# Patient Record
Sex: Male | Born: 1970
Health system: Southern US, Community
[De-identification: ages and names within clinical notes are randomized; demographics above are authoritative.]

## PROBLEM LIST (undated history)

## (undated) DIAGNOSIS — Z992 Dependence on renal dialysis: Secondary | ICD-10-CM

## (undated) DIAGNOSIS — N289 Disorder of kidney and ureter, unspecified: Secondary | ICD-10-CM

## (undated) DIAGNOSIS — K7460 Unspecified cirrhosis of liver: Secondary | ICD-10-CM

## (undated) DIAGNOSIS — K08109 Complete loss of teeth, unspecified cause, unspecified class: Secondary | ICD-10-CM

## (undated) DIAGNOSIS — I340 Nonrheumatic mitral (valve) insufficiency: Secondary | ICD-10-CM

## (undated) DIAGNOSIS — I10 Essential (primary) hypertension: Secondary | ICD-10-CM

## (undated) DIAGNOSIS — E8770 Fluid overload, unspecified: Secondary | ICD-10-CM

## (undated) DIAGNOSIS — E875 Hyperkalemia: Secondary | ICD-10-CM

## (undated) DIAGNOSIS — I5042 Chronic combined systolic (congestive) and diastolic (congestive) heart failure: Secondary | ICD-10-CM

## (undated) DIAGNOSIS — J811 Chronic pulmonary edema: Secondary | ICD-10-CM

## (undated) DIAGNOSIS — N492 Inflammatory disorders of scrotum: Secondary | ICD-10-CM

## (undated) DIAGNOSIS — R109 Unspecified abdominal pain: Secondary | ICD-10-CM

## (undated) DIAGNOSIS — N186 End stage renal disease: Secondary | ICD-10-CM

## (undated) DIAGNOSIS — R079 Chest pain, unspecified: Secondary | ICD-10-CM

## (undated) HISTORY — PX: AV FISTULA PLACEMENT: SHX1204

## (undated) HISTORY — DX: Essential (primary) hypertension: I10

## (undated) HISTORY — DX: Inflammatory disorders of scrotum: N49.2

## (undated) HISTORY — DX: Fluid overload, unspecified: E87.70

## (undated) HISTORY — DX: Chronic pulmonary edema: J81.1

## (undated) HISTORY — DX: Hyperkalemia: E87.5

## (undated) HISTORY — DX: Unspecified abdominal pain: R10.9

## (undated) HISTORY — DX: End stage renal disease: N18.6

## (undated) HISTORY — DX: Dependence on renal dialysis: Z99.2

## (undated) HISTORY — DX: Chest pain, unspecified: R07.9

---

## 2015-01-25 ENCOUNTER — Inpatient Hospital Stay
Admission: EM | Admit: 2015-01-25 | Discharge: 2015-01-28 | DRG: 727 | Disposition: A | Discharge status: Home / Self Care | Payer: Medicaid Other | Attending: Internal Medicine | Admitting: Internal Medicine

## 2015-01-25 ENCOUNTER — Encounter: Payer: Self-pay | Admitting: Urgent Care

## 2015-01-25 ENCOUNTER — Emergency Department: Payer: Medicaid Other

## 2015-01-25 DIAGNOSIS — Z9889 Other specified postprocedural states: Secondary | ICD-10-CM

## 2015-01-25 DIAGNOSIS — E871 Hypo-osmolality and hyponatremia: Secondary | ICD-10-CM | POA: Diagnosis present

## 2015-01-25 DIAGNOSIS — N508 Other specified disorders of male genital organs: Secondary | ICD-10-CM | POA: Diagnosis present

## 2015-01-25 DIAGNOSIS — I120 Hypertensive chronic kidney disease with stage 5 chronic kidney disease or end stage renal disease: Secondary | ICD-10-CM | POA: Diagnosis present

## 2015-01-25 DIAGNOSIS — F17210 Nicotine dependence, cigarettes, uncomplicated: Secondary | ICD-10-CM | POA: Diagnosis present

## 2015-01-25 DIAGNOSIS — N492 Inflammatory disorders of scrotum: Secondary | ICD-10-CM

## 2015-01-25 DIAGNOSIS — Z79899 Other long term (current) drug therapy: Secondary | ICD-10-CM | POA: Diagnosis not present

## 2015-01-25 DIAGNOSIS — R609 Edema, unspecified: Secondary | ICD-10-CM

## 2015-01-25 DIAGNOSIS — N186 End stage renal disease: Secondary | ICD-10-CM | POA: Diagnosis present

## 2015-01-25 DIAGNOSIS — Z841 Family history of disorders of kidney and ureter: Secondary | ICD-10-CM | POA: Diagnosis not present

## 2015-01-25 DIAGNOSIS — Z992 Dependence on renal dialysis: Secondary | ICD-10-CM

## 2015-01-25 DIAGNOSIS — D631 Anemia in chronic kidney disease: Secondary | ICD-10-CM | POA: Diagnosis present

## 2015-01-25 DIAGNOSIS — N2581 Secondary hyperparathyroidism of renal origin: Secondary | ICD-10-CM | POA: Diagnosis present

## 2015-01-25 HISTORY — DX: Disorder of kidney and ureter, unspecified: N28.9

## 2015-01-25 HISTORY — DX: Inflammatory disorders of scrotum: N49.2

## 2015-01-25 HISTORY — DX: End stage renal disease: N18.6

## 2015-01-25 HISTORY — DX: Dependence on renal dialysis: Z99.2

## 2015-01-25 LAB — CBC WITH DIFFERENTIAL/PLATELET
Basophils Absolute: 0 10*3/uL (ref 0–0.1)
Basophils Relative: 0 %
Eosinophils Absolute: 1.7 10*3/uL — ABNORMAL HIGH (ref 0–0.7)
Eosinophils Relative: 15 %
HCT: 37.3 % — ABNORMAL LOW (ref 40.0–52.0)
HEMOGLOBIN: 12.6 g/dL — AB (ref 13.0–18.0)
LYMPHS ABS: 0.9 10*3/uL — AB (ref 1.0–3.6)
Lymphocytes Relative: 8 %
MCH: 33.7 pg (ref 26.0–34.0)
MCHC: 33.7 g/dL (ref 32.0–36.0)
MCV: 99.9 fL (ref 80.0–100.0)
MONOS PCT: 9 %
Monocytes Absolute: 1 10*3/uL (ref 0.2–1.0)
NEUTROS ABS: 7.9 10*3/uL — AB (ref 1.4–6.5)
NEUTROS PCT: 68 %
Platelets: 107 10*3/uL — ABNORMAL LOW (ref 150–440)
RBC: 3.73 MIL/uL — AB (ref 4.40–5.90)
RDW: 14.8 % — ABNORMAL HIGH (ref 11.5–14.5)
WBC: 11.5 10*3/uL — ABNORMAL HIGH (ref 3.8–10.6)

## 2015-01-25 LAB — COMPREHENSIVE METABOLIC PANEL
ALT: 20 U/L (ref 17–63)
AST: 21 U/L (ref 15–41)
Albumin: 4 g/dL (ref 3.5–5.0)
Alkaline Phosphatase: 174 U/L — ABNORMAL HIGH (ref 38–126)
Anion gap: 17 — ABNORMAL HIGH (ref 5–15)
BUN: 54 mg/dL — AB (ref 6–20)
CALCIUM: 9.3 mg/dL (ref 8.9–10.3)
CHLORIDE: 87 mmol/L — AB (ref 101–111)
CO2: 26 mmol/L (ref 22–32)
CREATININE: 11.72 mg/dL — AB (ref 0.61–1.24)
GFR calc non Af Amer: 5 mL/min — ABNORMAL LOW (ref >60)
GFR, EST AFRICAN AMERICAN: 5 mL/min — AB (ref >60)
Glucose, Bld: 98 mg/dL (ref 65–99)
Potassium: 4.4 mmol/L (ref 3.5–5.1)
Sodium: 130 mmol/L — ABNORMAL LOW (ref 135–145)
Total Bilirubin: 1.8 mg/dL — ABNORMAL HIGH (ref 0.3–1.2)
Total Protein: 8.1 g/dL (ref 6.5–8.1)

## 2015-01-25 MED ORDER — SODIUM CHLORIDE 0.9 % IV SOLN
3.0000 g | Freq: Four times a day (QID) | INTRAVENOUS | Status: DC
  Filled 2015-01-25 (×5): qty 3

## 2015-01-25 MED ORDER — HEPARIN SODIUM (PORCINE) 5000 UNIT/ML IJ SOLN
5000.0000 [IU] | Freq: Three times a day (TID) | INTRAMUSCULAR | Status: DC
  Administered 2015-01-25 – 2015-01-28 (×8): 5000 [IU] via SUBCUTANEOUS
  Filled 2015-01-25 (×8): qty 1

## 2015-01-25 MED ORDER — HYDROMORPHONE HCL 1 MG/ML IJ SOLN
1.0000 mg | Freq: Once | INTRAMUSCULAR | Status: AC
  Administered 2015-01-25: 1 mg via INTRAVENOUS

## 2015-01-25 MED ORDER — ACETAMINOPHEN 650 MG RE SUPP
650.0000 mg | Freq: Four times a day (QID) | RECTAL | Status: DC | PRN
Start: 2015-01-25 — End: 2015-01-28

## 2015-01-25 MED ORDER — HYDROCODONE-ACETAMINOPHEN 5-325 MG PO TABS
1.0000 | ORAL_TABLET | ORAL | Status: DC | PRN
  Administered 2015-01-25 – 2015-01-26 (×5): 2 via ORAL
  Administered 2015-01-27: 1 via ORAL
  Administered 2015-01-28: 2 via ORAL
  Filled 2015-01-25: qty 1
  Filled 2015-01-25: qty 2
  Filled 2015-01-25: qty 1
  Filled 2015-01-25 (×5): qty 2

## 2015-01-25 MED ORDER — HYDROMORPHONE HCL 1 MG/ML IJ SOLN
INTRAMUSCULAR | Status: AC
  Administered 2015-01-25: 1 mg via INTRAVENOUS
  Filled 2015-01-25: qty 1

## 2015-01-25 MED ORDER — MORPHINE SULFATE 2 MG/ML IJ SOLN
1.0000 mg | INTRAMUSCULAR | Status: DC | PRN
  Administered 2015-01-25 – 2015-01-28 (×9): 2 mg via INTRAVENOUS
  Filled 2015-01-25 (×10): qty 1

## 2015-01-25 MED ORDER — VANCOMYCIN HCL IN DEXTROSE 1-5 GM/200ML-% IV SOLN
1000.0000 mg | Freq: Once | INTRAVENOUS | Status: DC
  Filled 2015-01-25: qty 200

## 2015-01-25 MED ORDER — SODIUM CHLORIDE 0.9 % IV SOLN: INTRAVENOUS | Status: AC

## 2015-01-25 MED ORDER — ACETAMINOPHEN 325 MG PO TABS: 650.0000 mg | ORAL_TABLET | Freq: Four times a day (QID) | ORAL | Status: DC | PRN

## 2015-01-25 MED ORDER — CLINDAMYCIN PHOSPHATE 600 MG/50ML IV SOLN
600.0000 mg | Freq: Once | INTRAVENOUS | Status: AC
  Administered 2015-01-25: 600 mg via INTRAVENOUS

## 2015-01-25 MED ORDER — AMPICILLIN-SULBACTAM SODIUM 3 (2-1) G IJ SOLR
3.0000 g | INTRAMUSCULAR | Status: DC
  Administered 2015-01-25 – 2015-01-26 (×2): 3 g via INTRAVENOUS
  Filled 2015-01-25 (×3): qty 3

## 2015-01-25 MED ORDER — VANCOMYCIN HCL IN DEXTROSE 750-5 MG/150ML-% IV SOLN
750.0000 mg | INTRAVENOUS | Status: DC
  Administered 2015-01-27: 750 mg via INTRAVENOUS
  Filled 2015-01-25: qty 150

## 2015-01-25 MED ORDER — VANCOMYCIN HCL 10 G IV SOLR
1500.0000 mg | Freq: Once | INTRAVENOUS | Status: AC
  Administered 2015-01-25: 1500 mg via INTRAVENOUS
  Filled 2015-01-25: qty 1500

## 2015-01-25 MED ORDER — ONDANSETRON HCL 4 MG/2ML IJ SOLN
4.0000 mg | Freq: Four times a day (QID) | INTRAMUSCULAR | Status: DC | PRN
  Administered 2015-01-28: 4 mg via INTRAVENOUS
  Filled 2015-01-25: qty 2

## 2015-01-25 MED ORDER — CLINDAMYCIN PHOSPHATE 600 MG/50ML IV SOLN
INTRAVENOUS | Status: AC
  Administered 2015-01-25: 600 mg via INTRAVENOUS
  Filled 2015-01-25: qty 50

## 2015-01-25 MED ORDER — ONDANSETRON HCL 4 MG PO TABS: 4.0000 mg | ORAL_TABLET | Freq: Four times a day (QID) | ORAL | Status: DC | PRN

## 2015-01-25 NOTE — H&P (Signed)
[facility] [place] NAME: Mario [name]    MR#:  [id number]  DATE OF BIRTH:  1971-01-30  DATE OF ADMISSION:  01/25/2015  PRIMARY CARE PHYSICIAN:[facility]  REQUESTING/REFERRING PHYSICIAN: Lenise [name]  CHIEF COMPLAINT:   Chief Complaint  Patient presents with  . Abscess    HISTORY OF PRESENT ILLNESS: Mario [name]  is a 44 y.o. male with a known history of  end-stage renal disease on dialysis on Tuesday Thursday and Saturday. Patient reports that he developed a scrotal swelling approximately 3 weeks ago but still really started hurting 3 days ago. Came to the ED with the symptoms. Patient had a ultrasound of the scrotal area which showed a 2.1 cm heterogeneous hypoechoic collection compatible with resolving abscesses. Patient does have an area of a boil on his scrotum that is draining some pus. He also complains of warmth and pain in that area. Has had intermittent fevers and chills denies any chest pain or palpitations PAST MEDICAL HISTORY:   Past Medical History  Diagnosis Date  . Renal disorder   . Dialysis patient     PAST SURGICAL HISTORY:  Past Surgical History  Procedure Laterality Date  . Av fistula placement      SOCIAL HISTORY:  History  Substance Use Topics  . Smoking status: Current Every Day Smoker -- 0.25 packs/day    Types: Cigarettes  . Smokeless tobacco: Not on file  . Alcohol Use: No    FAMILY HISTORY:  Family History  Problem Relation Age of Onset  . Kidney failure Father     DRUG ALLERGIES: No Known Allergies  REVIEW OF SYSTEMS:   CONSTITUTIONAL: Positive fever, fatigue or weakness.  EYES: No blurred or double vision.  EARS, NOSE, AND THROAT: No tinnitus or ear pain.  RESPIRATORY: No cough, shortness of breath, wheezing or hemoptysis.  CARDIOVASCULAR: No chest pain, orthopnea, edema.  GASTROINTESTINAL: No nausea, vomiting, diarrhea or abdominal pain.   GENITOURINARY: No dysuria, hematuria.  ENDOCRINE: No polyuria, nocturia,  HEMATOLOGY: No anemia, easy bruising or bleeding SKIN: No rash or lesion. Scrotal swelling MUSCULOSKELETAL: No joint pain or arthritis.   NEUROLOGIC: No tingling, numbness, weakness.  PSYCHIATRY: No anxiety or depression.   MEDICATIONS AT HOME:  Prior to Admission medications   Medication Sig Start Date End Date Taking? Authorizing Provider  calcium acetate (PHOSLO) 667 MG tablet Take 667 mg by mouth 3 (three) times daily with meals.   Yes Historical Provider, MD  lanthanum (FOSRENOL) 1000 MG chewable tablet Chew 1,000 mg by mouth daily.   Yes Historical Provider, MD  Multiple Vitamin (RENAL MULTIVITAMIN/ZINC PO) Take 1 tablet by mouth daily.   Yes Historical Provider, MD      PHYSICAL EXAMINATION:   VITAL SIGNS: Blood pressure 131/73, pulse 81, temperature 98.3 F (36.8 C), temperature source Oral, resp. rate 18, weight 72.576 kg (160 lb), SpO2 100 %.  GENERAL:  44 y.o.-year-old patient lying in the bed with no acute distress.  EYES: Pupils equal, round, reactive to light and accommodation. No scleral icterus. Extraocular muscles intact.  HEENT: Head atraumatic, normocephalic. Oropharynx and nasopharynx clear.  NECK:  Supple, no jugular venous distention. No thyroid enlargement, no tenderness.  LUNGS: Normal breath sounds bilaterally, no wheezing, rales,rhonchi or crepitation. No use of accessory muscles of respiration.  CARDIOVASCULAR: S1, S2 normal. No murmurs, rubs, or gallops.  ABDOMEN: Soft, nontender, nondistended. Bowel sounds present. No organomegaly or mass.  EXTREMITIES: No pedal edema,  cyanosis, or clubbing.  NEUROLOGIC: Cranial nerves II through XII are intact. Muscle strength 5/5 in all extremities. Sensation intact. Gait not checked.  PSYCHIATRIC: The patient is alert and oriented x 3.  SKIN: No obvious rash, lesion, or ulcer. GU: Has diffuse swelling of his scrotum, with area of boil that is  open and draining some pus there is warmth   LABORATORY PANEL:   CBC  Recent Labs Lab 01/25/15 0846  WBC 11.5*  HGB 12.6*  HCT 37.3*  PLT 107*  MCV 99.9  MCH 33.7  MCHC 33.7  RDW 14.8*  LYMPHSABS 0.9*  MONOABS 1.0  EOSABS 1.7*  BASOSABS 0.0   ------------------------------------------------------------------------------------------------------------------  Chemistries   Recent Labs Lab 01/25/15 0846  NA 130*  K 4.4  CL 87*  CO2 26  GLUCOSE 98  BUN 54*  CREATININE 11.72*  CALCIUM 9.3  AST 21  ALT 20  ALKPHOS 174*  BILITOT 1.8*   ------------------------------------------------------------------------------------------------------------------ CrCl cannot be calculated (Unknown ideal weight.). ------------------------------------------------------------------------------------------------------------------ No results for input(s): TSH, T4TOTAL, T3FREE, THYROIDAB in the last 72 hours.  Invalid input(s): FREET3   Coagulation profile No results for input(s): INR, PROTIME in the last 168 hours. ------------------------------------------------------------------------------------------------------------------- No results for input(s): DDIMER in the last 72 hours. -------------------------------------------------------------------------------------------------------------------  Cardiac Enzymes No results for input(s): CKMB, TROPONINI, MYOGLOBIN in the last 168 hours.  Invalid input(s): CK ------------------------------------------------------------------------------------------------------------------ Invalid input(s): POCBNP  ---------------------------------------------------------------------------------------------------------------  Urinalysis No results found for: COLORURINE, APPEARANCEUR, LABSPEC, PHURINE, GLUCOSEU, HGBUR, BILIRUBINUR, KETONESUR, PROTEINUR, UROBILINOGEN, NITRITE, LEUKOCYTESUR   RADIOLOGY: US Scrotum  01/25/2015   CLINICAL DATA:   Palpable nodule. Scrotal abscess. The patient states he has been treated with antibiotics.  EXAM: SCROTAL ULTRASOUND  DOPPLER ULTRASOUND OF THE TESTICLES  TECHNIQUE: Complete ultrasound examination of the testicles, epididymis, and other scrotal structures was performed. Color and spectral Doppler ultrasound were also utilized to evaluate blood flow to the testicles.  COMPARISON:  None.  FINDINGS: Right testicle  Measurements: 3.7 x 1.8 x 2.8 cm, within normal limits. No mass or microlithiasis visualized.  Left testicle  Measurements: 4.0 x 2.1 x 2.9 cm, within normal limits. No mass or microlithiasis visualized.  Right epididymis:  Normal in size and appearance.  Left epididymis:  Normal in size and appearance.  Hydrocele:  A small right hydrocele is noted.  Varicocele:  None visualized.  Pulsed Doppler interrogation of both testes demonstrates normal low resistance arterial and venous waveforms bilaterally.  Lateral to the right test skull is a heterogeneous to hypoechoic collection measuring at 2.1 x 1.1 x 1.4 cm. There is marked thickening of the scrotal wall. There is no significant increased flow about the collection.  IMPRESSION: 1. Normal appearance of the testicles bilaterally. Color Doppler evaluation and waveforms are normal. 2. Small right hydrocele.  This may be reactive. 3. 2.1 cm heterogeneous hypoechoic collection compatible with a resolving abscess. There is no significant hyperemia surrounding the area. 4. Scrotal wall thickening bilaterally is likely reactive.   Electronically Signed   By: San Morelle M.D.   On: 01/25/2015 10:19   [place] Art/ven Flow Abd Pelv Doppler  01/25/2015   CLINICAL DATA:  Palpable nodule. Scrotal abscess. The patient states he has been treated with antibiotics.  EXAM: SCROTAL ULTRASOUND  DOPPLER ULTRASOUND OF THE TESTICLES  TECHNIQUE: Complete ultrasound examination of the testicles, epididymis, and other scrotal structures was performed. Color and spectral Doppler  ultrasound were also utilized to evaluate blood flow to the testicles.  COMPARISON:  None.  FINDINGS: Right testicle  Measurements:  3.7 x 1.8 x 2.8 cm, within normal limits. No mass or microlithiasis visualized.  Left testicle  Measurements: 4.0 x 2.1 x 2.9 cm, within normal limits. No mass or microlithiasis visualized.  Right epididymis:  Normal in size and appearance.  Left epididymis:  Normal in size and appearance.  Hydrocele:  A small right hydrocele is noted.  Varicocele:  None visualized.  Pulsed Doppler interrogation of both testes demonstrates normal low resistance arterial and venous waveforms bilaterally.  Lateral to the right test skull is a heterogeneous to hypoechoic collection measuring at 2.1 x 1.1 x 1.4 cm. There is marked thickening of the scrotal wall. There is no significant increased flow about the collection.  IMPRESSION: 1. Normal appearance of the testicles bilaterally. Color Doppler evaluation and waveforms are normal. 2. Small right hydrocele.  This may be reactive. 3. 2.1 cm heterogeneous hypoechoic collection compatible with a resolving abscess. There is no significant hyperemia surrounding the area. 4. Scrotal wall thickening bilaterally is likely reactive.   Electronically Signed   By: San Morelle M.D.   On: 01/25/2015 10:19    EKG: No orders found for this or any previous visit.  IMPRESSION AND PLAN: Patient is a 44 year old Spanish-speaking male presents with scrotal swelling  1. Scrotal swelling due to scrotal cellulitis associated with abscess. At this time will treat him with aggressive anabiotic's with IV vancomycin and Unasyn. It is already draining on his own if he does not improve then we can ask urology to see the patient.  2. End-stage renal disease: Nephrology consult for hemodialysis  3. Miscellaneous heparin for DVT prophylaxis     All the records are reviewed and case discussed with ED provider. Management plans discussed with the patient,  family and they are in agreement.  CODE STATUS: Full    TOTAL TIME TAKING CARE OF THIS PATIENT: 55 minutes.  Spanish interpreter use to communicate with the patient   Dustin Flock M.D on 01/25/2015 at 11:34 AM  Between 7am to 6pm - Pager - [phone]  After 6pm go to [email or web address] - password [email or web address] [facility]  [facility]  Office  [phone]  CC: Primary care physician; No PCP Per Patient

## 2015-01-25 NOTE — Progress Notes (Addendum)
ANTIBIOTIC CONSULT NOTE - INITIAL  Pharmacy Consult for Vancomycin/Unasyn Indication: scrotal cellulitis/abscess  No Known Allergies  Patient Measurements: Height: 5' (152.4 cm) Weight: 172 lb 9.6 oz (78.291 kg) IBW/kg (Calculated) : 50 Adjusted Body Weight: 61kg  Labs:  Recent Labs  01/25/15 0846  WBC 11.5*  HGB 12.6*  PLT 107*  CREATININE 11.72*   Estimated Creatinine Clearance: 7 mL/min (by C-G formula based on Cr of 11.72).   Microbiology: No results found for this or any previous visit (from the past 720 hour(s)).  Medical History: Past Medical History  Diagnosis Date  . Renal disorder   . Dialysis patient     Medications:  Anti-infectives    Start     Dose/Rate Route Frequency Ordered Stop   01/27/15 1200  vancomycin (VANCOCIN) IVPB 750 mg/150 ml premix     750 mg 150 mL/hr over 60 Minutes Intravenous Every T-Th-Sa (Hemodialysis) 01/25/15 1523     01/25/15 1800  Ampicillin-Sulbactam (UNASYN) 3 g in sodium chloride 0.9 % 100 mL IVPB     3 g 100 mL/hr over 60 Minutes Intravenous Every 24 hours 01/25/15 1526     01/25/15 1600  vancomycin (VANCOCIN) 1,500 mg in sodium chloride 0.9 % 500 mL IVPB     1,500 mg 250 mL/hr over 120 Minutes Intravenous  Once 01/25/15 1523     01/25/15 1300  vancomycin (VANCOCIN) IVPB 1000 mg/200 mL premix  Status:  Discontinued     1,000 mg 200 mL/hr over 60 Minutes Intravenous  Once 01/25/15 1228 01/25/15 1515   01/25/15 1130  Ampicillin-Sulbactam (UNASYN) 3 g in sodium chloride 0.9 % 100 mL IVPB  Status:  Discontinued     3 g 100 mL/hr over 60 Minutes Intravenous Every 6 hours 01/25/15 1129 01/25/15 1526   01/25/15 0900  clindamycin (CLEOCIN) IVPB 600 mg     600 mg 100 mL/hr over 30 Minutes Intravenous  Once 01/25/15 0859 01/25/15 0941     Assessment: Pharmacy consulted to dose vancomycin for scrotal abscess/cellulitis in this 44 year old male with ESRD on HD.   Patient on T,Th,Sat HD schedule as an outpatient. Unclear when  next HD session is scheduled.  Plan:  Vancomycin 1500mg  IV x 1 ordered followed by 750mg  IV QHD. Current orders for TThSat schedule, will need to follow and adjust schedule as needed. Will check trough prior to third planned HD session. Target trough 15-83mcg/ml  Current orders for unasyn 3gm IV Q6H. Will adjust per policy for renal function to unasyn 3gm IV Q24H with dose to be given after HD on dialysis days.  Pharmacy to follow per consult  Rexene Edison, PharmD Clinical Pharmacist  01/25/2015,3:29 PM

## 2015-01-25 NOTE — ED Notes (Signed)
Patient presents with reports of an abscess to his RIGHT groin x 3 weeks; worse over the last 3 days. Denies drainage. (+) fever at home.

## 2015-01-25 NOTE — ED Notes (Signed)
Pt arrives with complaints of testicle pain for 3 weeks, pt in intense pain, upon assessment testicle large and inflammed, Ronnald Collum spanish interpreter at bedside, pt on dialysis, fistula Left upper arm- tue, thur, sat, pt states he does not make urine, pt states he was treated for abscess in testicle in [place] and given abx

## 2015-01-25 NOTE — ED Provider Notes (Signed)
[facility] Emergency Department Provider Note  ____________________________________________  Time seen: 0830  I have reviewed the triage vital signs and the nursing notes.   HISTORY  Chief Complaint Abscess  Patient is primarily a Spanish speaker information was given through an interpreter  HPI Mario [name] is a 44 y.o. male that arrives here today with swelling to his scrotum states that he believes he has an abscessed gotten worse over the last 3 weeks growing of the last 2 days states the pain has been unbearable used prescribing fevers sweats chills at home is also dialysis patient didn't make it to dialysis rates his pain as a 10 out of 10 nothing making it better or worse denies any other complaints at this time    Past Medical History  Diagnosis Date  . Renal disorder   . Dialysis patient     Patient Active Problem List   Diagnosis Date Noted  . Scrotal infection 01/25/2015  . ESRD (end stage renal disease) 01/25/2015    Past Surgical History  Procedure Laterality Date  . Av fistula placement      Current Outpatient Rx  Name  Route  Sig  Dispense  Refill  . calcium acetate (PHOSLO) 667 MG tablet   Oral   Take 667 mg by mouth 3 (three) times daily with meals.         Marland Kitchen lanthanum (FOSRENOL) 1000 MG chewable tablet   Oral   Chew 1,000 mg by mouth daily.         . Multiple Vitamin (RENAL MULTIVITAMIN/ZINC PO)   Oral   Take 1 tablet by mouth daily.           Allergies Review of patient's allergies indicates no known allergies.  Family History  Problem Relation Age of Onset  . Kidney failure Father     Social History History  Substance Use Topics  . Smoking status: Current Every Day Smoker -- 0.25 packs/day    Types: Cigarettes  . Smokeless tobacco: Not on file  . Alcohol Use: No    Review of Systems Constitutional:  fever/chills Eyes: No visual changes. ENT: No sore throat. Cardiovascular: Denies chest  pain. Respiratory: Denies shortness of breath. Gastrointestinal: No abdominal pain.  No nausea, no vomiting.  No diarrhea.  No constipation. Genitourinary: Negative for dysuria. Musculoskeletal: Negative for back pain. Skin: Negative for rash. Neurological: Negative for headaches, focal weakness or numbness.  10-point ROS otherwise negative.  ____________________________________________   PHYSICAL EXAM:  VITAL SIGNS: ED Triage Vitals  Enc Vitals Group     BP 01/25/15 0653 147/78 mmHg     Pulse Rate 01/25/15 0653 78     Resp 01/25/15 0653 18     Temp 01/25/15 0653 98.3 F (36.8 C)     Temp Source 01/25/15 0653 Oral     SpO2 01/25/15 0653 100 %     Weight 01/25/15 0653 160 lb (72.576 kg)     Height --      Head Cir --      Peak Flow --      Pain Score 01/25/15 0654 10     Pain Loc --      Pain Edu? --      Excl. in [place]? --     Constitutional: Alert and oriented. Well appearing and in no acute distress. Eyes: Conjunctivae are normal. PERRL. EOMI. Head: Atraumatic. Nose: No congestion/rhinnorhea. Mouth/Throat: Mucous membranes are moist.  Oropharynx non-erythematous. Neck: No stridor.   Cardiovascular:  Normal rate, regular rhythm. Grossly normal heart sounds.  Good peripheral circulation. Respiratory: Normal respiratory effort.  No retractions. Lungs CTAB. Gastrointestinal: Soft and nontender. No distention. No abdominal bruits. No CVA tenderness. Genitourinary: Large swelling of the scrotum warm red tenderness palpable fluid collection Musculoskeletal: No lower extremity tenderness nor edema.  No joint effusions. Patient has fistula in his upper left arm Neurologic:  Normal speech and language. No gross focal neurologic deficits are appreciated. Speech is normal. No gait instability. Skin:  Skin is warm, dry and intact. No rash noted. Psychiatric: Mood and affect are normal. Speech and behavior are normal.  ____________________________________________   LABS (all labs  ordered are listed, but only abnormal results are displayed)  Labs Reviewed  CBC WITH DIFFERENTIAL/PLATELET - Abnormal; Notable for the following:    WBC 11.5 (*)    RBC 3.73 (*)    Hemoglobin 12.6 (*)    HCT 37.3 (*)    RDW 14.8 (*)    Platelets 107 (*)    Neutro Abs 7.9 (*)    Lymphs Abs 0.9 (*)    Eosinophils Absolute 1.7 (*)    All other components within normal limits  COMPREHENSIVE METABOLIC PANEL - Abnormal; Notable for the following:    Sodium 130 (*)    Chloride 87 (*)    BUN 54 (*)    Creatinine, Ser 11.72 (*)    Alkaline Phosphatase 174 (*)    Total Bilirubin 1.8 (*)    GFR calc non Af Amer 5 (*)    GFR calc Af Amer 5 (*)    Anion gap 17 (*)    All other components within normal limits  CULTURE, BLOOD (ROUTINE X 2)  CULTURE, BLOOD (ROUTINE X 2)    RADIOLOGY  IMPRESSION: 1. Normal appearance of the testicles bilaterally. Color Doppler evaluation and waveforms are normal. 2. Small right hydrocele. This may be reactive. 3. 2.1 cm heterogeneous hypoechoic collection compatible with a resolving abscess. There is no significant hyperemia surrounding the area. 4. Scrotal wall thickening bilaterally is likely reactive.   Electronically Signed By: San Morelle M.D. On: 01/25/2015 10:19 ____________________________________________   PROCEDURES  Procedure(s) performed: None  Critical Care performed: No  ____________________________________________   INITIAL IMPRESSION / ASSESSMENT AND PLAN / ED COURSE  Pertinent labs & imaging results that were available during my care of the patient were reviewed by me and considered in my medical decision making (see chart for details).  Initial impression on this patient scrotal abscess and cellulitis patient has had this problem before states he required IV antibiotics and admission is also a dialysis patient who was unable to go to dialysis due to this condition given all these factors we have consulted  the hospitalist service for evaluation for admission on the department he received 2 mg of Dilaudid 600 mg of clindamycin plan for this patient is evaluation for admission ____________________________________________   FINAL CLINICAL IMPRESSION(S) / ED DIAGNOSES  Final diagnoses:  Swelling  Cellulitis of scrotum  Abscess of scrotum     Crystalmarie Yasin Verdene Rio, PA-C 01/25/15 1210  Earleen Newport, MD 01/25/15 1425

## 2015-01-25 NOTE — ED Provider Notes (Signed)
Patient was seen by me, has obvious scrotal infection, possible abscess. On exam there is a tender area on the right hemiscrotum concerning for abscess, but ultrasound does not show a drainable abscess at this time. He was given IV antibiotics and due to his significant medical problems would benefit from IV antibiotics and admission. He may need urology consultation if not improving.  Medical screening examination/treatment/procedure(s) were performed by non-physician practitioner and as supervising physician I was immediately available for consultation/collaboration.    Earleen Newport, MD 01/25/15 [phone]

## 2015-01-26 LAB — BASIC METABOLIC PANEL
Anion gap: 15 (ref 5–15)
BUN: 69 mg/dL — AB (ref 6–20)
CO2: 25 mmol/L (ref 22–32)
CREATININE: 13.71 mg/dL — AB (ref 0.61–1.24)
Calcium: 8.8 mg/dL — ABNORMAL LOW (ref 8.9–10.3)
Chloride: 89 mmol/L — ABNORMAL LOW (ref 101–111)
GFR calc Af Amer: 4 mL/min — ABNORMAL LOW (ref >60)
GFR calc non Af Amer: 4 mL/min — ABNORMAL LOW (ref >60)
Glucose, Bld: 90 mg/dL (ref 65–99)
Potassium: 5.2 mmol/L — ABNORMAL HIGH (ref 3.5–5.1)
Sodium: 129 mmol/L — ABNORMAL LOW (ref 135–145)

## 2015-01-26 LAB — CBC
HEMATOCRIT: 34.9 % — AB (ref 40.0–52.0)
HEMOGLOBIN: 11.7 g/dL — AB (ref 13.0–18.0)
MCH: 33.6 pg (ref 26.0–34.0)
MCHC: 33.6 g/dL (ref 32.0–36.0)
MCV: 100.1 fL — ABNORMAL HIGH (ref 80.0–100.0)
Platelets: 103 10*3/uL — ABNORMAL LOW (ref 150–440)
RBC: 3.49 MIL/uL — ABNORMAL LOW (ref 4.40–5.90)
RDW: 14.7 % — ABNORMAL HIGH (ref 11.5–14.5)
WBC: 10.5 10*3/uL (ref 3.8–10.6)

## 2015-01-26 LAB — HEPATITIS B SURFACE ANTIGEN: Hepatitis B Surface Ag: NEGATIVE

## 2015-01-26 MED ORDER — VANCOMYCIN HCL IN DEXTROSE 750-5 MG/150ML-% IV SOLN
750.0000 mg | Freq: Once | INTRAVENOUS | Status: AC
  Administered 2015-01-26: 750 mg via INTRAVENOUS
  Filled 2015-01-26: qty 150

## 2015-01-26 NOTE — Progress Notes (Signed)
ANTIBIOTIC CONSULT NOTE - INITIAL  Pharmacy Consult for Vancomycin/Unasyn Indication: scrotal cellulitis/abscess  No Known Allergies  Patient Measurements: Height: 5' (152.4 cm) Weight: 172 lb 9.9 oz (78.3 kg) IBW/kg (Calculated) : 50 Adjusted Body Weight: 61kg  Labs:  Recent Labs  01/25/15 0846 01/26/15 0533  WBC 11.5* 10.5  HGB 12.6* 11.7*  PLT 107* 103*  CREATININE 11.72* 13.71*   Estimated Creatinine Clearance: 6 mL/min (by C-G formula based on Cr of 13.71).   Microbiology: Recent Results (from the past 720 hour(s))  Culture, blood (routine x 2)     Status: None (Preliminary result)   Collection Time: 01/25/15  8:47 AM  Result Value Ref Range Status   Specimen Description BLOOD  Final   Special Requests NONE  Final   Culture NO GROWTH 1 DAY  Final   Report Status PENDING  Incomplete  Culture, blood (routine x 2)     Status: None (Preliminary result)   Collection Time: 01/25/15  8:47 AM  Result Value Ref Range Status   Specimen Description BLOOD  Final   Special Requests NONE  Final   Culture NO GROWTH 1 DAY  Final   Report Status PENDING  Incomplete    Medical History: Past Medical History  Diagnosis Date  . Renal disorder   . Dialysis patient     Medications:  Anti-infectives    Start     Dose/Rate Route Frequency Ordered Stop   01/27/15 1200  vancomycin (VANCOCIN) IVPB 750 mg/150 ml premix     750 mg 150 mL/hr over 60 Minutes Intravenous Every T-Th-Sa (Hemodialysis) 01/25/15 1523     01/25/15 1800  Ampicillin-Sulbactam (UNASYN) 3 g in sodium chloride 0.9 % 100 mL IVPB     3 g 100 mL/hr over 60 Minutes Intravenous Every 24 hours 01/25/15 1526     01/25/15 1600  vancomycin (VANCOCIN) 1,500 mg in sodium chloride 0.9 % 500 mL IVPB     1,500 mg 250 mL/hr over 120 Minutes Intravenous  Once 01/25/15 1523 01/25/15 1842   01/25/15 1300  vancomycin (VANCOCIN) IVPB 1000 mg/200 mL premix  Status:  Discontinued     1,000 mg 200 mL/hr over 60 Minutes  Intravenous  Once 01/25/15 1228 01/25/15 1515   01/25/15 1130  Ampicillin-Sulbactam (UNASYN) 3 g in sodium chloride 0.9 % 100 mL IVPB  Status:  Discontinued     3 g 100 mL/hr over 60 Minutes Intravenous Every 6 hours 01/25/15 1129 01/25/15 1526   01/25/15 0900  clindamycin (CLEOCIN) IVPB 600 mg     600 mg 100 mL/hr over 30 Minutes Intravenous  Once 01/25/15 0859 01/25/15 0941     Assessment: Pharmacy consulted to dose vancomycin for scrotal abscess/cellulitis in this 44 year old male with ESRD on HD.   Patient on T,Th,Sat HD schedule as an outpatient. Unclear when next HD session is scheduled.  Plan:  Vancomycin 1500mg  IV x 1 (given 6/23)followed by 750mg  IV QHD. Current orders for TThSat schedule, patient missed dialysis yesterday (6/23) and is to get dialysis on 6/24 and 6/25. Will order vancomycin 1g iv to be given after dialysis.  Unless otherwise clinically indicated,will obtain trough prior to dialysis on 6/28. Target trough 15-45mcg/ml  Will continue Unasyn 3gm IV Q24H with dose to be given after HD on dialysis days.  Pharmacy to monitor and adjust per consult.    Currie Paris, PharmD 01/26/2015,2:36 PM

## 2015-01-26 NOTE — Progress Notes (Signed)
[facility] [place] NAME: Mario [name]    MR#:  [id number]  DATE OF BIRTH:  [birth year]  SUBJECTIVE:  CHIEF COMPLAINT:   Chief Complaint  Patient presents with  . Abscess  seen at HD, no complaints other than pain around scrotal area. REVIEW OF SYSTEMS:  Review of Systems  Constitutional: Negative for fever, weight loss, malaise/fatigue and diaphoresis.  HENT: Negative for ear discharge, ear pain, hearing loss, nosebleeds, sore throat and tinnitus.   Eyes: Negative for blurred vision and pain.  Respiratory: Negative for cough, hemoptysis, shortness of breath and wheezing.   Cardiovascular: Negative for chest pain, palpitations, orthopnea and leg swelling.  Gastrointestinal: Negative for heartburn, nausea, vomiting, abdominal pain, diarrhea, constipation and blood in stool.  Genitourinary: Negative for dysuria, urgency and frequency.       Pain around scrotum and swelling  Musculoskeletal: Negative for myalgias and back pain.  Skin: Negative for itching and rash.  Neurological: Negative for dizziness, tingling, tremors, focal weakness, seizures, weakness and headaches.  Psychiatric/Behavioral: Negative for depression. The patient is not nervous/anxious.    DRUG ALLERGIES:  No Known Allergies VITALS:  Blood pressure 157/71, pulse 73, temperature 97.9 F (36.6 C), temperature source Oral, resp. rate 20, height 5' (1.524 m), weight 78.3 kg (172 lb 9.9 oz), SpO2 100 %. PHYSICAL EXAMINATION:  Physical Exam  Constitutional: He is oriented to person, place, and time and well-developed, well-nourished, and in no distress.  HENT:  Head: Normocephalic and atraumatic.  Eyes: Conjunctivae and EOM are normal. Pupils are equal, round, and reactive to light.  Neck: Normal range of motion. Neck supple. No tracheal deviation present. No thyromegaly present.  Cardiovascular: Normal rate, regular rhythm and normal heart sounds.    Pulmonary/Chest: Effort normal and breath sounds normal. No respiratory distress. He has no wheezes. He exhibits no tenderness.  Abdominal: Soft. Bowel sounds are normal. He exhibits no distension. There is no tenderness.  Genitourinary: He exhibits scrotal tenderness.  Has diffuse swelling of his scrotum, with area of boil that is open and draining minimal/no pus, there is warmth  Musculoskeletal: Normal range of motion.  Neurological: He is alert and oriented to person, place, and time. No cranial nerve deficit.  Skin: Skin is warm and dry. No rash noted.  Psychiatric: Mood and affect normal.   LABORATORY PANEL:   CBC  Recent Labs Lab 01/26/15 0533  WBC 10.5  HGB 11.7*  HCT 34.9*  PLT 103*   ------------------------------------------------------------------------------------------------------------------ Chemistries   Recent Labs Lab 01/25/15 0846 01/26/15 0533  NA 130* 129*  K 4.4 5.2*  CL 87* 89*  CO2 26 25  GLUCOSE 98 90  BUN 54* 69*  CREATININE 11.72* 13.71*  CALCIUM 9.3 8.8*  AST 21  --   ALT 20  --   ALKPHOS 174*  --   BILITOT 1.8*  --    ASSESSMENT AND PLAN:  Patient is a 44 year old Spanish-speaking male presents with scrotal swelling  1. Scrotal swelling due to scrotal cellulitis: doubt abscess. At this time continue aggressive anabiotic's with IV vancomycin and Unasyn. It is already draining on his own if he does not improve then we can ask urology to see the patient.  2. End-stage renal disease: Nephrology consult for hemodialysis  3. Anemia of Chronic Disease: stable  4. Hyponatremia: chronic, should get corrected with dialysis.  Miscellaneous heparin for DVT prophylaxis  All the records are reviewed and case discussed with Care Management/Social Workerr. Management  plans discussed with the patient, family and they are in agreement.  CODE STATUS: Full Code  TOTAL TIME TAKING CARE OF THIS PATIENT: 35 minutes.   More than 50% of the time was  spent in counseling/coordination of care: YES  POSSIBLE D/C IN 1-2 DAYS, DEPENDING ON CLINICAL CONDITION.   [facility], Leib Elahi M.D on 01/26/2015 at 2:45 PM  Between 7am to 6pm - Pager - [phone]  After 6pm go to [email or web address] - password [email or web address] [facility]  [place] [facility]  Office  [phone]  CC:  Primary care physician; No PCP Per Patient

## 2015-01-26 NOTE — Consult Note (Addendum)
[facility] CONSULT NOTE    Date: 01/26/2015                  Patient Name:  Mario [name]  MRN: [id number]  DOB: [birth year]  Age / Sex: 44 y.o., male         PCP: No PCP Per Patient                 Service Requesting Consult: Dustin Flock, MD                 Reason for Consult: ESRD and its management            History of Present Illness: Patient is a 44 y.o. male with a PMHx of end-stage renal disease on hemodialysis Tuesday, Thursday, Saturday, hypertension, anemia chronic kidney disease, secondary hyperparathyroidism, who was admitted to [facility] on 01/25/2015 for evaluation of scrotal swelling and abcess.  He reports that he has been having scrotal swelling over the past 3 weeks.  It recently began to drain.  He also states that there has been some associated redness of the scrotum.  We have been consulted for the evaluation management of end-stage renal disease.  The patient reports that he's been on dialysis for the past 7 years.  He was previously dialyzing in [place] but recently moved to [place] as his family is here.  He goes to [facility].  He has a left upper extremity AV fistula in place.  It is unclear who his outpatient nephrologist is.  He takes Fosrenol as well as PhosLo for management of secondary hyperparathyroidism.  It also appears that he has mild anemia chronic kidney disease with most recent hemoglobin of 11.7.  The patient has been started onintravenous antibiotics. Urology has not yet seen him.   Medications: Outpatient medications: Prescriptions prior to admission  Medication Sig Dispense Refill Last Dose  . calcium acetate (PHOSLO) 667 MG tablet Take 667 mg by mouth 3 (three) times daily with meals.   unknown  . lanthanum (FOSRENOL) 1000 MG chewable tablet Chew 1,000 mg by mouth daily.   unknown  . Multiple Vitamin (RENAL MULTIVITAMIN/ZINC PO) Take 1 tablet by mouth daily.   unknown    Current  medications: Current Facility-Administered Medications  Medication Dose Route Frequency Provider Last Rate Last Dose  . acetaminophen (TYLENOL) tablet 650 mg  650 mg Oral Q6H PRN Dustin Flock, MD       Or  . acetaminophen (TYLENOL) suppository 650 mg  650 mg Rectal Q6H PRN Dustin Flock, MD      . Ampicillin-Sulbactam (UNASYN) 3 g in sodium chloride 0.9 % 100 mL IVPB  3 g Intravenous Q24H Jody C Barefoot, RPH   3 g at 01/25/15 2014  . heparin injection 5,000 Units  5,000 Units Subcutaneous 3 times per day Dustin Flock, MD   5,000 Units at 01/26/15 0603  . HYDROcodone-acetaminophen (NORCO/VICODIN) 5-325 MG per tablet 1-2 tablet  1-2 tablet Oral Q4H PRN Dustin Flock, MD   2 tablet at 01/26/15 [phone]  . morphine 2 MG/ML injection 1-2 mg  1-2 mg Intravenous Q4H PRN Dustin Flock, MD   2 mg at 01/26/15 1346  . ondansetron (ZOFRAN) tablet 4 mg  4 mg Oral Q6H PRN Dustin Flock, MD       Or  . ondansetron (ZOFRAN) injection 4 mg  4 mg Intravenous Q6H PRN Dustin Flock, MD      . Derrill Memo ON 01/27/2015] vancomycin (VANCOCIN) IVPB  750 mg/150 ml premix  750 mg Intravenous Q T,Th,Sa-HD Jody C Barefoot, RPH          Allergies: No Known Allergies    Past Medical History: Past Medical History  Diagnosis Date  . Renal disorder   . Dialysis patient         SHPTH       Anemia of CKD       Hypertension   Past Surgical History: Past Surgical History  Procedure Laterality Date  . Av fistula placement       Family History: Family History  Problem Relation Age of Onset  . Kidney failure Father      Social History: History   Social History  . Marital Status: Single    Spouse Name: N/A  . Number of Children: N/A  . Years of Education: N/A   Occupational History  . Not on file.   Social History Main Topics  . Smoking status: Current Every Day Smoker -- 0.25 packs/day    Types: Cigarettes  . Smokeless tobacco: Not on file  . Alcohol Use: No  . Drug Use: No  . Sexual Activity:  Not on file   Other Topics Concern  . Not on file   Social History Narrative  . No narrative on file     Review of Systems: Constitutional: No fevers, chills, weight loss. Eyes: No blurry vision, or diplopia. HENT: No headaches, hearing loss, tinnitus, epistaxis, sore throat, or dysphasia. Pulmonary: No cough shortness of breath, or hemoptysis. Cardiovascular: No chest pains, palpitations, PND, orthopnea, or edema. Gastrointestinal: No nausea, vomiting, melena, constipation, or bloody stools. Genitourinary: scrotal swelling and drainage endorse, has history of ESRD. Neurologic: No focal extremity numbness, weakness, or reported seizures. Integumentary: No skin rashes, itching, or lesions otherwise. Musculoskeletal: No joint redness, swelling, or tenderness. Endocrine: No polyuria, polydipsia, or history of thyroid disorders. Hematologic: No easy bruisability, bleeding. Allergy/immunology: No history seasonal allergies were history of immunodeficiency. Psychiatric: No history of depression, or bipolar disorder  Vital Signs: Blood pressure 172/62, pulse 72, temperature 97.9 F (36.6 C), temperature source Oral, resp. rate 20, height 5' (1.524 m), weight 79.9 kg (176 lb 2.4 oz), SpO2 100 %.  Weight trends: Filed Weights   01/25/15 0653 01/25/15 1210 01/26/15 1015  Weight: 72.576 kg (160 lb) 78.291 kg (172 lb 9.6 oz) 79.9 kg (176 lb 2.4 oz)    Physical Exam: General: NAD  Head: Normocephalic, atraumatic.  Eyes: Anicteric, EOMI  Nose: Mucous membranes moist, not inflammed, nonerythematous.  Throat: Oropharynx nonerythematous, no exudate appreciated.   Neck: No deformities, masses, or tenderness noted.Supple, No carotid Bruits, no JVD.  Lungs:  Normal respiratory effort. Clear to auscultation BL without crackles or wheezes.  Heart: RRR. S1 and S2 normal without gallop, murmur, or rubs.  Abdomen:  BS normoactive. Soft, Nondistended, non-tender.  No masses or organomegaly.   Extremities: Trace LE edema.  Neurologic: A&O X3, Motor strength is 5/5 in all 4 extremities  Skin: No visible rashes, scars.  Access:           Left UE AVF, good bruit and thrill GU:   Right sided scrotal swelling noted, drainage on lateral aspect of right hemiscrotum. Lab results: Basic Metabolic Panel:  Recent Labs Lab 01/25/15 0846 01/26/15 0533  NA 130* 129*  K 4.4 5.2*  CL 87* 89*  CO2 26 25  GLUCOSE 98 90  BUN 54* 69*  CREATININE 11.72* 13.71*  CALCIUM 9.3 8.8*    Liver Function Tests:  Recent Labs Lab 01/25/15 0846  AST 21  ALT 20  ALKPHOS 174*  BILITOT 1.8*  PROT 8.1  ALBUMIN 4.0   No results for input(s): LIPASE, AMYLASE in the last 168 hours. No results for input(s): AMMONIA in the last 168 hours.  CBC:  Recent Labs Lab 01/25/15 0846 01/26/15 0533  WBC 11.5* 10.5  NEUTROABS 7.9*  --   HGB 12.6* 11.7*  HCT 37.3* 34.9*  MCV 99.9 100.1*  PLT 107* 103*    Cardiac Enzymes: No results for input(s): CKTOTAL, CKMB, CKMBINDEX, TROPONINI in the last 168 hours.  BNP: Invalid input(s): POCBNP  CBG: No results for input(s): GLUCAP in the last 168 hours.  Microbiology: No results found for this or any previous visit.  Coagulation Studies: No results for input(s): LABPROT, INR in the last 72 hours.  Urinalysis: No results for input(s): COLORURINE, LABSPEC, PHURINE, GLUCOSEU, HGBUR, BILIRUBINUR, KETONESUR, PROTEINUR, UROBILINOGEN, NITRITE, LEUKOCYTESUR in the last 72 hours.  Invalid input(s): APPERANCEUR    Imaging: US Scrotum  01/25/2015   CLINICAL DATA:  Palpable nodule. Scrotal abscess. The patient states he has been treated with antibiotics.  EXAM: SCROTAL ULTRASOUND  DOPPLER ULTRASOUND OF THE TESTICLES  TECHNIQUE: Complete ultrasound examination of the testicles, epididymis, and other scrotal structures was performed. Color and spectral Doppler ultrasound were also utilized to evaluate blood flow to the testicles.  COMPARISON:  None.   FINDINGS: Right testicle  Measurements: 3.7 x 1.8 x 2.8 cm, within normal limits. No mass or microlithiasis visualized.  Left testicle  Measurements: 4.0 x 2.1 x 2.9 cm, within normal limits. No mass or microlithiasis visualized.  Right epididymis:  Normal in size and appearance.  Left epididymis:  Normal in size and appearance.  Hydrocele:  A small right hydrocele is noted.  Varicocele:  None visualized.  Pulsed Doppler interrogation of both testes demonstrates normal low resistance arterial and venous waveforms bilaterally.  Lateral to the right test skull is a heterogeneous to hypoechoic collection measuring at 2.1 x 1.1 x 1.4 cm. There is marked thickening of the scrotal wall. There is no significant increased flow about the collection.  IMPRESSION: 1. Normal appearance of the testicles bilaterally. Color Doppler evaluation and waveforms are normal. 2. Small right hydrocele.  This may be reactive. 3. 2.1 cm heterogeneous hypoechoic collection compatible with a resolving abscess. There is no significant hyperemia surrounding the area. 4. Scrotal wall thickening bilaterally is likely reactive.   Electronically Signed   By: San Morelle M.D.   On: 01/25/2015 10:19   [place] Art/ven Flow Abd Pelv Doppler  01/25/2015   CLINICAL DATA:  Palpable nodule. Scrotal abscess. The patient states he has been treated with antibiotics.  EXAM: SCROTAL ULTRASOUND  DOPPLER ULTRASOUND OF THE TESTICLES  TECHNIQUE: Complete ultrasound examination of the testicles, epididymis, and other scrotal structures was performed. Color and spectral Doppler ultrasound were also utilized to evaluate blood flow to the testicles.  COMPARISON:  None.  FINDINGS: Right testicle  Measurements: 3.7 x 1.8 x 2.8 cm, within normal limits. No mass or microlithiasis visualized.  Left testicle  Measurements: 4.0 x 2.1 x 2.9 cm, within normal limits. No mass or microlithiasis visualized.  Right epididymis:  Normal in size and appearance.  Left epididymis:   Normal in size and appearance.  Hydrocele:  A small right hydrocele is noted.  Varicocele:  None visualized.  Pulsed Doppler interrogation of both testes demonstrates normal low resistance arterial and venous waveforms bilaterally.  Lateral to the right test skull is  a heterogeneous to hypoechoic collection measuring at 2.1 x 1.1 x 1.4 cm. There is marked thickening of the scrotal wall. There is no significant increased flow about the collection.  IMPRESSION: 1. Normal appearance of the testicles bilaterally. Color Doppler evaluation and waveforms are normal. 2. Small right hydrocele.  This may be reactive. 3. 2.1 cm heterogeneous hypoechoic collection compatible with a resolving abscess. There is no significant hyperemia surrounding the area. 4. Scrotal wall thickening bilaterally is likely reactive.   Electronically Signed   By: San Morelle M.D.   On: 01/25/2015 10:19      Assessment & Plan: Pt is a 43 y.o. yo male with a PMHX of end-stage renal disease on hemodialysis Tuesday, Thursday, Saturday, hypertension, anemia chronic kidney disease, secondary hyperparathyroidism, left upper extremity AV fistula, was admitted to [facility] on 01/25/2015 with scrotal abscess.  1.  End-stage renal disease on renal doses Tuesday, Thursday, Saturday.  The patient missed his dialysis treatment yesterday as he came to the emergency department for treatment of his underlying scrotal abscess.  We will proceed with dialysis treatment today as well as tomorrow.  Orders have been prepared.  We will use his left upper extremity AV fistula.  2.  Scrotal abscess/cellulitis.  The patient has significant scrotal cellulitis as well as underlying possible abscess.  He is on anabolic therapy per the hospitalist.  We also recommend urology consultation and we'll obtain this.  3.  Anemia chronic kidney disease.  Hemoglobin currently 11.7.  Hold off on Epogen for now.  4.  Secondary hyperparathyroidism.  We will start the  patient back on PhosLo 3 tablets by mouth 3 times a day with meals.  He was also on Fosrenol however we've we'll evaluate phosphorus before adding this back.  5.  Thanks for consult.

## 2015-01-26 NOTE — Progress Notes (Signed)
Nutrition Follow-up    INTERVENTION:   (Meals and snacks: Cater to pt prefences)  NUTRITION DIAGNOSIS:   (none at this time) related to   as evidenced by  .    GOAL:  Patient will meet greater than or equal to 90% of their needs    MONITOR:   (Energy intake, Electrolyte and renal profile, Anthropometrics)  REASON FOR ASSESSMENT:  Other (Comment) (renal diet order)    ASSESSMENT:  Pt admitted with scrotal cellulitis, abscess.  PMHx:  Past Medical History  Diagnosis Date  . Renal disorder   . Dialysis patient     Current Nutrition: unable to speak with pt this am in dialysis and out of room. Spoke with RN, Pryor Montes and reports ate pancake and fruit cup this am for breakfast and tolerated well.  RN to complete nutrition screening once back from dialysis  Food/Nutrition-Related History: unable to determine at this time   Medications: reviewed, no pertinent medications  Electrolyte/Renal Profile and Glucose Profile:   Recent Labs Lab 01/25/15 0846 01/26/15 0533  NA 130* 129*  K 4.4 5.2*  CL 87* 89*  CO2 26 25  BUN 54* 69*  CREATININE 11.72* 13.71*  CALCIUM 9.3 8.8*  GLUCOSE 98 90   Protein Profile:  Recent Labs Lab 01/25/15 0846  ALBUMIN 4.0    Last BM: none since admission   Nutrition-Focused Physical Exam Findings:  Unable to complete Nutrition-Focused physical exam at this time.    Weight Change: unable to determine at this time   Height:  Ht Readings from Last 1 Encounters:  01/25/15 5' (1.524 m)    Weight:  Wt Readings from Last 1 Encounters:  01/25/15 172 lb 9.6 oz (78.291 kg)        Wt Readings from Last 10 Encounters:  01/25/15 172 lb 9.6 oz (78.291 kg)    BMI:  Body mass index is 33.71 kg/(m^2).      Diet Order:  Diet renal with fluid restriction Fluid restriction:: 1200 mL Fluid; Room service appropriate?: Yes; Fluid consistency:: Thin  EDUCATION NEEDS:  No education needs identified at this  time   Intake/Output Summary (Last 24 hours) at 01/26/15 1038 Last data filed at 01/26/15 0855  Gross per 24 hour  Intake 1230.99 ml  Output      0 ml  Net 1230.99 ml      LOW Care Level Shewanda Sharpe B. Zenia Resides, [place], [place] (pager)

## 2015-01-27 MED ORDER — LIDOCAINE HCL 1 % IJ SOLN
10.0000 mL | Freq: Once | INTRAMUSCULAR | Status: AC
Start: 2015-01-27 — End: 2015-01-27
  Administered 2015-01-27: 10 mL
  Filled 2015-01-27: qty 10

## 2015-01-27 MED ORDER — CALCIUM ACETATE (PHOS BINDER) 667 MG PO CAPS
2001.0000 mg | ORAL_CAPSULE | Freq: Three times a day (TID) | ORAL | Status: DC
  Administered 2015-01-27 – 2015-01-28 (×2): 2001 mg via ORAL
  Filled 2015-01-27 (×5): qty 3

## 2015-01-27 MED ORDER — CLINDAMYCIN HCL 150 MG PO CAPS
600.0000 mg | ORAL_CAPSULE | Freq: Three times a day (TID) | ORAL | Status: DC
  Administered 2015-01-27 – 2015-01-28 (×3): 600 mg via ORAL
  Filled 2015-01-27 (×3): qty 4

## 2015-01-27 NOTE — Progress Notes (Signed)
[place] [facility] [place] NAME: Mario [name]    MR#:  [id number]  DATE OF BIRTH:  November 20, 1970  SUBJECTIVE:  Via interpreter. Scrotal pain. No fever. Tolerating diet  REVIEW OF SYSTEMS:   Review of Systems  Constitutional: Negative for fever, chills and weight loss.  HENT: Negative for ear discharge, ear pain and nosebleeds.   Eyes: Negative for blurred vision, pain and discharge.  Respiratory: Negative for sputum production, shortness of breath, wheezing and stridor.   Cardiovascular: Negative for chest pain, palpitations, orthopnea and PND.  Gastrointestinal: Negative for nausea, vomiting, abdominal pain and diarrhea.  Genitourinary: Negative for urgency and frequency.  Musculoskeletal: Negative for back pain and joint pain.  Neurological: Negative for sensory change, speech change, focal weakness and weakness.  Psychiatric/Behavioral: Negative for depression. The patient is not nervous/anxious.   All other systems reviewed and are negative.  Tolerating Diet:yes   DRUG ALLERGIES:  No Known Allergies  VITALS:  Blood pressure 136/70, pulse 72, temperature 97.7 F (36.5 C), temperature source Oral, resp. rate 20, height 5' (1.524 m), weight 78.4 kg (172 lb 13.5 oz), SpO2 100 %.  PHYSICAL EXAMINATION:   Physical Exam  GENERAL:  44 y.o.-year-old patient lying in the bed with no acute distress.  EYES: Pupils equal, round, reactive to light and accommodation. No scleral icterus. Extraocular muscles intact.  HEENT: Head atraumatic, normocephalic. Oropharynx and nasopharynx clear.  NECK:  Supple, no jugular venous distention. No thyroid enlargement, no tenderness.  LUNGS: Normal breath sounds bilaterally, no wheezing, rales, rhonchi. No use of accessory muscles of respiration.  CARDIOVASCULAR: S1, S2 normal. No murmurs, rubs, or gallops.  ABDOMEN: Soft, nontender, nondistended. Bowel sounds present. No organomegaly or mass.   EXTREMITIES: No cyanosis, clubbing or edema b/l.    NEUROLOGIC: Cranial nerves II through XII are intact. No focal Motor or sensory deficits b/l.   PSYCHIATRIC: The patient is alert and oriented x 3.  SKIN: No obvious rash, lesion, or ulcer. scrtotal edema. Ulceer/swelling right side of scrotum   LABORATORY PANEL:   CBC  Recent Labs Lab 01/26/15 0533  WBC 10.5  HGB 11.7*  HCT 34.9*  PLT 103*    Chemistries   Recent Labs Lab 01/25/15 0846 01/26/15 0533  NA 130* 129*  K 4.4 5.2*  CL 87* 89*  CO2 26 25  GLUCOSE 98 90  BUN 54* 69*  CREATININE 11.72* 13.71*  CALCIUM 9.3 8.8*  AST 21  --   ALT 20  --   ALKPHOS 174*  --   BILITOT 1.8*  --     ASSESSMENT AND PLAN:   44 year old Spanish-speaking male presents with scrotal swelling  1. Scrotal swelling due to scrotal cellulitiswith abscess -IV vanc and unasyn changed to po clinda -seen by Urology with s/p I and D and packing -ok to go home from urology standpoint -scrtoal elevation -prn pain meds -BC neg -WC pending  2. End-stage renal disease: appreciate Nephrology consult for hemodialysis  3. Anemia of Chronic Disease: stable  4. Hyponatremia: chronic, should get corrected with dialysis.  Management plans discussed with the patient, family and they are in agreement.  CODE STATUS: full  DVT Prophylaxis: heparin  TOTAL TIME TAKING CARE OF THIS PATIENT: 25 minutes.  >50% time spent on counselling and coordination of care  POSSIBLE D/C IN 1 DAYS, DEPENDING ON CLINICAL CONDITION.   Matricia Begnaud M.D on 01/27/2015 at 5:04 PM  Between 7am to 6pm - Pager - [phone]  After 6pm go to [email or web address] - password [email or web address] [facility]  [place] [facility]  Office  [phone]  CC: Primary care physician; No PCP Per Patient

## 2015-01-27 NOTE — Consult Note (Signed)
Urology Consult  Referring physician: Max Sane Reason for referral: scrotal abscess  Chief Complaint: right scrotal pain  History of Present Illness: [name] is a 44yo with a hx of ESRD who was admitted 2 days ago with increased scrotal swelling and drainage. [place] was obtained which showed a 2cm abscess. He is currently on broad spectrum antibiotics. He pain is sharp, constant, severe, nonradiating and located in the right hemiscrotum. He noted drainage from his right hemiscrotum starting 3 days ago. No fevers/chill/sweats. No nausea/vomiting. He is not diabetic. He had a scrotal abscess drainage over 1 year ago.   Past Medical History  Diagnosis Date  . Renal disorder   . Dialysis patient    Past Surgical History  Procedure Laterality Date  . Av fistula placement      Medications: I have reviewed the patient's current medications. Allergies: No Known Allergies  Family History  Problem Relation Age of Onset  . Kidney failure Father    Social History:  reports that he has been smoking Cigarettes.  He has been smoking about 0.25 packs per day. He does not have any smokeless tobacco history on file. He reports that he does not drink alcohol or use illicit drugs.  Review of Systems  Genitourinary: Negative for dysuria, urgency, frequency, hematuria and flank pain.       +scrotal pain and drainage  All other systems reviewed and are negative.   Physical Exam:  Vital signs in last 24 hours: Temp:  [97.7 F (36.5 C)-99 F (37.2 C)] 97.8 F (36.6 C) (06/25 0818) Pulse Rate:  [69-86] 71 (06/25 0818) Resp:  [16-20] 20 (06/25 0818) BP: (151-190)/(57-81) 166/81 mmHg (06/25 0818) SpO2:  [95 %-100 %] 100 % (06/25 0818) Weight:  [78.3 kg (172 lb 9.9 oz)-79.9 kg (176 lb 2.4 oz)] 78.3 kg (172 lb 9.9 oz) (06/24 1411) Physical Exam  Constitutional: He appears well-developed and well-nourished.  HENT:  Head: Normocephalic and atraumatic.  Eyes: EOM are normal. Pupils are equal, round,  and reactive to light.  Neck: Normal range of motion. Neck supple. No thyromegaly present.  Cardiovascular: Normal rate and regular rhythm.   Respiratory: Effort normal and breath sounds normal. No respiratory distress.  GI: Soft. He exhibits no distension and no mass. There is no tenderness. There is no rebound and no guarding.  Genitourinary: Testes normal and penis normal. Right testis shows no mass, no swelling and no tenderness. Left testis shows no mass, no swelling and no tenderness. No phimosis, hypospadias or penile tenderness.  Right 3cm lateral scrotal wall abscess. Moderate erythema and severe scrotal edema    Laboratory Data:  Results for orders placed or performed during the hospital encounter of 01/25/15 (from the past 72 hour(s))  CBC with Differential     Status: Abnormal   Collection Time: 01/25/15  8:46 AM  Result Value Ref Range   WBC 11.5 (H) 3.8 - 10.6 K/uL   RBC 3.73 (L) 4.40 - 5.90 MIL/uL   Hemoglobin 12.6 (L) 13.0 - 18.0 g/dL   HCT 37.3 (L) 40.0 - 52.0 %   MCV 99.9 80.0 - 100.0 fL   MCH 33.7 26.0 - 34.0 pg   MCHC 33.7 32.0 - 36.0 g/dL   RDW 14.8 (H) 11.5 - 14.5 %   Platelets 107 (L) 150 - 440 K/uL   Neutrophils Relative % 68 %   Neutro Abs 7.9 (H) 1.4 - 6.5 K/uL   Lymphocytes Relative 8 %   Lymphs Abs 0.9 (L) 1.0 -  3.6 K/uL   Monocytes Relative 9 %   Monocytes Absolute 1.0 0.2 - 1.0 K/uL   Eosinophils Relative 15 %   Eosinophils Absolute 1.7 (H) 0 - 0.7 K/uL   Basophils Relative 0 %   Basophils Absolute 0.0 0 - 0.1 K/uL  Comprehensive metabolic panel     Status: Abnormal   Collection Time: 01/25/15  8:46 AM  Result Value Ref Range   Sodium 130 (L) 135 - 145 mmol/L   Potassium 4.4 3.5 - 5.1 mmol/L   Chloride 87 (L) 101 - 111 mmol/L   CO2 26 22 - 32 mmol/L   Glucose, Bld 98 65 - 99 mg/dL   BUN 54 (H) 6 - 20 mg/dL   Creatinine, Ser 11.72 (H) 0.61 - 1.24 mg/dL   Calcium 9.3 8.9 - 10.3 mg/dL   Total Protein 8.1 6.5 - 8.1 g/dL   Albumin 4.0 3.5 - 5.0  g/dL   AST 21 15 - 41 U/L   ALT 20 17 - 63 U/L   Alkaline Phosphatase 174 (H) 38 - 126 U/L   Total Bilirubin 1.8 (H) 0.3 - 1.2 mg/dL   GFR calc non Af Amer 5 (L) >60 mL/min   GFR calc Af Amer 5 (L) >60 mL/min    Comment: (NOTE) The eGFR has been calculated using the CKD EPI equation. This calculation has not been validated in all clinical situations. eGFR's persistently <60 mL/min signify possible Chronic Kidney Disease.    Anion gap 17 (H) 5 - 15  Hepatitis B surface antigen     Status: None   Collection Time: 01/25/15  8:46 AM  Result Value Ref Range   Hepatitis B Surface Ag Negative Negative    Comment: (NOTE) Performed At: [facility] [place], [place] [id number] Lindon Romp MD [id number]   Culture, blood (routine x 2)     Status: None (Preliminary result)   Collection Time: 01/25/15  8:47 AM  Result Value Ref Range   Specimen Description BLOOD    Special Requests NONE    Culture NO GROWTH 2 DAYS    Report Status PENDING   Culture, blood (routine x 2)     Status: None (Preliminary result)   Collection Time: 01/25/15  8:47 AM  Result Value Ref Range   Specimen Description BLOOD    Special Requests NONE    Culture NO GROWTH 2 DAYS    Report Status PENDING   CBC     Status: Abnormal   Collection Time: 01/26/15  5:33 AM  Result Value Ref Range   WBC 10.5 3.8 - 10.6 K/uL   RBC 3.49 (L) 4.40 - 5.90 MIL/uL   Hemoglobin 11.7 (L) 13.0 - 18.0 g/dL   HCT 34.9 (L) 40.0 - 52.0 %   MCV 100.1 (H) 80.0 - 100.0 fL   MCH 33.6 26.0 - 34.0 pg   MCHC 33.6 32.0 - 36.0 g/dL   RDW 14.7 (H) 11.5 - 14.5 %   Platelets 103 (L) 150 - 440 K/uL  Basic metabolic panel     Status: Abnormal   Collection Time: 01/26/15  5:33 AM  Result Value Ref Range   Sodium 129 (L) 135 - 145 mmol/L   Potassium 5.2 (H) 3.5 - 5.1 mmol/L   Chloride 89 (L) 101 - 111 mmol/L   CO2 25 22 - 32 mmol/L   Glucose, Bld 90 65 - 99 mg/dL   BUN 69 (H) 6 - 20 mg/dL   Creatinine, Ser 13.71  (  H) 0.61 - 1.24 mg/dL   Calcium 8.8 (L) 8.9 - 10.3 mg/dL   GFR calc non Af Amer 4 (L) >60 mL/min   GFR calc Af Amer 4 (L) >60 mL/min    Comment: (NOTE) The eGFR has been calculated using the CKD EPI equation. This calculation has not been validated in all clinical situations. eGFR's persistently <60 mL/min signify possible Chronic Kidney Disease.    Anion gap 15 5 - 15   Recent Results (from the past 240 hour(s))  Culture, blood (routine x 2)     Status: None (Preliminary result)   Collection Time: 01/25/15  8:47 AM  Result Value Ref Range Status   Specimen Description BLOOD  Final   Special Requests NONE  Final   Culture NO GROWTH 2 DAYS  Final   Report Status PENDING  Incomplete  Culture, blood (routine x 2)     Status: None (Preliminary result)   Collection Time: 01/25/15  8:47 AM  Result Value Ref Range Status   Specimen Description BLOOD  Final   Special Requests NONE  Final   Culture NO GROWTH 2 DAYS  Final   Report Status PENDING  Incomplete   Creatinine:  Recent Labs  01/25/15 0846 01/26/15 0533  CREATININE 11.72* 13.71*    Impression/Assessment:  Right scrotal abscess  Plan:  1. Risks/benefits/alternatives to incision and drainage of right scrotal abscess was explained to the patient and he understands and wishes to proceed with the procedure 2. Have patient change scrotal packing daily until incision closes 3. Clindamycin versus bactrim for 1 week 4. Scrotal elevation and support to decrease edema 5. Followup 2 weeks with Urology  [facility], Alejandro Gamel L 01/27/2015, 9:29 AM

## 2015-01-27 NOTE — Progress Notes (Signed)
ANTIBIOTIC CONSULT NOTE - FOLLOW UP Pharmacy Consult for Vancomycin/Unasyn Indication: scrotal cellulitis/abscess  No Known Allergies  Patient Measurements: Height: 5' (152.4 cm) Weight: 175 lb 14.8 oz (79.8 kg) IBW/kg (Calculated) : 50 Adjusted Body Weight: 61kg  Labs:  Recent Labs  01/25/15 0846 01/26/15 0533  WBC 11.5* 10.5  HGB 12.6* 11.7*  PLT 107* 103*  CREATININE 11.72* 13.71*   Estimated Creatinine Clearance: 6 mL/min (by C-G formula based on Cr of 13.71).   Microbiology: Recent Results (from the past 720 hour(s))  Culture, blood (routine x 2)     Status: None (Preliminary result)   Collection Time: 01/25/15  8:47 AM  Result Value Ref Range Status   Specimen Description BLOOD  Final   Special Requests NONE  Final   Culture NO GROWTH 2 DAYS  Final   Report Status PENDING  Incomplete  Culture, blood (routine x 2)     Status: None (Preliminary result)   Collection Time: 01/25/15  8:47 AM  Result Value Ref Range Status   Specimen Description BLOOD  Final   Special Requests NONE  Final   Culture NO GROWTH 2 DAYS  Final   Report Status PENDING  Incomplete    Medical History: Past Medical History  Diagnosis Date  . Renal disorder   . Dialysis patient     Medications:  Anti-infectives    Start     Dose/Rate Route Frequency Ordered Stop   01/27/15 1200  vancomycin (VANCOCIN) IVPB 750 mg/150 ml premix     750 mg 150 mL/hr over 60 Minutes Intravenous Every T-Th-Sa (Hemodialysis) 01/25/15 1523     01/26/15 1800  vancomycin (VANCOCIN) IVPB 750 mg/150 ml premix     750 mg 150 mL/hr over 60 Minutes Intravenous  Once 01/26/15 1438 01/26/15 1912   01/25/15 1800  Ampicillin-Sulbactam (UNASYN) 3 g in sodium chloride 0.9 % 100 mL IVPB     3 g 100 mL/hr over 60 Minutes Intravenous Every 24 hours 01/25/15 1526     01/25/15 1600  vancomycin (VANCOCIN) 1,500 mg in sodium chloride 0.9 % 500 mL IVPB     1,500 mg 250 mL/hr over 120 Minutes Intravenous  Once 01/25/15 1523  01/25/15 1842   01/25/15 1300  vancomycin (VANCOCIN) IVPB 1000 mg/200 mL premix  Status:  Discontinued     1,000 mg 200 mL/hr over 60 Minutes Intravenous  Once 01/25/15 1228 01/25/15 1515   01/25/15 1130  Ampicillin-Sulbactam (UNASYN) 3 g in sodium chloride 0.9 % 100 mL IVPB  Status:  Discontinued     3 g 100 mL/hr over 60 Minutes Intravenous Every 6 hours 01/25/15 1129 01/25/15 1526   01/25/15 0900  clindamycin (CLEOCIN) IVPB 600 mg     600 mg 100 mL/hr over 30 Minutes Intravenous  Once 01/25/15 0859 01/25/15 0941     Assessment: Pharmacy consulted to dose vancomycin for scrotal abscess/cellulitis in this 44 year old male with ESRD on HD.  Patient on T,Th,Sat HD schedule as an outpatient.   Plan: Vancomycin 1500mg  IV x 1 (given 6/23)followed by 750mg  IV QHD. Current orders for TThSat schedule, patient missed dialysis yesterday (6/23) and is to get dialysis on 6/24 and 6/25.  Will order vancomycin 1g iv to be given after dialysis.  Unless otherwise clinically indicated,will obtain trough prior to dialysis on 6/28. Target trough 15-29mcg/ml  Will continue Unasyn 3gm IV Q24H with dose to be given after HD on dialysis days.  Pharmacy to monitor and adjust per consult.  Chinita Greenland PharmD Clinical Pharmacist 01/27/2015

## 2015-01-27 NOTE — Progress Notes (Signed)
HD END

## 2015-01-27 NOTE — Progress Notes (Signed)
HD START 

## 2015-01-27 NOTE — Progress Notes (Signed)
PRE HD   

## 2015-01-27 NOTE — Progress Notes (Signed)
POST HD 

## 2015-01-27 NOTE — Progress Notes (Signed)
[facility]  ROUNDING NOTE   Subjective:  Pt seen during HD.  Tolerating well.  Overall feeling better s/p drainage of abscess.   Objective:  Vital signs in last 24 hours:  Temp:  [97.7 F (36.5 C)-99 F (37.2 C)] 97.7 F (36.5 C) (06/25 1030) Pulse Rate:  [71-86] 81 (06/25 1230) Resp:  [15-23] 20 (06/25 1230) BP: (142-191)/(62-81) 188/64 mmHg (06/25 1230) SpO2:  [95 %-100 %] 100 % (06/25 1030) Weight:  [78.3 kg (172 lb 9.9 oz)-79.8 kg (175 lb 14.8 oz)] 79.8 kg (175 lb 14.8 oz) (06/25 1030)  Weight change: 1.609 kg (3 lb 8.8 oz) Filed Weights   01/26/15 1015 01/26/15 1411 01/27/15 1030  Weight: 79.9 kg (176 lb 2.4 oz) 78.3 kg (172 lb 9.9 oz) 79.8 kg (175 lb 14.8 oz)    Intake/Output: I/O last 3 completed shifts: In: 1116 [P.O.:118; IV Piggyback:998] Out: 1000 [Other:1000]   Intake/Output this shift:     Physical Exam: General: NAD  Head: Normocephalic, atraumatic. Moist oral mucosal membranes  Eyes: Anicteric  Neck: Supple, trachea midline  Lungs:  Clear to auscultation normal effort  Heart: Regular rate and rhythm  Abdomen:  Soft, nontender, BS present  Extremities:  1+ peripheral edema.  Neurologic: Nonfocal, moving all four extremities  Skin: No lesions  Access: LUE AVF    Basic Metabolic Panel:  Recent Labs Lab 01/25/15 0846 01/26/15 0533  NA 130* 129*  K 4.4 5.2*  CL 87* 89*  CO2 26 25  GLUCOSE 98 90  BUN 54* 69*  CREATININE 11.72* 13.71*  CALCIUM 9.3 8.8*    Liver Function Tests:  Recent Labs Lab 01/25/15 0846  AST 21  ALT 20  ALKPHOS 174*  BILITOT 1.8*  PROT 8.1  ALBUMIN 4.0   No results for input(s): LIPASE, AMYLASE in the last 168 hours. No results for input(s): AMMONIA in the last 168 hours.  CBC:  Recent Labs Lab 01/25/15 0846 01/26/15 0533  WBC 11.5* 10.5  NEUTROABS 7.9*  --   HGB 12.6* 11.7*  HCT 37.3* 34.9*  MCV 99.9 100.1*  PLT 107* 103*    Cardiac Enzymes: No results for input(s): CKTOTAL,  CKMB, CKMBINDEX, TROPONINI in the last 168 hours.  BNP: Invalid input(s): POCBNP  CBG: No results for input(s): GLUCAP in the last 168 hours.  Microbiology: Results for orders placed or performed during the hospital encounter of 01/25/15  Culture, blood (routine x 2)     Status: None (Preliminary result)   Collection Time: 01/25/15  8:47 AM  Result Value Ref Range Status   Specimen Description BLOOD  Final   Special Requests NONE  Final   Culture NO GROWTH 2 DAYS  Final   Report Status PENDING  Incomplete  Culture, blood (routine x 2)     Status: None (Preliminary result)   Collection Time: 01/25/15  8:47 AM  Result Value Ref Range Status   Specimen Description BLOOD  Final   Special Requests NONE  Final   Culture NO GROWTH 2 DAYS  Final   Report Status PENDING  Incomplete    Coagulation Studies: No results for input(s): LABPROT, INR in the last 72 hours.  Urinalysis: No results for input(s): COLORURINE, LABSPEC, PHURINE, GLUCOSEU, HGBUR, BILIRUBINUR, KETONESUR, PROTEINUR, UROBILINOGEN, NITRITE, LEUKOCYTESUR in the last 72 hours.  Invalid input(s): APPERANCEUR    Imaging: No results found.   Medications:     . ampicillin-sulbactam (UNASYN) IV  3 g Intravenous Q24H  . heparin  5,000 Units Subcutaneous  3 times per day  . vancomycin  750 mg Intravenous Q T,Th,Sa-HD   acetaminophen **OR** acetaminophen, HYDROcodone-acetaminophen, morphine injection, ondansetron **OR** ondansetron (ZOFRAN) IV  Assessment/ Plan:  44 y.o. male with a PMHX of end-stage renal disease on hemodialysis Tuesday, Thursday, Saturday, hypertension, anemia chronic kidney disease, secondary hyperparathyroidism, left upper extremity AV fistula, was admitted to [facility] on 01/25/2015 with scrotal abscess.  1. End-stage renal disease on renal doses Tuesday, Thursday, Saturday.  Pt seen during HD, tolerating well, complete HD today, will plan for HD again on Tuesday.  2. Scrotal abscess/cellulitis. s/p  I&D 01/26/15. Contineu unaysn and vancomycin for now, would narrow spectrum in the next several days.  3. Anemia chronic kidney disease. hold epogen for now.  4. Secondary hyperparathyroidism.continue phoslo 3 tabs po tid/wm which is his home dose, order placed.   LOS: 2 Mario [name] 6/25/20161:08 PM

## 2015-01-28 ENCOUNTER — Other Ambulatory Visit: Payer: Self-pay

## 2015-01-28 ENCOUNTER — Encounter: Payer: Self-pay | Admitting: Emergency Medicine

## 2015-01-28 ENCOUNTER — Emergency Department
Admission: EM | Admit: 2015-01-28 | Discharge: 2015-01-28 | Disposition: A | Discharge status: Home / Self Care | Payer: Self-pay | Attending: Student | Admitting: Student

## 2015-01-28 DIAGNOSIS — L7622 Postprocedural hemorrhage and hematoma of skin and subcutaneous tissue following other procedure: Secondary | ICD-10-CM | POA: Insufficient documentation

## 2015-01-28 DIAGNOSIS — Z992 Dependence on renal dialysis: Secondary | ICD-10-CM | POA: Insufficient documentation

## 2015-01-28 DIAGNOSIS — N186 End stage renal disease: Secondary | ICD-10-CM | POA: Insufficient documentation

## 2015-01-28 DIAGNOSIS — T814XXA Infection following a procedure, initial encounter: Secondary | ICD-10-CM

## 2015-01-28 DIAGNOSIS — Z79899 Other long term (current) drug therapy: Secondary | ICD-10-CM | POA: Insufficient documentation

## 2015-01-28 DIAGNOSIS — N492 Inflammatory disorders of scrotum: Secondary | ICD-10-CM | POA: Insufficient documentation

## 2015-01-28 DIAGNOSIS — Y838 Other surgical procedures as the cause of abnormal reaction of the patient, or of later complication, without mention of misadventure at the time of the procedure: Secondary | ICD-10-CM | POA: Insufficient documentation

## 2015-01-28 DIAGNOSIS — IMO0001 Reserved for inherently not codable concepts without codable children: Secondary | ICD-10-CM

## 2015-01-28 DIAGNOSIS — Z720 Tobacco use: Secondary | ICD-10-CM | POA: Insufficient documentation

## 2015-01-28 DIAGNOSIS — Z792 Long term (current) use of antibiotics: Secondary | ICD-10-CM | POA: Insufficient documentation

## 2015-01-28 LAB — COMPREHENSIVE METABOLIC PANEL
ALT: 21 U/L (ref 17–63)
AST: 35 U/L (ref 15–41)
Albumin: 3.4 g/dL — ABNORMAL LOW (ref 3.5–5.0)
Alkaline Phosphatase: 159 U/L — ABNORMAL HIGH (ref 38–126)
Anion gap: 13 (ref 5–15)
BILIRUBIN TOTAL: 1.3 mg/dL — AB (ref 0.3–1.2)
BUN: 31 mg/dL — ABNORMAL HIGH (ref 6–20)
CO2: 35 mmol/L — ABNORMAL HIGH (ref 22–32)
CREATININE: 7.08 mg/dL — AB (ref 0.61–1.24)
Calcium: 9.7 mg/dL (ref 8.9–10.3)
Chloride: 90 mmol/L — ABNORMAL LOW (ref 101–111)
GFR calc Af Amer: 10 mL/min — ABNORMAL LOW (ref >60)
GFR, EST NON AFRICAN AMERICAN: 8 mL/min — AB (ref >60)
Glucose, Bld: 148 mg/dL — ABNORMAL HIGH (ref 65–99)
POTASSIUM: 4.7 mmol/L (ref 3.5–5.1)
Sodium: 138 mmol/L (ref 135–145)
Total Protein: 7.2 g/dL (ref 6.5–8.1)

## 2015-01-28 LAB — CBC
HCT: 31.4 % — ABNORMAL LOW (ref 40.0–52.0)
Hemoglobin: 10.7 g/dL — ABNORMAL LOW (ref 13.0–18.0)
MCH: 33.7 pg (ref 26.0–34.0)
MCHC: 33.9 g/dL (ref 32.0–36.0)
MCV: 99.4 fL (ref 80.0–100.0)
Platelets: 143 10*3/uL — ABNORMAL LOW (ref 150–440)
RBC: 3.16 MIL/uL — ABNORMAL LOW (ref 4.40–5.90)
RDW: 14.4 % (ref 11.5–14.5)
WBC: 5.5 10*3/uL (ref 3.8–10.6)

## 2015-01-28 LAB — PROTIME-INR
INR: 1.13
Prothrombin Time: 14.7 seconds (ref 11.4–15.0)

## 2015-01-28 LAB — TYPE AND SCREEN
ABO/RH(D): A POS
Antibody Screen: NEGATIVE

## 2015-01-28 LAB — ABO/RH: ABO/RH(D): A POS

## 2015-01-28 LAB — APTT: aPTT: 40 seconds — ABNORMAL HIGH (ref 24–36)

## 2015-01-28 MED ORDER — CLINDAMYCIN HCL 300 MG PO CAPS: 600.0000 mg | ORAL_CAPSULE | Freq: Three times a day (TID) | ORAL | Status: DC

## 2015-01-28 MED ORDER — MORPHINE SULFATE 4 MG/ML IJ SOLN
INTRAMUSCULAR | Status: AC
  Administered 2015-01-28: 4 mg
  Filled 2015-01-28: qty 1

## 2015-01-28 MED ORDER — METOPROLOL TARTRATE 25 MG PO TABS
25.0000 mg | ORAL_TABLET | Freq: Two times a day (BID) | ORAL | Status: DC
  Administered 2015-01-28: 25 mg via ORAL
  Filled 2015-01-28: qty 1

## 2015-01-28 MED ORDER — HYDROCODONE-ACETAMINOPHEN 5-325 MG PO TABS: 1.0000 | ORAL_TABLET | ORAL | Status: DC | PRN

## 2015-01-28 MED ORDER — ONDANSETRON HCL 4 MG/2ML IJ SOLN
INTRAMUSCULAR | Status: AC
  Filled 2015-01-28: qty 2

## 2015-01-28 MED ORDER — METOPROLOL TARTRATE 25 MG PO TABS: 25.0000 mg | ORAL_TABLET | Freq: Two times a day (BID) | ORAL | Status: DC

## 2015-01-28 MED ORDER — ONDANSETRON HCL 4 MG/2ML IJ SOLN
4.0000 mg | Freq: Once | INTRAMUSCULAR | Status: AC
  Administered 2015-01-28: 4 mg via INTRAVENOUS

## 2015-01-28 MED ORDER — MORPHINE SULFATE 4 MG/ML IJ SOLN
4.0000 mg | Freq: Once | INTRAMUSCULAR | Status: AC
  Administered 2015-01-28: 4 mg via INTRAVENOUS

## 2015-01-28 NOTE — Care Management Note (Signed)
Case Management Note  Patient Details  Name: Mario [name] MRN: [id number] Date of Birth: 05/15/1971  Subjective/Objective:         Discussed discharge planning with Denny Peon, RN. No home health services ordered and home prescriptions were electronically sent to a pharmacy by the discharging physician.    Action/Plan:   Expected Discharge Date:                  Expected Discharge Plan:     In-House Referral:     Discharge planning Services     Post Acute Care Choice:    Choice offered to:     DME Arranged:    DME Agency:     HH Arranged:    [place] Agency:     Status of Service:     Medicare Important Message Given:    Date Medicare IM Given:    Medicare IM give by:    Date Additional Medicare IM Given:    Additional Medicare Important Message give by:     If discussed at [place] of Stay Meetings, dates discussed:    Additional Comments:  Lindi Abram A, RN 01/28/2015, 10:53 AM

## 2015-01-28 NOTE — Discharge Summary (Signed)
[place] [facility] [place] NAME: Mario [name]    MR#:  [id number]  DATE OF BIRTH:  January 17, 1971  DATE OF ADMISSION:  01/25/2015 ADMITTING PHYSICIAN: Dustin Flock, MD  DATE OF DISCHARGE: 01/28/15  PRIMARY CARE PHYSICIAN: No PCP Per Patient    ADMISSION DIAGNOSIS:  Abscess of scrotum [N49.2] Cellulitis of scrotum [N49.2] Swelling [R60.9]  DISCHARGE DIAGNOSIS:  Scrotal abscess s/p I and D HTN ESRD on HD  SECONDARY DIAGNOSIS:   Past Medical History  Diagnosis Date  . Renal disorder   . Dialysis patient     HOSPITAL COURSE:    44 year old Spanish-speaking male presents with scrotal swelling  1. Scrotal swelling due to scrotal cellulitis with abscess -IV vanc and unasyn changed to po clinda -seen by Urology with s/p I and D and packing -ok to go home from urology standpoint -scrtoal elevation -prn pain meds -BC neg -WC rare staph aureus. Sensitivity pending  2. End-stage renal disease: on hemodialysis  3. Anemia of Chronic Disease: stable  4. HTN  -bp on higher side. Could be due to pain also. Will start po metoprolol Pt was on antiHTNsives in the past  -overall stable -d/c home Dressing changes and f/u appt discussed via interpreter  DISCHARGE CONDITIONS:   fair  CONSULTS OBTAINED:  Treatment Team:  Dustin Flock, MD Murlean Iba, MD Cleon Gustin, MD  DRUG ALLERGIES:  No Known Allergies  DISCHARGE MEDICATIONS:   Current Discharge Medication List    START taking these medications   Details  clindamycin (CLEOCIN) 300 MG capsule Take 2 capsules (600 mg total) by mouth 3 (three) times daily. Qty: 21 capsule, Refills: 0    HYDROcodone-acetaminophen (NORCO/VICODIN) 5-325 MG per tablet Take 1-2 tablets by mouth every 4 (four) hours as needed for moderate pain. Qty: 30 tablet, Refills: 0    metoprolol tartrate (LOPRESSOR) 25 MG tablet Take 1 tablet (25 mg total) by mouth 2 (two) times  daily. Qty: 60 tablet, Refills: 0      CONTINUE these medications which have NOT CHANGED   Details  calcium acetate (PHOSLO) 667 MG tablet Take 667 mg by mouth 3 (three) times daily with meals.    lanthanum (FOSRENOL) 1000 MG chewable tablet Chew 1,000 mg by mouth daily.    Multiple Vitamin (RENAL MULTIVITAMIN/ZINC PO) Take 1 tablet by mouth daily.       If you experience worsening of your admission symptoms, develop shortness of breath, life threatening emergency, suicidal or homicidal thoughts you must seek medical attention immediately by calling 911 or calling your MD immediately  if symptoms less severe.  You Must read complete instructions/literature along with all the possible adverse reactions/side effects for all the Medicines you take and that have been prescribed to you. Take any new Medicines after you have completely understood and accept all the possible adverse reactions/side effects.   Please note  You were cared for by a hospitalist during your hospital stay. If you have any questions about your discharge medications or the care you received while you were in the hospital after you are discharged, you can call the unit and asked to speak with the hospitalist on call if the hospitalist that took care of you is not available. Once you are discharged, your primary care physician will handle any further medical issues. Please note that NO REFILLS for any discharge medications will be authorized once you are discharged, as it is imperative that you return to your primary  care physician (or establish a relationship with a primary care physician if you do not have one) for your aftercare needs so that they can reassess your need for medications and monitor your lab values. Today   SUBJECTIVE   Feels ok. vomitted small amt last nite  VITAL SIGNS:  Blood pressure 161/84, pulse 84, temperature 98 F (36.7 C), temperature source Oral, resp. rate 16, height 5' (1.524 m), weight 78.4  kg (172 lb 13.5 oz), SpO2 99 %.  I/O:   Intake/Output Summary (Last 24 hours) at 01/28/15 0908 Last data filed at 01/28/15 0100  Gross per 24 hour  Intake      0 ml  Output   1000 ml  Net  -1000 ml    PHYSICAL EXAMINATION:  GENERAL:  44 y.o.-year-old patient lying in the bed with no acute distress.  EYES: Pupils equal, round, reactive to light and accommodation. No scleral icterus. Extraocular muscles intact.  HEENT: Head atraumatic, normocephalic. Oropharynx and nasopharynx clear.  NECK:  Supple, no jugular venous distention. No thyroid enlargement, no tenderness.  LUNGS: Normal breath sounds bilaterally, no wheezing, rales,rhonchi or crepitation. No use of accessory muscles of respiration.  CARDIOVASCULAR: S1, S2 normal. No murmurs, rubs, or gallops.  ABDOMEN: Soft, non-tender, non-distended. Bowel sounds present. No organomegaly or mass.  EXTREMITIES: No pedal edema, cyanosis, or clubbing.  NEUROLOGIC: Cranial nerves II through XII are intact. Muscle strength 5/5 in all extremities. Sensation intact. Gait not checked.  PSYCHIATRIC: The patient is alert and oriented x 3.  SKIN: scrotal swelling +  DATA REVIEW:   CBC   Recent Labs Lab 01/26/15 0533  WBC 10.5  HGB 11.7*  HCT 34.9*  PLT 103*    Chemistries   Recent Labs Lab 01/25/15 0846 01/26/15 0533  NA 130* 129*  K 4.4 5.2*  CL 87* 89*  CO2 26 25  GLUCOSE 98 90  BUN 54* 69*  CREATININE 11.72* 13.71*  CALCIUM 9.3 8.8*  AST 21  --   ALT 20  --   ALKPHOS 174*  --   BILITOT 1.8*  --     Microbiology Results   Recent Results (from the past 240 hour(s))  Culture, blood (routine x 2)     Status: None (Preliminary result)   Collection Time: 01/25/15  8:47 AM  Result Value Ref Range Status   Specimen Description BLOOD  Final   Special Requests NONE  Final   Culture NO GROWTH 3 DAYS  Final   Report Status PENDING  Incomplete  Culture, blood (routine x 2)     Status: None (Preliminary result)   Collection  Time: 01/25/15  8:47 AM  Result Value Ref Range Status   Specimen Description BLOOD  Final   Special Requests NONE  Final   Culture NO GROWTH 3 DAYS  Final   Report Status PENDING  Incomplete  Wound culture     Status: None (Preliminary result)   Collection Time: 01/27/15  9:54 AM  Result Value Ref Range Status   Specimen Description SCROTUM  Final   Special Requests NONE  Final   Gram Stain PENDING  Incomplete   Culture   Final    RARE GROWTH UNIDENTIFIED ORGANISM IDENTIFICATION TO FOLLOW    Report Status PENDING  Incomplete    RADIOLOGY:  No results found.   Management plans discussed with the patient via interprete and  in agreement.  CODE STATUS:     Code Status Orders  Start     Ordered   01/25/15 1230  Full code   Continuous     01/25/15 1229      TOTAL TIME TAKING CARE OF THIS PATIENT: 40 minutes.    Taziyah Iannuzzi M.D on 01/28/2015 at 9:08 AM  Between 7am to 6pm - Pager - [phone] After 6pm go to [email or web address] - password [email or web address]  [facility]  Office  [phone]  CC: Primary care physician; No PCP Per Patient

## 2015-01-28 NOTE — Consult Note (Signed)
Urology Consult  Referring physician: Dr Edd Fabian Reason for referral: bleeding from scrotal incision  Chief Complaint: Scrotal bleeding  History of Present Illness: [name] is a pt with ESRD and right scrotal abscess s/p I&D who presented to the ER 24 hours after discharge with increased scrotal bleeding from I&D site. His I&D was 2 days ago. He denies any fevers/chills/sweats.  Past Medical History  Diagnosis Date  . Renal disorder   . Dialysis patient    Past Surgical History  Procedure Laterality Date  . Av fistula placement      Medications: I have reviewed the patient's current medications. Allergies: No Known Allergies  Family History  Problem Relation Age of Onset  . Kidney failure Father    Social History:  reports that he has been smoking Cigarettes.  He has been smoking about 0.25 packs per day. He does not have any smokeless tobacco history on file. He reports that he does not drink alcohol or use illicit drugs.  Review of Systems  All other systems reviewed and are negative.   Physical Exam:  Vital signs in last 24 hours: Temp:  [97.9 F (36.6 C)-98.5 F (36.9 C)] 97.9 F (36.6 C) (06/26 1516) Pulse Rate:  [63-84] 63 (06/26 1530) Resp:  [16-27] 19 (06/26 1530) BP: (137-172)/(62-84) 172/65 mmHg (06/26 1530) SpO2:  [99 %-100 %] 99 % (06/26 1530) Weight:  [76.289 kg (168 lb 3 oz)] 76.289 kg (168 lb 3 oz) (06/26 1516) Physical Exam  Constitutional: He is oriented to person, place, and time. He appears well-developed and well-nourished.  HENT:  Head: Normocephalic and atraumatic.  Eyes: EOM are normal. Pupils are equal, round, and reactive to light.  Neck: Normal range of motion. Neck supple. No thyromegaly present.  Cardiovascular: Normal rate and regular rhythm.   Respiratory: Effort normal and breath sounds normal.  GI: Soft. He exhibits no distension.  Genitourinary: Penis normal. No penile tenderness.  Right hemiscrotal incision draining blood. Packing  was not in place. Decreased edema compared to previous exam.  Musculoskeletal: Normal range of motion. He exhibits no edema.  Neurological: He is alert and oriented to person, place, and time.  Skin: Skin is warm and dry.  Psychiatric: He has a normal mood and affect. His behavior is normal. Judgment and thought content normal.    Laboratory Data:  Results for orders placed or performed during the hospital encounter of 01/28/15 (from the past 72 hour(s))  Type and screen     Status: None (Preliminary result)   Collection Time: 01/28/15  3:11 PM  Result Value Ref Range   ABO/RH(D) PENDING    Antibody Screen PENDING    Sample Expiration 01/31/2015   CBC     Status: Abnormal   Collection Time: 01/28/15  3:11 PM  Result Value Ref Range   WBC 5.5 3.8 - 10.6 K/uL   RBC 3.16 (L) 4.40 - 5.90 MIL/uL   Hemoglobin 10.7 (L) 13.0 - 18.0 g/dL   HCT 31.4 (L) 40.0 - 52.0 %   MCV 99.4 80.0 - 100.0 fL   MCH 33.7 26.0 - 34.0 pg   MCHC 33.9 32.0 - 36.0 g/dL   RDW 14.4 11.5 - 14.5 %   Platelets 143 (L) 150 - 440 K/uL  APTT     Status: Abnormal   Collection Time: 01/28/15  3:11 PM  Result Value Ref Range   aPTT 40 (H) 24 - 36 seconds    Comment:        IF BASELINE  aPTT IS ELEVATED, SUGGEST PATIENT RISK ASSESSMENT BE USED TO DETERMINE APPROPRIATE ANTICOAGULANT THERAPY.   Protime-INR     Status: None   Collection Time: 01/28/15  3:11 PM  Result Value Ref Range   Prothrombin Time 14.7 11.4 - 15.0 seconds   INR 1.13   Comprehensive metabolic panel     Status: Abnormal   Collection Time: 01/28/15  3:11 PM  Result Value Ref Range   Sodium 138 135 - 145 mmol/L   Potassium 4.7 3.5 - 5.1 mmol/L   Chloride 90 (L) 101 - 111 mmol/L   CO2 35 (H) 22 - 32 mmol/L   Glucose, Bld 148 (H) 65 - 99 mg/dL   BUN 31 (H) 6 - 20 mg/dL   Creatinine, Ser 7.08 (H) 0.61 - 1.24 mg/dL   Calcium 9.7 8.9 - 10.3 mg/dL   Total Protein 7.2 6.5 - 8.1 g/dL   Albumin 3.4 (L) 3.5 - 5.0 g/dL   AST 35 15 - 41 U/L   ALT 21 17  - 63 U/L   Alkaline Phosphatase 159 (H) 38 - 126 U/L   Total Bilirubin 1.3 (H) 0.3 - 1.2 mg/dL   GFR calc non Af Amer 8 (L) >60 mL/min   GFR calc Af Amer 10 (L) >60 mL/min    Comment: (NOTE) The eGFR has been calculated using the CKD EPI equation. This calculation has not been validated in all clinical situations. eGFR's persistently <60 mL/min signify possible Chronic Kidney Disease.    Anion gap 13 5 - 15   Recent Results (from the past 240 hour(s))  Culture, blood (routine x 2)     Status: None (Preliminary result)   Collection Time: 01/25/15  8:47 AM  Result Value Ref Range Status   Specimen Description BLOOD  Final   Special Requests NONE  Final   Culture NO GROWTH 3 DAYS  Final   Report Status PENDING  Incomplete  Culture, blood (routine x 2)     Status: None (Preliminary result)   Collection Time: 01/25/15  8:47 AM  Result Value Ref Range Status   Specimen Description BLOOD  Final   Special Requests NONE  Final   Culture NO GROWTH 3 DAYS  Final   Report Status PENDING  Incomplete  Wound culture     Status: None (Preliminary result)   Collection Time: 01/27/15  9:54 AM  Result Value Ref Range Status   Specimen Description SCROTUM  Final   Special Requests NONE  Final   Gram Stain FEW WBC SEEN FEW GRAM NEGATIVE RODS   Final   Culture   Final    RARE GROWTH STAPHYLOCOCCUS AUREUS IDENTIFICATION TO FOLLOW    Report Status PENDING  Incomplete   Creatinine:  Recent Labs  01/25/15 0846 01/26/15 0533 01/28/15 1511  CREATININE 11.72* 13.71* 7.08*    Impression/Assessment:  Right scrotal abscess  Plan:  1. Scrotal incision was packed with iodiform gauze to obtain hemostasis 2. followup 3 days for wound check 3. Pt instructed not to remove packing until followup  [name] L 01/28/2015, 4:31 PM

## 2015-01-28 NOTE — Discharge Instructions (Signed)
Follow-up with urology in 3 days as previously scheduled. Return to the emergency department if you have recurrent bleeding from your groin, severe or worsening swelling of your scrotum, abdominal pain, chest pain, difficulty breathing, lightheadedness, fainting, fevers, or for any other concern.

## 2015-01-28 NOTE — ED Provider Notes (Signed)
[facility] Emergency Department Provider Note  ____________________________________________  Time seen: Approximately 3:18 PM  I have reviewed the triage vital signs and the nursing notes.   HISTORY  Chief Complaint Post-op Problem  Caveat - history of present illness and review of systems limited by Spanish language barrier which is overcome with the assistance of Spanish interpreter.  HPI Mario [name] is a 44 y.o. male with history of end-stage renal disease on dialysis status post incision and drainage of right scrotal abscess on 01/27/2015 presents for evaluation of uncontrolled bleeding from incision in the right hemiscrotum, gradual onset, constant today. The patient was discharged from the medicine floor earlier today. He reports that he was having some bleeding at the time of discharge but on the way home he bled through 5 large towels. He has started to feel lightheaded. He is also complaining of right scrotal pain. No fever. No modifying factors. Current severity 10 and 10.   Past Medical History  Diagnosis Date  . Renal disorder   . Dialysis patient     Patient Active Problem List   Diagnosis Date Noted  . Scrotal infection 01/25/2015  . ESRD (end stage renal disease) 01/25/2015    Past Surgical History  Procedure Laterality Date  . Av fistula placement      Current Outpatient Rx  Name  Route  Sig  Dispense  Refill  . calcium acetate (PHOSLO) 667 MG tablet   Oral   Take 667 mg by mouth 3 (three) times daily with meals.         . clindamycin (CLEOCIN) 300 MG capsule   Oral   Take 2 capsules (600 mg total) by mouth 3 (three) times daily.   21 capsule   0   . HYDROcodone-acetaminophen (NORCO/VICODIN) 5-325 MG per tablet   Oral   Take 1-2 tablets by mouth every 4 (four) hours as needed for moderate pain.   30 tablet   0   . lanthanum (FOSRENOL) 1000 MG chewable tablet   Oral   Chew 1,000 mg by mouth daily.         .  metoprolol tartrate (LOPRESSOR) 25 MG tablet   Oral   Take 1 tablet (25 mg total) by mouth 2 (two) times daily.   60 tablet   0   . Multiple Vitamin (RENAL MULTIVITAMIN/ZINC PO)   Oral   Take 1 tablet by mouth daily.           Allergies Review of patient's allergies indicates no known allergies.  Family History  Problem Relation Age of Onset  . Kidney failure Father     Social History History  Substance Use Topics  . Smoking status: Current Every Day Smoker -- 0.25 packs/day    Types: Cigarettes  . Smokeless tobacco: Not on file  . Alcohol Use: No    Review of Systems Constitutional: No fever/chills Eyes: No visual changes. ENT: No sore throat. Cardiovascular: Denies chest pain. Respiratory: Denies shortness of breath. Gastrointestinal: No abdominal pain.  No nausea, no vomiting.  No diarrhea.   Genitourinary: Negative for dysuria. Musculoskeletal: Negative for back pain. Skin: Negative for rash. Neurological: Negative for headaches, focal weakness or numbness. 10-point ROS otherwise negative.  Caveat - history of present illness and review of systems limited by Spanish language barrier which is overcome with the assistance of Spanish interpreter.  ____________________________________________   PHYSICAL EXAM:  VITAL SIGNS: ED Triage Vitals  Enc Vitals Group     BP 01/28/15  1516 163/68 mmHg     Pulse Rate 01/28/15 1516 69     Resp 01/28/15 1516 20     Temp 01/28/15 1516 97.9 F (36.6 C)     Temp src --      SpO2 01/28/15 1516 100 %     Weight 01/28/15 1516 168 lb 3 oz (76.289 kg)     Height 01/28/15 1516 5\' 6"  (1.676 m)     Head Cir --      Peak Flow --      Pain Score --      Pain Loc --      Pain Edu? --      Excl. in [place]? --     Constitutional: Alert and oriented. Appears to be in distress, diaphoretic, crying. Eyes: Conjunctivae are normal. EOMI. Head: Atraumatic. Nose: No congestion/rhinnorhea. Mouth/Throat: Mucous membranes are moist.   Oropharynx non-erythematous. Neck: No stridor.  Cardiovascular: Normal rate, regular rhythm. Grossly normal heart sounds.  Good peripheral circulation. Respiratory: Normal respiratory effort.  No retractions. Lungs CTAB. Gastrointestinal: Soft and nontender. No distention. No abdominal bruits. No CVA tenderness. Genitourinary: moderate active bleeding from 2 cm incision in the right hemiscrotum, severe edema and enlargement of the right hemiscrotum Musculoskeletal: No lower extremity tenderness nor edema.  No joint effusions. Neurologic:  Normal speech and language. No gross focal neurologic deficits are appreciated. Speech is normal. No gait instability. Skin:  Skin is warm, dry and intact. No rash noted. Psychiatric: Mood and affect are normal. Speech and behavior are normal.  ____________________________________________   LABS (all labs ordered are listed, but only abnormal results are displayed)  Labs Reviewed  CBC - Abnormal; Notable for the following:    RBC 3.16 (*)    Hemoglobin 10.7 (*)    HCT 31.4 (*)    Platelets 143 (*)    All other components within normal limits  APTT - Abnormal; Notable for the following:    aPTT 40 (*)    All other components within normal limits  COMPREHENSIVE METABOLIC PANEL - Abnormal; Notable for the following:    Chloride 90 (*)    CO2 35 (*)    Glucose, Bld 148 (*)    BUN 31 (*)    Creatinine, Ser 7.08 (*)    Albumin 3.4 (*)    Alkaline Phosphatase 159 (*)    Total Bilirubin 1.3 (*)    GFR calc non Af Amer 8 (*)    GFR calc Af Amer 10 (*)    All other components within normal limits  PROTIME-INR  TYPE AND SCREEN  ABO/RH   ____________________________________________  EKG  ED ECG REPORT I, Joanne Gavel, the attending physician, personally viewed and interpreted this ECG.   Date: 01/28/2015  EKG Time: 15:33  Rate: 61  Rhythm: normal EKG, normal sinus rhythm  Axis: Normal  Intervals:none  ST&T Change: no acute ST segment  elevation  ____________________________________________  RADIOLOGY  none ____________________________________________   PROCEDURES  Procedure(s) performed: None  Critical Care performed: No  ____________________________________________   INITIAL IMPRESSION / ASSESSMENT AND PLAN / ED COURSE  Pertinent labs & imaging results that were available during my care of the patient were reviewed by me and considered in my medical decision making (see chart for details).  Mario [name] is a 44 y.o. male with history of end-stage renal disease on dialysis status post incision and drainage of right scrotal abscess on 01/27/2015 presents for evaluation of uncontrolled bleeding from incision in the right  hemiscrotum, gradual onset, constant today. On exam, he is tearful, diaphoretic, is safe beer bleeding from the right hemiscrotum and he has soaked through his pants. At this time bleeding is well-controlled with pressure however he is complaining of severe right scrotal pain and has significant swelling. Vital signs stable. Hgb decreased by 1 point on chart review. Discussed with Dr. Alyson Ingles of urology who will evaluate the patient in the ER.  ----------------------------------------- 5:48 PM on 01/28/2015 -----------------------------------------  Dr. Alyson Ingles of urology has evaluated the patient. He reports that the patient's packing was removed in error prior to discharge. He has replaced the packing, bleeding resolved, he reports the patient will follow-up with him in clinic in 3 days. Recommended no additional imaging at this time.Vital signs stable. DC with return precautions. ____________________________________________   FINAL CLINICAL IMPRESSION(S) / ED DIAGNOSES  Final diagnoses:  Post-operative wound abscess, initial encounter  Postoperative hemorrhage of subcutaneous tissue following non-dermatologic procedure      Joanne Gavel, MD 01/28/15 1749

## 2015-01-28 NOTE — Care Management Note (Signed)
Case Management Note  Patient Details  Name: Mario [name] MRN: [id number] Date of Birth: Sep 29, 1970  Subjective/Objective:        Completed a Financial Eligibility Application for uninsured [name] assisted by a Spanish language interpreter. Faxed and called a referral for home health RN for wound care and teaching to [place].            Action/Plan:   Expected Discharge Date:                  Expected Discharge Plan:     In-House Referral:     Discharge planning Services     Post Acute Care Choice:    Choice offered to:     DME Arranged:    DME Agency:     HH Arranged:    [place] Agency:     Status of Service:     Medicare Important Message Given:    Date Medicare IM Given:    Medicare IM give by:    Date Additional Medicare IM Given:    Additional Medicare Important Message give by:     If discussed at [place] of Stay Meetings, dates discussed:    Additional Comments:  [name], RN 01/28/2015, 12:54 PM

## 2015-01-28 NOTE — Discharge Instructions (Signed)
Hand hygiene. Do dressing changes per instruction. Take all medications as prescribed. Complete all of your antibiotic even if you are feeling better.  Drink plenty of fluids.  Notify MD if your symptoms appear to be getting worse or with any questions or concerns.

## 2015-01-28 NOTE — Progress Notes (Addendum)
Discharge instructions, prescriptions and instructions for follow up appointments was reviewed with patient.  Instructions for dressing change were provided while this RN changed the dressing.  A Spanish speaking interpreter was used. Patient voiced concern about being able to change dressing himself.  According to a male friend present, the patient lives with him, they are unrelated and he is out of the home during the day for work. Dr. Posey Pronto was contacted and notified.  She was also notified of the bloody drainage.  She will contact social work/care management about arranging home health.  Urologist on call, Dr. Alyson Ingles was also notified of bloody drainage.  The male friend that patient lives with also spoke to Dr. Posey Pronto on the phone to have additional questions answered.  Will continue with discharge.

## 2015-01-28 NOTE — Progress Notes (Signed)
[facility]  ROUNDING NOTE   Subjective:  Pt doing better s/p I and D of scrotal abscess. Had HD yesterday. In good spirits today.   Objective:  Vital signs in last 24 hours:  Temp:  [97.7 F (36.5 C)-98.5 F (36.9 C)] 98 F (36.7 C) (06/26 0812) Pulse Rate:  [72-85] 84 (06/26 0812) Resp:  [15-23] 16 (06/26 0812) BP: (136-204)/(43-86) 161/84 mmHg (06/26 0812) SpO2:  [99 %-100 %] 99 % (06/26 0812) Weight:  [78.4 kg (172 lb 13.5 oz)] 78.4 kg (172 lb 13.5 oz) (06/25 1415)  Weight change: -0.1 kg (-3.5 oz) Filed Weights   01/26/15 1411 01/27/15 1030 01/27/15 1415  Weight: 78.3 kg (172 lb 9.9 oz) 79.8 kg (175 lb 14.8 oz) 78.4 kg (172 lb 13.5 oz)    Intake/Output: I/O last 3 completed shifts: In: 245 [IV Piggyback:245] Out: 1000 [Other:1000]   Intake/Output this shift:  Total I/O In: 240 [P.O.:240] Out: -   Physical Exam: General: NAD  Head: Normocephalic, atraumatic. Moist oral mucosal membranes  Eyes: Anicteric  Neck: Supple, trachea midline  Lungs:  Clear to auscultation normal effort  Heart: Regular rate and rhythm  Abdomen:  Soft, nontender, BS present  Extremities:  1+ peripheral edema.  Neurologic: Nonfocal, moving all four extremities  Skin: No lesions  Access: LUE AVF    Basic Metabolic Panel:  Recent Labs Lab 01/25/15 0846 01/26/15 0533  NA 130* 129*  K 4.4 5.2*  CL 87* 89*  CO2 26 25  GLUCOSE 98 90  BUN 54* 69*  CREATININE 11.72* 13.71*  CALCIUM 9.3 8.8*    Liver Function Tests:  Recent Labs Lab 01/25/15 0846  AST 21  ALT 20  ALKPHOS 174*  BILITOT 1.8*  PROT 8.1  ALBUMIN 4.0   No results for input(s): LIPASE, AMYLASE in the last 168 hours. No results for input(s): AMMONIA in the last 168 hours.  CBC:  Recent Labs Lab 01/25/15 0846 01/26/15 0533  WBC 11.5* 10.5  NEUTROABS 7.9*  --   HGB 12.6* 11.7*  HCT 37.3* 34.9*  MCV 99.9 100.1*  PLT 107* 103*    Cardiac Enzymes: No results for input(s): CKTOTAL,  CKMB, CKMBINDEX, TROPONINI in the last 168 hours.  BNP: Invalid input(s): POCBNP  CBG: No results for input(s): GLUCAP in the last 168 hours.  Microbiology: Results for orders placed or performed during the hospital encounter of 01/25/15  Culture, blood (routine x 2)     Status: None (Preliminary result)   Collection Time: 01/25/15  8:47 AM  Result Value Ref Range Status   Specimen Description BLOOD  Final   Special Requests NONE  Final   Culture NO GROWTH 3 DAYS  Final   Report Status PENDING  Incomplete  Culture, blood (routine x 2)     Status: None (Preliminary result)   Collection Time: 01/25/15  8:47 AM  Result Value Ref Range Status   Specimen Description BLOOD  Final   Special Requests NONE  Final   Culture NO GROWTH 3 DAYS  Final   Report Status PENDING  Incomplete  Wound culture     Status: None (Preliminary result)   Collection Time: 01/27/15  9:54 AM  Result Value Ref Range Status   Specimen Description SCROTUM  Final   Special Requests NONE  Final   Gram Stain PENDING  Incomplete   Culture   Final    RARE GROWTH UNIDENTIFIED ORGANISM IDENTIFICATION TO FOLLOW    Report Status PENDING  Incomplete  Coagulation Studies: No results for input(s): LABPROT, INR in the last 72 hours.  Urinalysis: No results for input(s): COLORURINE, LABSPEC, PHURINE, GLUCOSEU, HGBUR, BILIRUBINUR, KETONESUR, PROTEINUR, UROBILINOGEN, NITRITE, LEUKOCYTESUR in the last 72 hours.  Invalid input(s): APPERANCEUR    Imaging: No results found.   Medications:     . calcium acetate  2,001 mg Oral TID WC  . clindamycin  600 mg Oral 3 times per day  . heparin  5,000 Units Subcutaneous 3 times per day  . metoprolol tartrate  25 mg Oral BID   acetaminophen **OR** acetaminophen, HYDROcodone-acetaminophen, morphine injection, ondansetron **OR** ondansetron (ZOFRAN) IV  Assessment/ Plan:  44 y.o. male with a PMHX of end-stage renal disease on hemodialysis Tuesday, Thursday, Saturday,  hypertension, anemia chronic kidney disease, secondary hyperparathyroidism, left upper extremity AV fistula, was admitted to [facility] on 01/25/2015 with scrotal abscess.  1. End-stage renal disease on renal doses Tuesday, Thursday, Saturday.  Pt completed HD yesterday, no acute indication for HD today, will have HD again on Tuesday as outpt.  2. Scrotal abscess/cellulitis. s/p I&D 01/26/15. Pt started on clindamycin for outpt therapy.  3. Anemia chronic kidney disease. hgb to be followed as outpt, epogen was held during this admission.  4. Secondary hyperparathyroidism.continue phoslo 3 tabs po tid/wm.    LOS: 3 Bernabe Dorce 6/26/201611:13 AM

## 2015-01-30 LAB — WOUND CULTURE

## 2015-01-30 LAB — CULTURE, BLOOD (ROUTINE X 2)
CULTURE: NO GROWTH
Culture: NO GROWTH

## 2015-02-06 ENCOUNTER — Ambulatory Visit: Payer: Self-pay | Admitting: Urology

## 2015-02-15 NOTE — Op Note (Signed)
Preoperative diagnosis: scrotal abscess  Postoperative diagnosis: Same  Procedure: I&D of scrotal abscess  Attending: Nicolette Bang, MD  Anesthesia: local  History of blood loss: Minimal  Antibiotics: non  Drains: 1/4 iodiform gauze  Specimens: wound culture  Findings: 1 cm scrotal abscess  Indications: Patient is a 44 year old male with a history of scrotal swelling and abscess.  We discussed the treatment options including antibiotic therapy versus incision and drainage of scrotal abscess and the patient wishes to proceed with I&D of scrotal abscess Procedure in detail: Prior to procedure consent was obtained. a brief timeout was done to ensure correct patient, correct procedure, correct site.  local anesthesia was administered and patient was placed in supine position.  His genitalia was then prepped and draped in usual sterile fashion.  A 2 cm incision was made over the area of induration. Purulent material drained and it was sent for culture. Multiple areas of loculation were opened bluntly. We then packed the incision with iodiform gauze.  A dressing was then applied to the incision and this then concluded the procedure which was well tolerated by the patient.  Complications: None  Condition: Stable.  Plan: Patient is to be discharged home.  He is to follow up in 1 week  for wound check.

## 2016-01-04 ENCOUNTER — Emergency Department
Admission: EM | Admit: 2016-01-04 | Discharge: 2016-01-04 | Disposition: A | Discharge status: Home / Self Care | Payer: Self-pay | Attending: Emergency Medicine | Admitting: Emergency Medicine

## 2016-01-04 ENCOUNTER — Emergency Department: Payer: Self-pay

## 2016-01-04 ENCOUNTER — Encounter: Payer: Self-pay | Admitting: Radiology

## 2016-01-04 DIAGNOSIS — R197 Diarrhea, unspecified: Secondary | ICD-10-CM | POA: Insufficient documentation

## 2016-01-04 DIAGNOSIS — Z79899 Other long term (current) drug therapy: Secondary | ICD-10-CM | POA: Insufficient documentation

## 2016-01-04 DIAGNOSIS — R112 Nausea with vomiting, unspecified: Secondary | ICD-10-CM | POA: Insufficient documentation

## 2016-01-04 DIAGNOSIS — Z992 Dependence on renal dialysis: Secondary | ICD-10-CM | POA: Insufficient documentation

## 2016-01-04 DIAGNOSIS — N186 End stage renal disease: Secondary | ICD-10-CM | POA: Insufficient documentation

## 2016-01-04 DIAGNOSIS — R0602 Shortness of breath: Secondary | ICD-10-CM | POA: Insufficient documentation

## 2016-01-04 DIAGNOSIS — R911 Solitary pulmonary nodule: Secondary | ICD-10-CM | POA: Insufficient documentation

## 2016-01-04 DIAGNOSIS — F17210 Nicotine dependence, cigarettes, uncomplicated: Secondary | ICD-10-CM | POA: Insufficient documentation

## 2016-01-04 HISTORY — DX: Disorder of kidney and ureter, unspecified: N28.9

## 2016-01-04 LAB — COMPREHENSIVE METABOLIC PANEL
ALK PHOS: 176 U/L — AB (ref 38–126)
ALT: 60 U/L (ref 17–63)
AST: 102 U/L — ABNORMAL HIGH (ref 15–41)
Albumin: 4.1 g/dL (ref 3.5–5.0)
Anion gap: 13 (ref 5–15)
BUN: 21 mg/dL — ABNORMAL HIGH (ref 6–20)
CALCIUM: 10 mg/dL (ref 8.9–10.3)
CO2: 28 mmol/L (ref 22–32)
Chloride: 93 mmol/L — ABNORMAL LOW (ref 101–111)
Creatinine, Ser: 8.45 mg/dL — ABNORMAL HIGH (ref 0.61–1.24)
GFR calc non Af Amer: 7 mL/min — ABNORMAL LOW (ref >60)
GFR, EST AFRICAN AMERICAN: 8 mL/min — AB (ref >60)
Glucose, Bld: 91 mg/dL (ref 65–99)
Potassium: 4.1 mmol/L (ref 3.5–5.1)
SODIUM: 134 mmol/L — AB (ref 135–145)
Total Bilirubin: 1.7 mg/dL — ABNORMAL HIGH (ref 0.3–1.2)
Total Protein: 7.9 g/dL (ref 6.5–8.1)

## 2016-01-04 LAB — CBC
HCT: 37.5 % — ABNORMAL LOW (ref 40.0–52.0)
Hemoglobin: 12.7 g/dL — ABNORMAL LOW (ref 13.0–18.0)
MCH: 33.3 pg (ref 26.0–34.0)
MCHC: 33.9 g/dL (ref 32.0–36.0)
MCV: 98.3 fL (ref 80.0–100.0)
Platelets: 104 10*3/uL — ABNORMAL LOW (ref 150–440)
RBC: 3.82 MIL/uL — ABNORMAL LOW (ref 4.40–5.90)
RDW: 15.4 % — ABNORMAL HIGH (ref 11.5–14.5)
WBC: 3.3 10*3/uL — ABNORMAL LOW (ref 3.8–10.6)

## 2016-01-04 LAB — TROPONIN I: Troponin I: 0.03 ng/mL (ref <0.031)

## 2016-01-04 MED ORDER — IOPAMIDOL (ISOVUE-300) INJECTION 61%
75.0000 mL | Freq: Once | INTRAVENOUS | Status: AC | PRN
  Administered 2016-01-04: 75 mL via INTRAVENOUS

## 2016-01-04 NOTE — ED Notes (Signed)
Pt states that he hasn't been feeling well for the past 15 days, states fever, vomiting and diarrhea, states is a dialysis patient and last treatment was yesterday

## 2016-01-04 NOTE — Discharge Instructions (Signed)
Your CT scan of the chest does not show any blood clots in the lungs. There is a pulmonary nodule which should be followed up by a primary care doctor. Be sure to continue your usual dialysis tomorrow.  Nuseas y Vmitos (Nausea and Vomiting) La nusea es la sensacin de Tree surgeon en el estmago o de la necesidad de vomitar. El vmito es un reflejo por el que los contenidos del estmago salen por la boca. El vmito puede ocasionar prdida de lquidos del organismo (deshidratacin). Los nios y los [employer] pueden deshidratarse rpidamente (en especial si tambin tienen diarrea). Las nuseas y los vmitos son sntoma de un trastorno o enfermedad. Es importante Energy manager causa de los sntomas. CAUSAS  Irritacin directa de la membrana que cubre el [place]. Esta irritacin puede ser resultado del aumento de la produccin de cido, (reflujo gastroesofgico), infecciones, intoxicacin alimentaria, ciertos medicamentos (como antinflamatorios no esteroideos), consumo de alcohol o de tabaco.  Seales del cerebro.Estas seales pueden ser un dolor de cabeza, exposicin al calor, trastornos del odo interno, aumento de la presin en el cerebro por lesiones, infeccin, un tumor o conmocin cerebral, estmulos emocionales o problemas metablicos.  Una obstruccin en el tracto gastrointestinal (obstruccin intestinal).  Ciertas enfermedades como la diabetes, problemas en la vescula biliar, apendicitis, problemas renales, cncer, sepsis, sntomas atpicos de infarto o trastornos alimentarios.  Tratamientos mdicos como la quimioterapia y la radiacin.  Medicamentos que inducen al sueo (anestesia general) durante Clementeen Hoof. DIAGNSTICO  El mdico podr solicitarle algunos anlisis si los problemas no mejoran luego de algunos das. Tambin podrn pedirle anlisis si los sntomas son graves o si el motivo de los vmitos o las nuseas no est claro. Los [employer] ser:   Anlisis de  [place].  Anlisis de [place].  Pruebas de materia fecal.  Cultivos (para buscar evidencias de infeccin).  Radiografas u otros estudios por imgenes. Los [employer] de las pruebas lo ayudarn al mdico a tomar decisiones acerca del mejor curso de tratamiento o la necesidad de [employer].  TRATAMIENTO  Debe estar bien hidratado. Beba con frecuencia pequeas cantidades de lquido.Puede beber agua, bebidas deportivas, caldos claros o comer pequeos trocitos de hielo o gelatina para mantenerse hidratado.Cuando coma, hgalo lentamente para evitar las nuseas.Hay medicamentos para evitar las nuseas que pueden aliviarlo.  INSTRUCCIONES PARA EL CUIDADO DOMICILIARIO  Si su mdico le prescribe medicamentos tmelos como se le haya indicado.  Si no tiene hambre, no se fuerce a comer. Sin embargo, es necesario que tome lquidos.  Si tiene hambre alimntese con una dieta normal, a menos que el mdico le indique otra cosa.  Los mejores alimentos son Ardelia Mems combinacin de carbohidratos complejos (arroz, trigo, papas, pan), carnes magras, yogur, frutas y Photographer.  Evite los alimentos ricos en grasas porque dificultan la digestin.  Beba gran cantidad de lquido para mantener la orina de tono claro o color amarillo plido.  Si est deshidratado, consulte a su mdico para que le d instrucciones especficas para volver a hidratarlo. Los signos de deshidratacin son:  Doristine Section sed.  Labios y boca secos.  Mareos.  Elmon Else.  Disminucin de la frecuencia y cantidad de la [place].  Confusin.  Tiene el pulso o la respiracin acelerados. SOLICITE ATENCIN MDICA DE INMEDIATO SI:  Vomita sangre o algo similar a la borra del caf.  La materia fecal (heces) es negra o tiene [place].  Sufre una cefalea grave o rigidez en el cuello.  Se siente confundido.  Siente dolor abdominal intenso.  Tiene  dolor en el pecho o dificultad para respirar.  No orina por 8 horas.  Tiene la piel  fra y pegajosa.  Sigue vomitando durante ms de 24 a 48 horas.  Tiene fiebre. ASEGRESE QUE:   Comprende estas instrucciones.  Controlar su enfermedad.  Solicitar ayuda inmediatamente si no mejora o si empeora.   Esta informacin no tiene Marine scientist el consejo del mdico. Asegrese de hacerle al mdico cualquier pregunta que tenga.   Document Released: 08/10/2007 Document Revised: 10/13/2011 Elsevier Interactive Patient Education [employer].

## 2016-01-04 NOTE — ED Provider Notes (Signed)
[facility] Emergency Department Provider Note   ____________________________________________  Time seen: Approximately 245 PM  I have reviewed the triage vital signs and the nursing notes.   HISTORY  Chief Complaint Weakness Interpreter utilized for [place] translation.  HPI Mario [name] is a 45 y.o. male with a history of end-stage renal disease on dialysis was presenting to the emergency department today for 15 days of cough, nausea vomiting and diarrhea. He says that he has a "burning" in his abdomen but denies any pain. Denies any chest pain although he does say that he has shortness of breath. Says that he is also having fevers in the evenings. No known sick contacts. Was dialyzed yesterday. Says that the stool is "black."Patient says he does not make urine.   Past Medical History  Diagnosis Date  . Renal disorder   . Dialysis patient ([place])   . Renal insufficiency     Patient Active Problem List   Diagnosis Date Noted  . Scrotal infection 01/25/2015  . ESRD (end stage renal disease) ([place]) 01/25/2015    Past Surgical History  Procedure Laterality Date  . Av fistula placement      Current Outpatient Rx  Name  Route  Sig  Dispense  Refill  . calcium acetate (PHOSLO) 667 MG tablet   Oral   Take 667 mg by mouth 3 (three) times daily with meals.         . clindamycin (CLEOCIN) 300 MG capsule   Oral   Take 2 capsules (600 mg total) by mouth 3 (three) times daily.   21 capsule   0   . HYDROcodone-acetaminophen (NORCO/VICODIN) 5-325 MG per tablet   Oral   Take 1-2 tablets by mouth every 4 (four) hours as needed for moderate pain.   30 tablet   0   . lanthanum (FOSRENOL) 1000 MG chewable tablet   Oral   Chew 1,000 mg by mouth daily.         . metoprolol tartrate (LOPRESSOR) 25 MG tablet   Oral   Take 1 tablet (25 mg total) by mouth 2 (two) times daily.   60 tablet   0   . Multiple Vitamin (RENAL MULTIVITAMIN/ZINC PO)   Oral   Take 1 tablet by mouth daily.           Allergies Review of patient's allergies indicates no known allergies.  Family History  Problem Relation Age of Onset  . Kidney failure Father     Social History Social History  Substance Use Topics  . Smoking status: Current Every Day Smoker -- 0.25 packs/day    Types: Cigarettes  . Smokeless tobacco: None  . Alcohol Use: No    Review of Systems Constitutional: No fever/chills Eyes: No visual changes. ENT: No sore throat. Cardiovascular: Denies chest pain. Respiratory: As above Gastrointestinal: No abdominal pain.   No constipation. Genitourinary: Negative for dysuria. Musculoskeletal: Negative for back pain. Skin: Negative for rash. Neurological: Negative for headaches, focal weakness or numbness.  10-point ROS otherwise negative.  ____________________________________________   PHYSICAL EXAM:  VITAL SIGNS: ED Triage Vitals  Enc Vitals Group     BP 01/04/16 1156 185/80 mmHg     Pulse Rate 01/04/16 1156 71     Resp 01/04/16 1156 18     Temp 01/04/16 1156 98.1 F (36.7 C)     Temp Source 01/04/16 1156 Oral     SpO2 01/04/16 1156 100 %     Weight 01/04/16 1156 180  lb (81.647 kg)     Height 01/04/16 1156 5' 2.99" (1.6 m)     Head Cir --      Peak Flow --      Pain Score 01/04/16 1156 8     Pain Loc --      Pain Edu? --      Excl. in [place]? --     Constitutional: Alert and oriented. Well appearing and in no acute distress. Eyes: Conjunctivae are normal. PERRL. EOMI. Head: Atraumatic. Nose: No congestion/rhinnorhea. Mouth/Throat: Mucous membranes are moist.   Neck: No stridor.   Cardiovascular: Normal rate, regular rhythm. Grossly normal heart sounds.  Good peripheral circulation. Left sided dialysis fistula with palpable thrill.  Respiratory: Normal respiratory effort.  No retractions. Lungs CTAB. Gastrointestinal: Soft and nontender. No distention.No CVA tenderness. Rectal exam with a small amount of  diarrhea on the glove is heme-negative. Greenish in color. Musculoskeletal: No lower extremity tenderness nor edema.  No joint effusions. Neurologic:  Normal speech and language. No gross focal neurologic deficits are appreciated. No gait instability. Skin:  Skin is warm, dry and intact. No rash noted. Psychiatric: Mood and affect are normal. Speech and behavior are normal.  ____________________________________________   LABS (all labs ordered are listed, but only abnormal results are displayed)  Labs Reviewed  CBC - Abnormal; Notable for the following:    WBC 3.3 (*)    RBC 3.82 (*)    Hemoglobin 12.7 (*)    HCT 37.5 (*)    RDW 15.4 (*)    Platelets 104 (*)    All other components within normal limits  COMPREHENSIVE METABOLIC PANEL - Abnormal; Notable for the following:    Sodium 134 (*)    Chloride 93 (*)    BUN 21 (*)    Creatinine, Ser 8.45 (*)    AST 102 (*)    Alkaline Phosphatase 176 (*)    Total Bilirubin 1.7 (*)    GFR calc non Af Amer 7 (*)    GFR calc Af Amer 8 (*)    All other components within normal limits  TROPONIN I   ____________________________________________  EKG  ED ECG REPORT I, Doran Stabler, the attending physician, personally viewed and interpreted this ECG.   Date: 01/04/2016  EKG Time: 1204  Rate: 71  Rhythm: normal sinus rhythm  Axis: Normal axis  Intervals:none  ST&T Change: No ST segment elevation or depression. No abnormal T-wave inversion.  ____________________________________________  [id number]  DG Chest 2 View (Final result) Result time: 01/04/16 13:32:52   Final result by Rad Results In Interface (01/04/16 13:32:52)   Narrative:   CLINICAL DATA: Chest pain and fever for 1 week  EXAM: CHEST 2 VIEW  COMPARISON: None.  FINDINGS: There is opacity either in or overlying the posterior mediastinum on the left medially is not possible to ascertain whether this area represents airspace consolidation versus mass or  even adenopathy overlying the medial left base region. Lungs elsewhere clear. Heart size and pulmonary vascularity are normal. No adenopathy. No bone lesions are evident.  IMPRESSION: Asymmetric opacity either adjacent to or overlying the posterior mediastinum on the left medially. Differential considerations for this opacity include pneumonia, mass, or possibly adenopathy in this area cannot be excluded radiographically. Chest CT with contrast advised to further evaluate in this regard. Lungs elsewhere clear.   Electronically Signed By: Lowella Grip III M.D. On: 01/04/2016 13:32        ____________________________________________   PROCEDURES   ____________________________________________  INITIAL IMPRESSION / ASSESSMENT AND PLAN / ED COURSE  Pertinent labs & imaging results that were available during my care of the patient were reviewed by me and considered in my medical decision making (see chart for details).  ----------------------------------------- 3:22 PM on 01/04/2016 -----------------------------------------  Patient with him to give [place] finding on his chest x-ray. We'll proceed with CAT scan of the chest with contrast. Signed out to Dr. Joni Fears. ____________________________________________   FINAL CLINICAL IMPRESSION(S) / ED DIAGNOSES  Final diagnoses:  Shortness of breath   Nausea vomiting and diarrhea. Cough.   NEW MEDICATIONS STARTED DURING THIS VISIT:  New Prescriptions   No medications on file     Note:  This document was prepared using Dragon voice recognition software and may include unintentional dictation errors.    Orbie Pyo, MD 01/04/16 [phone]

## 2016-01-04 NOTE — ED Provider Notes (Signed)
CT scan negative except for small pulmonary nodule. Patient informed, follow up with primary care. Continue usual dialysis tomorrow. Vital signs stable, workup otherwise unremarkable. Consistent with end-stage renal disease.  Carrie Mew, MD 01/04/16 [phone]

## 2016-01-04 NOTE — ED Notes (Signed)
Pt states via medical interpreter, he has felt ill for the past 15 days.  C/o black stools as well as burning in abdomen.  Pt c/o difficulty breathing but no chest pain.

## 2016-01-04 NOTE — ED Notes (Signed)
Pt transported to CT ?

## 2016-04-11 ENCOUNTER — Emergency Department: Payer: Medicaid Other

## 2016-04-11 ENCOUNTER — Other Ambulatory Visit: Payer: Self-pay

## 2016-04-11 ENCOUNTER — Encounter: Payer: Self-pay | Admitting: Emergency Medicine

## 2016-04-11 ENCOUNTER — Inpatient Hospital Stay
Admission: EM | Admit: 2016-04-11 | Discharge: 2016-04-16 | DRG: 291 | Disposition: A | Discharge status: Home / Self Care | Payer: Medicaid Other | Attending: Specialist | Admitting: Specialist

## 2016-04-11 DIAGNOSIS — K7011 Alcoholic hepatitis with ascites: Secondary | ICD-10-CM | POA: Diagnosis present

## 2016-04-11 DIAGNOSIS — T447X6A Underdosing of beta-adrenoreceptor antagonists, initial encounter: Secondary | ICD-10-CM | POA: Diagnosis present

## 2016-04-11 DIAGNOSIS — Z992 Dependence on renal dialysis: Secondary | ICD-10-CM

## 2016-04-11 DIAGNOSIS — F17210 Nicotine dependence, cigarettes, uncomplicated: Secondary | ICD-10-CM | POA: Diagnosis present

## 2016-04-11 DIAGNOSIS — F10239 Alcohol dependence with withdrawal, unspecified: Secondary | ICD-10-CM | POA: Diagnosis present

## 2016-04-11 DIAGNOSIS — R140 Abdominal distension (gaseous): Secondary | ICD-10-CM

## 2016-04-11 DIAGNOSIS — Z841 Family history of disorders of kidney and ureter: Secondary | ICD-10-CM

## 2016-04-11 DIAGNOSIS — R079 Chest pain, unspecified: Secondary | ICD-10-CM

## 2016-04-11 DIAGNOSIS — Z833 Family history of diabetes mellitus: Secondary | ICD-10-CM

## 2016-04-11 DIAGNOSIS — E8770 Fluid overload, unspecified: Secondary | ICD-10-CM | POA: Diagnosis present

## 2016-04-11 DIAGNOSIS — N186 End stage renal disease: Secondary | ICD-10-CM | POA: Diagnosis present

## 2016-04-11 DIAGNOSIS — Y929 Unspecified place or not applicable: Secondary | ICD-10-CM

## 2016-04-11 DIAGNOSIS — D631 Anemia in chronic kidney disease: Secondary | ICD-10-CM | POA: Diagnosis present

## 2016-04-11 DIAGNOSIS — N2581 Secondary hyperparathyroidism of renal origin: Secondary | ICD-10-CM | POA: Diagnosis present

## 2016-04-11 DIAGNOSIS — K7031 Alcoholic cirrhosis of liver with ascites: Secondary | ICD-10-CM | POA: Diagnosis present

## 2016-04-11 DIAGNOSIS — I132 Hypertensive heart and chronic kidney disease with heart failure and with stage 5 chronic kidney disease, or end stage renal disease: Principal | ICD-10-CM | POA: Diagnosis present

## 2016-04-11 DIAGNOSIS — D6959 Other secondary thrombocytopenia: Secondary | ICD-10-CM | POA: Diagnosis present

## 2016-04-11 DIAGNOSIS — I5023 Acute on chronic systolic (congestive) heart failure: Secondary | ICD-10-CM | POA: Diagnosis present

## 2016-04-11 DIAGNOSIS — Z9115 Patient's noncompliance with renal dialysis: Secondary | ICD-10-CM

## 2016-04-11 DIAGNOSIS — R188 Other ascites: Secondary | ICD-10-CM

## 2016-04-11 HISTORY — DX: Essential (primary) hypertension: I10

## 2016-04-11 HISTORY — DX: Chest pain, unspecified: R07.9

## 2016-04-11 LAB — LIPASE, BLOOD: LIPASE: 50 U/L (ref 11–51)

## 2016-04-11 LAB — CBC
HCT: 33.1 % — ABNORMAL LOW (ref 40.0–52.0)
HEMOGLOBIN: 11.2 g/dL — AB (ref 13.0–18.0)
MCH: 34.9 pg — AB (ref 26.0–34.0)
MCHC: 33.9 g/dL (ref 32.0–36.0)
MCV: 102.7 fL — AB (ref 80.0–100.0)
Platelets: 102 10*3/uL — ABNORMAL LOW (ref 150–440)
RBC: 3.22 MIL/uL — AB (ref 4.40–5.90)
RDW: 15.6 % — ABNORMAL HIGH (ref 11.5–14.5)
WBC: 3.9 10*3/uL (ref 3.8–10.6)

## 2016-04-11 LAB — LIPID PANEL
CHOL/HDL RATIO: 2.1 ratio
Cholesterol: 152 mg/dL (ref 0–200)
HDL: 71 mg/dL (ref >40)
LDL CALC: 63 mg/dL (ref 0–99)
TRIGLYCERIDES: 91 mg/dL (ref <150)
VLDL: 18 mg/dL (ref 0–40)

## 2016-04-11 LAB — HEPATIC FUNCTION PANEL
ALBUMIN: 3.7 g/dL (ref 3.5–5.0)
ALT: 30 U/L (ref 17–63)
AST: 54 U/L — AB (ref 15–41)
Alkaline Phosphatase: 192 U/L — ABNORMAL HIGH (ref 38–126)
BILIRUBIN DIRECT: 0.5 mg/dL (ref 0.1–0.5)
BILIRUBIN TOTAL: 1.6 mg/dL — AB (ref 0.3–1.2)
Indirect Bilirubin: 1.1 mg/dL — ABNORMAL HIGH (ref 0.3–0.9)
Total Protein: 7.7 g/dL (ref 6.5–8.1)

## 2016-04-11 LAB — TROPONIN I
TROPONIN I: 0.05 ng/mL — AB (ref <0.03)
TROPONIN I: 0.06 ng/mL — AB (ref <0.03)
Troponin I: 0.06 ng/mL (ref <0.03)

## 2016-04-11 LAB — BASIC METABOLIC PANEL
ANION GAP: 18 — AB (ref 5–15)
BUN: 39 mg/dL — ABNORMAL HIGH (ref 6–20)
CHLORIDE: 93 mmol/L — AB (ref 101–111)
CO2: 23 mmol/L (ref 22–32)
Calcium: 9.4 mg/dL (ref 8.9–10.3)
Creatinine, Ser: 12.59 mg/dL — ABNORMAL HIGH (ref 0.61–1.24)
GFR calc non Af Amer: 4 mL/min — ABNORMAL LOW (ref >60)
GFR, EST AFRICAN AMERICAN: 5 mL/min — AB (ref >60)
Glucose, Bld: 97 mg/dL (ref 65–99)
Potassium: 4.7 mmol/L (ref 3.5–5.1)
SODIUM: 134 mmol/L — AB (ref 135–145)

## 2016-04-11 LAB — MRSA PCR SCREENING: MRSA by PCR: NEGATIVE

## 2016-04-11 LAB — BRAIN NATRIURETIC PEPTIDE: B Natriuretic Peptide: 1988 pg/mL — ABNORMAL HIGH (ref 0.0–100.0)

## 2016-04-11 MED ORDER — MORPHINE SULFATE (PF) 2 MG/ML IV SOLN
INTRAVENOUS | Status: AC
  Administered 2016-04-11: 2 mg via INTRAVENOUS
  Filled 2016-04-11: qty 1

## 2016-04-11 MED ORDER — FOLIC ACID 1 MG PO TABS
1.0000 mg | ORAL_TABLET | Freq: Every day | ORAL | Status: DC
  Administered 2016-04-11 – 2016-04-16 (×6): 1 mg via ORAL
  Filled 2016-04-11 (×6): qty 1

## 2016-04-11 MED ORDER — METOPROLOL TARTRATE 25 MG PO TABS
25.0000 mg | ORAL_TABLET | Freq: Two times a day (BID) | ORAL | Status: DC
Start: 2016-04-11 — End: 2016-04-16
  Administered 2016-04-11 – 2016-04-16 (×8): 25 mg via ORAL
  Filled 2016-04-11 (×10): qty 1

## 2016-04-11 MED ORDER — HYDROCODONE-ACETAMINOPHEN 5-325 MG PO TABS
1.0000 | ORAL_TABLET | ORAL | Status: DC | PRN
  Administered 2016-04-11: 1 via ORAL
  Administered 2016-04-12: 2 via ORAL
  Filled 2016-04-11: qty 2
  Filled 2016-04-11: qty 1

## 2016-04-11 MED ORDER — NITROGLYCERIN 0.4 MG SL SUBL
0.4000 mg | SUBLINGUAL_TABLET | SUBLINGUAL | Status: DC | PRN
  Administered 2016-04-11 – 2016-04-12 (×6): 0.4 mg via SUBLINGUAL
  Filled 2016-04-11: qty 3
  Filled 2016-04-11 (×2): qty 1

## 2016-04-11 MED ORDER — THIAMINE HCL 100 MG/ML IJ SOLN
100.0000 mg | Freq: Every day | INTRAMUSCULAR | Status: DC
  Administered 2016-04-12: 100 mg via INTRAVENOUS
  Filled 2016-04-11: qty 2

## 2016-04-11 MED ORDER — HEPARIN SODIUM (PORCINE) 5000 UNIT/ML IJ SOLN
5000.0000 [IU] | Freq: Three times a day (TID) | INTRAMUSCULAR | Status: DC
  Administered 2016-04-11 – 2016-04-16 (×11): 5000 [IU] via SUBCUTANEOUS
  Filled 2016-04-11 (×11): qty 1

## 2016-04-11 MED ORDER — MORPHINE SULFATE (PF) 4 MG/ML IV SOLN
4.0000 mg | Freq: Once | INTRAVENOUS | Status: AC
  Administered 2016-04-11: 4 mg via INTRAVENOUS
  Filled 2016-04-11: qty 1

## 2016-04-11 MED ORDER — LANTHANUM CARBONATE 500 MG PO CHEW
1000.0000 mg | CHEWABLE_TABLET | Freq: Every day | ORAL | Status: DC
Start: 2016-04-12 — End: 2016-04-16
  Administered 2016-04-12 – 2016-04-16 (×5): 1000 mg via ORAL
  Filled 2016-04-11 (×6): qty 2

## 2016-04-11 MED ORDER — LORAZEPAM 2 MG/ML IJ SOLN
2.0000 mg | INTRAMUSCULAR | Status: AC | PRN
  Administered 2016-04-13 – 2016-04-14 (×4): 2 mg via INTRAVENOUS
  Filled 2016-04-11 (×5): qty 1

## 2016-04-11 MED ORDER — RENA-VITE PO TABS
1.0000 | ORAL_TABLET | Freq: Every day | ORAL | Status: DC
  Administered 2016-04-11 – 2016-04-16 (×6): 1 via ORAL
  Filled 2016-04-11 (×6): qty 1

## 2016-04-11 MED ORDER — ONDANSETRON HCL 4 MG/2ML IJ SOLN
4.0000 mg | Freq: Four times a day (QID) | INTRAMUSCULAR | Status: DC | PRN
  Administered 2016-04-12 – 2016-04-15 (×2): 4 mg via INTRAVENOUS
  Filled 2016-04-11 (×2): qty 2

## 2016-04-11 MED ORDER — ONDANSETRON HCL 4 MG/2ML IJ SOLN
4.0000 mg | Freq: Once | INTRAMUSCULAR | Status: AC
  Administered 2016-04-11: 4 mg via INTRAVENOUS
  Filled 2016-04-11: qty 2

## 2016-04-11 MED ORDER — VITAMIN B-1 100 MG PO TABS
100.0000 mg | ORAL_TABLET | Freq: Every day | ORAL | Status: DC
  Administered 2016-04-13 – 2016-04-16 (×4): 100 mg via ORAL
  Filled 2016-04-11 (×5): qty 1

## 2016-04-11 MED ORDER — ASPIRIN 81 MG PO CHEW
324.0000 mg | CHEWABLE_TABLET | Freq: Once | ORAL | Status: AC
  Administered 2016-04-11: 324 mg via ORAL
  Filled 2016-04-11: qty 4

## 2016-04-11 MED ORDER — ONDANSETRON HCL 4 MG/2ML IJ SOLN
INTRAMUSCULAR | Status: AC
  Administered 2016-04-11: 4 mg
  Filled 2016-04-11: qty 2

## 2016-04-11 MED ORDER — SODIUM CHLORIDE 0.9% FLUSH
3.0000 mL | Freq: Two times a day (BID) | INTRAVENOUS | Status: DC
  Administered 2016-04-11 – 2016-04-16 (×10): 3 mL via INTRAVENOUS

## 2016-04-11 MED ORDER — CALCIUM ACETATE (PHOS BINDER) 667 MG PO TABS
667.0000 mg | ORAL_TABLET | Freq: Three times a day (TID) | ORAL | Status: DC
  Filled 2016-04-11 (×2): qty 1

## 2016-04-11 MED ORDER — ADULT MULTIVITAMIN W/MINERALS CH
1.0000 | ORAL_TABLET | Freq: Every day | ORAL | Status: DC
  Administered 2016-04-12 – 2016-04-14 (×3): 1 via ORAL
  Filled 2016-04-11 (×3): qty 1

## 2016-04-11 MED ORDER — LORAZEPAM 1 MG PO TABS
1.0000 mg | ORAL_TABLET | Freq: Four times a day (QID) | ORAL | Status: AC | PRN
  Administered 2016-04-12: 1 mg via ORAL
  Filled 2016-04-11 (×2): qty 1

## 2016-04-11 MED ORDER — MORPHINE SULFATE (PF) 2 MG/ML IV SOLN
2.0000 mg | INTRAVENOUS | Status: DC | PRN
  Administered 2016-04-12: 2 mg via INTRAVENOUS
  Filled 2016-04-11 (×2): qty 1

## 2016-04-11 NOTE — ED Triage Notes (Signed)
Developed left sided chest pain yesterday   Pain increases with breathing and moves to back  Also states fever

## 2016-04-11 NOTE — ED Notes (Signed)
Pt in via triage with complaints of chest pain radiating into back since last night, in addition to abdominal swelling and epigastric pain x 1 week.  Pt dialysis pt with last dialysis treatment yesterday.  Pt abdomen firm, BLE +1 edema, appears uncomfortable.  Pt does report hx of anxiety.  Pt A/Ox4, vitals WDL.  MD notified of pt condition and to bedside.

## 2016-04-11 NOTE — H&P (Signed)
[facility] NAME: Mario [name]    MR#:  [id number]  DATE OF BIRTH:  1970-08-21  DATE OF ADMISSION:  04/11/2016  PRIMARY CARE PHYSICIAN: No PCP Per Patient   REQUESTING/REFERRING PHYSICIAN: Lord   CHIEF COMPLAINT:   Chief Complaint  Patient presents with  . Chest Pain    HISTORY OF PRESENT ILLNESS: Mario [name]  is a 45 y.o. male with a known history of End-stage renal disease on hemodialysis, hypertension, renal insufficiency- for last 2-3 weeks started noticing increasing swelling on his legs and his abdomen in spite of going regular for his dialysis. His dialysis on Tuesday Thursday Saturday and the last episode was yesterday for 3-1/2 hours. Last night he started having tightness in his chest and the pain was going to his back which was constant stabbing-like and also going to his abdomen he described it on the level of 10 up to date. Injection morphine helped a little bit with his pain. He says he drinks 3 glasses of wine every day and he started shaking if he does not drink.  PAST MEDICAL HISTORY:   Past Medical History:  Diagnosis Date  . Dialysis patient ([place])   . Hypertension   . Renal disorder   . Renal insufficiency     PAST SURGICAL HISTORY: Past Surgical History:  Procedure Laterality Date  . AV FISTULA PLACEMENT      SOCIAL HISTORY:  Social History  Substance Use Topics  . Smoking status: Current Every Day Smoker    Packs/day: 0.25    Types: Cigarettes  . Smokeless tobacco: Never Used  . Alcohol use No    FAMILY HISTORY:  Family History  Problem Relation Age of Onset  . Kidney failure Father   . Diabetes Mother     DRUG ALLERGIES: No Known Allergies  REVIEW OF SYSTEMS:   CONSTITUTIONAL: No fever, fatigue or weakness.  EYES: No blurred or double vision.  EARS, NOSE, AND THROAT: No tinnitus or ear pain.  RESPIRATORY: No cough,Positive for shortness of breath, wheezing or hemoptysis.   CARDIOVASCULAR: Positive for chest pain, no orthopnea, edema.  GASTROINTESTINAL: No nausea, vomiting, diarrhea , some abdominal pain.  GENITOURINARY: No dysuria, hematuria.  ENDOCRINE: No polyuria, nocturia,  HEMATOLOGY: No anemia, easy bruising or bleeding SKIN: No rash or lesion. MUSCULOSKELETAL: No joint pain or arthritis.  Have swelling on the legs. NEUROLOGIC: No tingling, numbness, weakness.  PSYCHIATRY: No anxiety or depression.   MEDICATIONS AT HOME:  Prior to Admission medications   Medication Sig Start Date End Date Taking? Authorizing Provider  calcium acetate (PHOSLO) 667 MG tablet Take 667 mg by mouth 3 (three) times daily with meals.    Historical Provider, MD  clindamycin (CLEOCIN) 300 MG capsule Take 2 capsules (600 mg total) by mouth 3 (three) times daily. 01/28/15   Fritzi Mandes, MD  HYDROcodone-acetaminophen (NORCO/VICODIN) 5-325 MG per tablet Take 1-2 tablets by mouth every 4 (four) hours as needed for moderate pain. 01/28/15   Fritzi Mandes, MD  lanthanum (FOSRENOL) 1000 MG chewable tablet Chew 1,000 mg by mouth daily.    Historical Provider, MD  metoprolol tartrate (LOPRESSOR) 25 MG tablet Take 1 tablet (25 mg total) by mouth 2 (two) times daily. 01/28/15   Fritzi Mandes, MD  Multiple Vitamin (RENAL MULTIVITAMIN/ZINC PO) Take 1 tablet by mouth daily.    Historical Provider, MD      PHYSICAL EXAMINATION:   VITAL SIGNS: Blood pressure (!) 178/89, pulse  92, temperature 99.1 F (37.3 C), temperature source Oral, resp. rate (!) 26, height 5' 3.78" (1.62 m), weight 81.6 kg (180 lb), SpO2 94 %.  GENERAL:  45 y.o.-year-old patient lying in the bed with no acute distress.  EYES: Pupils equal, round, reactive to light and accommodation. No scleral icterus. Extraocular muscles intact.  HEENT: Head atraumatic, normocephalic. Oropharynx and nasopharynx clear.  NECK:  Supple, no jugular venous distention. No thyroid enlargement, no tenderness.  LUNGS: Normal breath sounds bilaterally,  no wheezing,Some crepitation. No use of accessory muscles of respiration.  CARDIOVASCULAR: S1, S2 normal. No murmurs, rubs, or gallops.  ABDOMEN: Soft, nontender, distended. Bowel sounds present. No organomegaly or mass.  EXTREMITIES: Bilateral pedal edema, no cyanosis, or clubbing.  NEUROLOGIC: Cranial nerves II through XII are intact. Muscle strength 5/5 in all extremities. Sensation intact. Gait not checked.  PSYCHIATRIC: The patient is alert and oriented x 3.  SKIN: No obvious rash, lesion, or ulcer.   LABORATORY PANEL:   CBC  Recent Labs Lab 04/11/16 1459  WBC 3.9  HGB 11.2*  HCT 33.1*  PLT 102*  MCV 102.7*  MCH 34.9*  MCHC 33.9  RDW 15.6*   ------------------------------------------------------------------------------------------------------------------  Chemistries   Recent Labs Lab 04/11/16 1459  NA 134*  K 4.7  CL 93*  CO2 23  GLUCOSE 97  BUN 39*  CREATININE 12.59*  CALCIUM 9.4  AST 54*  ALT 30  ALKPHOS 192*  BILITOT 1.6*   ------------------------------------------------------------------------------------------------------------------ estimated creatinine clearance is 7.1 mL/min (by C-G formula based on SCr of 12.59 mg/dL). ------------------------------------------------------------------------------------------------------------------ No results for input(s): TSH, T4TOTAL, T3FREE, THYROIDAB in the last 72 hours.  Invalid input(s): FREET3   Coagulation profile No results for input(s): INR, PROTIME in the last 168 hours. ------------------------------------------------------------------------------------------------------------------- No results for input(s): DDIMER in the last 72 hours. -------------------------------------------------------------------------------------------------------------------  Cardiac Enzymes  Recent Labs Lab 04/11/16 1459  TROPONINI 0.05*    ------------------------------------------------------------------------------------------------------------------ Invalid input(s): POCBNP  ---------------------------------------------------------------------------------------------------------------  Urinalysis No results found for: COLORURINE, APPEARANCEUR, LABSPEC, PHURINE, GLUCOSEU, HGBUR, BILIRUBINUR, KETONESUR, PROTEINUR, UROBILINOGEN, NITRITE, LEUKOCYTESUR   RADIOLOGY: Dg Chest 2 View  Result Date: 04/11/2016 CLINICAL DATA:  Left-sided chest pain beginning yesterday. EXAM: CHEST  2 VIEW COMPARISON:  01/04/2016 FINDINGS: Mild cardiomegaly. No confluent airspace opacities. Left pleural thickening/scarring as seen on prior CT. No effusions. No acute bony abnormality. Paraspinal soft tissue densities are again noted and stable, demonstrated to be paraesophageal varices on prior CT. IMPRESSION: Mild cardiomegaly.  No active disease. Electronically Signed   By: Rolm Baptise M.D.   On: 04/11/2016 15:58    EKG: Orders placed or performed during the hospital encounter of 04/11/16  . ED EKG within 10 minutes  . ED EKG within 10 minutes    IMPRESSION AND PLAN:  * Chest and abdominal pain with swelling on the legs   Fluid overload and end-stage renal disease and dialysis patient.    I call nephrology consult for further management.   As he of hypertension and he is a smoker, I would also like to rule out coronary artery disease.   Monitor on telemetry and follow serial troponin and get echocardiogram.   The cardiology consultation.  * Uncontrolled hypertension   Metoprolol orally.  * Chronic alcoholism   Watch for signs of alcohol withdrawal.   CIWA protocol.  * Active smoking    Counseled to quit smoking for 4 minutes and offered nicotine patch.   All the records are reviewed and case discussed with ED provider. Management plans discussed with  the patient, family and they are in agreement.  CODE STATUS:Full code  Code  Status History    Date Active Date Inactive Code Status Order ID Comments User Context   01/25/2015 12:29 PM 01/28/2015  4:26 PM Full Code [id number]  Dustin Flock, MD Inpatient      Patient was seen and explained him the plan with help of spanish translator.  TOTAL TIME TAKING CARE OF THIS PATIENT: 50  minutes.    Vaughan Basta M.D on 04/11/2016   Between 7am to 6pm - Pager - [phone]  After 6pm go to [email or web address] - password [email or web address] [facility]  Office  [phone]  CC: Primary care physician; No PCP Per Patient   Note: This dictation was prepared with Dragon dictation along with smaller phrase technology. Any transcriptional errors that result from this process are unintentional.

## 2016-04-11 NOTE — ED Provider Notes (Signed)
[facility] Emergency Department Provider Note ____________________________________________   I have reviewed the triage vital signs and the triage nursing note.  HISTORY  Chief Complaint Chest Pain   Historian Patient, through [place] interpreter remotely  HPI Hillman Attig is a 45 y.o. male with history of esrd/dialysis - last dialysis yesterday - here for abdominal swelling, epigastric into the central chest pain and radiation into the left back. Mild nausea without vomiting. Pain is moderate. It started this morning. Essentially constant. Nothing seems to make it worse or better. No fever or coughing. He has had some shortness of breath.    Past Medical History:  Diagnosis Date  . Dialysis patient ([place])   . Renal disorder   . Renal insufficiency     Patient Active Problem List   Diagnosis Date Noted  . Scrotal infection 01/25/2015  . ESRD (end stage renal disease) ([place]) 01/25/2015    Past Surgical History:  Procedure Laterality Date  . AV FISTULA PLACEMENT      Prior to Admission medications   Medication Sig Start Date End Date Taking? Authorizing Provider  calcium acetate (PHOSLO) 667 MG tablet Take 667 mg by mouth 3 (three) times daily with meals.    Historical Provider, MD  clindamycin (CLEOCIN) 300 MG capsule Take 2 capsules (600 mg total) by mouth 3 (three) times daily. 01/28/15   Fritzi Mandes, MD  HYDROcodone-acetaminophen (NORCO/VICODIN) 5-325 MG per tablet Take 1-2 tablets by mouth every 4 (four) hours as needed for moderate pain. 01/28/15   Fritzi Mandes, MD  lanthanum (FOSRENOL) 1000 MG chewable tablet Chew 1,000 mg by mouth daily.    Historical Provider, MD  metoprolol tartrate (LOPRESSOR) 25 MG tablet Take 1 tablet (25 mg total) by mouth 2 (two) times daily. 01/28/15   Fritzi Mandes, MD  Multiple Vitamin (RENAL MULTIVITAMIN/ZINC PO) Take 1 tablet by mouth daily.    Historical Provider, MD    No Known Allergies  Family History  Problem  Relation Age of Onset  . Kidney failure Father     Social History Social History  Substance Use Topics  . Smoking status: Current Every Day Smoker    Packs/day: 0.25    Types: Cigarettes  . Smokeless tobacco: Never Used  . Alcohol use No    Review of Systems  Constitutional: Negative for fever. Eyes: Negative for visual changes. ENT: Negative for sore throat. Cardiovascular: Positive for chest pain. Respiratory: Positive for shortness of breath. Gastrointestinal: Negative for vomiting and diarrhea. Genitourinary: Negative for dysuria. Musculoskeletal: Negative for back pain. Skin: Negative for rash. Neurological: Negative for headache. 10 point Review of Systems otherwise negative ____________________________________________   PHYSICAL EXAM:  VITAL SIGNS: ED Triage Vitals  Enc Vitals Group     BP 04/11/16 1451 (!) 172/87     Pulse Rate 04/11/16 1451 88     Resp 04/11/16 1451 (!) 24     Temp 04/11/16 1451 99.1 F (37.3 C)     Temp Source 04/11/16 1451 Oral     SpO2 04/11/16 1451 100 %     Weight 04/11/16 1452 180 lb (81.6 kg)     Height 04/11/16 1452 5' 3.78" (1.62 m)     Head Circumference --      Peak Flow --      Pain Score 04/11/16 1452 10     Pain Loc --      Pain Edu? --      Excl. in [place]? --      Constitutional:  Alert and oriented. Well appearing and in no distress. HEENT   Head: Normocephalic and atraumatic.      Eyes: Conjunctivae are normal. PERRL. Normal extraocular movements.      Ears:         Nose: No congestion/rhinnorhea.   Mouth/Throat: Mucous membranes are moist.   Neck: No stridor. Cardiovascular/Chest: Normal rate, regular rhythm.  No murmurs, rubs, or gallops. Respiratory: Normal respiratory effort without tachypnea nor retractions. Breath sounds are clear and equal bilaterally. No wheezes/rales/rhonchi. Gastrointestinal: Soft. No distention, no guarding, no rebound. Nontender.     Genitourinary/rectal:Deferred Musculoskeletal: Nontender with normal range of motion in all extremities. No joint effusions.  Moderate 2+ bilateral LE pitting edema Neurologic:  Normal speech and language. No gross or focal neurologic deficits are appreciated. Skin:  Skin is warm, dry and intact. No rash noted. Psychiatric: Mood and affect are normal. Speech and behavior are normal. Patient exhibits appropriate insight and judgment.   ____________________________________________  LABS (pertinent positives/negatives)  Labs Reviewed  CBC - Abnormal; Notable for the following:       Result Value   RBC 3.22 (*)    Hemoglobin 11.2 (*)    HCT 33.1 (*)    MCV 102.7 (*)    MCH 34.9 (*)    RDW 15.6 (*)    Platelets 102 (*)    All other components within normal limits  BASIC METABOLIC PANEL  TROPONIN I    ____________________________________________    EKG I, Lisa Roca, MD, the attending physician have personally viewed and interpreted all ECGs.  89 bpm. Narrow QRS. Normal axis. Normal sinus rhythm. Slightly peaked T waves. T-wave inverted in 3 and aVF ____________________________________________  RADIOLOGY All Xrays were viewed by me. Imaging interpreted by Radiologist.  Chest x-ray:2 views: Mild cardiomegaly. No active disease. __________________________________________  PROCEDURES  Procedure(s) performed: None  Critical Care performed: None  ____________________________________________   ED COURSE / ASSESSMENT AND PLAN  Pertinent labs & imaging results that were available during my care of the patient were reviewed by me and considered in my medical decision making (see chart for details).   O2 sat 100% on room air. Mr. Marlowe Sax is still having discomfort and points to his epigastrium and mid chest. Symptoms were ongoing all morning.  EKG looks reassuring other than possible hyperkalemia. Patient did state that he had dialysis yesterday. Symptoms somewhat  atypical for acute coronary syndrome without associated symptoms, however symptoms are also atypical for intra-abdominal cause as he points more to his mid chest and through to his back. Possible GERD or gastritis?  This patient looks volume overloaded clinically, but he is not hypoxic in fact he thought 100% on room air. He has edema to his legs up to his abdomen, and is questioning CHF which could possibly cause demand mismatch. His troponin came back minimally elevated 0.05. He is a dialysis patient, and so may be expected minimal elevation, it does look like previously his troponin had been 0.03.  He is feeling somewhat better after morphine.  Given symptoms, I discussed with him hospital observation and evaluation, and he is agreeable to this plan.    CONSULTATIONS:   Hospitalist for admission   Patient / Family / Caregiver informed of clinical course, medical decision-making process, and agree with plan.   ___________________________________________   FINAL CLINICAL IMPRESSION(S) / ED DIAGNOSES   Final diagnoses:  Nonspecific chest pain              Note: This dictation was prepared with  Sales executive. Any transcriptional errors that result from this process are unintentional    Lisa Roca, MD 04/11/16 [phone]

## 2016-04-11 NOTE — ED Notes (Signed)
Patient transported to X-ray 

## 2016-04-11 NOTE — Progress Notes (Signed)
Pt. admitted to unit, rm250 from ED, report from [facility], RN. Bed in low position. Fall safety plan reviewed, non-skid socks in place, bed alarm on. Skin assessed with Gildardo Pounds, RN. Telemetry box verified with CCMD and Alinda Deem, NT: [phone]. Will continue to monitor.   Interpreter at bedside, admission profile started. Patient reporting 8/10 chest pressure/discomfort and nausea. 2L O2 applied per protocol, 1 SL nitro given, orders from Dr. Anselm Jungling for zofran and morphine IV which were also both given. Patient reports some relief. Report to oncoming nurse at bedside with interpreter present.

## 2016-04-12 ENCOUNTER — Observation Stay
Admit: 2016-04-12 | Discharge: 2016-04-12 | Disposition: A | Discharge status: Home / Self Care | Payer: Medicaid Other | Attending: Internal Medicine | Admitting: Internal Medicine

## 2016-04-12 ENCOUNTER — Inpatient Hospital Stay: Payer: Medicaid Other

## 2016-04-12 DIAGNOSIS — I132 Hypertensive heart and chronic kidney disease with heart failure and with stage 5 chronic kidney disease, or end stage renal disease: Secondary | ICD-10-CM | POA: Diagnosis present

## 2016-04-12 DIAGNOSIS — E8770 Fluid overload, unspecified: Secondary | ICD-10-CM | POA: Diagnosis present

## 2016-04-12 DIAGNOSIS — D6959 Other secondary thrombocytopenia: Secondary | ICD-10-CM | POA: Diagnosis present

## 2016-04-12 DIAGNOSIS — T447X6A Underdosing of beta-adrenoreceptor antagonists, initial encounter: Secondary | ICD-10-CM | POA: Diagnosis present

## 2016-04-12 DIAGNOSIS — N186 End stage renal disease: Secondary | ICD-10-CM | POA: Diagnosis present

## 2016-04-12 DIAGNOSIS — K7011 Alcoholic hepatitis with ascites: Secondary | ICD-10-CM | POA: Diagnosis present

## 2016-04-12 DIAGNOSIS — Z833 Family history of diabetes mellitus: Secondary | ICD-10-CM | POA: Diagnosis not present

## 2016-04-12 DIAGNOSIS — F17210 Nicotine dependence, cigarettes, uncomplicated: Secondary | ICD-10-CM | POA: Diagnosis present

## 2016-04-12 DIAGNOSIS — F10239 Alcohol dependence with withdrawal, unspecified: Secondary | ICD-10-CM | POA: Diagnosis present

## 2016-04-12 DIAGNOSIS — I5023 Acute on chronic systolic (congestive) heart failure: Secondary | ICD-10-CM | POA: Diagnosis present

## 2016-04-12 DIAGNOSIS — Y929 Unspecified place or not applicable: Secondary | ICD-10-CM | POA: Diagnosis not present

## 2016-04-12 DIAGNOSIS — Z841 Family history of disorders of kidney and ureter: Secondary | ICD-10-CM | POA: Diagnosis not present

## 2016-04-12 DIAGNOSIS — D631 Anemia in chronic kidney disease: Secondary | ICD-10-CM | POA: Diagnosis present

## 2016-04-12 DIAGNOSIS — Z992 Dependence on renal dialysis: Secondary | ICD-10-CM | POA: Diagnosis not present

## 2016-04-12 DIAGNOSIS — K7031 Alcoholic cirrhosis of liver with ascites: Secondary | ICD-10-CM | POA: Diagnosis present

## 2016-04-12 DIAGNOSIS — R079 Chest pain, unspecified: Secondary | ICD-10-CM | POA: Diagnosis present

## 2016-04-12 DIAGNOSIS — Z9115 Patient's noncompliance with renal dialysis: Secondary | ICD-10-CM | POA: Diagnosis not present

## 2016-04-12 DIAGNOSIS — N2581 Secondary hyperparathyroidism of renal origin: Secondary | ICD-10-CM | POA: Diagnosis present

## 2016-04-12 HISTORY — DX: Fluid overload, unspecified: E87.70

## 2016-04-12 LAB — BASIC METABOLIC PANEL
ANION GAP: 17 — AB (ref 5–15)
BUN: 41 mg/dL — ABNORMAL HIGH (ref 6–20)
CALCIUM: 9.5 mg/dL (ref 8.9–10.3)
CHLORIDE: 93 mmol/L — AB (ref 101–111)
CO2: 24 mmol/L (ref 22–32)
Creatinine, Ser: 12.94 mg/dL — ABNORMAL HIGH (ref 0.61–1.24)
GFR calc non Af Amer: 4 mL/min — ABNORMAL LOW (ref >60)
GFR, EST AFRICAN AMERICAN: 5 mL/min — AB (ref >60)
Glucose, Bld: 100 mg/dL — ABNORMAL HIGH (ref 65–99)
Potassium: 4.7 mmol/L (ref 3.5–5.1)
SODIUM: 134 mmol/L — AB (ref 135–145)

## 2016-04-12 LAB — CBC
HCT: 30.4 % — ABNORMAL LOW (ref 40.0–52.0)
HEMOGLOBIN: 10.5 g/dL — AB (ref 13.0–18.0)
MCH: 35.3 pg — AB (ref 26.0–34.0)
MCHC: 34.5 g/dL (ref 32.0–36.0)
MCV: 102.5 fL — ABNORMAL HIGH (ref 80.0–100.0)
PLATELETS: 82 10*3/uL — AB (ref 150–440)
RBC: 2.97 MIL/uL — AB (ref 4.40–5.90)
RDW: 15.5 % — ABNORMAL HIGH (ref 11.5–14.5)
WBC: 4 10*3/uL (ref 3.8–10.6)

## 2016-04-12 LAB — HEMOGLOBIN A1C: Hgb A1c MFr Bld: 4.7 % (ref 4.0–6.0)

## 2016-04-12 LAB — PHOSPHORUS: PHOSPHORUS: 8 mg/dL — AB (ref 2.5–4.6)

## 2016-04-12 LAB — ECHOCARDIOGRAM COMPLETE
Height: 63.78 in
WEIGHTICAEL: 2880 [oz_av]

## 2016-04-12 LAB — TROPONIN I: Troponin I: 0.05 ng/mL (ref <0.03)

## 2016-04-12 MED ORDER — SODIUM CHLORIDE 0.9 % IV SOLN: 100.0000 mL | INTRAVENOUS | Status: DC | PRN

## 2016-04-12 MED ORDER — ALTEPLASE 2 MG IJ SOLR
2.0000 mg | Freq: Once | INTRAMUSCULAR | Status: DC | PRN
Start: 2016-04-12 — End: 2016-04-12

## 2016-04-12 MED ORDER — LIDOCAINE-PRILOCAINE 2.5-2.5 % EX CREA
1.0000 "application " | TOPICAL_CREAM | CUTANEOUS | Status: DC | PRN
  Filled 2016-04-12: qty 5

## 2016-04-12 MED ORDER — METOPROLOL TARTRATE 25 MG PO TABS
25.0000 mg | ORAL_TABLET | Freq: Two times a day (BID) | ORAL | Status: DC
Start: 2016-04-12 — End: 2016-04-12

## 2016-04-12 MED ORDER — HYDROCODONE-ACETAMINOPHEN 5-325 MG PO TABS
1.0000 | ORAL_TABLET | Freq: Four times a day (QID) | ORAL | Status: DC | PRN
  Administered 2016-04-12: 2 via ORAL
  Administered 2016-04-14 – 2016-04-16 (×3): 1 via ORAL
  Filled 2016-04-12 (×3): qty 1
  Filled 2016-04-12: qty 2

## 2016-04-12 MED ORDER — MORPHINE SULFATE (PF) 2 MG/ML IV SOLN
1.0000 mg | Freq: Four times a day (QID) | INTRAVENOUS | Status: DC | PRN
  Administered 2016-04-12 – 2016-04-15 (×4): 1 mg via INTRAVENOUS
  Filled 2016-04-12 (×3): qty 1

## 2016-04-12 MED ORDER — PENTAFLUOROPROP-TETRAFLUOROETH EX AERO
1.0000 "application " | INHALATION_SPRAY | CUTANEOUS | Status: DC | PRN
  Filled 2016-04-12: qty 30

## 2016-04-12 MED ORDER — CALCIUM ACETATE (PHOS BINDER) 667 MG PO CAPS
667.0000 mg | ORAL_CAPSULE | Freq: Three times a day (TID) | ORAL | Status: DC
  Administered 2016-04-12 – 2016-04-16 (×10): 667 mg via ORAL
  Filled 2016-04-12 (×9): qty 1

## 2016-04-12 MED ORDER — HEPARIN SODIUM (PORCINE) 1000 UNIT/ML DIALYSIS
1000.0000 [IU] | INTRAMUSCULAR | Status: DC | PRN
  Filled 2016-04-12: qty 1

## 2016-04-12 MED ORDER — LIDOCAINE HCL (PF) 1 % IJ SOLN
5.0000 mL | INTRAMUSCULAR | Status: DC | PRN
Start: 2016-04-12 — End: 2016-04-12
  Filled 2016-04-12: qty 5

## 2016-04-12 NOTE — Progress Notes (Signed)
Patient refused to give a password. Admission assessment completed with the help of an interpreter. Medicated with Vicodin and Morphine  As indicated in the [facility] for c/o chest pain. Patient is resting at this moment with his eyes closed, respirations even and unlabored. Will continue to monitor.

## 2016-04-12 NOTE — H&P (Signed)
Mario [name] is a 45 y.o. male  [id number]  Primary Cardiologist: Neoma Laming Reason for Consultation: Chest pain  HPI: 45 year old Hispanic male with a past medical history of hypertension renal failure on hemodialysis, 3 times a week presented to the hospital with chest pain. Patient states he has chest pain which is severe 10 out of 10 radiating to his back.   Review of Systems: Patient is also short of breath and has swelling of the legs and abdomen   Past Medical History:  Diagnosis Date  . Dialysis patient ([name])   . Hypertension   . Renal disorder   . Renal insufficiency     Medications Prior to Admission  Medication Sig Dispense Refill  . HYDROcodone-acetaminophen (NORCO/VICODIN) 5-325 MG per tablet Take 1-2 tablets by mouth every 4 (four) hours as needed for moderate pain. 30 tablet 0  . lanthanum (FOSRENOL) 1000 MG chewable tablet Chew 1,500 mg by mouth daily.     . sevelamer carbonate (RENVELA) 800 MG tablet Take 2,400 mg by mouth 3 (three) times daily with meals.     . metoprolol tartrate (LOPRESSOR) 25 MG tablet Take 1 tablet (25 mg total) by mouth 2 (two) times daily. (Patient not taking: Reported on 04/11/2016) 60 tablet 0     . calcium acetate  667 mg Oral TID WC  . folic acid  1 mg Oral Daily  . heparin  5,000 Units Subcutaneous Q8H  . lanthanum  1,000 mg Oral Q breakfast  . metoprolol tartrate  25 mg Oral BID  . multivitamin  1 tablet Oral Daily  . multivitamin with minerals  1 tablet Oral Daily  . sodium chloride flush  3 mL Intravenous Q12H  . thiamine  100 mg Oral Daily   Or  . thiamine  100 mg Intravenous Daily    Infusions:    No Known Allergies  Social History   Social History  . Marital status: Single    Spouse name: N/A  . Number of children: N/A  . Years of education: N/A   Occupational History  . Not on file.   Social History Main Topics  . Smoking status: Current Every Day Smoker    Packs/day: 0.25    Types: Cigarettes   . Smokeless tobacco: Never Used  . Alcohol use No  . Drug use: No  . Sexual activity: Not on file   Other Topics Concern  . Not on file   Social History Narrative  . No narrative on file    Family History  Problem Relation Age of Onset  . Kidney failure Father   . Diabetes Mother     PHYSICAL EXAM: Vitals:   04/12/16 0527 04/12/16 0935  BP: (!) 149/88   Pulse:  71  Resp:    Temp:       Intake/Output Summary (Last 24 hours) at 04/12/16 1104 Last data filed at 04/12/16 1044  Gross per 24 hour  Intake                0 ml  Output                0 ml  Net                0 ml    General:  Well appearing. No respiratory difficulty HEENT: normal Neck: supple. no JVD. Carotids 2+ bilat; no bruits. No lymphadenopathy or thryomegaly appreciated. Cor: PMI nondisplaced. Regular rate & rhythm. No rubs, gallops or murmurs.  Lungs: clear Abdomen: soft, nontender, nondistended. No hepatosplenomegaly. No bruits or masses. Good bowel sounds. Extremities: no cyanosis, clubbing, rash, edema Neuro: alert & oriented x 3, cranial nerves grossly intact. moves all 4 extremities w/o difficulty. Affect pleasant.  ECG: Normal sinus rhythm 89 bpm otherwise within normal limits  Results for orders placed or performed during the hospital encounter of 04/11/16 (from the past 24 hour(s))  Basic metabolic panel     Status: Abnormal   Collection Time: 04/11/16  2:59 PM  Result Value Ref Range   Sodium 134 (L) 135 - 145 mmol/L   Potassium 4.7 3.5 - 5.1 mmol/L   Chloride 93 (L) 101 - 111 mmol/L   CO2 23 22 - 32 mmol/L   Glucose, Bld 97 65 - 99 mg/dL   BUN 39 (H) 6 - 20 mg/dL   Creatinine, Ser 12.59 (H) 0.61 - 1.24 mg/dL   Calcium 9.4 8.9 - 10.3 mg/dL   GFR calc non Af Amer 4 (L) >60 mL/min   GFR calc Af Amer 5 (L) >60 mL/min   Anion gap 18 (H) 5 - 15  CBC     Status: Abnormal   Collection Time: 04/11/16  2:59 PM  Result Value Ref Range   WBC 3.9 3.8 - 10.6 K/uL   RBC 3.22 (L) 4.40 - 5.90  MIL/uL   Hemoglobin 11.2 (L) 13.0 - 18.0 g/dL   HCT 33.1 (L) 40.0 - 52.0 %   MCV 102.7 (H) 80.0 - 100.0 fL   MCH 34.9 (H) 26.0 - 34.0 pg   MCHC 33.9 32.0 - 36.0 g/dL   RDW 15.6 (H) 11.5 - 14.5 %   Platelets 102 (L) 150 - 440 K/uL  Troponin I     Status: Abnormal   Collection Time: 04/11/16  2:59 PM  Result Value Ref Range   Troponin I 0.05 (HH) <0.03 ng/mL  Hepatic function panel     Status: Abnormal   Collection Time: 04/11/16  2:59 PM  Result Value Ref Range   Total Protein 7.7 6.5 - 8.1 g/dL   Albumin 3.7 3.5 - 5.0 g/dL   AST 54 (H) 15 - 41 U/L   ALT 30 17 - 63 U/L   Alkaline Phosphatase 192 (H) 38 - 126 U/L   Total Bilirubin 1.6 (H) 0.3 - 1.2 mg/dL   Bilirubin, Direct 0.5 0.1 - 0.5 mg/dL   Indirect Bilirubin 1.1 (H) 0.3 - 0.9 mg/dL  Brain natriuretic peptide     Status: Abnormal   Collection Time: 04/11/16  2:59 PM  Result Value Ref Range   B Natriuretic Peptide 1,988.0 (H) 0.0 - 100.0 pg/mL  Lipase, blood     Status: None   Collection Time: 04/11/16  2:59 PM  Result Value Ref Range   Lipase 50 11 - 51 U/L  Hemoglobin A1c     Status: None   Collection Time: 04/11/16  5:30 PM  Result Value Ref Range   Hgb A1c MFr Bld 4.7 4.0 - 6.0 %  Lipid panel     Status: None   Collection Time: 04/11/16  5:30 PM  Result Value Ref Range   Cholesterol 152 0 - 200 mg/dL   Triglycerides 91 <150 mg/dL   HDL 71 >40 mg/dL   Total CHOL/HDL Ratio 2.1 RATIO   VLDL 18 0 - 40 mg/dL   LDL Cholesterol 63 0 - 99 mg/dL  Troponin I     Status: Abnormal   Collection Time: 04/11/16  7:15 PM  Result  Value Ref Range   Troponin I 0.06 (HH) <0.03 ng/mL  MRSA PCR Screening     Status: None   Collection Time: 04/11/16  9:00 PM  Result Value Ref Range   MRSA by PCR NEGATIVE NEGATIVE  Troponin I     Status: Abnormal   Collection Time: 04/11/16 10:42 PM  Result Value Ref Range   Troponin I 0.06 (HH) <0.03 ng/mL  Troponin I     Status: Abnormal   Collection Time: 04/12/16  3:34 AM  Result Value Ref  Range   Troponin I 0.05 (HH) <0.03 ng/mL  Basic metabolic panel     Status: Abnormal   Collection Time: 04/12/16  3:34 AM  Result Value Ref Range   Sodium 134 (L) 135 - 145 mmol/L   Potassium 4.7 3.5 - 5.1 mmol/L   Chloride 93 (L) 101 - 111 mmol/L   CO2 24 22 - 32 mmol/L   Glucose, Bld 100 (H) 65 - 99 mg/dL   BUN 41 (H) 6 - 20 mg/dL   Creatinine, Ser 12.94 (H) 0.61 - 1.24 mg/dL   Calcium 9.5 8.9 - 10.3 mg/dL   GFR calc non Af Amer 4 (L) >60 mL/min   GFR calc Af Amer 5 (L) >60 mL/min   Anion gap 17 (H) 5 - 15  CBC     Status: Abnormal   Collection Time: 04/12/16  3:34 AM  Result Value Ref Range   WBC 4.0 3.8 - 10.6 K/uL   RBC 2.97 (L) 4.40 - 5.90 MIL/uL   Hemoglobin 10.5 (L) 13.0 - 18.0 g/dL   HCT 30.4 (L) 40.0 - 52.0 %   MCV 102.5 (H) 80.0 - 100.0 fL   MCH 35.3 (H) 26.0 - 34.0 pg   MCHC 34.5 32.0 - 36.0 g/dL   RDW 15.5 (H) 11.5 - 14.5 %   Platelets 82 (L) 150 - 440 K/uL  Phosphorus     Status: Abnormal   Collection Time: 04/12/16  3:34 AM  Result Value Ref Range   Phosphorus 8.0 (H) 2.5 - 4.6 mg/dL   Dg Chest 2 View  Result Date: 04/11/2016 CLINICAL DATA:  Left-sided chest pain beginning yesterday. EXAM: CHEST  2 VIEW COMPARISON:  01/04/2016 FINDINGS: Mild cardiomegaly. No confluent airspace opacities. Left pleural thickening/scarring as seen on prior CT. No effusions. No acute bony abnormality. Paraspinal soft tissue densities are again noted and stable, demonstrated to be paraesophageal varices on prior CT. IMPRESSION: Mild cardiomegaly.  No active disease. Electronically Signed   By: Rolm Baptise M.D.   On: 04/11/2016 15:58     ASSESSMENT AND PLAN: Chest pain with multiple risk factors for coronary artery disease and mildly elevated troponin and normal EKG. Patient can be discharged with follow-up in the office on Monday at 1 PM. Will do outpatient Lexiscan Myoview.  KHAN,SHAUKAT A

## 2016-04-12 NOTE — Progress Notes (Signed)
Will perform CIWA assessment upon patient return to unit. Patient currently receiving dialysis. Mario [name]

## 2016-04-12 NOTE — Progress Notes (Signed)
Interpreter present to assist with consent form signing. Both dialysis consent forms signed and placed in patient's paper chart. Will call dialysis and notify patient ready for transport to department. Dialysis nurse already spoken to, brief report given. Wenda Low [facility]

## 2016-04-12 NOTE — Progress Notes (Signed)
[facility] [place] NAME: Mario [name]    MR#:  [id number]  DATE OF BIRTH:  07/05/1971  SUBJECTIVE:  Came in with increasing leg edema nd sob with chest tightness. Seen in HD Drinks ETOH several glasses of wine almost daily  REVIEW OF SYSTEMS:   Review of Systems  Constitutional: Negative for chills, fever and weight loss.  HENT: Negative for ear discharge, ear pain and nosebleeds.   Eyes: Negative for blurred vision, pain and discharge.  Respiratory: Positive for shortness of breath. Negative for sputum production, wheezing and stridor.   Cardiovascular: Positive for chest pain and leg swelling. Negative for palpitations, orthopnea and PND.  Gastrointestinal: Negative for abdominal pain, diarrhea, nausea and vomiting.  Genitourinary: Negative for frequency and urgency.  Musculoskeletal: Negative for back pain and joint pain.  Neurological: Positive for weakness. Negative for sensory change, speech change and focal weakness.  Psychiatric/Behavioral: Negative for depression and hallucinations. The patient is not nervous/anxious.    Tolerating Diet: Tolerating PT:   DRUG ALLERGIES:  No Known Allergies  VITALS:  Blood pressure (!) 160/83, pulse 79, temperature 97.6 F (36.4 C), temperature source Oral, resp. rate 16, height 5' 3.78" (1.62 m), weight 81 kg (178 lb 9.2 oz), SpO2 99 %.  PHYSICAL EXAMINATION:   Physical Exam  GENERAL:  45 y.o.-year-old patient lying in the bed with no acute distress. disheveled EYES: Pupils equal, round, reactive to light and accommodation. No scleral icterus. Extraocular muscles intact.  HEENT: Head atraumatic, normocephalic. Oropharynx and nasopharynx clear.  NECK:  Supple, no jugular venous distention. No thyroid enlargement, no tenderness.  LUNGS: decrease breath sounds bilaterally, no wheezing, rales, rhonchi. No use of accessory muscles of respiration.  CARDIOVASCULAR: S1, S2 normal.  No murmurs, rubs, or gallops.  ABDOMEN: Soft, nontender, ++distended. Bowel sounds present. No organomegaly or mass. ASCITES+ fluid thrill++ EXTREMITIES: No cyanosis, clubbing  -++ edema b/l.    NEUROLOGIC: Cranial nerves II through XII are intact. No focal Motor or sensory deficits b/l.   PSYCHIATRIC:  patient is alert and oriented x 3.  SKIN: No obvious rash, lesion, or ulcer.   LABORATORY PANEL:  CBC  Recent Labs Lab 04/12/16 0334  WBC 4.0  HGB 10.5*  HCT 30.4*  PLT 82*    Chemistries   Recent Labs Lab 04/11/16 1459 04/12/16 0334  NA 134* 134*  K 4.7 4.7  CL 93* 93*  CO2 23 24  GLUCOSE 97 100*  BUN 39* 41*  CREATININE 12.59* 12.94*  CALCIUM 9.4 9.5  AST 54*  --   ALT 30  --   ALKPHOS 192*  --   BILITOT 1.6*  --    Cardiac Enzymes  Recent Labs Lab 04/12/16 0334  TROPONINI 0.05*   RADIOLOGY:  Dg Chest 2 View  Result Date: 04/11/2016 CLINICAL DATA:  Left-sided chest pain beginning yesterday. EXAM: CHEST  2 VIEW COMPARISON:  01/04/2016 FINDINGS: Mild cardiomegaly. No confluent airspace opacities. Left pleural thickening/scarring as seen on prior CT. No effusions. No acute bony abnormality. Paraspinal soft tissue densities are again noted and stable, demonstrated to be paraesophageal varices on prior CT. IMPRESSION: Mild cardiomegaly.  No active disease. Electronically Signed   By: Rolm Baptise M.D.   On: 04/11/2016 15:58   ASSESSMENT AND PLAN:   Mario [name]  is a 45 y.o. male with a known history of End-stage renal disease on hemodialysis, hypertension, renal insufficiency- for last 2-3 weeks started noticing increasing swelling  on his legs and his abdomen in spite of going regular for his dialysis. His dialysis on Tuesday Thursday Saturday and the last episode was yesterday for 3-1/2 hours  *Acute  On Chronic systolic CHF, EF 60% -came in with  Chest and abdominal pain with swelling on the legs with weight gain -Fluid overload and end-stage renal disease  and dialysis patient. -Getting HD for UF-goal today 2 liters -cardiology input noted -EKG NSR  * Uncontrolled hypertension   Metoprolol orally. -seems pt is noncompliant with meds  * Chronic alcoholism with Ascites and elevated LFT's due to alcholic hepatis and possible cirrhosis -CT chest done in June 2017 reveals paraesophageal varices -[place] abd today -?paracentesis -Watch for signs of alcohol withdrawal.  -CIWA protocol. Pt advised to quit ETOH  * Thrombocytopenia chronic appears ETOH related  * Active smoking    Counseled to quit smoking for 4 minutes and offered nicotine patch  Case discussed with Care Management/Social Worker. Management plans discussed with the patient, family and they are in agreement.  CODE STATUS: FULL  DVT Prophylaxis: heparin TOTAL TIME TAKING CARE OF THIS PATIENT: 30 minutes.  >50% time spent on counselling and coordination of care  POSSIBLE D/C IN 2-3 DAYS, DEPENDING ON CLINICAL CONDITION.  Note: This dictation was prepared with Dragon dictation along with smaller phrase technology. Any transcriptional errors that result from this process are unintentional.  Mario [name] M.D on 04/12/2016 at 2:55 PM  Between 7am to 6pm - Pager - [phone]  After 6pm go to [email or web address] - password [email or web address] [place] [facility]  Office  [phone]  CC: Primary care physician; [name], MD

## 2016-04-12 NOTE — Progress Notes (Signed)
This note also relates to the following rows which could not be included: BP - Cannot attach notes to unvalidated device data  HD DISCONTINUED

## 2016-04-12 NOTE — Clinical Social Work Note (Signed)
Clinical Social Work Assessment  Patient Details  Name: Mario [name] MRN: [id number] Date of Birth: [birth year]  Date of referral:  04/12/16               Reason for consult:  Substance Use/ETOH Abuse                Permission sought to share information with:    Permission granted to share information::     Name::        Agency::     Relationship::     Contact Information:     Housing/Transportation Living arrangements for the past 2 months:  [place] of Information:  Patient Oncologist, Scientific laboratory technician) Patient Interpreter Needed:  Spanish Criminal Activity/Legal Involvement Pertinent to Current Situation/Hospitalization:  No - Comment as needed Significant Relationships:  Friend Lives with:  Friends Do you feel safe going back to the place where you live?  Yes Need for family participation in patient care:  No (Coment)  Care giving concerns:  Patient self-reports ETOH use   Social Worker assessment / plan:  CSW visited patient at bedside with [place]. Patient presented with a constricted affect, but he was able to answer assessment and SBIRT questions.   Patient has ESRD and is compliant with chronic HD. Patient presented at [facility] ED with chest pain. Patient will be dialyzed prior to dc. DC set for today.  Patient verbally assented to SBIRT. CSW performed screening and presented brief intervention and referral to services. Patient consented to have resources left with him. CSW provided resources in the community [employer] and [facility]) that offer Spanish-speaking treatment options. Patient thanked CSW for resources.  CSW signing off.  Employment status:  Unemployed Forensic scientist:  Self Pay (Medicaid Pending) PT Recommendations:  No Follow Up Information / Referral to community resources:  Outpatient Substance Abuse Treatment Options, Residential Substance Abuse Treatment Options  Patient/Family's Response to care:   Patient agreeable and showed gratitude to CSW.  Patient/Family's Understanding of and Emotional Response to Diagnosis, Current Treatment, and Prognosis:  Patient able to verbalize in his own terms the dangers of ETOH use with chronic kidney disease. Patient is in contemplation stage of change and is able to verbalize in his own terms that continued treatment will have a more positive outcome for his health.  Emotional Assessment Appearance:  Appears stated age Attitude/Demeanor/Rapport:  Other (Patient was cooperative but in obvious distress.) Affect (typically observed):  Appropriate, Restless, Other, Constricted (Patient presented as in pain but compliant.) Orientation:  Oriented to Self, Oriented to Place, Oriented to  Time, Oriented to Situation Alcohol / Substance use:  Alcohol Use Psych involvement (Current and /or in the community):  No (Comment)  Discharge Needs  Concerns to be addressed:  Substance Abuse Concerns Readmission within the last 30 days:  No Current discharge risk:  Substance Abuse, Chronically ill, Inadequate Financial Supports Barriers to Discharge:  No Barriers Identified   Zettie Pho, LCSW 04/12/2016, 10:30 AM

## 2016-04-12 NOTE — Progress Notes (Signed)
HD STARTED  

## 2016-04-12 NOTE — Care Management Note (Signed)
CSW received consult regarding ETOH assessment. CSW entered interpreter request for 2:00PM 04/12/2016 to complete assessment. CSW will con't to follow.  Santiago Bumpers, MSW, LCSW-A [phone]

## 2016-04-12 NOTE — Progress Notes (Signed)
PRE DIALYSIS ASSESSMENT 

## 2016-04-12 NOTE — Care Management Note (Signed)
Case Management Note  Patient Details  Name: Mario [name] MRN: [id number] Date of Birth: 21-Apr-1971  Subjective/Objective:     Interpreter and family at bedside with Mario [name]. Provided a MATCH Letter and instructions how to use via interpretor. Goes to [employer] per family.No home health orders at this time.                Action/Plan:   Expected Discharge Date:                  Expected Discharge Plan:     In-House Referral:     Discharge planning Services     Post Acute Care Choice:    Choice offered to:     DME Arranged:    DME Agency:     HH Arranged:    HH Agency:     Status of Service:     If discussed at [employer] of Stay Meetings, dates discussed:    Additional Comments:  Greogory Cornette A, RN 04/12/2016, 10:42 AM

## 2016-04-12 NOTE — Progress Notes (Signed)
[facility]  ROUNDING NOTE   Subjective:  Patient well known to [place] from a prior admission at which point in time he had a scrotal abscess. He presents now with chest pain. He has also had increasing lower extremity edema. He usually goes to dialysis on Tuesday, Thursday, Saturday.   Objective:  Vital signs in last 24 hours:  Temp:  [97.8 F (36.6 C)-99.1 F (37.3 C)] 97.8 F (36.6 C) (09/09 0436) Pulse Rate:  [71-92] 71 (09/09 0935) Resp:  [16-26] 16 (09/09 0436) BP: (149-190)/(80-98) 149/88 (09/09 0527) SpO2:  [94 %-100 %] 100 % (09/09 0935) Weight:  [81.6 kg (180 lb)] 81.6 kg (180 lb) (09/08 1452)  Weight change:  Filed Weights   04/11/16 1452  Weight: 81.6 kg (180 lb)    Intake/Output: No intake/output data recorded.   Intake/Output this shift:  No intake/output data recorded.  Physical Exam: General: No acute distress  Head: Normocephalic, atraumatic. Moist oral mucosal membranes  Eyes: Anicteric  Neck: Supple, trachea midline  Lungs:  Clear to auscultation, normal effort  Heart: S1S2 no rubs  Abdomen:  Soft, nontender, Bowel sounds present   Extremities: 2+ peripheral edema.  Neurologic: Nonfocal, moving all four extremities  Skin: No lesions  Access: LUE AVF, aneurysmal    Basic Metabolic Panel:  Recent Labs Lab 04/11/16 1459 04/12/16 0334  NA 134* 134*  K 4.7 4.7  CL 93* 93*  CO2 23 24  GLUCOSE 97 100*  BUN 39* 41*  CREATININE 12.59* 12.94*  CALCIUM 9.4 9.5  PHOS  --  8.0*    Liver Function Tests:  Recent Labs Lab 04/11/16 1459  AST 54*  ALT 30  ALKPHOS 192*  BILITOT 1.6*  PROT 7.7  ALBUMIN 3.7    Recent Labs Lab 04/11/16 1459  LIPASE 50   No results for input(s): AMMONIA in the last 168 hours.  CBC:  Recent Labs Lab 04/11/16 1459 04/12/16 0334  WBC 3.9 4.0  HGB 11.2* 10.5*  HCT 33.1* 30.4*  MCV 102.7* 102.5*  PLT 102* 82*    Cardiac Enzymes:  Recent Labs Lab 04/11/16 1459 04/11/16 1915  04/11/16 2242 04/12/16 0334  TROPONINI 0.05* 0.06* 0.06* 0.05*    BNP: Invalid input(s): POCBNP  CBG: No results for input(s): GLUCAP in the last 168 hours.  Microbiology: Results for orders placed or performed during the hospital encounter of 04/11/16  MRSA PCR Screening     Status: None   Collection Time: 04/11/16  9:00 PM  Result Value Ref Range Status   MRSA by PCR NEGATIVE NEGATIVE Final    Comment:        The GeneXpert MRSA Assay (FDA approved for NASAL specimens only), is one component of a comprehensive MRSA colonization surveillance program. It is not intended to diagnose MRSA infection nor to guide or monitor treatment for MRSA infections.     Coagulation Studies: No results for input(s): LABPROT, INR in the last 72 hours.  Urinalysis: No results for input(s): COLORURINE, LABSPEC, PHURINE, GLUCOSEU, HGBUR, BILIRUBINUR, KETONESUR, PROTEINUR, UROBILINOGEN, NITRITE, LEUKOCYTESUR in the last 72 hours.  Invalid input(s): APPERANCEUR    Imaging: Dg Chest 2 View  Result Date: 04/11/2016 CLINICAL DATA:  Left-sided chest pain beginning yesterday. EXAM: CHEST  2 VIEW COMPARISON:  01/04/2016 FINDINGS: Mild cardiomegaly. No confluent airspace opacities. Left pleural thickening/scarring as seen on prior CT. No effusions. No acute bony abnormality. Paraspinal soft tissue densities are again noted and stable, demonstrated to be paraesophageal varices on prior CT. IMPRESSION: Mild  cardiomegaly.  No active disease. Electronically Signed   By: Rolm Baptise M.D.   On: 04/11/2016 15:58     Medications:     . calcium acetate  667 mg Oral TID WC  . folic acid  1 mg Oral Daily  . heparin  5,000 Units Subcutaneous Q8H  . lanthanum  1,000 mg Oral Q breakfast  . metoprolol tartrate  25 mg Oral BID  . multivitamin  1 tablet Oral Daily  . multivitamin with minerals  1 tablet Oral Daily  . sodium chloride flush  3 mL Intravenous Q12H  . thiamine  100 mg Oral Daily   Or  .  thiamine  100 mg Intravenous Daily   sodium chloride, sodium chloride, alteplase, heparin, HYDROcodone-acetaminophen, lidocaine (PF), lidocaine-prilocaine, LORazepam **OR** LORazepam, morphine injection, nitroGLYCERIN, ondansetron (ZOFRAN) IV, pentafluoroprop-tetrafluoroeth  Assessment/ Plan:  45 y.o. male with a PMHX of end-stage renal disease on hemodialysis Tuesday, Thursday, Saturday, hypertension, anemia chronic kidney disease, secondary hyperparathyroidism, left upper extremity AV fistula, hx of scrotal abscess 01/25/15, presents 04/11/16 with chest pain.  1. ESRD on HD TTS. Patient is due for hemodialysis today. He has considerable edema on exam. We will likely consider dialysis tomorrow as well.  2. Anemia of chronic kidney disease. Hemoglobin currently 10.5. Hold off on Epogen for now.  3. Secondary hyperparathyroidism. Continue calcium acetate and Fosrenol. Check intact PTH and phosphorus with dialysis today.  4. Hypertension. Continue metoprolol 25 mg by mouth twice a day. We may need to consider adding additional antihypertensives if his blood pressure remains high.  5. Chest pain. Workup and evaluation per cardiology and hospitalist.   LOS: 0 Guiliana Shor 9/9/201711:35 AM

## 2016-04-13 LAB — PARATHYROID HORMONE, INTACT (NO CA): PTH: 1119 pg/mL — AB (ref 15–65)

## 2016-04-13 LAB — PHOSPHORUS: PHOSPHORUS: 5.7 mg/dL — AB (ref 2.5–4.6)

## 2016-04-13 MED ORDER — HYDRALAZINE HCL 20 MG/ML IJ SOLN: 10.0000 mg | Freq: Four times a day (QID) | INTRAMUSCULAR | Status: DC | PRN

## 2016-04-13 MED ORDER — HALOPERIDOL LACTATE 5 MG/ML IJ SOLN
2.0000 mg | Freq: Four times a day (QID) | INTRAMUSCULAR | Status: DC | PRN
  Administered 2016-04-13: 2 mg via INTRAVENOUS
  Filled 2016-04-13: qty 1

## 2016-04-13 MED ORDER — NEPRO/CARBSTEADY PO LIQD
237.0000 mL | Freq: Two times a day (BID) | ORAL | Status: DC
  Administered 2016-04-14 – 2016-04-16 (×2): 237 mL via ORAL

## 2016-04-13 NOTE — Progress Notes (Signed)
POST DIALYSIS ASSESSMENT 

## 2016-04-13 NOTE — Progress Notes (Signed)
CIWA scale was to be performed at about 15:45, however, patient still in dialysis at this time. Will perform assessment upon patient return to the unit. Wenda Low [facility]

## 2016-04-13 NOTE — Progress Notes (Signed)
HD STARTED  

## 2016-04-13 NOTE — Progress Notes (Signed)
Initial Nutrition Assessment  DOCUMENTATION CODES:   Not applicable  INTERVENTION:  Nepro Shake po BID, each supplement provides 425 kcal and 19 grams protein Monitor for PO intake D/C MVI with Minerals, pt is also receiving renal MVI   NUTRITION DIAGNOSIS:   Increased nutrient needs related to chronic illness (dialysis) as evidenced by estimated needs.  GOAL:   Patient will meet greater than or equal to 90% of their needs  MONITOR:   PO intake, Labs, I & O's, Weight trends, Supplement acceptance  REASON FOR ASSESSMENT:   Malnutrition Screening Tool    ASSESSMENT:   Mario [name]  is a 45 y.o. male with a known history of End-stage renal disease on hemodialysis, hypertension, renal insufficiency- for last 2-3 weeks started noticing increasing swelling on his legs and his abdomen in spite of going regular for his dialysis  Mario [name] is a spanish speaking HD patient who is confused this morning, does not know where he is. Per chart review his weight is down 4# over the past 3 months, insignificant for that time frame. No documented PO intake in the chart. He is receiving HD today. He also complains of chest pain. Per chart pt drinks 3 glasses of wine per night. Nutrition-Focused physical exam completed. Findings are no fat depletion, no muscle depletion, and moderate edema. Labs and medications reviewed. Phoslo, Folic Acid, Renal MVI, Thiamine B1,  Diet Order:  Diet renal with fluid restriction Fluid restriction: 1200 mL Fluid; Room service appropriate? Yes; Fluid consistency: Thin  Skin:  Reviewed, no issues  Last BM:  04/10/2016  Height:   Ht Readings from Last 1 Encounters:  04/11/16 5' 3.78" (1.62 m)    Weight:   Wt Readings from Last 1 Encounters:  04/12/16 176 lb 6.4 oz (80 kg)    Ideal Body Weight:  59.09 kg  BMI:  Body mass index is 30.49 kg/m.  Estimated Nutritional Needs:   Kcal:  1600-1900 calories  Protein:  80-96 gm  Fluid:  >/=  1.6L  EDUCATION NEEDS:   No education needs identified at this time  Mario [name]. Mario Furness, MS, RD LDN Inpatient Clinical Dietitian Pager [phone]

## 2016-04-13 NOTE — Progress Notes (Signed)
SUBJECTIVE: Patient is very confused this morning and was ready to sign out [place] but interpreter was brought in and he is willing to stay now.   Vitals:   04/12/16 1549 04/12/16 1918 04/13/16 0431 04/13/16 0937  BP: (!) 175/90 (!) 158/86 122/61 (!) 167/81  Pulse: 84 76 70 74  Resp: 20 14 (!) 24   Temp:  98.8 F (37.1 C) 98.5 F (36.9 C)   TempSrc:  Oral    SpO2: 100% 99% 90% 100%  Weight: 176 lb 6.4 oz (80 kg)     Height:        Intake/Output Summary (Last 24 hours) at 04/13/16 1137 Last data filed at 04/12/16 1826  Gross per 24 hour  Intake                0 ml  Output             1503 ml  Net            -1503 ml    LABS: Basic Metabolic Panel:  Recent Labs  04/11/16 1459 04/12/16 0334  NA 134* 134*  K 4.7 4.7  CL 93* 93*  CO2 23 24  GLUCOSE 97 100*  BUN 39* 41*  CREATININE 12.59* 12.94*  CALCIUM 9.4 9.5  PHOS  --  8.0*   Liver Function Tests:  Recent Labs  04/11/16 1459  AST 54*  ALT 30  ALKPHOS 192*  BILITOT 1.6*  PROT 7.7  ALBUMIN 3.7    Recent Labs  04/11/16 1459  LIPASE 50   CBC:  Recent Labs  04/11/16 1459 04/12/16 0334  WBC 3.9 4.0  HGB 11.2* 10.5*  HCT 33.1* 30.4*  MCV 102.7* 102.5*  PLT 102* 82*   Cardiac Enzymes:  Recent Labs  04/11/16 1915 04/11/16 2242 04/12/16 0334  TROPONINI 0.06* 0.06* 0.05*   BNP: Invalid input(s): POCBNP D-Dimer: No results for input(s): DDIMER in the last 72 hours. Hemoglobin A1C:  Recent Labs  04/11/16 1730  HGBA1C 4.7   Fasting Lipid Panel:  Recent Labs  04/11/16 1730  CHOL 152  HDL 71  LDLCALC 63  TRIG 91  CHOLHDL 2.1   Thyroid Function Tests: No results for input(s): TSH, T4TOTAL, T3FREE, THYROIDAB in the last 72 hours.  Invalid input(s): FREET3 Anemia Panel: No results for input(s): VITAMINB12, FOLATE, FERRITIN, TIBC, IRON, RETICCTPCT in the last 72 hours.   PHYSICAL EXAM General: Well developed, well nourished, in no acute distress HEENT:   Normocephalic and atramatic Neck:  No JVD.  Lungs: Clear bilaterally to auscultation and percussion. Heart: HRRR . Normal S1 and S2 without gallops or murmurs.  Abdomen: Bowel sounds are positive, abdomen soft and non-tender  Msk:  Back normal, normal gait. Normal strength and tone for age. Extremities: No clubbing, cyanosis or edema.   Neuro: Alert and oriented X 3. Psych:  Good affect, responds appropriately  TELEMETRY:Sinus rhythm  ASSESSMENT AND PLAN: No further chest pain/echocardiogram showed moderate LV dysfunction and patient is having altered mental status. He'll be going for dialysis today. There is question of alcoholic withdrawal versus drug withdrawal versus renal failure causing mental status changes. Renal doctor Dr. Holley Raring and primary doctors are addressing this matter.  Principal Problem:   Chest pain Active Problems:   Fluid overload    Neoma Laming A, MD, [facility] 04/13/2016 11:37 AM

## 2016-04-13 NOTE — Progress Notes (Signed)
[facility]  ROUNDING NOTE   Subjective:  Patient quite confused this a.m. With the aid of an interpreter it was determined that the patient is unaware of where he is at the moment. Yesterday we have plan for an additional dialysis treatment as he had significant edema. He may be experiencing delirium tremens.   Objective:  Vital signs in last 24 hours:  Temp:  [97.6 F (36.4 C)-98.8 F (37.1 C)] 98.5 F (36.9 C) (09/10 0431) Pulse Rate:  [70-84] 74 (09/10 0937) Resp:  [14-24] 24 (09/10 0431) BP: (122-177)/(61-117) 167/81 (09/10 0937) SpO2:  [90 %-100 %] 100 % (09/10 0937) Weight:  [80 kg (176 lb 6.4 oz)] 80 kg (176 lb 6.4 oz) (09/09 1549)  Weight change: -0.648 kg (-1 lb 6.8 oz) Filed Weights   04/11/16 1452 04/12/16 1142 04/12/16 1549  Weight: 81.6 kg (180 lb) 81 kg (178 lb 9.2 oz) 80 kg (176 lb 6.4 oz)    Intake/Output: I/O last 3 completed shifts: In: 0  Out: 1503 [[id number]]   Intake/Output this shift:  No intake/output data recorded.  Physical Exam: General: No acute distress  Head: Normocephalic, atraumatic. Moist oral mucosal membranes  Eyes: Anicteric  Neck: Supple, trachea midline  Lungs:  Clear to auscultation, normal effort  Heart: S1S2 no rubs  Abdomen:  Soft, nontender, Bowel sounds present   Extremities: 1+ peripheral edema.  Neurologic: Confused, will follow simple commands.   Skin: No lesions  Access: LUE AVF, aneurysmal    Basic Metabolic Panel:  Recent Labs Lab 04/11/16 1459 04/12/16 0334  NA 134* 134*  K 4.7 4.7  CL 93* 93*  CO2 23 24  GLUCOSE 97 100*  BUN 39* 41*  CREATININE 12.59* 12.94*  CALCIUM 9.4 9.5  PHOS  --  8.0*    Liver Function Tests:  Recent Labs Lab 04/11/16 1459  AST 54*  ALT 30  ALKPHOS 192*  BILITOT 1.6*  PROT 7.7  ALBUMIN 3.7    Recent Labs Lab 04/11/16 1459  LIPASE 50   No results for input(s): AMMONIA in the last 168 hours.  CBC:  Recent Labs Lab 04/11/16 1459  04/12/16 0334  WBC 3.9 4.0  HGB 11.2* 10.5*  HCT 33.1* 30.4*  MCV 102.7* 102.5*  PLT 102* 82*    Cardiac Enzymes:  Recent Labs Lab 04/11/16 1459 04/11/16 1915 04/11/16 2242 04/12/16 0334  TROPONINI 0.05* 0.06* 0.06* 0.05*    BNP: Invalid input(s): POCBNP  CBG: No results for input(s): GLUCAP in the last 168 hours.  Microbiology: Results for orders placed or performed during the hospital encounter of 04/11/16  MRSA PCR Screening     Status: None   Collection Time: 04/11/16  9:00 PM  Result Value Ref Range Status   MRSA by PCR NEGATIVE NEGATIVE Final    Comment:        The GeneXpert MRSA Assay (FDA approved for NASAL specimens only), is one component of a comprehensive MRSA colonization surveillance program. It is not intended to diagnose MRSA infection nor to guide or monitor treatment for MRSA infections.     Coagulation Studies: No results for input(s): LABPROT, INR in the last 72 hours.  Urinalysis: No results for input(s): COLORURINE, LABSPEC, PHURINE, GLUCOSEU, HGBUR, BILIRUBINUR, KETONESUR, PROTEINUR, UROBILINOGEN, NITRITE, LEUKOCYTESUR in the last 72 hours.  Invalid input(s): APPERANCEUR    Imaging: Dg Chest 2 View  Result Date: 04/11/2016 CLINICAL DATA:  Left-sided chest pain beginning yesterday. EXAM: CHEST  2 VIEW COMPARISON:  01/04/2016 FINDINGS: Mild cardiomegaly.  No confluent airspace opacities. Left pleural thickening/scarring as seen on prior CT. No effusions. No acute bony abnormality. Paraspinal soft tissue densities are again noted and stable, demonstrated to be paraesophageal varices on prior CT. IMPRESSION: Mild cardiomegaly.  No active disease. Electronically Signed   By: Rolm Baptise M.D.   On: 04/11/2016 15:58   US Abdomen Limited  Result Date: 04/12/2016 CLINICAL DATA:  Ascites. EXAM: LIMITED ABDOMEN ULTRASOUND FOR ASCITES TECHNIQUE: Limited ultrasound survey for ascites was performed in all four abdominal quadrants. COMPARISON:  None.  FINDINGS: Moderate amount of ascites is noted in all 4 quadrants of the abdomen. IMPRESSION: Moderate ascites is noted. Electronically Signed   By: Marijo Conception, M.D.   On: 04/12/2016 15:27     Medications:     . calcium acetate  667 mg Oral TID WC  . folic acid  1 mg Oral Daily  . heparin  5,000 Units Subcutaneous Q8H  . lanthanum  1,000 mg Oral Q breakfast  . metoprolol tartrate  25 mg Oral BID  . multivitamin  1 tablet Oral Daily  . multivitamin with minerals  1 tablet Oral Daily  . sodium chloride flush  3 mL Intravenous Q12H  . thiamine  100 mg Oral Daily   Or  . thiamine  100 mg Intravenous Daily   haloperidol lactate, HYDROcodone-acetaminophen, LORazepam **OR** LORazepam, morphine injection, nitroGLYCERIN, ondansetron (ZOFRAN) IV  Assessment/ Plan:  45 y.o. male with a PMHX of end-stage renal disease on hemodialysis Tuesday, Thursday, Saturday, hypertension, anemia chronic kidney disease, secondary hyperparathyroidism, left upper extremity AV fistula, hx of scrotal abscess 01/25/15, presents 04/11/16 with chest pain.  1. ESRD on HD TTS. We have plan for an additional dialysis treatment today. Orders have been prepared.  2. Anemia of chronic kidney disease. Hemoglobin 10.5 yesterday. Hold off on Epogen for now.  3. Secondary hyperparathyroidism. Phosphorus quite high yesterday at 8.0.  Unclear if he has good binder adherence. Continue Fosrenol as well as calcium acetate at the current doses for now. Continue to monitor phosphorus.  4. Hypertension. Blood pressure labile at the moment and may be related to agitation. Blood pressure currently 167/81. Continue metoprolol for now.  5. Chest pain. Workup and evaluation per cardiology and hospitalist.   LOS: 1 Mario [name] 9/10/201711:46 AM

## 2016-04-13 NOTE — Progress Notes (Signed)
Patient resting calmly in room at this time. Will obviously not wake patient up to complete CIWA scale. Will reassess later if necessary. Wenda Low [facility]

## 2016-04-13 NOTE — Progress Notes (Signed)
HD COMPLETED  

## 2016-04-13 NOTE — Progress Notes (Signed)
[facility] [place] NAME: Mario [name]    MR#:  [id number]  DATE OF BIRTH:  08-30-1970  SUBJECTIVE:   Patient here due to shortness of breath and lower extremity edema noted to be volume overloaded. Patient is confused and walking around the unit.  Patient seemed to help of a Spanish interpreter. Seems confused and going through alcohol withdrawal  REVIEW OF SYSTEMS:   Review of Systems  Unable to perform ROS: Mental acuity   Tolerating Diet: yes  Tolerating PT: Ambulatory  DRUG ALLERGIES:  No Known Allergies  VITALS:  Blood pressure (!) 172/91, pulse 66, temperature 97.3 F (36.3 C), temperature source Axillary, resp. rate 18, height 5' 3.78" (1.62 m), weight 80.2 kg (176 lb 12.9 oz), SpO2 96 %.  PHYSICAL EXAMINATION:   Physical Exam  GENERAL:  45 y.o.-year-old patient lying ambulating around the unit in NAD. Confused. EYES: Pupils equal, round, reactive to light and accommodation. No scleral icterus. Extraocular muscles intact.  HEENT: Head atraumatic, normocephalic. Oropharynx and nasopharynx clear.  NECK:  Supple, no jugular venous distention. No thyroid enlargement, no tenderness.  LUNGS: Good a/e b/l, no wheezing, rales, rhonchi. No use of accessory muscles of respiration.  CARDIOVASCULAR: S1, S2 normal. No murmurs, rubs, or gallops.  ABDOMEN: Soft, nontender, ++distended. Bowel sounds present. No organomegaly or mass. Ascites and + fluid wave. EXTREMITIES: No cyanosis, clubbing, + 2 edema b/l.  NEUROLOGIC: Cranial nerves II through XII are intact. No focal Motor or sensory deficits b/l.   PSYCHIATRIC:  patient is alert and oriented x 3.  SKIN: No obvious rash, lesion, or ulcer.   LABORATORY PANEL:  CBC  Recent Labs Lab 04/12/16 0334  WBC 4.0  HGB 10.5*  HCT 30.4*  PLT 82*    Chemistries   Recent Labs Lab 04/11/16 1459 04/12/16 0334  NA 134* 134*  K 4.7 4.7  CL 93* 93*  CO2 23 24  GLUCOSE  97 100*  BUN 39* 41*  CREATININE 12.59* 12.94*  CALCIUM 9.4 9.5  AST 54*  --   ALT 30  --   ALKPHOS 192*  --   BILITOT 1.6*  --    Cardiac Enzymes  Recent Labs Lab 04/12/16 0334  TROPONINI 0.05*   RADIOLOGY:  Dg Chest 2 View  Result Date: 04/11/2016 CLINICAL DATA:  Left-sided chest pain beginning yesterday. EXAM: CHEST  2 VIEW COMPARISON:  01/04/2016 FINDINGS: Mild cardiomegaly. No confluent airspace opacities. Left pleural thickening/scarring as seen on prior CT. No effusions. No acute bony abnormality. Paraspinal soft tissue densities are again noted and stable, demonstrated to be paraesophageal varices on prior CT. IMPRESSION: Mild cardiomegaly.  No active disease. Electronically Signed   By: Rolm Baptise M.D.   On: 04/11/2016 15:58   US Abdomen Limited  Result Date: 04/12/2016 CLINICAL DATA:  Ascites. EXAM: LIMITED ABDOMEN ULTRASOUND FOR ASCITES TECHNIQUE: Limited ultrasound survey for ascites was performed in all four abdominal quadrants. COMPARISON:  None. FINDINGS: Moderate amount of ascites is noted in all 4 quadrants of the abdomen. IMPRESSION: Moderate ascites is noted. Electronically Signed   By: Marijo Conception, M.D.   On: 04/12/2016 15:27   ASSESSMENT AND PLAN:   Mario [name]  is a 45 y.o. male with a known history of End-stage renal disease on hemodialysis, hypertension, renal insufficiency- for last 2-3 weeks started noticing increasing swelling on his legs and his abdomen in spite of going regular for his dialysis. His dialysis on  Tuesday Thursday Saturday and the last episode was yesterday for 3-1/2 hours  *Acute  On Chronic systolic CHF, EF 73% - s/p dialysis yesterday and will get HD today - appreciate Nephro input.  - clinically improved and will monitor.    * Uncontrolled hypertension - cont. Metoprolol - will add some PRN hydralazine. -seems pt is noncompliant with meds  * Chronic alcoholism with Ascites and elevated LFT's due to alcholic hepatis and  possible cirrhosis -CT chest done in June 2017 reveals paraesophageal varices -US showing moderate ascites.    * AMS/confusion - Due to alcohol withdrawal and also possible underlying Wernicke's Korsakoff. -Continue CIWA protocol. Landscape architect.  * Thrombocytopenia chronic - stable and will follow counts.  - due to ETOH liver disease.   * Active smoking  - cont. Nicotine patch.   Case discussed with Care Management/Social Worker. Management plans discussed with the patient, family and they are in agreement.  CODE STATUS: FULL  DVT Prophylaxis: heparin SQ  TOTAL TIME TAKING CARE OF THIS PATIENT: 36 minutes.   >50% time spent on counselling and coordination of care  POSSIBLE D/C IN 2-3 DAYS, DEPENDING ON CLINICAL CONDITION.  Note: This dictation was prepared with Dragon dictation along with smaller phrase technology. Any transcriptional errors that result from this process are unintentional.  Henreitta Leber M.D on 04/13/2016 at 1:44 PM  Between 7am to 6pm - Pager - [phone]  After 6pm go to [email or web address] - password [email or web address] [facility]  Office  [phone]  CC: Primary care physician; [name], MD

## 2016-04-13 NOTE — Progress Notes (Signed)
PRE DIALYSIS ASSESSMENT 

## 2016-04-14 MED ORDER — POLYETHYLENE GLYCOL 3350 17 G PO PACK
17.0000 g | PACK | Freq: Every day | ORAL | Status: DC
  Administered 2016-04-14 – 2016-04-16 (×3): 17 g via ORAL
  Filled 2016-04-14 (×3): qty 1

## 2016-04-14 MED ORDER — SENNA 8.6 MG PO TABS
1.0000 | ORAL_TABLET | Freq: Two times a day (BID) | ORAL | Status: DC
  Administered 2016-04-14 – 2016-04-16 (×4): 8.6 mg via ORAL
  Filled 2016-04-14 (×4): qty 1

## 2016-04-14 NOTE — Progress Notes (Signed)
SUBJECTIVE: Patient still has occasional chest pain   Vitals:   04/13/16 1530 04/13/16 1600 04/13/16 1630 04/13/16 1943  BP: (!) 173/83  (!) 152/102 (!) 153/71  Pulse:    84  Resp: (!) 25 (!) 24 19 18   Temp:   97.9 F (36.6 C) 98.2 F (36.8 C)  TempSrc:   Oral Oral  SpO2:    97%  Weight:   173 lb 4.5 oz (78.6 kg)   Height:        Intake/Output Summary (Last 24 hours) at 04/14/16 0843 Last data filed at 04/13/16 1843  Gross per 24 hour  Intake              120 ml  Output             2000 ml  Net            -1880 ml    LABS: Basic Metabolic Panel:  Recent Labs  04/11/16 1459 04/12/16 0334 04/13/16 1305  NA 134* 134*  --   K 4.7 4.7  --   CL 93* 93*  --   CO2 23 24  --   GLUCOSE 97 100*  --   BUN 39* 41*  --   CREATININE 12.59* 12.94*  --   CALCIUM 9.4 9.5  --   PHOS  --  8.0* 5.7*   Liver Function Tests:  Recent Labs  04/11/16 1459  AST 54*  ALT 30  ALKPHOS 192*  BILITOT 1.6*  PROT 7.7  ALBUMIN 3.7    Recent Labs  04/11/16 1459  LIPASE 50   CBC:  Recent Labs  04/11/16 1459 04/12/16 0334  WBC 3.9 4.0  HGB 11.2* 10.5*  HCT 33.1* 30.4*  MCV 102.7* 102.5*  PLT 102* 82*   Cardiac Enzymes:  Recent Labs  04/11/16 1915 04/11/16 2242 04/12/16 0334  TROPONINI 0.06* 0.06* 0.05*   BNP: Invalid input(s): POCBNP D-Dimer: No results for input(s): DDIMER in the last 72 hours. Hemoglobin A1C:  Recent Labs  04/11/16 1730  HGBA1C 4.7   Fasting Lipid Panel:  Recent Labs  04/11/16 1730  CHOL 152  HDL 71  LDLCALC 63  TRIG 91  CHOLHDL 2.1   Thyroid Function Tests: No results for input(s): TSH, T4TOTAL, T3FREE, THYROIDAB in the last 72 hours.  Invalid input(s): FREET3 Anemia Panel: No results for input(s): VITAMINB12, FOLATE, FERRITIN, TIBC, IRON, RETICCTPCT in the last 72 hours.   PHYSICAL EXAM General: Well developed, well nourished, in no acute distress HEENT:  Normocephalic and atramatic Neck:  No JVD.  Lungs: Clear  bilaterally to auscultation and percussion. Heart: HRRR . Normal S1 and S2 without gallops or murmurs.  Abdomen: Bowel sounds are positive, abdomen soft and non-tender  Msk:  Back normal, normal gait. Normal strength and tone for age. Extremities: No clubbing, cyanosis or edema.   Neuro: Alert and oriented X 3. Psych:  Good affect, responds appropriately  TELEMETRY:Sinus rhythm  ASSESSMENT AND PLAN: Artery mental status yesterday with being more alert today as complaining of chest pain. Chest pain appears to be atypical pleuritic and MI has been ruled out. He just has very mild elevation of troponin which is probably due to renal failure and EKG was normal. Will do outpatient Lexiscan Myoview.  Principal Problem:   Chest pain Active Problems:   Fluid overload    Neoma Laming A, MD, [facility] 04/14/2016 8:43 AM

## 2016-04-14 NOTE — Care Management (Signed)
Patient receives chronic HD at [facility] T Th Sat.  Spoke with Dominica Severin at the clinic and patient is compliant with treatment "50% of the time" and he usually does not stay for his entire treatment.  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  It is reported he is undocumented.  He also has chronic alcoholism with ascites. Dominica Severin will have the clinic's social worker call this CM 9.12.2017.  There is concern that patient is withdrawing for alcohol.   Within the last 24 hours, has scored as high as 10 on the ciwa scale.  CM will speak with patient directly 9.12 after speaking with the dialysis cinical social worker

## 2016-04-14 NOTE — Progress Notes (Signed)
Used language line for morning assessment and med pass (#[id number]). No complaints of pain. Will continue to monitor. Sitter at bedside.

## 2016-04-14 NOTE — Progress Notes (Signed)
Patient resting quietly with sitter and visitors at bedside. No complaints of chest pain. Complaining of back pain - requested PRN norco. Will continue to monitor.

## 2016-04-14 NOTE — Progress Notes (Signed)
Pt observed expelling bloody mucus from nasal passages, kleenex and moisturizer applied to nostrils. Will continue to monitor.

## 2016-04-14 NOTE — Progress Notes (Signed)
Pt requesting medication to help him have a bowel movement. Dr. Verdell Carmine paged - orders for miralax daily and senokot BID.

## 2016-04-14 NOTE — Progress Notes (Signed)
[facility]  ROUNDING NOTE   Subjective:   History taken with interpreter. Sitter at bedside. Continues to be confused. Admitted to drinking tequila daily.   Last dialysis yesterday.  Reports no more chest pain  Objective:  Vital signs in last 24 hours:  Temp:  [97.3 F (36.3 C)-98.2 F (36.8 C)] 98.1 F (36.7 C) (09/11 1108) Pulse Rate:  [66-84] 72 (09/11 1108) Resp:  [17-29] 20 (09/11 1108) BP: (113-173)/(61-102) 141/79 (09/11 1108) SpO2:  [94 %-97 %] 96 % (09/11 1108) Weight:  [78.6 kg (173 lb 4.5 oz)-80.2 kg (176 lb 12.9 oz)] 78.6 kg (173 lb 4.5 oz) (09/10 1630)  Weight change: -0.8 kg (-1 lb 12.2 oz) Filed Weights   04/12/16 1549 04/13/16 1300 04/13/16 1630  Weight: 80 kg (176 lb 6.4 oz) 80.2 kg (176 lb 12.9 oz) 78.6 kg (173 lb 4.5 oz)    Intake/Output: I/O last 3 completed shifts: In: 120 [P.O.:120] Out: 2000 [Other:2000]   Intake/Output this shift:  Total I/O In: 240 [P.O.:240] Out: -   Physical Exam: General: No acute distress  Head: Normocephalic, atraumatic. Moist oral mucosal membranes  Eyes: Anicteric  Neck: Supple, trachea midline  Lungs:  Clear to auscultation, normal effort  Heart: S1S2 no rubs  Abdomen:  Soft, nontender, Bowel sounds present   Extremities: trace peripheral edema.  Neurologic: alert  Skin: No lesions  Access: LUE AVF, aneurysmal    Basic Metabolic Panel:  Recent Labs Lab 04/11/16 1459 04/12/16 0334 04/13/16 1305  NA 134* 134*  --   K 4.7 4.7  --   CL 93* 93*  --   CO2 23 24  --   GLUCOSE 97 100*  --   BUN 39* 41*  --   CREATININE 12.59* 12.94*  --   CALCIUM 9.4 9.5  --   PHOS  --  8.0* 5.7*    Liver Function Tests:  Recent Labs Lab 04/11/16 1459  AST 54*  ALT 30  ALKPHOS 192*  BILITOT 1.6*  PROT 7.7  ALBUMIN 3.7    Recent Labs Lab 04/11/16 1459  LIPASE 50   No results for input(s): AMMONIA in the last 168 hours.  CBC:  Recent Labs Lab 04/11/16 1459 04/12/16 0334  WBC 3.9 4.0   HGB 11.2* 10.5*  HCT 33.1* 30.4*  MCV 102.7* 102.5*  PLT 102* 82*    Cardiac Enzymes:  Recent Labs Lab 04/11/16 1459 04/11/16 1915 04/11/16 2242 04/12/16 0334  TROPONINI 0.05* 0.06* 0.06* 0.05*    BNP: Invalid input(s): POCBNP  CBG: No results for input(s): GLUCAP in the last 168 hours.  Microbiology: Results for orders placed or performed during the hospital encounter of 04/11/16  MRSA PCR Screening     Status: None   Collection Time: 04/11/16  9:00 PM  Result Value Ref Range Status   MRSA by PCR NEGATIVE NEGATIVE Final    Comment:        The GeneXpert MRSA Assay (FDA approved for NASAL specimens only), is one component of a comprehensive MRSA colonization surveillance program. It is not intended to diagnose MRSA infection nor to guide or monitor treatment for MRSA infections.     Coagulation Studies: No results for input(s): LABPROT, INR in the last 72 hours.  Urinalysis: No results for input(s): COLORURINE, LABSPEC, PHURINE, GLUCOSEU, HGBUR, BILIRUBINUR, KETONESUR, PROTEINUR, UROBILINOGEN, NITRITE, LEUKOCYTESUR in the last 72 hours.  Invalid input(s): APPERANCEUR    Imaging: US Abdomen Limited  Result Date: 04/12/2016 CLINICAL DATA:  Ascites. EXAM: LIMITED  ABDOMEN ULTRASOUND FOR ASCITES TECHNIQUE: Limited ultrasound survey for ascites was performed in all four abdominal quadrants. COMPARISON:  None. FINDINGS: Moderate amount of ascites is noted in all 4 quadrants of the abdomen. IMPRESSION: Moderate ascites is noted. Electronically Signed   By: Marijo Conception, M.D.   On: 04/12/2016 15:27     Medications:     . calcium acetate  667 mg Oral TID WC  . feeding supplement (NEPRO CARB STEADY)  237 mL Oral BID BM  . folic acid  1 mg Oral Daily  . heparin  5,000 Units Subcutaneous Q8H  . lanthanum  1,000 mg Oral Q breakfast  . metoprolol tartrate  25 mg Oral BID  . multivitamin  1 tablet Oral Daily  . multivitamin with minerals  1 tablet Oral Daily  .  sodium chloride flush  3 mL Intravenous Q12H  . thiamine  100 mg Oral Daily   Or  . thiamine  100 mg Intravenous Daily   haloperidol lactate, hydrALAZINE, HYDROcodone-acetaminophen, LORazepam **OR** LORazepam, morphine injection, nitroGLYCERIN, ondansetron (ZOFRAN) IV  Assessment/ Plan:  45 y.o. Hispanic male with a PMHX of end-stage renal disease on hemodialysis Tuesday, Thursday, Saturday, hypertension, anemia chronic kidney disease, secondary hyperparathyroidism, left upper extremity AV fistula,   TTS [facility] [place].   1. ESRD on HD TTS. Extra treatment yesterday. Next treatment for Tuesday. No acute indication for dialysis.   2. Anemia of chronic kidney disease. Hemoglobin 10.5. Macrocytic. Hold epo for now.   3. Secondary hyperparathyroidism. With hyperphosphatemia - Lanthanum and calcium acetate  4. Hypertension: elevated due to agitation and alcohol withdrawal.    LOS: [place], [place] 9/11/201711:27 AM

## 2016-04-14 NOTE — Progress Notes (Signed)
[facility] [place] NAME: Mario [name]    MR#:  [id number]  DATE OF BIRTH:  01/06/1971  SUBJECTIVE:   Patient here due to shortness of breath and lower extremity edema noted to be volume overloaded. More alert and oriented today.  Complaining of vague chest pain and also shortness of breath.   REVIEW OF SYSTEMS:   Review of Systems  Constitutional: Negative for chills and fever.  HENT: Negative for congestion and tinnitus.   Eyes: Negative for blurred vision and double vision.  Respiratory: Positive for shortness of breath. Negative for cough and wheezing.   Cardiovascular: Positive for chest pain. Negative for orthopnea and PND.  Gastrointestinal: Negative for abdominal pain, diarrhea, nausea and vomiting.  Genitourinary: Negative for dysuria and hematuria.  Neurological: Negative for dizziness, sensory change and focal weakness.  All other systems reviewed and are negative.  Tolerating Diet: yes  Tolerating PT: Ambulatory  DRUG ALLERGIES:  No Known Allergies  VITALS:  Blood pressure (!) 141/79, pulse 72, temperature 98.1 F (36.7 C), temperature source Oral, resp. rate 20, height 5' 3.78" (1.62 m), weight 78.6 kg (173 lb 4.5 oz), SpO2 96 %.  PHYSICAL EXAMINATION:   Physical Exam  GENERAL:  45 y.o.-year-old patient lying in bed in NAD.   EYES: Pupils equal, round, reactive to light and accommodation. No scleral icterus. Extraocular muscles intact.  HEENT: Head atraumatic, normocephalic. Oropharynx and nasopharynx clear.  NECK:  Supple, no jugular venous distention. No thyroid enlargement, no tenderness.  LUNGS: Good a/e b/l, no wheezing, rales, rhonchi. No use of accessory muscles of respiration.  CARDIOVASCULAR: S1, S2 normal. No murmurs, rubs, or gallops.  ABDOMEN: Soft, nontender, ++distended. Bowel sounds present. No organomegaly or mass. Ascites and + fluid wave. EXTREMITIES: No cyanosis, clubbing, + 2 edema  b/l.  NEUROLOGIC: Cranial nerves II through XII are intact. No focal Motor or sensory deficits b/l.   PSYCHIATRIC:  patient is alert and oriented x 2.  SKIN: No obvious rash, lesion, or ulcer.   LABORATORY PANEL:  CBC  Recent Labs Lab 04/12/16 0334  WBC 4.0  HGB 10.5*  HCT 30.4*  PLT 82*    Chemistries   Recent Labs Lab 04/11/16 1459 04/12/16 0334  NA 134* 134*  K 4.7 4.7  CL 93* 93*  CO2 23 24  GLUCOSE 97 100*  BUN 39* 41*  CREATININE 12.59* 12.94*  CALCIUM 9.4 9.5  AST 54*  --   ALT 30  --   ALKPHOS 192*  --   BILITOT 1.6*  --    Cardiac Enzymes  Recent Labs Lab 04/12/16 0334  TROPONINI 0.05*   RADIOLOGY:  US Abdomen Limited  Result Date: 04/12/2016 CLINICAL DATA:  Ascites. EXAM: LIMITED ABDOMEN ULTRASOUND FOR ASCITES TECHNIQUE: Limited ultrasound survey for ascites was performed in all four abdominal quadrants. COMPARISON:  None. FINDINGS: Moderate amount of ascites is noted in all 4 quadrants of the abdomen. IMPRESSION: Moderate ascites is noted. Electronically Signed   By: Marijo Conception, M.D.   On: 04/12/2016 15:27   ASSESSMENT AND PLAN:   Mario [name]  is a 45 y.o. male with a known history of End-stage renal disease on hemodialysis, hypertension, renal insufficiency- for last 2-3 weeks started noticing increasing swelling on his legs and his abdomen in spite of going regular for his dialysis. His dialysis on Tuesday Thursday Saturday and the last episode was yesterday for 3-1/2 hours  *Acute  On Chronic systolic  CHF, EF 40% - s/p dialysis X 2 days in a row.  Will get HD again tomorrow.  - appreciate Nephro input.  - possible d/c home after HD tomorrow.   * Uncontrolled hypertension - cont. Metoprolol - cont. PRN hydralazine. -seems pt is noncompliant with meds  * Chronic alcoholism with Ascites and elevated LFT's due to alcholic hepatis and possible cirrhosis - CT chest done in June 2017 reveals paraesophageal varices - US showing  moderate ascites.   - no acute issue presently.  No evidence of Hepatic Encephalopathy. Will get Ammonia in a.m.   * AMS/confusion - Due to alcohol withdrawal and also possible underlying Wernicke's Korsakoff. -Continue CIWA protocol. Landscape architect. - improved since yesterday and will cont. To monitor.   * Thrombocytopenia chronic - stable and will follow counts.  - due to ETOH liver disease.   * Active smoking  - cont. Nicotine patch.   Possible d/c home after HD tomorrow.   Case discussed with Care Management/Social Worker. Management plans discussed with the patient, family and they are in agreement.  CODE STATUS: FULL  DVT Prophylaxis: heparin SQ  TOTAL TIME TAKING CARE OF THIS PATIENT: 30 minutes.   >50% time spent on counselling and coordination of care  POSSIBLE D/C IN 1-2 DAYS, DEPENDING ON CLINICAL CONDITION.  Pt. Seen with help of spanish interpreter.   Note: This dictation was prepared with Dragon dictation along with smaller phrase technology. Any transcriptional errors that result from this process are unintentional.  Henreitta Leber M.D on 04/14/2016 at 3:08 PM  Between 7am to 6pm - Pager - [phone]  After 6pm go to [email or web address] - password [email or web address] [place] [facility]  Office  [phone]  CC: Primary care physician; [name], MD

## 2016-04-15 LAB — RENAL FUNCTION PANEL
ALBUMIN: 3.4 g/dL — AB (ref 3.5–5.0)
ANION GAP: 13 (ref 5–15)
BUN: 28 mg/dL — ABNORMAL HIGH (ref 6–20)
CALCIUM: 9.5 mg/dL (ref 8.9–10.3)
CO2: 32 mmol/L (ref 22–32)
CREATININE: 8.18 mg/dL — AB (ref 0.61–1.24)
Chloride: 91 mmol/L — ABNORMAL LOW (ref 101–111)
GFR, EST AFRICAN AMERICAN: 8 mL/min — AB (ref >60)
GFR, EST NON AFRICAN AMERICAN: 7 mL/min — AB (ref >60)
Glucose, Bld: 129 mg/dL — ABNORMAL HIGH (ref 65–99)
PHOSPHORUS: 4.5 mg/dL (ref 2.5–4.6)
Potassium: 4.5 mmol/L (ref 3.5–5.1)
SODIUM: 136 mmol/L (ref 135–145)

## 2016-04-15 LAB — CBC
HCT: 30.5 % — ABNORMAL LOW (ref 40.0–52.0)
HEMOGLOBIN: 10.5 g/dL — AB (ref 13.0–18.0)
MCH: 35.7 pg — ABNORMAL HIGH (ref 26.0–34.0)
MCHC: 34.3 g/dL (ref 32.0–36.0)
MCV: 104 fL — ABNORMAL HIGH (ref 80.0–100.0)
PLATELETS: 86 10*3/uL — AB (ref 150–440)
RBC: 2.93 MIL/uL — AB (ref 4.40–5.90)
RDW: 15.5 % — ABNORMAL HIGH (ref 11.5–14.5)
WBC: 4.2 10*3/uL (ref 3.8–10.6)

## 2016-04-15 MED ORDER — LORAZEPAM 1 MG PO TABS: 1.0000 mg | ORAL_TABLET | Freq: Four times a day (QID) | ORAL | Status: DC | PRN

## 2016-04-15 MED ORDER — LORAZEPAM 1 MG PO TABS: 0.0000 mg | ORAL_TABLET | Freq: Two times a day (BID) | ORAL | Status: DC

## 2016-04-15 MED ORDER — DIPHENHYDRAMINE HCL 25 MG PO CAPS: 25.0000 mg | ORAL_CAPSULE | Freq: Three times a day (TID) | ORAL | Status: DC | PRN

## 2016-04-15 MED ORDER — LORAZEPAM 2 MG/ML IJ SOLN
1.0000 mg | Freq: Four times a day (QID) | INTRAMUSCULAR | Status: DC | PRN
Start: 2016-04-15 — End: 2016-04-16

## 2016-04-15 MED ORDER — LORAZEPAM 1 MG PO TABS
0.0000 mg | ORAL_TABLET | Freq: Four times a day (QID) | ORAL | Status: DC
  Administered 2016-04-15 – 2016-04-16 (×2): 1 mg via ORAL
  Filled 2016-04-15 (×2): qty 1

## 2016-04-15 NOTE — Progress Notes (Signed)
Pre Dialysis 

## 2016-04-15 NOTE — Care Management (Signed)
Spoke with patient through an interpretor.  Patient acknowedges that transportation issues do affect his compliance with dialysis regimen .  He relies on friends to take him and pick him up around their work schedules. They take him to the center at 6AM and patient waits for his appointment around 10:45.  He says that many times even though his appointment is at 10:45- he is taken back late and his treatment starts later. This means he has to stop his treatment before it is finished inorder to have tranposrtaion back home.  Spoke with Marcelene Butte the social worker at [employer].  She is unaware that patient arrives for appointments so early but does say patient's dialysis time is 12:20p - not 10:45.  She says that there is nothing she can offer to assist with transportation. Asked that she discuss the dialysis time with patient.  Patient says his friends help him pay for his medications.He says his ascites is much worse than usual.  Says in [place], he had the fluid removed.   Patient to have US guided paracentesis today and HD.  Was informed that patient could not have paracentesis today due to dialysis treatment.  Patient feels he will benefit from this treatment because his stomach "is very large" and causing him discomfort.  Will be able to obtain some of his discharge medications form [facility].  UPdated attending

## 2016-04-15 NOTE — Progress Notes (Signed)
Dialysis tx terminated

## 2016-04-15 NOTE — Progress Notes (Signed)
Post dialysis 

## 2016-04-15 NOTE — Progress Notes (Signed)
PT Cancellation Note  Patient Details Name: Mario [name] MRN: [id number] DOB: Apr 27, 1971   Cancelled Treatment:    Reason Eval/Treat Not Completed: Patient at procedure or test/unavailable (Consult recieved, chart reviewed; nursing consulted pt. leaving for hemodialysis. will re-attempt tomorrow)   Melanie Crazier 04/15/2016, 1:45 PM

## 2016-04-15 NOTE — Progress Notes (Signed)
[facility]  ROUNDING NOTE   Subjective:   Sitter at bedside.  Feels better  Dialysis for later today  Objective:  Vital signs in last 24 hours:  Temp:  [97.7 F (36.5 C)-98.1 F (36.7 C)] 97.9 F (36.6 C) (09/12 0543) Pulse Rate:  [67-76] 67 (09/12 0543) Resp:  [18-20] 18 (09/12 0543) BP: (141-152)/(72-87) 149/72 (09/12 0543) SpO2:  [96 %-99 %] 97 % (09/12 0543)  Weight change:  Filed Weights   04/12/16 1549 04/13/16 1300 04/13/16 1630  Weight: 80 kg (176 lb 6.4 oz) 80.2 kg (176 lb 12.9 oz) 78.6 kg (173 lb 4.5 oz)    Intake/Output: I/O last 3 completed shifts: In: 480 [P.O.:480] Out: 0    Intake/Output this shift:  No intake/output data recorded.  Physical Exam: General: No acute distress  Head: Normocephalic, atraumatic. Moist oral mucosal membranes  Eyes: Anicteric  Neck: Supple, trachea midline  Lungs:  Clear to auscultation, normal effort  Heart: S1S2 no rubs  Abdomen:  Soft, nontender, Bowel sounds present   Extremities: trace peripheral edema.  Neurologic: alert  Skin: No lesions  Access: LUE AVF, aneurysmal    Basic Metabolic Panel:  Recent Labs Lab 04/11/16 1459 04/12/16 0334 04/13/16 1305  NA 134* 134*  --   K 4.7 4.7  --   CL 93* 93*  --   CO2 23 24  --   GLUCOSE 97 100*  --   BUN 39* 41*  --   CREATININE 12.59* 12.94*  --   CALCIUM 9.4 9.5  --   PHOS  --  8.0* 5.7*    Liver Function Tests:  Recent Labs Lab 04/11/16 1459  AST 54*  ALT 30  ALKPHOS 192*  BILITOT 1.6*  PROT 7.7  ALBUMIN 3.7    Recent Labs Lab 04/11/16 1459  LIPASE 50   No results for input(s): AMMONIA in the last 168 hours.  CBC:  Recent Labs Lab 04/11/16 1459 04/12/16 0334 04/15/16 0445  WBC 3.9 4.0 4.2  HGB 11.2* 10.5* 10.5*  HCT 33.1* 30.4* 30.5*  MCV 102.7* 102.5* 104.0*  PLT 102* 82* 86*    Cardiac Enzymes:  Recent Labs Lab 04/11/16 1459 04/11/16 1915 04/11/16 2242 04/12/16 0334  TROPONINI 0.05* 0.06* 0.06* 0.05*     BNP: Invalid input(s): POCBNP  CBG: No results for input(s): GLUCAP in the last 168 hours.  Microbiology: Results for orders placed or performed during the hospital encounter of 04/11/16  MRSA PCR Screening     Status: None   Collection Time: 04/11/16  9:00 PM  Result Value Ref Range Status   MRSA by PCR NEGATIVE NEGATIVE Final    Comment:        The GeneXpert MRSA Assay (FDA approved for NASAL specimens only), is one component of a comprehensive MRSA colonization surveillance program. It is not intended to diagnose MRSA infection nor to guide or monitor treatment for MRSA infections.     Coagulation Studies: No results for input(s): LABPROT, INR in the last 72 hours.  Urinalysis: No results for input(s): COLORURINE, LABSPEC, PHURINE, GLUCOSEU, HGBUR, BILIRUBINUR, KETONESUR, PROTEINUR, UROBILINOGEN, NITRITE, LEUKOCYTESUR in the last 72 hours.  Invalid input(s): APPERANCEUR    Imaging: No results found.   Medications:     . calcium acetate  667 mg Oral TID WC  . feeding supplement (NEPRO CARB STEADY)  237 mL Oral BID BM  . folic acid  1 mg Oral Daily  . heparin  5,000 Units Subcutaneous Q8H  . lanthanum  1,000 mg Oral Q breakfast  . metoprolol tartrate  25 mg Oral BID  . multivitamin  1 tablet Oral Daily  . polyethylene glycol  17 g Oral Daily  . senna  1 tablet Oral BID  . sodium chloride flush  3 mL Intravenous Q12H  . thiamine  100 mg Oral Daily   Or  . thiamine  100 mg Intravenous Daily   haloperidol lactate, hydrALAZINE, HYDROcodone-acetaminophen, morphine injection, nitroGLYCERIN, ondansetron (ZOFRAN) IV  Assessment/ Plan:  45 y.o. Hispanic male with a PMHX of end-stage renal disease on hemodialysis Tuesday, Thursday, Saturday, hypertension, anemia chronic kidney disease, secondary hyperparathyroidism, left upper extremity AV fistula,   TTS [facility] [place].   1. ESRD on HD TTS. Extra treatment 9/10. Next treatment for later  today - Continue TTS schedule  2. Anemia of chronic kidney disease. Hemoglobin 10.5. Macrocytic. Hold epo for now. Secondary to chronic alcoholism  3. Secondary hyperparathyroidism. With hyperphosphatemia - Lanthanum and calcium acetate  4. Hypertension: elevated due to agitation and alcohol withdrawal.    LOS: 3 Carrieanne Kleen 9/12/201710:32 AM

## 2016-04-15 NOTE — Progress Notes (Signed)
Patient complaining of itching and agitation noted. Doctor notified. Doctor to put in orders.

## 2016-04-15 NOTE — Progress Notes (Signed)
  SUBJECTIVE: The patient is resting in bed and admits to small amount of chest discomfort that seems to be pleuritic in nature and complains of more abdominal discomfort. He is asking to take a shower.   Vitals:   04/14/16 0859 04/14/16 1108 04/14/16 1949 04/15/16 0543  BP: (!) 165/81 (!) 141/79 (!) 152/87 (!) 149/72  Pulse: 78 72 76 67  Resp: 20 20 20 18   Temp: 98.1 F (36.7 C) 98.1 F (36.7 C) 97.7 F (36.5 C) 97.9 F (36.6 C)  TempSrc: Oral Oral Oral Oral  SpO2: 96% 96% 99% 97%  Weight:      Height:        Intake/Output Summary (Last 24 hours) at 04/15/16 0831 Last data filed at 04/15/16 0112  Gross per 24 hour  Intake              480 ml  Output                0 ml  Net              480 ml    LABS: Basic Metabolic Panel:  Recent Labs  04/13/16 1305  PHOS 5.7*   Liver Function Tests: No results for input(s): AST, ALT, ALKPHOS, BILITOT, PROT, ALBUMIN in the last 72 hours. No results for input(s): LIPASE, AMYLASE in the last 72 hours. CBC:  Recent Labs  04/15/16 0445  WBC 4.2  HGB 10.5*  HCT 30.5*  MCV 104.0*  PLT 86*   Cardiac Enzymes: No results for input(s): CKTOTAL, CKMB, CKMBINDEX, TROPONINI in the last 72 hours. BNP: Invalid input(s): POCBNP D-Dimer: No results for input(s): DDIMER in the last 72 hours. Hemoglobin A1C: No results for input(s): HGBA1C in the last 72 hours. Fasting Lipid Panel: No results for input(s): CHOL, HDL, LDLCALC, TRIG, CHOLHDL, LDLDIRECT in the last 72 hours. Thyroid Function Tests: No results for input(s): TSH, T4TOTAL, T3FREE, THYROIDAB in the last 72 hours.  Invalid input(s): FREET3 Anemia Panel: No results for input(s): VITAMINB12, FOLATE, FERRITIN, TIBC, IRON, RETICCTPCT in the last 72 hours.   PHYSICAL EXAM General: Chronically ill-appearing, well nourished, in no acute distress HEENT:  Normocephalic and atramatic Neck:  No JVD.  Lungs: Clear bilaterally to auscultation and percussion. Heart: HRRR . Normal  S1 and S2 without gallops or murmurs.  Abdomen: Bowel sounds are positive, abdomen soft and non-tender  Msk:  Back normal, normal gait. Normal strength and tone for age. Extremities: No clubbing, cyanosis or edema.   Neuro: Alert and oriented to person and place. Psych:  Good affect, responds appropriately  TELEMETRY: Sinus rhythm with rates in the 70s  ASSESSMENT AND PLAN: The patient was admitted with altered mental status, which seems to have improved. He has been having chest pain which appears to be atypical, pleuritic type pain. Myocardial infarction has been ruled out. He has mildly elevated troponins which are probably related to renal failure, fluid overload. His EKG was normal. We'll plan on outpatient stress test and follow-up in our office.  Principal Problem:   Chest pain Active Problems:   Fluid overload    Daune Perch, NP 04/15/2016 8:31 AM

## 2016-04-15 NOTE — Progress Notes (Signed)
[place] [facility] [place] NAME: Mario [name]    MR#:  [id number]  DATE OF BIRTH:  [birth year]  SUBJECTIVE:   Patient here due to shortness of breath and lower extremity edema noted to be volume overloaded.  Patient seen with the help of a Spanish interpreter. He complains of abdominal distention and bloating. Also says that he had a small BM today.  REVIEW OF SYSTEMS:   Review of Systems  Constitutional: Negative for chills and fever.  HENT: Negative for congestion and tinnitus.   Eyes: Negative for blurred vision and double vision.  Respiratory: Positive for shortness of breath. Negative for cough and wheezing.   Cardiovascular: Negative for chest pain, orthopnea and PND.  Gastrointestinal: Negative for abdominal pain, diarrhea, nausea and vomiting.  Genitourinary: Negative for dysuria and hematuria.  Neurological: Negative for dizziness, sensory change and focal weakness.  All other systems reviewed and are negative.  Tolerating Diet: yes  Tolerating PT: Ambulatory  DRUG ALLERGIES:  No Known Allergies  VITALS:  Blood pressure (!) 165/86, pulse 74, temperature 97.8 F (36.6 C), temperature source Oral, resp. rate 16, height 5' 3.78" (1.62 m), weight 80.1 kg (176 lb 9.4 oz), SpO2 100 %.  PHYSICAL EXAMINATION:   Physical Exam  GENERAL:  45 y.o.-year-old patient lying in bed in NAD.   EYES: Pupils equal, round, reactive to light and accommodation. No scleral icterus. Extraocular muscles intact.  HEENT: Head atraumatic, normocephalic. Oropharynx and nasopharynx clear.  NECK:  Supple, no jugular venous distention. No thyroid enlargement, no tenderness.  LUNGS: Good a/e b/l, no wheezing, rales, rhonchi. No use of accessory muscles of respiration.  CARDIOVASCULAR: S1, S2 normal. No murmurs, rubs, or gallops.  ABDOMEN: Soft, nontender, ++ distended. Bowel sounds present. No organomegaly or mass. Ascites and + fluid  wave. EXTREMITIES: No cyanosis, clubbing, + 2 edema b/l.  NEUROLOGIC: Cranial nerves II through XII are intact. No focal Motor or sensory deficits b/l.   PSYCHIATRIC:  patient is alert and oriented x 2.  SKIN: No obvious rash, lesion, or ulcer.   Left upper ext. AV fistula with good bruit, good thrill.    LABORATORY PANEL:  CBC  Recent Labs Lab 04/15/16 0445  WBC 4.2  HGB 10.5*  HCT 30.5*  PLT 86*    Chemistries   Recent Labs Lab 04/11/16 1459  04/15/16 1140  NA 134*  < > 136  K 4.7  < > 4.5  CL 93*  < > 91*  CO2 23  < > 32  GLUCOSE 97  < > 129*  BUN 39*  < > 28*  CREATININE 12.59*  < > 8.18*  CALCIUM 9.4  < > 9.5  AST 54*  --   --   ALT 30  --   --   ALKPHOS 192*  --   --   BILITOT 1.6*  --   --   < > = values in this interval not displayed. Cardiac Enzymes  Recent Labs Lab 04/12/16 0334  TROPONINI 0.05*   RADIOLOGY:  No results found. ASSESSMENT AND PLAN:   Mario [name]  is a 45 y.o. male with a known history of End-stage renal disease on hemodialysis, hypertension, renal insufficiency- for last 2-3 weeks started noticing increasing swelling on his legs and his abdomen in spite of going regular for his dialysis. His dialysis on Tuesday Thursday Saturday and the last episode was yesterday for 3-1/2 hours  *Acute  On Chronic systolic  CHF, EF 40% - improved with HD and will cont. To monitor.  - Ambulate and assess for Home O2 today as he continues to complain of some shortness of breath.   * Uncontrolled hypertension - cont. Metoprolol - cont. PRN hydralazine, HD and will monitor.   * Chronic alcoholism with Ascites and elevated LFT's due to alcholic hepatis and possible cirrhosis - CT chest done in June 2017 reveals paraesophageal varices - US showing moderate ascites.  Pt. Complaining of abdominal distension and shortness of breath. Will attempt to get US guided paracentesis tomorrow.  - no acute issue presently.  No evidence of Hepatic  Encephalopathy.  * AMS/confusion - Due to alcohol withdrawal and also possible underlying Wernicke's Korsakoff. -Continue CIWA protocol. Landscape architect. - much improved since past 2 days.  No evidence of DT's presently.   * Thrombocytopenia chronic - stable and will follow counts.  - due to ETOH liver disease.   * Active smoking  - cont. Nicotine patch.   D/c home after US guided Paracentesis tomorrow.  CM to help with meds.   Case discussed with Care Management/Social Worker. Management plans discussed with the patient, family and they are in agreement.  CODE STATUS: FULL  DVT Prophylaxis: heparin SQ  TOTAL TIME TAKING CARE OF THIS PATIENT: 35 minutes.   >50% time spent on counselling and coordination of care  POSSIBLE D/C IN 1-2 DAYS, DEPENDING ON CLINICAL CONDITION.  Pt. Seen with help of spanish interpreter.   Note: This dictation was prepared with Dragon dictation along with smaller phrase technology. Any transcriptional errors that result from this process are unintentional.  Henreitta Leber M.D on 04/15/2016 at 3:23 PM  Between 7am to 6pm - Pager - [phone]  After 6pm go to [email or web address] - password [email or web address] [facility]  Office  [phone]  CC: Primary care physician; [name], MD

## 2016-04-15 NOTE — Progress Notes (Signed)
Dialysis started 

## 2016-04-16 ENCOUNTER — Inpatient Hospital Stay: Payer: Medicaid Other

## 2016-04-16 LAB — BODY FLUID CELL COUNT WITH DIFFERENTIAL
EOS FL: 0 %
LYMPHS FL: 34 %
MONOCYTE-MACROPHAGE-SEROUS FLUID: 65 %
NEUTROPHIL FLUID: 1 %
Total Nucleated Cell Count, Fluid: 1511 cu mm

## 2016-04-16 LAB — ALBUMIN, FLUID (OTHER): ALBUMIN FL: 1.9 g/dL

## 2016-04-16 LAB — GRAM STAIN

## 2016-04-16 LAB — GLUCOSE, PERITONEAL FLUID: GLUCOSE, PERITONEAL FLUID: 94 mg/dL

## 2016-04-16 LAB — PROTEIN, BODY FLUID: Total protein, fluid: 3.7 g/dL

## 2016-04-16 MED ORDER — LISINOPRIL 5 MG PO TABS: 5.0000 mg | ORAL_TABLET | Freq: Every day | ORAL | 1 refills | Status: DC

## 2016-04-16 MED ORDER — METOPROLOL TARTRATE 25 MG PO TABS: 25.0000 mg | ORAL_TABLET | Freq: Two times a day (BID) | ORAL | 0 refills | Status: DC

## 2016-04-16 NOTE — Progress Notes (Signed)
[facility]  ROUNDING NOTE   Subjective:   History taken with assistant from interpreter.  Hemodialysis treatment yesterday. Tolerated treatment well. UF of 1280  Large volume ultrasound guided paracentesis this morning. Removed 3.6 litres.   Objective:  Vital signs in last 24 hours:  Temp:  [97.5 F (36.4 C)-98.4 F (36.9 C)] 97.7 F (36.5 C) (09/13 0818) Pulse Rate:  [70-83] 71 (09/13 0818) Resp:  [16-27] 18 (09/13 0954) BP: (141-200)/(74-96) 141/74 (09/13 1000) SpO2:  [94 %-100 %] 94 % (09/13 1000) Weight:  [77.6 kg (171 lb 1.2 oz)-80.1 kg (176 lb 9.4 oz)] 77.6 kg (171 lb 1.2 oz) (09/12 1700)  Weight change:  Filed Weights   04/15/16 1350 04/15/16 1645 04/15/16 1700  Weight: 80.1 kg (176 lb 9.4 oz) 78.9 kg (173 lb 15.1 oz) 77.6 kg (171 lb 1.2 oz)    Intake/Output: I/O last 3 completed shifts: In: 240 [P.O.:240] Out: 1280 [Other:1280]   Intake/Output this shift:  No intake/output data recorded.  Physical Exam: General: No acute distress  Head: Normocephalic, atraumatic. Moist oral mucosal membranes  Eyes: Anicteric  Neck: Supple, trachea midline  Lungs:  Clear to auscultation, normal effort  Heart: S1S2 no rubs  Abdomen:  Soft, nontender, Bowel sounds present   Extremities: No peripheral edema.  Neurologic: alert  Skin: No lesions  Access: LUE AVF, aneurysmal    Basic Metabolic Panel:  Recent Labs Lab 04/11/16 1459 04/12/16 0334 04/13/16 1305 04/15/16 1140  NA 134* 134*  --  136  K 4.7 4.7  --  4.5  CL 93* 93*  --  91*  CO2 23 24  --  32  GLUCOSE 97 100*  --  129*  BUN 39* 41*  --  28*  CREATININE 12.59* 12.94*  --  8.18*  CALCIUM 9.4 9.5  --  9.5  PHOS  --  8.0* 5.7* 4.5    Liver Function Tests:  Recent Labs Lab 04/11/16 1459 04/15/16 1140  AST 54*  --   ALT 30  --   ALKPHOS 192*  --   BILITOT 1.6*  --   PROT 7.7  --   ALBUMIN 3.7 3.4*    Recent Labs Lab 04/11/16 1459  LIPASE 50   No results for input(s): AMMONIA  in the last 168 hours.  CBC:  Recent Labs Lab 04/11/16 1459 04/12/16 0334 04/15/16 0445  WBC 3.9 4.0 4.2  HGB 11.2* 10.5* 10.5*  HCT 33.1* 30.4* 30.5*  MCV 102.7* 102.5* 104.0*  PLT 102* 82* 86*    Cardiac Enzymes:  Recent Labs Lab 04/11/16 1459 04/11/16 1915 04/11/16 2242 04/12/16 0334  TROPONINI 0.05* 0.06* 0.06* 0.05*    BNP: Invalid input(s): POCBNP  CBG: No results for input(s): GLUCAP in the last 168 hours.  Microbiology: Results for orders placed or performed during the hospital encounter of 04/11/16  MRSA PCR Screening     Status: None   Collection Time: 04/11/16  9:00 PM  Result Value Ref Range Status   MRSA by PCR NEGATIVE NEGATIVE Final    Comment:        The GeneXpert MRSA Assay (FDA approved for NASAL specimens only), is one component of a comprehensive MRSA colonization surveillance program. It is not intended to diagnose MRSA infection nor to guide or monitor treatment for MRSA infections.     Coagulation Studies: No results for input(s): LABPROT, INR in the last 72 hours.  Urinalysis: No results for input(s): COLORURINE, LABSPEC, [place], [place], [place], [place], [place], [place], [place],  NITRITE, LEUKOCYTESUR in the last 72 hours.  Invalid input(s): APPERANCEUR    Imaging: US Paracentesis  Result Date: 04/16/2016 INDICATION: Symptomatic ascites. Please perform ultrasound-guided paracentesis for diagnostic and therapeutic purposes. EXAM: ULTRASOUND-GUIDED PARACENTESIS COMPARISON:  Ascites search ultrasound - 04/12/2016 MEDICATIONS: None. COMPLICATIONS: None immediate. TECHNIQUE: Informed written consent was obtained from the patient after a discussion of the risks, benefits and alternatives to treatment. A timeout was performed prior to the initiation of the procedure. Initial ultrasound scanning demonstrates a moderate amount of ascites within the right lower abdominal quadrant. The right lower abdomen was prepped and  draped in the usual sterile fashion. 1% lidocaine with epinephrine was used for local anesthesia. An ultrasound image was saved for documentation purposed. An 8 Fr Safe-T-Centesis catheter was introduced. The paracentesis was performed. The catheter was removed and a dressing was applied. The patient tolerated the procedure well without immediate post procedural complication. FINDINGS: A total of approximately 3.6 liters of serous fluid was removed. Samples were sent to the laboratory as requested by the clinical team. IMPRESSION: Successful ultrasound-guided paracentesis yielding 3.6 liters of peritoneal fluid. Electronically Signed   By: Sandi Mariscal M.D.   On: 04/16/2016 10:29     Medications:     . calcium acetate  667 mg Oral TID WC  . feeding supplement (NEPRO CARB STEADY)  237 mL Oral BID BM  . folic acid  1 mg Oral Daily  . heparin  5,000 Units Subcutaneous Q8H  . lanthanum  1,000 mg Oral Q breakfast  . LORazepam  0-4 mg Oral Q6H   Followed by  . [START ON 04/17/2016] LORazepam  0-4 mg Oral Q12H  . metoprolol tartrate  25 mg Oral BID  . multivitamin  1 tablet Oral Daily  . polyethylene glycol  17 g Oral Daily  . senna  1 tablet Oral BID  . sodium chloride flush  3 mL Intravenous Q12H  . thiamine  100 mg Oral Daily   Or  . thiamine  100 mg Intravenous Daily   diphenhydrAMINE, haloperidol lactate, hydrALAZINE, HYDROcodone-acetaminophen, LORazepam **OR** LORazepam, morphine injection, nitroGLYCERIN, ondansetron (ZOFRAN) IV  Assessment/ Plan:  45 y.o. Hispanic male with a PMHX of end-stage renal disease on hemodialysis Tuesday, Thursday, Saturday, hypertension, anemia chronic kidney disease, secondary hyperparathyroidism, left upper extremity AV fistula,   TTS [facility] [place].   1. ESRD on HD TTS. Extra treatment 9/10. Next treatment for later today - Continue TTS schedule  2. Anemia of chronic kidney disease. Hemoglobin 10.5. Macrocytic. Hold epo for now. Secondary  to chronic alcoholism - Mircera as outpatient.   3. Secondary hyperparathyroidism. With hyperphosphatemia - Lanthanum and calcium acetate.   4. Hypertension: elevated due to agitation and alcohol withdrawal.  - metoprolol.    LOS: 4 Bibiana Gillean 9/13/201710:50 AM

## 2016-04-16 NOTE — Care Management (Signed)
Paracentesis  with removal of 3 litersFaxed patient's prescriptions for lisinopril and [facility].  Provided patient with the applications for [facility] and [facility].  Discussed speaking with the social worker at the dialysis clinic about clinic time

## 2016-04-16 NOTE — Progress Notes (Signed)
  SUBJECTIVE: Pt is awake and alert. Complaining of abdominal discomfort.   Vitals:   04/16/16 0619 04/16/16 0818 04/16/16 0954 04/16/16 1000  BP: (!) 153/77 (!) 168/84 (!) 151/74 (!) 141/74  Pulse: 70 71    Resp:  18 18   Temp: 98.4 F (36.9 C) 97.7 F (36.5 C)    TempSrc: Oral Oral    SpO2: 98% 99% 99% 94%  Weight:      Height:        Intake/Output Summary (Last 24 hours) at 04/16/16 1202 Last data filed at 04/15/16 1815  Gross per 24 hour  Intake                0 ml  Output             1280 ml  Net            -1280 ml    LABS: Basic Metabolic Panel:  Recent Labs  04/13/16 1305 04/15/16 1140  NA  --  136  K  --  4.5  CL  --  91*  CO2  --  32  GLUCOSE  --  129*  BUN  --  28*  CREATININE  --  8.18*  CALCIUM  --  9.5  PHOS 5.7* 4.5   Liver Function Tests:  Recent Labs  04/15/16 1140  ALBUMIN 3.4*   No results for input(s): LIPASE, AMYLASE in the last 72 hours. CBC:  Recent Labs  04/15/16 0445  WBC 4.2  HGB 10.5*  HCT 30.5*  MCV 104.0*  PLT 86*   Cardiac Enzymes: No results for input(s): CKTOTAL, CKMB, CKMBINDEX, TROPONINI in the last 72 hours. BNP: Invalid input(s): POCBNP D-Dimer: No results for input(s): DDIMER in the last 72 hours. Hemoglobin A1C: No results for input(s): HGBA1C in the last 72 hours. Fasting Lipid Panel: No results for input(s): CHOL, HDL, LDLCALC, TRIG, CHOLHDL, LDLDIRECT in the last 72 hours. Thyroid Function Tests: No results for input(s): TSH, T4TOTAL, T3FREE, THYROIDAB in the last 72 hours.  Invalid input(s): FREET3 Anemia Panel: No results for input(s): VITAMINB12, FOLATE, FERRITIN, TIBC, IRON, RETICCTPCT in the last 72 hours.   PHYSICAL EXAM General: Well developed, well nourished, in no acute distress HEENT:  Normocephalic and atramatic Neck:  No JVD.  Lungs: Clear bilaterally to auscultation and percussion. Heart: HRRR . Murmur Abdomen: Distended Msk:  Back normal, normal gait. Normal strength and tone  for age. Extremities: No clubbing, cyanosis or edema.   Neuro: Alert and oriented X 3. Psych:  Irritable  TELEMETRY: NSR 75 bpm  ASSESSMENT AND PLAN: The patient was admitted with altered mental status, which seems to have improved. He has been having chest pain which appears to be atypical, pleuritic type pain. Myocardial infarction has been ruled out. He has mildly elevated troponins which are probably related to renal failure, fluid overload. His EKG was normal. We'll plan on outpatient stress test and follow-up in our office.   Principal Problem:   Chest pain Active Problems:   Fluid overload    Daune Perch, NP 04/16/2016 12:02 PM

## 2016-04-16 NOTE — Progress Notes (Signed)
PT Cancellation Note  Patient Details Name: Mario [name] MRN: [id number] DOB: 1970/12/16   Cancelled Treatment:    Reason Eval/Treat Not Completed: Patient at procedure or test/unavailable (Consult recieved, chart reviewed. Nsg consulted and interpretor called, upon arrival to pt. room he was being transported to [place]. Will re-attempt at later time/date)  Melanie Crazier, SPT  04/16/16,9:09 AM

## 2016-04-16 NOTE — Discharge Summary (Signed)
[name] at [name] NAME: Mario [name]    MR#:  [id number]  DATE OF BIRTH:  05/13/1971  DATE OF ADMISSION:  04/11/2016 ADMITTING PHYSICIAN: Mario Basta, MD  DATE OF DISCHARGE: 04/16/2016  PRIMARY CARE PHYSICIAN: Mario Legato, MD    ADMISSION DIAGNOSIS:  Nonspecific chest pain [R07.9]  DISCHARGE DIAGNOSIS:  Principal Problem:   Chest pain Active Problems:   Fluid overload   SECONDARY DIAGNOSIS:   Past Medical History:  Diagnosis Date  . Dialysis patient ([place])   . Hypertension   . Renal disorder   . Renal insufficiency     HOSPITAL COURSE:   Mario [name] Mario [name] a 45 y.o.malewith a known history of End-stage renal disease on hemodialysis, hypertension, renal insufficiency- for last 2-3 weeks started noticing increasing swelling on his legs and his abdomen in spite of going regular for his dialysis.  *Acute  On Chronic systolic CHF, EF 78%-GNFA was secondary to noncompliance as patient has missed his dialysis sessions and also was getting incomplete dialysis when he was actually there. -Patient received scheduled dialysis in the hospital and his volume status has improved and is clinically doing well and therefore being discharged home.  * Uncontrolled hypertension - also secondary to medical noncompliance -Patient was placed on eyes needed IV hydralazine, oral metoprolol and this has improved. He is not being discharged on oral metoprolol and lisinopril.   * Chronic alcoholism with Ascites and elevated LFT's due to alcholic hepatis/cirrhosis -Patient did undergo ultrasound-guided paracentesis on the day of discharge and had 3.6 L of fluid removed. -He has no clinical evidence of hepatic encephalopathy. He was told to abstain from alcohol abuse.  * AMS/confusion - Due to alcohol withdrawal and also possible underlying Wernicke's Korsakoff. -On the hospital patient was maintained on CIWA protocol. He has  clinically improved. He has no evidence of DTs presently and now being discharged home.  * Thrombocytopenia chronic - patient's counts are stable and this can be further followed as an outpatient. This is secondary to his alcohol abuse.  DISCHARGE CONDITIONS:   Stable  CONSULTS OBTAINED:  Treatment Team:  Mario Legato, MD Mario David, MD  DRUG ALLERGIES:  No Known Allergies  DISCHARGE MEDICATIONS:     Medication List    TAKE these medications   HYDROcodone-acetaminophen 5-325 MG tablet Commonly known as:  NORCO/VICODIN Take 1-2 tablets by mouth every 4 (four) hours as needed for moderate pain.   lanthanum 1000 MG chewable tablet Commonly known as:  FOSRENOL Chew 1,500 mg by mouth daily.   lisinopril 5 MG tablet Commonly known as:  PRINIVIL,ZESTRIL Take 1 tablet (5 mg total) by mouth daily.   metoprolol tartrate 25 MG tablet Commonly known as:  LOPRESSOR Take 1 tablet (25 mg total) by mouth 2 (two) times daily.   sevelamer carbonate 800 MG tablet Commonly known as:  RENVELA Take 2,400 mg by mouth 3 (three) times daily with meals.         DISCHARGE INSTRUCTIONS:   DIET:  Cardiac diet and Renal diet  DISCHARGE CONDITION:  Stable  ACTIVITY:  Activity as tolerated  OXYGEN:  Home Oxygen: No.   Oxygen Delivery: room air  DISCHARGE LOCATION:  home   If you experience worsening of your admission symptoms, develop shortness of breath, life threatening emergency, suicidal or homicidal thoughts you must seek medical attention immediately by calling 911 or calling your MD immediately  if symptoms less severe.  You Must read complete instructions/literature along  with all the possible adverse reactions/side effects for all the Medicines you take and that have been prescribed to you. Take any new Medicines after you have completely understood and accpet all the possible adverse reactions/side effects.   Please note  You were cared for by a hospitalist  during your hospital stay. If you have any questions about your discharge medications or the care you received while you were in the hospital after you are discharged, you can call the unit and asked to speak with the hospitalist on call if the hospitalist that took care of you is not available. Once you are discharged, your primary care physician will handle any further medical issues. Please note that NO REFILLS for any discharge medications will be authorized once you are discharged, as it is imperative that you return to your primary care physician (or establish a relationship with a primary care physician if you do not have one) for your aftercare needs so that they can reassess your need for medications and monitor your lab values.     Today   Abdominal distention improved. No shortness of breath. No longer constipated. Wants to go home.  VITAL SIGNS:  Blood pressure (!) 141/74, pulse 71, temperature 97.7 F (36.5 C), temperature source Oral, resp. rate 18, height 5' 3.78" (1.62 m), weight 77.6 kg (171 lb 1.2 oz), SpO2 94 %.  I/O:   Intake/Output Summary (Last 24 hours) at 04/16/16 1358 Last data filed at 04/15/16 1815  Gross per 24 hour  Intake                0 ml  Output             1280 ml  Net            -1280 ml    PHYSICAL EXAMINATION:   GENERAL:  45 y.o.-year-old patient lying in bed in NAD.   EYES: Pupils equal, round, reactive to light and accommodation. No scleral icterus. Extraocular muscles intact.  HEENT: Head atraumatic, normocephalic. Oropharynx and nasopharynx clear.  NECK:  Supple, no jugular venous distention. No thyroid enlargement, no tenderness.  LUNGS: Good a/e b/l, no wheezing, rales, rhonchi. No use of accessory muscles of respiration.  CARDIOVASCULAR: S1, S2 normal. No murmurs, rubs, or gallops.  ABDOMEN: Soft, nontender, ++ distended. Bowel sounds present. No organomegaly or mass. Ascites and + fluid wave. EXTREMITIES: No cyanosis, clubbing, + 2 edema  b/l.  NEUROLOGIC: Cranial nerves II through XII are intact. No focal Motor or sensory deficits b/l.   PSYCHIATRIC:  patient is alert and oriented x 2.  SKIN: No obvious rash, lesion, or ulcer.   Left upper ext. AV fistula with good bruit, good thrill.     DATA REVIEW:   CBC  Recent Labs Lab 04/15/16 0445  WBC 4.2  HGB 10.5*  HCT 30.5*  PLT 86*    Chemistries   Recent Labs Lab 04/11/16 1459  04/15/16 1140  NA 134*  < > 136  K 4.7  < > 4.5  CL 93*  < > 91*  CO2 23  < > 32  GLUCOSE 97  < > 129*  BUN 39*  < > 28*  CREATININE 12.59*  < > 8.18*  CALCIUM 9.4  < > 9.5  AST 54*  --   --   ALT 30  --   --   ALKPHOS 192*  --   --   BILITOT 1.6*  --   --   < > =  values in this interval not displayed.  Cardiac Enzymes  Recent Labs Lab 04/12/16 0334  TROPONINI 0.05*    Microbiology Results  Results for orders placed or performed during the hospital encounter of 04/11/16  MRSA PCR Screening     Status: None   Collection Time: 04/11/16  9:00 PM  Result Value Ref Range Status   MRSA by PCR NEGATIVE NEGATIVE Final    Comment:        The GeneXpert MRSA Assay (FDA approved for NASAL specimens only), is one component of a comprehensive MRSA colonization surveillance program. It is not intended to diagnose MRSA infection nor to guide or monitor treatment for MRSA infections.     RADIOLOGY:  US Paracentesis  Result Date: 04/16/2016 INDICATION: Symptomatic ascites. Please perform ultrasound-guided paracentesis for diagnostic and therapeutic purposes. EXAM: ULTRASOUND-GUIDED PARACENTESIS COMPARISON:  Ascites search ultrasound - 04/12/2016 MEDICATIONS: None. COMPLICATIONS: None immediate. TECHNIQUE: Informed written consent was obtained from the patient after a discussion of the risks, benefits and alternatives to treatment. A timeout was performed prior to the initiation of the procedure. Initial ultrasound scanning demonstrates a moderate amount of ascites within the  right lower abdominal quadrant. The right lower abdomen was prepped and draped in the usual sterile fashion. 1% lidocaine with epinephrine was used for local anesthesia. An ultrasound image was saved for documentation purposed. An 8 Fr Safe-T-Centesis catheter was introduced. The paracentesis was performed. The catheter was removed and a dressing was applied. The patient tolerated the procedure well without immediate post procedural complication. FINDINGS: A total of approximately 3.6 liters of serous fluid was removed. Samples were sent to the laboratory as requested by the clinical team. IMPRESSION: Successful ultrasound-guided paracentesis yielding 3.6 liters of peritoneal fluid. Electronically Signed   By: Sandi Mariscal M.D.   On: 04/16/2016 10:29      Management plans discussed with the patient, family and they are in agreement.  CODE STATUS:     Code Status Orders        Start     Ordered   04/11/16 1858  Full code  Continuous     04/11/16 1857    Code Status History    Date Active Date Inactive Code Status Order ID Comments User Context   04/11/2016  6:57 PM 04/12/2016  7:16 AM Full Code [id number]  Mario Basta, MD Inpatient   01/25/2015 12:29 PM 01/28/2015  4:26 PM Full Code [id number]  Dustin Flock, MD Inpatient      TOTAL TIME TAKING CARE OF THIS PATIENT: 40 minutes.    Henreitta Leber M.D on 04/16/2016 at 1:58 PM  Between 7am to 6pm - Pager - [phone]  After 6pm go to [email or web address] - Proofreader  [facility]  Office  [phone]  CC: Primary care physician; Mario Legato, MD

## 2016-04-16 NOTE — Procedures (Signed)
Successful US guided paracentesis yielding 3.6 L of serous ascitic fluid. Sample sent to laboratory as requested. EBL: None No immediate post procedural complications.   Ronny Bacon, MD Pager #: [phone]

## 2016-04-16 NOTE — Progress Notes (Signed)
Rounded with MD, patient updated about plan of care, interpreter present in the room. Patient agreed with discharge plan, discharge instruction provided using an interpreter, prescription given to patient, iv removed, tele removed. Patient discharge home

## 2016-04-21 LAB — CULTURE, BODY FLUID-BOTTLE: CULTURE: NO GROWTH

## 2016-04-21 LAB — PATHOLOGIST SMEAR REVIEW

## 2016-04-21 LAB — CULTURE, BODY FLUID W GRAM STAIN -BOTTLE

## 2016-07-03 ENCOUNTER — Encounter: Payer: Self-pay | Admitting: Emergency Medicine

## 2016-07-03 ENCOUNTER — Other Ambulatory Visit: Payer: Self-pay

## 2016-07-03 ENCOUNTER — Inpatient Hospital Stay
Admission: EM | Admit: 2016-07-03 | Discharge: 2016-07-06 | DRG: 291 | Disposition: A | Discharge status: Home / Self Care | Payer: Medicaid Other | Attending: Internal Medicine | Admitting: Internal Medicine

## 2016-07-03 ENCOUNTER — Emergency Department: Payer: Medicaid Other

## 2016-07-03 DIAGNOSIS — D631 Anemia in chronic kidney disease: Secondary | ICD-10-CM | POA: Diagnosis present

## 2016-07-03 DIAGNOSIS — I5023 Acute on chronic systolic (congestive) heart failure: Secondary | ICD-10-CM | POA: Diagnosis present

## 2016-07-03 DIAGNOSIS — N186 End stage renal disease: Secondary | ICD-10-CM

## 2016-07-03 DIAGNOSIS — J811 Chronic pulmonary edema: Secondary | ICD-10-CM | POA: Diagnosis present

## 2016-07-03 DIAGNOSIS — I132 Hypertensive heart and chronic kidney disease with heart failure and with stage 5 chronic kidney disease, or end stage renal disease: Secondary | ICD-10-CM | POA: Diagnosis present

## 2016-07-03 DIAGNOSIS — F17210 Nicotine dependence, cigarettes, uncomplicated: Secondary | ICD-10-CM | POA: Diagnosis present

## 2016-07-03 DIAGNOSIS — R188 Other ascites: Secondary | ICD-10-CM

## 2016-07-03 DIAGNOSIS — N2581 Secondary hyperparathyroidism of renal origin: Secondary | ICD-10-CM | POA: Diagnosis present

## 2016-07-03 DIAGNOSIS — Z79899 Other long term (current) drug therapy: Secondary | ICD-10-CM

## 2016-07-03 DIAGNOSIS — F1020 Alcohol dependence, uncomplicated: Secondary | ICD-10-CM | POA: Diagnosis present

## 2016-07-03 DIAGNOSIS — K709 Alcoholic liver disease, unspecified: Secondary | ICD-10-CM | POA: Diagnosis present

## 2016-07-03 DIAGNOSIS — Z841 Family history of disorders of kidney and ureter: Secondary | ICD-10-CM

## 2016-07-03 DIAGNOSIS — D696 Thrombocytopenia, unspecified: Secondary | ICD-10-CM | POA: Diagnosis present

## 2016-07-03 DIAGNOSIS — Z992 Dependence on renal dialysis: Secondary | ICD-10-CM | POA: Diagnosis not present

## 2016-07-03 DIAGNOSIS — Z833 Family history of diabetes mellitus: Secondary | ICD-10-CM

## 2016-07-03 DIAGNOSIS — J9600 Acute respiratory failure, unspecified whether with hypoxia or hypercapnia: Secondary | ICD-10-CM

## 2016-07-03 DIAGNOSIS — R079 Chest pain, unspecified: Secondary | ICD-10-CM

## 2016-07-03 HISTORY — DX: Chronic pulmonary edema: J81.1

## 2016-07-03 LAB — PHOSPHORUS: Phosphorus: 4.3 mg/dL (ref 2.5–4.6)

## 2016-07-03 LAB — BASIC METABOLIC PANEL
ANION GAP: 16 — AB (ref 5–15)
BUN: 46 mg/dL — ABNORMAL HIGH (ref 6–20)
CALCIUM: 9.6 mg/dL (ref 8.9–10.3)
CO2: 26 mmol/L (ref 22–32)
Chloride: 93 mmol/L — ABNORMAL LOW (ref 101–111)
Creatinine, Ser: 11.26 mg/dL — ABNORMAL HIGH (ref 0.61–1.24)
GFR calc Af Amer: 5 mL/min — ABNORMAL LOW (ref >60)
GFR calc non Af Amer: 5 mL/min — ABNORMAL LOW (ref >60)
GLUCOSE: 128 mg/dL — AB (ref 65–99)
Potassium: 4.7 mmol/L (ref 3.5–5.1)
Sodium: 135 mmol/L (ref 135–145)

## 2016-07-03 LAB — TROPONIN I
Troponin I: 0.05 ng/mL (ref <0.03)
Troponin I: 0.04 ng/mL (ref <0.03)

## 2016-07-03 LAB — CBC WITH DIFFERENTIAL/PLATELET
Basophils Absolute: 0 10*3/uL (ref 0–0.1)
Basophils Relative: 1 %
Eosinophils Absolute: 0.2 10*3/uL (ref 0–0.7)
Eosinophils Relative: 4 %
HEMATOCRIT: 28.2 % — AB (ref 40.0–52.0)
Hemoglobin: 9.7 g/dL — ABNORMAL LOW (ref 13.0–18.0)
LYMPHS PCT: 10 %
Lymphs Abs: 0.6 10*3/uL — ABNORMAL LOW (ref 1.0–3.6)
MCH: 33.8 pg (ref 26.0–34.0)
MCHC: 34.2 g/dL (ref 32.0–36.0)
MCV: 98.8 fL (ref 80.0–100.0)
MONO ABS: 0.4 10*3/uL (ref 0.2–1.0)
Monocytes Relative: 8 %
NEUTROS ABS: 4.5 10*3/uL (ref 1.4–6.5)
Neutrophils Relative %: 77 %
Platelets: 134 10*3/uL — ABNORMAL LOW (ref 150–440)
RBC: 2.86 MIL/uL — ABNORMAL LOW (ref 4.40–5.90)
RDW: 15.3 % — AB (ref 11.5–14.5)
WBC: 5.7 10*3/uL (ref 3.8–10.6)

## 2016-07-03 MED ORDER — ACETAMINOPHEN 325 MG PO TABS
650.0000 mg | ORAL_TABLET | Freq: Four times a day (QID) | ORAL | Status: DC | PRN
  Administered 2016-07-03 – 2016-07-05 (×3): 650 mg via ORAL
  Filled 2016-07-03 (×4): qty 2

## 2016-07-03 MED ORDER — FENTANYL CITRATE (PF) 100 MCG/2ML IJ SOLN
50.0000 ug | INTRAMUSCULAR | Status: DC | PRN
  Administered 2016-07-03 – 2016-07-04 (×3): 50 ug via INTRAVENOUS
  Filled 2016-07-03 (×3): qty 2

## 2016-07-03 MED ORDER — ONDANSETRON HCL 4 MG/2ML IJ SOLN
4.0000 mg | Freq: Four times a day (QID) | INTRAMUSCULAR | Status: DC | PRN
  Administered 2016-07-04 – 2016-07-05 (×2): 4 mg via INTRAVENOUS
  Filled 2016-07-03 (×2): qty 2

## 2016-07-03 MED ORDER — LIDOCAINE HCL (PF) 1 % IJ SOLN
5.0000 mL | INTRAMUSCULAR | Status: DC | PRN
  Filled 2016-07-03: qty 5

## 2016-07-03 MED ORDER — HEPARIN SODIUM (PORCINE) 5000 UNIT/ML IJ SOLN
5000.0000 [IU] | Freq: Three times a day (TID) | INTRAMUSCULAR | Status: DC
  Administered 2016-07-03 – 2016-07-05 (×5): 5000 [IU] via SUBCUTANEOUS
  Filled 2016-07-03 (×5): qty 1

## 2016-07-03 MED ORDER — NITROGLYCERIN 2 % TD OINT
1.0000 [in_us] | TOPICAL_OINTMENT | Freq: Once | TRANSDERMAL | Status: AC
  Administered 2016-07-03: 1 [in_us] via TOPICAL
  Filled 2016-07-03: qty 1

## 2016-07-03 MED ORDER — ALTEPLASE 2 MG IJ SOLR: 2.0000 mg | Freq: Once | INTRAMUSCULAR | Status: DC | PRN

## 2016-07-03 MED ORDER — LIDOCAINE-PRILOCAINE 2.5-2.5 % EX CREA: 1.0000 "application " | TOPICAL_CREAM | CUTANEOUS | Status: DC | PRN

## 2016-07-03 MED ORDER — SODIUM CHLORIDE 0.9% FLUSH
3.0000 mL | Freq: Two times a day (BID) | INTRAVENOUS | Status: DC
  Administered 2016-07-03 – 2016-07-05 (×5): 3 mL via INTRAVENOUS

## 2016-07-03 MED ORDER — HEPARIN SODIUM (PORCINE) 1000 UNIT/ML DIALYSIS
1000.0000 [IU] | INTRAMUSCULAR | Status: DC | PRN
  Filled 2016-07-03: qty 1

## 2016-07-03 MED ORDER — ONDANSETRON HCL 4 MG PO TABS: 4.0000 mg | ORAL_TABLET | Freq: Four times a day (QID) | ORAL | Status: DC | PRN

## 2016-07-03 MED ORDER — PENTAFLUOROPROP-TETRAFLUOROETH EX AERO
1.0000 | INHALATION_SPRAY | CUTANEOUS | Status: DC | PRN
Start: 2016-07-03 — End: 2016-07-06

## 2016-07-03 MED ORDER — ACETAMINOPHEN 650 MG RE SUPP: 650.0000 mg | Freq: Four times a day (QID) | RECTAL | Status: DC | PRN

## 2016-07-03 MED ORDER — METOPROLOL TARTRATE 25 MG PO TABS
25.0000 mg | ORAL_TABLET | Freq: Two times a day (BID) | ORAL | Status: DC
  Administered 2016-07-03 – 2016-07-06 (×6): 25 mg via ORAL
  Filled 2016-07-03 (×6): qty 1

## 2016-07-03 MED ORDER — TRAMADOL HCL 50 MG PO TABS
50.0000 mg | ORAL_TABLET | Freq: Four times a day (QID) | ORAL | Status: DC | PRN
  Administered 2016-07-03 – 2016-07-05 (×6): 50 mg via ORAL
  Filled 2016-07-03 (×6): qty 1

## 2016-07-03 MED ORDER — LANTHANUM CARBONATE 500 MG PO CHEW
500.0000 mg | CHEWABLE_TABLET | Freq: Every day | ORAL | Status: DC
  Administered 2016-07-04 – 2016-07-06 (×3): 500 mg via ORAL
  Filled 2016-07-03 (×3): qty 1

## 2016-07-03 MED ORDER — SODIUM CHLORIDE 0.9 % IV SOLN: 100.0000 mL | INTRAVENOUS | Status: DC | PRN

## 2016-07-03 MED ORDER — LISINOPRIL 5 MG PO TABS
5.0000 mg | ORAL_TABLET | Freq: Every day | ORAL | Status: DC
  Administered 2016-07-03 – 2016-07-06 (×4): 5 mg via ORAL
  Filled 2016-07-03 (×4): qty 1

## 2016-07-03 NOTE — H&P (Addendum)
[place] [facility] [place] NAME: Mario [name]    MR#:  [id number]  DATE OF BIRTH:  1970/09/19  DATE OF ADMISSION:  07/03/2016  PRIMARY CARE PHYSICIAN: [name], MD   REQUESTING/REFERRING PHYSICIAN: Dr Quentin Cornwall  CHIEF COMPLAINT:   Increasing shortness of breath HISTORY OF PRESENT ILLNESS:  Mario [name] a 45 y.o. male with a known history of End stage renal disease due to hypertension on hemodialysis, history of chronic alcoholism with ascites, chronic, thrombocytopenia comes to the emergency room with increasing shortness of breath was found to be in pulmonary edema. Patient was placed on BiPAP. During my evaluation he appeared comfortable with sats 100%. Emergent dialysis was ordered by nephrology. He [name] being admitted in stepdown for acute pulmonary edema in the setting off acute on chronic systolic congestive heart failure EF of 40%.  PAST MEDICAL HISTORY:   Past Medical History:  Diagnosis Date  . Dialysis patient ([place])   . Hypertension   . Renal disorder   . Renal insufficiency     PAST SURGICAL HISTOIRY:   Past Surgical History:  Procedure Laterality Date  . AV FISTULA PLACEMENT      SOCIAL HISTORY:   Social History  Substance Use Topics  . Smoking status: Current Every Day Smoker    Packs/day: 0.25    Types: Cigarettes  . Smokeless tobacco: Never Used  . Alcohol use No    FAMILY HISTORY:   Family History  Problem Relation Age of Onset  . Kidney failure Father   . Diabetes Mother     DRUG ALLERGIES:  No Known Allergies  REVIEW OF SYSTEMS:  Review of Systems  Constitutional: Negative for chills, fever and weight loss.  HENT: Negative for ear discharge, ear pain and nosebleeds.   Eyes: Negative for blurred vision, pain and discharge.  Respiratory: Positive for shortness of breath. Negative for sputum production, wheezing and stridor.   Cardiovascular: Positive for chest pain.  Negative for palpitations, orthopnea and PND.  Gastrointestinal: Negative for abdominal pain, diarrhea, nausea and vomiting.  Genitourinary: Negative for frequency and urgency.  Musculoskeletal: Negative for back pain and joint pain.  Neurological: Positive for weakness. Negative for sensory change, speech change and focal weakness.  Psychiatric/Behavioral: Negative for depression and hallucinations. The patient [name] not nervous/anxious.      MEDICATIONS AT HOME:   Prior to Admission medications   Medication Sig Start Date End Date Taking? Authorizing Provider  lanthanum (FOSRENOL) 500 MG chewable tablet Chew 500 mg by mouth daily.   Yes Historical Provider, MD  lisinopril (PRINIVIL,ZESTRIL) 5 MG tablet Take 1 tablet (5 mg total) by mouth daily. 04/16/16  Yes Henreitta Leber, MD  metoprolol tartrate (LOPRESSOR) 25 MG tablet Take 1 tablet (25 mg total) by mouth 2 (two) times daily. 04/16/16  Yes Henreitta Leber, MD      VITAL SIGNS:  Blood pressure (!) 167/90, pulse 84, temperature 97.5 F (36.4 C), temperature source Oral, resp. rate 19, weight 68 kg (150 lb), SpO2 100 %.  PHYSICAL EXAMINATION:  GENERAL:  46 y.o.-year-old patient lying in the bed with Moderate acute distress.  EYES: Pupils equal, round, reactive to light and accommodation. No scleral icterus. Extraocular muscles intact.  HEENT: Head atraumatic, normocephalic. Oropharynx and nasopharynx clear. JVD plus NECK:  Supple, no jugular venous distention. No thyroid enlargement, no tenderness.  LUNGS: Decreased breath sounds bilaterally, no wheezing, rales,rhonchi or crepitation. No use of accessory muscles of respiration.  CARDIOVASCULAR: S1, S2 normal. No murmurs, rubs, or gallops.  ABDOMEN: Soft, nontender, nondistended. Bowel sounds present. No organomegaly or mass.  EXTREMITIES: ++pedal edema, no cyanosis, or clubbing.  NEUROLOGIC: Cranial nerves II through XII are intact. Muscle strength 5/5 in all extremities. Sensation  intact. Gait not checked.  PSYCHIATRIC: The patient [name] alert and oriented x 3.  SKIN: No obvious rash, lesion, or ulcer.   LABORATORY PANEL:   CBC  Recent Labs Lab 07/03/16 1030  WBC 5.7  HGB 9.7*  HCT 28.2*  PLT 134*   ------------------------------------------------------------------------------------------------------------------  Chemistries   Recent Labs Lab 07/03/16 1030  NA 135  K 4.7  CL 93*  CO2 26  GLUCOSE 128*  BUN 46*  CREATININE 11.26*  CALCIUM 9.6   ------------------------------------------------------------------------------------------------------------------  Cardiac Enzymes  Recent Labs Lab 07/03/16 1030  TROPONINI 0.05*   ------------------------------------------------------------------------------------------------------------------  RADIOLOGY:  Dg Chest Portable 1 View  Result Date: 07/03/2016 CLINICAL DATA:  Shortness breath, some chest pain over the last 3 days, for dialysis today EXAM: PORTABLE CHEST 1 VIEW COMPARISON:  Chest x-ray of 04/11/2016, and CT chest of 01/04/2016 FINDINGS: No active infiltrate or effusion [name] seen. Mild cardiomegaly [name] stable. Prominent azygos vein region [name] unchanged compared to the prior CT of the chest. There [name] no present evidence of pulmonary vascular congestion. No bony abnormality [name] seen. IMPRESSION: Stable cardiomegaly and prominent azygos vein. No active infiltrate or effusion. Electronically Signed   By: Ivar Drape M.D.   On: 07/03/2016 11:03    EKG:  Normal sinus rhythm and LVH  IMPRESSION AND PLAN:   Mario [name]  [name] a 45 y.o. male with a known history of End stage renal disease due to hypertension on hemodialysis, history of chronic alcoholism with ascites, chronic, cytopenia comes to the emergency room with increasing shortness of breath was found to be in pulmonary edema.  *Acute  On Chronic systolic CHF, EF 94% with acute flash pulmonary edema. - Patient currently on BiPAP. Emergent  dialysis ordered by nephrology. He'll be admitted to stepdown. Wean BiPAP off once patient gets ultrafiltration and sats remained stable -Patient does not use oxygen at home.  * Uncontrolled hypertension - cont. Metoprolol - cont. PRN hydralazine, HD and will monitor.   * Chronic alcoholism with h/o Ascites  - no acute issue presently.  No evidence of Hepatic Encephalopathy.  * Thrombocytopenia chronic - stable and will follow counts.  - due to ETOH liver disease.   * Active smoking  - cont. Nicotine patch.   * ESRD on hemodialysis -Nephrology consultation placed   no family in the ER.   All the records are reviewed and case discussed with ED provider. Management plans discussed with the patient, family and they are in agreement.  CODE STATUS: Full  TOTAL  critical TIME TAKING CARE OF THIS PATIENT: 50 minutes.    Mario [name] M.D on 07/03/2016 at 1:54 PM  Between 7am to 6pm - Pager - [phone]  After 6pm go to [email or web address] - password [email or web address] [facility]  Office  [phone]  CC: Primary care physician; [name], MD

## 2016-07-03 NOTE — Progress Notes (Signed)
Post HD assessment  

## 2016-07-03 NOTE — ED Notes (Signed)
Pt requesting Bipap to be taken off, Dr Posey Pronto informed and verbal orders for patient to come off bipap and go to dialysis. This RN called dialysis and they are unavailable to take patient right now. Will continue to monitor patient in ED.

## 2016-07-03 NOTE — Progress Notes (Signed)
HD initiated without issue. Pt sob and with some back pain. Unable to stand on scale in unit for weight. 3.5 hour treatment with 2.5L goal per orders. Continue to monitor. Currently resting comfortably on room air. sats 97%.

## 2016-07-03 NOTE — Progress Notes (Signed)
Pre dialysis  

## 2016-07-03 NOTE — Progress Notes (Signed)
Hemodialysis- No orders for HD presently. Will set up machine for patient.

## 2016-07-03 NOTE — ED Provider Notes (Signed)
[facility] Emergency Department Provider Note    First MD Initiated Contact with Patient 07/03/16 1026     (approximate)  I have reviewed the triage vital signs and the nursing notes.   HISTORY  Chief Complaint Shortness of Breath    HPI Mario [name] is a 45 y.o. 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 Patient's last dialysis was on Tuesday. Scheduled to have dialysis today at noon. Patient's shortness of breath, orthopnea and chest pain became too severe so he called the ambulance to bring him to the ER. Patient was unable to breathe.  Denies any fevers. States is also having muscle aches and all the extremities. States last time he presented like this she was in fluid overload and required emergent dialysis.   Past Medical History:  Diagnosis Date  . Dialysis patient ([place])   . Hypertension   . Renal disorder   . Renal insufficiency    Family History  Problem Relation Age of Onset  . Kidney failure Father   . Diabetes Mother    Past Surgical History:  Procedure Laterality Date  . AV FISTULA PLACEMENT     Patient Active Problem List   Diagnosis Date Noted  . Fluid overload 04/12/2016  . Chest pain 04/11/2016  . Scrotal infection 01/25/2015  . ESRD (end stage renal disease) ([place]) 01/25/2015      Prior to Admission medications   Medication Sig Start Date End Date Taking? Authorizing Provider  HYDROcodone-acetaminophen (NORCO/VICODIN) 5-325 MG per tablet Take 1-2 tablets by mouth every 4 (four) hours as needed for moderate pain. 01/28/15   Fritzi Mandes, MD  lanthanum (FOSRENOL) 1000 MG chewable tablet Chew 1,500 mg by mouth daily.     Historical Provider, MD  lisinopril (PRINIVIL,ZESTRIL) 5 MG tablet Take 1 tablet (5 mg total) by mouth daily. 04/16/16   Henreitta Leber, MD  metoprolol tartrate (LOPRESSOR) 25 MG tablet Take 1 tablet (25 mg  total) by mouth 2 (two) times daily. 04/16/16   Henreitta Leber, MD  sevelamer carbonate (RENVELA) 800 MG tablet Take 2,400 mg by mouth 3 (three) times daily with meals.     Historical Provider, MD    Allergies Patient has no known allergies.    Social History Social History  Substance Use Topics  . Smoking status: Current Every Day Smoker    Packs/day: 0.25    Types: Cigarettes  . Smokeless tobacco: Never Used  . Alcohol use No    Review of Systems Patient denies headaches, rhinorrhea, blurry vision, numbness, shortness of breath, chest pain, edema, cough, abdominal pain, nausea, vomiting, diarrhea, dysuria, fevers, rashes or hallucinations unless otherwise stated above in HPI. ____________________________________________   PHYSICAL EXAM:  VITAL SIGNS: Vitals:   07/03/16 1009  BP: (!) 178/81  Pulse: 85  Resp: 18  Temp: 97.5 F (36.4 C)   Constitutional: Alert and oriented. Moderate respiratory distress Eyes: Conjunctivae are normal. PERRL. EOMI. Head: Atraumatic. Nose: No congestion/rhinnorhea. Mouth/Throat: Mucous membranes are moist.  Oropharynx non-erythematous. Neck: No stridor. Painless ROM. No cervical spine tenderness to palpation Hematological/Lymphatic/Immunilogical: No cervical lymphadenopathy. Cardiovascular: Normal rate, regular rhythm. Grossly normal heart sounds.  Good peripheral circulation. Respiratory: Tachypnea, speaking in shortened sentences, use of accessory muscles Gastrointestinal: Soft and nontender. No distention. No abdominal bruits. No CVA tenderness.  Musculoskeletal: No lower extremity tenderness nor edema.  No joint effusions. Neurologic:  Normal speech and language.  No gross focal neurologic deficits are appreciated. No gait instability. Skin:  Skin is warm, dry and intact. No rash noted. Psychiatric: Mood and affect are normal. Speech and behavior are normal.  ____________________________________________   LABS (all labs ordered are  listed, but only abnormal results are displayed)  Results for orders placed or performed during the hospital encounter of 07/03/16 (from the past 24 hour(s))  Basic metabolic panel     Status: Abnormal   Collection Time: 07/03/16 10:30 AM  Result Value Ref Range   Sodium 135 135 - 145 mmol/L   Potassium 4.7 3.5 - 5.1 mmol/L   Chloride 93 (L) 101 - 111 mmol/L   CO2 26 22 - 32 mmol/L   Glucose, Bld 128 (H) 65 - 99 mg/dL   BUN 46 (H) 6 - 20 mg/dL   Creatinine, Ser 11.26 (H) 0.61 - 1.24 mg/dL   Calcium 9.6 8.9 - 10.3 mg/dL   GFR calc non Af Amer 5 (L) >60 mL/min   GFR calc Af Amer 5 (L) >60 mL/min   Anion gap 16 (H) 5 - 15  CBC with Differential/Platelet     Status: Abnormal   Collection Time: 07/03/16 10:30 AM  Result Value Ref Range   WBC 5.7 3.8 - 10.6 K/uL   RBC 2.86 (L) 4.40 - 5.90 MIL/uL   Hemoglobin 9.7 (L) 13.0 - 18.0 g/dL   HCT 28.2 (L) 40.0 - 52.0 %   MCV 98.8 80.0 - 100.0 fL   MCH 33.8 26.0 - 34.0 pg   MCHC 34.2 32.0 - 36.0 g/dL   RDW 15.3 (H) 11.5 - 14.5 %   Platelets 134 (L) 150 - 440 K/uL   Neutrophils Relative % 77 %   Neutro Abs 4.5 1.4 - 6.5 K/uL   Lymphocytes Relative 10 %   Lymphs Abs 0.6 (L) 1.0 - 3.6 K/uL   Monocytes Relative 8 %   Monocytes Absolute 0.4 0.2 - 1.0 K/uL   Eosinophils Relative 4 %   Eosinophils Absolute 0.2 0 - 0.7 K/uL   Basophils Relative 1 %   Basophils Absolute 0.0 0 - 0.1 K/uL  Troponin I     Status: Abnormal   Collection Time: 07/03/16 10:30 AM  Result Value Ref Range   Troponin I 0.05 (HH) <0.03 ng/mL   ____________________________________________  EKG My review and personal interpretation at Time: 10:08   Indication: sob  Rate: 85 Rhythm: sinus  Axis: normal Other: non specific st changes, no acute ischemia, normal intervals ____________________________________________  RADIOLOGY  I personally reviewed all radiographic images ordered to evaluate for the above acute complaints and reviewed radiology reports and findings.   These findings were personally discussed with the patient.  Please see medical record for radiology report.  ____________________________________________   PROCEDURES  Procedure(s) performed: none Procedures    Critical Care performed: yes CRITICAL CARE Performed by: Merlyn Lot   Total critical care time: 45 minutes  Critical care time was exclusive of separately billable procedures and treating other patients.  Critical care was necessary to treat or prevent imminent or life-threatening deterioration.  Critical care was time spent personally by me on the following activities: development of treatment plan with patient and/or surrogate as well as nursing, discussions with consultants, evaluation of patient's response to treatment, examination of patient, obtaining history from patient or surrogate, ordering and performing treatments and interventions, ordering and review of laboratory studies, ordering and review of radiographic studies, pulse oximetry and re-evaluation of patient's condition.  ____________________________________________  INITIAL IMPRESSION / ASSESSMENT AND PLAN / ED COURSE  Pertinent labs & imaging results that were available during my care of the patient were reviewed by me and considered in my medical decision making (see chart for details).  DDX: Asthma, copd, CHF, pna, ptx, malignancy, Pe, anemia   Mario [name] is a 45 y.o. who presents to the ED with complex past medical history including end-stage renal disease on dialysis presenting with acute respiratory distress and chest pain. Presentation concerning for volume overload given diminished breath sounds market tachypnea upon arrival. Patient placed emergently on BiPAP. There is not immediately hypoxic patient is a. Moderate distress. EKG shows no evidence of acute ischemia. We will order chest x-ray to evaluate for any pneumonia, pneumothorax or significant edema. Will check electrolytes.  We'll touch base with nephrology  Clinical Course as of Jul 03 1549  Thu Jul 03, 2016  1138 Chest x-ray does not show any evidence of significant pulmonary edema but his exam and history is more consistent. I spoke with Dr. Holley Raring regarding need for urgent hemodialysis. Patient placed on BiPAP due to moderate respiratory distress. EKG without any evidence of acute ischemia. Potassium is normal. We'll continue to monitor. Spoke with Dr. Posey Pronto who kindly agrees to admit patient for further evaluation and management.  [PR]    Clinical Course User Index [PR] Merlyn Lot, MD     ____________________________________________   FINAL CLINICAL IMPRESSION(S) / ED DIAGNOSES  Final diagnoses:  Acute respiratory failure, unspecified whether with hypoxia or hypercapnia ([place])  ESRD on dialysis ([place])  Chest pain, unspecified type      NEW MEDICATIONS STARTED DURING THIS VISIT:  New Prescriptions   No medications on file     Note:  This document was prepared using Dragon voice recognition software and may include unintentional dictation errors.    Merlyn Lot, MD 07/03/16 [phone]

## 2016-07-03 NOTE — ED Triage Notes (Signed)
Pt with shortness of breath for three days. Pt was supposed to have dialysis today but came here instead.  Interpreter in triage. Also c/o low back pain.

## 2016-07-03 NOTE — Progress Notes (Signed)
Pre-Dialysis assessment. 

## 2016-07-03 NOTE — Progress Notes (Signed)
HD completed without issue. 2.6L UF. Tolerated well. Remains 100% on RA. Denies SOB. Sill with some pain in back.

## 2016-07-03 NOTE — ED Notes (Signed)
Patient went to dialysis. Report given to [facility].

## 2016-07-04 LAB — HEPATITIS B SURFACE ANTIGEN: HEP B S AG: NEGATIVE

## 2016-07-04 LAB — TROPONIN I
TROPONIN I: 0.04 ng/mL — AB (ref <0.03)
TROPONIN I: 0.03 ng/mL — AB (ref <0.03)

## 2016-07-04 LAB — PARATHYROID HORMONE, INTACT (NO CA): PTH: 1080 pg/mL — ABNORMAL HIGH (ref 15–65)

## 2016-07-04 MED ORDER — INFLUENZA VAC SPLIT QUAD 0.5 ML IM SUSY
0.5000 mL | PREFILLED_SYRINGE | INTRAMUSCULAR | Status: DC
  Filled 2016-07-04: qty 0.5

## 2016-07-04 MED ORDER — MORPHINE SULFATE (PF) 4 MG/ML IV SOLN
2.0000 mg | Freq: Once | INTRAVENOUS | Status: AC
  Administered 2016-07-04: 2 mg via INTRAVENOUS
  Filled 2016-07-04: qty 1

## 2016-07-04 NOTE — Care Management (Signed)
Patient known to this CM.  Presents in acute respiratory failure due to fluid overload, initially requiring bipap.  He continues with compliance issues with his dialysis.  His [facility] nephrologist are investigating whether patient's care can be transferred to a dialysis clinic closer to his home so issues with transportation can be eliminated as the barrier to his compliance with his dialysis treatments. He has to rely on friends to take him to dialysis clinic on their way to and from work. Patient is undocumented and has no payor.  Contacted [facility] to assess whether he was compliant with the application process.  This CM has spoken with [facility] on previous admits and informed that since patient is undocumented, he is not able to receive transportation.  Patient also with known etoh abuse.Notified Alda Lea at [facility] of admission.

## 2016-07-04 NOTE — Progress Notes (Signed)
Arrival Method: via stretcher with ED NT Mental Orientation:?A&O Telemetry:?[id number], verified by Corine Shelter, RN Skin:?Intact, Verified by Corine Shelter, RN IV:?2x 20g right forearm Pain: Pain noted in the back and bilateral legs, see MAR Tubes:NO attachments Safety Measures: Safety Fall Prevention Plan has been given, discussed, non skid socks in place, patient has been orientated to the room, unit &staff.  Family: No family present  Orders have been reviewed, call bell within reach. Will continue to monitor.  Carrie Mew, RN

## 2016-07-04 NOTE — Progress Notes (Signed)
[place] [facility] [place] NAME: Mario [name]    MR#:  [id number]  DATE OF BIRTH:  [birth year]  SUBJECTIVE: Seen at the bedside. Patient complains of generalized body pains. No shortness of breath. Spoke with [place] interpreter.looks unkempt.denies any heavy alcohol abuse but not a reliable historian.  CHIEF COMPLAINT:   Chief Complaint  Patient presents with  . Shortness of Breath    REVIEW OF SYSTEMS:   ROS CONSTITUTIONAL: No fever, fatigue or weakness.  EYES: No blurred or double vision.  EARS, NOSE, AND THROAT: No tinnitus or ear pain.  RESPIRATORY: No cough, shortness of breath, wheezing or hemoptysis.  CARDIOVASCULAR: No chest pain, orthopnea, edema.  GASTROINTESTINAL: No nausea, vomiting, diarrhea or abdominal pain.  GENITOURINARY: No dysuria, hematuria.  ENDOCRINE: No polyuria, nocturia,  HEMATOLOGY: No anemia, easy bruising or bleeding SKIN: No rash or lesion. MUSCULOSKELETAL: No joint pain or arthritis.   NEUROLOGIC: No tingling, numbness, weakness.  PSYCHIATRY: No anxiety or depression.   DRUG ALLERGIES:  No Known Allergies  VITALS:  Blood pressure (!) 154/85, pulse 78, temperature 98.3 F (36.8 C), temperature source Oral, resp. rate 20, weight 75.5 kg (166 lb 7.2 oz), SpO2 98 %.  PHYSICAL EXAMINATION:  GENERAL:  45 y.o.-year-old patient lying in the bed with no acute distress.  EYES: Pupils equal, round, reactive to light and accommodation. No scleral icterus. Extraocular muscles intact.  HEENT: Head atraumatic, normocephalic. Oropharynx and nasopharynx clear.  NECK:  Supple, no jugular venous distention. No thyroid enlargement, no tenderness.  LUNGS: Normal breath sounds bilaterally, no wheezing, rales,rhonchi or crepitation. No use of accessory muscles of respiration.  CARDIOVASCULAR: S1, S2 normal. No murmurs, rubs, or gallops.  ABDOMEN: Soft, nontender, nondistended. Bowel sounds present. No organomegaly  or mass.  EXTREMITIES: No pedal edema, cyanosis, or clubbing.  NEUROLOGIC: Cranial nerves II through XII are intact. Muscle strength 5/5 in all extremities. Sensation intact. Gait not checked.  PSYCHIATRIC: The patient is alert and oriented x 3.  SKIN: No obvious rash, lesion, or ulcer.    LABORATORY PANEL:   CBC  Recent Labs Lab 07/03/16 1030  WBC 5.7  HGB 9.7*  HCT 28.2*  PLT 134*   ------------------------------------------------------------------------------------------------------------------  Chemistries   Recent Labs Lab 07/03/16 1030  NA 135  K 4.7  CL 93*  CO2 26  GLUCOSE 128*  BUN 46*  CREATININE 11.26*  CALCIUM 9.6   ------------------------------------------------------------------------------------------------------------------  Cardiac Enzymes  Recent Labs Lab 07/04/16 0241  TROPONINI 0.04*   ------------------------------------------------------------------------------------------------------------------  RADIOLOGY:  Dg Chest Portable 1 View  Result Date: 07/03/2016 CLINICAL DATA:  Shortness breath, some chest pain over the last 3 days, for dialysis today EXAM: PORTABLE CHEST 1 VIEW COMPARISON:  Chest x-ray of 04/11/2016, and CT chest of 01/04/2016 FINDINGS: No active infiltrate or effusion is seen. Mild cardiomegaly is stable. Prominent azygos vein region is unchanged compared to the prior CT of the chest. There is no present evidence of pulmonary vascular congestion. No bony abnormality is seen. IMPRESSION: Stable cardiomegaly and prominent azygos vein. No active infiltrate or effusion. Electronically Signed   By: Ivar Drape M.D.   On: 07/03/2016 11:03    EKG:   Orders placed or performed during the hospital encounter of 07/03/16  . ED EKG  . ED EKG    ASSESSMENT AND PLAN:    #1 acute on chronic systolic heart failure secondary to fluid overload in the contest of ESRD. Patient received urgent hemodialysis yesterday. He feels  better  today. Nephrology is following. Was on BiPAP transiently yesterday. Had the patient is on Levemir normal saturation 100%. #2. Malignant hypertension: BP still high. I just the blood pressure medications  #3.alcohol , possible alcohol withdrawal symptoms now: Continue withdrawal protocol. Generalized body pains use  Morphine,, discontinue fentanyl.  Possible discharge tomorrow. Discussed this with help [place] interpreter.   All the records are reviewed and case discussed with Care Management/Social Workerr. Management plans discussed with the patient, family and they are in agreement.  CODE STATUS: full  TOTAL TIME TAKING CARE OF THIS PATIENT:45 minutes.   POSSIBLE D/C IN 1-2DAYS, DEPENDING ON CLINICAL CONDITION.   Epifanio Lesches M.D on 07/04/2016 at 9:20 AM  Between 7am to 6pm - Pager - [phone]  After 6pm go to [email or web address] - password [email or web address] [facility]  Office  [phone]  CC: Primary care physician; [name], MD   Note: This dictation was prepared with Dragon dictation along with smaller phrase technology. Any transcriptional errors that result from this process are unintentional.

## 2016-07-04 NOTE — Progress Notes (Signed)
[facility]  ROUNDING NOTE   Subjective:  Patient well known to from prior admissions. Recently he's had some transportation issues with going to dialysis. Therefore he has not been fully adherent with his dialysis times. Yesterday he came with significant shortness of breath and respiratory failure. He required BiPAP. Patient underwent urgent dialysis yesterday. His respiratory status has significantly improved this a.m.   Objective:  Vital signs in last 24 hours:  Temp:  [97.5 F (36.4 C)-98.3 F (36.8 C)] 98.3 F (36.8 C) (12/01 0805) Pulse Rate:  [74-98] 78 (12/01 0805) Resp:  [17-30] 20 (12/01 0805) BP: (144-179)/(80-97) 154/85 (12/01 0805) SpO2:  [97 %-100 %] 98 % (12/01 0805) Weight:  [68 kg (150 lb)-75.5 kg (166 lb 7.2 oz)] 75.5 kg (166 lb 7.2 oz) (11/30 1853)  Weight change:  Filed Weights   07/03/16 1010 07/03/16 1853  Weight: 68 kg (150 lb) 75.5 kg (166 lb 7.2 oz)    Intake/Output: I/O last 3 completed shifts: In: 500 [P.O.:500] Out: 2694 [Other:2694]   Intake/Output this shift:  No intake/output data recorded.  Physical Exam: General: No acute distress  Head: Normocephalic, atraumatic. Moist oral mucosal membranes  Eyes: Anicteric  Neck: Supple, trachea midline  Lungs:  Clear to auscultation, normal effort  Heart: S1S2 no rubs  Abdomen:  Soft, nontender, mild distension  Extremities:  peripheral edema.  Neurologic: Nonfocal, moving all four extremities  Skin: No lesions  Access: LUE AVF    Basic Metabolic Panel:  Recent Labs Lab 07/03/16 1030 07/03/16 1642  NA 135  --   K 4.7  --   CL 93*  --   CO2 26  --   GLUCOSE 128*  --   BUN 46*  --   CREATININE 11.26*  --   CALCIUM 9.6  --   PHOS  --  4.3    Liver Function Tests: No results for input(s): AST, ALT, ALKPHOS, BILITOT, PROT, ALBUMIN in the last 168 hours. No results for input(s): LIPASE, AMYLASE in the last 168 hours. No results for input(s): AMMONIA in the last 168  hours.  CBC:  Recent Labs Lab 07/03/16 1030  WBC 5.7  NEUTROABS 4.5  HGB 9.7*  HCT 28.2*  MCV 98.8  PLT 134*    Cardiac Enzymes:  Recent Labs Lab 07/03/16 1030 07/03/16 2055 07/04/16 0241  TROPONINI 0.05* 0.04* 0.04*    BNP: Invalid input(s): POCBNP  CBG: No results for input(s): GLUCAP in the last 168 hours.  Microbiology: Results for orders placed or performed during the hospital encounter of 04/11/16  MRSA PCR Screening     Status: None   Collection Time: 04/11/16  9:00 PM  Result Value Ref Range Status   MRSA by PCR NEGATIVE NEGATIVE Final    Comment:        The GeneXpert MRSA Assay (FDA approved for NASAL specimens only), is one component of a comprehensive MRSA colonization surveillance program. It is not intended to diagnose MRSA infection nor to guide or monitor treatment for MRSA infections.   Culture, body fluid-bottle     Status: None   Collection Time: 04/16/16  9:35 AM  Result Value Ref Range Status   Specimen Description FLUID PERITONEAL  Final   Special Requests NONE  Final   Culture   Final    NO GROWTH 5 DAYS Performed at [facility]    Report Status 04/21/2016 FINAL  Final  Gram stain     Status: None   Collection Time: 04/16/16  9:35 AM  Result Value Ref Range Status   Specimen Description FLUID PERITONEAL  Final   Special Requests NONE  Final   Gram Stain   Final    FEW WBC PRESENT, PREDOMINANTLY PMN NO ORGANISMS SEEN Performed at [facility]    Report Status 04/16/2016 FINAL  Final    Coagulation Studies: No results for input(s): LABPROT, INR in the last 72 hours.  Urinalysis: No results for input(s): COLORURINE, LABSPEC, PHURINE, GLUCOSEU, HGBUR, BILIRUBINUR, KETONESUR, PROTEINUR, UROBILINOGEN, NITRITE, LEUKOCYTESUR in the last 72 hours.  Invalid input(s): APPERANCEUR    Imaging: Dg Chest Portable 1 View  Result Date: 07/03/2016 CLINICAL DATA:  Shortness breath, some chest pain over the last 3  days, for dialysis today EXAM: PORTABLE CHEST 1 VIEW COMPARISON:  Chest x-ray of 04/11/2016, and CT chest of 01/04/2016 FINDINGS: No active infiltrate or effusion is seen. Mild cardiomegaly is stable. Prominent azygos vein region is unchanged compared to the prior CT of the chest. There is no present evidence of pulmonary vascular congestion. No bony abnormality is seen. IMPRESSION: Stable cardiomegaly and prominent azygos vein. No active infiltrate or effusion. Electronically Signed   By: Ivar Drape M.D.   On: 07/03/2016 11:03     Medications:    . heparin  5,000 Units Subcutaneous Q8H  . [START ON 07/05/2016] Influenza vac split quadrivalent PF  0.5 mL Intramuscular Tomorrow-1000  . lanthanum  500 mg Oral Q breakfast  . lisinopril  5 mg Oral Daily  . metoprolol tartrate  25 mg Oral BID  . sodium chloride flush  3 mL Intravenous Q12H   sodium chloride, sodium chloride, acetaminophen **OR** acetaminophen, alteplase, heparin, lidocaine (PF), lidocaine-prilocaine, ondansetron **OR** ondansetron (ZOFRAN) IV, pentafluoroprop-tetrafluoroeth, traMADol  Assessment/ Plan:  45 y.o.  Hispanic male with a PMHX of end-stage renal disease on hemodialysis Tuesday, Thursday, Saturday, hypertension, anemia chronic kidney disease, secondary hyperparathyroidism, left upper extremity AV fistula,   TTS [facility] [place].   1. ESRD on HD TTS. Patient has had transportation issues recently and therefore has not been fully adherent with his dialysis sessions. He presented this time with acute respiratory failure requiring BiPAP. He underwent hemodialysis yesterday which she tolerated well. He is breathing much more comfortably today. No urgent indication for dialysis today. We will plan for hemodialysis again if here tomorrow. We have also requested care management consultation to further explore his transportation issues in regards to hemodialysis.   2. Anemia of chronic kidney disease. Hemoglobin  currently 9.7.  Continue mircera as an outpatient.   3. Secondary hyperparathyroidism. Continue Fosrenol. Phosphorus yesterday was 4.3 and acceptable.  4. Hypertension:  Blood pressure currently 154/85.  Continue lisinopril and metoprolol.  5.  Acute respiratory failure:  Now off bipap, improved with dialysis and UF as well.     LOS: 1 Saphia Vanderford 12/1/20179:05 AM

## 2016-07-05 ENCOUNTER — Inpatient Hospital Stay: Payer: Medicaid Other

## 2016-07-05 LAB — PROTIME-INR
INR: 1.39
Prothrombin Time: 17.2 seconds — ABNORMAL HIGH (ref 11.4–15.2)

## 2016-07-05 LAB — PROTEIN, BODY FLUID: Total protein, fluid: 3.9 g/dL

## 2016-07-05 LAB — ALBUMIN, FLUID (OTHER): Albumin, Fluid: 2 g/dL

## 2016-07-05 LAB — BODY FLUID CELL COUNT WITH DIFFERENTIAL
EOS FL: 0 %
LYMPHS FL: 76 %
MONOCYTE-MACROPHAGE-SEROUS FLUID: 7 %
NEUTROPHIL FLUID: 17 %
Other Cells, Fluid: 0 %
WBC FLUID: 1145 uL

## 2016-07-05 LAB — LACTATE DEHYDROGENASE, PLEURAL OR PERITONEAL FLUID: LD FL: 115 U/L — AB (ref 3–23)

## 2016-07-05 LAB — GLUCOSE, SEROUS FLUID: Glucose, Fluid: 91 mg/dL

## 2016-07-05 LAB — APTT: APTT: 39 s — AB (ref 24–36)

## 2016-07-05 LAB — PHOSPHORUS: PHOSPHORUS: 7.4 mg/dL — AB (ref 2.5–4.6)

## 2016-07-05 NOTE — Progress Notes (Signed)
Dialysis complete

## 2016-07-05 NOTE — Progress Notes (Signed)
Pre hd assessment  

## 2016-07-05 NOTE — Progress Notes (Signed)
DR Tressia Miners was made aware of pt 7 beat of v-tach , pt already started on metoprolol  This am , will continue to monitor

## 2016-07-05 NOTE — Progress Notes (Signed)
[facility]  ROUNDING NOTE   Subjective:  Patient to have abdominal ultrasound today to evaluate ascites. In addition the patient is due for his regularly scheduled hemodialysis today.  Objective:  Vital signs in last 24 hours:  Temp:  [97.9 F (36.6 C)-99.5 F (37.5 C)] 98.7 F (37.1 C) (12/02 1234) Pulse Rate:  [71-78] 71 (12/02 1234) Resp:  [18] 18 (12/02 1234) BP: (134-156)/(75-88) 134/75 (12/02 1234) SpO2:  [97 %-99 %] 99 % (12/02 1234)  Weight change:  Filed Weights   07/03/16 1010 07/03/16 1853  Weight: 68 kg (150 lb) 75.5 kg (166 lb 7.2 oz)    Intake/Output: I/O last 3 completed shifts: In: 160 [P.O.:860; I.V.:3] Out: 0    Intake/Output this shift:  No intake/output data recorded.  Physical Exam: General: No acute distress  Head: Normocephalic, atraumatic. Moist oral mucosal membranes  Eyes: Anicteric  Neck: Supple, trachea midline  Lungs:  Clear to auscultation, normal effort  Heart: S1S2 no rubs  Abdomen:  Soft, nontender, mild distension  Extremities: No peripheral edema.  Neurologic: Nonfocal, moving all four extremities  Skin: No lesions  Access: LUE AVF    Basic Metabolic Panel:  Recent Labs Lab 07/03/16 1030 07/03/16 1642 07/05/16 0918  NA 135  --   --   K 4.7  --   --   CL 93*  --   --   CO2 26  --   --   GLUCOSE 128*  --   --   BUN 46*  --   --   CREATININE 11.26*  --   --   CALCIUM 9.6  --   --   PHOS  --  4.3 7.4*    Liver Function Tests: No results for input(s): AST, ALT, ALKPHOS, BILITOT, PROT, ALBUMIN in the last 168 hours. No results for input(s): LIPASE, AMYLASE in the last 168 hours. No results for input(s): AMMONIA in the last 168 hours.  CBC:  Recent Labs Lab 07/03/16 1030  WBC 5.7  NEUTROABS 4.5  HGB 9.7*  HCT 28.2*  MCV 98.8  PLT 134*    Cardiac Enzymes:  Recent Labs Lab 07/03/16 1030 07/03/16 2055 07/04/16 0241 07/04/16 0840  TROPONINI 0.05* 0.04* 0.04* 0.03*    BNP: Invalid  input(s): POCBNP  CBG: No results for input(s): GLUCAP in the last 168 hours.  Microbiology: Results for orders placed or performed during the hospital encounter of 04/11/16  MRSA PCR Screening     Status: None   Collection Time: 04/11/16  9:00 PM  Result Value Ref Range Status   MRSA by PCR NEGATIVE NEGATIVE Final    Comment:        The GeneXpert MRSA Assay (FDA approved for NASAL specimens only), is one component of a comprehensive MRSA colonization surveillance program. It is not intended to diagnose MRSA infection nor to guide or monitor treatment for MRSA infections.   Culture, body fluid-bottle     Status: None   Collection Time: 04/16/16  9:35 AM  Result Value Ref Range Status   Specimen Description FLUID PERITONEAL  Final   Special Requests NONE  Final   Culture   Final    NO GROWTH 5 DAYS Performed at [facility]    Report Status 04/21/2016 FINAL  Final  Gram stain     Status: None   Collection Time: 04/16/16  9:35 AM  Result Value Ref Range Status   Specimen Description FLUID PERITONEAL  Final   Special Requests NONE  Final   Gram Stain   Final    FEW WBC PRESENT, PREDOMINANTLY PMN NO ORGANISMS SEEN Performed at [facility]    Report Status 04/16/2016 FINAL  Final    Coagulation Studies:  Recent Labs  07/05/16 0918  LABPROT 17.2*  INR 1.39    Urinalysis: No results for input(s): COLORURINE, LABSPEC, PHURINE, GLUCOSEU, HGBUR, BILIRUBINUR, KETONESUR, PROTEINUR, UROBILINOGEN, NITRITE, LEUKOCYTESUR in the last 72 hours.  Invalid input(s): APPERANCEUR    Imaging: No results found.   Medications:    . Influenza vac split quadrivalent PF  0.5 mL Intramuscular Tomorrow-1000  . lanthanum  500 mg Oral Q breakfast  . lisinopril  5 mg Oral Daily  . metoprolol tartrate  25 mg Oral BID  . sodium chloride flush  3 mL Intravenous Q12H   sodium chloride, sodium chloride, acetaminophen **OR** acetaminophen, alteplase, heparin, lidocaine  (PF), lidocaine-prilocaine, ondansetron **OR** ondansetron (ZOFRAN) IV, pentafluoroprop-tetrafluoroeth, traMADol  Assessment/ Plan:  45 y.o.  Hispanic male with a PMHX of end-stage renal disease on hemodialysis Tuesday, Thursday, Saturday, hypertension, anemia chronic kidney disease, secondary hyperparathyroidism, left upper extremity AV fistula,   TTS [facility] [place].   1. ESRD on HD TTS. Patient has had transportation issues recently and therefore has not been fully adherent with his dialysis sessions. He presented this time with acute respiratory failure requiring BiPAP.  - Patient due for hemodialysis today. Orders have been prepared.  2. Anemia of chronic kidney disease. Hemoglobin currently 9.7.  Continue mircera as an outpatient.   3. Secondary hyperparathyroidism. Phosphorus appears to be fluctuating. Today the patient's phosphorus was found to be 7.4. Continue Fosrenol.  4. Hypertension:  Blood pressure 134/75 today..  Continue lisinopril and metoprolol.  5.  Acute respiratory failure:  Resolved with ultrafiltration with dialysis and prior use of BiPAP. Patient doing well at the moment.     LOS: 2 Racquel Arkin 12/2/201712:57 PM

## 2016-07-05 NOTE — Progress Notes (Signed)
Start of hd 

## 2016-07-05 NOTE — Progress Notes (Signed)
Dr Tressia Miners was made aware of pt tachy HR,no new order , will see pt in a few minute , will continue to monitor

## 2016-07-05 NOTE — Progress Notes (Signed)
Post dialysis 

## 2016-07-05 NOTE — Progress Notes (Signed)
Mario [name] at [place] NAME: Mario [name]    MR#:  [id number]  DATE OF BIRTH:  09-27-1970  SUBJECTIVE:  CHIEF COMPLAINT:   Chief Complaint  Patient presents with  . Shortness of Breath   - abdomen is distended, complains of pain All night long -Dry heaving with nausea and vomiting morning. -Due for dialysis today.  REVIEW OF SYSTEMS:  Review of Systems  Constitutional: Negative for chills, fever and malaise/fatigue.  HENT: Negative for congestion, ear discharge, hearing loss and nosebleeds.   Eyes: Negative for blurred vision.  Respiratory: Negative for cough, shortness of breath and wheezing.   Cardiovascular: Negative for chest pain, palpitations and leg swelling.  Gastrointestinal: Positive for abdominal pain, nausea and vomiting. Negative for constipation and diarrhea.  Genitourinary: Negative for dysuria.  Musculoskeletal: Negative for myalgias.  Neurological: Negative for dizziness, sensory change, speech change, focal weakness, seizures and headaches.  Psychiatric/Behavioral: Negative for depression.    DRUG ALLERGIES:  No Known Allergies  VITALS:  Blood pressure (!) 156/82, pulse 76, temperature 97.9 F (36.6 C), temperature source Oral, resp. rate 18, weight 75.5 kg (166 lb 7.2 oz), SpO2 97 %.  PHYSICAL EXAMINATION:  Physical Exam  GENERAL:  45 y.o.-year-old patient lying in the bed with no acute distress.  EYES: Pupils equal, round, reactive to light and accommodation. No scleral icterus. Extraocular muscles intact.  HEENT: Head atraumatic, normocephalic. Oropharynx and nasopharynx clear.  NECK:  Supple, no jugular venous distention. No thyroid enlargement, no tenderness.  LUNGS: Normal breath sounds bilaterally, no wheezing, rales,rhonchi or crepitation. No use of accessory muscles of respiration. Decreased bibasilar breath sounds CARDIOVASCULAR: S1, S2 normal. No rubs, or gallops. 2/6 systolic murmur  present. ABDOMEN: Soft, nontender, distended. Bowel sounds present. No organomegaly or mass.  EXTREMITIES: No pedal edema, cyanosis, or clubbing. Left arm fistula with good thrill present. NEUROLOGIC: Cranial nerves II through XII are intact. Muscle strength 5/5 in all extremities. Sensation intact. Gait not checked.  PSYCHIATRIC: The patient is alert and oriented x 3.  SKIN: No obvious rash, lesion, or ulcer.    LABORATORY PANEL:   CBC  Recent Labs Lab 07/03/16 1030  WBC 5.7  HGB 9.7*  HCT 28.2*  PLT 134*   ------------------------------------------------------------------------------------------------------------------  Chemistries   Recent Labs Lab 07/03/16 1030  NA 135  K 4.7  CL 93*  CO2 26  GLUCOSE 128*  BUN 46*  CREATININE 11.26*  CALCIUM 9.6   ------------------------------------------------------------------------------------------------------------------  Cardiac Enzymes  Recent Labs Lab 07/04/16 0840  TROPONINI 0.03*   ------------------------------------------------------------------------------------------------------------------  RADIOLOGY:  Dg Chest Portable 1 View  Result Date: 07/03/2016 CLINICAL DATA:  Shortness breath, some chest pain over the last 3 days, for dialysis today EXAM: PORTABLE CHEST 1 VIEW COMPARISON:  Chest x-ray of 04/11/2016, and CT chest of 01/04/2016 FINDINGS: No active infiltrate or effusion is seen. Mild cardiomegaly is stable. Prominent azygos vein region is unchanged compared to the prior CT of the chest. There is no present evidence of pulmonary vascular congestion. No bony abnormality is seen. IMPRESSION: Stable cardiomegaly and prominent azygos vein. No active infiltrate or effusion. Electronically Signed   By: Ivar Drape M.D.   On: 07/03/2016 11:03    EKG:   Orders placed or performed during the hospital encounter of 07/03/16  . ED EKG  . ED EKG  . EKG    ASSESSMENT AND PLAN:   Mario [name]  is a 45  y.o. male with a known  history of End stage renal disease due to hypertension on hemodialysis, history of chronic alcoholism with ascites, chronic, cytopenia comes to the emergency room with increasing shortness of breath was found to be in pulmonary edema.  *Acute On Chronic systolic CHF, EF 26% with acute flash pulmonary edema. -Required BiPAP and admission, currently off oxygen and on room air. -Improved breathing with dialysis. Does not make much urine at baseline. -Continue with dialysis. Advised low-sodium diet and fluid restriction.  * Uncontrolled hypertension - improved after dialysis. -Continue metoprolol and lisinopril. Adjust medications as they're on low dose  * Chronic alcoholism with h/o Ascites - worsening abdominal distention this morning associated with pain and nausea and vomiting. -Changed to clear liquid diet. Last thoracentesis in September and almost 3.6 L fluid taken out. -Ultrasound guided paracentesis again ordered. Monitor - No evidence of Hepatic Encephalopathy.  * Thrombocytopenia chronic - stable and will follow counts.  - due to ETOH liver disease.   * ESRD on hemodialysis -Nephrology consultation placed  -On Tuesday Thursday Saturday hemodialysis. Dialysis today per schedule  * DVT prophylaxis-hold subcutaneous heparin for possible procedure today   Encourage ambulation. Anticipate discharge tomorrow      All the records are reviewed and case discussed with Care Management/Social Workerr. Management plans discussed with the patient, family and they are in agreement.  CODE STATUS: Full code  TOTAL TIME TAKING CARE OF THIS PATIENT: 38 minutes.   POSSIBLE D/C TOMORROW, DEPENDING ON CLINICAL CONDITION.   Gladstone Lighter M.D on 07/05/2016 at 8:54 AM  Between 7am to 6pm - Pager - [phone]  After 6pm go to [email or web address] - password [email or web address] [facility]  Office  [phone]  CC: Primary care physician; Mario Legato, MD

## 2016-07-06 LAB — BASIC METABOLIC PANEL
Anion gap: 6 (ref 5–15)
BUN: 20 mg/dL (ref 6–20)
CALCIUM: 9.1 mg/dL (ref 8.9–10.3)
CHLORIDE: 96 mmol/L — AB (ref 101–111)
CO2: 34 mmol/L — AB (ref 22–32)
CREATININE: 5.41 mg/dL — AB (ref 0.61–1.24)
GFR calc non Af Amer: 11 mL/min — ABNORMAL LOW (ref >60)
GFR, EST AFRICAN AMERICAN: 13 mL/min — AB (ref >60)
GLUCOSE: 105 mg/dL — AB (ref 65–99)
Potassium: 4.2 mmol/L (ref 3.5–5.1)
Sodium: 136 mmol/L (ref 135–145)

## 2016-07-06 LAB — GRAM STAIN

## 2016-07-06 MED ORDER — LISINOPRIL 10 MG PO TABS: 10.0000 mg | ORAL_TABLET | Freq: Every day | ORAL | 2 refills | Status: DC

## 2016-07-06 NOTE — Progress Notes (Signed)
[facility]  ROUNDING NOTE   Subjective:  Patient completed hemodialysis yesterday. Currently in good spirits. Denies shortness of breath or chest pain.  Objective:  Vital signs in last 24 hours:  Temp:  [97.4 F (36.3 C)-99.2 F (37.3 C)] 98 F (36.7 C) (12/03 0800) Pulse Rate:  [66-80] 67 (12/03 0800) Resp:  [16-25] 16 (12/03 0800) BP: (127-187)/(49-83) 154/79 (12/03 0800) SpO2:  [96 %-100 %] 96 % (12/03 0800) Weight:  [73.5 kg (162 lb 0.6 oz)-75 kg (165 lb 5.5 oz)] 73.5 kg (162 lb 0.6 oz) (12/02 1813)  Weight change:  Filed Weights   07/03/16 1853 07/05/16 1434 07/05/16 1813  Weight: 75.5 kg (166 lb 7.2 oz) 75 kg (165 lb 5.5 oz) 73.5 kg (162 lb 0.6 oz)    Intake/Output: I/O last 3 completed shifts: In: 3 [I.V.:3] Out: 1500 [Other:1500]   Intake/Output this shift:  No intake/output data recorded.  Physical Exam: General: No acute distress  Head: Normocephalic, atraumatic. Moist oral mucosal membranes  Eyes: Anicteric  Neck: Supple, trachea midline  Lungs:  Clear to auscultation, normal effort  Heart: S1S2 no rubs  Abdomen:  Soft, nontender, mild distension  Extremities: No peripheral edema.  Neurologic: Nonfocal, moving all four extremities  Skin: No lesions  Access: LUE AVF    Basic Metabolic Panel:  Recent Labs Lab 07/03/16 1030 07/03/16 1642 07/05/16 0918 07/06/16 0535  NA 135  --   --  136  K 4.7  --   --  4.2  CL 93*  --   --  96*  CO2 26  --   --  34*  GLUCOSE 128*  --   --  105*  BUN 46*  --   --  20  CREATININE 11.26*  --   --  5.41*  CALCIUM 9.6  --   --  9.1  PHOS  --  4.3 7.4*  --     Liver Function Tests: No results for input(s): AST, ALT, ALKPHOS, BILITOT, PROT, ALBUMIN in the last 168 hours. No results for input(s): LIPASE, AMYLASE in the last 168 hours. No results for input(s): AMMONIA in the last 168 hours.  CBC:  Recent Labs Lab 07/03/16 1030  WBC 5.7  NEUTROABS 4.5  HGB 9.7*  HCT 28.2*  MCV 98.8  PLT  134*    Cardiac Enzymes:  Recent Labs Lab 07/03/16 1030 07/03/16 2055 07/04/16 0241 07/04/16 0840  TROPONINI 0.05* 0.04* 0.04* 0.03*    BNP: Invalid input(s): POCBNP  CBG: No results for input(s): GLUCAP in the last 168 hours.  Microbiology: Results for orders placed or performed during the hospital encounter of 07/03/16  Culture, body fluid-bottle     Status: None (Preliminary result)   Collection Time: 07/05/16  1:30 PM  Result Value Ref Range Status   Specimen Description PERITONEAL  Final   Special Requests   Final    BOTTLES DRAWN AEROBIC AND ANAEROBIC 5CC Performed at [facility]    Culture PENDING  Incomplete   Report Status PENDING  Incomplete  Gram stain     Status: None   Collection Time: 07/05/16  1:30 PM  Result Value Ref Range Status   Specimen Description PERITONEAL  Final   Special Requests NONE  Final   Gram Stain   Final    WBC PRESENT, PREDOMINANTLY MONONUCLEAR NO ORGANISMS SEEN CYTOSPIN SMEAR Performed at [facility]    Report Status 07/06/2016 FINAL  Final    Coagulation Studies:  Recent Labs  07/05/16 0918  LABPROT 17.2*  INR 1.39    Urinalysis: No results for input(s): COLORURINE, LABSPEC, PHURINE, GLUCOSEU, HGBUR, BILIRUBINUR, KETONESUR, PROTEINUR, UROBILINOGEN, NITRITE, LEUKOCYTESUR in the last 72 hours.  Invalid input(s): APPERANCEUR    Imaging: US Abdomen Limited  Result Date: 07/05/2016 CLINICAL DATA:  Ascites. EXAM: LIMITED ABDOMEN ULTRASOUND FOR ASCITES TECHNIQUE: Limited ultrasound survey for ascites was performed in all four abdominal quadrants. COMPARISON:  04/16/2016. FINDINGS: Moderate amount of free peritoneal fluid in all 4 quadrants of the abdomen. IMPRESSION: Moderate ascites. Electronically Signed   By: Claudie Revering M.D.   On: 07/05/2016 13:02   US Paracentesis  Result Date: 07/05/2016 INDICATION: Cirrhosis, ascites, abdominal distension, renal failure EXAM: ULTRASOUND GUIDED PARACENTESIS  MEDICATIONS: 1% lidocaine local COMPLICATIONS: None immediate. PROCEDURE: An ultrasound guided paracentesis was thoroughly discussed with the patient and questions answered. The benefits, risks, alternatives and complications were also discussed. The patient understands and wishes to proceed with the procedure. Written consent was obtained. Ultrasound was performed to localize and mark an adequate pocket of fluid in the right lower quadrant of the abdomen. The area was then prepped and draped in the normal sterile fashion. 1% Lidocaine was used for local anesthesia. Under ultrasound guidance a Safe-T-Centesis needle catheter was introduced. Paracentesis was performed. The catheter was removed and a dressing applied. FINDINGS: A total of approximately 4.3 L of amber colored peritoneal fluid was removed. A fluid sample was sent for laboratory analysis. IMPRESSION: Successful ultrasound guided paracentesis yielding 4.3 L of ascites. Electronically Signed   By: Jerilynn Mages.  Shick M.D.   On: 07/05/2016 14:09     Medications:    . Influenza vac split quadrivalent PF  0.5 mL Intramuscular Tomorrow-1000  . lanthanum  500 mg Oral Q breakfast  . lisinopril  5 mg Oral Daily  . metoprolol tartrate  25 mg Oral BID  . sodium chloride flush  3 mL Intravenous Q12H   sodium chloride, sodium chloride, acetaminophen **OR** acetaminophen, alteplase, heparin, lidocaine (PF), lidocaine-prilocaine, ondansetron **OR** ondansetron (ZOFRAN) IV, pentafluoroprop-tetrafluoroeth, traMADol  Assessment/ Plan:  45 y.o.  Hispanic male with a PMHX of end-stage renal disease on hemodialysis Tuesday, Thursday, Saturday, hypertension, anemia chronic kidney disease, secondary hyperparathyroidism, left upper extremity AV fistula,   TTS [facility] [place].   1. ESRD on HD TTS. Patient has had transportation issues recently and therefore has not been fully adherent with his dialysis sessions. He presented this time with acute  respiratory failure requiring BiPAP.  - Patient completed hemodialysis yesterday. No acute indication for dialysis today. We will plan for dialysis again on Tuesday if still here.  2. Anemia of chronic kidney disease. Hemoglobin currently 9.7.  Continue mircera as an outpatient.   3. Secondary hyperparathyroidism. Last phosphorus was found to be 7.4 but the phosphorus prior to this was 4.3. Continue to monitor bone mineral metabolism parameters. Continue Fosrenol.  4. Hypertension:  Blood pressure at the moment is 154/79.  Continue lisinopril and metoprolol.  5.  Acute respiratory failure:  Patient came in with volume overload. He required BiPAP for a short period of time and then improved with dialysis. Now doing well.     LOS: 3 Beckem Tomberlin 12/3/20171:07 PM

## 2016-07-06 NOTE — Progress Notes (Signed)
Pt to be discharged this afternoon. Iv and tele removed. Pt's own meds retrieved from pharmacy and given to him. Interpreter present during disch instructions.

## 2016-07-06 NOTE — Care Management Note (Signed)
Case Management Note  Patient Details  Name: Mario [name] MRN: [id number] Date of Birth: [birth year]  Subjective/Objective:     Discharge plan was to enroll uninsured [name] in the [facility] but he was discharged prior to the case manager completing the [facility] enrollment on-line.                Action/Plan:   Expected Discharge Date:  07/05/16               Expected Discharge Plan:     In-House Referral:     Discharge planning Services     Post Acute Care Choice:    Choice offered to:     DME Arranged:    DME Agency:     HH Arranged:    HH Agency:     Status of Service:     If discussed at [employer], dates discussed:    Additional Comments:  Taqwa Deem A, RN 07/06/2016, 1:28 PM

## 2016-07-07 NOTE — Discharge Summary (Signed)
[facility] [place] NAME: Mario [name]    MR#:  [id number]  DATE OF BIRTH:  [birth year]  DATE OF ADMISSION:  07/03/2016   ADMITTING PHYSICIAN: Fritzi Mandes, MD  DATE OF DISCHARGE: 07/06/2016  1:17 PM  PRIMARY CARE PHYSICIAN: [name], MD   ADMISSION DIAGNOSIS:   ESRD on dialysis ([place]) [N18.6, Z99.2] Acute respiratory failure, unspecified whether with hypoxia or hypercapnia (HCC) [J96.00] Chest pain, unspecified type [R07.9]  DISCHARGE DIAGNOSIS:   Active Problems:   Pulmonary edema   SECONDARY DIAGNOSIS:   Past Medical History:  Diagnosis Date  . Dialysis patient ([place])   . Hypertension   . Renal disorder   . Renal insufficiency     HOSPITAL COURSE:   Mario [name] a 45 y.o. malewith a known history of End stage renal disease due to hypertension on hemodialysis, history of chronic alcoholism with ascites, chronic, cytopenia comes to the emergency room with increasing shortness of breath was found to be in pulmonary edema.  *Acute On Chronic systolic CHF, EF 82%UMPN acute flash pulmonary edema. -Required BiPAP and admission, currently off oxygen and on room air. -Improved breathing with dialysis. Does not make much urine at baseline. -Continue with dialysis. Advised low-sodium diet and fluid restriction.  * Uncontrolled hypertension - improved after dialysis. -Continue metoprolol and lisinopril. Doses adjusted  * Chronic alcoholism with h/oAscites - worsening abdominal distention this admission again associated with pain and nausea and vomiting. - Last paracentesis in September and almost 3.6 L fluid taken out. Repeat paracentesis this admission and almost 4.3L fluid taken out - No evidence of Hepatic Encephalopathy.  * Thrombocytopenia chronic - stable and will follow counts.  - due to ETOH liver disease.   *ESRD on hemodialysis -Nephrology consultation placed  -On Tuesday Thursday  Saturday hemodialysis.   Patient is stable and so being discharged home today.  DISCHARGE CONDITIONS:   Guarded  CONSULTS OBTAINED:    Nephrology consultation by Dr. Holley Raring  DRUG ALLERGIES:   No Known Allergies DISCHARGE MEDICATIONS:     Medication List    TAKE these medications   lanthanum 500 MG chewable tablet Commonly known as:  FOSRENOL Chew 500 mg by mouth daily.   lisinopril 10 MG tablet Commonly known as:  PRINIVIL,ZESTRIL Take 1 tablet (10 mg total) by mouth daily. What changed:  medication strength  how much to take   metoprolol tartrate 25 MG tablet Commonly known as:  LOPRESSOR Take 1 tablet (25 mg total) by mouth 2 (two) times daily.        DISCHARGE INSTRUCTIONS:   1. Nephrology follow-up for dialysis per schedule 2. PCP follow-up in 1-2 weeks  DIET:   Cardiac diet  ACTIVITY:   Activity as tolerated  OXYGEN:   Home Oxygen: No.  Oxygen Delivery: room air  DISCHARGE LOCATION:   home   If you experience worsening of your admission symptoms, develop shortness of breath, life threatening emergency, suicidal or homicidal thoughts you must seek medical attention immediately by calling 911 or calling your MD immediately  if symptoms less severe.  You Must read complete instructions/literature along with all the possible adverse reactions/side effects for all the Medicines you take and that have been prescribed to you. Take any new Medicines after you have completely understood and accpet all the possible adverse reactions/side effects.   Please note  You were cared for by a hospitalist during your hospital stay. If you have any questions about your  discharge medications or the care you received while you were in the hospital after you are discharged, you can call the unit and asked to speak with the hospitalist on call if the hospitalist that took care of you is not available. Once you are discharged, your primary care physician will handle  any further medical issues. Please note that NO REFILLS for any discharge medications will be authorized once you are discharged, as it is imperative that you return to your primary care physician (or establish a relationship with a primary care physician if you do not have one) for your aftercare needs so that they can reassess your need for medications and monitor your lab values.    On the day of Discharge:  VITAL SIGNS:   Blood pressure (!) 154/79, pulse 67, temperature 98 F (36.7 C), temperature source Oral, resp. rate 16, weight 73.5 kg (162 lb 0.6 oz), SpO2 96 %.  PHYSICAL EXAMINATION:    GENERAL:  45 y.o.-year-old patient lying in the bed with no acute distress.  EYES: Pupils equal, round, reactive to light and accommodation. No scleral icterus. Extraocular muscles intact.  HEENT: Head atraumatic, normocephalic. Oropharynx and nasopharynx clear.  NECK:  Supple, no jugular venous distention. No thyroid enlargement, no tenderness.  LUNGS: Normal breath sounds bilaterally, no wheezing, rales,rhonchi or crepitation. No use of accessory muscles of respiration. Decreased bibasilar breath sounds CARDIOVASCULAR: S1, S2 normal. No rubs, or gallops. 2/6 systolic murmur present. ABDOMEN: Soft, nontender, nondistended. Bowel sounds present. No organomegaly or mass.  EXTREMITIES: No pedal edema, cyanosis, or clubbing. Left arm fistula with good thrill present. NEUROLOGIC: Cranial nerves II through XII are intact. Muscle strength 5/5 in all extremities. Sensation intact. Gait not checked.  PSYCHIATRIC: The patient is alert and oriented x 3.  SKIN: No obvious rash, lesion, or ulcer.   DATA REVIEW:   CBC  Recent Labs Lab 07/03/16 1030  WBC 5.7  HGB 9.7*  HCT 28.2*  PLT 134*    Chemistries   Recent Labs Lab 07/06/16 0535  NA 136  K 4.2  CL 96*  CO2 34*  GLUCOSE 105*  BUN 20  CREATININE 5.41*  CALCIUM 9.1     Microbiology Results  Results for orders placed or performed  during the hospital encounter of 07/03/16  Culture, body fluid-bottle     Status: None (Preliminary result)   Collection Time: 07/05/16  1:30 PM  Result Value Ref Range Status   Specimen Description PERITONEAL  Final   Special Requests BOTTLES DRAWN AEROBIC AND ANAEROBIC 5CC  Final   Culture   Final    NO GROWTH < 24 HOURS Performed at [facility]    Report Status PENDING  Incomplete  Gram stain     Status: None   Collection Time: 07/05/16  1:30 PM  Result Value Ref Range Status   Specimen Description PERITONEAL  Final   Special Requests NONE  Final   Gram Stain   Final    WBC PRESENT, PREDOMINANTLY MONONUCLEAR NO ORGANISMS SEEN CYTOSPIN SMEAR Performed at [facility]    Report Status 07/06/2016 FINAL  Final    RADIOLOGY:  No results found.   Management plans discussed with the patient, family and they are in agreement.  CODE STATUS:  Code Status History    Date Active Date Inactive Code Status Order ID Comments User Context   07/03/2016  8:06 PM 07/06/2016  4:22 PM Full Code [id number]  Fritzi Mandes, MD ED   04/11/2016  6:57  PM 04/12/2016  7:16 AM Full Code [id number]  Vaughan Basta, MD Inpatient   01/25/2015 12:29 PM 01/28/2015  4:26 PM Full Code [id number]  Dustin Flock, MD Inpatient      TOTAL TIME TAKING CARE OF THIS PATIENT: 37 minutes.    Jashanti Clinkscale M.D on 07/07/2016 at 2:00 PM  Between 7am to 6pm - Pager - [phone]  After 6pm go to [email or web address] - Proofreader  [facility]  Office  [phone]  CC: Primary care physician; [name], MD   Note: This dictation was prepared with Dragon dictation along with smaller phrase technology. Any transcriptional errors that result from this process are unintentional.

## 2016-07-07 NOTE — Care Management (Signed)
Notified Jeannine Kitten with [facility] of discharge.  DC summary is not avalialbe

## 2016-07-08 LAB — CYTOLOGY - NON PAP

## 2016-07-10 LAB — CULTURE, BODY FLUID W GRAM STAIN -BOTTLE: Culture: NO GROWTH

## 2016-07-10 LAB — CULTURE, BODY FLUID-BOTTLE

## 2016-08-28 ENCOUNTER — Emergency Department: Payer: Medicaid Other

## 2016-08-28 ENCOUNTER — Encounter: Payer: Self-pay | Admitting: Emergency Medicine

## 2016-08-28 ENCOUNTER — Observation Stay
Admission: EM | Admit: 2016-08-28 | Discharge: 2016-08-30 | Disposition: A | Discharge status: Home / Self Care | Payer: Medicaid Other | Attending: Internal Medicine | Admitting: Internal Medicine

## 2016-08-28 DIAGNOSIS — N281 Cyst of kidney, acquired: Secondary | ICD-10-CM | POA: Diagnosis not present

## 2016-08-28 DIAGNOSIS — N492 Inflammatory disorders of scrotum: Secondary | ICD-10-CM | POA: Insufficient documentation

## 2016-08-28 DIAGNOSIS — F17210 Nicotine dependence, cigarettes, uncomplicated: Secondary | ICD-10-CM | POA: Insufficient documentation

## 2016-08-28 DIAGNOSIS — Z841 Family history of disorders of kidney and ureter: Secondary | ICD-10-CM | POA: Diagnosis not present

## 2016-08-28 DIAGNOSIS — Z79899 Other long term (current) drug therapy: Secondary | ICD-10-CM | POA: Insufficient documentation

## 2016-08-28 DIAGNOSIS — R109 Unspecified abdominal pain: Secondary | ICD-10-CM

## 2016-08-28 DIAGNOSIS — I132 Hypertensive heart and chronic kidney disease with heart failure and with stage 5 chronic kidney disease, or end stage renal disease: Secondary | ICD-10-CM | POA: Diagnosis not present

## 2016-08-28 DIAGNOSIS — D631 Anemia in chronic kidney disease: Secondary | ICD-10-CM | POA: Diagnosis not present

## 2016-08-28 DIAGNOSIS — N186 End stage renal disease: Secondary | ICD-10-CM | POA: Insufficient documentation

## 2016-08-28 DIAGNOSIS — D696 Thrombocytopenia, unspecified: Secondary | ICD-10-CM | POA: Diagnosis not present

## 2016-08-28 DIAGNOSIS — I5023 Acute on chronic systolic (congestive) heart failure: Secondary | ICD-10-CM | POA: Diagnosis not present

## 2016-08-28 DIAGNOSIS — Z992 Dependence on renal dialysis: Secondary | ICD-10-CM | POA: Diagnosis not present

## 2016-08-28 DIAGNOSIS — K2970 Gastritis, unspecified, without bleeding: Principal | ICD-10-CM | POA: Insufficient documentation

## 2016-08-28 DIAGNOSIS — K859 Acute pancreatitis without necrosis or infection, unspecified: Secondary | ICD-10-CM

## 2016-08-28 DIAGNOSIS — Z833 Family history of diabetes mellitus: Secondary | ICD-10-CM | POA: Insufficient documentation

## 2016-08-28 DIAGNOSIS — N2581 Secondary hyperparathyroidism of renal origin: Secondary | ICD-10-CM | POA: Insufficient documentation

## 2016-08-28 DIAGNOSIS — K7031 Alcoholic cirrhosis of liver with ascites: Secondary | ICD-10-CM | POA: Diagnosis not present

## 2016-08-28 DIAGNOSIS — R188 Other ascites: Secondary | ICD-10-CM

## 2016-08-28 HISTORY — DX: Unspecified abdominal pain: R10.9

## 2016-08-28 LAB — INFLUENZA PANEL BY PCR (TYPE A & B)
INFLAPCR: NEGATIVE
Influenza B By PCR: NEGATIVE

## 2016-08-28 LAB — CBC
HCT: 33.3 % — ABNORMAL LOW (ref 40.0–52.0)
Hemoglobin: 10.9 g/dL — ABNORMAL LOW (ref 13.0–18.0)
MCH: 32.1 pg (ref 26.0–34.0)
MCHC: 32.8 g/dL (ref 32.0–36.0)
MCV: 98 fL (ref 80.0–100.0)
PLATELETS: 132 10*3/uL — AB (ref 150–440)
RBC: 3.4 MIL/uL — ABNORMAL LOW (ref 4.40–5.90)
RDW: 15.4 % — AB (ref 11.5–14.5)
WBC: 4.3 10*3/uL (ref 3.8–10.6)

## 2016-08-28 LAB — COMPREHENSIVE METABOLIC PANEL
ALBUMIN: 3.8 g/dL (ref 3.5–5.0)
ALK PHOS: 171 U/L — AB (ref 38–126)
ALT: 20 U/L (ref 17–63)
AST: 29 U/L (ref 15–41)
Anion gap: 15 (ref 5–15)
BUN: 46 mg/dL — ABNORMAL HIGH (ref 6–20)
CALCIUM: 9 mg/dL (ref 8.9–10.3)
CO2: 22 mmol/L (ref 22–32)
CREATININE: 9.48 mg/dL — AB (ref 0.61–1.24)
Chloride: 98 mmol/L — ABNORMAL LOW (ref 101–111)
GFR calc Af Amer: 7 mL/min — ABNORMAL LOW (ref >60)
GFR calc non Af Amer: 6 mL/min — ABNORMAL LOW (ref >60)
GLUCOSE: 76 mg/dL (ref 65–99)
Potassium: 4.4 mmol/L (ref 3.5–5.1)
Sodium: 135 mmol/L (ref 135–145)
Total Bilirubin: 1.3 mg/dL — ABNORMAL HIGH (ref 0.3–1.2)
Total Protein: 7.6 g/dL (ref 6.5–8.1)

## 2016-08-28 LAB — MRSA PCR SCREENING: MRSA by PCR: NEGATIVE

## 2016-08-28 LAB — LIPASE, BLOOD: Lipase: 104 U/L — ABNORMAL HIGH (ref 11–51)

## 2016-08-28 MED ORDER — LANTHANUM CARBONATE 500 MG PO CHEW
500.0000 mg | CHEWABLE_TABLET | Freq: Every day | ORAL | Status: DC
  Administered 2016-08-29: 500 mg via ORAL
  Filled 2016-08-28: qty 1

## 2016-08-28 MED ORDER — ACETAMINOPHEN 650 MG RE SUPP: 650.0000 mg | Freq: Four times a day (QID) | RECTAL | Status: DC | PRN

## 2016-08-28 MED ORDER — LISINOPRIL 10 MG PO TABS
10.0000 mg | ORAL_TABLET | Freq: Every day | ORAL | Status: DC
  Administered 2016-08-28: 10 mg via ORAL
  Filled 2016-08-28: qty 1

## 2016-08-28 MED ORDER — MORPHINE SULFATE (PF) 2 MG/ML IV SOLN
2.0000 mg | Freq: Four times a day (QID) | INTRAVENOUS | Status: DC | PRN
  Administered 2016-08-28 – 2016-08-30 (×5): 2 mg via INTRAVENOUS
  Filled 2016-08-28 (×5): qty 1

## 2016-08-28 MED ORDER — ACETAMINOPHEN 325 MG PO TABS
650.0000 mg | ORAL_TABLET | Freq: Four times a day (QID) | ORAL | Status: DC | PRN
  Administered 2016-08-29: 650 mg via ORAL
  Filled 2016-08-28: qty 2

## 2016-08-28 MED ORDER — ACETAMINOPHEN 500 MG PO TABS
1000.0000 mg | ORAL_TABLET | Freq: Once | ORAL | Status: AC
  Administered 2016-08-28: 1000 mg via ORAL
  Filled 2016-08-28: qty 2

## 2016-08-28 MED ORDER — BISACODYL 5 MG PO TBEC: 5.0000 mg | DELAYED_RELEASE_TABLET | Freq: Every day | ORAL | Status: DC | PRN

## 2016-08-28 MED ORDER — MORPHINE SULFATE (PF) 4 MG/ML IV SOLN
4.0000 mg | Freq: Once | INTRAVENOUS | Status: AC
  Administered 2016-08-28: 4 mg via INTRAVENOUS
  Filled 2016-08-28: qty 1

## 2016-08-28 MED ORDER — HEPARIN SODIUM (PORCINE) 5000 UNIT/ML IJ SOLN
5000.0000 [IU] | Freq: Three times a day (TID) | INTRAMUSCULAR | Status: DC
  Administered 2016-08-28 – 2016-08-30 (×5): 5000 [IU] via SUBCUTANEOUS
  Filled 2016-08-28 (×5): qty 1

## 2016-08-28 MED ORDER — PNEUMOCOCCAL VAC POLYVALENT 25 MCG/0.5ML IJ INJ
0.5000 mL | INJECTION | INTRAMUSCULAR | Status: AC
  Administered 2016-08-30: 0.5 mL via INTRAMUSCULAR
  Filled 2016-08-28: qty 0.5

## 2016-08-28 MED ORDER — ONDANSETRON HCL 4 MG/2ML IJ SOLN: 4.0000 mg | Freq: Four times a day (QID) | INTRAMUSCULAR | Status: DC | PRN

## 2016-08-28 MED ORDER — SENNOSIDES-DOCUSATE SODIUM 8.6-50 MG PO TABS: 1.0000 | ORAL_TABLET | Freq: Every evening | ORAL | Status: DC | PRN

## 2016-08-28 MED ORDER — TRAMADOL HCL 50 MG PO TABS
50.0000 mg | ORAL_TABLET | Freq: Two times a day (BID) | ORAL | Status: DC | PRN
  Administered 2016-08-28 – 2016-08-30 (×3): 50 mg via ORAL
  Filled 2016-08-28 (×4): qty 1

## 2016-08-28 MED ORDER — HYDRALAZINE HCL 20 MG/ML IJ SOLN
10.0000 mg | Freq: Four times a day (QID) | INTRAMUSCULAR | Status: DC | PRN
  Administered 2016-08-29: 10 mg via INTRAVENOUS
  Filled 2016-08-28 (×2): qty 1

## 2016-08-28 MED ORDER — METOPROLOL TARTRATE 25 MG PO TABS
25.0000 mg | ORAL_TABLET | Freq: Two times a day (BID) | ORAL | Status: DC
  Administered 2016-08-28 – 2016-08-30 (×3): 25 mg via ORAL
  Filled 2016-08-28 (×4): qty 1

## 2016-08-28 MED ORDER — ONDANSETRON HCL 4 MG PO TABS: 4.0000 mg | ORAL_TABLET | Freq: Four times a day (QID) | ORAL | Status: DC | PRN

## 2016-08-28 MED ORDER — SODIUM CHLORIDE 0.9 % IV SOLN
Freq: Once | INTRAVENOUS | Status: AC
  Administered 2016-08-28: 18:00:00 via INTRAVENOUS

## 2016-08-28 NOTE — ED Provider Notes (Signed)
[facility] Emergency Department Provider Note  ____________________________________________  Time seen: Approximately 12:59 PM  I have reviewed the triage vital signs and the nursing notes.   HISTORY  Chief Complaint Weakness   HPI Mario [name] is a 46 y.o. male with h/o ESRD on HD (TTS) and hypertension who presents for evaluation of generalized body aches and weakness. Patient reports one week of cough productive of yellow/white sputum. For the last 3 days he has had the generalized weakness, malaise, body aches, worsening cough, sore throat, headache, chills, vomiting, diarrhea, and subjective fever. His last dialysis was on Tuesday. Today he reports that he felt too sick to go to dialysis. He does not think he is up-to-date with his flu and pneumonia shots. He denies chest pain. He reports progressively worsening shortness of breath over the course of the last 3 days. No wheezing.  Past Medical History:  Diagnosis Date  . Dialysis patient ([place])   . Hypertension   . Renal disorder   . Renal insufficiency     Patient Active Problem List   Diagnosis Date Noted  . Abdominal pain 08/28/2016  . Pulmonary edema 07/03/2016  . Fluid overload 04/12/2016  . Chest pain 04/11/2016  . Scrotal infection 01/25/2015  . ESRD (end stage renal disease) ([place]) 01/25/2015    Past Surgical History:  Procedure Laterality Date  . AV FISTULA PLACEMENT      Prior to Admission medications   Medication Sig Start Date End Date Taking? Authorizing Provider  lanthanum (FOSRENOL) 500 MG chewable tablet Chew 500 mg by mouth daily.   Yes Historical Provider, MD  lisinopril (PRINIVIL,ZESTRIL) 10 MG tablet Take 1 tablet (10 mg total) by mouth daily. 07/06/16  Yes Gladstone Lighter, MD  metoprolol tartrate (LOPRESSOR) 25 MG tablet Take 1 tablet (25 mg total) by mouth 2 (two) times daily. 04/16/16  Yes Henreitta Leber, MD    Allergies Patient has no known  allergies.  Family History  Problem Relation Age of Onset  . Kidney failure Father   . Diabetes Mother     Social History Social History  Substance Use Topics  . Smoking status: Current Every Day Smoker    Packs/day: 0.25    Types: Cigarettes  . Smokeless tobacco: Never Used  . Alcohol use No    Review of Systems  Constitutional: + fever, myalgias and generalized weakness Eyes: Negative for visual changes. ENT: + sore throat. Neck: No neck pain  Cardiovascular: Negative for chest pain. Respiratory: + shortness of breath, and cough Gastrointestinal: Negative for abdominal pain. + vomiting and diarrhea. Genitourinary: Negative for dysuria. Musculoskeletal: Negative for back pain. Skin: Negative for rash. Neurological: Negative for headaches, weakness or numbness. Psych: No SI or HI  ____________________________________________   PHYSICAL EXAM:  VITAL SIGNS: ED Triage Vitals [08/28/16 1151]  Enc Vitals Group     BP (!) 161/85     Pulse Rate 74     Resp 20     Temp 98.3 F (36.8 C)     Temp Source Oral     SpO2 100 %     Weight      Height      Head Circumference      Peak Flow      Pain Score 9     Pain Loc      Pain Edu?      Excl. in [place]?     Constitutional: Alert and oriented. Well appearing and in no apparent distress.  HEENT:      Head: Normocephalic and atraumatic.         Eyes: Conjunctivae are normal. Sclera is non-icteric. EOMI. PERRL      Mouth/Throat: Mucous membranes are moist.       Neck: Supple with no signs of meningismus. Cardiovascular: Regular rate and rhythm. No murmurs, gallops, or rubs. 2+ symmetrical distal pulses are present in all extremities. No JVD. Respiratory: Normal respiratory effort. Lungs are clear to auscultation bilaterally. No wheezes, crackles, or rhonchi.  Gastrointestinal: Soft, ttp over the epigastric region, and non distended with positive bowel sounds. No rebound or guarding. Musculoskeletal: Nontender with normal  range of motion in all extremities. No edema, cyanosis, or erythema of extremities. Neurologic: Normal speech and language. Face is symmetric. Moving all extremities. No gross focal neurologic deficits are appreciated. Skin: Skin is warm, dry and intact. No rash noted. Psychiatric: Mood and affect are normal. Speech and behavior are normal.  ____________________________________________   LABS (all labs ordered are listed, but only abnormal results are displayed)  Labs Reviewed  LIPASE, BLOOD - Abnormal; Notable for the following:       Result Value   Lipase 104 (*)    All other components within normal limits  COMPREHENSIVE METABOLIC PANEL - Abnormal; Notable for the following:    Chloride 98 (*)    BUN 46 (*)    Creatinine, Ser 9.48 (*)    Alkaline Phosphatase 171 (*)    Total Bilirubin 1.3 (*)    GFR calc non Af Amer 6 (*)    GFR calc Af Amer 7 (*)    All other components within normal limits  CBC - Abnormal; Notable for the following:    RBC 3.40 (*)    Hemoglobin 10.9 (*)    HCT 33.3 (*)    RDW 15.4 (*)    Platelets 132 (*)    All other components within normal limits  INFLUENZA PANEL BY PCR (TYPE A & B)  URINALYSIS, COMPLETE (UACMP) WITH MICROSCOPIC   ____________________________________________  EKG  ED ECG REPORT I, Rudene Re, the attending physician, personally viewed and interpreted this ECG.  Normal sinus rhythm, rate of 72, normal intervals, normal axis, no ST elevations or depressions, T-wave inversion in lead 3 and aVF which are unchanged from prior. ____________________________________________  RADIOLOGY  CXR: Negative  RUQ [place]: PND ____________________________________________   PROCEDURES  Procedure(s) performed: None Procedures Critical Care performed:  None ____________________________________________   INITIAL IMPRESSION / ASSESSMENT AND PLAN / ED COURSE   46 y.o. male with h/o ESRD on HD (TTS) and hypertension who presents for  evaluation of generalized body aches, weakness, fever, cough, SOB, vomiting and diarrhea. Vital signs are within normal limits, patient has no meningeal signs, no rash, lungs are clear to auscultation with good air movement with no crackles or wheezes, abdomen is soft and nontender throughout, oropharynx is clear. Presentation concerning for flu or flulike illness. We'll check flu swab, chest x-ray, basic lab work. We'll give Tylenol for symptom relief.    _________________________ 2:15 PM on 08/28/2016 ----------------------------------------- Lipase elevated at 104. Will get RUQ [place] to eval for gallstone pancreatitis. Will give morphine for pain. Patient reports that he used to be a heavy drinker however stopped drinking 3 months ago. Flu and chest x-ray with no evidence of infection. No need for emergent dialysis with normal K and no acidosis.  _________________________ 4:03 PM on 08/28/2016 -----------------------------------------  Patient continues to complain of significant epigastric pain. Ultrasound is pending. Patient  was admitted to the hospitalist service.  Pertinent labs & imaging results that were available during my care of the patient were reviewed by me and considered in my medical decision making (see chart for details).    ____________________________________________   FINAL CLINICAL IMPRESSION(S) / ED DIAGNOSES  Final diagnoses:  Pancreatitis  Acute pancreatitis, unspecified complication status, unspecified pancreatitis type      NEW MEDICATIONS STARTED DURING THIS VISIT:  New Prescriptions   No medications on file     Note:  This document was prepared using Dragon voice recognition software and may include unintentional dictation errors.    Rudene Re, MD 08/28/16 [phone]

## 2016-08-28 NOTE — ED Notes (Signed)
PT placed on droplet precautions.

## 2016-08-28 NOTE — ED Notes (Signed)
ED Provider at bedside. 

## 2016-08-28 NOTE — ED Notes (Signed)
Assessment via interpreter.

## 2016-08-28 NOTE — ED Notes (Signed)
Patient transported to X-ray 

## 2016-08-28 NOTE — H&P (Signed)
[place] [facility] [place] NAME: Mario [name]    MR#:  [id number]  DATE OF BIRTH:  May 18, 1971  DATE OF ADMISSION:  08/28/2016  PRIMARY CARE PHYSICIAN: [name], MD   REQUESTING/REFERRING PHYSICIAN: Dr. Alfred Levins  CHIEF COMPLAINT:  Abdominal pain and generalized weakness not feeling well. History obtained via [place] interpreter.  HISTORY OF PRESENT ILLNESS:  Mario [name]  is a 46 y.o. male  with h/o ESRD on HD (TTS) and hypertension who presents for evaluation of generalized body aches and weakness. Patient reports one week of cough productive of yellow/white sputum. For the last 3 days he has had the generalized weakness, malaise, body aches, worsening cough, sore throat, headache, chills, vomiting, diarrhea, and subjective fever. His last dialysis was on Tuesday. Upon asking several times patient then reported he drank hard liquor on Saturday cannot quantitate it. He started having abdominal pain since then and noticed abdominal distention 2. Patient has cirrhosis of liver and has had multiple times paracentesis done most recent one was done on 07/06/2016 when 3.6 L of acetic fluid was removed. Lipase was 104. Ultrasound of the abdomen is pending patient is being admitted for further nausea management.  PAST MEDICAL HISTORY:   Past Medical History:  Diagnosis Date  . Dialysis patient ([place])   . Hypertension   . Renal disorder   . Renal insufficiency     PAST SURGICAL HISTOIRY:   Past Surgical History:  Procedure Laterality Date  . AV FISTULA PLACEMENT      SOCIAL HISTORY:   Social History  Substance Use Topics  . Smoking status: Current Every Day Smoker    Packs/day: 0.25    Types: Cigarettes  . Smokeless tobacco: Never Used  . Alcohol use No    FAMILY HISTORY:   Family History  Problem Relation Age of Onset  . Kidney failure Father   . Diabetes Mother     DRUG ALLERGIES:  No Known  Allergies  REVIEW OF SYSTEMS:  Review of Systems  Constitutional: Positive for malaise/fatigue. Negative for chills, fever and weight loss.  HENT: Negative for ear discharge, ear pain and nosebleeds.   Eyes: Negative for blurred vision, pain and discharge.  Respiratory: Negative for sputum production, shortness of breath, wheezing and stridor.   Cardiovascular: Negative for chest pain, palpitations, orthopnea and PND.  Gastrointestinal: Positive for abdominal pain and nausea. Negative for diarrhea and vomiting.  Genitourinary: Negative for frequency and urgency.  Musculoskeletal: Negative for back pain and joint pain.  Neurological: Positive for weakness. Negative for sensory change, speech change and focal weakness.  Psychiatric/Behavioral: Negative for depression and hallucinations. The patient is not nervous/anxious.      MEDICATIONS AT HOME:   Prior to Admission medications   Medication Sig Start Date End Date Taking? Authorizing Provider  lanthanum (FOSRENOL) 500 MG chewable tablet Chew 500 mg by mouth daily.   Yes Historical Provider, MD  lisinopril (PRINIVIL,ZESTRIL) 10 MG tablet Take 1 tablet (10 mg total) by mouth daily. 07/06/16  Yes Gladstone Lighter, MD  metoprolol tartrate (LOPRESSOR) 25 MG tablet Take 1 tablet (25 mg total) by mouth 2 (two) times daily. 04/16/16  Yes Henreitta Leber, MD      VITAL SIGNS:  Blood pressure (!) 198/77, pulse 79, temperature 98.3 F (36.8 C), temperature source Oral, resp. rate (!) 23, SpO2 100 %.  PHYSICAL EXAMINATION:  GENERAL:  46 y.o.-year-old patient lying in the bed with no acute distress. Appears chronically  ill EYES: Pupils equal, round, reactive to light and accommodation. No scleral icterus. Extraocular muscles intact.  HEENT: Head atraumatic, normocephalic. Oropharynx and nasopharynx clear.  NECK:  Supple, no jugular venous distention. No thyroid enlargement, no tenderness.  LUNGS: Normal breath sounds bilaterally, no wheezing,  rales,rhonchi or crepitation. No use of accessory muscles of respiration.  CARDIOVASCULAR: S1, S2 normal. No murmurs, rubs, or gallops.  ABDOMEN: Soft, nontender,distended. Bowel sounds present. No organomegaly or mass. Ascites present EXTREMITIES: No pedal edema, cyanosis, or clubbing. Left upper extremity dialysis access NEUROLOGIC: Cranial nerves II through XII are intact. Muscle strength 5/5 in all extremities. Sensation intact. Gait not checked.  PSYCHIATRIC: The patient is alert and oriented x 3.  SKIN: No obvious rash, lesion, or ulcer.   LABORATORY PANEL:   CBC  Recent Labs Lab 08/28/16 1158  WBC 4.3  HGB 10.9*  HCT 33.3*  PLT 132*   ------------------------------------------------------------------------------------------------------------------  Chemistries   Recent Labs Lab 08/28/16 1158  NA 135  K 4.4  CL 98*  CO2 22  GLUCOSE 76  BUN 46*  CREATININE 9.48*  CALCIUM 9.0  AST 29  ALT 20  ALKPHOS 171*  BILITOT 1.3*   ------------------------------------------------------------------------------------------------------------------  Cardiac Enzymes No results for input(s): TROPONINI in the last 168 hours. ------------------------------------------------------------------------------------------------------------------  RADIOLOGY:  Dg Chest 2 View  Result Date: 08/28/2016 CLINICAL DATA:  Shortness of breath. EXAM: CHEST  2 VIEW COMPARISON:  Radiograph of July 03, 2016. FINDINGS: Stable cardiomediastinal silhouette. No pneumothorax or pleural effusion is noted. No acute pulmonary disease is noted. Bony thorax is unremarkable. IMPRESSION: No active cardiopulmonary disease. Electronically Signed   By: Marijo Conception, M.D.   On: 08/28/2016 13:01   US Abdomen Limited Ruq  Result Date: 08/28/2016 CLINICAL DATA:  History of pancreatitis, epigastric pain, nausea and vomiting for 3 days EXAM: US ABDOMEN LIMITED - RIGHT UPPER QUADRANT COMPARISON:  Ultrasound  abdomen limited of 07/05/2016 FINDINGS: Gallbladder: The gallbladder is visualized and no gallstones are noted. There is no pain over the gallbladder with compression. Common bile duct: Diameter: The common bile duct is normal measuring 3.7 mm in diameter. Liver: The liver is inhomogeneous and echogenic with nodular contours consistent with changes of cirrhosis. No focal hepatic abnormality is seen. Incidental finding of moderate ascites is present. Also several right renal cysts are noted. What is seen of the right renal parenchyma appears echogenic suggesting chronic renal medical disease. IMPRESSION: 1. No gallstones. 2. Echogenic inhomogeneous liver with nodular contours most consistent with changes of cirrhosis. No focal abnormality. 3. Incidental moderate ascites. 4. Somewhat echogenic right renal parenchyma may indicate chronic renal medical disease. Correlate clinically. Electronically Signed   By: Ivar Drape M.D.   On: 08/28/2016 16:06    EKG:    IMPRESSION AND PLAN:   Mario [name]  is a 46 y.o. male  with h/o ESRD on HD (TTS) and hypertension who presents for evaluation of generalized body aches and weakness. Patient reports one week of cough productive of yellow/white sputum. For the last 3 days he has had the generalized weakness, malaise, body aches, worsening cough, sore throat, headache, chills, vomiting, diarrhea, and subjective fever. His last dialysis was on Tuesday.  1. Abdominal pain epigastric with history of alcoholism most recent intake on Saturday with hard liquor and elevated lipase suggestive of acute pancreatitis of all and is -Admit to medical floor -IV fluids at 75 cc an hour -Ultrasound-guided paracentesis-therapeutic -Fluid will not be sent for any study/analysis -Patient advised to abstain  from drinking  2. End-stage renal disease on hemodialysis nephrology consultation for in-house dialysis  3. Known history of alcohol-induced cirrhosis of liver with  multiple times ultrasound-guided paracentesis in the past most recent done on 07/06/2016 3.6 L was removed  4. Hypertension malignant -Resume home meds which is lisinopril and metoprolol. When necessary hydralazine  5. DVT prophylaxis subcutaneous heparin  No family members in the emergency room.   All the records are reviewed and case discussed with ED provider. Management plans discussed with the patient, family and they are in agreement.  CODE STATUS: full  TOTAL TIME TAKING CARE OF THIS PATIENT: 50 minutes.    Haevyn Ury M.D on 08/28/2016 at 4:15 PM  Between 7am to 6pm - Pager - [phone]  After 6pm go to [email or web address] - password [email or web address] [facility]  [facility]  Office  [phone]  CC: Primary care physician; [name], MD

## 2016-08-28 NOTE — ED Triage Notes (Signed)
Patient presents to ED via POV in wheelchair from home with c/o generalized weakness x 2 weeks. Dialysis patient. Last treatment was on Tuesday. Patient was suppose to go today but did not go. Patient here with multiple complaints, SOB, runny nose, HA. When asked to pick one that is bothering him the most patient states, "being so weak". Patient afebrile in triage. Patient lethargic but responds quickly to verbal stimuli.

## 2016-08-29 ENCOUNTER — Observation Stay: Payer: Medicaid Other

## 2016-08-29 LAB — PHOSPHORUS: PHOSPHORUS: 9.3 mg/dL — AB (ref 2.5–4.6)

## 2016-08-29 MED ORDER — HYDRALAZINE HCL 25 MG PO TABS
25.0000 mg | ORAL_TABLET | Freq: Three times a day (TID) | ORAL | Status: DC
  Administered 2016-08-29 – 2016-08-30 (×2): 25 mg via ORAL
  Filled 2016-08-29 (×2): qty 1

## 2016-08-29 MED ORDER — IRBESARTAN 150 MG PO TABS
150.0000 mg | ORAL_TABLET | Freq: Every day | ORAL | Status: DC
  Administered 2016-08-29 – 2016-08-30 (×2): 150 mg via ORAL
  Filled 2016-08-29 (×4): qty 1

## 2016-08-29 MED ORDER — AMLODIPINE BESYLATE 10 MG PO TABS: 10.0000 mg | ORAL_TABLET | Freq: Every day | ORAL | 0 refills | Status: DC

## 2016-08-29 MED ORDER — AMLODIPINE BESYLATE 10 MG PO TABS
10.0000 mg | ORAL_TABLET | Freq: Every day | ORAL | Status: DC
  Administered 2016-08-29 – 2016-08-30 (×2): 10 mg via ORAL
  Filled 2016-08-29 (×2): qty 1

## 2016-08-29 MED ORDER — NEPRO/CARBSTEADY PO LIQD
237.0000 mL | Freq: Two times a day (BID) | ORAL | Status: DC
  Administered 2016-08-29: 237 mL via ORAL

## 2016-08-29 NOTE — Progress Notes (Signed)
Hemodialysis treatment started. 

## 2016-08-29 NOTE — Progress Notes (Signed)
Post hd tx 

## 2016-08-29 NOTE — Progress Notes (Signed)
[place] [facility] [place] NAME: Mario [name]    MR#:  [id number]  DATE OF BIRTH:  [birth year]  SUBJECTIVE:  CHIEF COMPLAINT:   Chief Complaint  Patient presents with  . Weakness   Still complains of some epigastric pain which is improving. No vomiting. Has nausea. Afebrile.  REVIEW OF SYSTEMS:    Review of Systems  Constitutional: Positive for malaise/fatigue. Negative for chills and fever.  HENT: Negative for sore throat.   Eyes: Negative for blurred vision, double vision and pain.  Respiratory: Negative for cough, hemoptysis, shortness of breath and wheezing.   Cardiovascular: Negative for chest pain, palpitations, orthopnea and leg swelling.  Gastrointestinal: Positive for abdominal pain and nausea. Negative for constipation, diarrhea, heartburn and vomiting.  Genitourinary: Negative for dysuria and hematuria.  Musculoskeletal: Negative for back pain and joint pain.  Skin: Negative for rash.  Neurological: Positive for weakness. Negative for sensory change, speech change, focal weakness and headaches.  Endo/Heme/Allergies: Does not bruise/bleed easily.  Psychiatric/Behavioral: Negative for depression. The patient is not nervous/anxious.     DRUG ALLERGIES:  No Known Allergies  VITALS:  Blood pressure (!) 194/69, pulse 80, temperature 97.9 F (36.6 C), temperature source Oral, resp. rate 18, height 5\' 4"  (1.626 m), weight 72.9 kg (160 lb 11.5 oz), SpO2 100 %.  PHYSICAL EXAMINATION:   Physical Exam  GENERAL:  46 y.o.-year-old patient lying in the bed with no acute distress.  EYES: Pupils equal, round, reactive to light and accommodation. No scleral icterus. Extraocular muscles intact.  HEENT: Head atraumatic, normocephalic. Oropharynx and nasopharynx clear.  NECK:  Supple, no jugular venous distention. No thyroid enlargement, no tenderness.  LUNGS: Normal breath sounds bilaterally, no wheezing, rales, rhonchi. No use of  accessory muscles of respiration.  CARDIOVASCULAR: S1, S2 normal. No murmurs, rubs, or gallops.  ABDOMEN: Soft, nondistended. Bowel sounds present. No organomegaly or mass. Epigastric tenderness EXTREMITIES: No cyanosis, clubbing or edema b/l.   Left upper extremity AV fistula NEUROLOGIC: Cranial nerves II through XII are intact. No focal Motor or sensory deficits b/l.   PSYCHIATRIC: The patient is alert and oriented x 3.  SKIN: No obvious rash, lesion, or ulcer.   LABORATORY PANEL:   CBC  Recent Labs Lab 08/28/16 1158  WBC 4.3  HGB 10.9*  HCT 33.3*  PLT 132*   ------------------------------------------------------------------------------------------------------------------ Chemistries   Recent Labs Lab 08/28/16 1158  NA 135  K 4.4  CL 98*  CO2 22  GLUCOSE 76  BUN 46*  CREATININE 9.48*  CALCIUM 9.0  AST 29  ALT 20  ALKPHOS 171*  BILITOT 1.3*   ------------------------------------------------------------------------------------------------------------------  Cardiac Enzymes No results for input(s): TROPONINI in the last 168 hours. ------------------------------------------------------------------------------------------------------------------  RADIOLOGY:  Dg Chest 2 View  Result Date: 08/28/2016 CLINICAL DATA:  Shortness of breath. EXAM: CHEST  2 VIEW COMPARISON:  Radiograph of July 03, 2016. FINDINGS: Stable cardiomediastinal silhouette. No pneumothorax or pleural effusion is noted. No acute pulmonary disease is noted. Bony thorax is unremarkable. IMPRESSION: No active cardiopulmonary disease. Electronically Signed   By: Marijo Conception, M.D.   On: 08/28/2016 13:01   US Paracentesis  Result Date: 08/29/2016 CLINICAL DATA:  Renal failure.  Recurrent symptomatic ascites. EXAM: ULTRASOUND GUIDED PARACENTESIS TECHNIQUE: The procedure, risks (including but not limited to bleeding, infection, organ damage ), benefits, and alternatives were explained to the patient  with the aid of a translator. Questions regarding the procedure were encouraged and answered. The patient understands and  consents to the procedure. Survey ultrasound of the abdomen was performed and an appropriate skin entry site in the right lateral abdomen was selected. Skin site was marked, prepped with chlorhexidine, and draped in usual sterile fashion, and infiltrated locally with 1% lidocaine. A Safe-T-Centesis sheath needle was advanced into the peritoneal space until fluid could be aspirated. The sheath was advanced and the needle removed. 2.9 L of clear yellowascites were aspirated. COMPLICATIONS: COMPLICATIONS none IMPRESSION: Technically successful ultrasound guided paracentesis, removing 2.9 L of ascites. Electronically Signed   By: Lucrezia Europe M.D.   On: 08/29/2016 11:37   US Abdomen Limited Ruq  Result Date: 08/28/2016 CLINICAL DATA:  History of pancreatitis, epigastric pain, nausea and vomiting for 3 days EXAM: US ABDOMEN LIMITED - RIGHT UPPER QUADRANT COMPARISON:  Ultrasound abdomen limited of 07/05/2016 FINDINGS: Gallbladder: The gallbladder is visualized and no gallstones are noted. There is no pain over the gallbladder with compression. Common bile duct: Diameter: The common bile duct is normal measuring 3.7 mm in diameter. Liver: The liver is inhomogeneous and echogenic with nodular contours consistent with changes of cirrhosis. No focal hepatic abnormality is seen. Incidental finding of moderate ascites is present. Also several right renal cysts are noted. What is seen of the right renal parenchyma appears echogenic suggesting chronic renal medical disease. IMPRESSION: 1. No gallstones. 2. Echogenic inhomogeneous liver with nodular contours most consistent with changes of cirrhosis. No focal abnormality. 3. Incidental moderate ascites. 4. Somewhat echogenic right renal parenchyma may indicate chronic renal medical disease. Correlate clinically. Electronically Signed   By: Ivar Drape M.D.    On: 08/28/2016 16:06     ASSESSMENT AND PLAN:   Mario [name]  is a 46 y.o. male with h/o ESRD on HD (TTS)and hypertension who presents for evaluation of generalized body aches and weakness. Patient reports one week of cough productive of yellow/white sputum. For the last 3 days he has had the generalized weakness, malaise, body aches, worsening cough, sore throat, headache, chills, vomiting, diarrhea,and subjective fever. His last dialysis was on Tuesday.  1. Abdominal pain likely due to mild pancreatitis or gastritis Start after alcohol binge. Improving. Continue PPI and as needed pain medication.  2. End-stage renal disease on hemodialysis nephrology consultation for in-house dialysis Discussed with Dr. Candiss Norse  3. Known history of alcohol-induced cirrhosis of liver with multiple times ultrasound-guided paracentesis in the past most recent done on 07/06/2016 3.6 L was removed. Repeat paracentesis today to 2.9 L removed.  4. Hypertension, accelerated Continue home medications. We will add Norvasc 10 mg. Change his lisinopril to irbesartan. Added hydralazine 25 mg 3 times a day.  5. DVT prophylaxis subcutaneous heparin  All the records are reviewed and case discussed with Care Management/Social Workerr. Management plans discussed with the patient, family and they are in agreement.  DVT Prophylaxis: SCDs  TOTAL TIME TAKING CARE OF THIS PATIENT: 35 minutes.   POSSIBLE D/C IN 1-2 DAYS, DEPENDING ON CLINICAL CONDITION.  Hillary Bow R M.D on 08/29/2016 at 2:27 PM  Between 7am to 6pm - Pager - [phone]  After 6pm go to [email or web address] - password [email or web address] [facility]  Office  [phone]  CC: Primary care physician; [name], MD  Note: This dictation was prepared with Dragon dictation along with smaller phrase technology. Any transcriptional errors that result from this process are unintentional.

## 2016-08-29 NOTE — Progress Notes (Signed)
Hemodialysis ended per patient request

## 2016-08-29 NOTE — Progress Notes (Signed)
Pre HD tx.Mario [name] bedside translating.

## 2016-08-29 NOTE — Discharge Instructions (Signed)
Resume diet and activity as before  No alcohol

## 2016-08-29 NOTE — Care Management (Signed)
Self pay patient.  Chronic HD.  HD info faxed to [facility] liaison.  PCP [facility].  Patient has been provided Applications to [facility] and Medication Management previous admissions.  RNCM follow for Medication needs

## 2016-08-29 NOTE — Progress Notes (Signed)
Post hd tx.Patient only completed 2.5hours of treatment per patient request to end tx early.Dr.Singh made aware.2.2liters net removal.PRN hydralazine given at end of treatment for systolic>180.

## 2016-08-29 NOTE — Progress Notes (Signed)
Pre-hd tx.Interpretor bedside translating.

## 2016-08-29 NOTE — Progress Notes (Signed)
Subjective:   Patient known to our practice from previous admissions Patient seen during dialysis Tolerating well   HEMODIALYSIS FLOWSHEET:  Blood Flow Rate (mL/min): 400 mL/min Arterial Pressure (mmHg): -120 mmHg Venous Pressure (mmHg): 160 mmHg Transmembrane Pressure (mmHg): 70 mmHg Ultrafiltration Rate (mL/min): 1230 mL/min Dialysate Flow Rate (mL/min): 800 ml/min Conductivity: Machine : 14 Conductivity: Machine : 14 Dialysis Fluid Bolus: Normal Saline Bolus Amount (mL): 250 mL Intra-Hemodialysis Comments: 2747ml...tx ende per patient request (Dr.Alfreda Hammad bedside made aware of hd discontinued)  He underwent ultrasound-guided paracentesis earlier today. 2.9 L of fluid was removed Patient states he is hungry. He has not had good food intake in the past 5 days  Objective:  Vital signs in last 24 hours:  Temp:  [97.3 F (36.3 C)-98 F (36.7 C)] 98 F (36.7 C) (01/26 1457) Pulse Rate:  [69-80] 78 (01/26 1457) Resp:  [17-20] 18 (01/26 1417) BP: (168-221)/(52-101) 192/52 (01/26 1457) SpO2:  [97 %-100 %] 100 % (01/26 1457) Weight:  [72.9 kg (160 lb 11.5 oz)-77.5 kg (170 lb 14.4 oz)] 72.9 kg (160 lb 11.5 oz) (01/26 1417)  Weight change:  Filed Weights   08/28/16 1714 08/29/16 1130 08/29/16 1417  Weight: 77.5 kg (170 lb 14.4 oz) 75.5 kg (166 lb 7.2 oz) 72.9 kg (160 lb 11.5 oz)    Intake/Output:    Intake/Output Summary (Last 24 hours) at 08/29/16 1501 Last data filed at 08/29/16 1417  Gross per 24 hour  Intake                0 ml  Output             2291 ml  Net            -2291 ml     Physical Exam: General: No acute distress, laying in the bed   HEENT Moist oral mucous membranes   Neck Supple   Pulm/lungs Normal effort, clear to auscultation   CVS/Heart Regular, no rub   Abdomen:  soft, nontender, distended   Extremities: Trace edema   Neurologic: Alert, able to follow commands   Skin: Dry   Access: Left upper arm AV fistula        Basic Metabolic  Panel:   Recent Labs Lab 08/28/16 1158 08/29/16 1139  NA 135  --   K 4.4  --   CL 98*  --   CO2 22  --   GLUCOSE 76  --   BUN 46*  --   CREATININE 9.48*  --   CALCIUM 9.0  --   PHOS  --  9.3*     CBC:  Recent Labs Lab 08/28/16 1158  WBC 4.3  HGB 10.9*  HCT 33.3*  MCV 98.0  PLT 132*      Microbiology:  Recent Results (from the past 720 hour(s))  MRSA PCR Screening     Status: None   Collection Time: 08/28/16  5:59 PM  Result Value Ref Range Status   MRSA by PCR NEGATIVE NEGATIVE Final    Comment:        The GeneXpert MRSA Assay (FDA approved for NASAL specimens only), is one component of a comprehensive MRSA colonization surveillance program. It is not intended to diagnose MRSA infection nor to guide or monitor treatment for MRSA infections.     Coagulation Studies: No results for input(s): LABPROT, INR in the last 72 hours.  Urinalysis: No results for input(s): COLORURINE, LABSPEC, PHURINE, GLUCOSEU, HGBUR, BILIRUBINUR, KETONESUR, PROTEINUR, UROBILINOGEN, NITRITE, LEUKOCYTESUR in the  last 72 hours.  Invalid input(s): APPERANCEUR    Imaging: Dg Chest 2 View  Result Date: 08/28/2016 CLINICAL DATA:  Shortness of breath. EXAM: CHEST  2 VIEW COMPARISON:  Radiograph of July 03, 2016. FINDINGS: Stable cardiomediastinal silhouette. No pneumothorax or pleural effusion is noted. No acute pulmonary disease is noted. Bony thorax is unremarkable. IMPRESSION: No active cardiopulmonary disease. Electronically Signed   By: Marijo Conception, M.D.   On: 08/28/2016 13:01   US Paracentesis  Result Date: 08/29/2016 CLINICAL DATA:  Renal failure.  Recurrent symptomatic ascites. EXAM: ULTRASOUND GUIDED PARACENTESIS TECHNIQUE: The procedure, risks (including but not limited to bleeding, infection, organ damage ), benefits, and alternatives were explained to the patient with the aid of a translator. Questions regarding the procedure were encouraged and answered. The  patient understands and consents to the procedure. Survey ultrasound of the abdomen was performed and an appropriate skin entry site in the right lateral abdomen was selected. Skin site was marked, prepped with chlorhexidine, and draped in usual sterile fashion, and infiltrated locally with 1% lidocaine. A Safe-T-Centesis sheath needle was advanced into the peritoneal space until fluid could be aspirated. The sheath was advanced and the needle removed. 2.9 L of clear yellowascites were aspirated. COMPLICATIONS: COMPLICATIONS none IMPRESSION: Technically successful ultrasound guided paracentesis, removing 2.9 L of ascites. Electronically Signed   By: Lucrezia Europe M.D.   On: 08/29/2016 11:37   US Abdomen Limited Ruq  Result Date: 08/28/2016 CLINICAL DATA:  History of pancreatitis, epigastric pain, nausea and vomiting for 3 days EXAM: US ABDOMEN LIMITED - RIGHT UPPER QUADRANT COMPARISON:  Ultrasound abdomen limited of 07/05/2016 FINDINGS: Gallbladder: The gallbladder is visualized and no gallstones are noted. There is no pain over the gallbladder with compression. Common bile duct: Diameter: The common bile duct is normal measuring 3.7 mm in diameter. Liver: The liver is inhomogeneous and echogenic with nodular contours consistent with changes of cirrhosis. No focal hepatic abnormality is seen. Incidental finding of moderate ascites is present. Also several right renal cysts are noted. What is seen of the right renal parenchyma appears echogenic suggesting chronic renal medical disease. IMPRESSION: 1. No gallstones. 2. Echogenic inhomogeneous liver with nodular contours most consistent with changes of cirrhosis. No focal abnormality. 3. Incidental moderate ascites. 4. Somewhat echogenic right renal parenchyma may indicate chronic renal medical disease. Correlate clinically. Electronically Signed   By: Ivar Drape M.D.   On: 08/28/2016 16:06     Medications:    . amLODipine  10 mg Oral Daily  . feeding  supplement (NEPRO CARB STEADY)  237 mL Oral BID BM  . heparin  5,000 Units Subcutaneous Q8H  . hydrALAZINE  25 mg Oral Q8H  . irbesartan  150 mg Oral Daily  . lanthanum  500 mg Oral Q supper  . metoprolol tartrate  25 mg Oral BID  . pneumococcal 23 valent vaccine  0.5 mL Intramuscular Tomorrow-1000   acetaminophen **OR** acetaminophen, bisacodyl, hydrALAZINE, morphine injection, ondansetron **OR** ondansetron (ZOFRAN) IV, senna-docusate, traMADol  Assessment/ Plan:  46 y.o. Hispanic male with end-stage renal disease, hypertension, Alcohol induced cirrhosis of the liver, ascites  [facility], [place]., Tuesday, Thursday, Saturday 1. End-stage renal disease- Patient seen during dialysis Ultrafiltration goal of 3000 cc as tolerated Patient ended his treatment prior to the prescribed time  2. Anemia of chronic kidney disease Current hemoglobin 10.9 We'll continue to monitor  3. Secondary hyperparathyroidism We will monitor phosphorus during his hospital stay. Current phosphorus level is 9.3 Patient  should be educated on low phosphorus diet We'll get dietitian evaluation  4. Ascites from cirrhosis of the liver Patient underwent paracentesis today. 2.9 L of fluid was removed   LOS: 0 Morrissa Shein 1/26/20183:01 PM

## 2016-08-30 LAB — HEPATITIS B SURFACE ANTIBODY, QUANTITATIVE: Hep B S AB Quant (Post): >1000 m[IU]/mL (ref >9.9)

## 2016-08-30 LAB — HEPATITIS B SURFACE ANTIGEN: Hepatitis B Surface Ag: NEGATIVE

## 2016-08-30 MED ORDER — IRBESARTAN 150 MG PO TABS: 150.0000 mg | ORAL_TABLET | Freq: Every day | ORAL | 2 refills | Status: DC

## 2016-08-30 MED ORDER — TRAMADOL HCL 50 MG PO TABS: 50.0000 mg | ORAL_TABLET | Freq: Two times a day (BID) | ORAL | 0 refills | Status: DC | PRN

## 2016-08-30 NOTE — Progress Notes (Signed)
Patient seen during dialysis Tolerating well   HEMODIALYSIS FLOWSHEET:  Blood Flow Rate (mL/min): 350 mL/min Arterial Pressure (mmHg): -160 mmHg Venous Pressure (mmHg): 210 mmHg Transmembrane Pressure (mmHg): 70 mmHg Ultrafiltration Rate (mL/min): 1000 mL/min Dialysate Flow Rate (mL/min): 800 ml/min Conductivity: Machine : 14 Conductivity: Machine : 14 Dialysis Fluid Bolus: Normal Saline Bolus Amount (mL): 250 mL Dialysate Change:  (3k 2.5ca) Intra-Hemodialysis Comments: uf off pt cramping. States "i dont want to do anymore, take me off., no mas"

## 2016-08-30 NOTE — Progress Notes (Signed)
Post HD assessment unchanged  

## 2016-08-30 NOTE — Care Management Note (Signed)
Case Management Note  Patient Details  Name: Mario [name] MRN: [id number] Date of Birth: [birth year]  Subjective/Objective:    As with his last [facility] admission, [name] did not wait for a MATCH coupon. He is a patient of [employer].                 Action/Plan:   Expected Discharge Date:  08/30/16               Expected Discharge Plan:     In-House Referral:     Discharge planning Services     Post Acute Care Choice:    Choice offered to:     DME Arranged:    DME Agency:     HH Arranged:    HH Agency:     Status of Service:     If discussed at [employer], dates discussed:    Additional Comments:  [name] A, RN 08/30/2016, 1:43 PM

## 2016-08-30 NOTE — Progress Notes (Signed)
Pre HD  

## 2016-08-30 NOTE — Progress Notes (Signed)
Patient chose to end HD early with 1 hour 29 minutes remaining. MD notified. No current complaints.

## 2016-08-30 NOTE — Progress Notes (Signed)
HD initiated without issue. Pt states he will only run 3 hours today. Discussed the importance of staying full treatment. Will notify MD.

## 2016-08-30 NOTE — Progress Notes (Signed)
IV was removed. Discharge instructions, follow-up appointments, and prescriptions were provided to the pt via interpreter. All questions answered. Pt stated he was able to pay for his medications. The pt was taken downstairs via wheelchair by RN.

## 2016-08-30 NOTE — Progress Notes (Signed)
Pre HD assessment  

## 2016-08-30 NOTE — Clinical Social Work Note (Signed)
CSW attempted to see for SBIRT. Patient had already discharged. CSW signing off.  Santiago Bumpers, MSW [employer] [phone]

## 2016-09-02 NOTE — Discharge Summary (Signed)
[facility] [place] NAME: Mario [name]    MR#:  [id number]  DATE OF BIRTH:  July 12, 1971  DATE OF ADMISSION:  08/28/2016   ADMITTING PHYSICIAN: Fritzi Mandes, MD  DATE OF DISCHARGE: 08/30/2016  2:14 PM  PRIMARY CARE PHYSICIAN: LATEEF, Inocente Salles, MD   ADMISSION DIAGNOSIS:   Pancreatitis [K85.90] Ascites [R18.8] Acute pancreatitis, unspecified complication status, unspecified pancreatitis type [K85.90]  DISCHARGE DIAGNOSIS:   Active Problems:   Abdominal pain   SECONDARY DIAGNOSIS:   Past Medical History:  Diagnosis Date  . Dialysis patient ([place])   . Hypertension   . Renal disorder   . Renal insufficiency     HOSPITAL COURSE:   Mario [name] a 46 y.o. malewith a known history of End stage renal disease due to hypertension on hemodialysis, history of chronic alcoholism with ascites, chronic, cytopenia comes to the emergency room with increasing shortness of breath was found to be in pulmonary edema.  *Acute On Chronic systolic CHF, EF 06%. -currently off oxygen and on room air. -Improved breathing with dialysis. Does not make much urine at baseline. -Continue with dialysis. Advised low-sodium diet and fluid restriction.  * Uncontrolled hypertension - improved after dialysis. -on irbesartan, norvasc, metoprolol - lisinopril discontinued  * Abdominal pain on admission- secondary to gastritis and ascites - tolerated solid diet well - also has Chronic alcoholism with h/oAscites - worsening abdominal distention -paracentesis done again this admission and almost 3L fluid taken out.- No evidence of Hepatic Encephalopathy. - PPI OTC recommended and advised to stop drinking alcohol  * Thrombocytopenia chronic - stable and will follow counts.  - due to ETOH liver disease.   *ESRD on hemodialysis -Nephrology consultation placed  -On Tuesday Thursday Saturday hemodialysis.   Patient is stable and so being  discharged home today.  DISCHARGE CONDITIONS:   Guarded  CONSULTS OBTAINED:   Treatment Team:  Murlean Iba, MD  DRUG ALLERGIES:   No Known Allergies DISCHARGE MEDICATIONS:   Allergies as of 08/30/2016   No Known Allergies     Medication List    STOP taking these medications   lisinopril 10 MG tablet Commonly known as:  PRINIVIL,ZESTRIL     TAKE these medications   amLODipine 10 MG tablet Commonly known as:  NORVASC Take 1 tablet (10 mg total) by mouth daily.   irbesartan 150 MG tablet Commonly known as:  AVAPRO Take 1 tablet (150 mg total) by mouth daily.   lanthanum 500 MG chewable tablet Commonly known as:  FOSRENOL Chew 500 mg by mouth daily.   metoprolol tartrate 25 MG tablet Commonly known as:  LOPRESSOR Take 1 tablet (25 mg total) by mouth 2 (two) times daily.   traMADol 50 MG tablet Commonly known as:  ULTRAM Take 1 tablet (50 mg total) by mouth every 12 (twelve) hours as needed for moderate pain.        DISCHARGE INSTRUCTIONS:   1. PCP f/u in 1 week 2. For dialysis in 2 days  DIET:   Renal diet  ACTIVITY:   Activity as tolerated  OXYGEN:   Home Oxygen: No.  Oxygen Delivery: room air  DISCHARGE LOCATION:   home   If you experience worsening of your admission symptoms, develop shortness of breath, life threatening emergency, suicidal or homicidal thoughts you must seek medical attention immediately by calling 911 or calling your MD immediately  if symptoms less severe.  You Must read complete instructions/literature along with all the possible  adverse reactions/side effects for all the Medicines you take and that have been prescribed to you. Take any new Medicines after you have completely understood and accpet all the possible adverse reactions/side effects.   Please note  You were cared for by a hospitalist during your hospital stay. If you have any questions about your discharge medications or the care you received while you  were in the hospital after you are discharged, you can call the unit and asked to speak with the hospitalist on call if the hospitalist that took care of you is not available. Once you are discharged, your primary care physician will handle any further medical issues. Please note that NO REFILLS for any discharge medications will be authorized once you are discharged, as it is imperative that you return to your primary care physician (or establish a relationship with a primary care physician if you do not have one) for your aftercare needs so that they can reassess your need for medications and monitor your lab values.    On the day of Discharge:  VITAL SIGNS:   Blood pressure (!) 144/70, pulse 71, temperature 97.9 F (36.6 C), temperature source Oral, resp. rate 15, height 5\' 4"  (1.626 m), weight 74.1 kg (163 lb 5.8 oz), SpO2 100 %.  PHYSICAL EXAMINATION:    GENERAL: 46 y.o.-year-old patient lying in the bed with no acute distress.  EYES: Pupils equal, round, reactive to light and accommodation. No scleral icterus. Extraocular muscles intact.  HEENT: Head atraumatic, normocephalic. Oropharynx and nasopharynx clear.  NECK: Supple, no jugular venous distention. No thyroid enlargement, no tenderness.  LUNGS: Normal breath sounds bilaterally, no wheezing, rales,rhonchi or crepitation. No use of accessory muscles of respiration. Decreased bibasilar breath sounds CARDIOVASCULAR: S1, S2 normal. No rubs, or gallops. 2/6 systolic murmur present. ABDOMEN: Soft, nontender, nondistended. Bowel sounds present. No organomegaly or mass.  EXTREMITIES: No pedal edema, cyanosis, or clubbing. Left arm fistula with good thrill present. NEUROLOGIC: Cranial nerves II through XII are intact. Muscle strength 5/5 in all extremities. Sensation intact. Gait not checked.  PSYCHIATRIC: The patient is alert and oriented x 3.  SKIN: No obvious rash, lesion, or ulcer.   DATA REVIEW:   CBC  Recent Labs Lab  08/28/16 1158  WBC 4.3  HGB 10.9*  HCT 33.3*  PLT 132*    Chemistries   Recent Labs Lab 08/28/16 1158  NA 135  K 4.4  CL 98*  CO2 22  GLUCOSE 76  BUN 46*  CREATININE 9.48*  CALCIUM 9.0  AST 29  ALT 20  ALKPHOS 171*  BILITOT 1.3*     Microbiology Results  Results for orders placed or performed during the hospital encounter of 08/28/16  MRSA PCR Screening     Status: None   Collection Time: 08/28/16  5:59 PM  Result Value Ref Range Status   MRSA by PCR NEGATIVE NEGATIVE Final    Comment:        The GeneXpert MRSA Assay (FDA approved for NASAL specimens only), is one component of a comprehensive MRSA colonization surveillance program. It is not intended to diagnose MRSA infection nor to guide or monitor treatment for MRSA infections.     RADIOLOGY:  No results found.   Management plans discussed with the patient, family and they are in agreement.  CODE STATUS:  Code Status History    Date Active Date Inactive Code Status Order ID Comments User Context   08/28/2016  4:58 PM 08/30/2016  5:19 PM Full Code [id number]  Fritzi Mandes, MD ED   07/03/2016  8:06 PM 07/06/2016  4:22 PM Full Code [id number]  Fritzi Mandes, MD ED   04/11/2016  6:57 PM 04/12/2016  7:16 AM Full Code [id number]  Vaughan Basta, MD Inpatient   01/25/2015 12:29 PM 01/28/2015  4:26 PM Full Code [id number]  Dustin Flock, MD Inpatient    Advance Directive Documentation   [place] Most Recent Value  Type of Advance Directive  Living will  Pre-existing out of facility DNR order (yellow form or pink MOST form)  No data  "MOST" Form in Place?  No data      TOTAL TIME TAKING CARE OF THIS PATIENT: 37 minutes.    Jolynne Spurgin M.D on 09/02/2016 at 4:16 PM  Between 7am to 6pm - Pager - [phone]  After 6pm go to [email or web address] - Proofreader  [facility]  Office  [phone]  CC: Primary care physician; [name], MD   Note: This dictation  was prepared with Dragon dictation along with smaller phrase technology. Any transcriptional errors that result from this process are unintentional.

## 2016-10-13 ENCOUNTER — Observation Stay
Admission: EM | Admit: 2016-10-13 | Discharge: 2016-10-14 | Disposition: A | Discharge status: Home / Self Care | Payer: Medicaid Other | Attending: Internal Medicine | Admitting: Internal Medicine

## 2016-10-13 ENCOUNTER — Encounter: Payer: Self-pay | Admitting: Emergency Medicine

## 2016-10-13 ENCOUNTER — Emergency Department: Payer: Medicaid Other

## 2016-10-13 DIAGNOSIS — K2900 Acute gastritis without bleeding: Principal | ICD-10-CM | POA: Insufficient documentation

## 2016-10-13 DIAGNOSIS — E875 Hyperkalemia: Secondary | ICD-10-CM | POA: Insufficient documentation

## 2016-10-13 DIAGNOSIS — F17210 Nicotine dependence, cigarettes, uncomplicated: Secondary | ICD-10-CM | POA: Diagnosis not present

## 2016-10-13 DIAGNOSIS — K766 Portal hypertension: Secondary | ICD-10-CM | POA: Diagnosis not present

## 2016-10-13 DIAGNOSIS — Z609 Problem related to social environment, unspecified: Secondary | ICD-10-CM | POA: Insufficient documentation

## 2016-10-13 DIAGNOSIS — Z833 Family history of diabetes mellitus: Secondary | ICD-10-CM | POA: Insufficient documentation

## 2016-10-13 DIAGNOSIS — R109 Unspecified abdominal pain: Secondary | ICD-10-CM | POA: Diagnosis present

## 2016-10-13 DIAGNOSIS — I8510 Secondary esophageal varices without bleeding: Secondary | ICD-10-CM | POA: Diagnosis not present

## 2016-10-13 DIAGNOSIS — N2581 Secondary hyperparathyroidism of renal origin: Secondary | ICD-10-CM | POA: Insufficient documentation

## 2016-10-13 DIAGNOSIS — I5042 Chronic combined systolic (congestive) and diastolic (congestive) heart failure: Secondary | ICD-10-CM | POA: Diagnosis not present

## 2016-10-13 DIAGNOSIS — G8929 Other chronic pain: Secondary | ICD-10-CM | POA: Diagnosis not present

## 2016-10-13 DIAGNOSIS — N492 Inflammatory disorders of scrotum: Secondary | ICD-10-CM | POA: Insufficient documentation

## 2016-10-13 DIAGNOSIS — I868 Varicose veins of other specified sites: Secondary | ICD-10-CM | POA: Insufficient documentation

## 2016-10-13 DIAGNOSIS — I700 Atherosclerosis of aorta: Secondary | ICD-10-CM | POA: Diagnosis not present

## 2016-10-13 DIAGNOSIS — Z992 Dependence on renal dialysis: Secondary | ICD-10-CM | POA: Diagnosis not present

## 2016-10-13 DIAGNOSIS — D571 Sickle-cell disease without crisis: Secondary | ICD-10-CM | POA: Diagnosis not present

## 2016-10-13 DIAGNOSIS — N186 End stage renal disease: Secondary | ICD-10-CM | POA: Insufficient documentation

## 2016-10-13 DIAGNOSIS — I10 Essential (primary) hypertension: Secondary | ICD-10-CM

## 2016-10-13 DIAGNOSIS — I132 Hypertensive heart and chronic kidney disease with heart failure and with stage 5 chronic kidney disease, or end stage renal disease: Secondary | ICD-10-CM | POA: Insufficient documentation

## 2016-10-13 DIAGNOSIS — D696 Thrombocytopenia, unspecified: Secondary | ICD-10-CM | POA: Insufficient documentation

## 2016-10-13 DIAGNOSIS — R112 Nausea with vomiting, unspecified: Secondary | ICD-10-CM

## 2016-10-13 DIAGNOSIS — I2510 Atherosclerotic heart disease of native coronary artery without angina pectoris: Secondary | ICD-10-CM | POA: Diagnosis not present

## 2016-10-13 DIAGNOSIS — Z841 Family history of disorders of kidney and ureter: Secondary | ICD-10-CM | POA: Insufficient documentation

## 2016-10-13 DIAGNOSIS — Z79899 Other long term (current) drug therapy: Secondary | ICD-10-CM | POA: Insufficient documentation

## 2016-10-13 DIAGNOSIS — Z8719 Personal history of other diseases of the digestive system: Secondary | ICD-10-CM | POA: Diagnosis not present

## 2016-10-13 DIAGNOSIS — Z9119 Patient's noncompliance with other medical treatment and regimen: Secondary | ICD-10-CM | POA: Insufficient documentation

## 2016-10-13 DIAGNOSIS — K2970 Gastritis, unspecified, without bleeding: Secondary | ICD-10-CM | POA: Insufficient documentation

## 2016-10-13 DIAGNOSIS — R188 Other ascites: Secondary | ICD-10-CM

## 2016-10-13 DIAGNOSIS — K7031 Alcoholic cirrhosis of liver with ascites: Secondary | ICD-10-CM | POA: Diagnosis not present

## 2016-10-13 DIAGNOSIS — D631 Anemia in chronic kidney disease: Secondary | ICD-10-CM | POA: Insufficient documentation

## 2016-10-13 DIAGNOSIS — I5032 Chronic diastolic (congestive) heart failure: Secondary | ICD-10-CM | POA: Diagnosis present

## 2016-10-13 DIAGNOSIS — K7460 Unspecified cirrhosis of liver: Secondary | ICD-10-CM | POA: Diagnosis present

## 2016-10-13 HISTORY — DX: Unspecified cirrhosis of liver: K74.60

## 2016-10-13 HISTORY — DX: Essential (primary) hypertension: I10

## 2016-10-13 HISTORY — DX: Chronic combined systolic (congestive) and diastolic (congestive) heart failure: I50.42

## 2016-10-13 LAB — COMPREHENSIVE METABOLIC PANEL
ALBUMIN: 3.8 g/dL (ref 3.5–5.0)
ALT: 14 U/L — AB (ref 17–63)
AST: 19 U/L (ref 15–41)
Alkaline Phosphatase: 155 U/L — ABNORMAL HIGH (ref 38–126)
Anion gap: 15 (ref 5–15)
BILIRUBIN TOTAL: 1 mg/dL (ref 0.3–1.2)
BUN: 54 mg/dL — AB (ref 6–20)
CO2: 19 mmol/L — ABNORMAL LOW (ref 22–32)
CREATININE: 11.61 mg/dL — AB (ref 0.61–1.24)
Calcium: 9.1 mg/dL (ref 8.9–10.3)
Chloride: 102 mmol/L (ref 101–111)
GFR calc Af Amer: 5 mL/min — ABNORMAL LOW (ref >60)
GFR, EST NON AFRICAN AMERICAN: 5 mL/min — AB (ref >60)
GLUCOSE: 99 mg/dL (ref 65–99)
Potassium: 5.3 mmol/L — ABNORMAL HIGH (ref 3.5–5.1)
Sodium: 136 mmol/L (ref 135–145)
TOTAL PROTEIN: 7.7 g/dL (ref 6.5–8.1)

## 2016-10-13 LAB — CBC
HEMATOCRIT: 35.9 % — AB (ref 40.0–52.0)
Hemoglobin: 11.9 g/dL — ABNORMAL LOW (ref 13.0–18.0)
MCH: 32.9 pg (ref 26.0–34.0)
MCHC: 33.2 g/dL (ref 32.0–36.0)
MCV: 99 fL (ref 80.0–100.0)
PLATELETS: 125 10*3/uL — AB (ref 150–440)
RBC: 3.63 MIL/uL — ABNORMAL LOW (ref 4.40–5.90)
RDW: 15.7 % — AB (ref 11.5–14.5)
WBC: 5.2 10*3/uL (ref 3.8–10.6)

## 2016-10-13 LAB — TROPONIN I
Troponin I: 0.05 ng/mL (ref <0.03)
Troponin I: 0.03 ng/mL (ref <0.03)

## 2016-10-13 LAB — BRAIN NATRIURETIC PEPTIDE: B NATRIURETIC PEPTIDE 5: 784 pg/mL — AB (ref 0.0–100.0)

## 2016-10-13 LAB — LIPASE, BLOOD: Lipase: 45 U/L (ref 11–51)

## 2016-10-13 MED ORDER — IRBESARTAN 75 MG PO TABS
150.0000 mg | ORAL_TABLET | Freq: Every day | ORAL | Status: DC
Start: 2016-10-13 — End: 2016-10-14
  Administered 2016-10-13: 150 mg via ORAL
  Filled 2016-10-13: qty 2

## 2016-10-13 MED ORDER — OXYCODONE HCL 5 MG PO TABS
5.0000 mg | ORAL_TABLET | ORAL | Status: DC | PRN
  Administered 2016-10-14 (×3): 5 mg via ORAL
  Filled 2016-10-13 (×3): qty 1

## 2016-10-13 MED ORDER — ACETAMINOPHEN 325 MG PO TABS: 650.0000 mg | ORAL_TABLET | Freq: Four times a day (QID) | ORAL | Status: DC | PRN

## 2016-10-13 MED ORDER — ONDANSETRON HCL 4 MG/2ML IJ SOLN
4.0000 mg | Freq: Once | INTRAMUSCULAR | Status: AC
  Administered 2016-10-13: 4 mg via INTRAVENOUS
  Filled 2016-10-13: qty 2

## 2016-10-13 MED ORDER — SODIUM CHLORIDE 0.9 % IV SOLN
80.0000 mg | Freq: Once | INTRAVENOUS | Status: AC
  Administered 2016-10-13: 19:00:00 80 mg via INTRAVENOUS
  Filled 2016-10-13: qty 80

## 2016-10-13 MED ORDER — HYDROMORPHONE HCL 1 MG/ML IJ SOLN
1.0000 mg | Freq: Once | INTRAMUSCULAR | Status: AC
  Administered 2016-10-13: 1 mg via INTRAVENOUS

## 2016-10-13 MED ORDER — MORPHINE SULFATE (PF) 4 MG/ML IV SOLN
4.0000 mg | Freq: Once | INTRAVENOUS | Status: AC
Start: 2016-10-13 — End: 2016-10-13
  Administered 2016-10-13: 4 mg via INTRAVENOUS

## 2016-10-13 MED ORDER — METOPROLOL TARTRATE 25 MG PO TABS
25.0000 mg | ORAL_TABLET | Freq: Two times a day (BID) | ORAL | Status: DC
  Administered 2016-10-13: 25 mg via ORAL
  Filled 2016-10-13: qty 1

## 2016-10-13 MED ORDER — PANTOPRAZOLE SODIUM 40 MG IV SOLR
40.0000 mg | Freq: Two times a day (BID) | INTRAVENOUS | Status: DC
  Administered 2016-10-13: 40 mg via INTRAVENOUS

## 2016-10-13 MED ORDER — LABETALOL HCL 5 MG/ML IV SOLN: 10.0000 mg | INTRAVENOUS | Status: DC | PRN

## 2016-10-13 MED ORDER — ONDANSETRON HCL 4 MG PO TABS: 4.0000 mg | ORAL_TABLET | Freq: Four times a day (QID) | ORAL | Status: DC | PRN

## 2016-10-13 MED ORDER — GI COCKTAIL ~~LOC~~
30.0000 mL | Freq: Once | ORAL | Status: AC
  Administered 2016-10-13: 30 mL via ORAL
  Filled 2016-10-13: qty 30

## 2016-10-13 MED ORDER — ACETAMINOPHEN 650 MG RE SUPP: 650.0000 mg | Freq: Four times a day (QID) | RECTAL | Status: DC | PRN

## 2016-10-13 MED ORDER — HYDROMORPHONE HCL 1 MG/ML PO LIQD: 1.0000 mg | Freq: Once | ORAL | Status: DC

## 2016-10-13 MED ORDER — IOPAMIDOL (ISOVUE-300) INJECTION 61%
100.0000 mL | Freq: Once | INTRAVENOUS | Status: AC | PRN
  Administered 2016-10-13: 100 mL via INTRAVENOUS

## 2016-10-13 MED ORDER — LANTHANUM CARBONATE 500 MG PO CHEW: 500.0000 mg | CHEWABLE_TABLET | Freq: Every day | ORAL | Status: DC

## 2016-10-13 MED ORDER — MORPHINE SULFATE (PF) 4 MG/ML IV SOLN
4.0000 mg | Freq: Once | INTRAVENOUS | Status: AC
Start: 2016-10-13 — End: 2016-10-13
  Administered 2016-10-13: 4 mg via INTRAVENOUS
  Filled 2016-10-13: qty 1

## 2016-10-13 MED ORDER — ONDANSETRON HCL 4 MG/2ML IJ SOLN: 4.0000 mg | Freq: Four times a day (QID) | INTRAMUSCULAR | Status: DC | PRN

## 2016-10-13 MED ORDER — HEPARIN SODIUM (PORCINE) 5000 UNIT/ML IJ SOLN
5000.0000 [IU] | Freq: Three times a day (TID) | INTRAMUSCULAR | Status: DC
  Administered 2016-10-13 – 2016-10-14 (×2): 5000 [IU] via SUBCUTANEOUS
  Filled 2016-10-13 (×2): qty 1

## 2016-10-13 MED ORDER — AMLODIPINE BESYLATE 10 MG PO TABS
10.0000 mg | ORAL_TABLET | Freq: Every day | ORAL | Status: DC
  Administered 2016-10-13 – 2016-10-14 (×2): 10 mg via ORAL
  Filled 2016-10-13 (×2): qty 1

## 2016-10-13 MED ORDER — SODIUM CHLORIDE 0.9% FLUSH
3.0000 mL | Freq: Two times a day (BID) | INTRAVENOUS | Status: DC
  Administered 2016-10-13: 3 mL via INTRAVENOUS

## 2016-10-13 MED ORDER — MORPHINE SULFATE (PF) 4 MG/ML IV SOLN
4.0000 mg | Freq: Once | INTRAVENOUS | Status: DC
  Filled 2016-10-13: qty 1

## 2016-10-13 MED ORDER — SODIUM CHLORIDE 0.9 % IV SOLN
8.0000 mg/h | INTRAVENOUS | Status: DC
  Administered 2016-10-13 – 2016-10-14 (×2): 8 mg/h via INTRAVENOUS
  Filled 2016-10-13 (×2): qty 80

## 2016-10-13 MED ORDER — HYDROMORPHONE HCL 1 MG/ML IJ SOLN
1.0000 mg | INTRAMUSCULAR | Status: DC | PRN
  Administered 2016-10-13 – 2016-10-14 (×3): 1 mg via INTRAVENOUS
  Filled 2016-10-13 (×3): qty 1

## 2016-10-13 NOTE — ED Notes (Signed)
Contacted pharmacy for Protonix medication.

## 2016-10-13 NOTE — ED Provider Notes (Signed)
[facility] Emergency Department Provider Note  ____________________________________________   First MD Initiated Contact with Patient 10/13/16 1529     (approximate)  I have reviewed the triage vital signs and the nursing notes.   HISTORY  Chief Complaint Abdominal Pain  Patient is Spanish-speaking only. Interpreter, Priscella Mann, present for interaction with patient.  HPI Mario [name] is a 46 y.o. male with a history of end-stage renal disease on dialysis as well as gastritis and pancreatitis who is presented with 3 days of upper abdominal pain. He says that the pain is constant and a 10 out of 10 at this time. He says that his body is aching all over. He says that he does not make urine. Says that he has been vomiting every morning. Says there is been no blood in his vomitus. Denies any diarrhea. Says that the pain does radiate through to his back. Says that he hasn't had anything to drink since January when he was last admitted for pancreatitis to the hospital. At that time he also required a paracentesis. He says that he had had previous paracenteses but months in the past. He denies any fever.   Past Medical History:  Diagnosis Date  . Dialysis patient ([place])   . Hypertension   . Renal disorder   . Renal insufficiency   . Sickle cell anemia [facility])     Patient Active Problem List   Diagnosis Date Noted  . Abdominal pain 08/28/2016  . Pulmonary edema 07/03/2016  . Fluid overload 04/12/2016  . Chest pain 04/11/2016  . Scrotal infection 01/25/2015  . ESRD (end stage renal disease) ([place]) 01/25/2015    Past Surgical History:  Procedure Laterality Date  . AV FISTULA PLACEMENT      Prior to Admission medications   Medication Sig Start Date End Date Taking? Authorizing Provider  amLODipine (NORVASC) 10 MG tablet Take 1 tablet (10 mg total) by mouth daily. Patient not taking: Reported on 10/13/2016 08/29/16   Hillary Bow, MD  irbesartan (AVAPRO)  150 MG tablet Take 1 tablet (150 mg total) by mouth daily. Patient not taking: Reported on 10/13/2016 08/30/16   Gladstone Lighter, MD  lanthanum (FOSRENOL) 500 MG chewable tablet Chew 500 mg by mouth daily.    Historical Provider, MD  metoprolol tartrate (LOPRESSOR) 25 MG tablet Take 1 tablet (25 mg total) by mouth 2 (two) times daily. Patient not taking: Reported on 10/13/2016 04/16/16   Henreitta Leber, MD  traMADol (ULTRAM) 50 MG tablet Take 1 tablet (50 mg total) by mouth every 12 (twelve) hours as needed for moderate pain. Patient not taking: Reported on 10/13/2016 08/30/16   Gladstone Lighter, MD     Allergies Patient has no known allergies.  Family History  Problem Relation Age of Onset  . Kidney failure Father   . Diabetes Mother     Social History Social History  Substance Use Topics  . Smoking status: Current Every Day Smoker    Packs/day: 0.25    Types: Cigarettes  . Smokeless tobacco: Never Used  . Alcohol use Yes     Comment: 1/2 bottle tequilla per day    Review of Systems Constitutional: No fever/chills Eyes: No visual changes. ENT: No sore throat. Cardiovascular: Denies chest pain. Respiratory: Denies shortness of breath. Gastrointestinal:  No diarrhea.  No constipation. Genitourinary: Negative for dysuria. Musculoskeletal: Negative for back pain. Skin: Negative for rash. Neurological: Negative for headaches, focal weakness or numbness.  10-point ROS otherwise negative.  ____________________________________________  PHYSICAL EXAM:  VITAL SIGNS: ED Triage Vitals  Enc Vitals Group     BP 10/13/16 1302 (!) 169/78     Pulse Rate 10/13/16 1302 71     Resp 10/13/16 1302 18     Temp 10/13/16 1311 97.8 F (36.6 C)     Temp Source 10/13/16 1311 Oral     SpO2 10/13/16 1302 100 %     Weight 10/13/16 1302 132 lb 4.4 oz (60 kg)     Height 10/13/16 1302 5\' 2"  (1.575 m)     Head Circumference --      Peak Flow --      Pain Score 10/13/16 1303 10     Pain  Loc --      Pain Edu? --      Excl. in [place]? --     Constitutional: Alert and oriented. Well appearing and in no acute distress. Eyes: Conjunctivae are normal. PERRL. EOMI. Head: Atraumatic. Nose: No congestion/rhinnorhea. Mouth/Throat: Mucous membranes are moist.  Neck: No stridor.   Cardiovascular: Normal rate, regular rhythm. Grossly normal heart sounds.  Good peripheral circulation with palpable thrill to the left upper extremity dialysis fistula. Respiratory: Normal respiratory effort.  No retractions. Lungs CTAB. Gastrointestinal: Mildly distended but not tense. Diffuse tenderness to palpation which is moderate to severe. Abdomen is soft. No CVA tenderness. Musculoskeletal: No lower extremity tenderness nor edema.  No joint effusions. Neurologic:  Normal speech and language. No gross focal neurologic deficits are appreciated.  Skin:  Skin is warm, dry and intact. No rash noted. Psychiatric: Mood and affect are normal. Speech and behavior are normal.  ____________________________________________   LABS (all labs ordered are listed, but only abnormal results are displayed)  Labs Reviewed  COMPREHENSIVE METABOLIC PANEL - Abnormal; Notable for the following:       Result Value   Potassium 5.3 (*)    CO2 19 (*)    BUN 54 (*)    Creatinine, Ser 11.61 (*)    ALT 14 (*)    Alkaline Phosphatase 155 (*)    GFR calc non Af Amer 5 (*)    GFR calc Af Amer 5 (*)    All other components within normal limits  CBC - Abnormal; Notable for the following:    RBC 3.63 (*)    Hemoglobin 11.9 (*)    HCT 35.9 (*)    RDW 15.7 (*)    Platelets 125 (*)    All other components within normal limits  TROPONIN I - Abnormal; Notable for the following:    Troponin I 0.05 (*)    All other components within normal limits  LIPASE, BLOOD   ____________________________________________  EKG  ED ECG REPORT I, Doran Stabler, the attending physician, personally viewed and interpreted this  ECG.   Date: 10/13/2016  EKG Time: 1602  Rate: 86  Rhythm: normal sinus rhythm  Axis: normal  Intervals:none  ST&T Change: No ST segment elevation or depression. No abnormal T-wave inversion. Weight peaking.  ____________________________________________  RADIOLOGY    DG Chest 1 View (Final result)  Result time 10/13/16 15:44:41  Final result by Lovey Newcomer, MD (10/13/16 15:44:41)           Narrative:   CLINICAL DATA: Patient with 3 days of abdominal pain.  EXAM: CHEST 1 VIEW  COMPARISON: Chest radiograph 08/28/2016.  FINDINGS: Stable cardiomegaly. Low lung volumes. No large area of pulmonary consolidation. No pleural effusion or pneumothorax. Surgical clips left axilla.  IMPRESSION: Cardiomegaly. No  acute cardiopulmonary process. Low lung volumes.   Electronically Signed By: Lovey Newcomer M.D. On: 10/13/2016 15:44           CT Abdomen Pelvis W Contrast (Final result)  Result time 10/13/16 18:35:21  Final result by Bella Kennedy, MD (10/13/16 18:35:21)           Narrative:   CLINICAL DATA: Epigastric abdominal pain. History of pancreatitis.  EXAM: CT ABDOMEN AND PELVIS WITH CONTRAST  TECHNIQUE: Multidetector CT imaging of the abdomen and pelvis was performed using the standard protocol following bolus administration of intravenous contrast.  CONTRAST: 160mL ISOVUE-300 IOPAMIDOL (ISOVUE-300) INJECTION 61%  COMPARISON: Abdominal ultrasound 08/28/2016  FINDINGS: Lower chest: Bibasilar atelectasis. Coronary artery atherosclerotic calcification.  Hepatobiliary: The liver is diffusely nodular and shrunken with relative hypertrophy of the caudate. No enhancing liver lesions are identified. Normal gallbladder. There is a moderate amount of perihepatic ascites.  Pancreas: Normal pancreatic contours and enhancement. No peripancreatic fluid collection or pancreatic ductal dilatation.  Spleen: The spleen is enlarged, measuring 16.6 cm in  craniocaudal dimension.  Adrenals/Urinary Tract: Normal adrenal glands. There is bilateral severe renal atrophy.  Stomach/Bowel: No abnormal bowel dilatation. No bowel wall thickening or adjacent fat stranding to indicate acute inflammation. Moderate ascites throughout the abdomen. Normal appendix.  Vascular/Lymphatic: There are large paraesophageal varices, a recannulized umbilical vein and small caliber. Splenic varices. The main portal vein, splenic vein and superior mesenteric vein are patent. The IVC is patent. The azygos vein is dilated and tortuous. There is calcific aortic atherosclerosis. No abdominal or pelvic lymphadenopathy.  Reproductive: Normal prostate and seminal vesicles. The scrotum is edematous.  Musculoskeletal: No lytic or blastic osseous lesion. Normal visualized extrathoracic and extraperitoneal soft tissues.  Other: No contributory non-categorized findings.  IMPRESSION: 1. Findings of hepatic cirrhosis and portal hypertension with splenomegaly, large caliber varices and moderate volume ascites. 2. Bilateral severe renal atrophy. 3. Calcific aortic and coronary artery atherosclerosis.   Electronically Signed By: Ulyses Jarred M.D. On: 10/13/2016 18:35           ____________________________________________   PROCEDURES  Procedure(s) performed:   Procedures  Critical Care performed:   ____________________________________________   INITIAL IMPRESSION / ASSESSMENT AND PLAN / ED COURSE  Pertinent labs & imaging results that were available during my care of the patient were reviewed by me and considered in my medical decision making (see chart for details).  ----------------------------------------- 5:06 PM on 10/13/2016 -----------------------------------------  And is only minimally relieved after morphine and GI cocktail. We will proceed with CAT scan of the abdomen and pelvis. I discussed this with the nephrologist, Dr. Holley Raring,  who gave the patient clearance for CAT scan at this time. Patient also with mildly elevated potassium. No changes evident on the EKG. We'll hold treatment for the time being.    ----------------------------------------- 7:07 PM on 10/13/2016 -----------------------------------------  Patient with CAT scan showing chronic conditions. ascites present. Doubt that the patient has spontaneous bacterial peritonitis. No fever. Pain is focal to the upper abdomen. Patient now still complaining of 8 out of 10 pain. Patient to be admitted to the hospital. Will start on Protonix drip. Suspecting possible gastritis versus ulcerative disease of the stomach or duodenum. He is understanding of this plan and willing to comply. Signed out to Dr. Margaretmary Eddy.    ____________________________________________   FINAL CLINICAL IMPRESSION(S) / ED DIAGNOSES  Intractable abdominal pain. Nausea and vomiting.    NEW MEDICATIONS STARTED DURING THIS VISIT:  New Prescriptions   No medications on file  Note:  This document was prepared using Dragon voice recognition software and may include unintentional dictation errors.    Orbie Pyo, MD 10/13/16 Einar Crow

## 2016-10-13 NOTE — Progress Notes (Signed)
Used interpreter for admission information, education, medications, fall prevention education, and answered all questions.

## 2016-10-13 NOTE — H&P (Signed)
[facility] NAME: Mathius Birkeland    MR#:  [id number]  DATE OF BIRTH:  1970/08/30  DATE OF ADMISSION:  10/13/2016  PRIMARY CARE PHYSICIAN: [name], MD   REQUESTING/REFERRING PHYSICIAN: Clearnce Hasten, MD  CHIEF COMPLAINT:   Chief Complaint  Patient presents with  . Abdominal Pain    HISTORY OF PRESENT ILLNESS:  Yunior Jain  is a 46 y.o. male who presents with Epigastric abdominal pain. Patient states he is had this pain for 3 days. He states this is similar to pain he had during a prior hospitalization here couple months ago. On chart review, it seems that during that hospitalization he required paracentesis due to fair amount of ascites. Laboratory workup in the ED is largely unremarkable. Given his persistent pain, he was started on PPI drip with some concern for possible PUD, and hospitalists were called for admission.  PAST MEDICAL HISTORY:   Past Medical History:  Diagnosis Date  . Chronic combined systolic and diastolic CHF (congestive heart failure) ([place])   . Cirrhosis ([place])   . Dialysis patient ([place])   . Hypertension   . Renal disorder   . Renal insufficiency   . Sickle cell anemia ([facility])     PAST SURGICAL HISTORY:   Past Surgical History:  Procedure Laterality Date  . AV FISTULA PLACEMENT      SOCIAL HISTORY:   Social History  Substance Use Topics  . Smoking status: Current Every Day Smoker    Packs/day: 0.25    Types: Cigarettes  . Smokeless tobacco: Never Used  . Alcohol use Yes     Comment: 1/2 bottle tequilla per day    FAMILY HISTORY:   Family History  Problem Relation Age of Onset  . Kidney failure Father   . Diabetes Mother     DRUG ALLERGIES:  No Known Allergies  MEDICATIONS AT HOME:   Prior to Admission medications   Medication Sig Start Date End Date Taking? Authorizing Provider  amLODipine (NORVASC) 10 MG tablet Take 1 tablet (10 mg total) by mouth  daily. Patient not taking: Reported on 10/13/2016 08/29/16   Hillary Bow, MD  irbesartan (AVAPRO) 150 MG tablet Take 1 tablet (150 mg total) by mouth daily. Patient not taking: Reported on 10/13/2016 08/30/16   Gladstone Lighter, MD  lanthanum (FOSRENOL) 500 MG chewable tablet Chew 500 mg by mouth daily.    Historical Provider, MD  metoprolol tartrate (LOPRESSOR) 25 MG tablet Take 1 tablet (25 mg total) by mouth 2 (two) times daily. Patient not taking: Reported on 10/13/2016 04/16/16   Henreitta Leber, MD  traMADol (ULTRAM) 50 MG tablet Take 1 tablet (50 mg total) by mouth every 12 (twelve) hours as needed for moderate pain. Patient not taking: Reported on 10/13/2016 08/30/16   Gladstone Lighter, MD    REVIEW OF SYSTEMS:  Review of Systems  Constitutional: Negative for chills, fever, malaise/fatigue and weight loss.  HENT: Negative for ear pain, hearing loss and tinnitus.   Eyes: Negative for blurred vision, double vision, pain and redness.  Respiratory: Negative for cough, hemoptysis and shortness of breath.   Cardiovascular: Negative for chest pain, palpitations, orthopnea and leg swelling.  Gastrointestinal: Positive for abdominal pain. Negative for constipation, diarrhea, nausea and vomiting.  Genitourinary: Negative for dysuria, frequency and hematuria.  Musculoskeletal: Negative for back pain, joint pain and neck pain.  Skin:       No acne, rash, or lesions  Neurological: Negative  for dizziness, tremors, focal weakness and weakness.  Endo/Heme/Allergies: Negative for polydipsia. Does not bruise/bleed easily.  Psychiatric/Behavioral: Negative for depression. The patient is not nervous/anxious and does not have insomnia.      VITAL SIGNS:   Vitals:   10/13/16 1302 10/13/16 1311 10/13/16 1746 10/13/16 1911  BP: (!) 169/78  (!) 187/100 (!) 182/91  Pulse: 71  77 88  Resp: 18  (!) 24 20  Temp:  97.8 F (36.6 C)    TempSrc:  Oral    SpO2: 100%   98%  Weight: 60 kg (132 lb 4.4 oz)      Height: 5\' 2"  (1.575 m)      Wt Readings from Last 3 Encounters:  10/13/16 60 kg (132 lb 4.4 oz)  08/30/16 74.1 kg (163 lb 5.8 oz)  07/05/16 73.5 kg (162 lb 0.6 oz)    PHYSICAL EXAMINATION:  Physical Exam  Vitals reviewed. Constitutional: He is oriented to person, place, and time. He appears well-developed and well-nourished. No distress.  HENT:  Head: Normocephalic and atraumatic.  Mouth/Throat: Oropharynx is clear and moist.  Eyes: Conjunctivae and EOM are normal. Pupils are equal, round, and reactive to light. No scleral icterus.  Neck: Normal range of motion. Neck supple. No JVD present. No thyromegaly present.  Cardiovascular: Normal rate, regular rhythm and intact distal pulses.  Exam reveals no gallop and no friction rub.   No murmur heard. Respiratory: Effort normal and breath sounds normal. No respiratory distress. He has no wheezes. He has no rales.  GI: Soft. Bowel sounds are normal. He exhibits distension (Mild to moderate). There is tenderness.  Positive fluid wave  Musculoskeletal: Normal range of motion. He exhibits no edema.  No arthritis, no gout  Lymphadenopathy:    He has no cervical adenopathy.  Neurological: He is alert and oriented to person, place, and time. No cranial nerve deficit.  No dysarthria, no aphasia  Skin: Skin is warm and dry. No rash noted. No erythema.  Psychiatric: He has a normal mood and affect. His behavior is normal. Judgment and thought content normal.    LABORATORY PANEL:   CBC  Recent Labs Lab 10/13/16 1307  WBC 5.2  HGB 11.9*  HCT 35.9*  PLT 125*   ------------------------------------------------------------------------------------------------------------------  Chemistries   Recent Labs Lab 10/13/16 1307  NA 136  K 5.3*  CL 102  CO2 19*  GLUCOSE 99  BUN 54*  CREATININE 11.61*  CALCIUM 9.1  AST 19  ALT 14*  ALKPHOS 155*  BILITOT 1.0    ------------------------------------------------------------------------------------------------------------------  Cardiac Enzymes  Recent Labs Lab 10/13/16 1919  TROPONINI 0.03*   ------------------------------------------------------------------------------------------------------------------  RADIOLOGY:  Dg Chest 1 View  Result Date: 10/13/2016 CLINICAL DATA:  Patient with 3 days of abdominal pain. EXAM: CHEST 1 VIEW COMPARISON:  Chest radiograph 08/28/2016. FINDINGS: Stable cardiomegaly. Low lung volumes. No large area of pulmonary consolidation. No pleural effusion or pneumothorax. Surgical clips left axilla. IMPRESSION: Cardiomegaly.  No acute cardiopulmonary process.  Low lung volumes. Electronically Signed   By: Lovey Newcomer M.D.   On: 10/13/2016 15:44   Ct Abdomen Pelvis W Contrast  Result Date: 10/13/2016 CLINICAL DATA:  Epigastric abdominal pain.  History of pancreatitis. EXAM: CT ABDOMEN AND PELVIS WITH CONTRAST TECHNIQUE: Multidetector CT imaging of the abdomen and pelvis was performed using the standard protocol following bolus administration of intravenous contrast. CONTRAST:  125mL ISOVUE-300 IOPAMIDOL (ISOVUE-300) INJECTION 61% COMPARISON:  Abdominal ultrasound 08/28/2016 FINDINGS: Lower chest: Bibasilar atelectasis. Coronary artery atherosclerotic calcification. Hepatobiliary: The  liver is diffusely nodular and shrunken with relative hypertrophy of the caudate. No enhancing liver lesions are identified. Normal gallbladder. There is a moderate amount of perihepatic ascites. Pancreas: Normal pancreatic contours and enhancement. No peripancreatic fluid collection or pancreatic ductal dilatation. Spleen: The spleen is enlarged, measuring 16.6 cm in craniocaudal dimension. Adrenals/Urinary Tract: Normal adrenal glands. There is bilateral severe renal atrophy. Stomach/Bowel: No abnormal bowel dilatation. No bowel wall thickening or adjacent fat stranding to indicate acute  inflammation. Moderate ascites throughout the abdomen. Normal appendix. Vascular/Lymphatic: There are large paraesophageal varices, a recannulized umbilical vein and small caliber. Splenic varices. The main portal vein, splenic vein and superior mesenteric vein are patent. The IVC is patent. The azygos vein is dilated and tortuous. There is calcific aortic atherosclerosis. No abdominal or pelvic lymphadenopathy. Reproductive: Normal prostate and seminal vesicles. The scrotum is edematous. Musculoskeletal: No lytic or blastic osseous lesion. Normal visualized extrathoracic and extraperitoneal soft tissues. Other: No contributory non-categorized findings. IMPRESSION: 1. Findings of hepatic cirrhosis and portal hypertension with splenomegaly, large caliber varices and moderate volume ascites. 2. Bilateral severe renal atrophy. 3. Calcific aortic and coronary artery atherosclerosis. Electronically Signed   By: Ulyses Jarred M.D.   On: 10/13/2016 18:35    EKG:   Orders placed or performed during the hospital encounter of 10/13/16  . ED EKG  . ED EKG  . EKG 12-Lead  . EKG 12-Lead    IMPRESSION AND PLAN:  Principal Problem:   Abdominal pain - unclear etiology. Potentially due to ascites, however he does state that the pain is very localized to his epigastric region. Cardiac workup is largely unremarkable, with troponin barely elevated which is his baseline, and BNP down from previous values. Lipase was within normal limits. He denies any hematemesis or melena.  PPI drip, when necessary analgesia, GI consult Active Problems:   ESRD (end stage renal disease) on dialysis [facility]) - nephrology consult for dialysis support   Chronic combined systolic and diastolic CHF (congestive heart failure) ([place]) - not an exacerbation, continue home meds   Cirrhosis ([place]) - avoid hepatotoxins   HTN (hypertension) - continue home meds  All the records are reviewed and case discussed with ED provider. Management plans  discussed with the patient and/or family.  DVT PROPHYLAXIS: SubQ heparin  GI PROPHYLAXIS: PPI  ADMISSION STATUS: Observation  CODE STATUS: Full Code Status History    Date Active Date Inactive Code Status Order ID Comments User Context   08/28/2016  4:58 PM 08/30/2016  5:19 PM Full Code [id number]  Fritzi Mandes, MD ED   07/03/2016  8:06 PM 07/06/2016  4:22 PM Full Code [id number]  Fritzi Mandes, MD ED   04/11/2016  6:57 PM 04/12/2016  7:16 AM Full Code [id number]  Vaughan Basta, MD Inpatient   01/25/2015 12:29 PM 01/28/2015  4:26 PM Full Code [id number]  Dustin Flock, MD Inpatient      TOTAL TIME TAKING CARE OF THIS PATIENT: 40 minutes.    Itai Barbian [place] 10/13/2016, 8:40 PM  [facility]  Office  [phone]  CC: Primary care physician; [name], MD

## 2016-10-13 NOTE — ED Triage Notes (Signed)
Abdominal pain x 3 days, history of pancreatitis, points to epigastric region.

## 2016-10-14 ENCOUNTER — Observation Stay: Payer: Medicaid Other

## 2016-10-14 DIAGNOSIS — R1010 Upper abdominal pain, unspecified: Secondary | ICD-10-CM

## 2016-10-14 DIAGNOSIS — K7031 Alcoholic cirrhosis of liver with ascites: Secondary | ICD-10-CM

## 2016-10-14 LAB — PROTEIN, PLEURAL OR PERITONEAL FLUID: TOTAL PROTEIN, FLUID: 3.8 g/dL

## 2016-10-14 LAB — BODY FLUID CELL COUNT WITH DIFFERENTIAL
EOS FL: 0 %
LYMPHS FL: 11 %
Monocyte-Macrophage-Serous Fluid: 81 %
NEUTROPHIL FLUID: 8 %
Total Nucleated Cell Count, Fluid: 697 cu mm

## 2016-10-14 LAB — BILIRUBIN, TOTAL: Total Bilirubin: 1.4 mg/dL — ABNORMAL HIGH (ref 0.3–1.2)

## 2016-10-14 LAB — BASIC METABOLIC PANEL
ANION GAP: 13 (ref 5–15)
BUN: 60 mg/dL — ABNORMAL HIGH (ref 6–20)
CALCIUM: 9 mg/dL (ref 8.9–10.3)
CHLORIDE: 99 mmol/L — AB (ref 101–111)
CO2: 19 mmol/L — AB (ref 22–32)
Creatinine, Ser: 11.78 mg/dL — ABNORMAL HIGH (ref 0.61–1.24)
GFR calc non Af Amer: 4 mL/min — ABNORMAL LOW (ref >60)
GFR, EST AFRICAN AMERICAN: 5 mL/min — AB (ref >60)
Glucose, Bld: 91 mg/dL (ref 65–99)
Potassium: 6 mmol/L — ABNORMAL HIGH (ref 3.5–5.1)
Sodium: 131 mmol/L — ABNORMAL LOW (ref 135–145)

## 2016-10-14 LAB — TROPONIN I
TROPONIN I: 0.03 ng/mL — AB (ref <0.03)
Troponin I: 0.03 ng/mL (ref <0.03)

## 2016-10-14 LAB — CBC
HCT: 34.3 % — ABNORMAL LOW (ref 40.0–52.0)
HEMOGLOBIN: 11.4 g/dL — AB (ref 13.0–18.0)
MCH: 32.2 pg (ref 26.0–34.0)
MCHC: 33.1 g/dL (ref 32.0–36.0)
MCV: 97.3 fL (ref 80.0–100.0)
Platelets: 121 10*3/uL — ABNORMAL LOW (ref 150–440)
RBC: 3.53 MIL/uL — AB (ref 4.40–5.90)
RDW: 16 % — ABNORMAL HIGH (ref 11.5–14.5)
WBC: 4.7 10*3/uL (ref 3.8–10.6)

## 2016-10-14 LAB — POTASSIUM: Potassium: 3.2 mmol/L — ABNORMAL LOW (ref 3.5–5.1)

## 2016-10-14 LAB — MRSA PCR SCREENING: MRSA BY PCR: NEGATIVE

## 2016-10-14 LAB — ALBUMIN, PLEURAL OR PERITONEAL FLUID: Albumin, Fluid: 2.1 g/dL

## 2016-10-14 LAB — PHOSPHORUS: PHOSPHORUS: 10.8 mg/dL — AB (ref 2.5–4.6)

## 2016-10-14 MED ORDER — PANTOPRAZOLE SODIUM 40 MG PO TBEC
40.0000 mg | DELAYED_RELEASE_TABLET | Freq: Every day | ORAL | Status: DC
  Administered 2016-10-14: 40 mg via ORAL
  Filled 2016-10-14 (×3): qty 1

## 2016-10-14 MED ORDER — SODIUM CHLORIDE 0.9 % IV SOLN: 100.0000 mL | INTRAVENOUS | Status: DC | PRN

## 2016-10-14 MED ORDER — LIDOCAINE HCL (PF) 1 % IJ SOLN
5.0000 mL | INTRAMUSCULAR | Status: DC | PRN
  Filled 2016-10-14: qty 5

## 2016-10-14 MED ORDER — HEPARIN SODIUM (PORCINE) 1000 UNIT/ML DIALYSIS
1000.0000 [IU] | INTRAMUSCULAR | Status: DC | PRN
  Filled 2016-10-14: qty 1

## 2016-10-14 MED ORDER — CALCIUM ACETATE (PHOS BINDER) 667 MG PO CAPS: 1334.0000 mg | ORAL_CAPSULE | Freq: Three times a day (TID) | ORAL | Status: DC

## 2016-10-14 MED ORDER — LOSARTAN POTASSIUM 50 MG PO TABS
100.0000 mg | ORAL_TABLET | Freq: Every day | ORAL | Status: DC
  Administered 2016-10-14: 100 mg via ORAL
  Filled 2016-10-14: qty 2

## 2016-10-14 MED ORDER — PANTOPRAZOLE SODIUM 40 MG PO TBEC: 40.0000 mg | DELAYED_RELEASE_TABLET | Freq: Every day | ORAL | 0 refills | Status: DC

## 2016-10-14 MED ORDER — CALCIUM ACETATE (PHOS BINDER) 667 MG PO CAPS: 1334.0000 mg | ORAL_CAPSULE | Freq: Three times a day (TID) | ORAL | 0 refills | Status: DC

## 2016-10-14 MED ORDER — PENTAFLUOROPROP-TETRAFLUOROETH EX AERO
1.0000 "application " | INHALATION_SPRAY | CUTANEOUS | Status: DC | PRN
  Filled 2016-10-14: qty 30

## 2016-10-14 MED ORDER — LOSARTAN POTASSIUM 100 MG PO TABS: 100.0000 mg | ORAL_TABLET | Freq: Every day | ORAL | 1 refills | Status: DC

## 2016-10-14 MED ORDER — ALTEPLASE 2 MG IJ SOLR: 2.0000 mg | Freq: Once | INTRAMUSCULAR | Status: DC | PRN

## 2016-10-14 MED ORDER — LIDOCAINE-PRILOCAINE 2.5-2.5 % EX CREA
1.0000 "application " | TOPICAL_CREAM | CUTANEOUS | Status: DC | PRN
  Filled 2016-10-14: qty 5

## 2016-10-14 NOTE — Progress Notes (Signed)
[facility] [place] NAME: Mario [name]    MR#:  [id number]  DATE OF BIRTH:  1971/03/13  SUBJECTIVE:   Came in with epigastric discomfort and vomiting 1. No hematemesis. Seen in hemodialysis with Spanish and computer. Patient apparently has been noncompliant with all his meds not able to obtain and afford to get it to social situation REVIEW OF SYSTEMS:   Review of Systems  Constitutional: Negative for chills, fever and weight loss.  HENT: Negative for ear discharge, ear pain and nosebleeds.   Eyes: Negative for blurred vision, pain and discharge.  Respiratory: Negative for sputum production, shortness of breath, wheezing and stridor.   Cardiovascular: Negative for chest pain, palpitations, orthopnea and PND.  Gastrointestinal: Positive for abdominal pain. Negative for diarrhea, nausea and vomiting.  Genitourinary: Negative for frequency and urgency.  Musculoskeletal: Negative for back pain and joint pain.  Neurological: Positive for weakness. Negative for sensory change, speech change and focal weakness.  Psychiatric/Behavioral: Negative for depression and hallucinations. The patient is not nervous/anxious.    Tolerating PT: not needed  DRUG ALLERGIES:  No Known Allergies  VITALS:  Blood pressure (!) 148/73, pulse 72, temperature 98 F (36.7 C), temperature source Oral, resp. rate 18, height 5\' 2"  (1.575 m), weight 78.5 kg (173 lb), SpO2 100 %.  PHYSICAL EXAMINATION:   Physical Exam  GENERAL:  46 y.o.-year-old patient lying in the bed with no acute distress. Chronically ill EYES: Pupils equal, round, reactive to light and accommodation. No scleral icterus. Extraocular muscles intact.  HEENT: Head atraumatic, normocephalic. Oropharynx and nasopharynx clear.  NECK:  Supple, no jugular venous distention. No thyroid enlargement, no tenderness.  LUNGS: Normal breath sounds bilaterally, no wheezing, rales, rhonchi. No use of  accessory muscles of respiration.  CARDIOVASCULAR: S1, S2 normal. No murmurs, rubs, or gallops.  ABDOMEN: Soft, nontender, nondistended. Bowel sounds present. No organomegaly or mass.  EXTREMITIES: No cyanosis, clubbing or edema b/l.    NEUROLOGIC: Cranial nerves II through XII are intact. No focal Motor or sensory deficits b/l.   PSYCHIATRIC:  patient is alert and oriented x 3.  SKIN: No obvious rash, lesion, or ulcer.   LABORATORY PANEL:  CBC  Recent Labs Lab 10/14/16 0153  WBC 4.7  HGB 11.4*  HCT 34.3*  PLT 121*    Chemistries   Recent Labs Lab 10/13/16 1307 10/14/16 0153 10/14/16 1342  NA 136 131*  --   K 5.3* 6.0* 3.2*  CL 102 99*  --   CO2 19* 19*  --   GLUCOSE 99 91  --   BUN 54* 60*  --   CREATININE 11.61* 11.78*  --   CALCIUM 9.1 9.0  --   AST 19  --   --   ALT 14*  --   --   ALKPHOS 155*  --   --   BILITOT 1.0  --   --    Cardiac Enzymes  Recent Labs Lab 10/14/16 0748  TROPONINI 0.03*   RADIOLOGY:  Dg Chest 1 View  Result Date: 10/13/2016 CLINICAL DATA:  Patient with 3 days of abdominal pain. EXAM: CHEST 1 VIEW COMPARISON:  Chest radiograph 08/28/2016. FINDINGS: Stable cardiomegaly. Low lung volumes. No large area of pulmonary consolidation. No pleural effusion or pneumothorax. Surgical clips left axilla. IMPRESSION: Cardiomegaly.  No acute cardiopulmonary process.  Low lung volumes. Electronically Signed   By: Lovey Newcomer M.D.   On: 10/13/2016 15:44   Ct Abdomen Pelvis  W Contrast  Result Date: 10/13/2016 CLINICAL DATA:  Epigastric abdominal pain.  History of pancreatitis. EXAM: CT ABDOMEN AND PELVIS WITH CONTRAST TECHNIQUE: Multidetector CT imaging of the abdomen and pelvis was performed using the standard protocol following bolus administration of intravenous contrast. CONTRAST:  176mL ISOVUE-300 IOPAMIDOL (ISOVUE-300) INJECTION 61% COMPARISON:  Abdominal ultrasound 08/28/2016 FINDINGS: Lower chest: Bibasilar atelectasis. Coronary artery  atherosclerotic calcification. Hepatobiliary: The liver is diffusely nodular and shrunken with relative hypertrophy of the caudate. No enhancing liver lesions are identified. Normal gallbladder. There is a moderate amount of perihepatic ascites. Pancreas: Normal pancreatic contours and enhancement. No peripancreatic fluid collection or pancreatic ductal dilatation. Spleen: The spleen is enlarged, measuring 16.6 cm in craniocaudal dimension. Adrenals/Urinary Tract: Normal adrenal glands. There is bilateral severe renal atrophy. Stomach/Bowel: No abnormal bowel dilatation. No bowel wall thickening or adjacent fat stranding to indicate acute inflammation. Moderate ascites throughout the abdomen. Normal appendix. Vascular/Lymphatic: There are large paraesophageal varices, a recannulized umbilical vein and small caliber. Splenic varices. The main portal vein, splenic vein and superior mesenteric vein are patent. The IVC is patent. The azygos vein is dilated and tortuous. There is calcific aortic atherosclerosis. No abdominal or pelvic lymphadenopathy. Reproductive: Normal prostate and seminal vesicles. The scrotum is edematous. Musculoskeletal: No lytic or blastic osseous lesion. Normal visualized extrathoracic and extraperitoneal soft tissues. Other: No contributory non-categorized findings. IMPRESSION: 1. Findings of hepatic cirrhosis and portal hypertension with splenomegaly, large caliber varices and moderate volume ascites. 2. Bilateral severe renal atrophy. 3. Calcific aortic and coronary artery atherosclerosis. Electronically Signed   By: Ulyses Jarred M.D.   On: 10/13/2016 18:35   ASSESSMENT AND PLAN:  Mario [name] a 46 y.o. malewith a known history of End stage renal disease due to hypertension on hemodialysis, history of chronic alcoholism with ascites, chronic, cytopenia comes to the emergency room with Mid epigastric pain and vomiting 1. Patient denies any hematemesis.   * Epigastric  Abdominal pain on admission- secondary to gastritis and ascites -Patient has a very poor social situation. He is unable to access medication. He has not been taking his PPIs. -No hematemesis. His CT of the abdomen is negative. GI consult appreciated. -We'll place him back on PPI daily - also has h/o Chronic alcoholism with h/oAscites - worsening abdominal distention---try get ultrasound-guided therapeutic paracentesis - No evidence of Hepatic Encephalopathy.  * Thrombocytopenia chronic - stable and will follow counts.  - due to chronic ETOH liver disease.   *ESRD on hemodialysis -Nephrology consultation placed  -On Tuesday Thursday Saturday hemodialysis.   *h/o HTN -resumed home meds -Patient has not been a chronic taking his blood pressure meds and affect any of the meds due to his capability of taking medications and his social situation.  Spoke with Care manager to see if there is any way we can get medications prescribed to patient. Patient has been 4 times admitted in the hospital the last 6 months for similar reasons  Case discussed with Care Management/Social Worker. Management plans discussed with the patient  and they are in agreement.  CODE STATUS: full  DVT Prophylaxis: heparin  TOTAL TIME TAKING CARE OF THIS PATIENT: 30 minutes.  >50% time spent on counselling and coordination of care  POSSIBLE D/C IN 1-2 DAYS, DEPENDING ON CLINICAL CONDITION.  Note: This dictation was prepared with Dragon dictation along with smaller phrase technology. Any transcriptional errors that result from this process are unintentional.  Randy Castrejon M.D on 10/14/2016 at 2:42 PM  Between 7am to 6pm -  Pager - [phone]  After 6pm go to [email or web address] - password [email or web address] [facility]  Office  [phone]  CC: Primary care physician; [name], MD

## 2016-10-14 NOTE — Care Management (Signed)
All Medications available for pick up at Medication management at discharge.  Prescriptions faxed. RNCM signing off

## 2016-10-14 NOTE — Progress Notes (Signed)
Hemodialysis: Interpretor bedside,patient stating that he was hungry,he haven't eating for 3 days and wanted some food.Explained to patient that he was nothing by mouth at this time for a procedure scheduled after dialysis.Patient requested pain medication at this time due to hungry pains and abdominal pains per patient.

## 2016-10-14 NOTE — Consult Note (Signed)
Mario Bellows MD  [address] [place], [place] [address] Phone: [phone] Fax : [phone]  Consultation  Referring Provider:    Dr Posey Pronto  Primary Care Physician:  [name], MD Primary Gastroenterologist:  None          Reason for Consultation:     Cirrhosis of the liver   Date of Admission:  10/13/2016 Date of Consultation:  10/14/2016         HPI:   Mario [name] is a 46 y.o. male admitted over night with abdominal pain. He has ESRD and is on dialysis . He has a history of pancreatitis.  RUQ USG 08/2016 -cirrhosis of the liver, no gall stones.  08/2016-paracentesis 2.9 L taken out CT abdomen 10/13/16 - Splenomegaly, large paraesophageal varices  Platelet count 121  Hb 11.4  BNP 784 Lipase normal AST/ALT/T bilirubin -Normal  Alk phos 155  He says he has had abdominal pain in the upper part for the past 10 days, non radiating, no clear aggravating or relieving factors. , no vomiting but some nausea. Denies any NSAID use,says he used to drink a lot of alcohol in the past but none presently.    Past Medical History:  Diagnosis Date  . Chronic combined systolic and diastolic CHF (congestive heart failure) ([place])   . Cirrhosis ([place])   . Dialysis patient ([address] [facility])   . Hypertension   . Renal disorder   . Renal insufficiency     Past Surgical History:  Procedure Laterality Date  . AV FISTULA PLACEMENT      Prior to Admission medications   Medication Sig Start Date End Date Taking? Authorizing Provider  amLODipine (NORVASC) 10 MG tablet Take 1 tablet (10 mg total) by mouth daily. Patient not taking: Reported on 10/13/2016 08/29/16   Hillary Bow, MD  irbesartan (AVAPRO) 150 MG tablet Take 1 tablet (150 mg total) by mouth daily. Patient not taking: Reported on 10/13/2016 08/30/16   Gladstone Lighter, MD  lanthanum (FOSRENOL) 500 MG chewable tablet Chew 500 mg by mouth daily.    Historical Provider, MD  metoprolol tartrate (LOPRESSOR) 25 MG tablet Take 1 tablet (25 mg  total) by mouth 2 (two) times daily. Patient not taking: Reported on 10/13/2016 04/16/16   Henreitta Leber, MD  traMADol (ULTRAM) 50 MG tablet Take 1 tablet (50 mg total) by mouth every 12 (twelve) hours as needed for moderate pain. Patient not taking: Reported on 10/13/2016 08/30/16   Gladstone Lighter, MD    Family History  Problem Relation Age of Onset  . Kidney failure Father   . Diabetes Mother      Social History  Substance Use Topics  . Smoking status: Current Every Day Smoker    Packs/day: 0.25    Types: Cigarettes  . Smokeless tobacco: Never Used  . Alcohol use Yes     Comment: 1/2 bottle tequilla per day    Allergies as of 10/13/2016  . (No Known Allergies)    Review of Systems:    All systems reviewed and negative except where noted in HPI.   Physical Exam:  Vital signs in last 24 hours: Temp:  [97.5 F (36.4 C)-97.8 F (36.6 C)] 97.5 F (36.4 C) (03/13 0532) Pulse Rate:  [58-88] 58 (03/13 0532) Resp:  [18-24] 20 (03/13 0532) BP: (121-187)/(69-100) 121/69 (03/13 0532) SpO2:  [98 %-100 %] 100 % (03/13 0532) Weight:  [132 lb 4.4 oz (60 kg)-173 lb (78.5 kg)] 173 lb (78.5 kg) (03/13 0418) Last BM Date: 10/13/16  General:   Pleasant, cooperative in NAD Head:  Normocephalic and atraumatic. Eyes:   No icterus.   Conjunctiva pink. PERRLA. Ears:  Normal auditory acuity. Neck:  Supple; no masses or thyroidomegaly Lungs: Respirations even and unlabored. Lungs clear to auscultation bilaterally.   No wheezes, crackles, or rhonchi.  Heart:  Regular rate and rhythm;  Without murmur, clicks, rubs or gallops Abdomen:  Soft,mildly , nontender. Normal bowel sounds. No appreciable masses or hepatomegaly.  No rebound or guarding.  Rectal:  Not performed. Extremities:  Without edema, cyanosis or clubbing. Neurologic:  Alert and oriented x3;  grossly normal neurologically. Skin:  Intact without significant lesions or rashes. Cervical Nodes:  No significant cervical  adenopathy. Psych:  Alert and cooperative. Normal affect.  LAB RESULTS:  Recent Labs  10/13/16 1307 10/14/16 0153  WBC 5.2 4.7  HGB 11.9* 11.4*  HCT 35.9* 34.3*  PLT 125* 121*   BMET  Recent Labs  10/13/16 1307 10/14/16 0153  NA 136 131*  K 5.3* 6.0*  CL 102 99*  CO2 19* 19*  GLUCOSE 99 91  BUN 54* 60*  CREATININE 11.61* 11.78*  CALCIUM 9.1 9.0   LFT  Recent Labs  10/13/16 1307  PROT 7.7  ALBUMIN 3.8  AST 19  ALT 14*  ALKPHOS 155*  BILITOT 1.0   PT/INR No results for input(s): LABPROT, INR in the last 72 hours.  STUDIES: Dg Chest 1 View  Result Date: 10/13/2016 CLINICAL DATA:  Patient with 3 days of abdominal pain. EXAM: CHEST 1 VIEW COMPARISON:  Chest radiograph 08/28/2016. FINDINGS: Stable cardiomegaly. Low lung volumes. No large area of pulmonary consolidation. No pleural effusion or pneumothorax. Surgical clips left axilla. IMPRESSION: Cardiomegaly.  No acute cardiopulmonary process.  Low lung volumes. Electronically Signed   By: Lovey Newcomer M.D.   On: 10/13/2016 15:44   Ct Abdomen Pelvis W Contrast  Result Date: 10/13/2016 CLINICAL DATA:  Epigastric abdominal pain.  History of pancreatitis. EXAM: CT ABDOMEN AND PELVIS WITH CONTRAST TECHNIQUE: Multidetector CT imaging of the abdomen and pelvis was performed using the standard protocol following bolus administration of intravenous contrast. CONTRAST:  153m ISOVUE-300 IOPAMIDOL (ISOVUE-300) INJECTION 61% COMPARISON:  Abdominal ultrasound 08/28/2016 FINDINGS: Lower chest: Bibasilar atelectasis. Coronary artery atherosclerotic calcification. Hepatobiliary: The liver is diffusely nodular and shrunken with relative hypertrophy of the caudate. No enhancing liver lesions are identified. Normal gallbladder. There is a moderate amount of perihepatic ascites. Pancreas: Normal pancreatic contours and enhancement. No peripancreatic fluid collection or pancreatic ductal dilatation. Spleen: The spleen is enlarged, measuring  16.6 cm in craniocaudal dimension. Adrenals/Urinary Tract: Normal adrenal glands. There is bilateral severe renal atrophy. Stomach/Bowel: No abnormal bowel dilatation. No bowel wall thickening or adjacent fat stranding to indicate acute inflammation. Moderate ascites throughout the abdomen. Normal appendix. Vascular/Lymphatic: There are large paraesophageal varices, a recannulized umbilical vein and small caliber. Splenic varices. The main portal vein, splenic vein and superior mesenteric vein are patent. The IVC is patent. The azygos vein is dilated and tortuous. There is calcific aortic atherosclerosis. No abdominal or pelvic lymphadenopathy. Reproductive: Normal prostate and seminal vesicles. The scrotum is edematous. Musculoskeletal: No lytic or blastic osseous lesion. Normal visualized extrathoracic and extraperitoneal soft tissues. Other: No contributory non-categorized findings. IMPRESSION: 1. Findings of hepatic cirrhosis and portal hypertension with splenomegaly, large caliber varices and moderate volume ascites. 2. Bilateral severe renal atrophy. 3. Calcific aortic and coronary artery atherosclerosis. Electronically Signed   By: KUlyses JarredM.D.   On: 10/13/2016 18:35  Impression / Plan:   Mario [name] is a 46 y.o. y/o male whom I have been consulted for cirrhosis of the liver. He also has ascites. He is presently admitted with acute onset of abdominal pain. CT scan shows ascites and features of portal hypertension.    Ascites likely combination from cirrhosis of liver and CKD. Abdominal pain may be from ascites vs gastritis     Plan  1. Diagnostic/therapeutic  paracentesis to r/o SBP 2. H pylori Stool antigen  3. PPI 4. If pain no better after paracentesis may need to consider EGD. He will need EGD to screen for varices as an outpatient of not done as an inpatient .  5. Low salt diet with salt <2 grams per day   LOS: 0 days   Mario Bellows, MD  10/14/2016, 8:38 AM

## 2016-10-14 NOTE — Care Management (Signed)
Patient chronic HD patient.  [facility] liaison notified of admission and planned discharge.  Previous admission notates that patient goes to .  Spoke with [facility] and patient is neither and active, or inactive patient with the clinic.  Patient was made a follow up patient at [facility].  Awaiting discharge prescriptions to see what assistance is available at Medication management

## 2016-10-14 NOTE — Progress Notes (Signed)
Hemodialysis started

## 2016-10-14 NOTE — Progress Notes (Signed)
Pre-hd tx 

## 2016-10-14 NOTE — Progress Notes (Signed)
Post hemodialysis: Tolerated 3.5hours of treatment with 2liters removed.Hemastasis achieved,sites dressed with gauze/taped.Patient going to Ultrasound prior to going back to his room

## 2016-10-14 NOTE — Progress Notes (Signed)
Interpreter was at bedside for discharge instructions. Patient discharge teaching given, including activity, diet, follow-up appoints, and medications that was written for the clinic. Patient verbalized understanding of all discharge instructions. IV access was d/c'd. Vitals are stable. Skin is intact except as charted in most recent assessments. Pt to be escorted out by NT, to be driven home by friend.   Nishi Neiswonger [employer]

## 2016-10-14 NOTE — Progress Notes (Signed)
[facility]  ROUNDING NOTE   Subjective:  Patient well-known to [place] from prior  Admissions. He presents now with   Midepigastric abdominal pain. CT scan of the abdomen and pelvis revealed moderate ascites, cirrhosis, and esophageal varices. Patient seen and evaluated during  Hemodialysis.    Objective:  Vital signs in last 24 hours:  Temp:  [97.5 F (36.4 C)-97.8 F (36.6 C)] 97.7 F (36.5 C) (03/13 0930) Pulse Rate:  [58-88] 65 (03/13 1200) Resp:  [13-24] 16 (03/13 1200) BP: (121-187)/(69-100) 139/84 (03/13 1200) SpO2:  [97 %-100 %] 100 % (03/13 1200) Weight:  [60 kg (132 lb 4.4 oz)-78.5 kg (173 lb)] 78.5 kg (173 lb) (03/13 0930)  Weight change:  Filed Weights   10/13/16 1302 10/14/16 0418 10/14/16 0930  Weight: 60 kg (132 lb 4.4 oz) 78.5 kg (173 lb) 78.5 kg (173 lb)    Intake/Output: I/O last 3 completed shifts: In: 255.8 [I.V.:255.8] Out: 0    Intake/Output this shift:  No intake/output data recorded.  Physical Exam: General: No acute distress  Head: Normocephalic, atraumatic. Moist oral mucosal membranes  Eyes: Anicteric  Neck: Supple, trachea midline  Lungs:  Clear to auscultation, normal effort  Heart: S1S2 no rubs  Abdomen:  Soft, midepigastric abdominal tenderness, mild distension.  Extremities: 1+ peripheral edema.  Neurologic: Nonfocal, moving all four extremities  Skin: No lesions  Access: LUE AVF    Basic Metabolic Panel:  Recent Labs Lab 10/13/16 1307 10/14/16 0153 10/14/16 0749  NA 136 131*  --   K 5.3* 6.0*  --   CL 102 99*  --   CO2 19* 19*  --   GLUCOSE 99 91  --   BUN 54* 60*  --   CREATININE 11.61* 11.78*  --   CALCIUM 9.1 9.0  --   PHOS  --   --  10.8*    Liver Function Tests:  Recent Labs Lab 10/13/16 1307  AST 19  ALT 14*  ALKPHOS 155*  BILITOT 1.0  PROT 7.7  ALBUMIN 3.8    Recent Labs Lab 10/13/16 1307  LIPASE 45   No results for input(s): AMMONIA in the last 168 hours.  CBC:  Recent  Labs Lab 10/13/16 1307 10/14/16 0153  WBC 5.2 4.7  HGB 11.9* 11.4*  HCT 35.9* 34.3*  MCV 99.0 97.3  PLT 125* 121*    Cardiac Enzymes:  Recent Labs Lab 10/13/16 1307 10/13/16 1919 10/14/16 0153 10/14/16 0748  TROPONINI 0.05* 0.03* 0.03* 0.03*    BNP: Invalid input(s): POCBNP  CBG: No results for input(s): GLUCAP in the last 168 hours.  Microbiology: Results for orders placed or performed during the hospital encounter of 10/13/16  MRSA PCR Screening     Status: None   Collection Time: 10/14/16  9:01 AM  Result Value Ref Range Status   MRSA by PCR NEGATIVE NEGATIVE Final    Comment:        The GeneXpert MRSA Assay (FDA approved for NASAL specimens only), is one component of a comprehensive MRSA colonization surveillance program. It is not intended to diagnose MRSA infection nor to guide or monitor treatment for MRSA infections.     Coagulation Studies: No results for input(s): LABPROT, INR in the last 72 hours.  Urinalysis: No results for input(s): COLORURINE, LABSPEC, PHURINE, GLUCOSEU, HGBUR, BILIRUBINUR, KETONESUR, PROTEINUR, UROBILINOGEN, NITRITE, LEUKOCYTESUR in the last 72 hours.  Invalid input(s): APPERANCEUR    Imaging: Dg Chest 1 View  Result Date: 10/13/2016 CLINICAL DATA:  Patient with  3 days of abdominal pain. EXAM: CHEST 1 VIEW COMPARISON:  Chest radiograph 08/28/2016. FINDINGS: Stable cardiomegaly. Low lung volumes. No large area of pulmonary consolidation. No pleural effusion or pneumothorax. Surgical clips left axilla. IMPRESSION: Cardiomegaly.  No acute cardiopulmonary process.  Low lung volumes. Electronically Signed   By: Lovey Newcomer M.D.   On: 10/13/2016 15:44   Ct Abdomen Pelvis W Contrast  Result Date: 10/13/2016 CLINICAL DATA:  Epigastric abdominal pain.  History of pancreatitis. EXAM: CT ABDOMEN AND PELVIS WITH CONTRAST TECHNIQUE: Multidetector CT imaging of the abdomen and pelvis was performed using the standard protocol following  bolus administration of intravenous contrast. CONTRAST:  141mL ISOVUE-300 IOPAMIDOL (ISOVUE-300) INJECTION 61% COMPARISON:  Abdominal ultrasound 08/28/2016 FINDINGS: Lower chest: Bibasilar atelectasis. Coronary artery atherosclerotic calcification. Hepatobiliary: The liver is diffusely nodular and shrunken with relative hypertrophy of the caudate. No enhancing liver lesions are identified. Normal gallbladder. There is a moderate amount of perihepatic ascites. Pancreas: Normal pancreatic contours and enhancement. No peripancreatic fluid collection or pancreatic ductal dilatation. Spleen: The spleen is enlarged, measuring 16.6 cm in craniocaudal dimension. Adrenals/Urinary Tract: Normal adrenal glands. There is bilateral severe renal atrophy. Stomach/Bowel: No abnormal bowel dilatation. No bowel wall thickening or adjacent fat stranding to indicate acute inflammation. Moderate ascites throughout the abdomen. Normal appendix. Vascular/Lymphatic: There are large paraesophageal varices, a recannulized umbilical vein and small caliber. Splenic varices. The main portal vein, splenic vein and superior mesenteric vein are patent. The IVC is patent. The azygos vein is dilated and tortuous. There is calcific aortic atherosclerosis. No abdominal or pelvic lymphadenopathy. Reproductive: Normal prostate and seminal vesicles. The scrotum is edematous. Musculoskeletal: No lytic or blastic osseous lesion. Normal visualized extrathoracic and extraperitoneal soft tissues. Other: No contributory non-categorized findings. IMPRESSION: 1. Findings of hepatic cirrhosis and portal hypertension with splenomegaly, large caliber varices and moderate volume ascites. 2. Bilateral severe renal atrophy. 3. Calcific aortic and coronary artery atherosclerosis. Electronically Signed   By: Ulyses Jarred M.D.   On: 10/13/2016 18:35     Medications:    . amLODipine  10 mg Oral Daily  . heparin  5,000 Units Subcutaneous Q8H  . irbesartan  150 mg  Oral Daily  . lanthanum  500 mg Oral Q breakfast  . metoprolol tartrate  25 mg Oral BID  . sodium chloride flush  3 mL Intravenous Q12H   sodium chloride, sodium chloride, acetaminophen **OR** acetaminophen, alteplase, heparin, HYDROmorphone (DILAUDID) injection, labetalol, lidocaine (PF), lidocaine-prilocaine, ondansetron **OR** ondansetron (ZOFRAN) IV, oxyCODONE, pentafluoroprop-tetrafluoroeth  Assessment/ Plan:  46 y.o. male with end-stage renal disease, hypertension, Alcohol induced cirrhosis of the liver, ascites  [facility], [place]., Tuesday, Thursday, Saturday 1. End-stage renal disease- Patient seen and evaluated during dialysis.  Tolerating well, next HD on Thursday if still here.   2. Anemia of chronic kidney disease Hgb 11.4, hold off on epogen.   3. Secondary hyperparathyroidism Phosphorous continues to be very high, suspect dietary indiscretion. Continue fosrenol, pt likely relying on samples as he doesn't have prescription coverage.  4. Ascites from cirrhosis of the liver.  Appreciate input from GI.   5.  Hyperkalemia: K 6.0, dialyze against 2K bath.     LOS: 0 Jatziry Wechter 3/13/201812:31 PM

## 2016-10-14 NOTE — Progress Notes (Signed)
Pre-hemodialysis 

## 2016-10-14 NOTE — Clinical Social Work Note (Signed)
CSW consulted to assist with discharge medications. RN CM is aware and will assess if they can assist with discharge medications or not. Shela Leff MSW,LCSW [phone]

## 2016-10-14 NOTE — Progress Notes (Signed)
Hemodialysis completed. 

## 2016-10-14 NOTE — Progress Notes (Signed)
Post hd tx 

## 2016-10-14 NOTE — Discharge Instructions (Signed)
Resume your HD as before °

## 2016-10-14 NOTE — Discharge Summary (Signed)
[facility] NAME: Mario [name]    MR#:  [id number]  DATE OF BIRTH:  1970/09/07  DATE OF ADMISSION:  10/13/2016 ADMITTING PHYSICIAN: Lance Coon, MD  DATE OF DISCHARGE: 10/14/16  PRIMARY CARE PHYSICIAN: [name], MD    ADMISSION DIAGNOSIS:  Intractable abdominal pain [R10.9] Nausea and vomiting, intractability of vomiting not specified, unspecified vomiting type [R11.2]  DISCHARGE DIAGNOSIS:  Acute on Chronic Epigastric pain due to acute gastritis Recurrent Ascites due to cirrhosis of liver s/p Paracentesis with 2.4 liter removed ESRD on HD HTN   SECONDARY DIAGNOSIS:   Past Medical History:  Diagnosis Date  . Chronic combined systolic and diastolic CHF (congestive heart failure) ([place])   . Cirrhosis ([place])   . Dialysis patient ([place])   . Hypertension   . Renal disorder   . Renal insufficiency     HOSPITAL COURSE:  Mario [name] a 46 y.o. malewith a known history of End stage renal disease due to hypertension on hemodialysis, history of chronic alcoholism with ascites, chronic, cytopenia comes to the emergency room with Mid epigastric pain and vomiting 1. Patient denies any hematemesis.  * Epigastric Abdominal pain on admission- secondary to gastritis and ascites -Patient has a very poor social situation. He is unable to access medication. He has not been taking his PPIs. -No hematemesis. His CT of the abdomen is negative. GI consult appreciated. -We'll place him back on PPI daily - also has h/o Chronic alcoholism with h/oAscites - worsening abdominal distention---s/p ultrasound-guided therapeutic paracentesis with 2.4 liters removed - No evidence of Hepatic Encephalopathy.  * Thrombocytopenia chronic - stable and will follow counts.  - due to chronic ETOH liver disease.   *ESRD on hemodialysis -Nephrology consultation placed  -On Tuesday Thursday Saturday hemodialysis.   *h/o  HTN -resumed home meds -Patient has not been a chronic taking his blood pressure meds and affect any of the meds due to his capability of taking medications and his social situation. CM has arranged for meds to be picked up from [facility]  Spoke with Care manager to see if there is any way we can get medications prescribed to patient. Patient has been 4 times admitted in the hospital the last 6 months for similar reasons  toelrated diet  D/c home  CONSULTS OBTAINED:  Treatment Team:  [name], MD Jonathon Bellows, MD  DRUG ALLERGIES:  No Known Allergies  DISCHARGE MEDICATIONS:   Current Discharge Medication List    START taking these medications   Details  calcium acetate (PHOSLO) 667 MG capsule Take 2 capsules (1,334 mg total) by mouth 3 (three) times daily with meals. Qty: 90 capsule, Refills: 0    losartan (COZAAR) 100 MG tablet Take 1 tablet (100 mg total) by mouth daily. Qty: 30 tablet, Refills: 1    pantoprazole (PROTONIX) 40 MG tablet Take 1 tablet (40 mg total) by mouth daily. Qty: 30 tablet, Refills: 0      CONTINUE these medications which have NOT CHANGED   Details  amLODipine (NORVASC) 10 MG tablet Take 1 tablet (10 mg total) by mouth daily. Qty: 30 tablet, Refills: 0    metoprolol tartrate (LOPRESSOR) 25 MG tablet Take 1 tablet (25 mg total) by mouth 2 (two) times daily. Qty: 60 tablet, Refills: 0    traMADol (ULTRAM) 50 MG tablet Take 1 tablet (50 mg total) by mouth every 12 (twelve) hours as needed for moderate pain. Qty: 20 tablet, Refills:  0      STOP taking these medications     irbesartan (AVAPRO) 150 MG tablet      lanthanum (FOSRENOL) 500 MG chewable tablet         If you experience worsening of your admission symptoms, develop shortness of breath, life threatening emergency, suicidal or homicidal thoughts you must seek medical attention immediately by calling 911 or calling your MD immediately  if symptoms less severe.  You  Must read complete instructions/literature along with all the possible adverse reactions/side effects for all the Medicines you take and that have been prescribed to you. Take any new Medicines after you have completely understood and accept all the possible adverse reactions/side effects.   Please note  You were cared for by a hospitalist during your hospital stay. If you have any questions about your discharge medications or the care you received while you were in the hospital after you are discharged, you can call the unit and asked to speak with the hospitalist on call if the hospitalist that took care of you is not available. Once you are discharged, your primary care physician will handle any further medical issues. Please note that NO REFILLS for any discharge medications will be authorized once you are discharged, as it is imperative that you return to your primary care physician (or establish a relationship with a primary care physician if you do not have one) for your aftercare needs so that they can reassess your need for medications and monitor your lab values.    DATA REVIEW:   CBC   Recent Labs Lab 10/14/16 0153  WBC 4.7  HGB 11.4*  HCT 34.3*  PLT 121*    Chemistries   Recent Labs Lab 10/13/16 1307 10/14/16 0153 10/14/16 1342 10/14/16 1501  NA 136 131*  --   --   K 5.3* 6.0* 3.2*  --   CL 102 99*  --   --   CO2 19* 19*  --   --   GLUCOSE 99 91  --   --   BUN 54* 60*  --   --   CREATININE 11.61* 11.78*  --   --   CALCIUM 9.1 9.0  --   --   AST 19  --   --   --   ALT 14*  --   --   --   ALKPHOS 155*  --   --   --   BILITOT 1.0  --   --  1.4*    Microbiology Results   Recent Results (from the past 240 hour(s))  MRSA PCR Screening     Status: None   Collection Time: 10/14/16  9:01 AM  Result Value Ref Range Status   MRSA by PCR NEGATIVE NEGATIVE Final    Comment:        The GeneXpert MRSA Assay (FDA approved for NASAL specimens only), is one component of  a comprehensive MRSA colonization surveillance program. It is not intended to diagnose MRSA infection nor to guide or monitor treatment for MRSA infections.     RADIOLOGY:  Dg Chest 1 View  Result Date: 10/13/2016 CLINICAL DATA:  Patient with 3 days of abdominal pain. EXAM: CHEST 1 VIEW COMPARISON:  Chest radiograph 08/28/2016. FINDINGS: Stable cardiomegaly. Low lung volumes. No large area of pulmonary consolidation. No pleural effusion or pneumothorax. Surgical clips left axilla. IMPRESSION: Cardiomegaly.  No acute cardiopulmonary process.  Low lung volumes. Electronically Signed   By: Polly Cobia.D.  On: 10/13/2016 15:44   Ct Abdomen Pelvis W Contrast  Result Date: 10/13/2016 CLINICAL DATA:  Epigastric abdominal pain.  History of pancreatitis. EXAM: CT ABDOMEN AND PELVIS WITH CONTRAST TECHNIQUE: Multidetector CT imaging of the abdomen and pelvis was performed using the standard protocol following bolus administration of intravenous contrast. CONTRAST:  148mL ISOVUE-300 IOPAMIDOL (ISOVUE-300) INJECTION 61% COMPARISON:  Abdominal ultrasound 08/28/2016 FINDINGS: Lower chest: Bibasilar atelectasis. Coronary artery atherosclerotic calcification. Hepatobiliary: The liver is diffusely nodular and shrunken with relative hypertrophy of the caudate. No enhancing liver lesions are identified. Normal gallbladder. There is a moderate amount of perihepatic ascites. Pancreas: Normal pancreatic contours and enhancement. No peripancreatic fluid collection or pancreatic ductal dilatation. Spleen: The spleen is enlarged, measuring 16.6 cm in craniocaudal dimension. Adrenals/Urinary Tract: Normal adrenal glands. There is bilateral severe renal atrophy. Stomach/Bowel: No abnormal bowel dilatation. No bowel wall thickening or adjacent fat stranding to indicate acute inflammation. Moderate ascites throughout the abdomen. Normal appendix. Vascular/Lymphatic: There are large paraesophageal varices, a recannulized  umbilical vein and small caliber. Splenic varices. The main portal vein, splenic vein and superior mesenteric vein are patent. The IVC is patent. The azygos vein is dilated and tortuous. There is calcific aortic atherosclerosis. No abdominal or pelvic lymphadenopathy. Reproductive: Normal prostate and seminal vesicles. The scrotum is edematous. Musculoskeletal: No lytic or blastic osseous lesion. Normal visualized extrathoracic and extraperitoneal soft tissues. Other: No contributory non-categorized findings. IMPRESSION: 1. Findings of hepatic cirrhosis and portal hypertension with splenomegaly, large caliber varices and moderate volume ascites. 2. Bilateral severe renal atrophy. 3. Calcific aortic and coronary artery atherosclerosis. Electronically Signed   By: Ulyses Jarred M.D.   On: 10/13/2016 18:35   US Paracentesis  Result Date: 10/14/2016 INDICATION: Cirrhosis, abdominal distention, discomfort EXAM: ULTRASOUND GUIDED PARACENTESIS MEDICATIONS: 1% lidocaine locally COMPLICATIONS: None immediate. PROCEDURE: An ultrasound guided paracentesis was thoroughly discussed with the patient and questions answered. The benefits, risks, alternatives and complications were also discussed. The patient understands and wishes to proceed with the procedure. Written consent was obtained. Ultrasound was performed to localize and mark an adequate pocket of fluid in the right lower quadrant of the abdomen. The area was then prepped and draped in the normal sterile fashion. 1% Lidocaine was used for local anesthesia. Under ultrasound guidance a safety centesis needle catheter was introduced. Paracentesis was performed. The catheter was removed and a dressing applied. FINDINGS: A total of approximately 2.45 L of amber colored peritoneal fluid was removed. A fluid sample was sent for laboratory analysis. IMPRESSION: Successful ultrasound guided paracentesis yielding 2.45 L of ascites. Electronically Signed   By: Jerilynn Mages.  Shick M.D.    On: 10/14/2016 15:00     Management plans discussed with the patient, family and they are in agreement.  CODE STATUS:     Code Status Orders        Start     Ordered   10/13/16 2137  Full code  Continuous     10/13/16 2136    Code Status History    Date Active Date Inactive Code Status Order ID Comments User Context   08/28/2016  4:58 PM 08/30/2016  5:19 PM Full Code [id number]  Fritzi Mandes, MD ED   07/03/2016  8:06 PM 07/06/2016  4:22 PM Full Code [id number]  Fritzi Mandes, MD ED   04/11/2016  6:57 PM 04/12/2016  7:16 AM Full Code [id number]  Vaughan Basta, MD Inpatient   01/25/2015 12:29 PM 01/28/2015  4:26 PM Full Code [id number]  Dustin Flock, MD Inpatient  TOTAL TIME TAKING CARE OF THIS PATIENT: 40 minutes.    Amarie Tarte M.D on 10/14/2016 at 4:47 PM  Between 7am to 6pm - Pager - [phone] After 6pm go to [email or web address] - password [email or web address] [facility]  Office  [phone]  CC: Primary care physician; [name], MD

## 2016-10-15 LAB — TRIGLYCERIDES, BODY FLUIDS: Triglycerides, Fluid: 44 mg/dL

## 2016-10-15 LAB — PARATHYROID HORMONE, INTACT (NO CA): PTH: 1004 pg/mL — AB (ref 15–65)

## 2016-10-15 LAB — HEPATITIS B SURFACE ANTIGEN: Hepatitis B Surface Ag: NEGATIVE

## 2016-10-16 LAB — CYTOLOGY - NON PAP

## 2016-10-17 LAB — BODY FLUID CULTURE: Culture: NO GROWTH

## 2016-10-28 ENCOUNTER — Encounter: Payer: Self-pay | Admitting: Emergency Medicine

## 2016-10-28 ENCOUNTER — Inpatient Hospital Stay
Admission: EM | Admit: 2016-10-28 | Discharge: 2016-10-31 | DRG: 640 | Disposition: A | Discharge status: Home / Self Care | Payer: Medicaid Other | Attending: Internal Medicine | Admitting: Internal Medicine

## 2016-10-28 ENCOUNTER — Emergency Department: Payer: Medicaid Other

## 2016-10-28 DIAGNOSIS — Z833 Family history of diabetes mellitus: Secondary | ICD-10-CM | POA: Diagnosis not present

## 2016-10-28 DIAGNOSIS — N2581 Secondary hyperparathyroidism of renal origin: Secondary | ICD-10-CM | POA: Diagnosis present

## 2016-10-28 DIAGNOSIS — E875 Hyperkalemia: Principal | ICD-10-CM

## 2016-10-28 DIAGNOSIS — Z9119 Patient's noncompliance with other medical treatment and regimen: Secondary | ICD-10-CM

## 2016-10-28 DIAGNOSIS — K2970 Gastritis, unspecified, without bleeding: Secondary | ICD-10-CM | POA: Diagnosis present

## 2016-10-28 DIAGNOSIS — F1020 Alcohol dependence, uncomplicated: Secondary | ICD-10-CM | POA: Diagnosis present

## 2016-10-28 DIAGNOSIS — K219 Gastro-esophageal reflux disease without esophagitis: Secondary | ICD-10-CM | POA: Diagnosis present

## 2016-10-28 DIAGNOSIS — R1084 Generalized abdominal pain: Secondary | ICD-10-CM

## 2016-10-28 DIAGNOSIS — F17210 Nicotine dependence, cigarettes, uncomplicated: Secondary | ICD-10-CM | POA: Diagnosis present

## 2016-10-28 DIAGNOSIS — I132 Hypertensive heart and chronic kidney disease with heart failure and with stage 5 chronic kidney disease, or end stage renal disease: Secondary | ICD-10-CM | POA: Diagnosis present

## 2016-10-28 DIAGNOSIS — Z992 Dependence on renal dialysis: Secondary | ICD-10-CM

## 2016-10-28 DIAGNOSIS — N186 End stage renal disease: Secondary | ICD-10-CM | POA: Diagnosis present

## 2016-10-28 DIAGNOSIS — Z841 Family history of disorders of kidney and ureter: Secondary | ICD-10-CM

## 2016-10-28 DIAGNOSIS — D631 Anemia in chronic kidney disease: Secondary | ICD-10-CM | POA: Diagnosis present

## 2016-10-28 DIAGNOSIS — I5042 Chronic combined systolic (congestive) and diastolic (congestive) heart failure: Secondary | ICD-10-CM | POA: Diagnosis present

## 2016-10-28 DIAGNOSIS — K7031 Alcoholic cirrhosis of liver with ascites: Secondary | ICD-10-CM | POA: Diagnosis present

## 2016-10-28 DIAGNOSIS — R1010 Upper abdominal pain, unspecified: Secondary | ICD-10-CM

## 2016-10-28 DIAGNOSIS — R188 Other ascites: Secondary | ICD-10-CM

## 2016-10-28 HISTORY — DX: Hyperkalemia: E87.5

## 2016-10-28 LAB — CBC
HCT: 39.7 % — ABNORMAL LOW (ref 40.0–52.0)
HEMOGLOBIN: 13.1 g/dL (ref 13.0–18.0)
MCH: 32.9 pg (ref 26.0–34.0)
MCHC: 33 g/dL (ref 32.0–36.0)
MCV: 99.8 fL (ref 80.0–100.0)
PLATELETS: 149 10*3/uL — AB (ref 150–440)
RBC: 3.98 MIL/uL — AB (ref 4.40–5.90)
RDW: 16.1 % — ABNORMAL HIGH (ref 11.5–14.5)
WBC: 5.2 10*3/uL (ref 3.8–10.6)

## 2016-10-28 LAB — POTASSIUM
POTASSIUM: 6.2 mmol/L — AB (ref 3.5–5.1)
Potassium: 4.1 mmol/L (ref 3.5–5.1)

## 2016-10-28 LAB — COMPREHENSIVE METABOLIC PANEL
ALK PHOS: 185 U/L — AB (ref 38–126)
ALT: 17 U/L (ref 17–63)
ANION GAP: 16 — AB (ref 5–15)
AST: 26 U/L (ref 15–41)
Albumin: 4.1 g/dL (ref 3.5–5.0)
BUN: 66 mg/dL — ABNORMAL HIGH (ref 6–20)
CALCIUM: 9.3 mg/dL (ref 8.9–10.3)
CO2: 22 mmol/L (ref 22–32)
Chloride: 100 mmol/L — ABNORMAL LOW (ref 101–111)
Creatinine, Ser: 14.26 mg/dL — ABNORMAL HIGH (ref 0.61–1.24)
GFR calc non Af Amer: 4 mL/min — ABNORMAL LOW (ref >60)
GFR, EST AFRICAN AMERICAN: 4 mL/min — AB (ref >60)
Glucose, Bld: 106 mg/dL — ABNORMAL HIGH (ref 65–99)
Potassium: 7.3 mmol/L (ref 3.5–5.1)
SODIUM: 138 mmol/L (ref 135–145)
Total Bilirubin: 1 mg/dL (ref 0.3–1.2)
Total Protein: 8.3 g/dL — ABNORMAL HIGH (ref 6.5–8.1)

## 2016-10-28 LAB — LIPASE, BLOOD: LIPASE: 38 U/L (ref 11–51)

## 2016-10-28 LAB — MRSA PCR SCREENING: MRSA by PCR: NEGATIVE

## 2016-10-28 LAB — GLUCOSE, CAPILLARY: GLUCOSE-CAPILLARY: 83 mg/dL (ref 65–99)

## 2016-10-28 MED ORDER — INSULIN ASPART 100 UNIT/ML ~~LOC~~ SOLN
SUBCUTANEOUS | Status: AC
  Administered 2016-10-28: 10 [IU] via INTRAVENOUS
  Filled 2016-10-28: qty 10

## 2016-10-28 MED ORDER — ACETAMINOPHEN 650 MG RE SUPP: 650.0000 mg | Freq: Four times a day (QID) | RECTAL | Status: DC | PRN

## 2016-10-28 MED ORDER — DEXTROSE 50 % IV SOLN
2.0000 | Freq: Once | INTRAVENOUS | Status: AC
  Administered 2016-10-28: 100 mL via INTRAVENOUS
  Filled 2016-10-28: qty 100

## 2016-10-28 MED ORDER — DEXTROSE 5 % IV SOLN
2.0000 g | Freq: Once | INTRAVENOUS | Status: AC
  Administered 2016-10-28: 2 g via INTRAVENOUS
  Filled 2016-10-28 (×2): qty 2

## 2016-10-28 MED ORDER — ALBUTEROL SULFATE (2.5 MG/3ML) 0.083% IN NEBU
10.0000 mg | INHALATION_SOLUTION | Freq: Once | RESPIRATORY_TRACT | Status: AC
  Administered 2016-10-28: 10 mg via RESPIRATORY_TRACT
  Filled 2016-10-28: qty 12

## 2016-10-28 MED ORDER — AMLODIPINE BESYLATE 10 MG PO TABS
10.0000 mg | ORAL_TABLET | Freq: Every day | ORAL | Status: DC
  Administered 2016-10-28 – 2016-10-31 (×4): 10 mg via ORAL
  Filled 2016-10-28 (×4): qty 1

## 2016-10-28 MED ORDER — HYDROMORPHONE HCL 1 MG/ML IJ SOLN
1.0000 mg | INTRAMUSCULAR | Status: AC
  Administered 2016-10-28: 1 mg via INTRAVENOUS
  Filled 2016-10-28: qty 1

## 2016-10-28 MED ORDER — ACETAMINOPHEN 325 MG PO TABS: 650.0000 mg | ORAL_TABLET | Freq: Four times a day (QID) | ORAL | Status: DC | PRN

## 2016-10-28 MED ORDER — INSULIN ASPART 100 UNIT/ML IV SOLN
10.0000 [IU] | Freq: Once | INTRAVENOUS | Status: AC
  Administered 2016-10-28: 10 [IU] via INTRAVENOUS
  Filled 2016-10-28: qty 0.1

## 2016-10-28 MED ORDER — CALCIUM GLUCONATE 10 % IV SOLN
1.0000 g | Freq: Once | INTRAVENOUS | Status: AC
  Administered 2016-10-28: 1 g via INTRAVENOUS
  Filled 2016-10-28: qty 10

## 2016-10-28 MED ORDER — LOSARTAN POTASSIUM 50 MG PO TABS: 100.0000 mg | ORAL_TABLET | Freq: Every day | ORAL | Status: DC

## 2016-10-28 MED ORDER — METOPROLOL TARTRATE 25 MG PO TABS
25.0000 mg | ORAL_TABLET | Freq: Two times a day (BID) | ORAL | Status: DC
  Administered 2016-10-28 – 2016-10-30 (×4): 25 mg via ORAL
  Filled 2016-10-28 (×4): qty 1

## 2016-10-28 MED ORDER — PANTOPRAZOLE SODIUM 40 MG PO TBEC
40.0000 mg | DELAYED_RELEASE_TABLET | Freq: Every day | ORAL | Status: DC
  Administered 2016-10-28 – 2016-10-31 (×4): 40 mg via ORAL
  Filled 2016-10-28 (×4): qty 1

## 2016-10-28 MED ORDER — ONDANSETRON HCL 4 MG PO TABS: 4.0000 mg | ORAL_TABLET | Freq: Four times a day (QID) | ORAL | Status: DC | PRN

## 2016-10-28 MED ORDER — ONDANSETRON HCL 4 MG/2ML IJ SOLN
4.0000 mg | Freq: Four times a day (QID) | INTRAMUSCULAR | Status: DC | PRN
  Filled 2016-10-28: qty 2

## 2016-10-28 MED ORDER — HYDROMORPHONE HCL 1 MG/ML IJ SOLN
1.0000 mg | Freq: Once | INTRAMUSCULAR | Status: AC
  Administered 2016-10-28: 1 mg via INTRAVENOUS
  Filled 2016-10-28: qty 1

## 2016-10-28 MED ORDER — FENTANYL CITRATE (PF) 100 MCG/2ML IJ SOLN
25.0000 ug | INTRAMUSCULAR | Status: DC | PRN
  Administered 2016-10-28 (×2): 25 ug via INTRAVENOUS
  Administered 2016-10-28 – 2016-10-29 (×2): 50 ug via INTRAVENOUS
  Filled 2016-10-28 (×4): qty 2

## 2016-10-28 MED ORDER — HYDROCODONE-ACETAMINOPHEN 5-325 MG PO TABS
1.0000 | ORAL_TABLET | ORAL | Status: DC | PRN
  Administered 2016-10-28 (×2): 2 via ORAL
  Administered 2016-10-29 (×2): 1 via ORAL
  Administered 2016-10-29 – 2016-10-31 (×7): 2 via ORAL
  Administered 2016-10-31: 1 via ORAL
  Filled 2016-10-28 (×7): qty 2
  Filled 2016-10-28 (×2): qty 1
  Filled 2016-10-28 (×3): qty 2

## 2016-10-28 MED ORDER — SODIUM POLYSTYRENE SULFONATE 15 GM/60ML PO SUSP
30.0000 g | Freq: Once | ORAL | Status: AC
  Administered 2016-10-28: 30 g via ORAL
  Filled 2016-10-28: qty 120

## 2016-10-28 MED ORDER — HEPARIN SODIUM (PORCINE) 5000 UNIT/ML IJ SOLN
5000.0000 [IU] | Freq: Three times a day (TID) | INTRAMUSCULAR | Status: DC
  Administered 2016-10-28 – 2016-10-31 (×7): 5000 [IU] via SUBCUTANEOUS
  Filled 2016-10-28 (×7): qty 1

## 2016-10-28 MED ORDER — DOCUSATE SODIUM 100 MG PO CAPS
100.0000 mg | ORAL_CAPSULE | Freq: Two times a day (BID) | ORAL | Status: DC
  Administered 2016-10-28 – 2016-10-30 (×5): 100 mg via ORAL
  Filled 2016-10-28 (×5): qty 1

## 2016-10-28 MED ORDER — SODIUM BICARBONATE 8.4 % IV SOLN
50.0000 meq | Freq: Once | INTRAVENOUS | Status: AC
  Administered 2016-10-28: 50 meq via INTRAVENOUS
  Filled 2016-10-28: qty 50

## 2016-10-28 MED ORDER — CALCIUM ACETATE (PHOS BINDER) 667 MG PO CAPS
1334.0000 mg | ORAL_CAPSULE | Freq: Three times a day (TID) | ORAL | Status: DC
  Administered 2016-10-29 – 2016-10-31 (×5): 1334 mg via ORAL
  Filled 2016-10-28 (×5): qty 2

## 2016-10-28 NOTE — Progress Notes (Signed)
Start of hd 

## 2016-10-28 NOTE — ED Triage Notes (Signed)
Pt to ED via EMS from dialysis, unable to complete dialysis due to pain.  Per EMS patient c/o abd pain and headache x2-3 weeks.  EMS vitals 170/80 BP, 115 CBG, 97% RA, 70 HR.  Spanish interpreter paged.

## 2016-10-28 NOTE — Progress Notes (Signed)
[name] Progress Note Patient Name: Mario [name] DOB: [birth year] MRN: [id number]   Date of Service  10/28/2016  HPI/Events of Note  46 yo with multiple medical issues K=7.3 Not on vent not on vasorpessors  eICU Interventions  Hospitalist to admit, PCCM to consult Case discussed with dr Nelda Marseille 10/28/2016, 3:28 PM

## 2016-10-28 NOTE — ED Notes (Signed)
Pain 10/10 at this time, see MAR.

## 2016-10-28 NOTE — Consult Note (Signed)
[facility] Consultation    SYNOPSIS   46 yo [place] speaking male with ESRD. Was sent over from HD center due to severe abdominal pain. History of S-CHF, recent admits due to abd pain.   ASSESSMENT/PLAN    A: Acute abdominal pain, s/p paracentesis. Possible pancreatitis/gastritis.  Hyperkalemia, due to missing HD today due to pain.  Cirrhosis of liver with continued alcohol abuse.   P: --IVF.  --Check amylase, lipase, NPO.  --HD for elevated K.  --PPI.  --Empiric abx for possible SBP.  --If not improving in next 12 hours may need CT abd and surg consultion.    Best Practices  DVT Prophylaxis: SQ heparin.  GI Prophylaxis: PPI.   ---------------------------------------  ---------------------------------------   Name: Mario [name] MRN: [id number] DOB: 1971/06/01    ADMISSION DATE:  10/28/2016 CONSULTATION DATE:  10/28/16  REFERRING MD :  Dr. Ether Griffins  CHIEF COMPLAINT:  Abdominal pain   HISTORY OF PRESENT ILLNESS:   The patient is a spanish speaking but speaks a few words of English. Currently he is complaining of severe abdominal pain. He has a history of End stage renal disease  on hemodialysis, chronic alcoholism with ascites, systolic CHF with WU=98%.. He had similar presentations of abd pain in Jan and earlier this month. He underwent paracentesis on earlier this month with removal of 2.4L.   Review of abd xray images show increased ns bowel gas pattern.  Echo results 04/12/16; EF=40%; PASP=42  PAST MEDICAL HISTORY :  Past Medical History:  Diagnosis Date  . Chronic combined systolic and diastolic CHF (congestive heart failure) ([place])   . Cirrhosis ([place])   . Dialysis patient ([place])   . Hypertension   . Renal disorder   . Renal insufficiency    Past Surgical History:  Procedure Laterality Date  . AV FISTULA PLACEMENT     Prior to Admission medications   Medication Sig Start Date End Date Taking? Authorizing Provider    amLODipine (NORVASC) 10 MG tablet Take 1 tablet (10 mg total) by mouth daily. Patient not taking: Reported on 10/13/2016 08/29/16   Hillary Bow, MD  calcium acetate (PHOSLO) 667 MG capsule Take 2 capsules (1,334 mg total) by mouth 3 (three) times daily with meals. Patient not taking: Reported on 10/28/2016 10/14/16   Fritzi Mandes, MD  losartan (COZAAR) 100 MG tablet Take 1 tablet (100 mg total) by mouth daily. Patient not taking: Reported on 10/28/2016 10/14/16   Fritzi Mandes, MD  metoprolol tartrate (LOPRESSOR) 25 MG tablet Take 1 tablet (25 mg total) by mouth 2 (two) times daily. Patient not taking: Reported on 10/13/2016 04/16/16   Henreitta Leber, MD  pantoprazole (PROTONIX) 40 MG tablet Take 1 tablet (40 mg total) by mouth daily. Patient not taking: Reported on 10/28/2016 10/14/16   Fritzi Mandes, MD  traMADol (ULTRAM) 50 MG tablet Take 1 tablet (50 mg total) by mouth every 12 (twelve) hours as needed for moderate pain. Patient not taking: Reported on 10/13/2016 08/30/16   Gladstone Lighter, MD   No Known Allergies  FAMILY HISTORY:  Family History  Problem Relation Age of Onset  . Kidney failure Father   . Diabetes Mother    SOCIAL HISTORY:  reports that he has been smoking Cigarettes.  He has been smoking about 0.25 packs per day. He has never used smokeless tobacco. He reports that he does not drink alcohol or use drugs.  REVIEW OF SYSTEMS:   Could not obtain due to severe abd pain  and distess.    VITAL SIGNS: Temp:  [97.5 F (36.4 C)] 97.5 F (36.4 C) (03/27 1215) Pulse Rate:  [66-90] 78 (03/27 1500) Resp:  [15-20] 20 (03/27 1500) BP: (138-184)/(64-87) 138/64 (03/27 1500) SpO2:  [93 %-100 %] 93 % (03/27 1500) Weight:  [173 lb (78.5 kg)] 173 lb (78.5 kg) (03/27 1216) HEMODYNAMICS:   VENTILATOR SETTINGS:   INTAKE / OUTPUT: No intake or output data in the 24 hours ending 10/28/16 1557  Physical Examination:   VS: BP 138/64   Pulse 78   Temp 97.5 F (36.4 C) (Oral)   Resp 20    Ht 5\' 2"  (1.575 m)   Wt 173 lb (78.5 kg)   SpO2 93%   BMI 31.64 kg/m   General Appearance: No distress  Neuro:without focal findings, mental status reduced.  HEENT: PERRLA, EOM intact, no ptosis, no other lesions noticed;  Pulmonary: normal breath sounds., decreased air entry bilaterally.  CardiovascularNormal S1,S2.  No m/r/g.    Abdomen: mild firmness, diffuse tenderness, increased BS.  Renal:  No costovertebral tenderness  GU:  Not performed at this time. Endoc: No evident thyromegaly, no signs of acromegaly. Skin:   warm, no rashes, no ecchymosis  Extremities: normal, no cyanosis, clubbing, no edema, warm with normal capillary refill.    LABS: Reviewed   LABORATORY PANEL:   CBC  Recent Labs Lab 10/28/16 1211  WBC 5.2  HGB 13.1  HCT 39.7*  PLT 149*    Chemistries   Recent Labs Lab 10/28/16 1211  NA 138  K 7.3*  CL 100*  CO2 22  GLUCOSE 106*  BUN 66*  CREATININE 14.26*  CALCIUM 9.3  AST 26  ALT 17  ALKPHOS 185*  BILITOT 1.0    No results for input(s): GLUCAP in the last 168 hours. No results for input(s): PHART, PCO2ART, PO2ART in the last 168 hours.  Recent Labs Lab 10/28/16 1211  AST 26  ALT 17  ALKPHOS 185*  BILITOT 1.0  ALBUMIN 4.1    Cardiac Enzymes No results for input(s): TROPONINI in the last 168 hours.  RADIOLOGY:  Dg Abdomen Acute W/chest  Result Date: 10/28/2016 CLINICAL DATA:  Unable to fully dialysis due to abdominal pain. EXAM: DG ABDOMEN ACUTE W/ 1V CHEST COMPARISON:  CT scan 10/13/2016 FINDINGS: The upright chest x-ray demonstrates mild cardiac enlargement. Right paratracheal density appears stable when compared to prior chest x-rays and is likely in part due to a large azygos vein as demonstrated on a prior chest CT. Low lung volumes with vascular crowding and areas of atelectasis. No definite pleural effusions. Abdominal films demonstrate an unremarkable bowel gas pattern. No findings for obstruction or perforation.  Increased density and poor definition of the soft tissue shadows suggesting ascites from patient's known cirrhosis. The bony structures are intact. IMPRESSION: No acute cardiopulmonary findings.  Stable cardiac enlargement. No findings for obstruction or perforation. Suspect abdominal ascites. Electronically Signed   By: Marijo Sanes M.D.   On: 10/28/2016 13:27       --Marda Stalker, MD.  Board Certified in Internal Medicine, Pulmonary Medicine, [place], and Sleep Medicine.  ICU Pager [phone] [facility] Office Number: [phone]  Patricia Pesa, M.D.  Merton Border, M.D   10/28/2016, 3:57 PM

## 2016-10-28 NOTE — ED Notes (Signed)
When pt is awake he states pain is currently 8/10, however pt falling asleep in room during assessment and appears very drowsy at this time.

## 2016-10-28 NOTE — ED Provider Notes (Signed)
[facility] Emergency Department Provider Note  ____________________________________________  Time seen: Approximately 1:41 PM  I have reviewed the triage vital signs and the nursing notes.   HISTORY  Chief Complaint Abdominal Pain and Headache    HPI Kelen Laura is a 46 y.o. male who complains of generalized abdominal pain as well as a bilateral frontal headache for the past 2-3 days. No aggravating or alleviating factors. Abdominal pain is sharp and severe. Nonradiating. Reports she's also been having some vomiting. No diarrhea. Was over at dialysis today but because of the abdominal pain he was sent to the ED without treatment.   Patient denies palpitations chest pain shortness of breath dizziness or syncope.     Past Medical History:  Diagnosis Date  . Chronic combined systolic and diastolic CHF (congestive heart failure) ([place])   . Cirrhosis ([place])   . Dialysis patient ([place])   . Hypertension   . Renal disorder   . Renal insufficiency      Patient Active Problem List   Diagnosis Date Noted  . Chronic combined systolic and diastolic CHF (congestive heart failure) ([place]) 10/13/2016  . Cirrhosis ([place]) 10/13/2016  . HTN (hypertension) 10/13/2016  . Abdominal pain 08/28/2016  . Pulmonary edema 07/03/2016  . Fluid overload 04/12/2016  . Chest pain 04/11/2016  . Scrotal infection 01/25/2015  . ESRD (end stage renal disease) on dialysis ([place]) 01/25/2015     Past Surgical History:  Procedure Laterality Date  . AV FISTULA PLACEMENT       Prior to Admission medications   Medication Sig Start Date End Date Taking? Authorizing Provider  amLODipine (NORVASC) 10 MG tablet Take 1 tablet (10 mg total) by mouth daily. Patient not taking: Reported on 10/13/2016 08/29/16   Hillary Bow, MD  calcium acetate (PHOSLO) 667 MG capsule Take 2 capsules (1,334 mg total) by mouth 3 (three) times daily with meals. 10/14/16   Fritzi Mandes, MD  losartan (COZAAR)  100 MG tablet Take 1 tablet (100 mg total) by mouth daily. 10/14/16   Fritzi Mandes, MD  metoprolol tartrate (LOPRESSOR) 25 MG tablet Take 1 tablet (25 mg total) by mouth 2 (two) times daily. Patient not taking: Reported on 10/13/2016 04/16/16   Henreitta Leber, MD  pantoprazole (PROTONIX) 40 MG tablet Take 1 tablet (40 mg total) by mouth daily. 10/14/16   Fritzi Mandes, MD  traMADol (ULTRAM) 50 MG tablet Take 1 tablet (50 mg total) by mouth every 12 (twelve) hours as needed for moderate pain. Patient not taking: Reported on 10/13/2016 08/30/16   Gladstone Lighter, MD     Allergies Patient has no known allergies.   Family History  Problem Relation Age of Onset  . Kidney failure Father   . Diabetes Mother     Social History Social History  Substance Use Topics  . Smoking status: Current Every Day Smoker    Packs/day: 0.25    Types: Cigarettes  . Smokeless tobacco: Never Used  . Alcohol use No     Comment: 1/2 bottle tequilla per day    Review of Systems  Constitutional:   No fever or chills.  ENT:   No sore throat. No rhinorrhea. Cardiovascular:   No chest pain. Respiratory:   No dyspnea or cough. Gastrointestinal:   Positive generalized abdominal pain with occasional vomiting..  Genitourinary:   Negative for dysuria or difficulty urinating. Musculoskeletal:   Negative for focal pain or swelling Neurological:   Positive as above for headaches 10-point ROS otherwise negative.  ____________________________________________   PHYSICAL EXAM:  VITAL SIGNS: ED Triage Vitals  Enc Vitals Group     BP 10/28/16 1215 (!) 167/84     Pulse Rate 10/28/16 1215 68     Resp 10/28/16 1215 18     Temp 10/28/16 1215 97.5 F (36.4 C)     Temp Source 10/28/16 1215 Oral     SpO2 10/28/16 1215 99 %     Weight 10/28/16 1216 173 lb (78.5 kg)     Height 10/28/16 1216 5\' 2"  (1.575 m)     Head Circumference --      Peak Flow --      Pain Score 10/28/16 1215 10     Pain Loc --      Pain Edu? --       Excl. in [place]? --     Vital signs reviewed, nursing assessments reviewed.   Constitutional:   Alert and oriented. Uncomfortable, not in distress Eyes:   No scleral icterus. No conjunctival pallor. PERRL. EOMI.  No nystagmus. ENT   Head:   Normocephalic and atraumatic.   Nose:   No congestion/rhinnorhea. No septal hematoma   Mouth/Throat:   MMM, no pharyngeal erythema. No peritonsillar mass.    Neck:   No stridor. No SubQ emphysema. No meningismus. Hematological/Lymphatic/Immunilogical:   No cervical lymphadenopathy. Cardiovascular:   RRR. Symmetric bilateral radial and DP pulses.  No murmurs. Left upper extremity AV fistula with good thrill Respiratory:   Normal respiratory effort without tachypnea nor retractions. Breath sounds are clear and equal bilaterally. No wheezes/rales/rhonchi. Gastrointestinal:   Soft with generalized tenderness. Moderately distended but not tense. Positive tympany. There is no CVA tenderness.  No rebound, rigidity, or guarding. Genitourinary:   deferred Musculoskeletal:   Normal range of motion in all extremities. No joint effusions.  No lower extremity tenderness.  No edema. Neurologic:   Normal speech and language.  CN 2-10 normal. Motor grossly intact. No gross focal neurologic deficits are appreciated.  Skin:    Skin is warm, dry and intact. No rash noted.  No petechiae, purpura, or bullae.  ____________________________________________    LABS (pertinent positives/negatives) (all labs ordered are listed, but only abnormal results are displayed) Labs Reviewed  COMPREHENSIVE METABOLIC PANEL - Abnormal; Notable for the following:       Result Value   Potassium 7.3 (*)    Chloride 100 (*)    Glucose, Bld 106 (*)    BUN 66 (*)    Creatinine, Ser 14.26 (*)    Total Protein 8.3 (*)    Alkaline Phosphatase 185 (*)    GFR calc non Af Amer 4 (*)    GFR calc Af Amer 4 (*)    Anion gap 16 (*)    All other components within normal limits   CBC - Abnormal; Notable for the following:    RBC 3.98 (*)    HCT 39.7 (*)    RDW 16.1 (*)    Platelets 149 (*)    All other components within normal limits  LIPASE, BLOOD  URINALYSIS, COMPLETE (UACMP) WITH MICROSCOPIC   ____________________________________________   EKG  Interpreted by me Sinus rhythm rate of 68, left axis, normal intervals. Normal QRS and ST segments. Peaked T waves in the anterior leads. No acute ischemic changes.  ____________________________________________    [id number]  Dg Abdomen Acute W/chest  Result Date: 10/28/2016 CLINICAL DATA:  Unable to fully dialysis due to abdominal pain. EXAM: DG ABDOMEN ACUTE W/ 1V CHEST COMPARISON:  CT  scan 10/13/2016 FINDINGS: The upright chest x-ray demonstrates mild cardiac enlargement. Right paratracheal density appears stable when compared to prior chest x-rays and is likely in part due to a large azygos vein as demonstrated on a prior chest CT. Low lung volumes with vascular crowding and areas of atelectasis. No definite pleural effusions. Abdominal films demonstrate an unremarkable bowel gas pattern. No findings for obstruction or perforation. Increased density and poor definition of the soft tissue shadows suggesting ascites from patient's known cirrhosis. The bony structures are intact. IMPRESSION: No acute cardiopulmonary findings.  Stable cardiac enlargement. No findings for obstruction or perforation. Suspect abdominal ascites. Electronically Signed   By: Marijo Sanes M.D.   On: 10/28/2016 13:27    ____________________________________________   PROCEDURES Procedures CRITICAL CARE Performed by: Joni Fears, Norma Montemurro   Total critical care time: 35 minutes  Critical care time was exclusive of separately billable procedures and treating other patients.  Critical care was necessary to treat or prevent imminent or life-threatening deterioration.  Critical care was time spent personally by me on the following  activities: development of treatment plan with patient and/or surrogate as well as nursing, discussions with consultants, evaluation of patient's response to treatment, examination of patient, obtaining history from patient or surrogate, ordering and performing treatments and interventions, ordering and review of laboratory studies, ordering and review of radiographic studies, pulse oximetry and re-evaluation of patient's condition.  ____________________________________________   INITIAL IMPRESSION / ASSESSMENT AND PLAN / ED COURSE  Pertinent labs & imaging results that were available during my care of the patient were reviewed by me and considered in my medical decision making (see chart for details).  Patient presents with abdominal pain and headache, found to have a potassium of 7.3 with some early EKG changes. Patient is at high risk for developing dysrhythmia, so ordered IV calcium bicarbonate insulin and glucose and albuterol neb. Oral Kayexalate. X-ray obtained for the abdominal pain no evidence of obstruction or perforation. Possibly due to ascites. I have ordered IV ceftriaxone for empiric treatment of possible SBP. He has had paracentesis labs consistent with SBP in the past. I feel it is unsafe to do a diagnostic currently due to his acute illness with hyperkalemia.   case discussed with nephrology who will arrange dialysis. Critical care admitting team paged at [phone].      Clinical Course as of Oct 29 1339  Tue Oct 28, 2016  1250 Hyperk with ekg changes. Will temporize with medications and plan to admit.   [PS]  4132 D/w Dr. Candiss Norse - nephro. Will arrange for emergent HD in ICU today.   [PS]    Clinical Course User Index [PS] Carrie Mew, MD     ____________________________________________   FINAL CLINICAL IMPRESSION(S) / ED DIAGNOSES  Final diagnoses:  Pain of upper abdomen  Acute hyperkalemia  End stage renal disease ([place])      New Prescriptions   No  medications on file     Portions of this note were generated with dragon dictation software. Dictation errors may occur despite best attempts at proofreading.    Carrie Mew, MD 10/28/16 [phone]

## 2016-10-28 NOTE — H&P (Signed)
[facility] [place] NAME: Mario [name]    MR#:  [id number]  DATE OF BIRTH:  03/31/1971  DATE OF ADMISSION:  10/28/2016  PRIMARY CARE PHYSICIAN: [name], MD   REQUESTING/REFERRING PHYSICIAN: Dr. Joni Fears  CHIEF COMPLAINT: Abdominal pain    Chief Complaint  Patient presents with  . Abdominal Pain  . Headache    HISTORY OF PRESENT ILLNESS:  Mario [name]  is a 46 y.o. male with a known history of Surgery on hemodialysis was over at dialysis today but because of severe abdominal pain, headache he was sent here. Patient having headache for the past 23 days without nausea or vomiting. No blurred vision.  also has severe abdominal pain mainly in epigastric area, patient has ascites, liver cirrhosis noted he has low-grade temperature at home. Has severe hyperkalemia potassium 7.3 on the labs with EKG changes with the peak T waves in lead V4, V5, V6. She received calcium gluconate 1 amp, D50 1 amp, sodium bicarbonate 50 MG Q, Kayexalate 30 gm in ER.  Has severe abdominal pain and received2 mg of Dilaudid. Severe abdominal pain 10 out of 10. In epigastric area not radiating to the back, no nausea or vomiting. Does have ascites. PAST MEDICAL HISTORY:   Past Medical History:  Diagnosis Date  . Chronic combined systolic and diastolic CHF (congestive heart failure) ([place])   . Cirrhosis ([place])   . Dialysis patient ([place])   . Hypertension   . Renal disorder   . Renal insufficiency     PAST SURGICAL HISTOIRY:   Past Surgical History:  Procedure Laterality Date  . AV FISTULA PLACEMENT      SOCIAL HISTORY:   Social History  Substance Use Topics  . Smoking status: Current Every Day Smoker    Packs/day: 0.25    Types: Cigarettes  . Smokeless tobacco: Never Used  . Alcohol use No     Comment: 1/2 bottle tequilla per day    FAMILY HISTORY:   Family History  Problem Relation Age of Onset  . Kidney failure Father   .  Diabetes Mother     DRUG ALLERGIES:  No Known Allergies  REVIEW OF SYSTEMS:  CONSTITUTIONAL: No fever, fatigue or weakness. He is asking for pain medicine because of severe abdominal pain. EYES: No blurred or double vision.  EARS, NOSE, AND THROAT: No tinnitus or ear pain.  RESPIRATORY: No cough, shortness of breath, wheezing or hemoptysis.  CARDIOVASCULAR: No chest pain, orthopnea, edema.  GASTROINTESTINAL: No nausea or vomiting but severe abdominal pain in epigastric area but not radiating to the back.  GENITOURINARY: No dysuria, hematuria.  ENDOCRINE: No polyuria, nocturia,  HEMATOLOGY: No anemia, easy bruising or bleeding SKIN: No rash or lesion. MUSCULOSKELETAL: No joint pain or arthritis.   NEUROLOGIC: No tingling, numbness, weakness.  PSYCHIATRY: No anxiety or depression.   MEDICATIONS AT HOME:   Prior to Admission medications   Medication Sig Start Date End Date Taking? Authorizing Provider  amLODipine (NORVASC) 10 MG tablet Take 1 tablet (10 mg total) by mouth daily. Patient not taking: Reported on 10/13/2016 08/29/16   Hillary Bow, MD  calcium acetate (PHOSLO) 667 MG capsule Take 2 capsules (1,334 mg total) by mouth 3 (three) times daily with meals. Patient not taking: Reported on 10/28/2016 10/14/16   Fritzi Mandes, MD  losartan (COZAAR) 100 MG tablet Take 1 tablet (100 mg total) by mouth daily. Patient not taking: Reported on 10/28/2016 10/14/16   Sona  Posey Pronto, MD  metoprolol tartrate (LOPRESSOR) 25 MG tablet Take 1 tablet (25 mg total) by mouth 2 (two) times daily. Patient not taking: Reported on 10/13/2016 04/16/16   Henreitta Leber, MD  pantoprazole (PROTONIX) 40 MG tablet Take 1 tablet (40 mg total) by mouth daily. Patient not taking: Reported on 10/28/2016 10/14/16   Fritzi Mandes, MD  traMADol (ULTRAM) 50 MG tablet Take 1 tablet (50 mg total) by mouth every 12 (twelve) hours as needed for moderate pain. Patient not taking: Reported on 10/13/2016 08/30/16   Gladstone Lighter, MD       VITAL SIGNS:  Blood pressure (!) 160/79, pulse 77, temperature 97.5 F (36.4 C), temperature source Oral, resp. rate 18, height 5\' 2"  (1.575 m), weight 78.5 kg (173 lb), SpO2 100 %.  PHYSICAL EXAMINATION:  GENERAL:  46 y.o.-year-old patient lying in the bed with no acute distress.  EYES: Pupils equal, round, reactive to light and accommodation. No scleral icterus. Extraocular muscles intact.  HEENT: Head atraumatic, normocephalic. Oropharynx and nasopharynx clear.  NECK:  Supple, no jugular venous distention. No thyroid enlargement, no tenderness.  LUNGS: Normal breath sounds bilaterally, no wheezing, rales,rhonchi or crepitation. No use of accessory muscles of respiration.  CARDIOVASCULAR: S1, S2 normal. No murmurs, rubs, or gallops.  ABDOMEN: Patient has ascites, fluid thrill, or tenderness in epigastric area. Bowel sounds present  EXTREMITIES: trace pedal edema, cyanosis, or clubbing.  NEUROLOGIC: Cranial nerves II through XII are intact. Muscle strength 5/5 in all extremities. Sensation intact. Gait not checked.  PSYCHIATRIC: The patient is alert and oriented x 3.  SKIN: No obvious rash, lesion, or ulcer.   LABORATORY PANEL:   CBC  Recent Labs Lab 10/28/16 1211  WBC 5.2  HGB 13.1  HCT 39.7*  PLT 149*   ------------------------------------------------------------------------------------------------------------------  Chemistries   Recent Labs Lab 10/28/16 1211  NA 138  K 7.3*  CL 100*  CO2 22  GLUCOSE 106*  BUN 66*  CREATININE 14.26*  CALCIUM 9.3  AST 26  ALT 17  ALKPHOS 185*  BILITOT 1.0   ------------------------------------------------------------------------------------------------------------------  Cardiac Enzymes No results for input(s): TROPONINI in the last 168 hours. ------------------------------------------------------------------------------------------------------------------  RADIOLOGY:  Dg Abdomen Acute W/chest  Result Date:  10/28/2016 CLINICAL DATA:  Unable to fully dialysis due to abdominal pain. EXAM: DG ABDOMEN ACUTE W/ 1V CHEST COMPARISON:  CT scan 10/13/2016 FINDINGS: The upright chest x-ray demonstrates mild cardiac enlargement. Right paratracheal density appears stable when compared to prior chest x-rays and is likely in part due to a large azygos vein as demonstrated on a prior chest CT. Low lung volumes with vascular crowding and areas of atelectasis. No definite pleural effusions. Abdominal films demonstrate an unremarkable bowel gas pattern. No findings for obstruction or perforation. Increased density and poor definition of the soft tissue shadows suggesting ascites from patient's known cirrhosis. The bony structures are intact. IMPRESSION: No acute cardiopulmonary findings.  Stable cardiac enlargement. No findings for obstruction or perforation. Suspect abdominal ascites. Electronically Signed   By: Marijo Sanes M.D.   On: 10/28/2016 13:27    EKG:   Orders placed or performed during the hospital encounter of 10/28/16  . EKG 12-Lead  . EKG 12-Lead  . EKG 12-Lead  . EKG 12-Lead  Normal sinus rhythm 68 beats, peaked T waves in lead V4, V5, V6.  IMPRESSION AND PLAN:  1 abdominal pain in the contest of liver cirrhosis and ascites: Patient was here in Jan ,march 12 th /2018  for the same, underwent paracentesis  that time they removed 2 L of fluid., Ordered US guided paracentesis for tomorrow. Continue IV pain medicine, prophylactic antibiotics because of his low-grade fever abdominal pain for possible SBP. #2 severe hyperkalemia with EKG changes history of ESRD: Patient needs an emergency hemodialysis, discuss withA nephrologist Dr.Harmeet  Candiss Norse, patient is admitted to ICU and having the dialysis  There. Repeat potassium after the dialysis. Patient received potassium shifting measures in the emergency room.  3 /epigastric abdominal pain due to gastritis and ascites: Has poor social situation not able to afford  the medicines: Continue IV PPIs. Patient has no hematemesis. Will check CT abdomen without contrast. An emergency dialysis.Marland Kitchen  4. Essential hypertension: Hold the losartan because of hyperkalemia. Patient is a high risk for recurrent admissions because of his social situation, unable to afford the medication.  Marland Kitchenll the records are reviewed and case discussed with ED provider. Management plans discussed with the patient, family and they are in agreement.  CODE STATUS: full  TOTAL TIME TAKING CARE OF THIS PATIENT: 55 minutes. (Critical care)   Polly Barner M.D on 10/28/2016 at 4:15 PM  Between 7am to 6pm - Pager - [phone]  After 6pm go to [email or web address] - password [email or web address] [facility]  Office  [phone]  CC: Primary care physician; [name], MD  Note: This dictation was prepared with Dragon dictation along with smaller phrase technology. Any transcriptional errors that result from this process are unintentional.

## 2016-10-28 NOTE — Progress Notes (Signed)
Subjective:   Patient known to our practice from recent admissions He presents for severe abdominal pain and distention  No nausea, vomiting or fever Evaluation in the emergency room reveals potassium of 7.3 Emergent dialysis requested  Objective:  Vital signs in last 24 hours:  Temp:  [97.5 F (36.4 C)] 97.5 F (36.4 C) (03/27 1215) Pulse Rate:  [66-90] 77 (03/27 1630) Resp:  [15-20] 15 (03/27 1630) BP: (138-184)/(64-87) 142/81 (03/27 1630) SpO2:  [93 %-100 %] 96 % (03/27 1630) Weight:  [78.5 kg (173 lb)] 78.5 kg (173 lb) (03/27 1216)  Weight change:  Filed Weights   10/28/16 1216  Weight: 78.5 kg (173 lb)    Intake/Output:   No intake or output data in the 24 hours ending 10/28/16 1649   Physical Exam: General: Laying in the bed in the emergency room, appears uncomfortable due to abdominal pain,   HEENT Moist oral mucous membranes  Neck Supple  Pulm/lungs Normal breathing effort, clear to auscultation  CVS/Heart Regular rhythm, no rub or gallop  Abdomen:  Distended, ascites, tender  Extremities: No edema  Neurologic: Alert, able to answer questions  Skin: No acute rashes  Access: AV fistula       Basic Metabolic Panel:   Recent Labs Lab 10/28/16 1211  NA 138  K 7.3*  CL 100*  CO2 22  GLUCOSE 106*  BUN 66*  CREATININE 14.26*  CALCIUM 9.3     CBC:  Recent Labs Lab 10/28/16 1211  WBC 5.2  HGB 13.1  HCT 39.7*  MCV 99.8  PLT 149*      Microbiology:  Recent Results (from the past 720 hour(s))  MRSA PCR Screening     Status: None   Collection Time: 10/14/16  9:01 AM  Result Value Ref Range Status   MRSA by PCR NEGATIVE NEGATIVE Final    Comment:        The GeneXpert MRSA Assay (FDA approved for NASAL specimens only), is one component of a comprehensive MRSA colonization surveillance program. It is not intended to diagnose MRSA infection nor to guide or monitor treatment for MRSA infections.   Body fluid culture     Status: None    Collection Time: 10/14/16  2:30 PM  Result Value Ref Range Status   Specimen Description PERITONEAL  Final   Special Requests NONE  Final   Gram Stain   Final    ABUNDANT WBC PRESENT,BOTH PMN AND MONONUCLEAR NO ORGANISMS SEEN    Culture   Final    NO GROWTH 3 DAYS Performed at [facility], [address] [place], [place] [address]    Report Status 10/17/2016 FINAL  Final    Coagulation Studies: No results for input(s): LABPROT, INR in the last 72 hours.  Urinalysis: No results for input(s): COLORURINE, LABSPEC, PHURINE, GLUCOSEU, HGBUR, BILIRUBINUR, KETONESUR, PROTEINUR, UROBILINOGEN, NITRITE, LEUKOCYTESUR in the last 72 hours.  Invalid input(s): APPERANCEUR    Imaging: Dg Abdomen Acute W/chest  Result Date: 10/28/2016 CLINICAL DATA:  Unable to fully dialysis due to abdominal pain. EXAM: DG ABDOMEN ACUTE W/ 1V CHEST COMPARISON:  CT scan 10/13/2016 FINDINGS: The upright chest x-ray demonstrates mild cardiac enlargement. Right paratracheal density appears stable when compared to prior chest x-rays and is likely in part due to a large azygos vein as demonstrated on a prior chest CT. Low lung volumes with vascular crowding and areas of atelectasis. No definite pleural effusions. Abdominal films demonstrate an unremarkable bowel gas pattern. No findings for obstruction or  perforation. Increased density and poor definition of the soft tissue shadows suggesting ascites from patient's known cirrhosis. The bony structures are intact. IMPRESSION: No acute cardiopulmonary findings.  Stable cardiac enlargement. No findings for obstruction or perforation. Suspect abdominal ascites. Electronically Signed   By: Marijo Sanes M.D.   On: 10/28/2016 13:27     Medications:    . amLODipine  10 mg Oral Daily  . calcium acetate  1,334 mg Oral TID WC  . docusate sodium  100 mg Oral BID  . heparin  5,000 Units Subcutaneous Q8H  . metoprolol tartrate  25 mg Oral BID  . pantoprazole  40 mg  Oral Daily   HYDROcodone-acetaminophen, ondansetron **OR** ondansetron (ZOFRAN) IV  Assessment/ Plan:  46 y.o.Hispanic male  with end-stage renal disease, hypertension, Alcohol inducedcirrhosis of the liver, ascites  [facility] nephrology, [place]., Tuesday, Thursday, Saturday  1. End-stage renal disease 2. Severe Hyperkalemia 3. Ascites from alcoholic liver cirrhosis and abdominal pain 4. SHPTH 5. HTN 6. Anemia of CKD  Plan: Emergent hemodialysis with 1K bath, follow serial potassiums Patient has received SPS in the emergency room Does antihypertensives Continue binders with meals urrent hemoglobin 13.1, hold EPO Consider abdominal ascitic tap       LOS: 0 [place] K[place] 3/27/20184:49 PM

## 2016-10-28 NOTE — Progress Notes (Signed)
This note also relates to the following rows which could not be included: Pulse Rate - Cannot attach notes to unvalidated device data BP - Cannot attach notes to unvalidated device data SpO2 - Cannot attach notes to unvalidated device data  Post hd vitals 

## 2016-10-28 NOTE — Progress Notes (Signed)
  End of hd 

## 2016-10-28 NOTE — ED Notes (Signed)
Pt calling out, reports "mucho pain"  Spoke with dr Joni Fears, orders for dialudid received for pt.

## 2016-10-28 NOTE — Progress Notes (Signed)
Pre hd assessmenht

## 2016-10-28 NOTE — Progress Notes (Addendum)
Potassium result 4.1. Pt now on 2K acid bath remainder of HD

## 2016-10-28 NOTE — Progress Notes (Signed)
Pre hd info 

## 2016-10-28 NOTE — Progress Notes (Signed)
Pt requesting to end HD early. Agreed to dialyze for 30 more minutes. MD notified.

## 2016-10-29 ENCOUNTER — Inpatient Hospital Stay: Payer: Medicaid Other

## 2016-10-29 LAB — BODY FLUID CELL COUNT WITH DIFFERENTIAL
EOS FL: 0 %
LYMPHS FL: 8 %
MONOCYTE-MACROPHAGE-SEROUS FLUID: 83 %
NEUTROPHIL FLUID: 9 %
Total Nucleated Cell Count, Fluid: 453 cu mm

## 2016-10-29 LAB — CBC
HCT: 31.9 % — ABNORMAL LOW (ref 40.0–52.0)
Hemoglobin: 10.5 g/dL — ABNORMAL LOW (ref 13.0–18.0)
MCH: 32.1 pg (ref 26.0–34.0)
MCHC: 32.8 g/dL (ref 32.0–36.0)
MCV: 97.8 fL (ref 80.0–100.0)
PLATELETS: 129 10*3/uL — AB (ref 150–440)
RBC: 3.26 MIL/uL — ABNORMAL LOW (ref 4.40–5.90)
RDW: 15.8 % — AB (ref 11.5–14.5)
WBC: 4.4 10*3/uL (ref 3.8–10.6)

## 2016-10-29 LAB — BASIC METABOLIC PANEL
Anion gap: 11 (ref 5–15)
BUN: 42 mg/dL — AB (ref 6–20)
CHLORIDE: 99 mmol/L — AB (ref 101–111)
CO2: 29 mmol/L (ref 22–32)
CREATININE: 10.54 mg/dL — AB (ref 0.61–1.24)
Calcium: 8.7 mg/dL — ABNORMAL LOW (ref 8.9–10.3)
GFR calc Af Amer: 6 mL/min — ABNORMAL LOW (ref >60)
GFR calc non Af Amer: 5 mL/min — ABNORMAL LOW (ref >60)
GLUCOSE: 86 mg/dL (ref 65–99)
Potassium: 4.4 mmol/L (ref 3.5–5.1)
SODIUM: 139 mmol/L (ref 135–145)

## 2016-10-29 LAB — ALBUMIN, PLEURAL OR PERITONEAL FLUID: ALBUMIN FL: 2.1 g/dL

## 2016-10-29 LAB — PATHOLOGIST SMEAR REVIEW

## 2016-10-29 LAB — HIV ANTIBODY (ROUTINE TESTING W REFLEX): HIV SCREEN 4TH GENERATION: NONREACTIVE

## 2016-10-29 MED ORDER — FENTANYL CITRATE (PF) 100 MCG/2ML IJ SOLN
25.0000 ug | INTRAMUSCULAR | Status: DC | PRN
  Administered 2016-10-29: 50 ug via INTRAVENOUS
  Filled 2016-10-29: qty 2

## 2016-10-29 MED ORDER — MORPHINE SULFATE (PF) 4 MG/ML IV SOLN
2.0000 mg | INTRAVENOUS | Status: DC | PRN
  Administered 2016-10-29 – 2016-10-30 (×4): 2 mg via INTRAVENOUS
  Filled 2016-10-29 (×4): qty 1

## 2016-10-29 NOTE — Progress Notes (Signed)
Patient alert and oriented. Vitals stable- patient complaining of abdominal pain throughout the shift- he stated that it is located all over his abdomen but mainly on the top midline of his abdomen.  Dr. Verdell Carmine aware and I stated that patient is receiving pain medicine almost every 2 hours.  Patient had paracentesis performed and 1 liter was pulled off.

## 2016-10-29 NOTE — Progress Notes (Signed)
Subjective:   Patient known to our practice from recent admissions He presents for severe abdominal pain and distention  Underwent emergent dialysis yesterday for hyperkalemia. Potassium was corrected to 4.1 yesterday evening This morning, potassium is 4.4 Patient continues to report abdominal pain,  paracentesis today  Objective:  Vital signs in last 24 hours:  Temp:  [97.4 F (36.3 C)-98.3 F (36.8 C)] 97.9 F (36.6 C) (03/28 0852) Pulse Rate:  [59-90] 66 (03/28 1111) Resp:  [10-25] 10 (03/28 0400) BP: (111-192)/(62-112) 165/83 (03/28 1100) SpO2:  [93 %-100 %] 100 % (03/28 1111) Weight:  [81 kg (178 lb 9.2 oz)-81.5 kg (179 lb 10.8 oz)] 81 kg (178 lb 9.2 oz) (03/27 1945)  Weight change:  Filed Weights   10/28/16 1216 10/28/16 1730 10/28/16 1945  Weight: 78.5 kg (173 lb) 81.5 kg (179 lb 10.8 oz) 81 kg (178 lb 9.2 oz)    Intake/Output:    Intake/Output Summary (Last 24 hours) at 10/29/16 1244 Last data filed at 10/28/16 1945  Gross per 24 hour  Intake                0 ml  Output              600 ml  Net             -600 ml     Physical Exam: General: Laying in the bed in the emergency room, appears uncomfortable due to abdominal pain,   HEENT Moist oral mucous membranes  Neck Supple  Pulm/lungs Normal breathing effort, clear to auscultation  CVS/Heart Regular rhythm, no rub or gallop  Abdomen:  Distended, ascites, tender  Extremities: No edema  Neurologic: Alert, able to answer questions  Skin: No acute rashes  Access: AV fistula       Basic Metabolic Panel:   Recent Labs Lab 10/28/16 1211 10/28/16 1739 10/28/16 1844 10/29/16 0345  NA 138  --   --  139  K 7.3* 6.2* 4.1 4.4  CL 100*  --   --  99*  CO2 22  --   --  29  GLUCOSE 106*  --   --  86  BUN 66*  --   --  42*  CREATININE 14.26*  --   --  10.54*  CALCIUM 9.3  --   --  8.7*     CBC:  Recent Labs Lab 10/28/16 1211 10/29/16 0345  WBC 5.2 4.4  HGB 13.1 10.5*  HCT 39.7* 31.9*  MCV  99.8 97.8  PLT 149* 129*      Microbiology:  Recent Results (from the past 720 hour(s))  MRSA PCR Screening     Status: None   Collection Time: 10/14/16  9:01 AM  Result Value Ref Range Status   MRSA by PCR NEGATIVE NEGATIVE Final    Comment:        The GeneXpert MRSA Assay (FDA approved for NASAL specimens only), is one component of a comprehensive MRSA colonization surveillance program. It is not intended to diagnose MRSA infection nor to guide or monitor treatment for MRSA infections.   Body fluid culture     Status: None   Collection Time: 10/14/16  2:30 PM  Result Value Ref Range Status   Specimen Description PERITONEAL  Final   Special Requests NONE  Final   Gram Stain   Final    ABUNDANT WBC PRESENT,BOTH PMN AND MONONUCLEAR NO ORGANISMS SEEN    Culture   Final    NO GROWTH 3  DAYS Performed at [facility], [place] [address] [place], [place] [address]    Report Status 10/17/2016 FINAL  Final  MRSA PCR Screening     Status: None   Collection Time: 10/28/16  5:20 PM  Result Value Ref Range Status   MRSA by PCR NEGATIVE NEGATIVE Final    Comment:        The GeneXpert MRSA Assay (FDA approved for NASAL specimens only), is one component of a comprehensive MRSA colonization surveillance program. It is not intended to diagnose MRSA infection nor to guide or monitor treatment for MRSA infections.     Coagulation Studies: No results for input(s): LABPROT, INR in the last 72 hours.  Urinalysis: No results for input(s): COLORURINE, LABSPEC, PHURINE, GLUCOSEU, HGBUR, BILIRUBINUR, KETONESUR, PROTEINUR, UROBILINOGEN, NITRITE, LEUKOCYTESUR in the last 72 hours.  Invalid input(s): APPERANCEUR    Imaging: US Paracentesis  Result Date: 10/29/2016 INDICATION: Ascites EXAM: ULTRASOUND GUIDED PARACENTESIS MEDICATIONS: None. COMPLICATIONS: None immediate. PROCEDURE: Informed written consent was obtained from the patient after a discussion of the risks, benefits  and alternatives to treatment. A timeout was performed prior to the initiation of the procedure. Initial ultrasound scanning demonstrates a large amount of ascites within the right abdomen. The right abdomen was prepped and draped in the usual sterile fashion. 1% lidocaine with epinephrine was used for local anesthesia. Following this, utilizing real-time ultrasound guidance a 6 Fr Safe-T-Centesis catheter was introduced. An ultrasound image was saved for documentation purposes. The paracentesis was performed. The catheter was removed and a dressing was applied. The patient tolerated the procedure well without immediate post procedural complication. FINDINGS: A total of approximately 1 L of clear yellow fluid was removed. Samples were sent to the laboratory as requested by the clinical team. IMPRESSION: Successful ultrasound-guided paracentesis yielding 1 liter of peritoneal fluid. The procedure was terminated at the patient's request prior to complete evacuation of the ascites ANESTHESIA/SEDATION: None Electronically Signed   By: Inez Catalina M.D.   On: 10/29/2016 10:50   Dg Abdomen Acute W/chest  Result Date: 10/28/2016 CLINICAL DATA:  Unable to fully dialysis due to abdominal pain. EXAM: DG ABDOMEN ACUTE W/ 1V CHEST COMPARISON:  CT scan 10/13/2016 FINDINGS: The upright chest x-ray demonstrates mild cardiac enlargement. Right paratracheal density appears stable when compared to prior chest x-rays and is likely in part due to a large azygos vein as demonstrated on a prior chest CT. Low lung volumes with vascular crowding and areas of atelectasis. No definite pleural effusions. Abdominal films demonstrate an unremarkable bowel gas pattern. No findings for obstruction or perforation. Increased density and poor definition of the soft tissue shadows suggesting ascites from patient's known cirrhosis. The bony structures are intact. IMPRESSION: No acute cardiopulmonary findings.  Stable cardiac enlargement. No  findings for obstruction or perforation. Suspect abdominal ascites. Electronically Signed   By: Marijo Sanes M.D.   On: 10/28/2016 13:27     Medications:    . amLODipine  10 mg Oral Daily  . calcium acetate  1,334 mg Oral TID WC  . docusate sodium  100 mg Oral BID  . heparin  5,000 Units Subcutaneous Q8H  . metoprolol tartrate  25 mg Oral BID  . pantoprazole  40 mg Oral Daily   HYDROcodone-acetaminophen, morphine injection, ondansetron **OR** ondansetron (ZOFRAN) IV  Assessment/ Plan:  46 y.o.Hispanic male  with end-stage renal disease, hypertension, Alcohol inducedcirrhosis of the liver, ascites  [facility] nephrology, [place]., Tuesday, Thursday, Saturday  1. End-stage renal disease 2.  Severe Hyperkalemia 3. Ascites from alcoholic liver cirrhosis and abdominal pain 4. SHPTH 5. HTN 6. Anemia of CKD  Plan: Emergent hemodialysis Done on 3/27 Continue binders with meals current hemoglobin 10.5 Paracentesis today      LOS: 1 Nikkita Adeyemi 3/28/201812:44 PM

## 2016-10-29 NOTE — Progress Notes (Signed)
[facility] [place] NAME: Mario [name]    MR#:  [id number]  DATE OF BIRTH:  Sep 21, 1970  SUBJECTIVE:   She here due to abdominal pain and also noted to be severely hyperkalemic. Hyperkalemia has improved and resolved with hemodialysis. Still complaining of abdominal pain. s/p US guided paracentesis with 1 L of fluid removed.  REVIEW OF SYSTEMS:    Review of Systems  Constitutional: Negative for chills and fever.  HENT: Negative for congestion and tinnitus.   Eyes: Negative for blurred vision and double vision.  Respiratory: Negative for cough, shortness of breath and wheezing.   Cardiovascular: Negative for chest pain, orthopnea and PND.  Gastrointestinal: Positive for abdominal pain. Negative for diarrhea, nausea and vomiting.  Genitourinary: Negative for dysuria and hematuria.  Neurological: Negative for dizziness, sensory change and focal weakness.  All other systems reviewed and are negative.   Nutrition: REnal w/ fluid restriction Tolerating Diet: Yes Tolerating PT: Ambulatory   DRUG ALLERGIES:  No Known Allergies  VITALS:  Blood pressure 140/67, pulse 63, temperature 98 F (36.7 C), temperature source Oral, resp. rate 20, height 5\' 2"  (1.575 m), weight 81 kg (178 lb 9.2 oz), SpO2 100 %.  PHYSICAL EXAMINATION:   Physical Exam  GENERAL:  46 y.o.-year-old patient lying in the bed in no acute distress.  EYES: Pupils equal, round, reactive to light and accommodation. No scleral icterus. Extraocular muscles intact.  HEENT: Head atraumatic, normocephalic. Oropharynx and nasopharynx clear.  NECK:  Supple, no jugular venous distention. No thyroid enlargement, no tenderness.  LUNGS: Normal breath sounds bilaterally, no wheezing, rales, rhonchi. No use of accessory muscles of respiration.  CARDIOVASCULAR: S1, S2 normal. II/VI SEM at base. No rubs, or gallops.  ABDOMEN: Soft, Distended, diffusely tender but no rebound rigidity,  + fluid wave consistent w/ Ascites. Bowel sounds present. No organomegaly or mass.  EXTREMITIES: No cyanosis, clubbing or edema b/l.    NEUROLOGIC: Cranial nerves II through XII are intact. No focal Motor or sensory deficits b/l.   PSYCHIATRIC: The patient is alert and oriented x 3.  SKIN: No obvious rash, lesion, or ulcer.    LABORATORY PANEL:   CBC  Recent Labs Lab 10/29/16 0345  WBC 4.4  HGB 10.5*  HCT 31.9*  PLT 129*   ------------------------------------------------------------------------------------------------------------------  Chemistries   Recent Labs Lab 10/28/16 1211  10/29/16 0345  NA 138  --  139  K 7.3*  < > 4.4  CL 100*  --  99*  CO2 22  --  29  GLUCOSE 106*  --  86  BUN 66*  --  42*  CREATININE 14.26*  --  10.54*  CALCIUM 9.3  --  8.7*  AST 26  --   --   ALT 17  --   --   ALKPHOS 185*  --   --   BILITOT 1.0  --   --   < > = values in this interval not displayed. ------------------------------------------------------------------------------------------------------------------  Cardiac Enzymes No results for input(s): TROPONINI in the last 168 hours. ------------------------------------------------------------------------------------------------------------------  RADIOLOGY:  US Paracentesis  Result Date: 10/29/2016 INDICATION: Ascites EXAM: ULTRASOUND GUIDED PARACENTESIS MEDICATIONS: None. COMPLICATIONS: None immediate. PROCEDURE: Informed written consent was obtained from the patient after a discussion of the risks, benefits and alternatives to treatment. A timeout was performed prior to the initiation of the procedure. Initial ultrasound scanning demonstrates a large amount of ascites within the right abdomen. The right abdomen was prepped and draped in  the usual sterile fashion. 1% lidocaine with epinephrine was used for local anesthesia. Following this, utilizing real-time ultrasound guidance a 6 Fr Safe-T-Centesis catheter was introduced. An  ultrasound image was saved for documentation purposes. The paracentesis was performed. The catheter was removed and a dressing was applied. The patient tolerated the procedure well without immediate post procedural complication. FINDINGS: A total of approximately 1 L of clear yellow fluid was removed. Samples were sent to the laboratory as requested by the clinical team. IMPRESSION: Successful ultrasound-guided paracentesis yielding 1 liter of peritoneal fluid. The procedure was terminated at the patient's request prior to complete evacuation of the ascites ANESTHESIA/SEDATION: None Electronically Signed   By: Inez Catalina M.D.   On: 10/29/2016 10:50   Dg Abdomen Acute W/chest  Result Date: 10/28/2016 CLINICAL DATA:  Unable to fully dialysis due to abdominal pain. EXAM: DG ABDOMEN ACUTE W/ 1V CHEST COMPARISON:  CT scan 10/13/2016 FINDINGS: The upright chest x-ray demonstrates mild cardiac enlargement. Right paratracheal density appears stable when compared to prior chest x-rays and is likely in part due to a large azygos vein as demonstrated on a prior chest CT. Low lung volumes with vascular crowding and areas of atelectasis. No definite pleural effusions. Abdominal films demonstrate an unremarkable bowel gas pattern. No findings for obstruction or perforation. Increased density and poor definition of the soft tissue shadows suggesting ascites from patient's known cirrhosis. The bony structures are intact. IMPRESSION: No acute cardiopulmonary findings.  Stable cardiac enlargement. No findings for obstruction or perforation. Suspect abdominal ascites. Electronically Signed   By: Marijo Sanes M.D.   On: 10/28/2016 13:27     ASSESSMENT AND PLAN:   46 year old male with past medical history of liver cirrhosis, end-stage renal disease on hemodialysis, combined systolic and diastolic CHF, essential hypertension and presented to the hospital due to abdominal pain and also noted to have acute hyperkalemia.  1.  Abdominal pain-etiology unclear presently. Patient did have ascites and is status post ultrasound-guided paracentesis but the fluid analysis is not consistent with SBP. Continue supportive care with pain control, patient is clinically afebrile and hemodynamically stable. -If symptoms persist would consider gastroenterology consult.  2. Acute hyperkalemia -  Due to ESRD.  - pt. Had HD yesterday and potassium has normalized now.   3. End-stage renal disease on hemodialysis-continue dialysis on Monday Wednesday and Friday he follows with [facility] nephrology.  4. Essential hypertension-continue metoprolol, Norvasc  5. Secondary hyperparathyroidism-continue PhosLo.  6. GERD-continue Protonix.  All the records are reviewed and case discussed with Care Management/Social Worker. Management plans discussed with the patient, family and they are in agreement.  CODE STATUS: Full code  DVT Prophylaxis: Hep. SQ  TOTAL TIME TAKING CARE OF THIS PATIENT: 30 minutes.   POSSIBLE D/C IN 1-2 DAYS, DEPENDING ON CLINICAL CONDITION.   Henreitta Leber M.D on 10/29/2016 at 4:16 PM  Between 7am to 6pm - Pager - [phone]  After 6pm go to [email or web address] - Proofreader  [facility]  Office  [phone]  CC: Primary care physician; [name], MD

## 2016-10-29 NOTE — Procedures (Signed)
Paracentesis without difficulty  Complications:  None  Blood Loss: none  See dictation in canopy pacs

## 2016-10-29 NOTE — Care Management (Signed)
Notified Elvera Bicker with [facility] of admission.  Patient has been referred many times to [facility] and does not follow through.  Paracentesis  ordered. has received emergent dialysis.  Followed by [facility].

## 2016-10-30 LAB — HEPATITIS B SURFACE ANTIGEN: Hepatitis B Surface Ag: NEGATIVE

## 2016-10-30 LAB — GLUCOSE, CAPILLARY
Glucose-Capillary: 67 mg/dL (ref 65–99)
Glucose-Capillary: 89 mg/dL (ref 65–99)

## 2016-10-30 NOTE — Progress Notes (Signed)
tx paused

## 2016-10-30 NOTE — Progress Notes (Signed)
Patient came back from dialysis complaining of a really bad headache, Norco was given with some relief.  I talked to the patient via interpreter about being discharged this evening.  Patient stated that the friend that drives him around and that he lives with is at work now and will not get off until midnight tonight.  He stated he had no key and no one else to take him home.  He asked if he could stay until tomorrow.  I called Dr. Verdell Carmine and told him the situation and he said the patient could wait and be discharged in the morning, and that the MD did not need to see him before he left unless something changed.

## 2016-10-30 NOTE — Care Management (Signed)
All patient prescriptions were sent to [facility] 10/14/16.  [facility] closed at this time.  Unable to confirm if patient pick up scripts at discharge.  Elvera Bicker HD liaison notified of discharge.

## 2016-10-30 NOTE — Progress Notes (Signed)
tx restarted

## 2016-10-30 NOTE — Progress Notes (Signed)
Subjective:   Patient known to our practice from recent admissions He presents for severe abdominal pain and distention  Underwent emergent dialysis at admission for hyperkalemia. Potassium was corrected to 4.1    seen during HD Denies abdominal pain now   HEMODIALYSIS FLOWSHEET:  Blood Flow Rate (mL/min): 350 mL/min Arterial Pressure (mmHg): -160 mmHg Venous Pressure (mmHg): 170 mmHg Transmembrane Pressure (mmHg): 60 mmHg Ultrafiltration Rate (mL/min): 690 mL/min Dialysate Flow Rate (mL/min): 800 ml/min Conductivity: Machine : 15.2 Conductivity: Machine : 15.2 Dialysis Fluid Bolus: Normal Saline Bolus Amount (mL): 250 mL Dialysate Change: 2K Intra-Hemodialysis Comments: 600. alert, no c/o    Objective:  Vital signs in last 24 hours:  Temp:  [97.8 F (36.6 C)-98.2 F (36.8 C)] 97.8 F (36.6 C) (03/29 1135) Pulse Rate:  [63-71] 70 (03/29 1310) Resp:  [11-20] 14 (03/29 1310) BP: (124-145)/(67-76) 145/69 (03/29 1310) SpO2:  [95 %-100 %] 100 % (03/29 1310) Weight:  [81.3 kg (179 lb 3.2 oz)-82 kg (180 lb 12.4 oz)] 82 kg (180 lb 12.4 oz) (03/29 1135)  Weight change: 2.812 kg (6 lb 3.2 oz) Filed Weights   10/28/16 1945 10/30/16 0407 10/30/16 1135  Weight: 81 kg (178 lb 9.2 oz) 81.3 kg (179 lb 3.2 oz) 82 kg (180 lb 12.4 oz)    Intake/Output:    Intake/Output Summary (Last 24 hours) at 10/30/16 1353 Last data filed at 10/30/16 0830  Gross per 24 hour  Intake              462 ml  Output                0 ml  Net              462 ml     Physical Exam: General: Laying in the bed   HEENT Moist oral mucous membranes  Neck Supple  Pulm/lungs Normal breathing effort, clear to auscultation  CVS/Heart Regular rhythm, no rub or gallop  Abdomen:  Distended, ascites,  soft  Extremities: No edema  Neurologic: Alert, able to answer questions  Skin: No acute rashes  Access: AV fistula       Basic Metabolic Panel:   Recent Labs Lab 10/28/16 1211 10/28/16 1739  10/28/16 1844 10/29/16 0345  NA 138  --   --  139  K 7.3* 6.2* 4.1 4.4  CL 100*  --   --  99*  CO2 22  --   --  29  GLUCOSE 106*  --   --  86  BUN 66*  --   --  42*  CREATININE 14.26*  --   --  10.54*  CALCIUM 9.3  --   --  8.7*     CBC:  Recent Labs Lab 10/28/16 1211 10/29/16 0345  WBC 5.2 4.4  HGB 13.1 10.5*  HCT 39.7* 31.9*  MCV 99.8 97.8  PLT 149* 129*      Microbiology:  Recent Results (from the past 720 hour(s))  MRSA PCR Screening     Status: None   Collection Time: 10/14/16  9:01 AM  Result Value Ref Range Status   MRSA by PCR NEGATIVE NEGATIVE Final    Comment:        The GeneXpert MRSA Assay (FDA approved for NASAL specimens only), is one component of a comprehensive MRSA colonization surveillance program. It is not intended to diagnose MRSA infection nor to guide or monitor treatment for MRSA infections.   Body fluid culture     Status: None  Collection Time: 10/14/16  2:30 PM  Result Value Ref Range Status   Specimen Description PERITONEAL  Final   Special Requests NONE  Final   Gram Stain   Final    ABUNDANT WBC PRESENT,BOTH PMN AND MONONUCLEAR NO ORGANISMS SEEN    Culture   Final    NO GROWTH 3 DAYS Performed at [facility], [address] [place], [place] [address]    Report Status 10/17/2016 FINAL  Final  MRSA PCR Screening     Status: None   Collection Time: 10/28/16  5:20 PM  Result Value Ref Range Status   MRSA by PCR NEGATIVE NEGATIVE Final    Comment:        The GeneXpert MRSA Assay (FDA approved for NASAL specimens only), is one component of a comprehensive MRSA colonization surveillance program. It is not intended to diagnose MRSA infection nor to guide or monitor treatment for MRSA infections.   Body fluid culture     Status: None (Preliminary result)   Collection Time: 10/29/16  9:56 AM  Result Value Ref Range Status   Specimen Description PERITONEAL  Final   Special Requests NONE  Final   Gram Stain    Final    MODERATE WBC PRESENT, PREDOMINANTLY MONONUCLEAR NO ORGANISMS SEEN    Culture   Final    NO GROWTH < 24 HOURS Performed at [facility] [address], [place], [place] [address]    Report Status PENDING  Incomplete    Coagulation Studies: No results for input(s): LABPROT, INR in the last 72 hours.  Urinalysis: No results for input(s): COLORURINE, LABSPEC, PHURINE, GLUCOSEU, HGBUR, BILIRUBINUR, KETONESUR, PROTEINUR, UROBILINOGEN, NITRITE, LEUKOCYTESUR in the last 72 hours.  Invalid input(s): APPERANCEUR    Imaging: US Paracentesis  Result Date: 10/29/2016 INDICATION: Ascites EXAM: ULTRASOUND GUIDED PARACENTESIS MEDICATIONS: None. COMPLICATIONS: None immediate. PROCEDURE: Informed written consent was obtained from the patient after a discussion of the risks, benefits and alternatives to treatment. A timeout was performed prior to the initiation of the procedure. Initial ultrasound scanning demonstrates a large amount of ascites within the right abdomen. The right abdomen was prepped and draped in the usual sterile fashion. 1% lidocaine with epinephrine was used for local anesthesia. Following this, utilizing real-time ultrasound guidance a 6 Fr Safe-T-Centesis catheter was introduced. An ultrasound image was saved for documentation purposes. The paracentesis was performed. The catheter was removed and a dressing was applied. The patient tolerated the procedure well without immediate post procedural complication. FINDINGS: A total of approximately 1 L of clear yellow fluid was removed. Samples were sent to the laboratory as requested by the clinical team. IMPRESSION: Successful ultrasound-guided paracentesis yielding 1 liter of peritoneal fluid. The procedure was terminated at the patient's request prior to complete evacuation of the ascites ANESTHESIA/SEDATION: None Electronically Signed   By: Inez Catalina M.D.   On: 10/29/2016 10:50     Medications:    . amLODipine  10 mg  Oral Daily  . calcium acetate  1,334 mg Oral TID WC  . docusate sodium  100 mg Oral BID  . heparin  5,000 Units Subcutaneous Q8H  . metoprolol tartrate  25 mg Oral BID  . pantoprazole  40 mg Oral Daily   HYDROcodone-acetaminophen, morphine injection, ondansetron **OR** ondansetron (ZOFRAN) IV  Assessment/ Plan:  46 y.o.Hispanic male  with end-stage renal disease, hypertension, Alcohol inducedcirrhosis of the liver, ascites  [facility] nephrology, [place]., Tuesday, Thursday, Saturday  1. End-stage renal disease 2.  Severe Hyperkalemia 3. Ascites from alcoholic liver cirrhosis and abdominal pain 4. SHPTH 5. HTN 6. Anemia of CKD  Plan: Emergent hemodialysis Done on 3/27 Continue binders with meals current hemoglobin 10.5 Paracentesis done this Admission; WBC count high at 453 but 83 %macrophages suggesting chronic inflammation neutrophils 9% . Mgmt as per IM team      LOS: 2 Jazion Atteberry 3/29/20181:53 PM

## 2016-10-30 NOTE — Progress Notes (Signed)
Pre hd info 

## 2016-10-30 NOTE — Discharge Summary (Signed)
[facility] NAME: Westly Hinnant    MR#:  [id number]  DATE OF BIRTH:  [birth year]  DATE OF ADMISSION:  10/28/2016 ADMITTING PHYSICIAN: Epifanio Lesches, MD  DATE OF DISCHARGE: 10/30/2016  PRIMARY CARE PHYSICIAN: [name], MD    ADMISSION DIAGNOSIS:  End stage renal disease ([place]) [N18.6] Acute hyperkalemia [E87.5] Pain of upper abdomen [R10.10] Ascites [R18.8]  DISCHARGE DIAGNOSIS:  Active Problems:   Acute hyperkalemia   SECONDARY DIAGNOSIS:   Past Medical History:  Diagnosis Date  . Chronic combined systolic and diastolic CHF (congestive heart failure) ([place])   . Cirrhosis ([place])   . Dialysis patient ([place])   . Hypertension   . Renal disorder   . Renal insufficiency     HOSPITAL COURSE:   46 year old male with past medical history of liver cirrhosis, end-stage renal disease on hemodialysis, combined systolic and diastolic CHF, essential hypertension and presented to the hospital due to abdominal pain and also noted to have acute hyperkalemia.  1. Abdominal pain- etiology unclear and suspected to be due to ascites but now resolved. Patient did have ascites and is status post ultrasound-guided paracentesis but the fluid analysis is not consistent with SBP.  - afebrile, hemodynamically stable and pain has resolved and tolerating PO well.   2. Acute hyperkalemia -  Due to ESRD and pt. Being non-compliant.  - pt. Had urgent HD while in the hospital and his Potassium level has normalized now.   3. End-stage renal disease on hemodialysis- he will continue dialysis on Monday Wednesday and Friday  4. Essential hypertension- he will continue metoprolol, Norvasc  5. Secondary hyperparathyroidism- he will continue PhosLo.  6. GERD-he will continue Protonix.  DISCHARGE CONDITIONS:   Stable.   CONSULTS OBTAINED:  Treatment Team:  Murlean Iba, MD  DRUG ALLERGIES:  No Known Allergies  DISCHARGE  MEDICATIONS:   Allergies as of 10/30/2016   No Known Allergies     Medication List    TAKE these medications   amLODipine 10 MG tablet Commonly known as:  NORVASC Take 1 tablet (10 mg total) by mouth daily.   calcium acetate 667 MG capsule Commonly known as:  PHOSLO Take 2 capsules (1,334 mg total) by mouth 3 (three) times daily with meals.   losartan 100 MG tablet Commonly known as:  COZAAR Take 1 tablet (100 mg total) by mouth daily.   metoprolol tartrate 25 MG tablet Commonly known as:  LOPRESSOR Take 1 tablet (25 mg total) by mouth 2 (two) times daily.   pantoprazole 40 MG tablet Commonly known as:  PROTONIX Take 1 tablet (40 mg total) by mouth daily.   traMADol 50 MG tablet Commonly known as:  ULTRAM Take 1 tablet (50 mg total) by mouth every 12 (twelve) hours as needed for moderate pain.         DISCHARGE INSTRUCTIONS:   DIET:  Cardiac diet and Renal diet  DISCHARGE CONDITION:  Stable  ACTIVITY:  Activity as tolerated  OXYGEN:  Home Oxygen: No.   Oxygen Delivery: room air  DISCHARGE LOCATION:  home   If you experience worsening of your admission symptoms, develop shortness of breath, life threatening emergency, suicidal or homicidal thoughts you must seek medical attention immediately by calling 911 or calling your MD immediately  if symptoms less severe.  You Must read complete instructions/literature along with all the possible adverse reactions/side effects for all the Medicines you take and that have been prescribed to you. Take any  new Medicines after you have completely understood and accpet all the possible adverse reactions/side effects.   Please note  You were cared for by a hospitalist during your hospital stay. If you have any questions about your discharge medications or the care you received while you were in the hospital after you are discharged, you can call the unit and asked to speak with the hospitalist on call if the hospitalist  that took care of you is not available. Once you are discharged, your primary care physician will handle any further medical issues. Please note that NO REFILLS for any discharge medications will be authorized once you are discharged, as it is imperative that you return to your primary care physician (or establish a relationship with a primary care physician if you do not have one) for your aftercare needs so that they can reassess your need for medications and monitor your lab values.     Today   Abdominal pain resolved. Potassium level stable.  To have to HD today.  No other acute events overnight.    VITAL SIGNS:  Blood pressure (!) 167/79, pulse 73, temperature 98 F (36.7 C), temperature source Oral, resp. rate 20, height 5\' 2"  (1.575 m), weight 81 kg (178 lb 9.2 oz), SpO2 100 %.  I/O:   Intake/Output Summary (Last 24 hours) at 10/30/16 1545 Last data filed at 10/30/16 1450  Gross per 24 hour  Intake              462 ml  Output             1050 ml  Net             -588 ml    PHYSICAL EXAMINATION:   GENERAL:  46 y.o.-year-old patient lying in the bed in no acute distress.  EYES: Pupils equal, round, reactive to light and accommodation. No scleral icterus. Extraocular muscles intact.  HEENT: Head atraumatic, normocephalic. Oropharynx and nasopharynx clear.  NECK:  Supple, no jugular venous distention. No thyroid enlargement, no tenderness.  LUNGS: Normal breath sounds bilaterally, no wheezing, rales, rhonchi. No use of accessory muscles of respiration.  CARDIOVASCULAR: S1, S2 normal. II/VI SEM at base. No rubs, or gallops.  ABDOMEN: Soft, Distended,non-tender, + fluid wave consistent w/ Ascites. Bowel sounds present. No organomegaly or mass.  EXTREMITIES: No cyanosis, clubbing or edema b/l.    NEUROLOGIC: Cranial nerves II through XII are intact. No focal Motor or sensory deficits b/l.   PSYCHIATRIC: The patient is alert and oriented x 3.  SKIN: No obvious rash, lesion, or  ulcer.   Right upper ext. AV fistula with good bruit, thrill.   DATA REVIEW:   CBC  Recent Labs Lab 10/29/16 0345  WBC 4.4  HGB 10.5*  HCT 31.9*  PLT 129*    Chemistries   Recent Labs Lab 10/28/16 1211  10/29/16 0345  NA 138  --  139  K 7.3*  < > 4.4  CL 100*  --  99*  CO2 22  --  29  GLUCOSE 106*  --  86  BUN 66*  --  42*  CREATININE 14.26*  --  10.54*  CALCIUM 9.3  --  8.7*  AST 26  --   --   ALT 17  --   --   ALKPHOS 185*  --   --   BILITOT 1.0  --   --   < > = values in this interval not displayed.  Cardiac Enzymes No results for  input(s): TROPONINI in the last 168 hours.  Microbiology Results  Results for orders placed or performed during the hospital encounter of 10/28/16  MRSA PCR Screening     Status: None   Collection Time: 10/28/16  5:20 PM  Result Value Ref Range Status   MRSA by PCR NEGATIVE NEGATIVE Final    Comment:        The GeneXpert MRSA Assay (FDA approved for NASAL specimens only), is one component of a comprehensive MRSA colonization surveillance program. It is not intended to diagnose MRSA infection nor to guide or monitor treatment for MRSA infections.   Body fluid culture     Status: None (Preliminary result)   Collection Time: 10/29/16  9:56 AM  Result Value Ref Range Status   Specimen Description PERITONEAL  Final   Special Requests NONE  Final   Gram Stain   Final    MODERATE WBC PRESENT, PREDOMINANTLY MONONUCLEAR NO ORGANISMS SEEN    Culture   Final    NO GROWTH < 24 HOURS Performed at [facility] [address], [place], [place] [address]    Report Status PENDING  Incomplete    RADIOLOGY:  US Paracentesis  Result Date: 10/29/2016 INDICATION: Ascites EXAM: ULTRASOUND GUIDED PARACENTESIS MEDICATIONS: None. COMPLICATIONS: None immediate. PROCEDURE: Informed written consent was obtained from the patient after a discussion of the risks, benefits and alternatives to treatment. A timeout was performed prior to  the initiation of the procedure. Initial ultrasound scanning demonstrates a large amount of ascites within the right abdomen. The right abdomen was prepped and draped in the usual sterile fashion. 1% lidocaine with epinephrine was used for local anesthesia. Following this, utilizing real-time ultrasound guidance a 6 Fr Safe-T-Centesis catheter was introduced. An ultrasound image was saved for documentation purposes. The paracentesis was performed. The catheter was removed and a dressing was applied. The patient tolerated the procedure well without immediate post procedural complication. FINDINGS: A total of approximately 1 L of clear yellow fluid was removed. Samples were sent to the laboratory as requested by the clinical team. IMPRESSION: Successful ultrasound-guided paracentesis yielding 1 liter of peritoneal fluid. The procedure was terminated at the patient's request prior to complete evacuation of the ascites ANESTHESIA/SEDATION: None Electronically Signed   By: Inez Catalina M.D.   On: 10/29/2016 10:50      Management plans discussed with the patient, family and they are in agreement.  CODE STATUS:     Code Status Orders        Start     Ordered   10/28/16 1611  Full code  Continuous     10/28/16 1611          TOTAL TIME TAKING CARE OF THIS PATIENT: 40 minutes.    Henreitta Leber M.D on 10/30/2016 at 3:45 PM  Between 7am to 6pm - Pager - [phone]  After 6pm go to [email or web address] - Proofreader  [facility]  Office  [phone]  CC: Primary care physician; [name], MD

## 2016-10-30 NOTE — Progress Notes (Signed)
Post hd vitals 

## 2016-10-30 NOTE — Progress Notes (Signed)
Start of hd 

## 2016-10-30 NOTE — Progress Notes (Signed)
  End of hd 

## 2016-10-30 NOTE — Progress Notes (Signed)
Post hd assessment 

## 2016-10-30 NOTE — Progress Notes (Signed)
Pre hd vitals 

## 2016-10-31 LAB — GLUCOSE, CAPILLARY: Glucose-Capillary: 77 mg/dL (ref 65–99)

## 2016-10-31 NOTE — Progress Notes (Signed)
Pt to be discharged per Md order. IV removed. Instructions reviewed with pt using interpreter. Pt verbalizes understanding and has no new medications. Pt to be wheeled down around 11 when his ride will be here.

## 2016-11-01 LAB — LIPASE, FLUID: Lipase-Fluid: 18 U/L

## 2016-11-02 LAB — BODY FLUID CULTURE: Culture: NO GROWTH

## 2016-11-25 ENCOUNTER — Emergency Department
Admission: EM | Admit: 2016-11-25 | Discharge: 2016-11-26 | Disposition: A | Discharge status: Home / Self Care | Payer: Self-pay | Attending: Emergency Medicine | Admitting: Emergency Medicine

## 2016-11-25 DIAGNOSIS — I5042 Chronic combined systolic (congestive) and diastolic (congestive) heart failure: Secondary | ICD-10-CM | POA: Insufficient documentation

## 2016-11-25 DIAGNOSIS — R109 Unspecified abdominal pain: Secondary | ICD-10-CM

## 2016-11-25 DIAGNOSIS — Z79899 Other long term (current) drug therapy: Secondary | ICD-10-CM | POA: Insufficient documentation

## 2016-11-25 DIAGNOSIS — Z992 Dependence on renal dialysis: Secondary | ICD-10-CM | POA: Insufficient documentation

## 2016-11-25 DIAGNOSIS — K7460 Unspecified cirrhosis of liver: Secondary | ICD-10-CM

## 2016-11-25 DIAGNOSIS — R188 Other ascites: Secondary | ICD-10-CM

## 2016-11-25 DIAGNOSIS — N186 End stage renal disease: Secondary | ICD-10-CM | POA: Insufficient documentation

## 2016-11-25 DIAGNOSIS — R0789 Other chest pain: Secondary | ICD-10-CM | POA: Insufficient documentation

## 2016-11-25 DIAGNOSIS — I132 Hypertensive heart and chronic kidney disease with heart failure and with stage 5 chronic kidney disease, or end stage renal disease: Secondary | ICD-10-CM | POA: Insufficient documentation

## 2016-11-25 DIAGNOSIS — R079 Chest pain, unspecified: Secondary | ICD-10-CM

## 2016-11-25 DIAGNOSIS — F17210 Nicotine dependence, cigarettes, uncomplicated: Secondary | ICD-10-CM | POA: Insufficient documentation

## 2016-11-25 DIAGNOSIS — K7031 Alcoholic cirrhosis of liver with ascites: Secondary | ICD-10-CM | POA: Insufficient documentation

## 2016-11-25 LAB — CBC
HEMATOCRIT: 35.3 % — AB (ref 40.0–52.0)
Hemoglobin: 12 g/dL — ABNORMAL LOW (ref 13.0–18.0)
MCH: 32.8 pg (ref 26.0–34.0)
MCHC: 34.1 g/dL (ref 32.0–36.0)
MCV: 96.2 fL (ref 80.0–100.0)
Platelets: 168 10*3/uL (ref 150–440)
RBC: 3.67 MIL/uL — ABNORMAL LOW (ref 4.40–5.90)
RDW: 16 % — AB (ref 11.5–14.5)
WBC: 5.5 10*3/uL (ref 3.8–10.6)

## 2016-11-25 MED ORDER — ONDANSETRON HCL 4 MG/2ML IJ SOLN
4.0000 mg | Freq: Once | INTRAMUSCULAR | Status: AC
  Administered 2016-11-25: 4 mg via INTRAVENOUS
  Filled 2016-11-25: qty 2

## 2016-11-25 MED ORDER — MORPHINE SULFATE (PF) 4 MG/ML IV SOLN
4.0000 mg | Freq: Once | INTRAVENOUS | Status: AC
  Administered 2016-11-25: 4 mg via INTRAVENOUS
  Filled 2016-11-25: qty 1

## 2016-11-25 NOTE — ED Notes (Signed)
Pt c/o right upper quadrant pain that got increasingly worse for the last couple of days. Pt also reports bloody stool.

## 2016-11-25 NOTE — ED Triage Notes (Signed)
Pt in with co RUQ pain for 3 days hx of cirrhosis and also having bloody stools. Pt has hx of kidney failure had dialysis today.

## 2016-11-26 ENCOUNTER — Emergency Department: Payer: Self-pay

## 2016-11-26 LAB — COMPREHENSIVE METABOLIC PANEL
ALBUMIN: 3.7 g/dL (ref 3.5–5.0)
ALT: 21 U/L (ref 17–63)
AST: 31 U/L (ref 15–41)
Alkaline Phosphatase: 194 U/L — ABNORMAL HIGH (ref 38–126)
Anion gap: 12 (ref 5–15)
BUN: 34 mg/dL — ABNORMAL HIGH (ref 6–20)
CALCIUM: 9.3 mg/dL (ref 8.9–10.3)
CO2: 25 mmol/L (ref 22–32)
Chloride: 98 mmol/L — ABNORMAL LOW (ref 101–111)
Creatinine, Ser: 7.88 mg/dL — ABNORMAL HIGH (ref 0.61–1.24)
GFR calc Af Amer: 8 mL/min — ABNORMAL LOW (ref >60)
GFR, EST NON AFRICAN AMERICAN: 7 mL/min — AB (ref >60)
Glucose, Bld: 104 mg/dL — ABNORMAL HIGH (ref 65–99)
Potassium: 5.4 mmol/L — ABNORMAL HIGH (ref 3.5–5.1)
Sodium: 135 mmol/L (ref 135–145)
Total Bilirubin: 1.2 mg/dL (ref 0.3–1.2)
Total Protein: 7.6 g/dL (ref 6.5–8.1)

## 2016-11-26 LAB — LIPASE, BLOOD: LIPASE: 40 U/L (ref 11–51)

## 2016-11-26 LAB — ETHANOL

## 2016-11-26 LAB — TROPONIN I

## 2016-11-26 LAB — FIBRIN DERIVATIVES D-DIMER (ARMC ONLY): Fibrin derivatives D-dimer (ARMC): 1766.96 — ABNORMAL HIGH (ref 0.00–499.00)

## 2016-11-26 MED ORDER — IOPAMIDOL (ISOVUE-370) INJECTION 76%
75.0000 mL | Freq: Once | INTRAVENOUS | Status: AC | PRN
  Administered 2016-11-26: 75 mL via INTRAVENOUS

## 2016-11-26 MED ORDER — TRAMADOL HCL 50 MG PO TABS: 50.0000 mg | ORAL_TABLET | Freq: Four times a day (QID) | ORAL | 0 refills | Status: DC | PRN

## 2016-11-26 MED ORDER — MORPHINE SULFATE (PF) 4 MG/ML IV SOLN
4.0000 mg | Freq: Once | INTRAVENOUS | Status: AC
  Administered 2016-11-26: 4 mg via INTRAVENOUS
  Filled 2016-11-26: qty 1

## 2016-11-26 MED ORDER — LIDOCAINE 5 % EX PTCH
1.0000 | MEDICATED_PATCH | CUTANEOUS | Status: DC
  Administered 2016-11-26: 1 via TRANSDERMAL
  Filled 2016-11-26: qty 1

## 2016-11-26 MED ORDER — HYDROMORPHONE HCL 1 MG/ML IJ SOLN
1.0000 mg | Freq: Once | INTRAMUSCULAR | Status: AC
  Administered 2016-11-26: 1 mg via INTRAVENOUS
  Filled 2016-11-26: qty 1

## 2016-11-26 NOTE — ED Provider Notes (Signed)
[facility] Emergency Department Provider Note   ____________________________________________   First MD Initiated Contact with Patient 11/25/16 2344     (approximate)  I have reviewed the triage vital signs and the nursing notes.   HISTORY  Chief Complaint Abdominal Pain    HPI [place] Pettengill is a 46 y.o. male who comes into the hospital today with some right-sided chest pain. He reports that he's had this pain for the past 3 days. He has not taken anything for pain. He reports that it pulsating kind of pain. He states it it's in his ribs and it feels deep. The patient is also feeling short of breath and reports the pain is worse when he takes a deep breath. The patient also reports that he's had blood in his stool for 3 days. He reports that was regular stool and it wasn't forced or constipated stool. The patient states his pain a 10 out of 10 in intensity. He states he's never had this before. He has been unable to sleep for the past 2 nights. The patient denies any cough or sweats. He has had some nausea with no vomiting. The patient had dialysis today and typically gets dialysis on Tuesdays, Thursdays, Saturdays. The patient is here today for evaluation.   Past Medical History:  Diagnosis Date  . Chronic combined systolic and diastolic CHF (congestive heart failure) ([place])   . Cirrhosis ([place])   . Dialysis patient ([place])   . Hypertension   . Renal disorder   . Renal insufficiency     Patient Active Problem List   Diagnosis Date Noted  . Acute hyperkalemia 10/28/2016  . Chronic combined systolic and diastolic CHF (congestive heart failure) ([place]) 10/13/2016  . Cirrhosis ([place]) 10/13/2016  . HTN (hypertension) 10/13/2016  . Abdominal pain 08/28/2016  . Pulmonary edema 07/03/2016  . Fluid overload 04/12/2016  . Chest pain 04/11/2016  . Scrotal infection 01/25/2015  . ESRD (end stage renal disease) on dialysis ([place]) 01/25/2015    Past Surgical  History:  Procedure Laterality Date  . AV FISTULA PLACEMENT      Prior to Admission medications   Medication Sig Start Date End Date Taking? Authorizing Provider  amLODipine (NORVASC) 10 MG tablet Take 1 tablet (10 mg total) by mouth daily. Patient not taking: Reported on 10/13/2016 08/29/16   Hillary Bow, MD  calcium acetate (PHOSLO) 667 MG capsule Take 2 capsules (1,334 mg total) by mouth 3 (three) times daily with meals. Patient not taking: Reported on 10/28/2016 10/14/16   Fritzi Mandes, MD  losartan (COZAAR) 100 MG tablet Take 1 tablet (100 mg total) by mouth daily. Patient not taking: Reported on 10/28/2016 10/14/16   Fritzi Mandes, MD  metoprolol tartrate (LOPRESSOR) 25 MG tablet Take 1 tablet (25 mg total) by mouth 2 (two) times daily. Patient not taking: Reported on 10/13/2016 04/16/16   Henreitta Leber, MD  pantoprazole (PROTONIX) 40 MG tablet Take 1 tablet (40 mg total) by mouth daily. Patient not taking: Reported on 10/28/2016 10/14/16   Fritzi Mandes, MD  traMADol (ULTRAM) 50 MG tablet Take 1 tablet (50 mg total) by mouth every 12 (twelve) hours as needed for moderate pain. Patient not taking: Reported on 10/13/2016 08/30/16   Gladstone Lighter, MD  traMADol (ULTRAM) 50 MG tablet Take 1 tablet (50 mg total) by mouth every 6 (six) hours as needed. 11/26/16   Loney Hering, MD    Allergies Patient has no known allergies.  Family History  Problem  Relation Age of Onset  . Kidney failure Father   . Diabetes Mother     Social History Social History  Substance Use Topics  . Smoking status: Current Every Day Smoker    Packs/day: 0.25    Types: Cigarettes  . Smokeless tobacco: Never Used  . Alcohol use No     Comment: 1/2 bottle tequilla per day    Review of Systems  Constitutional: No fever/chills Eyes: No visual changes. ENT: No sore throat. Cardiovascular:chest pain. Respiratory: shortness of breath. Gastrointestinal:  abdominal pain.   nausea, no vomiting.  No diarrhea.  No  constipation. Genitourinary: Negative for dysuria. Musculoskeletal: Negative for back pain. Skin: Negative for rash. Neurological: Negative for headaches, focal weakness or numbness.   ____________________________________________   PHYSICAL EXAM:  VITAL SIGNS: ED Triage Vitals  Enc Vitals Group     BP 11/25/16 2317 (!) 156/78     Pulse Rate 11/25/16 2317 66     Resp 11/25/16 2317 18     Temp 11/25/16 2317 98.1 F (36.7 C)     Temp src --      SpO2 11/25/16 2317 100 %     Weight 11/25/16 2315 154 lb 5.2 oz (70 kg)     Height --      Head Circumference --      Peak Flow --      Pain Score 11/25/16 2315 10     Pain Loc --      Pain Edu? --      Excl. in [place]? --     Constitutional: Alert and oriented. Disheveled appearing and in moderate distress. Eyes: Conjunctivae are normal. PERRL. EOMI. Head: Atraumatic. Nose: No congestion/rhinnorhea. Mouth/Throat: Mucous membranes are moist.  Oropharynx non-erythematous. Cardiovascular: Normal rate, regular rhythm. Grossly normal heart sounds.  Good peripheral circulation. Respiratory: Normal respiratory effort.  No retractions. Lungs CTAB. TTP of right chest wall Gastrointestinal: Soft with some RUQ and epigastric abd pain to palpation. No distention. Positive bowel sounds Musculoskeletal: No lower extremity tenderness nor edema.  Neurologic:  Normal speech and language.  Skin:  Skin is warm, dry and intact.  Psychiatric: Mood and affect are normal.   ____________________________________________   LABS (all labs ordered are listed, but only abnormal results are displayed)  Labs Reviewed  CBC - Abnormal; Notable for the following:       Result Value   RBC 3.67 (*)    Hemoglobin 12.0 (*)    HCT 35.3 (*)    RDW 16.0 (*)    All other components within normal limits  COMPREHENSIVE METABOLIC PANEL - Abnormal; Notable for the following:    Potassium 5.4 (*)    Chloride 98 (*)    Glucose, Bld 104 (*)    BUN 34 (*)     Creatinine, Ser 7.88 (*)    Alkaline Phosphatase 194 (*)    GFR calc non Af Amer 7 (*)    GFR calc Af Amer 8 (*)    All other components within normal limits  FIBRIN DERIVATIVES D-DIMER ([facility] ONLY) - Abnormal; Notable for the following:    Fibrin derivatives D-dimer [facility]) 0,630.16 (*)    All other components within normal limits  LIPASE, BLOOD  TROPONIN I  ETHANOL   ____________________________________________  EKG  ED ECG REPORT I, Loney Hering, the attending physician, personally viewed and interpreted this ECG.   Date: 11/25/2016  EKG Time: 2321  Rate: 67  Rhythm: normal sinus rhythm  Axis: normal  Intervals:none  ST&T Change: flipped t waves in lead III  ____________________________________________  RADIOLOGY  CXR [place] abd RUQ CT angio chest ____________________________________________   PROCEDURES  Procedure(s) performed: None  Procedures  Critical Care performed: No  ____________________________________________   INITIAL IMPRESSION / ASSESSMENT AND PLAN / ED COURSE  Pertinent labs & imaging results that were available during my care of the patient were reviewed by me and considered in my medical decision making (see chart for details).  This is a 46 year old male who comes into the hospital today with some right sided chest pain as well as upper abdomen pain. The patient does have a history of pancreatitis but his lipase is unremarkable. The patient had a CT scan in March of his abdomen which showed cirrhosis. I performed an ultrasound looking at the patient's gallbladder and he has some small amount of ascites and cirrhosis. I did perform a d-dimer that was elevated given the patient's pleuritic pain. I will send the patient for a CT angiogram of his chest looking for a possible PE. The patient did receive some morphine for his pain and will receive a second dose.  Clinical Course as of Nov 27 539  Wed Nov 26, 2016  0042 1. No acute abnormality  seen at the right upper quadrant. 2. Findings of hepatic cirrhosis again noted. 3. Small amount of surrounding ascites.   US Abdomen Limited RUQ [AW]  4742 No acute cardiopulmonary process.  Mild cardiomegaly.   DG Chest 2 View [AW]  0226 1. No acute intrathoracic pathology. No CT evidence of pulmonary embolism. 2. Chronic appearing left posterior pleural thickening with calcified plaque, likely sequela of prior infection. Multiple small scattered calcified granuloma and a 17mm left lower lobe pulmonary nodule. These nodules are stable compared to the prior CTs. No follow-up needed if patient is low-risk (and has no known or suspected primary neoplasm). Non-contrast chest CT can be considered in 12 months if patient is high-risk. This recommendation follows the consensus statement: Guidelines for Management of Incidental Pulmonary Nodules Detected on CT Images: From the Fleischner Society 2017; Radiology 2017; 284:228-243. 3. Decompensated cirrhosis with small ascites and large upper abdominal varices. 4. Atrophic native kidneys.   CT Angio Chest PE W and/or Wo Contrast [AW]    Clinical Course User Index [AW] Loney Hering, MD   The patient's imaging studies are unremarkable. I did give the patient a dose of Dilaudid for pain as well as he was sleeping when I did go in to check on him. He still rates his pain at 10 but I informed him that he needs to follow back up with his doctor. I feel that the patient's pain is due to his ascites as well as his cirrhosis. The patient also had a Lidoderm patch placed to his chest wall. I will discharge the patient to have him follow-up with his primary care physician. I did perform a rectal exam and the patient had brown stool that was heme-negative on testing.  ____________________________________________   FINAL CLINICAL IMPRESSION(S) / ED DIAGNOSES  Final diagnoses:  Abdominal pain  Chest pain, unspecified type  Ascites due to  alcoholic cirrhosis (H[facility])  Cirrhosis of liver with ascites, unspecified hepatic cirrhosis type ([place])      NEW MEDICATIONS STARTED DURING THIS VISIT:  New Prescriptions   TRAMADOL (ULTRAM) 50 MG TABLET    Take 1 tablet (50 mg total) by mouth every 6 (six) hours as needed.     Note:  This document was prepared using Dragon  voice recognition software and may include unintentional dictation errors.    Loney Hering, MD 11/26/16 [id number]

## 2016-11-26 NOTE — ED Notes (Signed)
Patient transported to X-ray 

## 2016-11-27 ENCOUNTER — Encounter: Payer: Self-pay | Admitting: Emergency Medicine

## 2016-11-27 ENCOUNTER — Inpatient Hospital Stay: Payer: Medicaid Other

## 2016-11-27 ENCOUNTER — Inpatient Hospital Stay
Admission: EM | Admit: 2016-11-27 | Discharge: 2016-11-30 | DRG: 640 | Disposition: A | Discharge status: Home / Self Care | Payer: Medicaid Other | Attending: Internal Medicine | Admitting: Internal Medicine

## 2016-11-27 DIAGNOSIS — M940 Chondrocostal junction syndrome [Tietze]: Secondary | ICD-10-CM | POA: Diagnosis present

## 2016-11-27 DIAGNOSIS — E875 Hyperkalemia: Secondary | ICD-10-CM | POA: Diagnosis present

## 2016-11-27 DIAGNOSIS — K7031 Alcoholic cirrhosis of liver with ascites: Secondary | ICD-10-CM | POA: Diagnosis present

## 2016-11-27 DIAGNOSIS — Z992 Dependence on renal dialysis: Secondary | ICD-10-CM | POA: Diagnosis not present

## 2016-11-27 DIAGNOSIS — K219 Gastro-esophageal reflux disease without esophagitis: Secondary | ICD-10-CM | POA: Diagnosis present

## 2016-11-27 DIAGNOSIS — Z79899 Other long term (current) drug therapy: Secondary | ICD-10-CM

## 2016-11-27 DIAGNOSIS — D631 Anemia in chronic kidney disease: Secondary | ICD-10-CM | POA: Diagnosis present

## 2016-11-27 DIAGNOSIS — R1011 Right upper quadrant pain: Secondary | ICD-10-CM

## 2016-11-27 DIAGNOSIS — N2581 Secondary hyperparathyroidism of renal origin: Secondary | ICD-10-CM | POA: Diagnosis present

## 2016-11-27 DIAGNOSIS — I5042 Chronic combined systolic (congestive) and diastolic (congestive) heart failure: Secondary | ICD-10-CM | POA: Diagnosis present

## 2016-11-27 DIAGNOSIS — F17210 Nicotine dependence, cigarettes, uncomplicated: Secondary | ICD-10-CM | POA: Diagnosis present

## 2016-11-27 DIAGNOSIS — Z841 Family history of disorders of kidney and ureter: Secondary | ICD-10-CM

## 2016-11-27 DIAGNOSIS — R1010 Upper abdominal pain, unspecified: Secondary | ICD-10-CM

## 2016-11-27 DIAGNOSIS — Z833 Family history of diabetes mellitus: Secondary | ICD-10-CM

## 2016-11-27 DIAGNOSIS — I132 Hypertensive heart and chronic kidney disease with heart failure and with stage 5 chronic kidney disease, or end stage renal disease: Secondary | ICD-10-CM | POA: Diagnosis present

## 2016-11-27 DIAGNOSIS — N186 End stage renal disease: Secondary | ICD-10-CM

## 2016-11-27 DIAGNOSIS — I8500 Esophageal varices without bleeding: Secondary | ICD-10-CM | POA: Diagnosis present

## 2016-11-27 DIAGNOSIS — R112 Nausea with vomiting, unspecified: Secondary | ICD-10-CM

## 2016-11-27 DIAGNOSIS — K652 Spontaneous bacterial peritonitis: Secondary | ICD-10-CM

## 2016-11-27 DIAGNOSIS — E785 Hyperlipidemia, unspecified: Secondary | ICD-10-CM | POA: Diagnosis present

## 2016-11-27 HISTORY — DX: Hyperkalemia: E87.5

## 2016-11-27 LAB — COMPREHENSIVE METABOLIC PANEL
ALBUMIN: 3.8 g/dL (ref 3.5–5.0)
ALK PHOS: 179 U/L — AB (ref 38–126)
ALT: 17 U/L (ref 17–63)
AST: 22 U/L (ref 15–41)
Anion gap: 14 (ref 5–15)
BILIRUBIN TOTAL: 1.1 mg/dL (ref 0.3–1.2)
BUN: 49 mg/dL — ABNORMAL HIGH (ref 6–20)
CALCIUM: 8.8 mg/dL — AB (ref 8.9–10.3)
CO2: 23 mmol/L (ref 22–32)
CREATININE: 10.21 mg/dL — AB (ref 0.61–1.24)
Chloride: 93 mmol/L — ABNORMAL LOW (ref 101–111)
GFR calc Af Amer: 6 mL/min — ABNORMAL LOW (ref >60)
GFR calc non Af Amer: 5 mL/min — ABNORMAL LOW (ref >60)
GLUCOSE: 88 mg/dL (ref 65–99)
Potassium: 6.7 mmol/L (ref 3.5–5.1)
Sodium: 130 mmol/L — ABNORMAL LOW (ref 135–145)
TOTAL PROTEIN: 7.9 g/dL (ref 6.5–8.1)

## 2016-11-27 LAB — CBC
HCT: 36.7 % — ABNORMAL LOW (ref 40.0–52.0)
Hemoglobin: 12.3 g/dL — ABNORMAL LOW (ref 13.0–18.0)
MCH: 32.7 pg (ref 26.0–34.0)
MCHC: 33.4 g/dL (ref 32.0–36.0)
MCV: 97.7 fL (ref 80.0–100.0)
Platelets: 145 10*3/uL — ABNORMAL LOW (ref 150–440)
RBC: 3.76 MIL/uL — ABNORMAL LOW (ref 4.40–5.90)
RDW: 15.5 % — ABNORMAL HIGH (ref 11.5–14.5)
WBC: 6 10*3/uL (ref 3.8–10.6)

## 2016-11-27 LAB — BODY FLUID CELL COUNT WITH DIFFERENTIAL
Eos, Fluid: 2 %
Lymphs, Fluid: 0 %
Monocyte-Macrophage-Serous Fluid: 96 %
NEUTROPHIL FLUID: 2 %
Other Cells, Fluid: 0 %
Total Nucleated Cell Count, Fluid: 1500 cu mm

## 2016-11-27 LAB — POTASSIUM: Potassium: 5.8 mmol/L — ABNORMAL HIGH (ref 3.5–5.1)

## 2016-11-27 LAB — LIPASE, BLOOD: Lipase: 40 U/L (ref 11–51)

## 2016-11-27 LAB — PHOSPHORUS: Phosphorus: 9.9 mg/dL — ABNORMAL HIGH (ref 2.5–4.6)

## 2016-11-27 MED ORDER — ONDANSETRON HCL 4 MG/2ML IJ SOLN
4.0000 mg | Freq: Once | INTRAMUSCULAR | Status: AC
  Administered 2016-11-27: 4 mg via INTRAVENOUS
  Filled 2016-11-27: qty 2

## 2016-11-27 MED ORDER — LOSARTAN POTASSIUM 50 MG PO TABS
100.0000 mg | ORAL_TABLET | Freq: Every day | ORAL | Status: DC
  Administered 2016-11-28 – 2016-11-30 (×2): 100 mg via ORAL
  Filled 2016-11-27 (×2): qty 2

## 2016-11-27 MED ORDER — ALTEPLASE 2 MG IJ SOLR: 2.0000 mg | Freq: Once | INTRAMUSCULAR | Status: DC | PRN

## 2016-11-27 MED ORDER — SODIUM CHLORIDE 0.9 % IV SOLN: 100.0000 mL | INTRAVENOUS | Status: DC | PRN

## 2016-11-27 MED ORDER — MORPHINE SULFATE (PF) 2 MG/ML IV SOLN
2.0000 mg | Freq: Once | INTRAVENOUS | Status: AC
  Administered 2016-11-27: 2 mg via INTRAVENOUS
  Filled 2016-11-27: qty 1

## 2016-11-27 MED ORDER — CALCIUM CHLORIDE 10 % IV SOLN
1.0000 g | Freq: Once | INTRAVENOUS | Status: AC
  Administered 2016-11-27: 1 g via INTRAVENOUS
  Filled 2016-11-27: qty 10

## 2016-11-27 MED ORDER — LIDOCAINE-PRILOCAINE 2.5-2.5 % EX CREA
1.0000 "application " | TOPICAL_CREAM | CUTANEOUS | Status: DC | PRN
  Filled 2016-11-27: qty 5

## 2016-11-27 MED ORDER — PENTAFLUOROPROP-TETRAFLUOROETH EX AERO
1.0000 "application " | INHALATION_SPRAY | CUTANEOUS | Status: DC | PRN
  Filled 2016-11-27: qty 30

## 2016-11-27 MED ORDER — ONDANSETRON HCL 4 MG/2ML IJ SOLN: 4.0000 mg | Freq: Four times a day (QID) | INTRAMUSCULAR | Status: DC | PRN

## 2016-11-27 MED ORDER — ONDANSETRON HCL 4 MG PO TABS: 4.0000 mg | ORAL_TABLET | Freq: Four times a day (QID) | ORAL | Status: DC | PRN

## 2016-11-27 MED ORDER — INSULIN ASPART 100 UNIT/ML ~~LOC~~ SOLN
10.0000 [IU] | Freq: Once | SUBCUTANEOUS | Status: AC
  Administered 2016-11-27: 10 [IU] via INTRAVENOUS
  Filled 2016-11-27: qty 10

## 2016-11-27 MED ORDER — AMLODIPINE BESYLATE 10 MG PO TABS
10.0000 mg | ORAL_TABLET | Freq: Every day | ORAL | Status: DC
  Administered 2016-11-28 – 2016-11-30 (×2): 10 mg via ORAL
  Filled 2016-11-27 (×3): qty 1

## 2016-11-27 MED ORDER — HYDROMORPHONE HCL 1 MG/ML IJ SOLN
1.0000 mg | Freq: Once | INTRAMUSCULAR | Status: AC
  Administered 2016-11-27: 1 mg via INTRAVENOUS
  Filled 2016-11-27: qty 1

## 2016-11-27 MED ORDER — DIPHENHYDRAMINE HCL 25 MG PO CAPS
25.0000 mg | ORAL_CAPSULE | Freq: Four times a day (QID) | ORAL | Status: DC | PRN
  Administered 2016-11-27: 25 mg via ORAL
  Filled 2016-11-27: qty 1

## 2016-11-27 MED ORDER — PANTOPRAZOLE SODIUM 40 MG PO TBEC
40.0000 mg | DELAYED_RELEASE_TABLET | Freq: Every day | ORAL | Status: DC
  Administered 2016-11-28 – 2016-11-30 (×2): 40 mg via ORAL
  Filled 2016-11-27 (×2): qty 1

## 2016-11-27 MED ORDER — METOPROLOL TARTRATE 25 MG PO TABS
25.0000 mg | ORAL_TABLET | Freq: Two times a day (BID) | ORAL | Status: DC
Start: 2016-11-27 — End: 2016-11-30
  Administered 2016-11-27 – 2016-11-30 (×4): 25 mg via ORAL
  Filled 2016-11-27 (×5): qty 1

## 2016-11-27 MED ORDER — CALCIUM ACETATE (PHOS BINDER) 667 MG PO CAPS
1334.0000 mg | ORAL_CAPSULE | Freq: Three times a day (TID) | ORAL | Status: DC
  Administered 2016-11-27 – 2016-11-30 (×6): 1334 mg via ORAL
  Filled 2016-11-27 (×7): qty 2

## 2016-11-27 MED ORDER — ACETAMINOPHEN 650 MG RE SUPP: 650.0000 mg | Freq: Four times a day (QID) | RECTAL | Status: DC | PRN

## 2016-11-27 MED ORDER — SODIUM BICARBONATE 8.4 % IV SOLN
50.0000 meq | Freq: Once | INTRAVENOUS | Status: AC
  Administered 2016-11-27: 50 meq via INTRAVENOUS
  Filled 2016-11-27: qty 50

## 2016-11-27 MED ORDER — CEFTRIAXONE SODIUM-DEXTROSE 1-3.74 GM-% IV SOLR: 1.0000 g | Freq: Once | INTRAVENOUS | Status: DC

## 2016-11-27 MED ORDER — SODIUM CHLORIDE 0.9% FLUSH
3.0000 mL | Freq: Two times a day (BID) | INTRAVENOUS | Status: DC
  Administered 2016-11-27 – 2016-11-30 (×6): 3 mL via INTRAVENOUS

## 2016-11-27 MED ORDER — SODIUM BICARBONATE 8.4 % IV SOLN
INTRAVENOUS | Status: AC
  Filled 2016-11-27: qty 50

## 2016-11-27 MED ORDER — DEXTROSE 50 % IV SOLN
25.0000 g | Freq: Once | INTRAVENOUS | Status: AC
  Administered 2016-11-27: 25 g via INTRAVENOUS
  Filled 2016-11-27: qty 50

## 2016-11-27 MED ORDER — HEPARIN SODIUM (PORCINE) 5000 UNIT/ML IJ SOLN
5000.0000 [IU] | Freq: Three times a day (TID) | INTRAMUSCULAR | Status: DC
  Administered 2016-11-27 – 2016-11-29 (×4): 5000 [IU] via SUBCUTANEOUS
  Filled 2016-11-27 (×5): qty 1

## 2016-11-27 MED ORDER — HEPARIN SODIUM (PORCINE) 1000 UNIT/ML DIALYSIS
1000.0000 [IU] | INTRAMUSCULAR | Status: DC | PRN
  Filled 2016-11-27: qty 1

## 2016-11-27 MED ORDER — MORPHINE SULFATE (PF) 2 MG/ML IV SOLN
2.0000 mg | INTRAVENOUS | Status: DC | PRN
  Administered 2016-11-27 – 2016-11-29 (×8): 2 mg via INTRAVENOUS
  Filled 2016-11-27 (×16): qty 1

## 2016-11-27 MED ORDER — LIDOCAINE HCL (PF) 1 % IJ SOLN
5.0000 mL | INTRAMUSCULAR | Status: DC | PRN
  Filled 2016-11-27: qty 5

## 2016-11-27 MED ORDER — FERRIC CITRATE 1 GM 210 MG(FE) PO TABS
420.0000 mg | ORAL_TABLET | Freq: Three times a day (TID) | ORAL | Status: DC
  Administered 2016-11-28 – 2016-11-30 (×4): 420 mg via ORAL
  Filled 2016-11-27 (×4): qty 2

## 2016-11-27 MED ORDER — DEXTROSE 5 % IV SOLN
1.0000 g | INTRAVENOUS | Status: DC
  Administered 2016-11-28: 1 g via INTRAVENOUS
  Filled 2016-11-27 (×4): qty 10

## 2016-11-27 MED ORDER — ACETAMINOPHEN 325 MG PO TABS: 650.0000 mg | ORAL_TABLET | Freq: Four times a day (QID) | ORAL | Status: DC | PRN

## 2016-11-27 NOTE — ED Notes (Signed)
Interpretor requested

## 2016-11-27 NOTE — ED Triage Notes (Addendum)
Pt to ed with c/o RUQ pain x several days.  Pt states was seen here on Tuesday for same, reports n/v this am.  Pt is on dialysis and had last dialysis on Tuesday, due today at 145 but states he can not go due to pain.

## 2016-11-27 NOTE — Care Management (Signed)
Chronic HD patient [name].  Spoke with Elvera Bicker with [facility] and informed patient "Mario [name] misses his Saturday dialysis."  Discussed on previous occasions CM discuss transportation issues with his dialysis clinic and informed that there are no services available to the patient due to undocumented status. Discussed emergency medicaid and that it is most likely only covers patient's dialysis.

## 2016-11-27 NOTE — ED Notes (Signed)
Pt stated to interpreter he doesn't make urine anymore

## 2016-11-27 NOTE — ED Provider Notes (Signed)
[facility] Emergency Department Provider Note       Time seen: ----------------------------------------- 10:02 AM on 11/27/2016 -----------------------------------------     I have reviewed the triage vital signs and the nursing notes.   HISTORY   Chief Complaint Abdominal Pain    HPI Mario [name] is a 46 y.o. male who presents to the ED for right upper quadrant pain for several days. Patient was seen here on Tuesday for same. Reports nausea and vomiting this morning. Patient is on dialysis and last had dialysis on Tuesday. Nothing makes his symptoms better or worse. Patient was due for dialysis today but could not go due to the pain.   Past Medical History:  Diagnosis Date  . Chronic combined systolic and diastolic CHF (congestive heart failure) ([place])   . Cirrhosis ([place])   . Dialysis patient ([place])   . Hypertension   . Renal disorder   . Renal insufficiency     Patient Active Problem List   Diagnosis Date Noted  . Acute hyperkalemia 10/28/2016  . Chronic combined systolic and diastolic CHF (congestive heart failure) ([place]) 10/13/2016  . Cirrhosis ([place]) 10/13/2016  . HTN (hypertension) 10/13/2016  . Abdominal pain 08/28/2016  . Pulmonary edema 07/03/2016  . Fluid overload 04/12/2016  . Chest pain 04/11/2016  . Scrotal infection 01/25/2015  . ESRD (end stage renal disease) on dialysis ([place]) 01/25/2015    Past Surgical History:  Procedure Laterality Date  . AV FISTULA PLACEMENT      Allergies Patient has no known allergies.  Social History Social History  Substance Use Topics  . Smoking status: Current Every Day Smoker    Packs/day: 0.25    Types: Cigarettes  . Smokeless tobacco: Never Used  . Alcohol use No     Comment: 1/2 bottle tequilla per day    Review of Systems Constitutional: Negative for fever. Eyes: Negative for vision changes ENT:  Negative for congestion, sore throat Cardiovascular: Negative for chest  pain. Respiratory: Negative for shortness of breath. Gastrointestinal: Positive for abdominal pain, vomiting Genitourinary: Negative for dysuria. Musculoskeletal: Negative for back pain. Skin: Negative for rash. Neurological: Negative for headaches, positive for generalized weakness  All systems negative/normal/unremarkable except as stated in the HPI  ____________________________________________   PHYSICAL EXAM:  VITAL SIGNS: ED Triage Vitals  Enc Vitals Group     BP 11/27/16 0928 140/75     Pulse Rate 11/27/16 0928 61     Resp 11/27/16 0928 20     Temp 11/27/16 0928 97.6 F (36.4 C)     Temp Source 11/27/16 0928 Oral     SpO2 11/27/16 0928 100 %     Weight --      Height --      Head Circumference --      Peak Flow --      Pain Score 11/27/16 0932 10     Pain Loc --      Pain Edu? --      Excl. in [place]? --     Constitutional: Alert and oriented. Mild to moderate distress Eyes: Conjunctivae are normal. PERRL. Normal extraocular movements. ENT   Head: Normocephalic and atraumatic.   Nose: No congestion/rhinnorhea.   Mouth/Throat: Mucous membranes are moist.   Neck: No stridor. Cardiovascular: Normal rate, regular rhythm. No murmurs, rubs, or gallops. Left upper arm AV fistula with thrill and bruit Respiratory: Normal respiratory effort without tachypnea nor retractions. Breath sounds are clear and equal bilaterally. No wheezes/rales/rhonchi. Gastrointestinal: Right-sided abdominal tenderness,  no rebound or guarding. Normal bowel sounds. Musculoskeletal: Nontender with normal range of motion in extremities. No lower extremity tenderness nor edema. Neurologic:  Normal speech and language. No gross focal neurologic deficits are appreciated.  Skin:  Skin is warm, dry and intact. No rash noted. Psychiatric: Mood and affect are normal. Speech and behavior are normal.  ____________________________________________  EKG: Interpreted by me.Sinus rhythm rate of 67  bpm, normal PR interval, no wide QRS, normal QT, hyperacute T waves are noted  ____________________________________________  ED COURSE:  Pertinent labs & imaging results that were available during my care of the patient were reviewed by me and considered in my medical decision making (see chart for details). Patient presents for abdominal pain and vomiting, we will assess with labs and imaging as indicated.   Procedures ____________________________________________   LABS (pertinent positives/negatives)  Labs Reviewed  COMPREHENSIVE METABOLIC PANEL - Abnormal; Notable for the following:       Result Value   Sodium 130 (*)    Potassium 6.7 (*)    Chloride 93 (*)    BUN 49 (*)    Creatinine, Ser 10.21 (*)    Calcium 8.8 (*)    Alkaline Phosphatase 179 (*)    GFR calc non Af Amer 5 (*)    GFR calc Af Amer 6 (*)    All other components within normal limits  CBC - Abnormal; Notable for the following:    RBC 3.76 (*)    Hemoglobin 12.3 (*)    HCT 36.7 (*)    RDW 15.5 (*)    Platelets 145 (*)    All other components within normal limits  LIPASE, BLOOD  URINALYSIS, COMPLETE (UACMP) WITH MICROSCOPIC   CRITICAL CARE Performed by: Earleen Newport   Total critical care time: 30 minutes  Critical care time was exclusive of separately billable procedures and treating other patients.  Critical care was necessary to treat or prevent imminent or life-threatening deterioration.  Critical care was time spent personally by me on the following activities: development of treatment plan with patient and/or surrogate as well as nursing, discussions with consultants, evaluation of patient's response to treatment, examination of patient, obtaining history from patient or surrogate, ordering and performing treatments and interventions, ordering and review of laboratory studies, ordering and review of radiographic studies, pulse oximetry and re-evaluation of patient's  condition. ____________________________________________  FINAL ASSESSMENT AND PLAN  Abdominal pain, vomiting, End-stage renal disease, hyperkalemia  Plan: Patient's labs and imaging were dictated above. Patient had presented for Abdominal pain which is acute on chronic. This may be chronic pancreatitis or alcohol related. At this point he has hyperkalemia due to missing dialysis and will need to be dialyzed and he does indeed have EKG changes consistent with same. We have given him insulin, D50, calcium and sodium bicarbonate. I discussed with nephrology who will arrange for dialysis.   Earleen Newport, MD   Note: This note was generated in part or whole with voice recognition software. Voice recognition is usually quite accurate but there are transcription errors that can and very often do occur. I apologize for any typographical errors that were not detected and corrected.     Earleen Newport, MD 11/27/16 [phone]

## 2016-11-27 NOTE — Procedures (Signed)
Ultrasound-guided diagnostic and therapeutic paracentesis performed yielding 900 cc of slightly hazy, amber colored fluid. No immediate complications. The fluid was sent to the lab for preordered studies.

## 2016-11-27 NOTE — H&P (Signed)
[facility] [facility] [place] NAME: Mario [name]    MR#:  [id number]  DATE OF BIRTH:  31-Jul-1971  DATE OF ADMISSION:  11/27/2016  PRIMARY CARE PHYSICIAN: [name], MD   REQUESTING/REFERRING PHYSICIAN: Dr. Lenise Arena.  CHIEF COMPLAINT:   Chief Complaint  Patient presents with  . Abdominal Pain    HISTORY OF PRESENT ILLNESS:  Mario [name]  is a 46 y.o. male with a known history of Chronic combined systolic and diastolic CHF, end-stage renal disease and hemodialysis, history of liver cirrhosis, hypertension, secondary hyperparathyroidism who presents to the hospital due to right upper quadrant abdominal pain which is also has a pleuritic component to it is a with some nausea but no vomiting. Patient also says that he's been having these symptoms for the past few days progressively getting worse. He denies any fever, no chills, no chest pain,, admits to some mild shortness of breath which is chronic for the patient. He presented to the emergency room from hemodialysis and was noted to be hyperkalemic with a potassium of 6.7. He also has some peaked T waves on EKG and therefore being admitted to the hospital for hemodialysis.  PAST MEDICAL HISTORY:   Past Medical History:  Diagnosis Date  . Chronic combined systolic and diastolic CHF (congestive heart failure) ([place])   . Cirrhosis ([place])   . Dialysis patient ([place])   . Hypertension   . Renal disorder   . Renal insufficiency     PAST SURGICAL HISTORY:   Past Surgical History:  Procedure Laterality Date  . AV FISTULA PLACEMENT      SOCIAL HISTORY:   Social History  Substance Use Topics  . Smoking status: Current Every Day Smoker    Packs/day: 0.25    Years: 25.00    Types: Cigarettes  . Smokeless tobacco: Never Used  . Alcohol use No     Comment: 1/2 bottle tequilla per day    FAMILY HISTORY:   Family History  Problem Relation Age of Onset  . Kidney failure  Father   . Diabetes Mother     DRUG ALLERGIES:  No Known Allergies  REVIEW OF SYSTEMS:   Review of Systems  Constitutional: Negative for fever and weight loss.  HENT: Negative for congestion, nosebleeds and tinnitus.   Eyes: Negative for blurred vision, double vision and redness.  Respiratory: Negative for cough, hemoptysis and shortness of breath.   Cardiovascular: Negative for chest pain, orthopnea, leg swelling and PND.  Gastrointestinal: Positive for abdominal pain. Negative for diarrhea, melena, nausea and vomiting.  Genitourinary: Negative for dysuria, hematuria and urgency.  Musculoskeletal: Negative for falls and joint pain.  Neurological: Negative for dizziness, tingling, sensory change, focal weakness, seizures, weakness and headaches.  Endo/Heme/Allergies: Negative for polydipsia. Does not bruise/bleed easily.  Psychiatric/Behavioral: Negative for depression and memory loss. The patient is not nervous/anxious.     MEDICATIONS AT HOME:   Prior to Admission medications   Medication Sig Start Date End Date Taking? Authorizing Provider  amLODipine (NORVASC) 10 MG tablet Take 1 tablet (10 mg total) by mouth daily. 08/29/16  Yes Srikar Sudini, MD  calcium acetate (PHOSLO) 667 MG tablet Take 1,334 mg by mouth 3 (three) times daily with meals.   Yes Historical Provider, MD  ferric citrate (AURYXIA) 1 GM 210 MG(Fe) tablet Take 420 mg by mouth 3 (three) times daily with meals.   Yes Historical Provider, MD  losartan (COZAAR) 100 MG tablet Take 1 tablet (100  mg total) by mouth daily. 10/14/16  Yes Fritzi Mandes, MD  metoprolol tartrate (LOPRESSOR) 25 MG tablet Take 1 tablet (25 mg total) by mouth 2 (two) times daily. 04/16/16  Yes Henreitta Leber, MD  pantoprazole (PROTONIX) 40 MG tablet Take 1 tablet (40 mg total) by mouth daily. 10/14/16  Yes Fritzi Mandes, MD  traMADol (ULTRAM) 50 MG tablet Take 1 tablet (50 mg total) by mouth every 12 (twelve) hours as needed for moderate pain. Patient  not taking: Reported on 10/13/2016 08/30/16   Gladstone Lighter, MD      VITAL SIGNS:  Blood pressure (!) 148/72, pulse 61, temperature 97.6 F (36.4 C), temperature source Oral, resp. rate 16, SpO2 100 %.  PHYSICAL EXAMINATION:  Physical Exam  GENERAL:  46 y.o.-year-old patient lying in the bed in no acute distress.  EYES: Pupils equal, round, reactive to light and accommodation. No scleral icterus. Extraocular muscles intact.  HEENT: Head atraumatic, normocephalic. Oropharynx and nasopharynx clear. No oropharyngeal erythema, moist oral mucosa  NECK:  Supple, no jugular venous distention. No thyroid enlargement, no tenderness.  LUNGS: Normal breath sounds bilaterally, no wheezing, rales, rhonchi. No use of accessory muscles of respiration.  CARDIOVASCULAR: S1, S2 RRR. II/VI SEM at base, No rubs, gallops, clicks.  ABDOMEN: Soft, nontender, nondistended. Bowel sounds present. No organomegaly or mass.  EXTREMITIES: No pedal edema, cyanosis, or clubbing. + 2 pedal & radial pulses b/l.   NEUROLOGIC: Cranial nerves II through XII are intact. No focal Motor or sensory deficits appreciated b/l PSYCHIATRIC: The patient is alert and oriented x 3. Good affect.  SKIN: No obvious rash, lesion, or ulcer.   Left Upper extremity AV fistula with good bruit and good thrill.  LABORATORY PANEL:   CBC  Recent Labs Lab 11/27/16 0944  WBC 6.0  HGB 12.3*  HCT 36.7*  PLT 145*   ------------------------------------------------------------------------------------------------------------------  Chemistries   Recent Labs Lab 11/27/16 0944  NA 130*  K 6.7*  CL 93*  CO2 23  GLUCOSE 88  BUN 49*  CREATININE 10.21*  CALCIUM 8.8*  AST 22  ALT 17  ALKPHOS 179*  BILITOT 1.1   ------------------------------------------------------------------------------------------------------------------  Cardiac Enzymes  Recent Labs Lab 11/25/16 2337  TROPONINI <0.03    ------------------------------------------------------------------------------------------------------------------  RADIOLOGY:  Dg Chest 2 View  Result Date: 11/26/2016 CLINICAL DATA:  46 year old male with right-sided chest and rib pain. EXAM: CHEST  2 VIEW COMPARISON:  Chest radiograph dated 10/28/2016 and 10/13/2016 FINDINGS: The lungs are clear. There is no pleural effusion or pneumothorax. Mild cardiomegaly. No acute osseous pathology identified. IMPRESSION: No acute cardiopulmonary process. Mild cardiomegaly. Electronically Signed   By: Anner Crete M.D.   On: 11/26/2016 00:25   Ct Angio Chest Pe W And/or Wo Contrast  Result Date: 11/26/2016 CLINICAL DATA:  46 year old male with right upper quadrant pain. History of cirrhosis. Patient is having bloody stools. Elevated D-dimer. EXAM: CT ANGIOGRAPHY CHEST WITH CONTRAST TECHNIQUE: Multidetector CT imaging of the chest was performed using the standard protocol during bolus administration of intravenous contrast. Multiplanar CT image reconstructions and MIPs were obtained to evaluate the vascular anatomy. CONTRAST:  75 cc Isovue 370 COMPARISON:  Chest radiograph dated 11/25/2016 FINDINGS: Cardiovascular: There is no cardiomegaly or pericardial effusion. The thoracic aorta appears unremarkable. The origins of the great vessels of the aortic arch appear patent. There is no CT evidence of pulmonary embolism. Mediastinum/Nodes: No hilar or mediastinal adenopathy. The esophagus and the thyroid gland are grossly unremarkable. Large paraesophageal varices as well as paraspinal collaterals  noted. Lungs/Pleura: Stable appearing left posterior pleural thickening with areas of calcification. A small pleural effusion is less likely. A 14 x 11 mm left lung base subpleural nodular density appears similar to prior CT and most likely represent an area scarring. There is a 50mm left lower lobe pulmonary nodule. Small calcified subpleural granuloma noted in the  lingula. There is a 3 mm left upper lobe subpleural nodule versus a granuloma. A 3 mm right upper lobe nodule and a 3 mm calcified right lower lobe granuloma. There is no pneumothorax. The central airways are patent. Upper Abdomen: There is morphologic changes of cirrhosis. The spleen is partially visualized and appears somewhat enlarged. There is a small ascites. Large upper abdominal varices noted. The kidneys are atrophic. Anterior abdominal wall varices are seen. Musculoskeletal: No chest wall abnormality. No acute or significant osseous findings. Review of the MIP images confirms the above findings. IMPRESSION: 1. No acute intrathoracic pathology. No CT evidence of pulmonary embolism. 2. Chronic appearing left posterior pleural thickening with calcified plaque, likely sequela of prior infection. Multiple small scattered calcified granuloma and a 73mm left lower lobe pulmonary nodule. These nodules are stable compared to the prior CTs. No follow-up needed if patient is low-risk (and has no known or suspected primary neoplasm). Non-contrast chest CT can be considered in 12 months if patient is high-risk. This recommendation follows the consensus statement: Guidelines for Management of Incidental Pulmonary Nodules Detected on CT Images: From the Fleischner Society 2017; Radiology 2017; 284:228-243. 3. Decompensated cirrhosis with small ascites and large upper abdominal varices. 4. Atrophic native kidneys. Electronically Signed   By: Anner Crete M.D.   On: 11/26/2016 02:17   US Abdomen Limited Ruq  Result Date: 11/26/2016 CLINICAL DATA:  Acute onset of right upper quadrant abdominal pain. Initial encounter. EXAM: US ABDOMEN LIMITED - RIGHT UPPER QUADRANT COMPARISON:  CT of the abdomen and pelvis from 10/13/2016, and right upper quadrant ultrasound performed 08/28/2016 FINDINGS: Gallbladder: No gallstones or wall thickening visualized. No sonographic Murphy sign noted by sonographer. Common bile duct:  Diameter: 0.4 cm, within normal limits in caliber. Liver: No focal lesion seen. The liver demonstrates heterogeneous echogenicity and a nodular contour, compatible with hepatic cirrhosis. A small amount of surrounding ascites is noted. The known atrophic right kidney and associated right renal cysts are partially characterized. IMPRESSION: 1. No acute abnormality seen at the right upper quadrant. 2. Findings of hepatic cirrhosis again noted. 3. Small amount of surrounding ascites. Electronically Signed   By: Garald Balding M.D.   On: 11/26/2016 00:38     IMPRESSION AND PLAN:   46 year old Hispanic male with past medical history of end-stage renal disease on hemodialysis, hypertension, hyperlipidemia, secondary parathyroidism, history of liver cirrhosis who presents to the hospital due to right upper quadrant abdominal pain which is pleuritic in nature and also noted to be hyperkalemic  1. Acute hyperkalemia-secondary to end-stage renal disease and hemodialysis. Patient also has EKG changes consistent with hyperkalemia. -Discussed with nephrology and plan for emergent hemodialysis today. Patient has been given some insulin and calcium gluconate here in the ER. -Place on telemetry, follow potassium level.  2. Abdominal pain-patient's pain is in the right lower quadrant and also has a pleuritic component to it. He CT in June of the chest does not show any evidence of acute pathology. He had a CT abdomen pelvis on his previous hospitalization which showed just liver cirrhosis with ascites but no other acute pathology. -I will do a diagnostic ultrasound-guided paracentesis  to rule out SBP. I will empirically place him on IV ceftriaxone. He is currently afebrile with a normal white cell count.  3. Essential hypertension-continue Norvasc, metoprolol, losartan.  4. Secondary hyperparathyroidism-continue PhosLo.  5. GERD-continue Protonix.    All the records are reviewed and case discussed with ED  provider. Management plans discussed with the patient, family and they are in agreement.  CODE STATUS: Full code  TOTAL TIME TAKING CARE OF THIS PATIENT: 45 minutes.    Henreitta Leber M.D on 11/27/2016 at 12:41 PM  Between 7am to 6pm - Pager - [phone]  After 6pm go to [email or web address] - password [email or web address]  Office  [phone]  CC: Primary care physician; [name], MD

## 2016-11-28 LAB — CBC
HCT: 35.3 % — ABNORMAL LOW (ref 40.0–52.0)
HEMOGLOBIN: 11.8 g/dL — AB (ref 13.0–18.0)
MCH: 32.5 pg (ref 26.0–34.0)
MCHC: 33.5 g/dL (ref 32.0–36.0)
MCV: 96.9 fL (ref 80.0–100.0)
Platelets: 133 10*3/uL — ABNORMAL LOW (ref 150–440)
RBC: 3.64 MIL/uL — AB (ref 4.40–5.90)
RDW: 15.5 % — ABNORMAL HIGH (ref 11.5–14.5)
WBC: 3.9 10*3/uL (ref 3.8–10.6)

## 2016-11-28 LAB — BASIC METABOLIC PANEL
ANION GAP: 9 (ref 5–15)
BUN: 22 mg/dL — ABNORMAL HIGH (ref 6–20)
CHLORIDE: 97 mmol/L — AB (ref 101–111)
CO2: 31 mmol/L (ref 22–32)
Calcium: 8.7 mg/dL — ABNORMAL LOW (ref 8.9–10.3)
Creatinine, Ser: 6.24 mg/dL — ABNORMAL HIGH (ref 0.61–1.24)
GFR calc non Af Amer: 10 mL/min — ABNORMAL LOW (ref >60)
GFR, EST AFRICAN AMERICAN: 11 mL/min — AB (ref >60)
GLUCOSE: 81 mg/dL (ref 65–99)
Potassium: 5.2 mmol/L — ABNORMAL HIGH (ref 3.5–5.1)
Sodium: 137 mmol/L (ref 135–145)

## 2016-11-28 LAB — PATHOLOGIST SMEAR REVIEW

## 2016-11-28 LAB — ALBUMIN, FLUID (OTHER): Albumin, Body Fluid Other: 2.5 g/dL

## 2016-11-28 LAB — PARATHYROID HORMONE, INTACT (NO CA): PTH: 1167 pg/mL — AB (ref 15–65)

## 2016-11-28 LAB — HEPATITIS B SURFACE ANTIGEN: Hepatitis B Surface Ag: NEGATIVE

## 2016-11-28 NOTE — Care Management (Signed)
It is anticipated patient will discharge home over the weekend and there will be a need for oral antibiotic.  Obtained script and [facility].  Updated weekend CM and primary nurse  to be on the alert for this medication (Cipro 500mg  1 po hs # 7.

## 2016-11-28 NOTE — Progress Notes (Signed)
[facility]  ROUNDING NOTE   Subjective:  Patient well-known to [place] from prior admissions. Presents now with significant right upper quadrant abdominal pain. He was found to be hyperkalemic yesterday upon admission. He underwent hemodialysis successfully. Potassium still slightly high this a.m. Interview conducted with translator present.   Objective:  Vital signs in last 24 hours:  Temp:  [98.2 F (36.8 C)-98.5 F (36.9 C)] 98.5 F (36.9 C) (04/27 1221) Pulse Rate:  [60-72] 60 (04/27 1221) Resp:  [12-18] 18 (04/27 1221) BP: (116-141)/(57-74) 116/57 (04/27 1221) SpO2:  [97 %-100 %] 99 % (04/27 1221) Weight:  [72.4 kg (159 lb 11.2 oz)-76 kg (167 lb 8.8 oz)] 72.4 kg (159 lb 11.2 oz) (04/26 2153)  Weight change:  Filed Weights   11/27/16 1800 11/27/16 2100 11/27/16 2153  Weight: 76 kg (167 lb 8.8 oz) 75 kg (165 lb 5.5 oz) 72.4 kg (159 lb 11.2 oz)    Intake/Output: I/O last 3 completed shifts: In: 240 [P.O.:240] Out: 1.5 [Other:1.5]   Intake/Output this shift:  Total I/O In: 410 [P.O.:360; IV Piggyback:50] Out: 0   Physical Exam: General: No acute distress  Head: Normocephalic, atraumatic. Moist oral mucosal membranes  Eyes: Anicteric  Neck: Supple, trachea midline  Lungs:  Clear to auscultation, normal effort  Heart: S1S2 no rubs  Abdomen:  Tenderness overlying RUQ and into right lower chest, BS present.  Extremities:  trace peripheral edema.  Neurologic: Awake, alert, following commands  Skin: No lesions  Access: LUE AVF    Basic Metabolic Panel:  Recent Labs Lab 11/25/16 2337 11/27/16 0944 11/27/16 1800 11/28/16 0504  NA 135 130*  --  137  K 5.4* 6.7* 5.8* 5.2*  CL 98* 93*  --  97*  CO2 25 23  --  31  GLUCOSE 104* 88  --  81  BUN 34* 49*  --  22*  CREATININE 7.88* 10.21*  --  6.24*  CALCIUM 9.3 8.8*  --  8.7*  PHOS  --  9.9*  --   --     Liver Function Tests:  Recent Labs Lab 11/25/16 2337 11/27/16 0944  AST 31 22  ALT 21 17   ALKPHOS 194* 179*  BILITOT 1.2 1.1  PROT 7.6 7.9  ALBUMIN 3.7 3.8    Recent Labs Lab 11/25/16 2337 11/27/16 0944  LIPASE 40 40   No results for input(s): AMMONIA in the last 168 hours.  CBC:  Recent Labs Lab 11/25/16 2337 11/27/16 0944 11/28/16 0504  WBC 5.5 6.0 3.9  HGB 12.0* 12.3* 11.8*  HCT 35.3* 36.7* 35.3*  MCV 96.2 97.7 96.9  PLT 168 145* 133*    Cardiac Enzymes:  Recent Labs Lab 11/25/16 2337  TROPONINI <0.03    BNP: Invalid input(s): POCBNP  CBG: No results for input(s): GLUCAP in the last 168 hours.  Microbiology: Results for orders placed or performed during the hospital encounter of 11/27/16  Body fluid culture     Status: None (Preliminary result)   Collection Time: 11/27/16  2:05 PM  Result Value Ref Range Status   Specimen Description PERITONEAL  Final   Special Requests NONE  Final   Gram Stain   Final    ABUNDANT WBC PRESENT, PREDOMINANTLY MONONUCLEAR NO ORGANISMS SEEN    Culture   Final    NO GROWTH < 24 HOURS Performed at [facility], [place] [address] [place], [place] [address]    Report Status PENDING  Incomplete    Coagulation Studies: No results  for input(s): LABPROT, INR in the last 72 hours.  Urinalysis: No results for input(s): COLORURINE, LABSPEC, PHURINE, GLUCOSEU, HGBUR, BILIRUBINUR, KETONESUR, PROTEINUR, UROBILINOGEN, NITRITE, LEUKOCYTESUR in the last 72 hours.  Invalid input(s): APPERANCEUR    Imaging: US Paracentesis  Result Date: 11/27/2016 INDICATION: End-stage renal disease, CHF, cirrhosis, abdominal pain, recurrent ascites ; request received for diagnostic and therapeutic paracentesis. EXAM: ULTRASOUND GUIDED DIAGNOSTIC AND THERAPEUTIC PARACENTESIS MEDICATIONS: None. COMPLICATIONS: None immediate. PROCEDURE: Informed written consent was obtained from the patient after a discussion of the risks, benefits and alternatives to treatment. A timeout was performed prior to the initiation of the procedure.  Initial ultrasound scanning demonstrates a small amount of ascites within the right lower abdominal quadrant. The right lower abdomen was prepped and draped in the usual sterile fashion. 1% lidocaine was used for local anesthesia. Following this, a Yueh catheter was introduced. An ultrasound image was saved for documentation purposes. The paracentesis was performed. The catheter was removed and a dressing was applied. The patient tolerated the procedure well without immediate post procedural complication. FINDINGS: A total of approximately 900 cc of slightly hazy, amber fluid was removed. Samples were sent to the laboratory as requested by the clinical team. IMPRESSION: Successful ultrasound-guided diagnostic and therapeutic paracentesis yielding 900 cc of peritoneal fluid. Read by: Rowe Robert, PA-C Electronically Signed   By: Sandi Mariscal M.D.   On: 11/27/2016 16:25     Medications:   . sodium chloride    . sodium chloride    . cefTRIAXone (ROCEPHIN)  IV Stopped (11/28/16 1342)   . amLODipine  10 mg Oral Daily  . calcium acetate  1,334 mg Oral TID WC  . cefTRIAXone  1 g Intravenous Once  . ferric citrate  420 mg Oral TID WC  . heparin  5,000 Units Subcutaneous Q8H  . losartan  100 mg Oral Daily  . metoprolol tartrate  25 mg Oral BID  . pantoprazole  40 mg Oral Daily  . sodium chloride flush  3 mL Intravenous Q12H   sodium chloride, sodium chloride, acetaminophen **OR** acetaminophen, alteplase, diphenhydrAMINE, heparin, lidocaine (PF), lidocaine-prilocaine, morphine injection, ondansetron **OR** ondansetron (ZOFRAN) IV, pentafluoroprop-tetrafluoroeth  Assessment/ Plan:  46 y.o. male with end-stage renal disease, hypertension, Alcohol inducedcirrhosis of the liver, ascites  [facility], [place]., Tuesday, Thursday, Saturday  1. End-stage renal disease 2. Hyperkalemia, improved 3. Ascites from alcoholic liver cirrhosis and RUQ abdominal pain 4. SHPTH 5. HTN 6. Anemia of  CKD, hgb 11.8  Plan:  Patient was admitted this time with severe right upper quadrant abdominal pain.  He has known ascites.  He is going for right upper quadrant abdominal ultrasound today.  Patient underwent hemodialysis yesterday.  No urgent indication for dialysis today.  We will plan for dialysis again tomorrow.  No indication for Epogen at this moment in time.  We plan to check serum phosphorus again tomorrow as well.  Further plan as patient progresses.    LOS: 1 Cierah Crader 4/27/20183:01 PM

## 2016-11-28 NOTE — Progress Notes (Signed)
Patient ID: Mario [name], male   DOB: [birth year], 46 y.o.   MRN: [id number]  [employer] PROGRESS NOTE  Mario [name] BOF:[id number] DOB: 1970/10/10 DOA: 11/27/2016 PCP: Mario Legato, MD  HPI/Subjective: Patient seen with translator and nursing staff. Patient complains of 7 out of 10 abdominal pain. Mostly in his right upper quadrant. He feels a little bit better than yesterday. He states he can't sleep on his right side secondary to the pain. No nausea or vomiting. No diarrhea.  Objective: Vitals:   11/28/16 0817 11/28/16 1221  BP: 120/60 (!) 116/57  Pulse: 63 60  Resp: 12 18  Temp: 98.2 F (36.8 C) 98.5 F (36.9 C)    Filed Weights   11/27/16 1800 11/27/16 2100 11/27/16 2153  Weight: 76 kg (167 lb 8.8 oz) 75 kg (165 lb 5.5 oz) 72.4 kg (159 lb 11.2 oz)    ROS: Review of Systems  Constitutional: Negative for chills and fever.  Eyes: Negative for blurred vision.  Respiratory: Negative for cough and shortness of breath.   Cardiovascular: Positive for chest pain.  Gastrointestinal: Positive for abdominal pain. Negative for constipation, diarrhea, nausea and vomiting.  Genitourinary: Negative for dysuria.  Musculoskeletal: Negative for joint pain.  Neurological: Negative for dizziness and headaches.   Exam: Physical Exam  Constitutional: He is oriented to person, place, and time.  HENT:  Nose: No mucosal edema.  Mouth/Throat: No oropharyngeal exudate or posterior oropharyngeal edema.  Eyes: Conjunctivae, EOM and lids are normal. Pupils are equal, round, and reactive to light.  Neck: No JVD present. Carotid bruit is not present. No edema present. No thyroid mass and no thyromegaly present.  Cardiovascular: S1 normal and S2 normal.  Exam reveals no gallop.   No murmur heard. Pulses:      Dorsalis pedis pulses are 2+ on the right side, and 2+ on the left side.  Respiratory: No respiratory distress. He has decreased breath sounds in the right lower field and  the left lower field. He has no wheezes. He has no rhonchi. He has no rales.  GI: Soft. Bowel sounds are normal. There is tenderness in the right upper quadrant.  Musculoskeletal:       Right ankle: He exhibits no swelling.       Left ankle: He exhibits no swelling.  Lymphadenopathy:    He has no cervical adenopathy.  Neurological: He is alert and oriented to person, place, and time. No cranial nerve deficit.  Skin: Skin is warm. No rash noted. Nails show no clubbing.  Psychiatric: He has a normal mood and affect.      Data Reviewed: Basic Metabolic Panel:  Recent Labs Lab 11/25/16 2337 11/27/16 0944 11/27/16 1800 11/28/16 0504  NA 135 130*  --  137  K 5.4* 6.7* 5.8* 5.2*  CL 98* 93*  --  97*  CO2 25 23  --  31  GLUCOSE 104* 88  --  81  BUN 34* 49*  --  22*  CREATININE 7.88* 10.21*  --  6.24*  CALCIUM 9.3 8.8*  --  8.7*  PHOS  --  9.9*  --   --    Liver Function Tests:  Recent Labs Lab 11/25/16 2337 11/27/16 0944  AST 31 22  ALT 21 17  ALKPHOS 194* 179*  BILITOT 1.2 1.1  PROT 7.6 7.9  ALBUMIN 3.7 3.8    Recent Labs Lab 11/25/16 2337 11/27/16 0944  LIPASE 40 40   CBC:  Recent Labs Lab 11/25/16 2337 11/27/16  5409 11/28/16 0504  WBC 5.5 6.0 3.9  HGB 12.0* 12.3* 11.8*  HCT 35.3* 36.7* 35.3*  MCV 96.2 97.7 96.9  PLT 168 145* 133*   Cardiac Enzymes:  Recent Labs Lab 11/25/16 2337  TROPONINI <0.03   BNP (last 3 results)  Recent Labs  04/11/16 1459 10/13/16 1307  BNP 1,988.0* 784.0*      Recent Results (from the past 240 hour(s))  Body fluid culture     Status: None (Preliminary result)   Collection Time: 11/27/16  2:05 PM  Result Value Ref Range Status   Specimen Description PERITONEAL  Final   Special Requests NONE  Final   Gram Stain   Final    ABUNDANT WBC PRESENT, PREDOMINANTLY MONONUCLEAR NO ORGANISMS SEEN    Culture   Final    NO GROWTH < 24 HOURS Performed at [facility], [place] [address] [place], [place]  [address]    Report Status PENDING  Incomplete     Studies: US Paracentesis  Result Date: 11/27/2016 INDICATION: End-stage renal disease, CHF, cirrhosis, abdominal pain, recurrent ascites ; request received for diagnostic and therapeutic paracentesis. EXAM: ULTRASOUND GUIDED DIAGNOSTIC AND THERAPEUTIC PARACENTESIS MEDICATIONS: None. COMPLICATIONS: None immediate. PROCEDURE: Informed written consent was obtained from the patient after a discussion of the risks, benefits and alternatives to treatment. A timeout was performed prior to the initiation of the procedure. Initial ultrasound scanning demonstrates a small amount of ascites within the right lower abdominal quadrant. The right lower abdomen was prepped and draped in the usual sterile fashion. 1% lidocaine was used for local anesthesia. Following this, a Yueh catheter was introduced. An ultrasound image was saved for documentation purposes. The paracentesis was performed. The catheter was removed and a dressing was applied. The patient tolerated the procedure well without immediate post procedural complication. FINDINGS: A total of approximately 900 cc of slightly hazy, amber fluid was removed. Samples were sent to the laboratory as requested by the clinical team. IMPRESSION: Successful ultrasound-guided diagnostic and therapeutic paracentesis yielding 900 cc of peritoneal fluid. Read by: Rowe Robert, PA-C Electronically Signed   By: Sandi Mariscal M.D.   On: 11/27/2016 16:25    Scheduled Meds: . amLODipine  10 mg Oral Daily  . calcium acetate  1,334 mg Oral TID WC  . cefTRIAXone  1 g Intravenous Once  . ferric citrate  420 mg Oral TID WC  . heparin  5,000 Units Subcutaneous Q8H  . losartan  100 mg Oral Daily  . metoprolol tartrate  25 mg Oral BID  . pantoprazole  40 mg Oral Daily  . sodium chloride flush  3 mL Intravenous Q12H   Continuous Infusions: . sodium chloride    . sodium chloride    . cefTRIAXone (ROCEPHIN)  IV 1 g (11/28/16 1312)     Assessment/Plan:  1. Right upper quadrant abdominal pain with history of cirrhosis and ascites. Empiric treatment for spontaneous bacterial peritonitis with IV Rocephin. There are quite a few white blood cells in the paracentesis but so far nothing growing in the culture. The patient had an ultrasound of the abdomen on 11/26/2016 that showed cirrhosis. CT scan of the chest showed the upper part of the abdomen which again showed cirrhosis. 2. Cirrhosis of the liver area and prior alcohol abuse. Patient states he stopped alcohol. Esophageal varices seen on CT scan. 3. Chronic systolic congestive heart failure. Dialysis to remove fluid. Patient on metoprolol. Patient also on losartan. 4. End-stage renal disease on dialysis. Patient had urgent  dialysis last night secondary to hyperkalemia. 5. Essential hypertension on metoprolol and losartan and amlodipine  Code Status:     Code Status Orders        Start     Ordered   11/27/16 1432  Full code  Continuous     11/27/16 1431    Code Status History    Date Active Date Inactive Code Status Order ID Comments User Context   10/28/2016  4:11 PM 10/31/2016  4:38 PM Full Code [id number]  Epifanio Lesches, MD ED   10/13/2016  9:36 PM 10/14/2016  9:23 PM Full Code [id number]  Lance Coon, MD Inpatient   08/28/2016  4:58 PM 08/30/2016  5:19 PM Full Code [id number]  Fritzi Mandes, MD ED   07/03/2016  8:06 PM 07/06/2016  4:22 PM Full Code [id number]  Fritzi Mandes, MD ED   04/11/2016  6:57 PM 04/12/2016  7:16 AM Full Code [id number]  Vaughan Basta, MD Inpatient   01/25/2015 12:29 PM 01/28/2015  4:26 PM Full Code [id number]  Dustin Flock, MD Inpatient     Disposition Plan: To be determined  Consultants:  Nephrology  Antibiotics:  Rocephin  Time spent: 28 minutes. Case discussed with nephrology and nursing staff and translator with patient.  Loletha Grayer  [employer]

## 2016-11-29 LAB — HEPATITIS C ANTIBODY: HCV Ab: <0.1 s/co ratio (ref 0.0–0.9)

## 2016-11-29 LAB — HEPATITIS B CORE ANTIBODY, TOTAL: HEP B C TOTAL AB: NEGATIVE

## 2016-11-29 LAB — PHOSPHORUS: Phosphorus: 8.4 mg/dL — ABNORMAL HIGH (ref 2.5–4.6)

## 2016-11-29 MED ORDER — METHYLPREDNISOLONE SODIUM SUCC 40 MG IJ SOLR
40.0000 mg | Freq: Every day | INTRAMUSCULAR | Status: DC
  Administered 2016-11-29: 40 mg via INTRAVENOUS
  Filled 2016-11-29: qty 1

## 2016-11-29 NOTE — Progress Notes (Signed)
Pre hd info 

## 2016-11-29 NOTE — Progress Notes (Signed)
Hd start 

## 2016-11-29 NOTE — Progress Notes (Signed)
[facility]  ROUNDING NOTE   Subjective:  Patient seen at bedside. Still having some pain overlying his right lower rib cage. He is due for hemodialysis today. Orders have been prepared.  Objective:  Vital signs in last 24 hours:  Temp:  [98.2 F (36.8 C)-98.5 F (36.9 C)] 98.3 F (36.8 C) (04/28 0349) Pulse Rate:  [60-68] 68 (04/28 0349) Resp:  [12-18] 16 (04/28 0349) BP: (113-120)/(57-63) 114/63 (04/28 0349) SpO2:  [96 %-99 %] 96 % (04/28 0349)  Weight change:  Filed Weights   11/27/16 1800 11/27/16 2100 11/27/16 2153  Weight: 76 kg (167 lb 8.8 oz) 75 kg (165 lb 5.5 oz) 72.4 kg (159 lb 11.2 oz)    Intake/Output: I/O last 3 completed shifts: In: 650 [P.O.:600; IV Piggyback:50] Out: 1.5 [Other:1.5]   Intake/Output this shift:  No intake/output data recorded.  Physical Exam: General: No acute distress  Head: Normocephalic, atraumatic. Moist oral mucosal membranes  Eyes: Anicteric  Neck: Supple, trachea midline  Lungs:  Clear to auscultation, normal effort  Heart: S1S2 no rubs  Abdomen:  Tenderness overlying RUQ and into right lower chest, BS present.  Extremities:  trace peripheral edema.  Neurologic: Awake, alert, following commands  Skin: No lesions  Access: LUE AVF    Basic Metabolic Panel:  Recent Labs Lab 11/25/16 2337 11/27/16 0944 11/27/16 1800 11/28/16 0504  NA 135 130*  --  137  K 5.4* 6.7* 5.8* 5.2*  CL 98* 93*  --  97*  CO2 25 23  --  31  GLUCOSE 104* 88  --  81  BUN 34* 49*  --  22*  CREATININE 7.88* 10.21*  --  6.24*  CALCIUM 9.3 8.8*  --  8.7*  PHOS  --  9.9*  --   --     Liver Function Tests:  Recent Labs Lab 11/25/16 2337 11/27/16 0944  AST 31 22  ALT 21 17  ALKPHOS 194* 179*  BILITOT 1.2 1.1  PROT 7.6 7.9  ALBUMIN 3.7 3.8    Recent Labs Lab 11/25/16 2337 11/27/16 0944  LIPASE 40 40   No results for input(s): AMMONIA in the last 168 hours.  CBC:  Recent Labs Lab 11/25/16 2337 11/27/16 0944  11/28/16 0504  WBC 5.5 6.0 3.9  HGB 12.0* 12.3* 11.8*  HCT 35.3* 36.7* 35.3*  MCV 96.2 97.7 96.9  PLT 168 145* 133*    Cardiac Enzymes:  Recent Labs Lab 11/25/16 2337  TROPONINI <0.03    BNP: Invalid input(s): POCBNP  CBG: No results for input(s): GLUCAP in the last 168 hours.  Microbiology: Results for orders placed or performed during the hospital encounter of 11/27/16  Body fluid culture     Status: None (Preliminary result)   Collection Time: 11/27/16  2:05 PM  Result Value Ref Range Status   Specimen Description PERITONEAL  Final   Special Requests NONE  Final   Gram Stain   Final    ABUNDANT WBC PRESENT, PREDOMINANTLY MONONUCLEAR NO ORGANISMS SEEN    Culture   Final    NO GROWTH < 24 HOURS Performed at [facility], [place] [address], [place], [place] [address]    Report Status PENDING  Incomplete    Coagulation Studies: No results for input(s): LABPROT, INR in the last 72 hours.  Urinalysis: No results for input(s): COLORURINE, LABSPEC, PHURINE, GLUCOSEU, HGBUR, BILIRUBINUR, KETONESUR, PROTEINUR, UROBILINOGEN, NITRITE, LEUKOCYTESUR in the last 72 hours.  Invalid input(s): APPERANCEUR    Imaging: US Paracentesis  Result Date: 11/27/2016 INDICATION: End-stage renal disease, CHF, cirrhosis, abdominal pain, recurrent ascites ; request received for diagnostic and therapeutic paracentesis. EXAM: ULTRASOUND GUIDED DIAGNOSTIC AND THERAPEUTIC PARACENTESIS MEDICATIONS: None. COMPLICATIONS: None immediate. PROCEDURE: Informed written consent was obtained from the patient after a discussion of the risks, benefits and alternatives to treatment. A timeout was performed prior to the initiation of the procedure. Initial ultrasound scanning demonstrates a small amount of ascites within the right lower abdominal quadrant. The right lower abdomen was prepped and draped in the usual sterile fashion. 1% lidocaine was used for local anesthesia. Following this, a Yueh catheter  was introduced. An ultrasound image was saved for documentation purposes. The paracentesis was performed. The catheter was removed and a dressing was applied. The patient tolerated the procedure well without immediate post procedural complication. FINDINGS: A total of approximately 900 cc of slightly hazy, amber fluid was removed. Samples were sent to the laboratory as requested by the clinical team. IMPRESSION: Successful ultrasound-guided diagnostic and therapeutic paracentesis yielding 900 cc of peritoneal fluid. Read by: Rowe Robert, PA-C Electronically Signed   By: Sandi Mariscal M.D.   On: 11/27/2016 16:25     Medications:   . sodium chloride    . sodium chloride    . cefTRIAXone (ROCEPHIN)  IV Stopped (11/28/16 1342)   . amLODipine  10 mg Oral Daily  . calcium acetate  1,334 mg Oral TID WC  . cefTRIAXone  1 g Intravenous Once  . ferric citrate  420 mg Oral TID WC  . heparin  5,000 Units Subcutaneous Q8H  . losartan  100 mg Oral Daily  . metoprolol tartrate  25 mg Oral BID  . pantoprazole  40 mg Oral Daily  . sodium chloride flush  3 mL Intravenous Q12H   sodium chloride, sodium chloride, acetaminophen **OR** acetaminophen, alteplase, diphenhydrAMINE, heparin, lidocaine (PF), lidocaine-prilocaine, morphine injection, ondansetron **OR** ondansetron (ZOFRAN) IV, pentafluoroprop-tetrafluoroeth  Assessment/ Plan:  46 y.o. male with end-stage renal disease, hypertension, Alcohol inducedcirrhosis of the liver, ascites  [facility], [place]., Tuesday, Thursday, Saturday  1. End-stage renal disease 2. Hyperkalemia, improved 3. Ascites from alcoholic liver cirrhosis and RUQ abdominal pain 4. SHPTH 5. HTN 6. Anemia of CKD, hgb 11.8  Plan:  Patient due for hemodialysis per his usual schedule today. Orders have been prepared. We will plan to dialyze him against a 2K bath. We also plan to check serum phosphorus today. Patient will be continued on ferric citrate. No indication  for Epogen at the moment as most recent hemoglobin was 11.8. We will continue to follow his progress.    LOS: 2 Andretta Ergle 4/28/20187:29 AM

## 2016-11-29 NOTE — Progress Notes (Signed)
Received report on pt at this time. Rounded on pt, pt is currently eating a meal. #22 to R EJ is intact and patent, pt in no distress, offered no complaints.

## 2016-11-29 NOTE — Progress Notes (Signed)
Pre hd assessment  

## 2016-11-29 NOTE — Progress Notes (Signed)
Patient ID: Mario [name], male   DOB: Sep 21, 1970, 46 y.o.   MRN: [id number]   [facility] PROGRESS NOTE  Mario [name] YYT:[id number] DOB: 04-23-1971 DOA: 11/27/2016 PCP: [name], MD  HPI/Subjective: Patient seen now with the help of a translator and nursing staff. Patient states that with the pain medication the pain goes away for about 3 hours. Without the pain medication the pain is 10 out of 10 in intensity. He can't get comfortable. Painful when he takes a deep breath. His ribs feel like they're very sore.  Objective: Vitals:   11/29/16 1340 11/29/16 1346  BP: 126/67 129/69  Pulse: 63 64  Resp: 14 18  Temp: 98.3 F (36.8 C)     Filed Weights   11/27/16 2153 11/29/16 1040 11/29/16 1340  Weight: 72.4 kg (159 lb 11.2 oz) 75.1 kg (165 lb 9.1 oz) 73 kg (160 lb 15 oz)    ROS: Review of Systems  Constitutional: Negative for chills and fever.  Eyes: Negative for blurred vision.  Respiratory: Negative for cough and shortness of breath.   Cardiovascular: Positive for chest pain.  Gastrointestinal: Positive for abdominal pain. Negative for constipation, diarrhea, nausea and vomiting.  Genitourinary: Negative for dysuria.  Musculoskeletal: Negative for joint pain.  Neurological: Negative for dizziness and headaches.   Exam: Physical Exam  Constitutional: He is oriented to person, place, and time.  HENT:  Nose: No mucosal edema.  Mouth/Throat: No oropharyngeal exudate or posterior oropharyngeal edema.  Eyes: Conjunctivae, EOM and lids are normal. Pupils are equal, round, and reactive to light.  Neck: No JVD present. Carotid bruit is not present. No edema present. No thyroid mass and no thyromegaly present.  Cardiovascular: S1 normal and S2 normal.  Exam reveals no gallop.   No murmur heard. Pulses:      Dorsalis pedis pulses are 2+ on the right side, and 2+ on the left side.  Respiratory: No respiratory distress. He has decreased breath sounds in the  right lower field and the left lower field. He has no wheezes. He has no rhonchi. He has no rales.  GI: Soft. Bowel sounds are normal. There is tenderness in the right upper quadrant.  Musculoskeletal:       Right ankle: He exhibits no swelling.       Left ankle: He exhibits no swelling.  Lymphadenopathy:    He has no cervical adenopathy.  Neurological: He is alert and oriented to person, place, and time. No cranial nerve deficit.  Skin: Skin is warm. No rash noted. Nails show no clubbing.  Psychiatric: He has a normal mood and affect.      Data Reviewed: Basic Metabolic Panel:  Recent Labs Lab 11/25/16 2337 11/27/16 0944 11/27/16 1800 11/28/16 0504  NA 135 130*  --  137  K 5.4* 6.7* 5.8* 5.2*  CL 98* 93*  --  97*  CO2 25 23  --  31  GLUCOSE 104* 88  --  81  BUN 34* 49*  --  22*  CREATININE 7.88* 10.21*  --  6.24*  CALCIUM 9.3 8.8*  --  8.7*  PHOS  --  9.9*  --   --    Liver Function Tests:  Recent Labs Lab 11/25/16 2337 11/27/16 0944  AST 31 22  ALT 21 17  ALKPHOS 194* 179*  BILITOT 1.2 1.1  PROT 7.6 7.9  ALBUMIN 3.7 3.8    Recent Labs Lab 11/25/16 2337 11/27/16 0944  LIPASE 40 40   CBC:  Recent Labs Lab 11/25/16 2337 11/27/16 0944 11/28/16 0504  WBC 5.5 6.0 3.9  HGB 12.0* 12.3* 11.8*  HCT 35.3* 36.7* 35.3*  MCV 96.2 97.7 96.9  PLT 168 145* 133*   Cardiac Enzymes:  Recent Labs Lab 11/25/16 2337  TROPONINI <0.03   BNP (last 3 results)  Recent Labs  04/11/16 1459 10/13/16 1307  BNP 1,988.0* 784.0*      Recent Results (from the past 240 hour(s))  Body fluid culture     Status: None (Preliminary result)   Collection Time: 11/27/16  2:05 PM  Result Value Ref Range Status   Specimen Description PERITONEAL  Final   Special Requests NONE  Final   Gram Stain   Final    ABUNDANT WBC PRESENT, PREDOMINANTLY MONONUCLEAR NO ORGANISMS SEEN    Culture   Final    NO GROWTH 2 DAYS Performed at [facility], [place] [address],  [place], [place] [address]    Report Status PENDING  Incomplete     Studies: US Paracentesis  Result Date: 11/27/2016 INDICATION: End-stage renal disease, CHF, cirrhosis, abdominal pain, recurrent ascites ; request received for diagnostic and therapeutic paracentesis. EXAM: ULTRASOUND GUIDED DIAGNOSTIC AND THERAPEUTIC PARACENTESIS MEDICATIONS: None. COMPLICATIONS: None immediate. PROCEDURE: Informed written consent was obtained from the patient after a discussion of the risks, benefits and alternatives to treatment. A timeout was performed prior to the initiation of the procedure. Initial ultrasound scanning demonstrates a small amount of ascites within the right lower abdominal quadrant. The right lower abdomen was prepped and draped in the usual sterile fashion. 1% lidocaine was used for local anesthesia. Following this, a Yueh catheter was introduced. An ultrasound image was saved for documentation purposes. The paracentesis was performed. The catheter was removed and a dressing was applied. The patient tolerated the procedure well without immediate post procedural complication. FINDINGS: A total of approximately 900 cc of slightly hazy, amber fluid was removed. Samples were sent to the laboratory as requested by the clinical team. IMPRESSION: Successful ultrasound-guided diagnostic and therapeutic paracentesis yielding 900 cc of peritoneal fluid. Read by: Rowe Robert, PA-C Electronically Signed   By: Sandi Mariscal M.D.   On: 11/27/2016 16:25    Scheduled Meds: . amLODipine  10 mg Oral Daily  . calcium acetate  1,334 mg Oral TID WC  . cefTRIAXone  1 g Intravenous Once  . ferric citrate  420 mg Oral TID WC  . heparin  5,000 Units Subcutaneous Q8H  . losartan  100 mg Oral Daily  . methylPREDNISolone (SOLU-MEDROL) injection  40 mg Intravenous Daily  . metoprolol tartrate  25 mg Oral BID  . pantoprazole  40 mg Oral Daily  . sodium chloride flush  3 mL Intravenous Q12H   Continuous Infusions: . sodium  chloride    . sodium chloride    . cefTRIAXone (ROCEPHIN)  IV Stopped (11/28/16 1342)    Assessment/Plan:  1. Right upper quadrant abdominal pain with history of cirrhosis and ascites. Empiric treatment for spontaneous bacterial peritonitis with IV Rocephin. There are quite a few white blood cells in the paracentesis but so far nothing growing in the culture. The patient had an ultrasound of the abdomen on 11/26/2016 that showed cirrhosis. CT scan of the chest showed the upper part of the abdomen which again showed cirrhosis. 2. Right chest pain and rib pain to palpation. CT scan negative for pulmonary embolism. We'll start Solu-Medrol just in case this is inflammatory pain. 3. Cirrhosis of the liver area and prior  alcohol abuse. Patient states he stopped alcohol. Esophageal varices seen on CT scan. 4. Chronic systolic congestive heart failure. Dialysis to remove fluid. Patient on metoprolol. Patient also on losartan. 5. End-stage renal disease on dialysis. Dialysis has improved hyperkalemia on admission. 6. Essential hypertension on metoprolol and losartan and amlodipine  Code Status:     Code Status Orders        Start     Ordered   11/27/16 1432  Full code  Continuous     11/27/16 1431    Code Status History    Date Active Date Inactive Code Status Order ID Comments User Context   10/28/2016  4:11 PM 10/31/2016  4:38 PM Full Code [id number]  Epifanio Lesches, MD ED   10/13/2016  9:36 PM 10/14/2016  9:23 PM Full Code [id number]  Lance Coon, MD Inpatient   08/28/2016  4:58 PM 08/30/2016  5:19 PM Full Code [id number]  Fritzi Mandes, MD ED   07/03/2016  8:06 PM 07/06/2016  4:22 PM Full Code [id number]  Fritzi Mandes, MD ED   04/11/2016  6:57 PM 04/12/2016  7:16 AM Full Code [id number]  Vaughan Basta, MD Inpatient   01/25/2015 12:29 PM 01/28/2015  4:26 PM Full Code [id number]  Dustin Flock, MD Inpatient     Disposition Plan: Potentially home tomorrow if pain better after steroids  today.  Consultants:  Nephrology  Antibiotics:  Rocephin  Time spent: 26 minutes. Case discussed with nephrology and nursing staff and translator with patient.  Loletha Grayer  [employer]

## 2016-11-29 NOTE — Progress Notes (Signed)
Post hd vitals 

## 2016-11-29 NOTE — Progress Notes (Signed)
  End of hd 

## 2016-11-29 NOTE — Progress Notes (Signed)
Post hd assessment 

## 2016-11-29 NOTE — Progress Notes (Signed)
Pt ended HD tx 45 minutes early. Alert, no c/o, just says "Done, lets go." Report to primary RN. 1.8 L removed.

## 2016-11-30 MED ORDER — PREDNISONE 20 MG PO TABS
20.0000 mg | ORAL_TABLET | Freq: Once | ORAL | Status: AC
  Administered 2016-11-30: 20 mg via ORAL
  Filled 2016-11-30: qty 1

## 2016-11-30 MED ORDER — CIPROFLOXACIN HCL 500 MG PO TABS: 500.0000 mg | ORAL_TABLET | Freq: Every day | ORAL | 0 refills | Status: DC

## 2016-11-30 MED ORDER — PREDNISONE 5 MG PO TABS
ORAL_TABLET | ORAL | 0 refills | Status: DC
Start: 2016-12-01 — End: 2017-01-14

## 2016-11-30 MED ORDER — OXYCODONE HCL 5 MG PO TABS: 5.0000 mg | ORAL_TABLET | Freq: Four times a day (QID) | ORAL | Status: DC | PRN

## 2016-11-30 MED ORDER — OXYCODONE HCL 5 MG PO TABS: 5.0000 mg | ORAL_TABLET | Freq: Four times a day (QID) | ORAL | 0 refills | Status: DC | PRN

## 2016-11-30 NOTE — Care Management Note (Addendum)
Case Management Note  Patient Details  Name: Mario [name] MRN: [id number] Date of Birth: 04-03-1971  Subjective/Objective:      Discharge home today. Updated [place] RN to please send Mr Bernette Redbird Cipro 500mg  PO q HS #7 that are in his medication bin from the [facility], home with [name] today.               Action/Plan:   Expected Discharge Date:  11/30/16               Expected Discharge Plan:   D/C to home with PO ABX  In-House Referral:     Discharge planning Services   Discharge home with Cipro 500mg  PO 1 tab q HS #7 from the [facility] clinic.  Post Acute Care Choice:   PCP Dr Shelah Lewandowsky Choice offered to:   Patient  DME Arranged:   NA DME Agency:   NA  HH Arranged:   NA HH Agency:   NA  Status of Service:   Complete  If discussed at Long Length of Stay Meetings, dates discussed:    Additional Comments:  Makaela Cando A, RN 11/30/2016, 9:11 AM

## 2016-11-30 NOTE — Discharge Summary (Signed)
[place] [facility] [place] NAME: Mario [name]    MR#:  [id number]  DATE OF BIRTH:  1971-02-12  DATE OF ADMISSION:  11/27/2016 ADMITTING PHYSICIAN: Henreitta Leber, MD  DATE OF DISCHARGE: 11/30/2016 11:23 AM  PRIMARY CARE PHYSICIAN: [name], MD    ADMISSION DIAGNOSIS:  End stage renal disease ([place]) [N18.6] Hyperkalemia [E87.5] SBP (spontaneous bacterial peritonitis) (HCC) [K65.2] Pain of upper abdomen [R10.10] Non-intractable vomiting with nausea, unspecified vomiting type [R11.2]  DISCHARGE DIAGNOSIS:  Active Problems:   Hyperkalemia   SECONDARY DIAGNOSIS:   Past Medical History:  Diagnosis Date  . Chronic combined systolic and diastolic CHF (congestive heart failure) ([place])   . Cirrhosis ([place])   . Dialysis patient ([place])   . Hypertension   . Renal disorder   . Renal insufficiency     HOSPITAL COURSE:   1.  Right upper quadrant abdominal pain with history of cirrhosis and ascites. The patient was given empiric treatment for spontaneous bacterial peritonitis with IV Rocephin. Patient will be switched over to Cipro at night upon discharge home. There was a lot of white blood cells on the paracentesis but nothing grew out of the culture. With the patient having vague-type pain unclear if this was the source of his pain. 2. Right-sided chest pain and rib pain to palpation. CT scan of the chest negative for pulmonary embolism. I started Solu-Medrol and the patient's pain had almost gone away. I prescribed a prednisone taper upon going home. Likely costochondritis cause of the patient's pain. 3. History of cirrhosis of the liver with prior alcohol abuse. Patient states that he stopped alcohol. Esophageal varices on CT scan. Patient on metoprolol. 4. History of chronic systolic congestive heart. Dialysis to remove fluid. Patient on metoprolol and losartan 5. Severe hyperkalemia on presentation. Resolved with dialysis 6. End-stage  renal disease on dialysis. Continue dialysis Tuesday Thursday and Saturday 7. Essential hypertension on metoprolol and losartan and amlodipine  DISCHARGE CONDITIONS:   Satisfactory  CONSULTS OBTAINED:   nephrology  DRUG ALLERGIES:  No Known Allergies  DISCHARGE MEDICATIONS:   Discharge Medication List as of 11/30/2016 10:32 AM    START taking these medications   Details  ciprofloxacin (CIPRO) 500 MG tablet Take 1 tablet (500 mg total) by mouth at bedtime., Starting Sun 11/30/2016, Print    oxyCODONE (OXY IR/ROXICODONE) 5 MG immediate release tablet Take 1 tablet (5 mg total) by mouth every 6 (six) hours as needed for moderate pain., Starting Sun 11/30/2016, Print    predniSONE (DELTASONE) 5 MG tablet 4 tabs po day 1; 3 tabs po day2,3; 2 tab po day4,5; 1 tab po day6,7, Print      CONTINUE these medications which have NOT CHANGED   Details  amLODipine (NORVASC) 10 MG tablet Take 1 tablet (10 mg total) by mouth daily., Starting Fri 08/29/2016, Normal    calcium acetate (PHOSLO) 667 MG tablet Take 1,334 mg by mouth 3 (three) times daily with meals., Historical Med    ferric citrate (AURYXIA) 1 GM 210 MG(Fe) tablet Take 420 mg by mouth 3 (three) times daily with meals., Historical Med    losartan (COZAAR) 100 MG tablet Take 1 tablet (100 mg total) by mouth daily., Starting Tue 10/14/2016, Normal    metoprolol tartrate (LOPRESSOR) 25 MG tablet Take 1 tablet (25 mg total) by mouth 2 (two) times daily., Starting Wed 04/16/2016, Print    pantoprazole (PROTONIX) 40 MG tablet Take 1 tablet (40 mg total) by mouth  daily., Starting Tue 10/14/2016, Normal      STOP taking these medications     traMADol (ULTRAM) 50 MG tablet          DISCHARGE INSTRUCTIONS:   Follow-up with dialysis as scheduled Follow-up Dr. Holley Raring one week  If you experience worsening of your admission symptoms, develop shortness of breath, life threatening emergency, suicidal or homicidal thoughts you must seek  medical attention immediately by calling 911 or calling your MD immediately  if symptoms less severe.  You Must read complete instructions/literature along with all the possible adverse reactions/side effects for all the Medicines you take and that have been prescribed to you. Take any new Medicines after you have completely understood and accept all the possible adverse reactions/side effects.   Please note  You were cared for by a hospitalist during your hospital stay. If you have any questions about your discharge medications or the care you received while you were in the hospital after you are discharged, you can call the unit and asked to speak with the hospitalist on call if the hospitalist that took care of you is not available. Once you are discharged, your primary care physician will handle any further medical issues. Please note that NO REFILLS for any discharge medications will be authorized once you are discharged, as it is imperative that you return to your primary care physician (or establish a relationship with a primary care physician if you do not have one) for your aftercare needs so that they can reassess your need for medications and monitor your lab values.    Today   CHIEF COMPLAINT:   Chief Complaint  Patient presents with  . Abdominal Pain    HISTORY OF PRESENT ILLNESS:  Mario [name]  is a 46 y.o. male presented with abdominal pain and right-sided chest pain.   VITAL SIGNS:  Blood pressure 134/69, pulse 66, temperature 97.8 F (36.6 C), temperature source Oral, resp. rate 18, weight 73 kg (160 lb 15 oz), SpO2 97 %.    PHYSICAL EXAMINATION:  GENERAL:  46 y.o.-year-old patient lying in the bed with no acute distress.  EYES: Pupils equal, round, reactive to light and accommodation. No scleral icterus. Extraocular muscles intact.  HEENT: Head atraumatic, normocephalic. Oropharynx and nasopharynx clear.  NECK:  Supple, no jugular venous distention. No thyroid  enlargement, no tenderness.  LUNGS: Normal breath sounds bilaterally, no wheezing, rales,rhonchi or crepitation. No use of accessory muscles of respiration.  CARDIOVASCULAR: S1, S2 normal. No murmurs, rubs, or gallops. Slight pain to palpation over the right ribs but less than yesterday. ABDOMEN: Soft, non-tender, Slight distended. Bowel sounds present. No organomegaly or mass.  EXTREMITIES: No pedal edema, cyanosis, or clubbing.  NEUROLOGIC: Cranial nerves II through XII are intact. Muscle strength 5/5 in all extremities. Sensation intact. Gait not checked.  PSYCHIATRIC: The patient is alert and oriented x 3.  SKIN: No obvious rash, lesion, or ulcer.   DATA REVIEW:   CBC  Recent Labs Lab 11/28/16 0504  WBC 3.9  HGB 11.8*  HCT 35.3*  PLT 133*    Chemistries   Recent Labs Lab 11/27/16 0944  11/28/16 0504  NA 130*  --  137  K 6.7*  < > 5.2*  CL 93*  --  97*  CO2 23  --  31  GLUCOSE 88  --  81  BUN 49*  --  22*  CREATININE 10.21*  --  6.24*  CALCIUM 8.8*  --  8.7*  AST 22  --   --  ALT 17  --   --   ALKPHOS 179*  --   --   BILITOT 1.1  --   --   < > = values in this interval not displayed.  Cardiac Enzymes  Recent Labs Lab 11/25/16 2337  TROPONINI <0.03    Microbiology Results  Results for orders placed or performed during the hospital encounter of 11/27/16  Body fluid culture     Status: None (Preliminary result)   Collection Time: 11/27/16  2:05 PM  Result Value Ref Range Status   Specimen Description PERITONEAL  Final   Special Requests NONE  Final   Gram Stain   Final    ABUNDANT WBC PRESENT, PREDOMINANTLY MONONUCLEAR NO ORGANISMS SEEN    Culture   Final    NO GROWTH 3 DAYS Performed at [facility], [address] [place], [place] [address]    Report Status PENDING  Incomplete     Management plans discussed with the patient, family and they are in agreement.  CODE STATUS:  Code Status History    Date Active Date Inactive Code Status  Order ID Comments User Context   11/27/2016  2:31 PM 11/30/2016  2:23 PM Full Code [id number]  Henreitta Leber, MD Inpatient   10/28/2016  4:11 PM 10/31/2016  4:38 PM Full Code [id number]  Epifanio Lesches, MD ED   10/13/2016  9:36 PM 10/14/2016  9:23 PM Full Code [id number]  Lance Coon, MD Inpatient   08/28/2016  4:58 PM 08/30/2016  5:19 PM Full Code [id number]  Fritzi Mandes, MD ED   07/03/2016  8:06 PM 07/06/2016  4:22 PM Full Code [id number]  Fritzi Mandes, MD ED   04/11/2016  6:57 PM 04/12/2016  7:16 AM Full Code [id number]  Vaughan Basta, MD Inpatient   01/25/2015 12:29 PM 01/28/2015  4:26 PM Full Code [id number]  Dustin Flock, MD Inpatient      TOTAL TIME TAKING CARE OF THIS PATIENT: 35 minutes.    Loletha Grayer M.D on 11/30/2016 at 2:50 PM  Between 7am to 6pm - Pager - [phone]  After 6pm go to [email or web address] - password [email or web address]  [facility] Office  [phone]  CC: Primary care physician; [name], MD

## 2016-11-30 NOTE — Progress Notes (Signed)
Patient discharged home as ordered,instructions explained and well understood,cipro medication handed to him,prescriptions given,vital; signs within normal limits,escorted by staff member via wheel chair.

## 2016-11-30 NOTE — Progress Notes (Signed)
[facility]  ROUNDING NOTE   Subjective:  Patient seen at bedside. Minimal right upper quadrant pain now. Patient did terminate dialysis treatment earlier yesterday. 1.8 L of ultrafiltration performed yesterday.  Objective:  Vital signs in last 24 hours:  Temp:  [97.8 F (36.6 C)-98.3 F (36.8 C)] 97.8 F (36.6 C) (04/29 0740) Pulse Rate:  [60-72] 66 (04/29 0740) Resp:  [12-18] 18 (04/29 0428) BP: (113-140)/(64-79) 134/69 (04/29 0740) SpO2:  [95 %-100 %] 97 % (04/29 0740) Weight:  [73 kg (160 lb 15 oz)-75.1 kg (165 lb 9.1 oz)] 73 kg (160 lb 15 oz) (04/28 1340)  Weight change:  Filed Weights   11/27/16 2153 11/29/16 1040 11/29/16 1340  Weight: 72.4 kg (159 lb 11.2 oz) 75.1 kg (165 lb 9.1 oz) 73 kg (160 lb 15 oz)    Intake/Output: I/O last 3 completed shifts: In: 120 [P.O.:120] Out: 1800 [Other:1800]   Intake/Output this shift:  No intake/output data recorded.  Physical Exam: General: No acute distress  Head: Normocephalic, atraumatic. Moist oral mucosal membranes  Eyes: Anicteric  Neck: Supple, trachea midline  Lungs:  Clear to auscultation, normal effort  Heart: S1S2 no rubs  Abdomen:  Minimal tenderness RUQ, BS present.  Extremities:  trace peripheral edema.  Neurologic: Awake, alert, following commands  Skin: No lesions  Access: LUE AVF    Basic Metabolic Panel:  Recent Labs Lab 11/25/16 2337 11/27/16 0944 11/27/16 1800 11/28/16 0504 11/29/16 1105  NA 135 130*  --  137  --   K 5.4* 6.7* 5.8* 5.2*  --   CL 98* 93*  --  97*  --   CO2 25 23  --  31  --   GLUCOSE 104* 88  --  81  --   BUN 34* 49*  --  22*  --   CREATININE 7.88* 10.21*  --  6.24*  --   CALCIUM 9.3 8.8*  --  8.7*  --   PHOS  --  9.9*  --   --  8.4*    Liver Function Tests:  Recent Labs Lab 11/25/16 2337 11/27/16 0944  AST 31 22  ALT 21 17  ALKPHOS 194* 179*  BILITOT 1.2 1.1  PROT 7.6 7.9  ALBUMIN 3.7 3.8    Recent Labs Lab 11/25/16 2337 11/27/16 0944   LIPASE 40 40   No results for input(s): AMMONIA in the last 168 hours.  CBC:  Recent Labs Lab 11/25/16 2337 11/27/16 0944 11/28/16 0504  WBC 5.5 6.0 3.9  HGB 12.0* 12.3* 11.8*  HCT 35.3* 36.7* 35.3*  MCV 96.2 97.7 96.9  PLT 168 145* 133*    Cardiac Enzymes:  Recent Labs Lab 11/25/16 2337  TROPONINI <0.03    BNP: Invalid input(s): POCBNP  CBG: No results for input(s): GLUCAP in the last 168 hours.  Microbiology: Results for orders placed or performed during the hospital encounter of 11/27/16  Body fluid culture     Status: None (Preliminary result)   Collection Time: 11/27/16  2:05 PM  Result Value Ref Range Status   Specimen Description PERITONEAL  Final   Special Requests NONE  Final   Gram Stain   Final    ABUNDANT WBC PRESENT, PREDOMINANTLY MONONUCLEAR NO ORGANISMS SEEN    Culture   Final    NO GROWTH 2 DAYS Performed at [facility], [address] [place], [place] [address]    Report Status PENDING  Incomplete    Coagulation Studies: No results for input(s): LABPROT,  INR in the last 72 hours.  Urinalysis: No results for input(s): COLORURINE, LABSPEC, PHURINE, GLUCOSEU, HGBUR, BILIRUBINUR, KETONESUR, PROTEINUR, UROBILINOGEN, NITRITE, LEUKOCYTESUR in the last 72 hours.  Invalid input(s): APPERANCEUR    Imaging: No results found.   Medications:   . sodium chloride    . sodium chloride    . cefTRIAXone (ROCEPHIN)  IV Stopped (11/28/16 1342)   . amLODipine  10 mg Oral Daily  . calcium acetate  1,334 mg Oral TID WC  . cefTRIAXone  1 g Intravenous Once  . ferric citrate  420 mg Oral TID WC  . heparin  5,000 Units Subcutaneous Q8H  . losartan  100 mg Oral Daily  . metoprolol tartrate  25 mg Oral BID  . pantoprazole  40 mg Oral Daily  . predniSONE  20 mg Oral Once  . sodium chloride flush  3 mL Intravenous Q12H   sodium chloride, sodium chloride, acetaminophen **OR** acetaminophen, alteplase, diphenhydrAMINE, heparin, lidocaine  (PF), lidocaine-prilocaine, morphine injection, ondansetron **OR** ondansetron (ZOFRAN) IV, oxyCODONE, pentafluoroprop-tetrafluoroeth  Assessment/ Plan:  46 y.o. male with end-stage renal disease, hypertension, Alcohol inducedcirrhosis of the liver, ascites  [facility], [place]., Tuesday, Thursday, Saturday  1. End-stage renal disease 2. Hyperkalemia, improved 3. Ascites from alcoholic liver cirrhosis and RUQ abdominal pain 4. SHPTH 5. HTN 6. Anemia of CKD, hgb 11.8  Plan:  Patient had hemodialysis yesterday. He cut the treatment short slightly. Ultrafiltration achieved was 1.8 kg. Recommend continued periodic monitoring of renal function as well as serum potassium. His right upper quadrant pain has improved significantly. Otherwise plan for his underlying ascites and right upper quadrant abdominal pain per hospitalist.    LOS: 3 Plato Alspaugh 4/29/20189:23 AM

## 2016-12-01 LAB — BODY FLUID CULTURE: CULTURE: NO GROWTH

## 2017-01-06 ENCOUNTER — Observation Stay
Admission: EM | Admit: 2017-01-06 | Discharge: 2017-01-07 | Disposition: A | Discharge status: Home / Self Care | Payer: Self-pay | Attending: Specialist | Admitting: Specialist

## 2017-01-06 ENCOUNTER — Encounter: Payer: Self-pay | Admitting: Emergency Medicine

## 2017-01-06 DIAGNOSIS — Z992 Dependence on renal dialysis: Secondary | ICD-10-CM | POA: Insufficient documentation

## 2017-01-06 DIAGNOSIS — Z888 Allergy status to other drugs, medicaments and biological substances status: Secondary | ICD-10-CM | POA: Insufficient documentation

## 2017-01-06 DIAGNOSIS — N186 End stage renal disease: Secondary | ICD-10-CM | POA: Insufficient documentation

## 2017-01-06 DIAGNOSIS — L299 Pruritus, unspecified: Secondary | ICD-10-CM | POA: Insufficient documentation

## 2017-01-06 DIAGNOSIS — I5042 Chronic combined systolic (congestive) and diastolic (congestive) heart failure: Secondary | ICD-10-CM | POA: Insufficient documentation

## 2017-01-06 DIAGNOSIS — K7030 Alcoholic cirrhosis of liver without ascites: Secondary | ICD-10-CM | POA: Insufficient documentation

## 2017-01-06 DIAGNOSIS — R21 Rash and other nonspecific skin eruption: Secondary | ICD-10-CM

## 2017-01-06 DIAGNOSIS — E875 Hyperkalemia: Principal | ICD-10-CM | POA: Insufficient documentation

## 2017-01-06 DIAGNOSIS — Z79899 Other long term (current) drug therapy: Secondary | ICD-10-CM | POA: Insufficient documentation

## 2017-01-06 DIAGNOSIS — F17210 Nicotine dependence, cigarettes, uncomplicated: Secondary | ICD-10-CM | POA: Insufficient documentation

## 2017-01-06 DIAGNOSIS — N2581 Secondary hyperparathyroidism of renal origin: Secondary | ICD-10-CM | POA: Insufficient documentation

## 2017-01-06 DIAGNOSIS — K766 Portal hypertension: Secondary | ICD-10-CM | POA: Insufficient documentation

## 2017-01-06 DIAGNOSIS — I132 Hypertensive heart and chronic kidney disease with heart failure and with stage 5 chronic kidney disease, or end stage renal disease: Secondary | ICD-10-CM | POA: Insufficient documentation

## 2017-01-06 DIAGNOSIS — K710 Toxic liver disease with cholestasis: Secondary | ICD-10-CM | POA: Insufficient documentation

## 2017-01-06 LAB — COMPREHENSIVE METABOLIC PANEL
ALBUMIN: 3.8 g/dL (ref 3.5–5.0)
ALT: 14 U/L — AB (ref 17–63)
AST: 27 U/L (ref 15–41)
Alkaline Phosphatase: 217 U/L — ABNORMAL HIGH (ref 38–126)
Anion gap: 20 — ABNORMAL HIGH (ref 5–15)
BILIRUBIN TOTAL: 0.9 mg/dL (ref 0.3–1.2)
BUN: 67 mg/dL — AB (ref 6–20)
CHLORIDE: 92 mmol/L — AB (ref 101–111)
CO2: 19 mmol/L — ABNORMAL LOW (ref 22–32)
CREATININE: 15.28 mg/dL — AB (ref 0.61–1.24)
Calcium: 8.6 mg/dL — ABNORMAL LOW (ref 8.9–10.3)
GFR calc Af Amer: 4 mL/min — ABNORMAL LOW (ref >60)
GFR calc non Af Amer: 3 mL/min — ABNORMAL LOW (ref >60)
GLUCOSE: 84 mg/dL (ref 65–99)
Potassium: 5.8 mmol/L — ABNORMAL HIGH (ref 3.5–5.1)
Sodium: 131 mmol/L — ABNORMAL LOW (ref 135–145)
TOTAL PROTEIN: 7.5 g/dL (ref 6.5–8.1)

## 2017-01-06 LAB — CBC
HEMATOCRIT: 38.3 % — AB (ref 40.0–52.0)
Hemoglobin: 12.9 g/dL — ABNORMAL LOW (ref 13.0–18.0)
MCH: 32.9 pg (ref 26.0–34.0)
MCHC: 33.5 g/dL (ref 32.0–36.0)
MCV: 98.2 fL (ref 80.0–100.0)
Platelets: 133 10*3/uL — ABNORMAL LOW (ref 150–440)
RBC: 3.91 MIL/uL — AB (ref 4.40–5.90)
RDW: 15.8 % — ABNORMAL HIGH (ref 11.5–14.5)
WBC: 4.9 10*3/uL (ref 3.8–10.6)

## 2017-01-06 LAB — SEDIMENTATION RATE: SED RATE: 10 mm/h (ref 0–15)

## 2017-01-06 LAB — PHOSPHORUS: PHOSPHORUS: 12 mg/dL — AB (ref 2.5–4.6)

## 2017-01-06 LAB — MRSA PCR SCREENING: MRSA BY PCR: NEGATIVE

## 2017-01-06 MED ORDER — ACETAMINOPHEN 325 MG PO TABS: 650.0000 mg | ORAL_TABLET | Freq: Four times a day (QID) | ORAL | Status: DC | PRN

## 2017-01-06 MED ORDER — METOPROLOL TARTRATE 25 MG PO TABS
25.0000 mg | ORAL_TABLET | Freq: Two times a day (BID) | ORAL | Status: DC
  Administered 2017-01-06 – 2017-01-07 (×2): 25 mg via ORAL
  Filled 2017-01-06 (×2): qty 1

## 2017-01-06 MED ORDER — ALTEPLASE 2 MG IJ SOLR: 2.0000 mg | Freq: Once | INTRAMUSCULAR | Status: DC | PRN

## 2017-01-06 MED ORDER — SODIUM CHLORIDE 0.9 % IV SOLN: 100.0000 mL | INTRAVENOUS | Status: DC | PRN

## 2017-01-06 MED ORDER — CHOLESTYRAMINE 4 G PO PACK: 4.0000 g | PACK | Freq: Three times a day (TID) | ORAL | 12 refills | Status: DC

## 2017-01-06 MED ORDER — DIPHENHYDRAMINE HCL 25 MG PO CAPS
25.0000 mg | ORAL_CAPSULE | Freq: Once | ORAL | Status: AC
  Administered 2017-01-06: 25 mg via ORAL
  Filled 2017-01-06: qty 1

## 2017-01-06 MED ORDER — ONDANSETRON HCL 4 MG/2ML IJ SOLN: 4.0000 mg | Freq: Four times a day (QID) | INTRAMUSCULAR | Status: DC | PRN

## 2017-01-06 MED ORDER — ACETAMINOPHEN 650 MG RE SUPP: 650.0000 mg | Freq: Four times a day (QID) | RECTAL | Status: DC | PRN

## 2017-01-06 MED ORDER — ONDANSETRON HCL 4 MG PO TABS: 4.0000 mg | ORAL_TABLET | Freq: Four times a day (QID) | ORAL | Status: DC | PRN

## 2017-01-06 MED ORDER — DIPHENHYDRAMINE HCL 25 MG PO CAPS
25.0000 mg | ORAL_CAPSULE | Freq: Four times a day (QID) | ORAL | Status: DC | PRN
Start: 2017-01-06 — End: 2017-01-07
  Administered 2017-01-06: 25 mg via ORAL
  Filled 2017-01-06: qty 1

## 2017-01-06 MED ORDER — HEPARIN SODIUM (PORCINE) 1000 UNIT/ML DIALYSIS
1000.0000 [IU] | INTRAMUSCULAR | Status: DC | PRN
  Filled 2017-01-06: qty 1

## 2017-01-06 MED ORDER — HEPARIN SODIUM (PORCINE) 5000 UNIT/ML IJ SOLN
5000.0000 [IU] | Freq: Three times a day (TID) | INTRAMUSCULAR | Status: DC
  Administered 2017-01-06: 5000 [IU] via SUBCUTANEOUS
  Filled 2017-01-06: qty 1

## 2017-01-06 MED ORDER — CHOLESTYRAMINE LIGHT 4 G PO PACK
4.0000 g | PACK | Freq: Two times a day (BID) | ORAL | Status: DC
  Administered 2017-01-06: 4 g via ORAL
  Filled 2017-01-06 (×4): qty 1

## 2017-01-06 MED ORDER — CALCIUM ACETATE (PHOS BINDER) 667 MG PO CAPS
1334.0000 mg | ORAL_CAPSULE | Freq: Three times a day (TID) | ORAL | Status: DC
Start: 2017-01-06 — End: 2017-01-07
  Administered 2017-01-07: 1334 mg via ORAL
  Filled 2017-01-06: qty 2

## 2017-01-06 MED ORDER — LIDOCAINE HCL (PF) 1 % IJ SOLN
5.0000 mL | INTRAMUSCULAR | Status: DC | PRN
  Filled 2017-01-06: qty 5

## 2017-01-06 MED ORDER — PENTAFLUOROPROP-TETRAFLUOROETH EX AERO
1.0000 "application " | INHALATION_SPRAY | CUTANEOUS | Status: DC | PRN
  Filled 2017-01-06: qty 30

## 2017-01-06 MED ORDER — PANTOPRAZOLE SODIUM 40 MG PO TBEC
40.0000 mg | DELAYED_RELEASE_TABLET | Freq: Every day | ORAL | Status: DC
  Administered 2017-01-07: 40 mg via ORAL
  Filled 2017-01-06: qty 1

## 2017-01-06 MED ORDER — LIDOCAINE-PRILOCAINE 2.5-2.5 % EX CREA
1.0000 "application " | TOPICAL_CREAM | CUTANEOUS | Status: DC | PRN
  Filled 2017-01-06: qty 5

## 2017-01-06 MED ORDER — SODIUM POLYSTYRENE SULFONATE 15 GM/60ML PO SUSP
15.0000 g | Freq: Once | ORAL | Status: AC
  Administered 2017-01-06: 15 g via ORAL
  Filled 2017-01-06: qty 60

## 2017-01-06 MED ORDER — AMLODIPINE BESYLATE 5 MG PO TABS
10.0000 mg | ORAL_TABLET | Freq: Every day | ORAL | Status: DC
  Administered 2017-01-07: 10 mg via ORAL
  Filled 2017-01-06: qty 2

## 2017-01-06 NOTE — H&P (Signed)
Patient Demographics  Mario [name], is a 46 y.o. male   MRN: [id number]   DOB - 05-20-1971  Admit Date - 01/06/2017    Outpatient Primary MD for the patient is [name], MD  Consult requested in the Hospital by Epifanio Lesches, MD, On 01/06/2017    Reason for consult ; generalized itching  History of present illness: 46 year old male patient with history of alcohol liver cirrhosis, portal hypertension, ESRD on hemodialysis Tuesday Thursday Saturday comes in today because of generalized pruritus since 1 week and patient has been taking Benadryl without relief. Patient has a itching all over the body and he is presenting to the ER with same problem. And did not go for dialysis today. Last  HD  Is on  Saturday. ER physician  Asked  me for consult on this patient. used Spanish interpreter  With History of -  Past Medical History:  Diagnosis Date  . Chronic combined systolic and diastolic CHF (congestive heart failure) ([place])   . Cirrhosis ([place])   . Dialysis patient ([place])   . Hypertension   . Renal disorder   . Renal insufficiency       Past Surgical History:  Procedure Laterality Date  . AV FISTULA PLACEMENT       Chief Complaint  Patient presents with  . Leg Swelling  . Rash  . Pruritis        Review of Systems    In addition to the HPI above, No Fever-chills, No Headache, No changes with Vision or hearing, No problems swallowing food or Liquids, No Chest pain, Cough or Shortness of Breath, No Abdominal pain, No Nausea or Vommitting, Bowel movements are regular, No Blood in stool or Urine, No dysuria, Generalized itching No new joints pains-aches,  No new weakness, tingling, numbness in any extremity, No recent weight gain or loss, No polyuria, polydypsia or polyphagia, No  significant Mental Stressors.  A full 10 point Review of Systems was done, except as stated above, all other Review of Systems were negative.   Social History Social History  Substance Use Topics  . Smoking status: Current Every Day Smoker    Packs/day: 0.25    Years: 25.00    Types: Cigarettes  . Smokeless tobacco: Never Used  . Alcohol use No     Comment: 1/2 bottle tequilla per day     Family History Family History  Problem Relation Age of Onset  . Kidney failure Father   . Diabetes Mother      Prior to Admission medications   Medication Sig Start Date End Date Taking? Authorizing Provider  amLODipine (NORVASC) 10 MG tablet Take 1 tablet (10 mg total) by mouth daily. 08/29/16  Yes Sudini, Alveta Heimlich, MD  calcium acetate (PHOSLO) 667 MG tablet Take 1,334 mg by mouth 3 (three) times daily with meals.   Yes [provider]  ciprofloxacin (CIPRO) 500 MG tablet Take 1 tablet (500 mg total) by mouth at bedtime. 11/30/16  Yes Loletha Grayer, MD  diphenhydrAMINE (BENADRYL) 25 mg capsule Take 25 mg by mouth every 6 (six) hours as needed.   Yes [provider]  losartan (COZAAR) 100 MG tablet Take 1 tablet (100 mg total) by mouth daily. 10/14/16  Yes Fritzi Mandes, MD  metoprolol tartrate (LOPRESSOR) 25 MG tablet Take 1 tablet (25 mg total) by mouth 2 (two) times daily. 04/16/16  Yes Sainani, Belia Heman, MD  pantoprazole (PROTONIX) 40 MG tablet Take 1 tablet (40 mg total) by mouth daily. 10/14/16  Yes Fritzi Mandes, MD  oxyCODONE (OXY IR/ROXICODONE) 5 MG immediate release tablet Take 1 tablet (5 mg total) by mouth every 6 (six) hours as needed for moderate pain. Patient not taking: Reported on 01/06/2017 11/30/16   Loletha Grayer, MD  predniSONE (DELTASONE) 5 MG tablet 4 tabs po day 1; 3 tabs po day2,3; 2 tab po day4,5; 1 tab po day6,7 12/01/16   Loletha Grayer, MD    Anti-infectives    None      Scheduled Meds: Continuous Infusions: PRN Meds:.  Allergies  Allergen  Reactions  . Betadine [Povidone Iodine] Rash    Physical Exam  Vitals  Blood pressure (!) 153/81, pulse (!) 123, temperature 97.6 F (36.4 C), temperature source Oral, resp. rate (!) 8, SpO2 100 %.   1. General ;Alert, awake, oriented. Uncomfortable because of and has been itching constantly.  2. Normal affect and insight, Not Suicidal or Homicidal, Awake Alert, Oriented X 3.  3. No F.N deficits, ALL C.Nerves Intact, Strength 5/5 all 4 extremities, Sensation intact all 4 extremities, Plantars down going.  4. Ears and Eyes appear Normal, Conjunctivae clear, PERRLA. Moist Oral Mucosa.  5. Supple Neck, No JVD, No cervical lymphadenopathy appriciated, No Carotid Bruits.  6. Symmetrical Chest wall movement, Good air movement bilaterally, CTAB.  7. RRR, No Gallops, Rubs or Murmurs, No Parasternal Heave.  8. Positive Bowel Sounds, abdomen distended with ascites, no tenderness.  9.  No Cyanosis, skin rash all over the face, both upper and lower extremities.   10. Good muscle tone,  joints appear normal , no effusions, Normal ROM.  11. No Palpable Lymph Nodes in Neck or Axillae    Data Review  CBC  Recent Labs Lab 01/06/17 0937  WBC 4.9  HGB 12.9*  HCT 38.3*  PLT 133*  MCV 98.2  MCH 32.9  MCHC 33.5  RDW 15.8*   ------------------------------------------------------------------------------------------------------------------  Chemistries   Recent Labs Lab 01/06/17 0937  NA 131*  K 5.8*  CL 92*  CO2 19*  GLUCOSE 84  BUN 67*  CREATININE 15.28*  CALCIUM 8.6*  AST 27  ALT 14*  ALKPHOS 217*  BILITOT 0.9   ------------------------------------------------------------------------------------------------------------------ CrCl cannot be calculated (Unknown ideal weight.). ------------------------------------------------------------------------------------------------------------------ No results for input(s): TSH, T4TOTAL, T3FREE, THYROIDAB in the last 72  hours.  Invalid input(s): FREET3   Coagulation profile No results for input(s): INR, PROTIME in the last 168 hours. ------------------------------------------------------------------------------------------------------------------- No results for input(s): DDIMER in the last 72 hours. -------------------------------------------------------------------------------------------------------------------  Cardiac Enzymes No results for input(s): CKMB, TROPONINI, MYOGLOBIN in the last 168 hours.  Invalid input(s): CK ------------------------------------------------------------------------------------------------------------------ Invalid input(s): POCBNP   ---------------------------------------------------------------------------------------------------------------  Urinalysis No results found for: COLORURINE, APPEARANCEUR, LABSPEC, PHURINE, GLUCOSEU, HGBUR, BILIRUBINUR, KETONESUR, PROTEINUR, UROBILINOGEN, NITRITE, LEUKOCYTESUR   Imaging results:   No results found.     Assessment & Plan  Active Problems:   Hyperkalemia    1. Generalized pruritus in a patient with liver cirrhosis; and has moderate pruritus likely secondary to cholestasis in a patient with  cirrhosis;recommend use by acid sequestrant and also rifampin as follow up  With hepatologist.. Patient can get Benadryl for symptomatic relief and more dense.  Alkaline phosphatase is elevated because of liver cirrhosis. Patient supposed to be discharged from the emergency room but patient did not have transportation on time to reach for hemodialysis today so we decided to keep him in the hospital.  Hyperkalemia patient received Kayexalate, patient needs hemodialysis today. ESRD on hemodialysis Tuesday, Thursday, Saturday, Thank you for the consult, we will follow the patient with you in the Hospital. Time spent;55 min  Naithen Rivenburg M.D on 01/06/2017 at 3:42 PM  Note: This dictation was prepared with Dragon dictation  along with smaller phrase technology. Any transcriptional errors that result from this process are unintentional.

## 2017-01-06 NOTE — ED Notes (Signed)
Patient assessed with spanish interpreter.  Patient reports itching/rash to arms and legs began approx. 8 days ago along with nausea.  Patient states he has not eaten anything for the past 3 days.  Patient last had dialysis Saturday.  Patient reports he vomited approx. 3 times in the last 24 hours.  Patient reports feeling weak.  Patient denies abdominal pain, diarrhea, and dizziness.  Patient denies chest pain and shortness of breath.

## 2017-01-06 NOTE — Consult Note (Signed)
Patient Demographics  Mario [name], is a 46 y.o. male   MRN: [id number]   DOB - [birth year]  Admit Date - 01/06/2017    Outpatient Primary MD for the patient is [name], MD  Consult requested in the Hospital by Lavonia Drafts, MD, On 01/06/2017    Reason for consult ; generalized itching  History of present illness: 46 year old male patient with history of alcohol liver cirrhosis, portal hypertension, ESRD on hemodialysis Tuesday Thursday Saturday comes in today because of generalized pruritus since 1 week and patient has been taking Benadryl without relief. Patient has a itching all over the body and he is presenting to the ER with same problem. And did not go for dialysis today. Last  HD  Is on  Saturday. ER physician  Asked  me for consult on this patient. used Spanish interpreter  With History of -  Past Medical History:  Diagnosis Date  . Chronic combined systolic and diastolic CHF (congestive heart failure) ([place])   . Cirrhosis ([place])   . Dialysis patient ([place])   . Hypertension   . Renal disorder   . Renal insufficiency       Past Surgical History:  Procedure Laterality Date  . AV FISTULA PLACEMENT       Chief Complaint  Patient presents with  . Leg Swelling  . Rash  . Pruritis        Review of Systems    In addition to the HPI above, No Fever-chills, No Headache, No changes with Vision or hearing, No problems swallowing food or Liquids, No Chest pain, Cough or Shortness of Breath, No Abdominal pain, No Nausea or Vommitting, Bowel movements are regular, No Blood in stool or Urine, No dysuria, Generalized itching No new joints pains-aches,  No new weakness, tingling, numbness in any extremity, No recent weight gain or loss, No polyuria, polydypsia or polyphagia, No significant  Mental Stressors.  A full 10 point Review of Systems was done, except as stated above, all other Review of Systems were negative.   Social History Social History  Substance Use Topics  . Smoking status: Current Every Day Smoker    Packs/day: 0.25    Years: 25.00    Types: Cigarettes  . Smokeless tobacco: Never Used  . Alcohol use No     Comment: 1/2 bottle tequilla per day     Family History Family History  Problem Relation Age of Onset  . Kidney failure Father   . Diabetes Mother      Prior to Admission medications   Medication Sig Start Date End Date Taking? Authorizing Provider  amLODipine (NORVASC) 10 MG tablet Take 1 tablet (10 mg total) by mouth daily. 08/29/16  Yes Sudini, Alveta Heimlich, MD  calcium acetate (PHOSLO) 667 MG tablet Take 1,334 mg by mouth 3 (three) times daily with meals.   Yes [provider]  ciprofloxacin (CIPRO) 500 MG tablet Take 1 tablet (500 mg total) by mouth at bedtime. 11/30/16  Yes Loletha Grayer, MD  diphenhydrAMINE (BENADRYL) 25 mg capsule Take 25 mg by mouth every 6 (six) hours as needed.   Yes [provider]  losartan (COZAAR) 100 MG tablet Take 1 tablet (100 mg total) by mouth daily. 10/14/16  Yes Fritzi Mandes, MD  metoprolol tartrate (LOPRESSOR) 25 MG tablet Take 1 tablet (25 mg total) by mouth 2 (two) times daily. 04/16/16  Yes Sainani, Belia Heman, MD  pantoprazole (PROTONIX) 40 MG tablet Take 1 tablet (40 mg total) by mouth daily. 10/14/16  Yes Fritzi Mandes, MD  oxyCODONE (OXY IR/ROXICODONE) 5 MG immediate release tablet Take 1 tablet (5 mg total) by mouth every 6 (six) hours as needed for moderate pain. Patient not taking: Reported on 01/06/2017 11/30/16   Loletha Grayer, MD  predniSONE (DELTASONE) 5 MG tablet 4 tabs po day 1; 3 tabs po day2,3; 2 tab po day4,5; 1 tab po day6,7 12/01/16   Loletha Grayer, MD    Anti-infectives    None      Scheduled Meds: Continuous Infusions: PRN Meds:.  Allergies  Allergen Reactions  .  Betadine [Povidone Iodine] Rash    Physical Exam  Vitals  Blood pressure (!) 154/87, pulse 63, temperature 97.6 F (36.4 C), temperature source Oral, resp. rate 16, SpO2 100 %.   1. General ;Alert, awake, oriented. Uncomfortable because of and has been itching constantly.  2. Normal affect and insight, Not Suicidal or Homicidal, Awake Alert, Oriented X 3.  3. No F.N deficits, ALL C.Nerves Intact, Strength 5/5 all 4 extremities, Sensation intact all 4 extremities, Plantars down going.  4. Ears and Eyes appear Normal, Conjunctivae clear, PERRLA. Moist Oral Mucosa.  5. Supple Neck, No JVD, No cervical lymphadenopathy appriciated, No Carotid Bruits.  6. Symmetrical Chest wall movement, Good air movement bilaterally, CTAB.  7. RRR, No Gallops, Rubs or Murmurs, No Parasternal Heave.  8. Positive Bowel Sounds, abdomen distended with ascites, no tenderness.  9.  No Cyanosis, skin rash all over the face, both upper and lower extremities.   10. Good muscle tone,  joints appear normal , no effusions, Normal ROM.  11. No Palpable Lymph Nodes in Neck or Axillae    Data Review  CBC  Recent Labs Lab 01/06/17 0937  WBC 4.9  HGB 12.9*  HCT 38.3*  PLT 133*  MCV 98.2  MCH 32.9  MCHC 33.5  RDW 15.8*   ------------------------------------------------------------------------------------------------------------------  Chemistries   Recent Labs Lab 01/06/17 0937  NA 131*  K 5.8*  CL 92*  CO2 19*  GLUCOSE 84  BUN 67*  CREATININE 15.28*  CALCIUM 8.6*  AST 27  ALT 14*  ALKPHOS 217*  BILITOT 0.9   ------------------------------------------------------------------------------------------------------------------ CrCl cannot be calculated (Unknown ideal weight.). ------------------------------------------------------------------------------------------------------------------ No results for input(s): TSH, T4TOTAL, T3FREE, THYROIDAB in the last 72 hours.  Invalid input(s):  FREET3   Coagulation profile No results for input(s): INR, PROTIME in the last 168 hours. ------------------------------------------------------------------------------------------------------------------- No results for input(s): DDIMER in the last 72 hours. -------------------------------------------------------------------------------------------------------------------  Cardiac Enzymes No results for input(s): CKMB, TROPONINI, MYOGLOBIN in the last 168 hours.  Invalid input(s): CK ------------------------------------------------------------------------------------------------------------------ Invalid input(s): POCBNP   ---------------------------------------------------------------------------------------------------------------  Urinalysis No results found for: COLORURINE, APPEARANCEUR, LABSPEC, PHURINE, GLUCOSEU, HGBUR, BILIRUBINUR, KETONESUR, PROTEINUR, UROBILINOGEN, NITRITE, LEUKOCYTESUR   Imaging results:   No results found.     Assessment & Plan  Active Problems:   * No active hospital problems. *    1. Generalized pruritus in a patient with liver cirrhosis; and has moderate pruritus likely secondary to cholestasis in  a patient with cirrhosis;recommend use by acid sequestrant and also rifampin as follow up  With hepatologist.. Patient can get Benadryl for symptomatic relief and more dense. Patient can be discharged from  emergency room and have her dialysis. Alkaline phosphatase is elevated because of liver cirrhosis.  ESRD on hemodialysis Tuesday, Thursday, Saturday, now has mild hyperkalemia in the patient is due for dialysis, is hemodynamically stable can have a hemodialysis as an outpatient today. Thank you for the consult, we will follow the patient with you in the Hospital. Time spent;55 min  Inette Doubrava M.D on 01/06/2017 at 12:37 PM  Note: This dictation was prepared with Dragon dictation along with smaller phrase technology. Any transcriptional  errors that result from this process are unintentional.

## 2017-01-06 NOTE — ED Notes (Signed)
Pt assisted to the restroom. This is pt's 2nd BM since taking his medication.

## 2017-01-06 NOTE — ED Triage Notes (Signed)
Presents with itching to both arms  Swelling to both legs..both legs red and drainage noted to anterior lower legs  Pt is dialysis pt and was scheduled for today

## 2017-01-06 NOTE — ED Notes (Signed)
Pt was given a cab voucher to get to dialysis but cab co notified this RN that is was going to be over an hour and a half until pt could be transported. Pt noted to be vomiting outside the ER doors, edp notified and pt is to come back to room 8. Interpreter present during this conversation with pt.

## 2017-01-06 NOTE — ED Notes (Signed)
Interpreter and Brewer, RN are helping find a way for pt to get to his dialysis clinic.

## 2017-01-06 NOTE — Care Management (Signed)
Patient presented to the E with puriitis.  Medicine consult obtained and patient ws to have discharged from the ED and to proceed to his outpatient dialysis center by [facility].  it is documented that cab would not arrive for 1.5 hours and patient was vomiting.  Patient admitted.  Notified Elvera Bicker with [employer].  Patient's readmission risk is medium. ESRD dialysis patient without payor source, undocumented status, limited resources, etoh abuse and compliance issues.

## 2017-01-06 NOTE — ED Provider Notes (Addendum)
[facility] Emergency Department Provider Note   ____________________________________________    I have reviewed the triage vital signs and the nursing notes.   HISTORY  Chief Complaint Leg Swelling; Rash; and Pruritis    Spanish interpreter used  HPI Mario [name] is a 46 y.o. male who presents with complaints of rash. Patient reports rash which began on his legs which is pruritic and erythematous. He reports it is spread to his upper extremities and also to his chest and back now. He reports it is very pruritic. No fevers or chills reported. He was unable to get dialysis today because of the rash and he wanted it checked out. Denies shortness of breath. No cough. Denies any new medications.Does also have a history of alcoholic cirrhosis   Past Medical History:  Diagnosis Date  . Chronic combined systolic and diastolic CHF (congestive heart failure) ([place])   . Cirrhosis ([place])   . Dialysis patient ([place])   . Hypertension   . Renal disorder   . Renal insufficiency     Patient Active Problem List   Diagnosis Date Noted  . Hyperkalemia 11/27/2016  . Acute hyperkalemia 10/28/2016  . Chronic combined systolic and diastolic CHF (congestive heart failure) ([place]) 10/13/2016  . Cirrhosis ([place]) 10/13/2016  . HTN (hypertension) 10/13/2016  . Abdominal pain 08/28/2016  . Pulmonary edema 07/03/2016  . Fluid overload 04/12/2016  . Chest pain 04/11/2016  . Scrotal infection 01/25/2015  . ESRD (end stage renal disease) on dialysis ([place]) 01/25/2015    Past Surgical History:  Procedure Laterality Date  . AV FISTULA PLACEMENT      Prior to Admission medications   Medication Sig Start Date End Date Taking? Authorizing Provider  amLODipine (NORVASC) 10 MG tablet Take 1 tablet (10 mg total) by mouth daily. 08/29/16  Yes Sudini, Alveta Heimlich, MD  calcium acetate (PHOSLO) 667 MG tablet Take 1,334 mg by mouth 3 (three) times daily with meals.   Yes [provider]  ciprofloxacin (CIPRO) 500 MG tablet Take 1 tablet (500 mg total) by mouth at bedtime. 11/30/16  Yes Wieting, Richard, MD  diphenhydrAMINE (BENADRYL) 25 mg capsule Take 25 mg by mouth every 6 (six) hours as needed.   Yes [provider]  losartan (COZAAR) 100 MG tablet Take 1 tablet (100 mg total) by mouth daily. 10/14/16  Yes Fritzi Mandes, MD  metoprolol tartrate (LOPRESSOR) 25 MG tablet Take 1 tablet (25 mg total) by mouth 2 (two) times daily. 04/16/16  Yes Sainani, Belia Heman, MD  pantoprazole (PROTONIX) 40 MG tablet Take 1 tablet (40 mg total) by mouth daily. 10/14/16  Yes Fritzi Mandes, MD  cholestyramine Lucrezia Starch) 4 g packet Take 1 packet (4 g total) by mouth 3 (three) times daily with meals. 01/06/17   Lavonia Drafts, MD  oxyCODONE (OXY IR/ROXICODONE) 5 MG immediate release tablet Take 1 tablet (5 mg total) by mouth every 6 (six) hours as needed for moderate pain. Patient not taking: Reported on 01/06/2017 11/30/16   Loletha Grayer, MD  predniSONE (DELTASONE) 5 MG tablet 4 tabs po day 1; 3 tabs po day2,3; 2 tab po day4,5; 1 tab po day6,7 12/01/16   Loletha Grayer, MD     Allergies Betadine [povidone iodine]  Family History  Problem Relation Age of Onset  . Kidney failure Father   . Diabetes Mother     Social History Social History  Substance Use Topics  . Smoking status: Current Every Day Smoker    Packs/day: 0.25  Years: 25.00    Types: Cigarettes  . Smokeless tobacco: Never Used  . Alcohol use No     Comment: 1/2 bottle tequilla per day    Review of Systems  Constitutional: No fever/chills Eyes: No visual changes.  ENT: No sore throat. Cardiovascular: Denies chest pain. Respiratory: Denies shortness of breath. Gastrointestinal: No abdominal pain.  No nausea, no vomiting.   Genitourinary: Negative for dysuria. Musculoskeletal: Negative for back pain. Skin: As above Neurological: Negative for headaches     ____________________________________________   PHYSICAL EXAM:  VITAL SIGNS: ED Triage Vitals [01/06/17 0855]  Enc Vitals Group     BP (!) 174/73     Pulse Rate 62     Resp 20     Temp 97.6 F (36.4 C)     Temp Source Oral     SpO2 100 %     Weight      Height      Head Circumference      Peak Flow      Pain Score      Pain Loc      Pain Edu?      Excl. in [place]?     Constitutional: Alert and oriented. No acute distress.  Eyes: Conjunctivae are normal.   Nose: No congestion/rhinnorhea. Mouth/Throat: Mucous membranes are moist.    Cardiovascular: Normal rate, regular rhythm. Grossly normal heart sounds.  Good peripheral circulation. Respiratory: Normal respiratory effort.  No retractions. Lungs CTAB. Gastrointestinal: Soft and nontender. No distention.  No CVA tenderness.  Musculoskeletal:.  Warm and well perfused Neurologic:  Normal speech and language. No gross focal neurologic deficits are appreciated.  Skin:  Skin is warm, dry. Patient with erythematous rash primarily to the lower extremities with some ulceration on the medial aspects of both legs. Fluid on the lower extremities but more diffuse along the upper extremities and chest. No intraoral involvement. No evidence of cellulitis. Psychiatric: Mood and affect are normal. Speech and behavior are normal.  ____________________________________________   LABS (all labs ordered are listed, but only abnormal results are displayed)  Labs Reviewed  CBC - Abnormal; Notable for the following:       Result Value   RBC 3.91 (*)    Hemoglobin 12.9 (*)    HCT 38.3 (*)    RDW 15.8 (*)    Platelets 133 (*)    All other components within normal limits  COMPREHENSIVE METABOLIC PANEL - Abnormal; Notable for the following:    Sodium 131 (*)    Potassium 5.8 (*)    Chloride 92 (*)    CO2 19 (*)    BUN 67 (*)    Creatinine, Ser 15.28 (*)    Calcium 8.6 (*)    ALT 14 (*)    Alkaline Phosphatase 217 (*)    GFR calc  non Af Amer 3 (*)    GFR calc Af Amer 4 (*)    Anion gap 20 (*)    All other components within normal limits  SEDIMENTATION RATE   ____________________________________________  EKG  ED ECG REPORT I, Lavonia Drafts, the attending physician, personally viewed and interpreted this ECG.  Date: 01/06/2017 EKG Time: 2:35 PM Rate: 56 Rhythm: normal sinus rhythm QRS Axis: normal Intervals: normal ST/T Wave abnormalities: normal Conduction Disturbances: none Narrative Interpretation: unremarkable  ____________________________________________  RADIOLOGY  None ____________________________________________   PROCEDURES  Procedure(s) performed: No    Critical Care performed: No ____________________________________________   INITIAL IMPRESSION / ASSESSMENT AND PLAN / ED COURSE  Pertinent labs & imaging results that were available during my care of the patient were reviewed by me and considered in my medical decision making (see chart for details).  Patient with dialysis scheduled today, his potassium is mildly elevated because he has not been dialyzed yet. I discussed with Dr. Holley Raring of nephrology, recommends single dose of Kayexalate and outpatient dialysis today. We discussed with dialysis center they said they can get him dialyzed. Also internal medicine consulted Dr. Vianne Bulls recommends cholestyramine   ----------------------------------------- 2:23 PM on 01/06/2017 -----------------------------------------  Patient required taxi ride to dialysis and we provided taxi voucher but taxi Company is unable to get a cab here for another hour and a half, this will be to wait for him to get dialysis. Given his hyperkalemia he needs dialysis, will discuss with hospitalist for admission.    ____________________________________________   FINAL CLINICAL IMPRESSION(S) / ED DIAGNOSES  Final diagnoses:  Rash and nonspecific skin eruption  Hyperkalemia      NEW MEDICATIONS  STARTED DURING THIS VISIT:  New Prescriptions   CHOLESTYRAMINE (QUESTRAN) 4 G PACKET    Take 1 packet (4 g total) by mouth 3 (three) times daily with meals.     Note:  This document was prepared using Dragon voice recognition software and may include unintentional dictation errors.    Lavonia Drafts, MD 01/06/17 1304    Lavonia Drafts, MD 01/06/17 1427    Lavonia Drafts, MD 01/06/17 [phone]

## 2017-01-06 NOTE — Discharge Instructions (Signed)
Please go directly to dialysis as discussed

## 2017-01-06 NOTE — ED Notes (Signed)
Pt being brought back to room from waiting area due to pt vomiting. Pt is eating pretzels on arrival to room. Pt in NAD at this time.

## 2017-01-07 LAB — GLUCOSE, CAPILLARY: Glucose-Capillary: 78 mg/dL (ref 65–99)

## 2017-01-07 LAB — BASIC METABOLIC PANEL
Anion gap: 13 (ref 5–15)
BUN: 36 mg/dL — ABNORMAL HIGH (ref 6–20)
CALCIUM: 8.4 mg/dL — AB (ref 8.9–10.3)
CO2: 26 mmol/L (ref 22–32)
Chloride: 98 mmol/L — ABNORMAL LOW (ref 101–111)
Creatinine, Ser: 10.25 mg/dL — ABNORMAL HIGH (ref 0.61–1.24)
GFR, EST AFRICAN AMERICAN: 6 mL/min — AB (ref >60)
GFR, EST NON AFRICAN AMERICAN: 5 mL/min — AB (ref >60)
GLUCOSE: 69 mg/dL (ref 65–99)
Potassium: 4.3 mmol/L (ref 3.5–5.1)
SODIUM: 137 mmol/L (ref 135–145)

## 2017-01-07 LAB — CBC
HCT: 33.7 % — ABNORMAL LOW (ref 40.0–52.0)
HEMOGLOBIN: 11.7 g/dL — AB (ref 13.0–18.0)
MCH: 34.3 pg — ABNORMAL HIGH (ref 26.0–34.0)
MCHC: 34.6 g/dL (ref 32.0–36.0)
MCV: 99 fL (ref 80.0–100.0)
Platelets: 118 10*3/uL — ABNORMAL LOW (ref 150–440)
RBC: 3.41 MIL/uL — AB (ref 4.40–5.90)
RDW: 15.5 % — ABNORMAL HIGH (ref 11.5–14.5)
WBC: 4 10*3/uL (ref 3.8–10.6)

## 2017-01-07 LAB — PARATHYROID HORMONE, INTACT (NO CA): PTH: 1745 pg/mL — AB (ref 15–65)

## 2017-01-07 LAB — HEPATITIS B SURFACE ANTIGEN: HEP B S AG: NEGATIVE

## 2017-01-07 LAB — HEPATITIS B SURFACE ANTIBODY, QUANTITATIVE: Hepatitis B-Post: >1000 m[IU]/mL (ref >9.9)

## 2017-01-07 LAB — HEPATITIS B CORE ANTIBODY, TOTAL: HEP B C TOTAL AB: NEGATIVE

## 2017-01-07 MED ORDER — CALCIUM ACETATE (PHOS BINDER) 667 MG PO TABS: 2001.0000 mg | ORAL_TABLET | Freq: Three times a day (TID) | ORAL | Status: DC

## 2017-01-07 NOTE — Progress Notes (Signed)
[facility]  ROUNDING NOTE   Subjective:  Patient presented with acute rash yesterday. Has significant excoriations on his extensor surfaces. Patient noted to have severe hyperphosphatemia which is likely leading to skin deposition and subsequent pruritus. Patient did complete hemodialysis yesterday.   Objective:  Vital signs in last 24 hours:  Temp:  [97.5 F (36.4 C)-97.9 F (36.6 C)] 97.6 F (36.4 C) (06/06 0456) Pulse Rate:  [56-123] 65 (06/06 0456) Resp:  [8-22] 16 (06/06 0456) BP: (125-156)/(56-81) 150/81 (06/06 0456) SpO2:  [98 %-100 %] 99 % (06/06 0456) Weight:  [73 kg (160 lb 15 oz)-76.5 kg (168 lb 10.4 oz)] 74.6 kg (164 lb 6.4 oz) (06/06 0500)  Weight change:  Filed Weights   01/06/17 1710 01/06/17 2014 01/07/17 0500  Weight: 76.5 kg (168 lb 10.4 oz) 74.4 kg (164 lb 0.4 oz) 74.6 kg (164 lb 6.4 oz)    Intake/Output: I/O last 3 completed shifts: In: -  Out: 1900 [Other:1900]   Intake/Output this shift:  Total I/O In: 240 [P.O.:240] Out: -   Physical Exam: General: No acute distress  Head: Normocephalic, atraumatic. Moist oral mucosal membranes  Eyes: Anicteric  Neck: Supple, trachea midline  Lungs:  Clear to auscultation, normal effort  Heart: S1S2 no rubs  Abdomen:  Soft, Mild distention   Extremities: Trace peripheral edema.  Neurologic: Awake, alert, following commands  Skin: Excoriations and rash noted on both arms and lower legs  Access: LUE AVF    Basic Metabolic Panel:  Recent Labs Lab 01/06/17 0937 01/06/17 1700 01/07/17 0320  NA 131*  --  137  K 5.8*  --  4.3  CL 92*  --  98*  CO2 19*  --  26  GLUCOSE 84  --  69  BUN 67*  --  36*  CREATININE 15.28*  --  10.25*  CALCIUM 8.6*  --  8.4*  PHOS  --  12.0*  --     Liver Function Tests:  Recent Labs Lab 01/06/17 0937  AST 27  ALT 14*  ALKPHOS 217*  BILITOT 0.9  PROT 7.5  ALBUMIN 3.8   No results for input(s): LIPASE, AMYLASE in the last 168 hours. No results  for input(s): AMMONIA in the last 168 hours.  CBC:  Recent Labs Lab 01/06/17 0937 01/07/17 0320  WBC 4.9 4.0  HGB 12.9* 11.7*  HCT 38.3* 33.7*  MCV 98.2 99.0  PLT 133* 118*    Cardiac Enzymes: No results for input(s): CKTOTAL, CKMB, CKMBINDEX, TROPONINI in the last 168 hours.  BNP: Invalid input(s): POCBNP  CBG:  Recent Labs Lab 01/07/17 0804  GLUCAP 63    Microbiology: Results for orders placed or performed during the hospital encounter of 01/06/17  MRSA PCR Screening     Status: None   Collection Time: 01/06/17  9:00 PM  Result Value Ref Range Status   MRSA by PCR NEGATIVE NEGATIVE Final    Comment:        The GeneXpert MRSA Assay (FDA approved for NASAL specimens only), is one component of a comprehensive MRSA colonization surveillance program. It is not intended to diagnose MRSA infection nor to guide or monitor treatment for MRSA infections.     Coagulation Studies: No results for input(s): LABPROT, INR in the last 72 hours.  Urinalysis: No results for input(s): COLORURINE, LABSPEC, PHURINE, GLUCOSEU, HGBUR, BILIRUBINUR, KETONESUR, PROTEINUR, UROBILINOGEN, NITRITE, LEUKOCYTESUR in the last 72 hours.  Invalid input(s): APPERANCEUR    Imaging: No results found.   Medications:   .  sodium chloride    . sodium chloride     . amLODipine  10 mg Oral Daily  . calcium acetate  1,334 mg Oral TID WC  . cholestyramine light  4 g Oral BID  . heparin  5,000 Units Subcutaneous Q8H  . metoprolol tartrate  25 mg Oral BID  . pantoprazole  40 mg Oral Daily   sodium chloride, sodium chloride, acetaminophen **OR** acetaminophen, alteplase, diphenhydrAMINE, heparin, lidocaine (PF), lidocaine-prilocaine, ondansetron **OR** ondansetron (ZOFRAN) IV, pentafluoroprop-tetrafluoroeth  Assessment/ Plan:  46 y.o. male with end-stage renal disease, hypertension, Alcohol inducedcirrhosis of the liver, ascites  [facility], [place]., Tuesday, Thursday,  Saturday  1. End-stage renal disease 2. Hyperkalemia, improved 3. Ascites from alcoholic liver cirrhosis and RUQ abdominal pain 4. SHPTH 5. HTN 6. Anemia of CKD 7. Rash due to hyperphosphatemia.  Plan:   The patient originally presented to the emergency department with a rash. We suspect that his rash is secondary to hyperphosphatemia and subsequent skin pruritus. He has excoriations on both upper and lower extremities. We counseled the patient on phosphorous control and I recommended that calcium acetate be increased to 3 tablets by mouth 3 times a day with meals. Patient verbalized understanding of this. We also recommend Sarna lotion to be applied to the affected extremities. This was discussed with hospitalist. His hyperkalemia was corrected with dialysis yesterday. No acute indication for dialysis today.  Plan:    LOS: 1 Dayan Kreis 6/6/201812:12 PM

## 2017-01-07 NOTE — Discharge Summary (Signed)
[place] [facility] [place] NAME: Mario [name]    MR#:  [id number]  DATE OF BIRTH:  [birth year]  DATE OF ADMISSION:  01/06/2017 ADMITTING PHYSICIAN: Epifanio Lesches, MD  DATE OF DISCHARGE: 01/07/2017  PRIMARY CARE PHYSICIAN: [name], MD    ADMISSION DIAGNOSIS:  Hyperkalemia [E87.5] Rash and nonspecific skin eruption [R21]  DISCHARGE DIAGNOSIS:  Active Problems:   Hyperkalemia   SECONDARY DIAGNOSIS:   Past Medical History:  Diagnosis Date  . Chronic combined systolic and diastolic CHF (congestive heart failure) ([place])   . Cirrhosis ([place])   . Dialysis patient ([place])   . Hypertension   . Renal disorder   . Renal insufficiency     HOSPITAL COURSE:   46 year old male with past medical history of ESRD on HD, Cirrhosis, HTN, Secondary Hyperparathyroidism, came into hospital due to itching/Rash and also having missed his Hemodialysis.   1. Itching/Rash - due to Hyperphosphatemia  - pt's Phosphate binder has been increased and patient is to take 3 tablets of calcium acetate 3 times a day with meals as he was only taking 1 tablet. -Patient was also discharged on some Topical SARIN for itching.  2. End-stage renal disease on hemodialysis-patient will continue his dialysis on Tuesday Thursday Saturday. -No acute indication for dialysis prior to discharge.  3. Essential hypertension-patient will continue his Norvasc, losartan, metoprolol.  4. Secondary hyperparathyroidism-patient will continue his calcium acetate.  DISCHARGE CONDITIONS:   Stable  CONSULTS OBTAINED:  Treatment Team:  [name], MD  DRUG ALLERGIES:   Allergies  Allergen Reactions  . Betadine [Povidone Iodine] Rash    DISCHARGE MEDICATIONS:   Allergies as of 01/07/2017      Reactions   Betadine [povidone Iodine] Rash      Medication List    STOP taking these medications   oxyCODONE 5 MG immediate release tablet Commonly known as:  Oxy  IR/ROXICODONE     TAKE these medications   amLODipine 10 MG tablet Commonly known as:  NORVASC Take 1 tablet (10 mg total) by mouth daily.   calcium acetate 667 MG tablet Commonly known as:  PHOSLO Take 3 tablets (2,001 mg total) by mouth 3 (three) times daily with meals. What changed:  how much to take   cholestyramine 4 g packet Commonly known as:  QUESTRAN Take 1 packet (4 g total) by mouth 3 (three) times daily with meals.   ciprofloxacin 500 MG tablet Commonly known as:  CIPRO Take 1 tablet (500 mg total) by mouth at bedtime.   diphenhydrAMINE 25 mg capsule Commonly known as:  BENADRYL Take 25 mg by mouth every 6 (six) hours as needed.   losartan 100 MG tablet Commonly known as:  COZAAR Take 1 tablet (100 mg total) by mouth daily.   metoprolol tartrate 25 MG tablet Commonly known as:  LOPRESSOR Take 1 tablet (25 mg total) by mouth 2 (two) times daily.   pantoprazole 40 MG tablet Commonly known as:  PROTONIX Take 1 tablet (40 mg total) by mouth daily.   predniSONE 5 MG tablet Commonly known as:  DELTASONE 4 tabs po day 1; 3 tabs po day2,3; 2 tab po day4,5; 1 tab po day6,7         DISCHARGE INSTRUCTIONS:   DIET:  Renal diet  DISCHARGE CONDITION:  Stable  ACTIVITY:  Activity as tolerated  OXYGEN:  Home Oxygen: No.   Oxygen Delivery: room air  DISCHARGE LOCATION:  home   If you experience worsening  of your admission symptoms, develop shortness of breath, life threatening emergency, suicidal or homicidal thoughts you must seek medical attention immediately by calling 911 or calling your MD immediately  if symptoms less severe.  You Must read complete instructions/literature along with all the possible adverse reactions/side effects for all the Medicines you take and that have been prescribed to you. Take any new Medicines after you have completely understood and accpet all the possible adverse reactions/side effects.   Please note  You were cared  for by a hospitalist during your hospital stay. If you have any questions about your discharge medications or the care you received while you were in the hospital after you are discharged, you can call the unit and asked to speak with the hospitalist on call if the hospitalist that took care of you is not available. Once you are discharged, your primary care physician will handle any further medical issues. Please note that NO REFILLS for any discharge medications will be authorized once you are discharged, as it is imperative that you return to your primary care physician (or establish a relationship with a primary care physician if you do not have one) for your aftercare needs so that they can reassess your need for medications and monitor your lab values.     Today   Still having some itching but no other complaints. Seen with help of an interpreter.   VITAL SIGNS:  Blood pressure (!) 156/72, pulse 62, temperature 98.1 F (36.7 C), temperature source Oral, resp. rate 18, height 5\' 8"  (1.727 m), weight 74.6 kg (164 lb 6.4 oz), SpO2 98 %.  I/O:   Intake/Output Summary (Last 24 hours) at 01/07/17 1348 Last data filed at 01/07/17 1333  Gross per 24 hour  Intake              240 ml  Output             1900 ml  Net            -1660 ml    PHYSICAL EXAMINATION:  GENERAL:  46 y.o.-year-old patient lying in the bed with no acute distress.  EYES: Pupils equal, round, reactive to light and accommodation. No scleral icterus. Extraocular muscles intact.  HEENT: Head atraumatic, normocephalic. Oropharynx and nasopharynx clear.  NECK:  Supple, no jugular venous distention. No thyroid enlargement, no tenderness.  LUNGS: Normal breath sounds bilaterally, no wheezing, rales,rhonchi. No use of accessory muscles of respiration.  CARDIOVASCULAR: S1, S2 normal. No murmurs, rubs, or gallops.  ABDOMEN: Soft, non-tender, non-distended. Bowel sounds present. No organomegaly or mass.  EXTREMITIES: No pedal  edema, cyanosis, or clubbing.  NEUROLOGIC: Cranial nerves II through XII are intact. No focal motor or sensory defecits b/l.  PSYCHIATRIC: The patient is alert and oriented x 3. Good affect.  SKIN: No obvious rash, lesion, or ulcer. Eczematous rash on LE below the knees.   DATA REVIEW:   CBC  Recent Labs Lab 01/07/17 0320  WBC 4.0  HGB 11.7*  HCT 33.7*  PLT 118*    Chemistries   Recent Labs Lab 01/06/17 0937 01/07/17 0320  NA 131* 137  K 5.8* 4.3  CL 92* 98*  CO2 19* 26  GLUCOSE 84 69  BUN 67* 36*  CREATININE 15.28* 10.25*  CALCIUM 8.6* 8.4*  AST 27  --   ALT 14*  --   ALKPHOS 217*  --   BILITOT 0.9  --     Cardiac Enzymes No results for input(s): TROPONINI in  the last 168 hours.  Microbiology Results  Results for orders placed or performed during the hospital encounter of 01/06/17  MRSA PCR Screening     Status: None   Collection Time: 01/06/17  9:00 PM  Result Value Ref Range Status   MRSA by PCR NEGATIVE NEGATIVE Final    Comment:        The GeneXpert MRSA Assay (FDA approved for NASAL specimens only), is one component of a comprehensive MRSA colonization surveillance program. It is not intended to diagnose MRSA infection nor to guide or monitor treatment for MRSA infections.     RADIOLOGY:  No results found.    Management plans discussed with the patient, family and they are in agreement.  CODE STATUS:     Code Status Orders        Start     Ordered   01/06/17 1434  Full code  Continuous     01/06/17 1437          TOTAL TIME TAKING CARE OF THIS PATIENT: 40 minutes.    Henreitta Leber M.D on 01/07/2017 at 1:48 PM  Between 7am to 6pm - Pager - [phone]  After 6pm go to [email or web address] - Proofreader  [facility]  Office  [phone]  CC: Primary care physician; [name], MD

## 2017-01-07 NOTE — Plan of Care (Signed)
Problem: Pain Managment: Goal: General experience of comfort will improve Outcome: Progressing No complaints of pain this shift, just itching, treated with benadryl with relief  Problem: Tissue Perfusion: Goal: Risk factors for ineffective tissue perfusion will decrease Outcome: Progressing Heparin subcutaneously for VTE  Problem: Bowel/Gastric: Goal: Will not experience complications related to bowel motility Outcome: Completed/Met Date Met: 01/07/17 BM this shift

## 2017-01-07 NOTE — Progress Notes (Signed)
Interpreter used for discharge instructions. Pt is illiterate and does not read or write english or spanish. Reviewed instructions with patient. Instructed patient to purchase Sarin for itching per MD verbal instruction. Interpreter assisted with reviewing medication. Pt states that he will take the bus when he leaves the hospital and refuses a wheelchair at discharge.

## 2017-01-14 ENCOUNTER — Encounter: Payer: Self-pay | Admitting: *Deleted

## 2017-01-14 ENCOUNTER — Emergency Department: Payer: Medicaid Other

## 2017-01-14 ENCOUNTER — Emergency Department
Admission: EM | Admit: 2017-01-14 | Discharge: 2017-01-14 | Disposition: A | Discharge status: Home / Self Care | Payer: Medicaid Other | Attending: Emergency Medicine | Admitting: Emergency Medicine

## 2017-01-14 DIAGNOSIS — N186 End stage renal disease: Secondary | ICD-10-CM | POA: Insufficient documentation

## 2017-01-14 DIAGNOSIS — I110 Hypertensive heart disease with heart failure: Secondary | ICD-10-CM | POA: Insufficient documentation

## 2017-01-14 DIAGNOSIS — I5042 Chronic combined systolic (congestive) and diastolic (congestive) heart failure: Secondary | ICD-10-CM | POA: Insufficient documentation

## 2017-01-14 DIAGNOSIS — F17210 Nicotine dependence, cigarettes, uncomplicated: Secondary | ICD-10-CM | POA: Insufficient documentation

## 2017-01-14 DIAGNOSIS — Z992 Dependence on renal dialysis: Secondary | ICD-10-CM | POA: Insufficient documentation

## 2017-01-14 DIAGNOSIS — R21 Rash and other nonspecific skin eruption: Secondary | ICD-10-CM

## 2017-01-14 DIAGNOSIS — L299 Pruritus, unspecified: Secondary | ICD-10-CM

## 2017-01-14 DIAGNOSIS — L298 Other pruritus: Secondary | ICD-10-CM | POA: Insufficient documentation

## 2017-01-14 DIAGNOSIS — I132 Hypertensive heart and chronic kidney disease with heart failure and with stage 5 chronic kidney disease, or end stage renal disease: Secondary | ICD-10-CM | POA: Insufficient documentation

## 2017-01-14 DIAGNOSIS — Z79899 Other long term (current) drug therapy: Secondary | ICD-10-CM | POA: Insufficient documentation

## 2017-01-14 LAB — CBC WITH DIFFERENTIAL/PLATELET
Basophils Absolute: 0 10*3/uL (ref 0–0.1)
Basophils Relative: 0 %
EOS ABS: 0.2 10*3/uL (ref 0–0.7)
EOS PCT: 3 %
HCT: 35.8 % — ABNORMAL LOW (ref 40.0–52.0)
Hemoglobin: 11.9 g/dL — ABNORMAL LOW (ref 13.0–18.0)
LYMPHS ABS: 0.7 10*3/uL — AB (ref 1.0–3.6)
Lymphocytes Relative: 10 %
MCH: 33.6 pg (ref 26.0–34.0)
MCHC: 33.1 g/dL (ref 32.0–36.0)
MCV: 101.4 fL — ABNORMAL HIGH (ref 80.0–100.0)
Monocytes Absolute: 0.5 10*3/uL (ref 0.2–1.0)
Monocytes Relative: 7 %
Neutro Abs: 5.1 10*3/uL (ref 1.4–6.5)
Neutrophils Relative %: 80 %
PLATELETS: 150 10*3/uL (ref 150–440)
RBC: 3.53 MIL/uL — ABNORMAL LOW (ref 4.40–5.90)
RDW: 15.5 % — AB (ref 11.5–14.5)
WBC: 6.5 10*3/uL (ref 3.8–10.6)

## 2017-01-14 LAB — COMPREHENSIVE METABOLIC PANEL
ALT: 21 U/L (ref 17–63)
AST: 27 U/L (ref 15–41)
Albumin: 3.3 g/dL — ABNORMAL LOW (ref 3.5–5.0)
Alkaline Phosphatase: 161 U/L — ABNORMAL HIGH (ref 38–126)
Anion gap: 14 (ref 5–15)
BUN: 55 mg/dL — ABNORMAL HIGH (ref 6–20)
CO2: 25 mmol/L (ref 22–32)
Calcium: 8.1 mg/dL — ABNORMAL LOW (ref 8.9–10.3)
Chloride: 98 mmol/L — ABNORMAL LOW (ref 101–111)
Creatinine, Ser: 13.29 mg/dL — ABNORMAL HIGH (ref 0.61–1.24)
GFR calc Af Amer: 4 mL/min — ABNORMAL LOW (ref >60)
GFR calc non Af Amer: 4 mL/min — ABNORMAL LOW (ref >60)
Glucose, Bld: 113 mg/dL — ABNORMAL HIGH (ref 65–99)
Potassium: 4 mmol/L (ref 3.5–5.1)
Sodium: 137 mmol/L (ref 135–145)
Total Bilirubin: 0.9 mg/dL (ref 0.3–1.2)
Total Protein: 6.3 g/dL — ABNORMAL LOW (ref 6.5–8.1)

## 2017-01-14 LAB — LIPASE, BLOOD: Lipase: 38 U/L (ref 11–51)

## 2017-01-14 LAB — MAGNESIUM: MAGNESIUM: 2.7 mg/dL — AB (ref 1.7–2.4)

## 2017-01-14 LAB — PHOSPHORUS: Phosphorus: 5.5 mg/dL — ABNORMAL HIGH (ref 2.5–4.6)

## 2017-01-14 MED ORDER — DIPHENHYDRAMINE HCL 50 MG/ML IJ SOLN
INTRAMUSCULAR | Status: AC
  Filled 2017-01-14: qty 1

## 2017-01-14 MED ORDER — PREDNISONE 20 MG PO TABS
60.0000 mg | ORAL_TABLET | ORAL | Status: AC
  Administered 2017-01-14: 60 mg via ORAL

## 2017-01-14 MED ORDER — DIPHENHYDRAMINE HCL 50 MG/ML IJ SOLN
25.0000 mg | Freq: Once | INTRAMUSCULAR | Status: AC
  Administered 2017-01-14: 25 mg via INTRAVENOUS

## 2017-01-14 MED ORDER — HYDROXYZINE HCL 25 MG PO TABS
25.0000 mg | ORAL_TABLET | Freq: Once | ORAL | Status: AC
  Administered 2017-01-14: 25 mg via ORAL

## 2017-01-14 MED ORDER — PREDNISONE 10 MG PO TABS: ORAL_TABLET | ORAL | 0 refills | Status: DC

## 2017-01-14 MED ORDER — LORAZEPAM 2 MG/ML IJ SOLN
INTRAMUSCULAR | Status: AC
  Filled 2017-01-14: qty 1

## 2017-01-14 MED ORDER — HYDROXYZINE HCL 25 MG PO TABS: 25.0000 mg | ORAL_TABLET | Freq: Three times a day (TID) | ORAL | 0 refills | Status: DC | PRN

## 2017-01-14 MED ORDER — LORAZEPAM 2 MG/ML IJ SOLN
0.5000 mg | Freq: Once | INTRAMUSCULAR | Status: AC
Start: 2017-01-14 — End: 2017-01-14
  Administered 2017-01-14: 0.5 mg via INTRAVENOUS

## 2017-01-14 MED ORDER — PREDNISONE 20 MG PO TABS
ORAL_TABLET | ORAL | Status: AC
  Filled 2017-01-14: qty 3

## 2017-01-14 MED ORDER — HYDROXYZINE HCL 25 MG PO TABS
ORAL_TABLET | ORAL | Status: AC
  Filled 2017-01-14: qty 1

## 2017-01-14 NOTE — ED Provider Notes (Signed)
[facility] Emergency Department Provider Note  ____________________________________________   First MD Initiated Contact with Patient 01/14/17 1703     (approximate)  I have reviewed the triage vital signs and the nursing notes.   HISTORY  Chief Complaint Pruritis  The patient and/or family speak(s) Spanish.  They understand they have the right to the use of a hospital interpreter, however at this time they prefer to speak directly with me in [place].  They know that they can ask for an interpreter at any time.   HPI Mario [name] is a 46 y.o. male who presents with a chief complaint of severe itching and rash.  He states that it has been present forat least 2 weeks.  It got better for a while and now it is back and much worse.  He was seen in this emergency department about one week ago for the itching and because he had missed dialysis as a result of the severe itching.  He could not get to outpatient dialysis on time so he required medical admission for dialysis and was discharged following day.  He was prescribed calcium acetate 3 times daily with meals which is increased from his usual once daily and according to the discharge summary his phosphate binder was increased.  He reports that the symptoms did improve for a little while but now it is severe and he cannot stop scratching himself.  He has numerous excoriated lesions on his arms and legs, some which which are bleeding.  He has no discrete lesions but diffuse erythema throughout the lower extremities and somewhat his upper extremities.  His trunk is not involved.  He describes his symptoms as severe.  He is also describing increased shortness of breath over the last 2 days made worse by exertion and lying supine.  He reports he had his full course of dialysis 3 days ago but did not go today due to the itching.   Past Medical History:  Diagnosis Date  . Chronic combined systolic and diastolic CHF  (congestive heart failure) ([place])   . Cirrhosis ([place])   . Dialysis patient ([place])   . Hypertension   . Renal disorder   . Renal insufficiency     Patient Active Problem List   Diagnosis Date Noted  . Hyperkalemia 11/27/2016  . Acute hyperkalemia 10/28/2016  . Chronic combined systolic and diastolic CHF (congestive heart failure) ([place]) 10/13/2016  . Cirrhosis ([place]) 10/13/2016  . HTN (hypertension) 10/13/2016  . Abdominal pain 08/28/2016  . Pulmonary edema 07/03/2016  . Fluid overload 04/12/2016  . Chest pain 04/11/2016  . Scrotal infection 01/25/2015  . ESRD (end stage renal disease) on dialysis ([place]) 01/25/2015    Past Surgical History:  Procedure Laterality Date  . AV FISTULA PLACEMENT      Prior to Admission medications   Medication Sig Start Date End Date Taking? Authorizing Provider  amLODipine (NORVASC) 10 MG tablet Take 1 tablet (10 mg total) by mouth daily. 08/29/16   Hillary Bow, MD  calcium acetate (PHOSLO) 667 MG tablet Take 3 tablets (2,001 mg total) by mouth 3 (three) times daily with meals. 01/07/17   Henreitta Leber, MD  cholestyramine (QUESTRAN) 4 g packet Take 1 packet (4 g total) by mouth 3 (three) times daily with meals. 01/06/17   Lavonia Drafts, MD  ciprofloxacin (CIPRO) 500 MG tablet Take 1 tablet (500 mg total) by mouth at bedtime. 11/30/16   Loletha Grayer, MD  diphenhydrAMINE (BENADRYL) 25 mg capsule Take  25 mg by mouth every 6 (six) hours as needed.    [provider]  hydrOXYzine (ATARAX/VISTARIL) 25 MG tablet Take 1 tablet (25 mg total) by mouth 3 (three) times daily as needed for itching. 01/14/17   Hinda Kehr, MD  losartan (COZAAR) 100 MG tablet Take 1 tablet (100 mg total) by mouth daily. 10/14/16   Fritzi Mandes, MD  metoprolol tartrate (LOPRESSOR) 25 MG tablet Take 1 tablet (25 mg total) by mouth 2 (two) times daily. 04/16/16   Henreitta Leber, MD  pantoprazole (PROTONIX) 40 MG tablet Take 1 tablet (40 mg total) by mouth daily. 10/14/16    Fritzi Mandes, MD  predniSONE (DELTASONE) 10 MG tablet Take 6 tabs (60 mg) PO x 3 days, then take 4 tabs (40 mg) PO x 3 days, then take 2 tabs (20 mg) PO x 3 days, then take 1 tab (10 mg) PO x 3 days, then take 1/2 tab (5 mg) PO x 4 days. 01/14/17   Hinda Kehr, MD    Allergies Betadine [povidone iodine]  Family History  Problem Relation Age of Onset  . Kidney failure Father   . Diabetes Mother     Social History Social History  Substance Use Topics  . Smoking status: Current Every Day Smoker    Packs/day: 0.25    Years: 25.00    Types: Cigarettes  . Smokeless tobacco: Never Used  . Alcohol use No     Comment: 1/2 bottle tequilla per day    Review of Systems Constitutional: No fever/chills Eyes: No visual changes. ENT: No sore throat. Cardiovascular: Denies chest pain. Respiratory: +shortness of breath For several days Gastrointestinal: No abdominal pain.  No nausea, no vomiting.  No diarrhea.  No constipation. Genitourinary: Negative for dysuria. Musculoskeletal: Negative for neck pain.  Negative for back pain. Integumentary: Severely pruritic erythematous rash mostly on arms and legs, present and worsening over 2 weeks after briefly improving a week ago Neurological: Negative for headaches, focal weakness or numbness.   ____________________________________________   PHYSICAL EXAM:  VITAL SIGNS: ED Triage Vitals  Enc Vitals Group     BP 01/14/17 1650 (!) 154/77     Pulse Rate 01/14/17 1650 89     Resp 01/14/17 1650 18     Temp 01/14/17 1650 98 F (36.7 C)     Temp Source 01/14/17 1650 Oral     SpO2 01/14/17 1650 100 %     Weight 01/14/17 1649 74.4 kg (164 lb)     Height 01/14/17 1649 1.727 m (5\' 8" )     Head Circumference --      Peak Flow --      Pain Score 01/14/17 1649 0     Pain Loc --      Pain Edu? --      Excl. in [place]? --     Constitutional: Alert and oriented. Severe distress due to itching Eyes: Conjunctivae are normal.  Head:  Atraumatic. Nose: No congestion/rhinnorhea. Mouth/Throat: Mucous membranes are moist. Neck: No stridor.  No meningeal signs.   Cardiovascular: Normal rate, regular rhythm. Good peripheral circulation. Grossly normal heart sounds. Respiratory: Normal respiratory effort.  No retractions. Lungs CTAB. Gastrointestinal: Soft and nontender. No distention.  Musculoskeletal: Dialysis access site in LUE.  No lower extremity tenderness nor edema. No gross deformities of extremities. Neurologic:  Normal speech and language. No gross focal neurologic deficits are appreciated.  Skin:  Skin is warm, dry.  Erythematous rash most evident on bilateral lower extremities but  also on upper extremities with numerous excoriated lesions but no evidence of bacterial superinfection.  He has trace pitting edema lower extremities as well.  He has no mucosal involvement and no rashes evident on his anterior posterior torso  ____________________________________________   LABS (all labs ordered are listed, but only abnormal results are displayed)  Labs Reviewed  CBC WITH DIFFERENTIAL/PLATELET - Abnormal; Notable for the following:       Result Value   RBC 3.53 (*)    Hemoglobin 11.9 (*)    HCT 35.8 (*)    MCV 101.4 (*)    RDW 15.5 (*)    Lymphs Abs 0.7 (*)    All other components within normal limits  COMPREHENSIVE METABOLIC PANEL - Abnormal; Notable for the following:    Chloride 98 (*)    Glucose, Bld 113 (*)    BUN 55 (*)    Creatinine, Ser 13.29 (*)    Calcium 8.1 (*)    Total Protein 6.3 (*)    Albumin 3.3 (*)    Alkaline Phosphatase 161 (*)    GFR calc non Af Amer 4 (*)    GFR calc Af Amer 4 (*)    All other components within normal limits  MAGNESIUM - Abnormal; Notable for the following:    Magnesium 2.7 (*)    All other components within normal limits  PHOSPHORUS - Abnormal; Notable for the following:    Phosphorus 5.5 (*)    All other components within normal limits  LIPASE, BLOOD    ____________________________________________  EKG  ED ECG REPORT I, Ben Habermann, the attending physician, personally viewed and interpreted this ECG.  Date: 01/14/2017 EKG Time: 16:58 Rate: 76 Rhythm: normal sinus rhythm QRS Axis: normal Intervals: normal ST/T Wave abnormalities: tall T waves in lead V4, slightly increased amplitutde in V3 Narrative Interpretation: unremarkable  ____________________________________________  RADIOLOGY   Dg Chest Portable 1 View  Result Date: 01/14/2017 CLINICAL DATA:  Acute shortness of breath. EXAM: PORTABLE CHEST 1 VIEW COMPARISON:  11/25/2016 and prior exams FINDINGS: Cardiomegaly noted. There is no evidence of focal airspace disease, pulmonary edema, suspicious pulmonary nodule/mass, pleural effusion, or pneumothorax. No acute bony abnormalities are identified. IMPRESSION: Cardiomegaly without evidence of acute cardiopulmonary disease. Electronically Signed   By: Margarette Canada M.D.   On: 01/14/2017 17:48    ____________________________________________   PROCEDURES  Critical Care performed: No   Procedure(s) performed:   Procedures   ____________________________________________   INITIAL IMPRESSION / ASSESSMENT AND PLAN / ED COURSE  Pertinent labs & imaging results that were available during my care of the patient were reviewed by me and considered in my medical decision making (see chart for details).  I am starting with Benadryl 25 mg IV while evaluating broadly for this chronic renal patient on dialysis who missed dialysis today and is having severe pruritus.  I will check into also ordering the calcium acetate but reportedly that must be taken with meals.  I considered ordering some Ativan to help him calm down but [facility] tells me that is contraindicated based on the fact he is a renal patient.  I will reconsider based on how he does with the current treatment.   Clinical Course as of Jan 14 2018  Wed Jan 14, 2017  1754 No  evidence of acute pulmonary edema. DG Chest Portable 1 View [CF]  1930 Spoke by phone with Dr. Juleen China (nephrology).  We are both puzzled by the intensity of the patient's pruritus based on his lab  results.  However, Dr. Juleen China felt that Atarax would be a good idea, and he also stated that he frequently uses Ativan for his patients in spite of the renal clearance of benzos.  I suggested a course of prednisone which he also thought sounded reasonable.  I am administering Ativan 0.5 mg IV, Atarax 25 mg PO, and prednisone 60 mg PO.  Dr. Juleen China will make sure that the patient has follow up for dialysis but does not need emergent dialysis at this time.  [CF]  2019 The patient is much more comfortable at this time and has finally been able to get some rest.  I will discharge him with Atarax and prednisone taper and I provided information for the [facility] as well as for follow-up with Dr. Holley Raring.  [CF]    Clinical Course User Index [CF] Hinda Kehr, MD    ____________________________________________  FINAL CLINICAL IMPRESSION(S) / ED DIAGNOSES  Final diagnoses:  Generalized rash  Pruritus  End stage renal disease on dialysis [facility])     MEDICATIONS GIVEN DURING THIS VISIT:  Medications  diphenhydrAMINE (BENADRYL) injection 25 mg (25 mg Intravenous Given 01/14/17 1718)  hydrOXYzine (ATARAX/VISTARIL) tablet 25 mg (25 mg Oral Given 01/14/17 1936)  predniSONE (DELTASONE) tablet 60 mg (60 mg Oral Given 01/14/17 1936)  LORazepam (ATIVAN) injection 0.5 mg (0.5 mg Intravenous Given 01/14/17 1937)     NEW OUTPATIENT MEDICATIONS STARTED DURING THIS VISIT:  New Prescriptions   HYDROXYZINE (ATARAX/VISTARIL) 25 MG TABLET    Take 1 tablet (25 mg total) by mouth 3 (three) times daily as needed for itching.   PREDNISONE (DELTASONE) 10 MG TABLET    Take 6 tabs (60 mg) PO x 3 days, then take 4 tabs (40 mg) PO x 3 days, then take 2 tabs (20 mg) PO x 3 days, then take 1 tab (10 mg) PO x 3 days, then  take 1/2 tab (5 mg) PO x 4 days.    Modified Medications   No medications on file    Discontinued Medications   PREDNISONE (DELTASONE) 5 MG TABLET    4 tabs po day 1; 3 tabs po day2,3; 2 tab po day4,5; 1 tab po day6,7     Note:  This document was prepared using Dragon voice recognition software and may include unintentional dictation errors.    Hinda Kehr, MD 01/14/17 2019

## 2017-01-14 NOTE — ED Notes (Signed)
Pt is intense itching all over body for 2 weeks.  Pt seen here 2 weeks and started on cream/lotion, but not no relief.  Pt missed dialysis today.  Last treatment was 4 days ago.  md at bedside.

## 2017-01-14 NOTE — ED Notes (Signed)
Report from Amy, RN at this time.

## 2017-01-14 NOTE — ED Notes (Signed)
Good RX coupons printed for pt by this RN

## 2017-01-14 NOTE — ED Notes (Signed)
Report off to [employer]

## 2017-01-14 NOTE — ED Notes (Signed)
Itching improved, but still scratching.  md aware.  pcxr done now

## 2017-01-14 NOTE — ED Notes (Signed)
Iv started  meds given.   

## 2017-01-14 NOTE — ED Triage Notes (Signed)
Pt arrives with complaints of itching, pt clawing at skin, redness noted to skin, states he has been itching for 2 weeks, dialysis pt, last tx was Saturday, denies any pain, states hx of liver problems, interpeter used

## 2017-01-14 NOTE — ED Notes (Signed)

## 2017-02-25 ENCOUNTER — Emergency Department
Admission: EM | Admit: 2017-02-25 | Discharge: 2017-02-25 | Disposition: A | Discharge status: Home / Self Care | Payer: Self-pay | Attending: Emergency Medicine | Admitting: Emergency Medicine

## 2017-02-25 ENCOUNTER — Emergency Department: Payer: Self-pay

## 2017-02-25 DIAGNOSIS — I132 Hypertensive heart and chronic kidney disease with heart failure and with stage 5 chronic kidney disease, or end stage renal disease: Secondary | ICD-10-CM | POA: Insufficient documentation

## 2017-02-25 DIAGNOSIS — N186 End stage renal disease: Secondary | ICD-10-CM | POA: Insufficient documentation

## 2017-02-25 DIAGNOSIS — Z79899 Other long term (current) drug therapy: Secondary | ICD-10-CM | POA: Insufficient documentation

## 2017-02-25 DIAGNOSIS — M545 Low back pain, unspecified: Secondary | ICD-10-CM

## 2017-02-25 DIAGNOSIS — I5042 Chronic combined systolic (congestive) and diastolic (congestive) heart failure: Secondary | ICD-10-CM | POA: Insufficient documentation

## 2017-02-25 DIAGNOSIS — Z992 Dependence on renal dialysis: Secondary | ICD-10-CM | POA: Insufficient documentation

## 2017-02-25 DIAGNOSIS — K4090 Unilateral inguinal hernia, without obstruction or gangrene, not specified as recurrent: Secondary | ICD-10-CM | POA: Insufficient documentation

## 2017-02-25 DIAGNOSIS — R1110 Vomiting, unspecified: Secondary | ICD-10-CM | POA: Insufficient documentation

## 2017-02-25 DIAGNOSIS — R1032 Left lower quadrant pain: Secondary | ICD-10-CM | POA: Insufficient documentation

## 2017-02-25 LAB — COMPREHENSIVE METABOLIC PANEL
ALK PHOS: 199 U/L — AB (ref 38–126)
ALT: 23 U/L (ref 17–63)
ANION GAP: 14 (ref 5–15)
AST: 28 U/L (ref 15–41)
Albumin: 3.6 g/dL (ref 3.5–5.0)
BUN: 33 mg/dL — ABNORMAL HIGH (ref 6–20)
CALCIUM: 9 mg/dL (ref 8.9–10.3)
CHLORIDE: 99 mmol/L — AB (ref 101–111)
CO2: 23 mmol/L (ref 22–32)
Creatinine, Ser: 8.09 mg/dL — ABNORMAL HIGH (ref 0.61–1.24)
GFR calc non Af Amer: 7 mL/min — ABNORMAL LOW (ref >60)
GFR, EST AFRICAN AMERICAN: 8 mL/min — AB (ref >60)
Glucose, Bld: 87 mg/dL (ref 65–99)
POTASSIUM: 5.3 mmol/L — AB (ref 3.5–5.1)
SODIUM: 136 mmol/L (ref 135–145)
Total Bilirubin: 1.6 mg/dL — ABNORMAL HIGH (ref 0.3–1.2)
Total Protein: 6.9 g/dL (ref 6.5–8.1)

## 2017-02-25 LAB — CBC WITH DIFFERENTIAL/PLATELET
BASOS PCT: 1 %
Basophils Absolute: 0 10*3/uL (ref 0–0.1)
EOS ABS: 0.1 10*3/uL (ref 0–0.7)
EOS PCT: 1 %
HCT: 38.4 % — ABNORMAL LOW (ref 40.0–52.0)
Hemoglobin: 12.7 g/dL — ABNORMAL LOW (ref 13.0–18.0)
Lymphocytes Relative: 11 %
Lymphs Abs: 0.6 10*3/uL — ABNORMAL LOW (ref 1.0–3.6)
MCH: 32.4 pg (ref 26.0–34.0)
MCHC: 33 g/dL (ref 32.0–36.0)
MCV: 98.1 fL (ref 80.0–100.0)
MONO ABS: 0.6 10*3/uL (ref 0.2–1.0)
MONOS PCT: 10 %
Neutro Abs: 4.5 10*3/uL (ref 1.4–6.5)
Neutrophils Relative %: 77 %
PLATELETS: 149 10*3/uL — AB (ref 150–440)
RBC: 3.92 MIL/uL — ABNORMAL LOW (ref 4.40–5.90)
RDW: 15.6 % — AB (ref 11.5–14.5)
WBC: 5.8 10*3/uL (ref 3.8–10.6)

## 2017-02-25 LAB — PROTIME-INR
INR: 1.19
PROTHROMBIN TIME: 15.2 s (ref 11.4–15.2)

## 2017-02-25 LAB — ETHANOL: Alcohol, Ethyl (B): <5 mg/dL (ref <5)

## 2017-02-25 LAB — LIPASE, BLOOD: LIPASE: 38 U/L (ref 11–51)

## 2017-02-25 MED ORDER — ACETAMINOPHEN 325 MG PO TABS: 650.0000 mg | ORAL_TABLET | Freq: Once | ORAL | Status: DC

## 2017-02-25 MED ORDER — ACETAMINOPHEN 325 MG PO TABS
ORAL_TABLET | ORAL | Status: AC
  Filled 2017-02-25: qty 2

## 2017-02-25 NOTE — ED Provider Notes (Addendum)
[facility] Emergency Department Provider Note  ____________________________________________   I have reviewed the triage vital signs and the nursing notes.   HISTORY  Chief Complaint Back Pain and Emesis    HPI Mario [name] is a 46 y.o. 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 He is not having abdominal pain. He states it hurts in his left inguinal region. No testicular pain or swelling. Does not make urine. Is up-to-date on his dialysis he states. States that he did have some nausea and vomited once. He denies any bloody emesis or bright red blood per rectum. Normal bowel movements most recently today. The pain comes and goes worse when he moves around the wrong way. It's in the left inguinal region. No radiation.     Past Medical History:  Diagnosis Date  . Chronic combined systolic and diastolic CHF (congestive heart failure) ([place])   . Cirrhosis ([place])   . Dialysis patient ([place])   . Hypertension   . Renal disorder   . Renal insufficiency     Patient Active Problem List   Diagnosis Date Noted  . Hyperkalemia 11/27/2016  . Acute hyperkalemia 10/28/2016  . Chronic combined systolic and diastolic CHF (congestive heart failure) ([place]) 10/13/2016  . Cirrhosis ([place]) 10/13/2016  . HTN (hypertension) 10/13/2016  . Abdominal pain 08/28/2016  . Pulmonary edema 07/03/2016  . Fluid overload 04/12/2016  . Chest pain 04/11/2016  . Scrotal infection 01/25/2015  . ESRD (end stage renal disease) on dialysis ([place]) 01/25/2015    Past Surgical History:  Procedure Laterality Date  . AV FISTULA PLACEMENT      Prior to Admission medications   Medication Sig Start Date End Date Taking? Authorizing Provider  amLODipine (NORVASC) 10 MG tablet Take 1 tablet (10 mg total) by mouth daily. 08/29/16  Yes  Sudini, Alveta Heimlich, MD  calcium acetate (PHOSLO) 667 MG tablet Take 3 tablets (2,001 mg total) by mouth 3 (three) times daily with meals. 01/07/17  Yes Sainani, Belia Heman, MD  ferric citrate (AURYXIA) 1 GM 210 MG(Fe) tablet Take 420 mg by mouth 3 (three) times daily with meals.   Yes [provider]  metoprolol tartrate (LOPRESSOR) 25 MG tablet Take 1 tablet (25 mg total) by mouth 2 (two) times daily. 04/16/16  Yes Sainani, Belia Heman, MD  pantoprazole (PROTONIX) 40 MG tablet Take 1 tablet (40 mg total) by mouth daily. 10/14/16  Yes Fritzi Mandes, MD  cholestyramine Lucrezia Starch) 4 g packet Take 1 packet (4 g total) by mouth 3 (three) times daily with meals. 01/06/17   Lavonia Drafts, MD  ciprofloxacin (CIPRO) 500 MG tablet Take 1 tablet (500 mg total) by mouth at bedtime. Patient not taking: Reported on 02/25/2017 11/30/16   Loletha Grayer, MD  hydrOXYzine (ATARAX/VISTARIL) 25 MG tablet Take 1 tablet (25 mg total) by mouth 3 (three) times daily as needed for itching. 01/14/17   Hinda Kehr, MD  losartan (COZAAR) 100 MG tablet Take 1 tablet (100 mg total) by mouth daily. Patient not taking: Reported on 02/25/2017 10/14/16   Fritzi Mandes, MD  predniSONE (DELTASONE) 10 MG tablet Take 6 tabs (60 mg) PO x 3 days, then take 4 tabs (40 mg) PO x 3 days, then take 2 tabs (20 mg) PO x 3 days, then take 1 tab (10 mg) PO x 3 days, then take 1/2 tab (5 mg) PO  x 4 days. Patient not taking: Reported on 02/25/2017 01/14/17   Hinda Kehr, MD    Allergies Betadine [povidone iodine]  Family History  Problem Relation Age of Onset  . Kidney failure Father   . Diabetes Mother     Social History Social History  Substance Use Topics  . Smoking status: Current Every Day Smoker    Packs/day: 0.25    Years: 25.00    Types: Cigarettes  . Smokeless tobacco: Never Used  . Alcohol use No     Comment: 1/2 bottle tequilla per day    Review of Systems Constitutional: No fever/chills Eyes: No visual changes. ENT: No sore  throat. No stiff neck no neck pain Cardiovascular: Denies chest pain. Respiratory: Denies shortness of breath. Gastrointestinal:   Did have some vomiting.  No diarrhea.  No constipation. Genitourinary: Negative for dysuria. Musculoskeletal: Negative lower extremity swelling Skin: Negative for rash. Neurological: Negative for severe headaches, focal weakness or numbness.   ____________________________________________   PHYSICAL EXAM:  VITAL SIGNS: ED Triage Vitals [02/25/17 0848]  Enc Vitals Group     BP (!) 162/80     Pulse Rate 65     Resp 17     Temp 97.6 F (36.4 C)     Temp Source Oral     SpO2 100 %     Weight 165 lb (74.8 kg)     Height 5\' 6"  (1.676 m)     Head Circumference      Peak Flow      Pain Score 7     Pain Loc      Pain Edu?      Excl. in [place]?     Constitutional: Alert and oriented. Well appearing and in no acute distress. Eyes: Conjunctivae are normal Head: Atraumatic HEENT: No congestion/rhinnorhea. Mucous membranes are moist.  Oropharynx non-erythematous Neck:   Nontender with no meningismus, no masses, no stridor Cardiovascular: Normal rate, regular rhythm. Grossly normal heart sounds.  Good peripheral circulation. Respiratory: Normal respiratory effort.  No retractions. Lungs CTAB. Abdominal: Soft and nontender. No distention. No guarding no rebound Back:  There is no focal tenderness or step off.  there is no midline tenderness there are no lesions noted. there is no CVA tenderness GU: Normal external male genitalia, no tenderness to palpation of the testicles or no masses no lesions, penis is normal. There is very slight discomfort to palpation of the inguinal region no erythema no redness no lymphadenopathy no hernia no bulge, very distractible exam Musculoskeletal: No lower extremity tenderness, no upper extremity tenderness. No joint effusions, no DVT signs strong distal pulses no edema Neurologic:  Normal speech and language. No gross focal  neurologic deficits are appreciated.  Skin:  Skin is warm, dry and intact. No rash noted. Psychiatric: Mood and affect are normal. Speech and behavior are normal.  ____________________________________________   LABS (all labs ordered are listed, but only abnormal results are displayed)  Labs Reviewed  CBC WITH DIFFERENTIAL/PLATELET - Abnormal; Notable for the following:       Result Value   RBC 3.92 (*)    Hemoglobin 12.7 (*)    HCT 38.4 (*)    RDW 15.6 (*)    Platelets 149 (*)    Lymphs Abs 0.6 (*)    All other components within normal limits  COMPREHENSIVE METABOLIC PANEL - Abnormal; Notable for the following:    Potassium 5.3 (*)    Chloride 99 (*)    BUN 33 (*)  Creatinine, Ser 8.09 (*)    Alkaline Phosphatase 199 (*)    Total Bilirubin 1.6 (*)    GFR calc non Af Amer 7 (*)    GFR calc Af Amer 8 (*)    All other components within normal limits  ETHANOL  LIPASE, BLOOD  PROTIME-INR   ____________________________________________  EKG  I personally interpreted any EKGs ordered by me or triage  ____________________________________________  RADIOLOGY  I reviewed any imaging ordered by me or triage that were performed during my shift and, if possible, patient and/or family made aware of any abnormal findings. ____________________________________________   PROCEDURES  Procedure(s) performed: None  Procedures  Critical Care performed: None  ____________________________________________   INITIAL IMPRESSION / ASSESSMENT AND PLAN / ED COURSE  Pertinent labs & imaging results that were available during my care of the patient were reviewed by me and considered in my medical decision making (see chart for details).  Administration with multiple different histories of recurrent abdominal pain, presents today with no abdominal pain today but discomfort is inguinal region. CT to that area as reassuring suffer a small fat-containing hernia. I suppose that could be  causing his symptoms. Patient is very comfortable here, he would like to eat and drink. Vital signs and blood work are very reassuring given his baseline. Nothing to suggest appendicitis with left inguinal pain and nothing to suggest SBP with no abdominal tenderness, and no abdominal pain. No fever. We will see if he can tolerate by mouth and if he can we'll have him closely follow up for further care of this fat-containing tiny inguinal hernia.----------------------------------------- 12:31 PM on 02/25/2017 -----------------------------------------  Patient remains asymptomatic he slept for most the time of this year, he has a very benign exam with no abdominal tenderness and I can't reduce his inguinal pain which he states is gone at this time in any event. He elected try to eat and see if things go home. Return precautions and follow-up given and understood. He is also complaining of some low back pain which seems to go around to the 5 neurologically intact with no evidence of cauda equina syndrome or numbness or weakness. He does have chronic back pain he states.   ____________________________________________   FINAL CLINICAL IMPRESSION(S) / ED DIAGNOSES  Final diagnoses:  None      This chart was dictated using voice recognition software.  Despite best efforts to proofread,  errors can occur which can change meaning.      Schuyler Amor, MD 02/25/17 1156    Schuyler Amor, MD 02/25/17 1231    Schuyler Amor, MD 02/25/17 [phone]

## 2017-02-25 NOTE — ED Notes (Signed)
Pt c/o pain, spoke with Dr. Burlene Arnt, ordered Tylenol 650mg . Went to patient's room, declined Tylenol.  Informed Dr. Burlene Arnt, pt refused meds.  Pt will be discharged. Interpreter requested for DC.

## 2017-02-25 NOTE — ED Notes (Signed)
Blue top recollected

## 2017-02-25 NOTE — ED Triage Notes (Signed)
Pt c/o right lower back pain and left groin pain with N/V for the past 3 days.

## 2017-03-04 ENCOUNTER — Ambulatory Visit: Payer: Self-pay | Admitting: Surgery

## 2017-03-04 ENCOUNTER — Encounter: Payer: Self-pay | Admitting: Surgery

## 2017-03-04 NOTE — Progress Notes (Signed)
Patient was not seen at the office today. This encounter was opened in error.

## 2017-06-04 ENCOUNTER — Encounter: Payer: Self-pay | Admitting: Emergency Medicine

## 2017-06-04 ENCOUNTER — Emergency Department
Admission: EM | Admit: 2017-06-04 | Discharge: 2017-06-04 | Disposition: A | Discharge status: Home / Self Care | Payer: Self-pay | Attending: Emergency Medicine | Admitting: Emergency Medicine

## 2017-06-04 ENCOUNTER — Emergency Department: Payer: Self-pay

## 2017-06-04 DIAGNOSIS — I132 Hypertensive heart and chronic kidney disease with heart failure and with stage 5 chronic kidney disease, or end stage renal disease: Secondary | ICD-10-CM | POA: Insufficient documentation

## 2017-06-04 DIAGNOSIS — Z992 Dependence on renal dialysis: Secondary | ICD-10-CM | POA: Insufficient documentation

## 2017-06-04 DIAGNOSIS — R112 Nausea with vomiting, unspecified: Secondary | ICD-10-CM | POA: Insufficient documentation

## 2017-06-04 DIAGNOSIS — F17210 Nicotine dependence, cigarettes, uncomplicated: Secondary | ICD-10-CM | POA: Insufficient documentation

## 2017-06-04 DIAGNOSIS — N186 End stage renal disease: Secondary | ICD-10-CM | POA: Insufficient documentation

## 2017-06-04 DIAGNOSIS — R1084 Generalized abdominal pain: Secondary | ICD-10-CM | POA: Insufficient documentation

## 2017-06-04 DIAGNOSIS — I5042 Chronic combined systolic (congestive) and diastolic (congestive) heart failure: Secondary | ICD-10-CM | POA: Insufficient documentation

## 2017-06-04 DIAGNOSIS — E875 Hyperkalemia: Secondary | ICD-10-CM | POA: Insufficient documentation

## 2017-06-04 LAB — COMPREHENSIVE METABOLIC PANEL
ALBUMIN: 3.6 g/dL (ref 3.5–5.0)
ALK PHOS: 208 U/L — AB (ref 38–126)
ALT: 15 U/L — ABNORMAL LOW (ref 17–63)
ANION GAP: 15 (ref 5–15)
AST: 24 U/L (ref 15–41)
BUN: 41 mg/dL — ABNORMAL HIGH (ref 6–20)
CO2: 23 mmol/L (ref 22–32)
Calcium: 8.7 mg/dL — ABNORMAL LOW (ref 8.9–10.3)
Chloride: 98 mmol/L — ABNORMAL LOW (ref 101–111)
Creatinine, Ser: 9.56 mg/dL — ABNORMAL HIGH (ref 0.61–1.24)
GFR calc Af Amer: 7 mL/min — ABNORMAL LOW (ref >60)
GFR calc non Af Amer: 6 mL/min — ABNORMAL LOW (ref >60)
GLUCOSE: 84 mg/dL (ref 65–99)
POTASSIUM: 5.6 mmol/L — AB (ref 3.5–5.1)
SODIUM: 136 mmol/L (ref 135–145)
Total Bilirubin: 1 mg/dL (ref 0.3–1.2)
Total Protein: 7.2 g/dL (ref 6.5–8.1)

## 2017-06-04 LAB — CBC
HEMATOCRIT: 39.1 % — AB (ref 40.0–52.0)
HEMOGLOBIN: 12.9 g/dL — AB (ref 13.0–18.0)
MCH: 31.2 pg (ref 26.0–34.0)
MCHC: 32.9 g/dL (ref 32.0–36.0)
MCV: 94.8 fL (ref 80.0–100.0)
Platelets: 120 10*3/uL — ABNORMAL LOW (ref 150–440)
RBC: 4.12 MIL/uL — ABNORMAL LOW (ref 4.40–5.90)
RDW: 14.6 % — ABNORMAL HIGH (ref 11.5–14.5)
WBC: 4.2 10*3/uL (ref 3.8–10.6)

## 2017-06-04 LAB — LIPASE, BLOOD: Lipase: 34 U/L (ref 11–51)

## 2017-06-04 LAB — INFLUENZA PANEL BY PCR (TYPE A & B)
INFLAPCR: NEGATIVE
INFLBPCR: NEGATIVE

## 2017-06-04 LAB — TROPONIN I: Troponin I: <0.03 ng/mL (ref <0.03)

## 2017-06-04 MED ORDER — SODIUM POLYSTYRENE SULFONATE 15 GM/60ML PO SUSP
30.0000 g | Freq: Once | ORAL | Status: AC
Start: 2017-06-04 — End: 2017-06-04
  Administered 2017-06-04: 30 g via ORAL
  Filled 2017-06-04: qty 120

## 2017-06-04 MED ORDER — ONDANSETRON HCL 4 MG/2ML IJ SOLN
4.0000 mg | Freq: Once | INTRAMUSCULAR | Status: AC
  Administered 2017-06-04: 4 mg via INTRAVENOUS
  Filled 2017-06-04: qty 2

## 2017-06-04 MED ORDER — IOPAMIDOL (ISOVUE-300) INJECTION 61%
100.0000 mL | Freq: Once | INTRAVENOUS | Status: AC | PRN
  Administered 2017-06-04: 100 mL via INTRAVENOUS

## 2017-06-04 MED ORDER — ONDANSETRON 4 MG PO TBDP: 4.0000 mg | ORAL_TABLET | Freq: Three times a day (TID) | ORAL | 0 refills | Status: DC | PRN

## 2017-06-04 MED ORDER — HYDROMORPHONE HCL 1 MG/ML IJ SOLN
1.0000 mg | Freq: Once | INTRAMUSCULAR | Status: AC
  Administered 2017-06-04: 1 mg via INTRAVENOUS
  Filled 2017-06-04: qty 1

## 2017-06-04 MED ORDER — HYDRALAZINE HCL 20 MG/ML IJ SOLN
10.0000 mg | Freq: Once | INTRAMUSCULAR | Status: AC
  Administered 2017-06-04: 10 mg via INTRAVENOUS
  Filled 2017-06-04: qty 1

## 2017-06-04 MED ORDER — SODIUM POLYSTYRENE SULFONATE 15 GM/60ML PO SUSP: 30.0000 g | Freq: Once | ORAL | 0 refills | Status: AC

## 2017-06-04 MED ORDER — IOPAMIDOL (ISOVUE-300) INJECTION 61%
30.0000 mL | Freq: Once | INTRAVENOUS | Status: AC | PRN
  Administered 2017-06-04: 30 mL via ORAL

## 2017-06-04 MED ORDER — HYDROMORPHONE HCL 1 MG/ML IJ SOLN
0.5000 mg | Freq: Once | INTRAMUSCULAR | Status: AC
  Administered 2017-06-04: 0.5 mg via INTRAVENOUS
  Filled 2017-06-04: qty 1

## 2017-06-04 NOTE — ED Notes (Signed)
Discharge teaching done with interpreter on stick. Pt verbalizes d/c follow up and rx. Pt in NAD at time of d/c, pt A&Ox4, ambulatory,. Pt riding bus home, pt placed in wheelchair and wheeled to courtesy vehicle in ED drop off. Pt will be taken to bus stop by courtesy vehicle.

## 2017-06-04 NOTE — ED Provider Notes (Addendum)
[facility] Emergency Department Provider Note  ____________________________________________  Time seen: Approximately 10:47 AM  I have reviewed the triage vital signs and the nursing notes.   HISTORY  Chief Complaint Weakness and Emesis    HPI Mario [name] is a 46 y.o. male with a history of ESRD on HD, cirrhosis, HTN, presenting for nausea and vomiting with diffuse abdominal pain. The patient reports that since Saturday, he has been unable to tolerate any liquid or solid by mouth. He subjectively felt warm overnight, but did not take his temperature. He has had diffuse abdominal pain but most prominent in the epigastrium. He has had small but normally movements, including 1 this morning. He does not know if he has been passing gas. He has not tried anything for his symptoms. The patient underwent completed dialysis on Tuesday but was unable to go today due to his symptoms. He denies any chest pain, shortness of breath, no longer makes urine.   Past Medical History:  Diagnosis Date  . Abdominal pain 08/28/2016  . Acute hyperkalemia 10/28/2016  . Chest pain 04/11/2016  . Chronic combined systolic and diastolic CHF (congestive heart failure) ([place])   . Cirrhosis ([place])   . Dialysis patient ([place])   . ESRD (end stage renal disease) on dialysis ([place]) 01/25/2015  . Fluid overload 04/12/2016  . HTN (hypertension) 10/13/2016  . Hyperkalemia 11/27/2016  . Hypertension   . Pulmonary edema 07/03/2016  . Renal disorder   . Renal insufficiency   . Scrotal infection 01/25/2015    Patient Active Problem List   Diagnosis Date Noted  . Erroneous encounter - disregard 03/04/2017  . Hyperkalemia 11/27/2016  . Acute hyperkalemia 10/28/2016  . Chronic combined systolic and diastolic CHF (congestive heart failure) ([place]) 10/13/2016  . Cirrhosis ([place]) 10/13/2016  . HTN (hypertension) 10/13/2016  . Abdominal pain 08/28/2016  . Pulmonary edema 07/03/2016  . Fluid overload  04/12/2016  . Chest pain 04/11/2016  . Scrotal infection 01/25/2015  . ESRD (end stage renal disease) on dialysis ([place]) 01/25/2015    Past Surgical History:  Procedure Laterality Date  . AV FISTULA PLACEMENT      Current Outpatient Rx  . Order #: [id number] Class: Normal  . Order #: [id number] Class: No Print  . Order #: [id number] Class: Historical Med  . Order #: [id number] Class: Normal  . Order #: [id number] Class: Print  . Order #: [id number] Class: Normal  . Order #: [id number] Class: Print  . Order #: [id number] Class: Print  . Order #: [id number] Class: Print  . Order #: [id number] Class: Historical Med  . Order #: [id number] Class: Print  . Order #: [id number] Class: Print    Allergies Betadine [povidone iodine]  Family History  Problem Relation Age of Onset  . Kidney failure Father   . Diabetes Mother     Social History Social History  Substance Use Topics  . Smoking status: Current Every Day Smoker    Packs/day: 0.25    Years: 25.00    Types: Cigarettes  . Smokeless tobacco: Never Used  . Alcohol use No     Comment: 1/2 bottle tequilla per day    Review of Systems Constitutional: No fever/chills. No lightheadedness or syncope. Eyes: No visual changes. ENT: No sore throat. No congestion or rhinorrhea. Cardiovascular: Denies chest pain. Denies palpitations. Respiratory: Denies shortness of breath.  No cough. Gastrointestinal: Positive diffuse abdominal pain, worst in the epigastrium.  +nausea, +vomiting.  No diarrhea.  No constipation. Genitourinary: No longer makes urine. Musculoskeletal: Negative for back pain. Skin: Negative for  rash. Neurological: Negative for headaches. No focal numbness, tingling or weakness.     ____________________________________________   PHYSICAL EXAM:  VITAL SIGNS: ED Triage Vitals  Enc Vitals Group     BP 06/04/17 1032 (!) 164/71     Pulse Rate 06/04/17 1032 71     Resp 06/04/17 1032 20     Temp 06/04/17 1032 97.8 F (36.6 C)      Temp Source 06/04/17 1032 Oral     SpO2 06/04/17 1032 99 %     Weight 06/04/17 1032 150 lb (68 kg)     Height 06/04/17 1032 5\' 1"  (1.549 m)     Head Circumference --      Peak Flow --      Pain Score 06/04/17 1031 10     Pain Loc --      Pain Edu? --      Excl. in [place]? --     Constitutional: Alert and oriented. Chronically ill appearing and uncomfortable appearing but nontoxic. Answers questions appropriately. Eyes: Conjunctivae are normal.  EOMI. No scleral icterus. Head: Atraumatic. Nose: No congestion/rhinnorhea. Mouth/Throat: Mucous membranes are dry.  Neck: No stridor.  Supple.  Positive JVD. No meningismus. Cardiovascular: Normal rate, regular rhythm. Positive holosystolic murmur without rubs or gallops.  Respiratory: Normal respiratory effort.  No accessory muscle use or retractions. Lungs CTAB.  No wheezes, rales or ronchi. Gastrointestinal: Soft, and distended with fluid wave. The patient has tenderness to palpation in the epigastrium. Negative Murphy sign..  No guarding or rebound.  No peritoneal signs. Musculoskeletal: No LE edema.  Neurologic:  A&Ox3.  Speech is clear.  Face and smile are symmetric.  EOMI.  Moves all extremities well. Skin:  Skin is warm, dry and intact. No rash noted. Psychiatric: Mood and affect are normal. Speech and behavior are normal.  Normal judgement  ____________________________________________   LABS (all labs ordered are listed, but only abnormal results are displayed)  Labs Reviewed  COMPREHENSIVE METABOLIC PANEL - Abnormal; Notable for the following:       Result Value   Potassium 5.6 (*)    Chloride 98 (*)    BUN 41 (*)    Creatinine, Ser 9.56 (*)    Calcium 8.7 (*)    ALT 15 (*)    Alkaline Phosphatase 208 (*)    GFR calc non Af Amer 6 (*)    GFR calc Af Amer 7 (*)    All other components within normal limits  CBC - Abnormal; Notable for the following:    RBC 4.12 (*)    Hemoglobin 12.9 (*)    HCT 39.1 (*)    RDW 14.6 (*)     Platelets 120 (*)    All other components within normal limits  LIPASE, BLOOD  TROPONIN I  INFLUENZA PANEL BY PCR (TYPE A & B)   ____________________________________________  EKG  ED ECG REPORT I, Eula Listen, the attending physician, personally viewed and interpreted this ECG.   Date: 06/04/2017  EKG Time: 1054  Rate: 60  Rhythm: normal sinus rhythm  Axis: normal  Intervals:none  ST&T Change: Nonspecific T-wave inversions in V1. No STEMI.  ____________________________________________  RADIOLOGY  Ct Abdomen Pelvis W Contrast  Result Date: 06/04/2017 CLINICAL DATA:  Acute diffuse abdominal pain. EXAM: CT ABDOMEN AND PELVIS WITH CONTRAST TECHNIQUE: Multidetector CT imaging of the abdomen and pelvis was performed using the standard protocol following bolus administration of intravenous contrast. CONTRAST:  143mL ISOVUE-300 IOPAMIDOL (ISOVUE-300) INJECTION 61% COMPARISON:  CT scan  of February 25, 2017. FINDINGS: Lower chest: No acute abnormality. Hepatobiliary: No gallstones are noted. Severe hepatic cirrhosis is noted. Periesophageal varices are noted consistent with portal hypertension. Pancreas: Unremarkable. No pancreatic ductal dilatation or surrounding inflammatory changes. Spleen: Normal in size without focal abnormality. Adrenals/Urinary Tract: Adrenal glands are unremarkable. Severe bilateral renal atrophy is noted consistent with end-stage renal disease. Stable right renal cysts are noted. No hydronephrosis or renal obstruction is noted. Urinary bladder is decompressed. Stomach/Bowel: Stomach is within normal limits. Appendix appears normal. No evidence of bowel wall thickening, distention, or inflammatory changes. Vascular/Lymphatic: Aortic atherosclerosis. No enlarged abdominal or pelvic lymph nodes. Reproductive: Prostate is unremarkable. Other: Moderate ascites is noted throughout the abdomen and pelvis. No hernia is noted. Musculoskeletal: No acute or significant osseous  findings. IMPRESSION: Severe hepatic cirrhosis is noted with peer esophageal varices consistent with portal hypertension. Moderate ascites is noted as well. Severe bilateral renal atrophy is noted consistent with history of end-stage renal disease. Aortic atherosclerosis. Electronically Signed   By: Marijo Conception, M.D.   On: 06/04/2017 14:39    ____________________________________________   PROCEDURES  Procedure(s) performed: None  Procedures  Critical Care performed: No ____________________________________________   INITIAL IMPRESSION / ASSESSMENT AND PLAN / ED COURSE  Pertinent labs & imaging results that were available during my care of the patient were reviewed by me and considered in my medical decision making (see chart for details).  46 y.o. male with a history of ESRD on HD, cirrhosis, presenting with nausea and vomiting, inability to tolerate anything by mouth for the past 6 days. Overall, the patient is hemodynamically stable and afebrile, but I am concerned about his symptoms. Will check him for electrolyte abnormality, uremia, we'll also get a troponin. He will be checked for influenza. At this time, his abdominal examination is not suggestive of spontaneous bacterial peritonitis, nor an acute intra-abdominal pathology including gallbladder disease, appendicitis or diverticulitis but we will monitor his laboratory studies and clinical exam. At this time, imaging is not indicated. Plan reevaluation for final disposition.  ----------------------------------------- 1:20 PM on 06/04/2017 -----------------------------------------  The patient continues to have some discomfort in the abdomen although he is able to tolerate liquid at this time. I have discussed discharge with him but he states that his pain is too severe. I have reexamined his abdomen, and again he has no focality in his pain. At this time, I'll plan to get a CT of the abdomen for further evaluation. I will re-dose  the patient with medication for pain, and reevaluate him for final disposition.  ____________________________________________  FINAL CLINICAL IMPRESSION(S) / ED DIAGNOSES  Final diagnoses:  Generalized abdominal pain  Nausea and vomiting, intractability of vomiting not specified, unspecified vomiting type  Hyperkalemia    Clinical Course as of Jun 04 1499  Thu Jun 04, 2017  1222 At this time, the patient states his nausea has resolved but he continues to have abdominal pain. He reports that he has chronic abdominal pain and is unclear whether this pain is worse than usual for the same. I will treat him with an additional dose of Dilaudid. His laboratory studies do show a mild hyperkalemia which is consistent with not having had dialysis since Tuesday. He has no hyperkalemic changes on his EKG. If I'm able to control the patient's pain and vomiting, he will be discharged to have dialysis. If not he will get his dialysis inpatient. Plan reevaluation for final disposition per  [AN]    Clinical Course User Index [  AN] Eula Listen, MD   ----------------------------------------- 2:58 PM on 06/04/2017 -----------------------------------------  The patient's clinical course has been reassuring in the emergency department. He has no elevation in his white blood cell count, and his CT scan does not show any acute intra-abdominal pathology although he has multiple chronic abnormalities which are grossly unchanged. His influenza testing is negative. He has been able to drink liquids without vomiting. His pain has improved although he is continues have some pain; but the patient states he has abdominal pain every day.  The patient is hypertensive here and hyperkalemic, likely from not having dialysis. He does not have any EKG changes from hyperkalemia. I have a call out to Dr. Holley Raring to make an outpatient dialysis plan as the patient is safe for discharge from the emergency department. I've been  able to secure dialysis spot with the patient at 2:45 PM and his regular dialysis clinic tomorrow. He has received hydralazine and Kayexalate in the emergency department alcohol home with instructions to take a second dose of Kayexalate tomorrow morning at 7 AM. Return precautions as well as follow-up instructions have been discussed with the patient.   NEW MEDICATIONS STARTED DURING THIS VISIT:  New Prescriptions   ONDANSETRON (ZOFRAN ODT) 4 MG DISINTEGRATING TABLET    Take 1 tablet (4 mg total) by mouth every 8 (eight) hours as needed for nausea or vomiting.      Eula Listen, MD 06/04/17 1500    Eula Listen, MD 06/04/17 [id number]

## 2017-06-04 NOTE — Discharge Instructions (Addendum)
Please take a bland diet for the next 48 hours, then advance to regular diet as tolerated. You may take Zofran for nausea and vomiting.  YOU HAVE BEEN SCHEDULED FOR DIALYSIS AT 2:45PM TOMORROW, 06/05/2017.  PLEASE TAKE ONE MORE DOSE OF KAYEXYLATE AT 7AM TOMORROW TO PREVENT HIGH POTASSIUM.  Please make an appointment with your primary care physician for reevaluation.  Return to the emergency department if you develop severe pain, inability to keep down fluids, fever, or any other symptoms concerning to you.

## 2017-06-04 NOTE — ED Triage Notes (Signed)
Pt reports generalized weakness, vomiting and body aches and abdominal pain for five days. Pt reports last completed dialysis Tuesday but was unable today.

## 2017-06-04 NOTE — ED Notes (Signed)
Interpreter used to give meds at this time

## 2017-06-09 ENCOUNTER — Other Ambulatory Visit: Payer: Self-pay

## 2017-06-09 ENCOUNTER — Inpatient Hospital Stay
Admission: EM | Admit: 2017-06-09 | Discharge: 2017-06-13 | DRG: 371 | Disposition: A | Discharge status: Home / Self Care | Payer: Medicaid Other | Attending: Internal Medicine | Admitting: Internal Medicine

## 2017-06-09 ENCOUNTER — Emergency Department: Payer: Medicaid Other

## 2017-06-09 ENCOUNTER — Encounter: Payer: Self-pay | Admitting: *Deleted

## 2017-06-09 DIAGNOSIS — R188 Other ascites: Secondary | ICD-10-CM

## 2017-06-09 DIAGNOSIS — D631 Anemia in chronic kidney disease: Secondary | ICD-10-CM | POA: Diagnosis present

## 2017-06-09 DIAGNOSIS — Z888 Allergy status to other drugs, medicaments and biological substances status: Secondary | ICD-10-CM | POA: Diagnosis not present

## 2017-06-09 DIAGNOSIS — Z6828 Body mass index (BMI) 28.0-28.9, adult: Secondary | ICD-10-CM | POA: Diagnosis not present

## 2017-06-09 DIAGNOSIS — E875 Hyperkalemia: Secondary | ICD-10-CM | POA: Diagnosis present

## 2017-06-09 DIAGNOSIS — I132 Hypertensive heart and chronic kidney disease with heart failure and with stage 5 chronic kidney disease, or end stage renal disease: Secondary | ICD-10-CM | POA: Diagnosis present

## 2017-06-09 DIAGNOSIS — K7031 Alcoholic cirrhosis of liver with ascites: Secondary | ICD-10-CM | POA: Diagnosis present

## 2017-06-09 DIAGNOSIS — N186 End stage renal disease: Secondary | ICD-10-CM | POA: Diagnosis present

## 2017-06-09 DIAGNOSIS — Z841 Family history of disorders of kidney and ureter: Secondary | ICD-10-CM | POA: Diagnosis not present

## 2017-06-09 DIAGNOSIS — K652 Spontaneous bacterial peritonitis: Secondary | ICD-10-CM | POA: Diagnosis present

## 2017-06-09 DIAGNOSIS — N2581 Secondary hyperparathyroidism of renal origin: Secondary | ICD-10-CM | POA: Diagnosis present

## 2017-06-09 DIAGNOSIS — R0789 Other chest pain: Secondary | ICD-10-CM | POA: Diagnosis present

## 2017-06-09 DIAGNOSIS — E440 Moderate protein-calorie malnutrition: Secondary | ICD-10-CM | POA: Diagnosis present

## 2017-06-09 DIAGNOSIS — I5042 Chronic combined systolic (congestive) and diastolic (congestive) heart failure: Secondary | ICD-10-CM | POA: Diagnosis present

## 2017-06-09 DIAGNOSIS — Z992 Dependence on renal dialysis: Secondary | ICD-10-CM | POA: Diagnosis not present

## 2017-06-09 DIAGNOSIS — F10188 Alcohol abuse with other alcohol-induced disorder: Secondary | ICD-10-CM | POA: Diagnosis present

## 2017-06-09 DIAGNOSIS — F17210 Nicotine dependence, cigarettes, uncomplicated: Secondary | ICD-10-CM | POA: Diagnosis present

## 2017-06-09 DIAGNOSIS — Z833 Family history of diabetes mellitus: Secondary | ICD-10-CM | POA: Diagnosis not present

## 2017-06-09 LAB — COMPREHENSIVE METABOLIC PANEL
ALT: 12 U/L — ABNORMAL LOW (ref 17–63)
ANION GAP: 15 (ref 5–15)
AST: 25 U/L (ref 15–41)
Albumin: 3.5 g/dL (ref 3.5–5.0)
Alkaline Phosphatase: 204 U/L — ABNORMAL HIGH (ref 38–126)
BUN: 25 mg/dL — ABNORMAL HIGH (ref 6–20)
CALCIUM: 9.1 mg/dL (ref 8.9–10.3)
CHLORIDE: 95 mmol/L — AB (ref 101–111)
CO2: 26 mmol/L (ref 22–32)
Creatinine, Ser: 6.96 mg/dL — ABNORMAL HIGH (ref 0.61–1.24)
GFR, EST AFRICAN AMERICAN: 10 mL/min — AB (ref >60)
GFR, EST NON AFRICAN AMERICAN: 8 mL/min — AB (ref >60)
Glucose, Bld: 83 mg/dL (ref 65–99)
Potassium: 4 mmol/L (ref 3.5–5.1)
SODIUM: 136 mmol/L (ref 135–145)
Total Bilirubin: 0.9 mg/dL (ref 0.3–1.2)
Total Protein: 7.4 g/dL (ref 6.5–8.1)

## 2017-06-09 LAB — BODY FLUID CELL COUNT WITH DIFFERENTIAL
EOS FL: 0 %
Lymphs, Fluid: 58 %
Monocyte-Macrophage-Serous Fluid: 8 %
NEUTROPHIL FLUID: 34 %
OTHER CELLS FL: 0 %
WBC FLUID: 739 uL

## 2017-06-09 LAB — CBC
HCT: 37.1 % — ABNORMAL LOW (ref 40.0–52.0)
Hemoglobin: 12.2 g/dL — ABNORMAL LOW (ref 13.0–18.0)
MCH: 31.2 pg (ref 26.0–34.0)
MCHC: 32.9 g/dL (ref 32.0–36.0)
MCV: 94.8 fL (ref 80.0–100.0)
Platelets: 143 10*3/uL — ABNORMAL LOW (ref 150–440)
RBC: 3.91 MIL/uL — ABNORMAL LOW (ref 4.40–5.90)
RDW: 15.4 % — AB (ref 11.5–14.5)
WBC: 4.5 10*3/uL (ref 3.8–10.6)

## 2017-06-09 LAB — TSH: TSH: 5.816 u[IU]/mL — ABNORMAL HIGH (ref 0.350–4.500)

## 2017-06-09 LAB — TROPONIN I
TROPONIN I: 0.04 ng/mL — AB (ref <0.03)
TROPONIN I: 0.04 ng/mL — AB (ref <0.03)

## 2017-06-09 LAB — LIPASE, BLOOD: LIPASE: 50 U/L (ref 11–51)

## 2017-06-09 MED ORDER — MORPHINE SULFATE (PF) 2 MG/ML IV SOLN
2.0000 mg | INTRAVENOUS | Status: DC | PRN
  Administered 2017-06-09 – 2017-06-12 (×9): 2 mg via INTRAVENOUS
  Filled 2017-06-09 (×9): qty 1

## 2017-06-09 MED ORDER — CEFTRIAXONE SODIUM IN DEXTROSE 20 MG/ML IV SOLN
1.0000 g | Freq: Once | INTRAVENOUS | Status: AC
  Administered 2017-06-09: 1 g via INTRAVENOUS
  Filled 2017-06-09: qty 50

## 2017-06-09 MED ORDER — AMLODIPINE BESYLATE 10 MG PO TABS
10.0000 mg | ORAL_TABLET | Freq: Every day | ORAL | Status: DC
  Administered 2017-06-10 – 2017-06-13 (×4): 10 mg via ORAL
  Filled 2017-06-09 (×5): qty 1

## 2017-06-09 MED ORDER — CEFTRIAXONE SODIUM IN DEXTROSE 20 MG/ML IV SOLN: 1.0000 g | INTRAVENOUS | Status: DC

## 2017-06-09 MED ORDER — LOSARTAN POTASSIUM 50 MG PO TABS
100.0000 mg | ORAL_TABLET | Freq: Every day | ORAL | Status: DC
  Administered 2017-06-10 – 2017-06-13 (×4): 100 mg via ORAL
  Filled 2017-06-09 (×5): qty 2

## 2017-06-09 MED ORDER — ONDANSETRON HCL 4 MG/2ML IJ SOLN
4.0000 mg | Freq: Once | INTRAMUSCULAR | Status: AC
  Administered 2017-06-09: 4 mg via INTRAVENOUS
  Filled 2017-06-09: qty 2

## 2017-06-09 MED ORDER — CALCIUM ACETATE (PHOS BINDER) 667 MG PO TABS
2001.0000 mg | ORAL_TABLET | Freq: Three times a day (TID) | ORAL | Status: DC
  Filled 2017-06-09 (×2): qty 3

## 2017-06-09 MED ORDER — HYDRALAZINE HCL 20 MG/ML IJ SOLN: 10.0000 mg | Freq: Four times a day (QID) | INTRAMUSCULAR | Status: DC | PRN

## 2017-06-09 MED ORDER — HYDRALAZINE HCL 20 MG/ML IJ SOLN
10.0000 mg | INTRAMUSCULAR | Status: DC | PRN
  Filled 2017-06-09: qty 1

## 2017-06-09 MED ORDER — LIDOCAINE HCL (PF) 1 % IJ SOLN
5.0000 mL | Freq: Once | INTRAMUSCULAR | Status: AC
  Administered 2017-06-09: 5 mL via INTRADERMAL
  Filled 2017-06-09: qty 5

## 2017-06-09 MED ORDER — FERRIC CITRATE 1 GM 210 MG(FE) PO TABS
420.0000 mg | ORAL_TABLET | Freq: Three times a day (TID) | ORAL | Status: DC
  Administered 2017-06-10 – 2017-06-13 (×9): 420 mg via ORAL
  Filled 2017-06-09 (×12): qty 2

## 2017-06-09 MED ORDER — ACETAMINOPHEN 325 MG PO TABS
650.0000 mg | ORAL_TABLET | Freq: Four times a day (QID) | ORAL | Status: DC | PRN
  Administered 2017-06-11: 650 mg via ORAL
  Filled 2017-06-09: qty 2

## 2017-06-09 MED ORDER — ONDANSETRON 4 MG PO TBDP: 4.0000 mg | ORAL_TABLET | Freq: Three times a day (TID) | ORAL | Status: DC | PRN

## 2017-06-09 MED ORDER — HYDROCODONE-ACETAMINOPHEN 5-325 MG PO TABS
1.0000 | ORAL_TABLET | ORAL | Status: DC | PRN
  Administered 2017-06-09 – 2017-06-13 (×8): 2 via ORAL
  Filled 2017-06-09 (×11): qty 2

## 2017-06-09 MED ORDER — SODIUM CHLORIDE 0.9 % IV BOLUS (SEPSIS)
500.0000 mL | Freq: Once | INTRAVENOUS | Status: AC
  Administered 2017-06-09: 500 mL via INTRAVENOUS

## 2017-06-09 MED ORDER — CHOLESTYRAMINE 4 G PO PACK
4.0000 g | PACK | Freq: Three times a day (TID) | ORAL | Status: DC
  Administered 2017-06-10 – 2017-06-12 (×5): 4 g via ORAL
  Filled 2017-06-09 (×12): qty 1

## 2017-06-09 MED ORDER — MORPHINE SULFATE (PF) 4 MG/ML IV SOLN
4.0000 mg | Freq: Once | INTRAVENOUS | Status: AC
  Administered 2017-06-09: 4 mg via INTRAVENOUS

## 2017-06-09 MED ORDER — DEXTROSE 5 % IV SOLN
2.0000 g | INTRAVENOUS | Status: DC
  Filled 2017-06-09: qty 2

## 2017-06-09 MED ORDER — HYDROXYZINE HCL 25 MG PO TABS: 25.0000 mg | ORAL_TABLET | Freq: Three times a day (TID) | ORAL | Status: DC | PRN

## 2017-06-09 MED ORDER — ONDANSETRON HCL 4 MG PO TABS
4.0000 mg | ORAL_TABLET | Freq: Four times a day (QID) | ORAL | Status: DC | PRN
  Administered 2017-06-12: 4 mg via ORAL
  Filled 2017-06-09: qty 1

## 2017-06-09 MED ORDER — PANTOPRAZOLE SODIUM 40 MG PO TBEC
40.0000 mg | DELAYED_RELEASE_TABLET | Freq: Every day | ORAL | Status: DC
  Administered 2017-06-09 – 2017-06-13 (×5): 40 mg via ORAL
  Filled 2017-06-09 (×5): qty 1

## 2017-06-09 MED ORDER — ONDANSETRON HCL 4 MG/2ML IJ SOLN
4.0000 mg | Freq: Four times a day (QID) | INTRAMUSCULAR | Status: DC | PRN
  Administered 2017-06-10 – 2017-06-13 (×5): 4 mg via INTRAVENOUS
  Filled 2017-06-09 (×5): qty 2

## 2017-06-09 MED ORDER — METOPROLOL TARTRATE 25 MG PO TABS
25.0000 mg | ORAL_TABLET | Freq: Two times a day (BID) | ORAL | Status: DC
  Administered 2017-06-09 – 2017-06-12 (×6): 25 mg via ORAL
  Filled 2017-06-09 (×6): qty 1

## 2017-06-09 MED ORDER — MORPHINE SULFATE (PF) 4 MG/ML IV SOLN
4.0000 mg | Freq: Once | INTRAVENOUS | Status: AC
  Administered 2017-06-09: 4 mg via INTRAVENOUS
  Filled 2017-06-09: qty 1

## 2017-06-09 MED ORDER — HEPARIN SODIUM (PORCINE) 5000 UNIT/ML IJ SOLN
5000.0000 [IU] | Freq: Three times a day (TID) | INTRAMUSCULAR | Status: DC
  Administered 2017-06-09 – 2017-06-12 (×9): 5000 [IU] via SUBCUTANEOUS
  Filled 2017-06-09 (×10): qty 1

## 2017-06-09 MED ORDER — MORPHINE SULFATE (PF) 2 MG/ML IV SOLN
INTRAVENOUS | Status: AC
  Filled 2017-06-09: qty 2

## 2017-06-09 MED ORDER — LORAZEPAM 0.5 MG PO TABS
0.5000 mg | ORAL_TABLET | ORAL | Status: DC | PRN
  Administered 2017-06-09: 0.5 mg via ORAL
  Filled 2017-06-09: qty 1

## 2017-06-09 MED ORDER — ACETAMINOPHEN 650 MG RE SUPP: 650.0000 mg | Freq: Four times a day (QID) | RECTAL | Status: DC | PRN

## 2017-06-09 NOTE — H&P (Signed)
[place] [facility] [place] NAME: Mario [name]    MR#:  [id number]  DATE OF BIRTH:  10-31-1970  DATE OF ADMISSION:  06/09/2017  PRIMARY CARE PHYSICIAN: [name], MD   REQUESTING/REFERRING PHYSICIAN: Rudene Re MD  CHIEF COMPLAINT:   Chief Complaint  Patient presents with  . Chest Pain    HISTORY OF PRESENT ILLNESS: Mario [name]  is a 46 y.o. male with a known history of End-stage renal disease, liver cirrhosis, essential hypertension who is  Presenting with multiple Complain, one of the main complaint that she he has is abdominal pain epigastric in nature.  Patient does have a history of SBP.  The emergency room physician had this drained.  And it is showing greater than 700 white blood cells therefore were asked to admit him for SBP.  Patient also complains of having nausea but no vomiting.  He complains of a headache and chest pains.   PAST MEDICAL HISTORY:   Past Medical History:  Diagnosis Date  . Abdominal pain 08/28/2016  . Acute hyperkalemia 10/28/2016  . Chest pain 04/11/2016  . Chronic combined systolic and diastolic CHF (congestive heart failure) ([place])   . Cirrhosis ([place])   . Dialysis patient ([place])   . ESRD (end stage renal disease) on dialysis ([place]) 01/25/2015  . Fluid overload 04/12/2016  . HTN (hypertension) 10/13/2016  . Hyperkalemia 11/27/2016  . Hypertension   . Pulmonary edema 07/03/2016  . Renal disorder   . Renal insufficiency   . Scrotal infection 01/25/2015    PAST SURGICAL HISTORY:  Past Surgical History:  Procedure Laterality Date  . AV FISTULA PLACEMENT      SOCIAL HISTORY:  Social History   Tobacco Use  . Smoking status: Current Every Day Smoker    Packs/day: 0.25    Years: 25.00    Pack years: 6.25    Types: Cigarettes  . Smokeless tobacco: Never Used  Substance Use Topics  . Alcohol use: No    Comment: 1/2 bottle tequilla per day    FAMILY HISTORY:  Family History  Problem  Relation Age of Onset  . Kidney failure Father   . Diabetes Mother     DRUG ALLERGIES:  Allergies  Allergen Reactions  . Betadine [Povidone Iodine] Rash    REVIEW OF SYSTEMS:   CONSTITUTIONAL: No fever, fatigue or weakness.  EYES: No blurred or double vision.  EARS, NOSE, AND THROAT: No tinnitus or ear pain.  RESPIRATORY: No cough, shortness of breath, wheezing or hemoptysis.  CARDIOVASCULAR: Positive chest pain, orthopnea, edema.  GASTROINTESTINAL: Positive nausea, vomiting, diarrhea or positive abdominal pain.  GENITOURINARY: No dysuria, hematuria.  ENDOCRINE: No polyuria, nocturia,  HEMATOLOGY: No anemia, easy bruising or bleeding SKIN: No rash or lesion. MUSCULOSKELETAL: No joint pain or arthritis.   NEUROLOGIC: No tingling, numbness, weakness.  PSYCHIATRY: No anxiety or depression.   MEDICATIONS AT HOME:  Prior to Admission medications   Medication Sig Start Date End Date Taking? Authorizing Provider  amLODipine (NORVASC) 10 MG tablet Take 1 tablet (10 mg total) by mouth daily. 08/29/16   Hillary Bow, MD  calcium acetate (PHOSLO) 667 MG tablet Take 3 tablets (2,001 mg total) by mouth 3 (three) times daily with meals. 01/07/17   Henreitta Leber, MD  cholestyramine (QUESTRAN) 4 g packet Take 1 packet (4 g total) by mouth 3 (three) times daily with meals. Patient not taking: Reported on 06/04/2017 01/06/17   Lavonia Drafts, MD  ciprofloxacin (CIPRO) 500  MG tablet Take 1 tablet (500 mg total) by mouth at bedtime. Patient not taking: Reported on 02/25/2017 11/30/16   Loletha Grayer, MD  ferric citrate (AURYXIA) 1 GM 210 MG(Fe) tablet Take 420 mg by mouth 3 (three) times daily with meals.    [provider]  hydrOXYzine (ATARAX/VISTARIL) 25 MG tablet Take 1 tablet (25 mg total) by mouth 3 (three) times daily as needed for itching. Patient not taking: Reported on 06/04/2017 01/14/17   Hinda Kehr, MD  LORazepam (ATIVAN) 0.5 MG tablet Take 0.5mg  tablet prior to dialysis for  anxiety if needed 04/17/15   [provider]  losartan (COZAAR) 100 MG tablet Take 1 tablet (100 mg total) by mouth daily. 10/14/16   Fritzi Mandes, MD  metoprolol tartrate (LOPRESSOR) 25 MG tablet Take 1 tablet (25 mg total) by mouth 2 (two) times daily. 04/16/16   Henreitta Leber, MD  ondansetron (ZOFRAN ODT) 4 MG disintegrating tablet Take 1 tablet (4 mg total) by mouth every 8 (eight) hours as needed for nausea or vomiting. 06/04/17   Eula Listen, MD  pantoprazole (PROTONIX) 40 MG tablet Take 1 tablet (40 mg total) by mouth daily. 10/14/16   Fritzi Mandes, MD  predniSONE (DELTASONE) 10 MG tablet Take 6 tabs (60 mg) PO x 3 days, then take 4 tabs (40 mg) PO x 3 days, then take 2 tabs (20 mg) PO x 3 days, then take 1 tab (10 mg) PO x 3 days, then take 1/2 tab (5 mg) PO x 4 days. Patient not taking: Reported on 02/25/2017 01/14/17   Hinda Kehr, MD      PHYSICAL EXAMINATION:   VITAL SIGNS: Blood pressure (!) 174/91, pulse 70, temperature 98 F (36.7 C), temperature source Oral, resp. rate 13, height 5\' 1"  (1.549 m), weight 150 lb (68 kg), SpO2 97 %.  GENERAL:  46 y.o.-year-old patient lying in the bed with no acute distress.  EYES: Pupils equal, round, reactive to light and accommodation. No scleral icterus. Extraocular muscles intact.  HEENT: Head atraumatic, normocephalic. Oropharynx and nasopharynx clear.  NECK:  Supple, no jugular venous distention. No thyroid enlargement, no tenderness.  LUNGS: Normal breath sounds bilaterally, no wheezing, rales,rhonchi or crepitation. No use of accessory muscles of respiration.  CARDIOVASCULAR: S1, S2 normal. No murmurs, rubs, or gallops.  ABDOMEN: Soft, mildly distended positive tenderness of the epigastric region bowel sounds present. No organomegaly or mass.  EXTREMITIES: No pedal edema, cyanosis, or clubbing.  NEUROLOGIC: Cranial nerves II through XII are intact. Muscle strength 5/5 in all extremities. Sensation intact. Gait not checked.   PSYCHIATRIC: The patient is alert and oriented x 3.  SKIN: No obvious rash, lesion, or ulcer.   LABORATORY PANEL:   CBC Recent Labs  Lab 06/04/17 1045 06/09/17 1548  WBC 4.2 4.5  HGB 12.9* 12.2*  HCT 39.1* 37.1*  PLT 120* 143*  MCV 94.8 94.8  MCH 31.2 31.2  MCHC 32.9 32.9  RDW 14.6* 15.4*   ------------------------------------------------------------------------------------------------------------------  Chemistries  Recent Labs  Lab 06/04/17 1045 06/09/17 1548  NA 136 136  K 5.6* 4.0  CL 98* 95*  CO2 23 26  GLUCOSE 84 83  BUN 41* 25*  CREATININE 9.56* 6.96*  CALCIUM 8.7* 9.1  AST 24 25  ALT 15* 12*  ALKPHOS 208* 204*  BILITOT 1.0 0.9   ------------------------------------------------------------------------------------------------------------------ estimated creatinine clearance is 10.9 mL/min (A) (by C-G formula based on SCr of 6.96 mg/dL (H)). ------------------------------------------------------------------------------------------------------------------ No results for input(s): TSH, T4TOTAL, T3FREE, THYROIDAB in the  last 72 hours.  Invalid input(s): FREET3   Coagulation profile No results for input(s): INR, PROTIME in the last 168 hours. ------------------------------------------------------------------------------------------------------------------- No results for input(s): DDIMER in the last 72 hours. -------------------------------------------------------------------------------------------------------------------  Cardiac Enzymes Recent Labs  Lab 06/04/17 1045 06/09/17 1548  TROPONINI <0.03 0.04*   ------------------------------------------------------------------------------------------------------------------ Invalid input(s): POCBNP  ---------------------------------------------------------------------------------------------------------------  Urinalysis No results found for: COLORURINE, APPEARANCEUR, LABSPEC, PHURINE, GLUCOSEU,  HGBUR, BILIRUBINUR, KETONESUR, PROTEINUR, UROBILINOGEN, NITRITE, LEUKOCYTESUR   RADIOLOGY: Dg Chest 2 View  Result Date: 06/09/2017 CLINICAL DATA:  Chest pain. EXAM: CHEST  2 VIEW COMPARISON:  Radiograph of January 14, 2017. FINDINGS: The heart size and mediastinal contours are within normal limits. No pneumothorax or pleural effusion is noted. Mild central pulmonary vascular congestion is noted. No consolidative process is noted. The visualized skeletal structures are unremarkable. IMPRESSION: Mild central pulmonary vascular congestion. No other definite abnormality seen in the chest. Electronically Signed   By: Marijo Conception, M.D.   On: 06/09/2017 16:08    EKG: Orders placed or performed during the hospital encounter of 06/09/17  . ED EKG within 10 minutes  . ED EKG within 10 minutes    IMPRESSION AND PLAN: Patient is a 46 year old with liver cirrhosis and end-stage renal disease with abdominal pain  1.  Spontaneous bacterial peritonitis Continue therapy with IV Rocephin has been given in the ED Follow cultures  2.  End-stage renal disease Nephrology consult  3.  Essential hypertension Continue therapy with amlodipine, Cozaar  4.  Chest pain atypical in nature monitor symtoms  5. Liver cirrhosis supportive care  6.  Heparin for DVT prophylaxis   All the records are reviewed and case discussed with ED provider. Management plans discussed with the patient, family and they are in agreement.  CODE STATUS: Code Status History    Date Active Date Inactive Code Status Order ID Comments User Context   01/06/2017 14:37 01/07/2017 17:44 Full Code [id number]  Epifanio Lesches, MD ED   11/27/2016 14:31 11/30/2016 14:23 Full Code [id number]  Henreitta Leber, MD Inpatient   10/28/2016 16:11 10/31/2016 16:38 Full Code [id number]  Epifanio Lesches, MD ED   10/13/2016 21:36 10/14/2016 21:23 Full Code [id number]  Lance Coon, MD Inpatient   08/28/2016 16:58 08/30/2016 17:19 Full Code [id number]   Fritzi Mandes, MD ED   07/03/2016 20:06 07/06/2016 16:22 Full Code [id number]  Fritzi Mandes, MD ED   04/11/2016 18:57 04/12/2016 07:16 Full Code [id number]  Vaughan Basta, MD Inpatient   01/25/2015 12:29 01/28/2015 16:26 Full Code [id number]  Dustin Flock, MD Inpatient       TOTAL TIME TAKING CARE OF THIS PATIENT:55 minutes.    Dustin Flock M.D on 06/09/2017 at 7:22 PM  Between 7am to 6pm - Pager - [phone]  After 6pm go to [email or web address] - password [email or web address]  Office  [phone]  CC: Primary care physician; [name], MD

## 2017-06-09 NOTE — ED Triage Notes (Signed)
PT to ED reporting three days of abd pain with vomiting that burns in chest when vomiting. Pt was given 1 Nitro at dialysis because they verbalived he was reporting chest pain. Abd distention noted.

## 2017-06-09 NOTE — ED Provider Notes (Signed)
[facility] Emergency Department Provider Note  ____________________________________________  Time seen: Approximately 3:49 PM  I have reviewed the triage vital signs and the nursing notes.   HISTORY  Chief Complaint Chest Pain   HPI Mario [name] is a 46 y.o. male history of ESRD on HD (T,T, S), alcohol abuse cirrhosis, HTN, HLD who presents for evaluation of abdominal pain.patient reports one week of diffuse abdominal pain worse in the epigastric region, sharp, severe, associated with nausea and several episodes of nonbloody nonbilious emesis. Worsening abdominal distention in the last few weeks. Patient was seen here 5 days ago for similar complaints and at that time had a CT scan that was unremarkable. Patient reports prior history of SBP. No paracentesis was attempted during that visit. Patient denies fever or chills. Patient is receiving Cipro during dialysis but he does not know why. No diarrhea or constipation. While in dialysis patient was vomiting and started having burning sensation in his chest. They stopped dialysis o1 hour shorter. Patient received nitro per EMS with no changes in his pain. Patient reports burning sensation in the center of his chest every time he vomits. No SOB, no h/o heart disease.   Past Medical History:  Diagnosis Date  . Abdominal pain 08/28/2016  . Acute hyperkalemia 10/28/2016  . Chest pain 04/11/2016  . Chronic combined systolic and diastolic CHF (congestive heart failure) ([place])   . Cirrhosis ([place])   . Dialysis patient ([place])   . ESRD (end stage renal disease) on dialysis ([place]) 01/25/2015  . Fluid overload 04/12/2016  . HTN (hypertension) 10/13/2016  . Hyperkalemia 11/27/2016  . Hypertension   . Pulmonary edema 07/03/2016  . Renal disorder   . Renal insufficiency   . Scrotal infection 01/25/2015    Patient Active Problem List   Diagnosis Date Noted  . SBP (spontaneous bacterial peritonitis) ([place]) 06/09/2017  .  Erroneous encounter - disregard 03/04/2017  . Hyperkalemia 11/27/2016  . Acute hyperkalemia 10/28/2016  . Chronic combined systolic and diastolic CHF (congestive heart failure) ([place]) 10/13/2016  . Cirrhosis ([place]) 10/13/2016  . HTN (hypertension) 10/13/2016  . Abdominal pain 08/28/2016  . Pulmonary edema 07/03/2016  . Fluid overload 04/12/2016  . Chest pain 04/11/2016  . Scrotal infection 01/25/2015  . ESRD (end stage renal disease) on dialysis ([place]) 01/25/2015    Past Surgical History:  Procedure Laterality Date  . AV FISTULA PLACEMENT      Prior to Admission medications   Medication Sig Start Date End Date Taking? Authorizing Provider  amLODipine (NORVASC) 10 MG tablet Take 1 tablet (10 mg total) by mouth daily. 08/29/16   Hillary Bow, MD  calcium acetate (PHOSLO) 667 MG tablet Take 3 tablets (2,001 mg total) by mouth 3 (three) times daily with meals. 01/07/17   Henreitta Leber, MD  cholestyramine (QUESTRAN) 4 g packet Take 1 packet (4 g total) by mouth 3 (three) times daily with meals. Patient not taking: Reported on 06/04/2017 01/06/17   Lavonia Drafts, MD  ciprofloxacin (CIPRO) 500 MG tablet Take 1 tablet (500 mg total) by mouth at bedtime. Patient not taking: Reported on 02/25/2017 11/30/16   Loletha Grayer, MD  ferric citrate (AURYXIA) 1 GM 210 MG(Fe) tablet Take 420 mg by mouth 3 (three) times daily with meals.    [provider]  hydrOXYzine (ATARAX/VISTARIL) 25 MG tablet Take 1 tablet (25 mg total) by mouth 3 (three) times daily as needed for itching. Patient not taking: Reported on 06/04/2017 01/14/17   Hinda Kehr,  MD  LORazepam (ATIVAN) 0.5 MG tablet Take 0.5mg  tablet prior to dialysis for anxiety if needed 04/17/15   [provider]  losartan (COZAAR) 100 MG tablet Take 1 tablet (100 mg total) by mouth daily. 10/14/16   Fritzi Mandes, MD  metoprolol tartrate (LOPRESSOR) 25 MG tablet Take 1 tablet (25 mg total) by mouth 2 (two) times daily. 04/16/16   Henreitta Leber, MD  ondansetron (ZOFRAN ODT) 4 MG disintegrating tablet Take 1 tablet (4 mg total) by mouth every 8 (eight) hours as needed for nausea or vomiting. 06/04/17   Eula Listen, MD  pantoprazole (PROTONIX) 40 MG tablet Take 1 tablet (40 mg total) by mouth daily. 10/14/16   Fritzi Mandes, MD  predniSONE (DELTASONE) 10 MG tablet Take 6 tabs (60 mg) PO x 3 days, then take 4 tabs (40 mg) PO x 3 days, then take 2 tabs (20 mg) PO x 3 days, then take 1 tab (10 mg) PO x 3 days, then take 1/2 tab (5 mg) PO x 4 days. Patient not taking: Reported on 02/25/2017 01/14/17   Hinda Kehr, MD    Allergies Betadine [povidone iodine]  Family History  Problem Relation Age of Onset  . Kidney failure Father   . Diabetes Mother     Social History Social History   Tobacco Use  . Smoking status: Current Every Day Smoker    Packs/day: 0.25    Years: 25.00    Pack years: 6.25    Types: Cigarettes  . Smokeless tobacco: Never Used  Substance Use Topics  . Alcohol use: No    Comment: 1/2 bottle tequilla per day  . Drug use: No    Review of Systems  Constitutional: Negative for fever. Eyes: Negative for visual changes. ENT: Negative for sore throat. Neck: No neck pain  Cardiovascular: Negative for chest pain. Respiratory: Negative for shortness of breath. Gastrointestinal:+ abdominal pain,  Distention,. Nausea, and vomiting. No diarrhea. Genitourinary: Negative for dysuria. Musculoskeletal: Negative for back pain. Skin: Negative for rash. Neurological: Negative for headaches, weakness or numbness. Psych: No SI or HI  ____________________________________________   PHYSICAL EXAM:  VITAL SIGNS: ED Triage Vitals  Enc Vitals Group     BP 06/09/17 1543 (!) 183/85     Pulse Rate 06/09/17 1543 68     Resp 06/09/17 1543 (!) 29     Temp 06/09/17 1543 98 F (36.7 C)     Temp Source 06/09/17 1543 Oral     SpO2 06/09/17 1540 98 %     Weight 06/09/17 1543 150 lb (68 kg)     Height 06/09/17  1543 5\' 1"  (1.549 m)     Head Circumference --      Peak Flow --      Pain Score --      Pain Loc --      Pain Edu? --      Excl. in [place]? --     Constitutional: Alert and oriented, chronically ill appearing.  HEENT:      Head: Normocephalic and atraumatic.         Eyes: Conjunctivae are normal. Sclera is non-icteric.       Mouth/Throat: Mucous membranes are dry.       Neck: Supple with no signs of meningismus. Cardiovascular: Regular rate and rhythm. No murmurs, gallops, or rubs. 2+ symmetrical distal pulses are present in all extremities. No JVD. Respiratory: Normal respiratory effort. Lungs are clear to auscultation bilaterally. No wheezes, crackles, or rhonchi.  Gastrointestinal: Distended with diffuse ttp worse over the epigastric region, no rebound or guarding Musculoskeletal: Nontender with normal range of motion in all extremities. No edema, cyanosis, or erythema of extremities. Neurologic: Normal speech and language. Face is symmetric. Moving all extremities. No gross focal neurologic deficits are appreciated. Skin: Skin is warm, dry and intact. No rash noted. Psychiatric: Mood and affect are normal. Speech and behavior are normal.  ____________________________________________   LABS (all labs ordered are listed, but only abnormal results are displayed)  Labs Reviewed  CBC - Abnormal; Notable for the following components:      Result Value   RBC 3.91 (*)    Hemoglobin 12.2 (*)    HCT 37.1 (*)    RDW 15.4 (*)    Platelets 143 (*)    All other components within normal limits  TROPONIN I - Abnormal; Notable for the following components:   Troponin I 0.04 (*)    All other components within normal limits  COMPREHENSIVE METABOLIC PANEL - Abnormal; Notable for the following components:   Chloride 95 (*)    BUN 25 (*)    Creatinine, Ser 6.96 (*)    ALT 12 (*)    Alkaline Phosphatase 204 (*)    GFR calc non Af Amer 8 (*)    GFR calc Af Amer 10 (*)    All other components  within normal limits  BODY FLUID CELL COUNT WITH DIFFERENTIAL - Abnormal; Notable for the following components:   Color, Fluid PINK (*)    Appearance, Fluid HAZY (*)    All other components within normal limits  BODY FLUID CULTURE  LIPASE, BLOOD   ____________________________________________  EKG  ED ECG REPORT I, Rudene Re, the attending physician, personally viewed and interpreted this ECG.  Normal sinus rhythm, rate of 68, normal intervals, normal axis, no ST elevations or depressions, T-wave inversions in inferior leads. Unchanged from prior.  ____________________________________________  RADIOLOGY  CXR: Mild central pulmonary vascular congestion. No other definite abnormality seen in the chest  ____________________________________________   PROCEDURES  Procedure(s) performed:yes .Paracentesis Date/Time: 06/09/2017 4:17 PM Performed by: Rudene Re, MD Authorized by: Rudene Re, MD   Consent:    Consent obtained:  Written   Consent given by:  Patient   Risks discussed:  Bleeding, bowel perforation, infection and pain Pre-procedure details:    Procedure purpose:  Diagnostic   Preparation: Patient was prepped and draped in usual sterile fashion   Anesthesia (see MAR for exact dosages):    Anesthesia method:  Local infiltration   Local anesthetic:  Lidocaine 1% w/o epi Procedure details:    Needle gauge:  22   Ultrasound guidance: yes     Puncture site:  R lower quadrant   Fluid removed amount:  30   Fluid appearance:  Yellow   Dressing:  Adhesive bandage Post-procedure details:    Patient tolerance of procedure:  Tolerated well, no immediate complications   Critical Care performed:  None ____________________________________________   INITIAL IMPRESSION / ASSESSMENT AND PLAN / ED COURSE  46 y.o. male history of ESRD on HD (T,T, S), alcohol abuse cirrhosis, HTN, HLD who presents for evaluation of 7 days of worsening abdominal pain,  nausea, and NBNB emesis. Worse during HD today associated with burning in his chest after vomiting. patient hemodynamically stable but looks uncomfortable, afebrile, abdomen is distended with diffuse tenderness to palpation worse in the epigastric region. Review of Epic shows that patient was here 5 days ago for similar and had a  CT scan at that time that did not show any acute pathology. patient was noted to have cirrhosis and no paracentesis was done at that time. We'll do paracentesis to rule out SBP. We'll check labs including CBC, CMP, lipase for other etiologies such as gallbladder disease, pancreatitis. With a negative CT 5 days ago and patient presenting with the same exact complaint and oblique patient needs a repeat CT at this time. We'll give fluids, morphine, and Zofran for symptom relief. EKG is pending.    _________________________ 6:15 PM on 06/09/2017 -----------------------------------------  paracentesis consistent with a spontaneous bacterial peritonitis. Patient was given Rocephin and then to be admitted to the hospitalist service for further management.   As part of my medical decision making, I reviewed the following data within the [place] notes reviewed and incorporated, Labs reviewed , Old chart reviewed, Discussed with admitting physician , Notes from prior ED visits and [place] Controlled Substance Database    Pertinent labs & imaging results that were available during my care of the patient were reviewed by me and considered in my medical decision making (see chart for details).    ____________________________________________   FINAL CLINICAL IMPRESSION(S) / ED DIAGNOSES  Final diagnoses:  SBP (spontaneous bacterial peritonitis) ([place])      NEW MEDICATIONS STARTED DURING THIS VISIT:  This SmartLink is deprecated. Use AVSMEDLIST instead to display the medication list for a patient.   Note:  This document was prepared using Dragon voice  recognition software and may include unintentional dictation errors.    Rudene Re, MD 06/09/17 2044

## 2017-06-09 NOTE — ED Notes (Signed)
Patient transported to X-ray 

## 2017-06-09 NOTE — ED Notes (Signed)
This RN walked abd fluid sample to lab and handed to lab tech.

## 2017-06-09 NOTE — ED Notes (Signed)
Lidocaine at bedside.

## 2017-06-10 LAB — CBC
HCT: 36.6 % — ABNORMAL LOW (ref 40.0–52.0)
HEMOGLOBIN: 12 g/dL — AB (ref 13.0–18.0)
MCH: 31.7 pg (ref 26.0–34.0)
MCHC: 32.8 g/dL (ref 32.0–36.0)
MCV: 96.6 fL (ref 80.0–100.0)
Platelets: 116 10*3/uL — ABNORMAL LOW (ref 150–440)
RBC: 3.79 MIL/uL — AB (ref 4.40–5.90)
RDW: 15.5 % — ABNORMAL HIGH (ref 11.5–14.5)
WBC: 3.4 10*3/uL — ABNORMAL LOW (ref 3.8–10.6)

## 2017-06-10 LAB — BASIC METABOLIC PANEL
ANION GAP: 12 (ref 5–15)
BUN: 29 mg/dL — ABNORMAL HIGH (ref 6–20)
CHLORIDE: 96 mmol/L — AB (ref 101–111)
CO2: 27 mmol/L (ref 22–32)
Calcium: 9.4 mg/dL (ref 8.9–10.3)
Creatinine, Ser: 7.99 mg/dL — ABNORMAL HIGH (ref 0.61–1.24)
GFR calc non Af Amer: 7 mL/min — ABNORMAL LOW (ref >60)
GFR, EST AFRICAN AMERICAN: 8 mL/min — AB (ref >60)
Glucose, Bld: 82 mg/dL (ref 65–99)
POTASSIUM: 4.9 mmol/L (ref 3.5–5.1)
SODIUM: 135 mmol/L (ref 135–145)

## 2017-06-10 LAB — MAGNESIUM: MAGNESIUM: 2.5 mg/dL — AB (ref 1.7–2.4)

## 2017-06-10 LAB — MRSA PCR SCREENING: MRSA BY PCR: NEGATIVE

## 2017-06-10 LAB — PHOSPHORUS: PHOSPHORUS: 7.8 mg/dL — AB (ref 2.5–4.6)

## 2017-06-10 LAB — VITAMIN B12: Vitamin B-12: 1291 pg/mL — ABNORMAL HIGH (ref 180–914)

## 2017-06-10 LAB — TROPONIN I
TROPONIN I: 0.03 ng/mL — AB (ref <0.03)
Troponin I: 0.04 ng/mL (ref <0.03)

## 2017-06-10 MED ORDER — B COMPLEX-C PO TABS
1.0000 | ORAL_TABLET | Freq: Every day | ORAL | Status: DC
  Administered 2017-06-10 – 2017-06-13 (×4): 1 via ORAL
  Filled 2017-06-10 (×5): qty 1

## 2017-06-10 MED ORDER — ADULT MULTIVITAMIN LIQUID CH
15.0000 mL | ORAL | Status: DC
  Administered 2017-06-11: 15 mL via ORAL
  Filled 2017-06-10: qty 15

## 2017-06-10 MED ORDER — DEXTROSE 5 % IV SOLN
2.0000 g | INTRAVENOUS | Status: DC
  Administered 2017-06-10 – 2017-06-12 (×3): 2 g via INTRAVENOUS
  Filled 2017-06-10 (×3): qty 2

## 2017-06-10 MED ORDER — NEPRO/CARBSTEADY PO LIQD
237.0000 mL | Freq: Three times a day (TID) | ORAL | Status: DC
  Administered 2017-06-10 – 2017-06-13 (×9): 237 mL via ORAL

## 2017-06-10 MED ORDER — CALCIUM ACETATE (PHOS BINDER) 667 MG PO CAPS
2001.0000 mg | ORAL_CAPSULE | Freq: Three times a day (TID) | ORAL | Status: DC
  Administered 2017-06-10 – 2017-06-13 (×8): 2001 mg via ORAL
  Filled 2017-06-10 (×7): qty 3

## 2017-06-10 MED ORDER — FOLIC ACID 1 MG PO TABS
1.0000 mg | ORAL_TABLET | Freq: Every day | ORAL | Status: DC
  Administered 2017-06-10 – 2017-06-13 (×4): 1 mg via ORAL
  Filled 2017-06-10 (×3): qty 1

## 2017-06-10 NOTE — Consult Note (Signed)
[facility]     Reason for Consult: SBP   Referring Physician: Carlynn Spry Date of Admission:  06/09/2017   Active Problems:   SBP (spontaneous bacterial peritonitis) ([place])   HPI: Mario [name] is a 46 y.o. male with Cirrhosis, ESRD admitted with a week of abd pain, nausea vomiting. He had ct done 11/1 with just ascites. He then had para done 11/6 with 700 wbc and 251 PMNs. Started on abx. Still with some pain and nausea but no fevers.  Past Medical History:  Diagnosis Date  . Abdominal pain 08/28/2016  . Acute hyperkalemia 10/28/2016  . Chest pain 04/11/2016  . Chronic combined systolic and diastolic CHF (congestive heart failure) ([place])   . Cirrhosis ([place])   . Dialysis patient ([place])   . ESRD (end stage renal disease) on dialysis ([place]) 01/25/2015  . Fluid overload 04/12/2016  . HTN (hypertension) 10/13/2016  . Hyperkalemia 11/27/2016  . Hypertension   . Pulmonary edema 07/03/2016  . Renal disorder   . Renal insufficiency   . Scrotal infection 01/25/2015   Past Surgical History:  Procedure Laterality Date  . AV FISTULA PLACEMENT     Social History   Tobacco Use  . Smoking status: Current Every Day Smoker    Packs/day: 0.25    Years: 25.00    Pack years: 6.25    Types: Cigarettes  . Smokeless tobacco: Never Used  Substance Use Topics  . Alcohol use: No    Comment: 1/2 bottle tequilla per day  . Drug use: No   Family History  Problem Relation Age of Onset  . Kidney failure Father   . Diabetes Mother     Allergies:  Allergies  Allergen Reactions  . Betadine [Povidone Iodine] Rash    Current antibiotics: Antibiotics Given (last 72 hours)    Date/Time Action Medication Dose Rate   06/09/17 1712 New Bag/Given   cefTRIAXone (ROCEPHIN) 1 g in dextrose 5 % 50 mL IVPB - Premix 1 g 100 mL/hr   06/10/17 1151 New Bag/Given   cefTRIAXone (ROCEPHIN) 2 g in dextrose 5 % 50 mL IVPB 2 g 100 mL/hr      MEDICATIONS: . amLODipine  10 mg Oral Daily  .  B-complex with vitamin C  1 tablet Oral Daily  . calcium acetate  2,001 mg Oral TID WC  . cholestyramine  4 g Oral TID WC  . feeding supplement (NEPRO CARB STEADY)  237 mL Oral TID BM  . ferric citrate  420 mg Oral TID WC  . folic acid  1 mg Oral Daily  . heparin  5,000 Units Subcutaneous Q8H  . losartan  100 mg Oral Daily  . metoprolol tartrate  25 mg Oral BID  . [START ON 06/11/2017] multivitamin  15 mL Oral Once per day on Mon Thu  . pantoprazole  40 mg Oral Daily    Review of Systems - 11 systems reviewed and negative per HPI   OBJECTIVE: Temp:  [97.3 F (36.3 C)-97.8 F (36.6 C)] 97.8 F (36.6 C) (11/07 1147) Pulse Rate:  [55-70] 64 (11/07 1147) Resp:  [13-21] 18 (11/07 1147) BP: (141-191)/(70-92) 160/77 (11/07 1147) SpO2:  [95 %-99 %] 99 % (11/07 1147) Weight:  [71.9 kg (158 lb 8 oz)] 71.9 kg (158 lb 8 oz) (11/06 2055) Physical Exam  Constitutional: He is oriented to person, place, and time. Chronically ill appearing HENT: muddy sclera, ptyergium Mouth/Throat: Oropharynx is clear and dry . No oropharyngeal exudate.  Cardiovascular: Normal  rate, regular rhythm and normal heart sounds. Pulmonary/Chest: Effort normal and breath sounds normal. No respiratory distress. He has no wheezes.  Abdominal: Soft. Distended, ascites, has varices on abd wall. Mild diffuse ttp Lymphadenopathy: He has no cervical adenopathy.  Neurological: He is alert and oriented to person, place, and time.  Skin: Skin is warm and dry. No rash noted. No erythema.  Psychiatric: He has a normal mood and affect. His behavior is normal.   LABS: Results for orders placed or performed during the hospital encounter of 06/09/17 (from the past 48 hour(s))  CBC     Status: Abnormal   Collection Time: 06/09/17  3:48 PM  Result Value Ref Range   WBC 4.5 3.8 - 10.6 K/uL   RBC 3.91 (L) 4.40 - 5.90 MIL/uL   Hemoglobin 12.2 (L) 13.0 - 18.0 g/dL   HCT 37.1 (L) 40.0 - 52.0 %   MCV 94.8 80.0 - 100.0 fL   MCH 31.2  26.0 - 34.0 pg   MCHC 32.9 32.0 - 36.0 g/dL   RDW 15.4 (H) 11.5 - 14.5 %   Platelets 143 (L) 150 - 440 K/uL  Troponin I     Status: Abnormal   Collection Time: 06/09/17  3:48 PM  Result Value Ref Range   Troponin I 0.04 (HH) <0.03 ng/mL    Comment: CRITICAL RESULT CALLED TO, READ BACK BY AND VERIFIED WITH SHANNON MARTIN 06/09/17 @ [facility]   Comprehensive metabolic panel     Status: Abnormal   Collection Time: 06/09/17  3:48 PM  Result Value Ref Range   Sodium 136 135 - 145 mmol/L   Potassium 4.0 3.5 - 5.1 mmol/L   Chloride 95 (L) 101 - 111 mmol/L   CO2 26 22 - 32 mmol/L   Glucose, Bld 83 65 - 99 mg/dL   BUN 25 (H) 6 - 20 mg/dL   Creatinine, Ser 6.96 (H) 0.61 - 1.24 mg/dL   Calcium 9.1 8.9 - 10.3 mg/dL   Total Protein 7.4 6.5 - 8.1 g/dL   Albumin 3.5 3.5 - 5.0 g/dL   AST 25 15 - 41 U/L   ALT 12 (L) 17 - 63 U/L   Alkaline Phosphatase 204 (H) 38 - 126 U/L   Total Bilirubin 0.9 0.3 - 1.2 mg/dL   GFR calc non Af Amer 8 (L) >60 mL/min   GFR calc Af Amer 10 (L) >60 mL/min    Comment: (NOTE) The eGFR has been calculated using the CKD EPI equation. This calculation has not been validated in all clinical situations. eGFR's persistently <60 mL/min signify possible Chronic Kidney Disease.    Anion gap 15 5 - 15  Lipase, blood     Status: None   Collection Time: 06/09/17  3:48 PM  Result Value Ref Range   Lipase 50 11 - 51 U/L  Body fluid culture     Status: None (Preliminary result)   Collection Time: 06/09/17  3:48 PM  Result Value Ref Range   Specimen Description ASCITIC    Special Requests NONE    Gram Stain      CYTOSPIN SMEAR WBC PRESENT, PREDOMINANTLY MONONUCLEAR NO ORGANISMS SEEN    Culture      NO GROWTH < 12 HOURS Performed at [facility], [place] [address] [place], [place] [address]    Report Status PENDING   Body fluid cell count with differential     Status: Abnormal   Collection Time: 06/09/17  3:48 PM  Result Value  Ref Range   Fluid Type-FCT  EXTRASTICIAL ABD. FLUID     Comment: CORRECTED ON 11/06 AT 1640: PREVIOUSLY REPORTED AS Peritoneal   Color, Fluid PINK (A) YELLOW   Appearance, Fluid HAZY (A) CLEAR   WBC, Fluid 739 cu mm   Neutrophil Count, Fluid 34 %   Lymphs, Fluid 58 %   Monocyte-Macrophage-Serous Fluid 8 %   Eos, Fluid 0 %   Other Cells, Fluid 0 %  Troponin I     Status: Abnormal   Collection Time: 06/09/17 10:21 PM  Result Value Ref Range   Troponin I 0.04 (HH) <0.03 ng/mL    Comment: CRITICAL VALUE NOTED. VALUE IS CONSISTENT WITH PREVIOUSLY REPORTED/CALLED VALUE BY CAF   TSH     Status: Abnormal   Collection Time: 06/09/17 10:21 PM  Result Value Ref Range   TSH 5.816 (H) 0.350 - 4.500 uIU/mL    Comment: Performed by a 3rd Generation assay with a functional sensitivity of <=0.01 uIU/mL.  CBC     Status: Abnormal   Collection Time: 06/10/17  3:56 AM  Result Value Ref Range   WBC 3.4 (L) 3.8 - 10.6 K/uL   RBC 3.79 (L) 4.40 - 5.90 MIL/uL   Hemoglobin 12.0 (L) 13.0 - 18.0 g/dL   HCT 36.6 (L) 40.0 - 52.0 %   MCV 96.6 80.0 - 100.0 fL   MCH 31.7 26.0 - 34.0 pg   MCHC 32.8 32.0 - 36.0 g/dL   RDW 15.5 (H) 11.5 - 14.5 %   Platelets 116 (L) 150 - 440 K/uL  Basic metabolic panel     Status: Abnormal   Collection Time: 06/10/17  3:56 AM  Result Value Ref Range   Sodium 135 135 - 145 mmol/L   Potassium 4.9 3.5 - 5.1 mmol/L   Chloride 96 (L) 101 - 111 mmol/L   CO2 27 22 - 32 mmol/L   Glucose, Bld 82 65 - 99 mg/dL   BUN 29 (H) 6 - 20 mg/dL   Creatinine, Ser 7.99 (H) 0.61 - 1.24 mg/dL   Calcium 9.4 8.9 - 10.3 mg/dL   GFR calc non Af Amer 7 (L) >60 mL/min   GFR calc Af Amer 8 (L) >60 mL/min    Comment: (NOTE) The eGFR has been calculated using the CKD EPI equation. This calculation has not been validated in all clinical situations. eGFR's persistently <60 mL/min signify possible Chronic Kidney Disease.    Anion gap 12 5 - 15  Troponin I     Status: Abnormal   Collection Time: 06/10/17  3:56 AM  Result  Value Ref Range   Troponin I 0.04 (HH) <0.03 ng/mL    Comment: CRITICAL VALUE NOTED. VALUE IS CONSISTENT WITH PREVIOUSLY REPORTED/CALLED VALUE BY CAF   MRSA PCR Screening     Status: None   Collection Time: 06/10/17  8:55 AM  Result Value Ref Range   MRSA by PCR NEGATIVE NEGATIVE    Comment:        The GeneXpert MRSA Assay (FDA approved for NASAL specimens only), is one component of a comprehensive MRSA colonization surveillance program. It is not intended to diagnose MRSA infection nor to guide or monitor treatment for MRSA infections.   Troponin I     Status: Abnormal   Collection Time: 06/10/17 10:02 AM  Result Value Ref Range   Troponin I 0.03 (HH) <0.03 ng/mL    Comment: CRITICAL VALUE NOTED. VALUE IS CONSISTENT WITH PREVIOUSLY REPORTED/CALLED VALUE...[facility]  Phosphorus  Status: Abnormal   Collection Time: 06/10/17 11:53 AM  Result Value Ref Range   Phosphorus 7.8 (H) 2.5 - 4.6 mg/dL  Magnesium     Status: Abnormal   Collection Time: 06/10/17 11:53 AM  Result Value Ref Range   Magnesium 2.5 (H) 1.7 - 2.4 mg/dL   No components found for: ESR, C REACTIVE PROTEIN MICRO: Recent Results (from the past 720 hour(s))  Body fluid culture     Status: None (Preliminary result)   Collection Time: 06/09/17  3:48 PM  Result Value Ref Range Status   Specimen Description ASCITIC  Final   Special Requests NONE  Final   Gram Stain   Final    CYTOSPIN SMEAR WBC PRESENT, PREDOMINANTLY MONONUCLEAR NO ORGANISMS SEEN    Culture   Final    NO GROWTH < 12 HOURS Performed at [facility], [address] [place], [place] [address]    Report Status PENDING  Incomplete  MRSA PCR Screening     Status: None   Collection Time: 06/10/17  8:55 AM  Result Value Ref Range Status   MRSA by PCR NEGATIVE NEGATIVE Final    Comment:        The GeneXpert MRSA Assay (FDA approved for NASAL specimens only), is one component of a comprehensive MRSA colonization surveillance program. It is  not intended to diagnose MRSA infection nor to guide or monitor treatment for MRSA infections.     IMAGING: Dg Chest 2 View  Result Date: 06/09/2017 CLINICAL DATA:  Chest pain. EXAM: CHEST  2 VIEW COMPARISON:  Radiograph of January 14, 2017. FINDINGS: The heart size and mediastinal contours are within normal limits. No pneumothorax or pleural effusion is noted. Mild central pulmonary vascular congestion is noted. No consolidative process is noted. The visualized skeletal structures are unremarkable. IMPRESSION: Mild central pulmonary vascular congestion. No other definite abnormality seen in the chest. Electronically Signed   By: Marijo Conception, M.D.   On: 06/09/2017 16:08   Ct Abdomen Pelvis W Contrast  Result Date: 06/04/2017 CLINICAL DATA:  Acute diffuse abdominal pain. EXAM: CT ABDOMEN AND PELVIS WITH CONTRAST TECHNIQUE: Multidetector CT imaging of the abdomen and pelvis was performed using the standard protocol following bolus administration of intravenous contrast. CONTRAST:  134m ISOVUE-300 IOPAMIDOL (ISOVUE-300) INJECTION 61% COMPARISON:  CT scan of February 25, 2017. FINDINGS: Lower chest: No acute abnormality. Hepatobiliary: No gallstones are noted. Severe hepatic cirrhosis is noted. Periesophageal varices are noted consistent with portal hypertension. Pancreas: Unremarkable. No pancreatic ductal dilatation or surrounding inflammatory changes. Spleen: Normal in size without focal abnormality. Adrenals/Urinary Tract: Adrenal glands are unremarkable. Severe bilateral renal atrophy is noted consistent with end-stage renal disease. Stable right renal cysts are noted. No hydronephrosis or renal obstruction is noted. Urinary bladder is decompressed. Stomach/Bowel: Stomach is within normal limits. Appendix appears normal. No evidence of bowel wall thickening, distention, or inflammatory changes. Vascular/Lymphatic: Aortic atherosclerosis. No enlarged abdominal or pelvic lymph nodes. Reproductive:  Prostate is unremarkable. Other: Moderate ascites is noted throughout the abdomen and pelvis. No hernia is noted. Musculoskeletal: No acute or significant osseous findings. IMPRESSION: Severe hepatic cirrhosis is noted with peer esophageal varices consistent with portal hypertension. Moderate ascites is noted as well. Severe bilateral renal atrophy is noted consistent with history of end-stage renal disease. Aortic atherosclerosis. Electronically Signed   By: JMarijo Conception M.D.   On: 06/04/2017 14:39    Assessment:   JJmarion Christianois a 46y.o. male with cirrhosis, ESRD  admitted with chest and abdominal pain and nausea and abd distention.   On admit no fevers, no wbc. CT showed ascites. Paracenteseis done with 700 total wbc, 251 PMNs. Flu negative, CXR neg  Recommendations His Ascites PMN level was > 250 so has SBP. Would continue ceftriaxone pending culture result. Would direct further therapy to culture.  If culture negative can dc on IV ceftazidime at HD for 10 total days from admission.  Thank you very much for allowing me to participate in the care of this patient. Please call with questions.   Cheral Marker. Ola Spurr, MD

## 2017-06-10 NOTE — Progress Notes (Signed)
Initial Nutrition Assessment  DOCUMENTATION CODES:   Non-severe (moderate) malnutrition in context of chronic illness  INTERVENTION:   Recommend check ascorbic acid, vitamin D, thiamine, P, Mg, and B12 levels as pt at increased risk for deficiency r/t etoh abuse and ESRD on HD  Nepro Shake po TID, each supplement provides 425 kcal and 19 grams protein  B-complex with vitamin C daily   Folic Acid 33m daily   MVI twice weekly   Recommend 1067mthiamine IV daily   NUTRITION DIAGNOSIS:   Moderate Malnutrition related to chronic illness(ESRD on HD) as evidenced by energy intake < 75% for > or equal to 1 month, moderate muscle depletions in clavicles and BLE.  GOAL:   Patient will meet greater than or equal to 90% of their needs  MONITOR:   PO intake, Supplement acceptance, Labs, Weight trends, I & O's  REASON FOR ASSESSMENT:   Malnutrition Screening Tool    ASSESSMENT:   [age].o. male with a known history of End-stage renal disease on HD, liver cirrhosis 2/2 etoh abuse, essential hypertension admitted for peritonitis     Met with pt in room today; interpreter was used. Pt reports decreased appetite and oral intake for the past 6 months but reports that he has not eaten anything for 2 days. Pt reports that he has been vomiting after he eats for 2 days. Pt initiated on clear liquid diet 11/6; pt reports that he has been able to keep down some broth. Pt does like supplements but is unable to afford them at home. Pt also reports that he is unable to afford his multivitamins. Pt is at increased risk for nutrient deficiencies secondary to losses from dialysis and increased utilization of nutrients r/t pt's etoh abuse. Pt reports that he bruises easily and reports blisters around his mouth; these signs can be indicative for B and C vitamin deficiencies. Pt also with secondary hyperparathyroidism; recommend check vitamin D levels. Per chart, pt has lost 14lbs(8%) over the past 8 months;  this is not significant given time frame but is worth noting. Recommend check vitamin labs and supplement as necessary. RD will order Nepro and MVI for when diet advanced.   Medications reviewed and include: Phoslo, ferric citrate, heparin, cholestyramine, protonix, ceftriaxine, hydrocodone, morphine  Labs reviewed: Cl 96(L), BUN 29(H), creat 7.99(H) P 5.5(H), Mg 2.7(H) iPTH- 1745(H)- 6/5 TSH- 5.816(H)- 11/6 Hgb 12.0(L), Hct 36.6(L)  Nutrition-Focused physical exam completed. Findings are no fat depletion, moderate muscle depletions in clavicles and BLE, and no edema. Pt reports blisters around his mouth and easy bruising.   Diet Order:  Diet clear liquid Room service appropriate? Yes; Fluid consistency: Thin  EDUCATION NEEDS:   Education needs have been addressed  Skin:  Reviewed RN Assessment  Last BM:  11/6  Height:   Ht Readings from Last 1 Encounters:  06/09/17 '5\' 2"'  (1.575 m)    Weight:   Wt Readings from Last 1 Encounters:  06/09/17 158 lb 8 oz (71.9 kg)    Ideal Body Weight:  53.6 kg  BMI:  Body mass index is 28.99 kg/m.  Estimated Nutritional Needs:   Kcal:  1900-2200kcal/day   Protein:  108-122g/day   Fluid:  >1.9L/day   CaKoleen DistanceS, RD, LDN Pager #- [phone] Hours Pager: [phone]

## 2017-06-10 NOTE — Progress Notes (Signed)
[facility] [place] NAME: Mario [name]    MR#:  [id number]  DATE OF BIRTH:  Dec 27, 1970  SUBJECTIVE:  CHIEF COMPLAINT:   Chief Complaint  Patient presents with  . Chest Pain  Still in pain, no other complaints, Spanish interpreter at bedside REVIEW OF SYSTEMS:  Review of Systems  Constitutional: Negative for chills, fever and weight loss.  HENT: Negative for nosebleeds and sore throat.   Eyes: Negative for blurred vision.  Respiratory: Negative for cough, shortness of breath and wheezing.   Cardiovascular: Negative for chest pain, orthopnea, leg swelling and PND.  Gastrointestinal: Positive for abdominal pain and nausea. Negative for constipation, diarrhea, heartburn and vomiting.  Genitourinary: Negative for dysuria and urgency.  Musculoskeletal: Negative for back pain.  Skin: Negative for rash.  Neurological: Negative for dizziness, speech change, focal weakness and headaches.  Endo/Heme/Allergies: Does not bruise/bleed easily.  Psychiatric/Behavioral: Negative for depression.    DRUG ALLERGIES:   Allergies  Allergen Reactions  . Betadine [Povidone Iodine] Rash   VITALS:  Blood pressure (!) 160/77, pulse 64, temperature 97.8 F (36.6 C), temperature source Oral, resp. rate 18, height 5\' 2"  (1.575 m), weight 71.9 kg (158 lb 8 oz), SpO2 99 %. PHYSICAL EXAMINATION:  Physical Exam  Constitutional: He is oriented to person, place, and time and well-developed, well-nourished, and in no distress.  HENT:  Head: Normocephalic and atraumatic.  Eyes: Conjunctivae and EOM are normal. Pupils are equal, round, and reactive to light.  Neck: Normal range of motion. Neck supple. No tracheal deviation present. No thyromegaly present.  Cardiovascular: Normal rate, regular rhythm and normal heart sounds.  Pulmonary/Chest: Effort normal and breath sounds normal. No respiratory distress. He has no wheezes. He exhibits no  tenderness.  Abdominal: Soft. Bowel sounds are normal. He exhibits no distension. There is generalized tenderness.  Musculoskeletal: Normal range of motion.  Neurological: He is alert and oriented to person, place, and time. No cranial nerve deficit.  Skin: Skin is warm and dry. No rash noted.  Psychiatric: Mood and affect normal.   LABORATORY PANEL:  Male CBC Recent Labs  Lab 06/10/17 0356  WBC 3.4*  HGB 12.0*  HCT 36.6*  PLT 116*   ------------------------------------------------------------------------------------------------------------------ Chemistries  Recent Labs  Lab 06/09/17 1548 06/10/17 0356 06/10/17 1153  NA 136 135  --   K 4.0 4.9  --   CL 95* 96*  --   CO2 26 27  --   GLUCOSE 83 82  --   BUN 25* 29*  --   CREATININE 6.96* 7.99*  --   CALCIUM 9.1 9.4  --   MG  --   --  2.5*  AST 25  --   --   ALT 12*  --   --   ALKPHOS 204*  --   --   BILITOT 0.9  --   --    RADIOLOGY:  Dg Chest 2 View  Result Date: 06/09/2017 CLINICAL DATA:  Chest pain. EXAM: CHEST  2 VIEW COMPARISON:  Radiograph of January 14, 2017. FINDINGS: The heart size and mediastinal contours are within normal limits. No pneumothorax or pleural effusion is noted. Mild central pulmonary vascular congestion is noted. No consolidative process is noted. The visualized skeletal structures are unremarkable. IMPRESSION: Mild central pulmonary vascular congestion. No other definite abnormality seen in the chest. Electronically Signed   By: Marijo Conception, M.D.   On: 06/09/2017  16:08   ASSESSMENT AND PLAN:  Patient is a 46 year old with liver cirrhosis and end-stage renal disease with abdominal pain  1.  Spontaneous bacterial peritonitis: Based on clinical symptoms and body fluid analysis - Continue IV Rocephin  -Await ID consult, discussed with Dr. Ola Spurr  2.  End-stage renal disease Nephrology following for hemodialysis need  3.  Essential hypertension Continue amlodipine, Cozaar,  metoprolol  4.  Chest pain atypical in nature.  Now resolved  5. Liver cirrhosis supportive care  6.  Heparin for DVT prophylaxis      All the records are reviewed and case discussed with Care Management/Social Worker. Management plans discussed with the patient, nursing, Dr. Ola Spurr and they are in agreement.  CODE STATUS: Full Code  TOTAL TIME TAKING CARE OF THIS PATIENT: 25 minutes.   More than 50% of the time was spent in counseling/coordination of care: YES  POSSIBLE D/C IN 1-2 DAYS, DEPENDING ON CLINICAL CONDITION.   Max Sane M.D on 06/10/2017 at 2:36 PM  Between 7am to 6pm - Pager - [phone]  After 6pm go to [email or web address] - Proofreader  [facility]  Office  [phone]  CC: Primary care physician; [name], MD  Note: This dictation was prepared with Dragon dictation along with smaller phrase technology. Any transcriptional errors that result from this process are unintentional.

## 2017-06-10 NOTE — Progress Notes (Signed)
[facility]  ROUNDING NOTE   Subjective:   [name] admitted to [facility] on 06/09/2017 for SBP (spontaneous bacterial peritonitis) ([place]) [K65.2]   History taken with assistance of Spanish Interpreter.   Objective:  Vital signs in last 24 hours:  Temp:  [97.3 F (36.3 C)-98 F (36.7 C)] 97.8 F (36.6 C) (11/07 1147) Pulse Rate:  [55-70] 64 (11/07 1147) Resp:  [13-29] 18 (11/07 1147) BP: (141-191)/(70-92) 160/77 (11/07 1147) SpO2:  [95 %-100 %] 99 % (11/07 1147) Weight:  [68 kg (150 lb)-71.9 kg (158 lb 8 oz)] 71.9 kg (158 lb 8 oz) (11/06 2055)  Weight change:  Filed Weights   06/09/17 1543 06/09/17 2055  Weight: 68 kg (150 lb) 71.9 kg (158 lb 8 oz)    Intake/Output: I/O last 3 completed shifts: In: 550 [IV Piggyback:550] Out: -    Intake/Output this shift:  Total I/O In: 285 [P.O.:240; IV Piggyback:45] Out: 0   Physical Exam: General: NAD, laying in bed  Head: Normocephalic, atraumatic. Moist oral mucosal membranes  Eyes: Anicteric, PERRL  Neck: Supple, trachea midline  Lungs:  Clear to auscultation  Heart: Regular rate and rhythm  Abdomen:  +distended, +tender to palpation  Extremities: trace peripheral edema.  Neurologic: Nonfocal, moving all four extremities  Skin: No lesions  Access: Left arm AVF    Basic Metabolic Panel: Recent Labs  Lab 06/04/17 1045 06/09/17 1548 06/10/17 0356 06/10/17 1153  NA 136 136 135  --   K 5.6* 4.0 4.9  --   CL 98* 95* 96*  --   CO2 23 26 27   --   GLUCOSE 84 83 82  --   BUN 41* 25* 29*  --   CREATININE 9.56* 6.96* 7.99*  --   CALCIUM 8.7* 9.1 9.4  --   MG  --   --   --  2.5*  PHOS  --   --   --  7.8*    Liver Function Tests: Recent Labs  Lab 06/04/17 1045 06/09/17 1548  AST 24 25  ALT 15* 12*  ALKPHOS 208* 204*  BILITOT 1.0 0.9  PROT 7.2 7.4  ALBUMIN 3.6 3.5   Recent Labs  Lab 06/04/17 1045 06/09/17 1548  LIPASE 34 50   No results for input(s): AMMONIA in the last 168  hours.  CBC: Recent Labs  Lab 06/04/17 1045 06/09/17 1548 06/10/17 0356  WBC 4.2 4.5 3.4*  HGB 12.9* 12.2* 12.0*  HCT 39.1* 37.1* 36.6*  MCV 94.8 94.8 96.6  PLT 120* 143* 116*    Cardiac Enzymes: Recent Labs  Lab 06/04/17 1045 06/09/17 1548 06/09/17 2221 06/10/17 0356 06/10/17 1002  TROPONINI <0.03 0.04* 0.04* 0.04* 0.03*    BNP: Invalid input(s): POCBNP  CBG: No results for input(s): GLUCAP in the last 168 hours.  Microbiology: Results for orders placed or performed during the hospital encounter of 06/09/17  Body fluid culture     Status: None (Preliminary result)   Collection Time: 06/09/17  3:48 PM  Result Value Ref Range Status   Specimen Description ASCITIC  Final   Special Requests NONE  Final   Gram Stain   Final    CYTOSPIN SMEAR WBC PRESENT, PREDOMINANTLY MONONUCLEAR NO ORGANISMS SEEN    Culture   Final    NO GROWTH < 12 HOURS Performed at [facility], [place] [address], [place],  [address]    Report Status PENDING  Incomplete  MRSA PCR Screening     Status:  None   Collection Time: 06/10/17  8:55 AM  Result Value Ref Range Status   MRSA by PCR NEGATIVE NEGATIVE Final    Comment:        The GeneXpert MRSA Assay (FDA approved for NASAL specimens only), is one component of a comprehensive MRSA colonization surveillance program. It is not intended to diagnose MRSA infection nor to guide or monitor treatment for MRSA infections.     Coagulation Studies: No results for input(s): LABPROT, INR in the last 72 hours.  Urinalysis: No results for input(s): COLORURINE, LABSPEC, PHURINE, GLUCOSEU, HGBUR, BILIRUBINUR, KETONESUR, PROTEINUR, UROBILINOGEN, NITRITE, LEUKOCYTESUR in the last 72 hours.  Invalid input(s): APPERANCEUR    Imaging: Dg Chest 2 View  Result Date: 06/09/2017 CLINICAL DATA:  Chest pain. EXAM: CHEST  2 VIEW COMPARISON:  Radiograph of January 14, 2017. FINDINGS: The heart size and mediastinal contours are within normal  limits. No pneumothorax or pleural effusion is noted. Mild central pulmonary vascular congestion is noted. No consolidative process is noted. The visualized skeletal structures are unremarkable. IMPRESSION: Mild central pulmonary vascular congestion. No other definite abnormality seen in the chest. Electronically Signed   By: Marijo Conception, M.D.   On: 06/09/2017 16:08     Medications:   . cefTRIAXone (ROCEPHIN)  IV Stopped (06/10/17 1238)   . amLODipine  10 mg Oral Daily  . B-complex with vitamin C  1 tablet Oral Daily  . calcium acetate  2,001 mg Oral TID WC  . cholestyramine  4 g Oral TID WC  . feeding supplement (NEPRO CARB STEADY)  237 mL Oral TID BM  . ferric citrate  420 mg Oral TID WC  . folic acid  1 mg Oral Daily  . heparin  5,000 Units Subcutaneous Q8H  . losartan  100 mg Oral Daily  . metoprolol tartrate  25 mg Oral BID  . [START ON 06/11/2017] multivitamin  15 mL Oral Once per day on Mon Thu  . pantoprazole  40 mg Oral Daily   acetaminophen **OR** acetaminophen, hydrALAZINE, HYDROcodone-acetaminophen, hydrOXYzine, LORazepam, morphine injection, ondansetron **OR** ondansetron (ZOFRAN) IV  Assessment/ Plan:  [name] is a 46 y.o. Hispanic (Spanish speaking only) male with end stage renal disease on hemodialysis, hypertension, hepatic alcoholic cirrhosis  [facility], [place]., Tuesday, Thursday, Saturday  1. End-stage renal disease: with hyperkalemia: Last treatment yesterday.  - Continue TTS schedule.   2. Spontaneous Bacterial Peritonitis: wbc 739 on 11/6 - ceftriaxone empirically - cultures pending.   3. Hypertension: elevated blood pressure  - amlodipine, losartan, metoprolol  4. Anemia of chronic kidney disease: Hemoglobin 12  5. Secondary Hyperparathyroidism: with hyperphosphatemia: phos 7.8 - restarted calcium acetate and Auryxia with meals.    LOS: Peter, Kaushik Maul 11/7/20183:28 PM

## 2017-06-11 ENCOUNTER — Inpatient Hospital Stay: Payer: Medicaid Other

## 2017-06-11 LAB — LACTATE DEHYDROGENASE, PLEURAL OR PERITONEAL FLUID: LD FL: 90 U/L — AB (ref 3–23)

## 2017-06-11 LAB — GLUCOSE, PLEURAL OR PERITONEAL FLUID: Glucose, Fluid: 108 mg/dL

## 2017-06-11 LAB — CBC
HEMATOCRIT: 35.4 % — AB (ref 40.0–52.0)
HEMOGLOBIN: 11.6 g/dL — AB (ref 13.0–18.0)
MCH: 31.6 pg (ref 26.0–34.0)
MCHC: 32.8 g/dL (ref 32.0–36.0)
MCV: 96.4 fL (ref 80.0–100.0)
PLATELETS: 130 10*3/uL — AB (ref 150–440)
RBC: 3.67 MIL/uL — AB (ref 4.40–5.90)
RDW: 15.3 % — ABNORMAL HIGH (ref 11.5–14.5)
WBC: 4.6 10*3/uL (ref 3.8–10.6)

## 2017-06-11 LAB — COMPREHENSIVE METABOLIC PANEL
ALT: 9 U/L — AB (ref 17–63)
AST: 15 U/L (ref 15–41)
Albumin: 3.3 g/dL — ABNORMAL LOW (ref 3.5–5.0)
Alkaline Phosphatase: 179 U/L — ABNORMAL HIGH (ref 38–126)
Anion gap: 13 (ref 5–15)
BUN: 35 mg/dL — AB (ref 6–20)
CHLORIDE: 96 mmol/L — AB (ref 101–111)
CO2: 25 mmol/L (ref 22–32)
CREATININE: 9.65 mg/dL — AB (ref 0.61–1.24)
Calcium: 8.9 mg/dL (ref 8.9–10.3)
GFR calc Af Amer: 7 mL/min — ABNORMAL LOW (ref >60)
GFR calc non Af Amer: 6 mL/min — ABNORMAL LOW (ref >60)
Glucose, Bld: 87 mg/dL (ref 65–99)
Potassium: 5.1 mmol/L (ref 3.5–5.1)
Sodium: 134 mmol/L — ABNORMAL LOW (ref 135–145)
Total Bilirubin: 0.7 mg/dL (ref 0.3–1.2)
Total Protein: 6.6 g/dL (ref 6.5–8.1)

## 2017-06-11 LAB — AMYLASE, PLEURAL OR PERITONEAL FLUID: AMYLASE FL: 156 U/L

## 2017-06-11 LAB — ALBUMIN, PLEURAL OR PERITONEAL FLUID: ALBUMIN FL: 2 g/dL

## 2017-06-11 LAB — PROTEIN, PLEURAL OR PERITONEAL FLUID: Total protein, fluid: 3.8 g/dL

## 2017-06-11 LAB — BODY FLUID CELL COUNT WITH DIFFERENTIAL
EOS FL: 0 %
Lymphs, Fluid: 25 %
MONOCYTE-MACROPHAGE-SEROUS FLUID: 50 %
Neutrophil Count, Fluid: 25 %
OTHER CELLS FL: 0 %
Total Nucleated Cell Count, Fluid: 1751 cu mm

## 2017-06-11 LAB — VITAMIN D 25 HYDROXY (VIT D DEFICIENCY, FRACTURES): Vit D, 25-Hydroxy: 24.9 ng/mL — ABNORMAL LOW (ref 30.0–100.0)

## 2017-06-11 NOTE — Progress Notes (Signed)
Pre hd 

## 2017-06-11 NOTE — Progress Notes (Signed)
ID E note No fevers,wbc. Still with pain per report. Cx still pending.  Rec If culture will neg tomorrow would dc on IV ceftazidime to be given with HD dosing for 10 days since admit- stop date 11/15

## 2017-06-11 NOTE — Progress Notes (Addendum)
HD completed without issue. Goal met. Patient tolerated well. Still complaining of pain. To go for paracentesis post HD. Ultrasound to be notified upon completion of HD

## 2017-06-11 NOTE — Progress Notes (Signed)
[facility]  ROUNDING NOTE   Subjective:   Mario [name] seen and examined on hemodialysis. Tolerating treatment well. UF goal of 2 liters.     HEMODIALYSIS FLOWSHEET:  Blood Flow Rate (mL/min): 400 mL/min Arterial Pressure (mmHg): -170 mmHg Venous Pressure (mmHg): 150 mmHg Transmembrane Pressure (mmHg): 70 mmHg Ultrafiltration Rate (mL/min): 670 mL/min Dialysate Flow Rate (mL/min): 600 ml/min Conductivity: Machine : 14 Conductivity: Machine : 14 Dialysis Fluid Bolus: Normal Saline Bolus Amount (mL): 250 mL Dialysate Change: 2K(k resulted 5.1 this am)  [name] is eating better. Continues to complain of abdominal pain.   Scheduled for therapeutic ultrasound guided paracentesis.   History taken with assistance of Spanish Interpreter.   Objective:  Vital signs in last 24 hours:  Temp:  [98.1 F (36.7 C)-98.3 F (36.8 C)] 98.1 F (36.7 C) (11/08 1253) Pulse Rate:  [60-67] 65 (11/08 1400) Resp:  [12-16] 16 (11/08 1400) BP: (132-144)/(70-75) 133/72 (11/08 1400) SpO2:  [96 %-100 %] 99 % (11/08 1330) Weight:  [75.3 kg (166 lb)] 75.3 kg (166 lb) (11/08 1253)  Weight change:  Filed Weights   06/09/17 1543 06/09/17 2055 06/11/17 1253  Weight: 68 kg (150 lb) 71.9 kg (158 lb 8 oz) 75.3 kg (166 lb)    Intake/Output: I/O last 3 completed shifts: In: 522 [P.O.:240; NG/GT:237; IV Piggyback:45] Out: 0    Intake/Output this shift:  Total I/O In: 285 [P.O.:240; IV Piggyback:45] Out: 0   Physical Exam: General: NAD, laying in bed  Head: Normocephalic, atraumatic. Moist oral mucosal membranes  Eyes: Anicteric, PERRL  Neck: Supple, trachea midline  Lungs:  Clear to auscultation  Heart: Regular rate and rhythm  Abdomen:  +distended, +tender to palpation  Extremities: trace peripheral edema.  Neurologic: Nonfocal, moving all four extremities  Skin: No lesions  Access: Left arm AVF    Basic Metabolic Panel: Recent Labs  Lab 06/09/17 1548 06/10/17 0356  06/10/17 1153 06/11/17 0423  NA 136 135  --  134*  K 4.0 4.9  --  5.1  CL 95* 96*  --  96*  CO2 26 27  --  25  GLUCOSE 83 82  --  87  BUN 25* 29*  --  35*  CREATININE 6.96* 7.99*  --  9.65*  CALCIUM 9.1 9.4  --  8.9  MG  --   --  2.5*  --   PHOS  --   --  7.8*  --     Liver Function Tests: Recent Labs  Lab 06/09/17 1548 06/11/17 0423  AST 25 15  ALT 12* 9*  ALKPHOS 204* 179*  BILITOT 0.9 0.7  PROT 7.4 6.6  ALBUMIN 3.5 3.3*   Recent Labs  Lab 06/09/17 1548  LIPASE 50   No results for input(s): AMMONIA in the last 168 hours.  CBC: Recent Labs  Lab 06/09/17 1548 06/10/17 0356 06/11/17 0423  WBC 4.5 3.4* 4.6  HGB 12.2* 12.0* 11.6*  HCT 37.1* 36.6* 35.4*  MCV 94.8 96.6 96.4  PLT 143* 116* 130*    Cardiac Enzymes: Recent Labs  Lab 06/09/17 1548 06/09/17 2221 06/10/17 0356 06/10/17 1002  TROPONINI 0.04* 0.04* 0.04* 0.03*    BNP: Invalid input(s): POCBNP  CBG: No results for input(s): GLUCAP in the last 168 hours.  Microbiology: Results for orders placed or performed during the hospital encounter of 06/09/17  Body fluid culture     Status: None (Preliminary result)   Collection Time: 06/09/17  3:48 PM  Result Value Ref Range Status  Specimen Description ASCITIC  Final   Special Requests NONE  Final   Gram Stain   Final    CYTOSPIN SMEAR WBC PRESENT, PREDOMINANTLY MONONUCLEAR NO ORGANISMS SEEN    Culture   Final    NO GROWTH 2 DAYS Performed at [facility], [address] [place], [place] [address]    Report Status PENDING  Incomplete  MRSA PCR Screening     Status: None   Collection Time: 06/10/17  8:55 AM  Result Value Ref Range Status   MRSA by PCR NEGATIVE NEGATIVE Final    Comment:        The GeneXpert MRSA Assay (FDA approved for NASAL specimens only), is one component of a comprehensive MRSA colonization surveillance program. It is not intended to diagnose MRSA infection nor to guide or monitor treatment for MRSA  infections.     Coagulation Studies: No results for input(s): LABPROT, INR in the last 72 hours.  Urinalysis: No results for input(s): COLORURINE, LABSPEC, PHURINE, GLUCOSEU, HGBUR, BILIRUBINUR, KETONESUR, PROTEINUR, UROBILINOGEN, NITRITE, LEUKOCYTESUR in the last 72 hours.  Invalid input(s): APPERANCEUR    Imaging: Dg Chest 2 View  Result Date: 06/09/2017 CLINICAL DATA:  Chest pain. EXAM: CHEST  2 VIEW COMPARISON:  Radiograph of January 14, 2017. FINDINGS: The heart size and mediastinal contours are within normal limits. No pneumothorax or pleural effusion is noted. Mild central pulmonary vascular congestion is noted. No consolidative process is noted. The visualized skeletal structures are unremarkable. IMPRESSION: Mild central pulmonary vascular congestion. No other definite abnormality seen in the chest. Electronically Signed   By: Marijo Conception, M.D.   On: 06/09/2017 16:08     Medications:   . cefTRIAXone (ROCEPHIN)  IV Stopped (06/11/17 1049)   . amLODipine  10 mg Oral Daily  . B-complex with vitamin C  1 tablet Oral Daily  . calcium acetate  2,001 mg Oral TID WC  . cholestyramine  4 g Oral TID WC  . feeding supplement (NEPRO CARB STEADY)  237 mL Oral TID BM  . ferric citrate  420 mg Oral TID WC  . folic acid  1 mg Oral Daily  . heparin  5,000 Units Subcutaneous Q8H  . losartan  100 mg Oral Daily  . metoprolol tartrate  25 mg Oral BID  . multivitamin  15 mL Oral Once per day on Mon Thu  . pantoprazole  40 mg Oral Daily   acetaminophen **OR** acetaminophen, hydrALAZINE, HYDROcodone-acetaminophen, hydrOXYzine, LORazepam, morphine injection, ondansetron **OR** ondansetron (ZOFRAN) IV  Assessment/ Plan:  Mario [name] is a 46 y.o. Hispanic (Spanish speaking only) male with end stage renal disease on hemodialysis, hypertension, hepatic alcoholic cirrhosis  [facility], [place]., Tuesday, Thursday, Saturday  1. End-stage renal disease: with hyperkalemia:  seen and examined on hemodialysis. Tolerating treatment well.  - Continue TTS schedule.   2. Spontaneous Bacterial Peritonitis: wbc 739 on 11/6 - ceftriaxone empirically - cultures with no growth - therapeutic ultrasound guided paracentesis scheduled.   3. Hypertension: blood pressure at goal - amlodipine, losartan, metoprolol  4. Anemia of chronic kidney disease: Hemoglobin 11.6 - EPO as outpatient.   5. Secondary Hyperparathyroidism: with hyperphosphatemia: phos 7.8 - calcium acetate and Auryxia with meals.    LOS: [place], Virgie Chery 11/8/20182:09 PM

## 2017-06-11 NOTE — Progress Notes (Signed)
HD initiated via L AVF without issue. Patient currently complains of mild abdominal discomfort still. No heparin tx. Continue to monitor closely

## 2017-06-11 NOTE — Progress Notes (Signed)
Pre HD  

## 2017-06-11 NOTE — Care Management (Signed)
Chronic HD at [facility]  Admitted with SBP.  may need IV antibiotics at discharge with his dialysis treatments.  Elvera Bicker with [employer]

## 2017-06-11 NOTE — Progress Notes (Signed)
[place] [facility] [place] NAME: Mario [name]    MR#:  [id number]  DATE OF BIRTH:  10/11/1970  SUBJECTIVE:  CHIEF COMPLAINT:   Chief Complaint  Patient presents with  . Chest Pain  continues to have pain,wants to eat, Spanish interpreter at bedside REVIEW OF SYSTEMS:  Review of Systems  Constitutional: Negative for chills, fever and weight loss.  HENT: Negative for nosebleeds and sore throat.   Eyes: Negative for blurred vision.  Respiratory: Negative for cough, shortness of breath and wheezing.   Cardiovascular: Negative for chest pain, orthopnea, leg swelling and PND.  Gastrointestinal: Positive for abdominal pain and nausea. Negative for constipation, diarrhea, heartburn and vomiting.  Genitourinary: Negative for dysuria and urgency.  Musculoskeletal: Negative for back pain.  Skin: Negative for rash.  Neurological: Negative for dizziness, speech change, focal weakness and headaches.  Endo/Heme/Allergies: Does not bruise/bleed easily.  Psychiatric/Behavioral: Negative for depression.    DRUG ALLERGIES:   Allergies  Allergen Reactions  . Betadine [Povidone Iodine] Rash   VITALS:  Blood pressure (!) 151/69, pulse 60, temperature 98.1 F (36.7 C), temperature source Oral, resp. rate 20, height 5\' 2"  (1.575 m), weight 75.3 kg (166 lb), SpO2 99 %. PHYSICAL EXAMINATION:  Physical Exam  Constitutional: He is oriented to person, place, and time and well-developed, well-nourished, and in no distress.  HENT:  Head: Normocephalic and atraumatic.  Eyes: Conjunctivae and EOM are normal. Pupils are equal, round, and reactive to light.  Neck: Normal range of motion. Neck supple. No tracheal deviation present. No thyromegaly present.  Cardiovascular: Normal rate, regular rhythm and normal heart sounds.  Pulmonary/Chest: Effort normal and breath sounds normal. No respiratory distress. He has no wheezes. He exhibits no tenderness.    Abdominal: Soft. Bowel sounds are normal. He exhibits no distension. There is generalized tenderness.  Musculoskeletal: Normal range of motion.  Neurological: He is alert and oriented to person, place, and time. No cranial nerve deficit.  Skin: Skin is warm and dry. No rash noted.  Psychiatric: Mood and affect normal.   LABORATORY PANEL:  Male CBC Recent Labs  Lab 06/11/17 0423  WBC 4.6  HGB 11.6*  HCT 35.4*  PLT 130*   ------------------------------------------------------------------------------------------------------------------ Chemistries  Recent Labs  Lab 06/10/17 1153 06/11/17 0423  NA  --  134*  K  --  5.1  CL  --  96*  CO2  --  25  GLUCOSE  --  87  BUN  --  35*  CREATININE  --  9.65*  CALCIUM  --  8.9  MG 2.5*  --   AST  --  15  ALT  --  9*  ALKPHOS  --  179*  BILITOT  --  0.7   RADIOLOGY:  No results found. ASSESSMENT AND PLAN:  Patient is a 46 year old with liver cirrhosis and end-stage renal disease with abdominal pain  1.  Spontaneous bacterial peritonitis: Based on clinical symptoms and body fluid analysis - Continue IV Rocephin  - Appreciate Dr. Ola Spurr input  * Ascites - US guided paracentesis  2.  End-stage renal disease Nephrology following for hemodialysis need  3.  Essential hypertension Continue amlodipine, Cozaar, metoprolol  4.  Chest pain atypical in nature.  Now resolved  5. Liver cirrhosis supportive care  6.  Heparin for DVT prophylaxis      All the records are reviewed and case discussed with Care Management/Social Worker. Management plans  discussed with the patient, nursing and they are in agreement.  CODE STATUS: Full Code  TOTAL TIME TAKING CARE OF THIS PATIENT: 25 minutes.   More than 50% of the time was spent in counseling/coordination of care: YES  POSSIBLE D/C IN 1-2 DAYS, DEPENDING ON CLINICAL CONDITION.   Max Sane M.D on 06/11/2017 at 2:38 PM  Between 7am to 6pm - Pager -  [phone]  After 6pm go to [email or web address] - Proofreader  [facility]  Office  [phone]  CC: Primary care physician; [name], MD  Note: This dictation was prepared with Dragon dictation along with smaller phrase technology. Any transcriptional errors that result from this process are unintentional.

## 2017-06-11 NOTE — Progress Notes (Signed)
Post HD assessment unchanged  

## 2017-06-12 LAB — CBC
HCT: 35 % — ABNORMAL LOW (ref 40.0–52.0)
HEMOGLOBIN: 11.3 g/dL — AB (ref 13.0–18.0)
MCH: 31.1 pg (ref 26.0–34.0)
MCHC: 32.3 g/dL (ref 32.0–36.0)
MCV: 96.1 fL (ref 80.0–100.0)
PLATELETS: 130 10*3/uL — AB (ref 150–440)
RBC: 3.65 MIL/uL — ABNORMAL LOW (ref 4.40–5.90)
RDW: 14.9 % — ABNORMAL HIGH (ref 11.5–14.5)
WBC: 4.6 10*3/uL (ref 3.8–10.6)

## 2017-06-12 LAB — BASIC METABOLIC PANEL
Anion gap: 13 (ref 5–15)
BUN: 24 mg/dL — AB (ref 6–20)
CHLORIDE: 95 mmol/L — AB (ref 101–111)
CO2: 27 mmol/L (ref 22–32)
Calcium: 8.7 mg/dL — ABNORMAL LOW (ref 8.9–10.3)
Creatinine, Ser: 7.32 mg/dL — ABNORMAL HIGH (ref 0.61–1.24)
GFR calc Af Amer: 9 mL/min — ABNORMAL LOW (ref >60)
GFR calc non Af Amer: 8 mL/min — ABNORMAL LOW (ref >60)
Glucose, Bld: 109 mg/dL — ABNORMAL HIGH (ref 65–99)
Potassium: 4.4 mmol/L (ref 3.5–5.1)
SODIUM: 135 mmol/L (ref 135–145)

## 2017-06-12 LAB — TRIGLYCERIDES, BODY FLUIDS: TRIGLYCERIDES FL: 41 mg/dL

## 2017-06-12 LAB — MISC LABCORP TEST (SEND OUT): LABCORP TEST CODE: 1479

## 2017-06-12 LAB — VITAMIN B1: Vitamin B1 (Thiamine): 87.9 nmol/L (ref 66.5–200.0)

## 2017-06-12 MED ORDER — DEXTROSE 5 % IV SOLN: 2.0000 g | INTRAVENOUS | 0 refills | Status: DC

## 2017-06-12 MED ORDER — PROMETHAZINE HCL 25 MG/ML IJ SOLN
12.5000 mg | Freq: Once | INTRAMUSCULAR | Status: AC
  Administered 2017-06-12: 12.5 mg via INTRAMUSCULAR
  Filled 2017-06-12: qty 1

## 2017-06-12 MED ORDER — DEXTROSE 5 % IV SOLN
2.0000 g | INTRAVENOUS | Status: DC
  Filled 2017-06-12: qty 2

## 2017-06-12 MED ORDER — PROMETHAZINE HCL 25 MG/ML IJ SOLN: 12.5000 mg | Freq: Three times a day (TID) | INTRAMUSCULAR | Status: DC | PRN

## 2017-06-12 MED ORDER — NEPRO/CARBSTEADY PO LIQD: 237.0000 mL | Freq: Three times a day (TID) | ORAL | 0 refills | Status: DC

## 2017-06-12 MED ORDER — ADULT MULTIVITAMIN LIQUID CH: 15.0000 mL | ORAL | 1 refills | Status: DC

## 2017-06-12 NOTE — Progress Notes (Signed)
x3 episodes of large amounts of emesis/ Dr. Posey Pronto paged to make aware/ will give nausea medication and assess effectiveness

## 2017-06-12 NOTE — Discharge Summary (Addendum)
[facility] NAME: Mario [name]    MR#:  [id number]  DATE OF BIRTH:  05/26/1971  DATE OF ADMISSION:  06/09/2017 ADMITTING PHYSICIAN: Dustin Flock, MD  DATE OF DISCHARGE: 06/12/17  PRIMARY CARE PHYSICIAN: [name], MD    ADMISSION DIAGNOSIS:  SBP (spontaneous bacterial peritonitis) ([place]) [K65.2]  DISCHARGE DIAGNOSIS:  Spontaneous bacterial peritonitis--- she to get IV antibiotics at dialysis Tuesday Thursday Saturday.  Last dose of antibiotic on 06/18/2017 Recurrent ascites status post paracentesis 4 L removed on 06/11/2017 SECONDARY DIAGNOSIS:   Past Medical History:  Diagnosis Date  . Abdominal pain 08/28/2016  . Acute hyperkalemia 10/28/2016  . Chest pain 04/11/2016  . Chronic combined systolic and diastolic CHF (congestive heart failure) ([place])   . Cirrhosis ([place])   . Dialysis patient ([place])   . ESRD (end stage renal disease) on dialysis ([place]) 01/25/2015  . Fluid overload 04/12/2016  . HTN (hypertension) 10/13/2016  . Hyperkalemia 11/27/2016  . Hypertension   . Pulmonary edema 07/03/2016  . Renal disorder   . Renal insufficiency   . Scrotal infection 01/25/2015    HOSPITAL COURSE:   46 year old with liver cirrhosis and end-stage renal disease with abdominal pain  1.Spontaneous bacterial peritonitis: Based on clinical symptoms and body fluid analysis - Continue IV Rocephin ---changed to IV ceftazidime at dialysis Tuesday Thursday Saturday last dose 06/18/2017 - Appreciate Dr. Ola Spurr input -Body fluid cultures negative  *recurrent ascites - US guided paracentesis--- removed 4 L on 06/11/2017  2.End-stage renal disease Nephrology following for hemodialysis need  3.Essential hypertension Continue amlodipine, metoprolol -Patient not taking Cozaar at home causes lot of itching.  I have discontinued Cozaar for now  4.Chest pain atypical in nature.  Now resolved  5. Livercirrhosis  supportive care  6.Heparin for DVT prophylaxis  Overall hemodynamically stable.  Patient will discharge home. Above all was discussed Via Spanish interpreter CONSULTS OBTAINED:  Treatment Team:  Lavonia Dana, MD Leonel Ramsay, MD  DRUG ALLERGIES:   Allergies  Allergen Reactions  . Betadine [Povidone Iodine] Rash    DISCHARGE MEDICATIONS:   Current Discharge Medication List    START taking these medications   Details  cefTAZidime 2 g in dextrose 5 % 50 mL Inject 2 g Every Tuesday,Thursday,and Saturday with dialysis into the vein. Qty: 4 application, Refills: 0    Multiple Vitamin (MULTIVITAMIN) LIQD Take 15 mLs 2 (two) times a week by mouth. Qty: 1 Bottle, Refills: 1      CONTINUE these medications which have NOT CHANGED   Details  metoprolol tartrate (LOPRESSOR) 25 MG tablet Take 1 tablet (25 mg total) by mouth 2 (two) times daily. Qty: 60 tablet, Refills: 0    amLODipine (NORVASC) 10 MG tablet Take 1 tablet (10 mg total) by mouth daily. Qty: 30 tablet, Refills: 0    calcium acetate (PHOSLO) 667 MG tablet Take 3 tablets (2,001 mg total) by mouth 3 (three) times daily with meals.    cholestyramine (QUESTRAN) 4 g packet Take 1 packet (4 g total) by mouth 3 (three) times daily with meals. Qty: 60 each, Refills: 12    ciprofloxacin (CIPRO) 500 MG tablet Take 1 tablet (500 mg total) by mouth at bedtime. Qty: 10 tablet, Refills: 0    ferric citrate (AURYXIA) 1 GM 210 MG(Fe) tablet Take 420 mg by mouth 3 (three) times daily with meals.    hydrOXYzine (ATARAX/VISTARIL) 25 MG tablet Take 1 tablet (25 mg total) by mouth 3 (  three) times daily as needed for itching. Qty: 60 tablet, Refills: 0    LORazepam (ATIVAN) 0.5 MG tablet Take 0.5mg  tablet prior to dialysis for anxiety if needed    ondansetron (ZOFRAN ODT) 4 MG disintegrating tablet Take 1 tablet (4 mg total) by mouth every 8 (eight) hours as needed for nausea or vomiting. Qty: 15 tablet, Refills: 0     pantoprazole (PROTONIX) 40 MG tablet Take 1 tablet (40 mg total) by mouth daily. Qty: 30 tablet, Refills: 0      STOP taking these medications     losartan (COZAAR) 100 MG tablet      predniSONE (DELTASONE) 10 MG tablet         If you experience worsening of your admission symptoms, develop shortness of breath, life threatening emergency, suicidal or homicidal thoughts you must seek medical attention immediately by calling 911 or calling your MD immediately  if symptoms less severe.  You Must read complete instructions/literature along with all the possible adverse reactions/side effects for all the Medicines you take and that have been prescribed to you. Take any new Medicines after you have completely understood and accept all the possible adverse reactions/side effects.   Please note  You were cared for by a hospitalist during your hospital stay. If you have any questions about your discharge medications or the care you received while you were in the hospital after you are discharged, you can call the unit and asked to speak with the hospitalist on call if the hospitalist that took care of you is not available. Once you are discharged, your primary care physician will handle any further medical issues. Please note that NO REFILLS for any discharge medications will be authorized once you are discharged, as it is imperative that you return to your primary care physician (or establish a relationship with a primary care physician if you do not have one) for your aftercare needs so that they can reassess your need for medications and monitor your lab values. Today   SUBJECTIVE  Denies any complaint  Wants to eat biscuits VITAL SIGNS:  Blood pressure (!) 146/68, pulse 64, temperature 97.8 F (36.6 C), temperature source Oral, resp. rate 17, height 5\' 2"  (1.575 m), weight 74 kg (163 lb 2.3 oz), SpO2 100 %.  I/O:    Intake/Output Summary (Last 24 hours) at 06/12/2017 1155 Last data  filed at 06/11/2017 2134 Gross per 24 hour  Intake 234 ml  Output 1500 ml  Net -1266 ml    PHYSICAL EXAMINATION:  GENERAL:  46 y.o.-year-old patient lying in the bed with no acute distress.  Chronically ill EYES: Pupils equal, round, reactive to light and accommodation. No scleral icterus. Extraocular muscles intact.  HEENT: Head atraumatic, normocephalic. Oropharynx and nasopharynx clear.  NECK:  Supple, no jugular venous distention. No thyroid enlargement, no tenderness.  LUNGS: Decreased breath sounds bilaterally, no wheezing, rales,rhonchi or crepitation. No use of accessory muscles of respiration.  CARDIOVASCULAR: S1, S2 normal. No murmurs, rubs, or gallops.  ABDOMEN: Soft, non-tender, mildly distended. Bowel sounds present. No organomegaly or mass.  EXTREMITIES: No pedal edema, cyanosis, or clubbing.  NEUROLOGIC: Cranial nerves II through XII are intact. Muscle strength 5/5 in all extremities. Sensation intact. Gait not checked.  PSYCHIATRIC: The patient is alert and oriented x 3.  SKIN: No obvious rash, lesion, or ulcer.   DATA REVIEW:   CBC  Recent Labs  Lab 06/12/17 0351  WBC 4.6  HGB 11.3*  HCT 35.0*  PLT 130*  Chemistries  Recent Labs  Lab 06/10/17 1153 06/11/17 0423 06/12/17 0351  NA  --  134* 135  K  --  5.1 4.4  CL  --  96* 95*  CO2  --  25 27  GLUCOSE  --  87 109*  BUN  --  35* 24*  CREATININE  --  9.65* 7.32*  CALCIUM  --  8.9 8.7*  MG 2.5*  --   --   AST  --  15  --   ALT  --  9*  --   ALKPHOS  --  179*  --   BILITOT  --  0.7  --     Microbiology Results   Recent Results (from the past 240 hour(s))  Body fluid culture     Status: None (Preliminary result)   Collection Time: 06/09/17  3:48 PM  Result Value Ref Range Status   Specimen Description ASCITIC  Final   Special Requests NONE  Final   Gram Stain   Final    CYTOSPIN SMEAR WBC PRESENT, PREDOMINANTLY MONONUCLEAR NO ORGANISMS SEEN    Culture   Final    NO GROWTH 3 DAYS Performed  at [facility], [address], [place], [place] [address]    Report Status PENDING  Incomplete  MRSA PCR Screening     Status: None   Collection Time: 06/10/17  8:55 AM  Result Value Ref Range Status   MRSA by PCR NEGATIVE NEGATIVE Final    Comment:        The GeneXpert MRSA Assay (FDA approved for NASAL specimens only), is one component of a comprehensive MRSA colonization surveillance program. It is not intended to diagnose MRSA infection nor to guide or monitor treatment for MRSA infections.   Body fluid culture     Status: None (Preliminary result)   Collection Time: 06/11/17  4:32 PM  Result Value Ref Range Status   Specimen Description PERITONEAL  Final   Special Requests NONE  Final   Gram Stain   Final    RARE WBC PRESENT, PREDOMINANTLY PMN NO ORGANISMS SEEN    Culture   Final    NO GROWTH < 12 HOURS Performed at [facility] [address] [place], [place] [address]    Report Status PENDING  Incomplete    RADIOLOGY:  US Paracentesis  Result Date: 06/12/2017 INDICATION: Recurrent symptomatic ascites. Please perform ultrasound-guided paracentesis for diagnostic and therapeutic purposes. EXAM: ULTRASOUND-GUIDED PARACENTESIS COMPARISON:  None. MEDICATIONS: None. COMPLICATIONS: None immediate. TECHNIQUE: Informed written consent was obtained from the patient after a discussion of the risks, benefits and alternatives to treatment. A timeout was performed prior to the initiation of the procedure. Initial ultrasound scanning demonstrates a moderate amount of ascites within the right lower abdominal quadrant. The right lower abdomen was prepped and draped in the usual sterile fashion. 1% lidocaine with epinephrine was used for local anesthesia. An ultrasound image was saved for documentation purposed. An 8 Fr Safe-T-Centesis catheter was introduced. The paracentesis was initiated however after the aspiration of approximately 500 cc of ascitic fluid, the  Safe-T-Centesis catheter became apposed against a wall of bowel and could not be freed. As such, the Safe-T-Centesis catheter was removed and under direct ultrasound guidance, a 19 gauge, 7-cm, Yueh catheter was introduced. The paracentesis was completed. The catheter was removed and a dressing was applied. The patient tolerated the procedure well without immediate post procedural complication. FINDINGS: A total of approximately 4.5 liters of serous fluid  was removed. IMPRESSION: Successful ultrasound-guided paracentesis yielding 4.5 liters of peritoneal fluid. Electronically Signed   By: Sandi Mariscal M.D.   On: 06/12/2017 07:52     Management plans discussed with the patient, family and they are in agreement.  CODE STATUS:     Code Status Orders  (From admission, onward)        Start     Ordered   06/09/17 2048  Full code  Continuous     06/09/17 2047    Code Status History    Date Active Date Inactive Code Status Order ID Comments User Context   01/06/2017 14:37 01/07/2017 17:44 Full Code [id number]  Epifanio Lesches, MD ED   11/27/2016 14:31 11/30/2016 14:23 Full Code [id number]  Henreitta Leber, MD Inpatient   10/28/2016 16:11 10/31/2016 16:38 Full Code [id number]  Epifanio Lesches, MD ED   10/13/2016 21:36 10/14/2016 21:23 Full Code [id number]  Lance Coon, MD Inpatient   08/28/2016 16:58 08/30/2016 17:19 Full Code [id number]  Fritzi Mandes, MD ED   07/03/2016 20:06 07/06/2016 16:22 Full Code [id number]  Fritzi Mandes, MD ED   04/11/2016 18:57 04/12/2016 07:16 Full Code [id number]  Vaughan Basta, MD Inpatient   01/25/2015 12:29 01/28/2015 16:26 Full Code [id number]  Dustin Flock, MD Inpatient      TOTAL TIME TAKING CARE OF THIS PATIENT: *40* minutes.    Carmell Elgin M.D on 06/12/2017 at 11:55 AM  Between 7am to 6pm - Pager - [phone] After 6pm go to [email or web address] - Proofreader  [facility]  Office  [phone]  CC: Primary care physician; [name], MD

## 2017-06-12 NOTE — Care Management (Signed)
Patient admitted with Spontaneous bacterial peritonitis.  Patient to discharge with IV antibiotics given at HD.  [facility] liasion notified of discharge.  No other new scripts at discharge.  Patient provided with Spanish application to [facility], Medication Management, and Spanish "The Network:  Your Guide to [employer] and [employer] in [facility]"  Booklet.  RNCM signing off.

## 2017-06-12 NOTE — Progress Notes (Signed)
Nutrition Brief Note   Pt's ascorbic acid labs returned today; pt with scurvy. MD Kolluru notified. Will plan for IV supplementation as outpatient.   Koleen Distance MS, RD, LDN Pager #- [phone] After Hours Pager: [phone]

## 2017-06-12 NOTE — Research (Signed)
Via interpreter: patient reports only two medications - blood pressure (presumably METOPROLOL) and a "brown" pill he does not know. Patient uses multiple locations for medications - [facility] and [facility]. I will attempt to contact both locations to ascertain the identity of this "brown" pill.

## 2017-06-12 NOTE — Procedures (Signed)
Pre Procedural Dx: Symptomatic Ascites Post Procedural Dx: Same  Successful US guided paracentesis yielding 4.5 L of serous ascitic fluid. Sample sent to laboratory as requested.  EBL: None  Complications: None immediate  Ronny Bacon, MD Pager #: [phone]

## 2017-06-12 NOTE — Discharge Instructions (Signed)
Resume your dialysis Tuesday Thursday Saturday

## 2017-06-12 NOTE — Progress Notes (Signed)
[facility]  ROUNDING NOTE   Subjective:   Hemodialysis treatment yesterday. Tolerated treatment well. UF of 1.5.   Paracentesis yesterday. 4.5 liters removed.   History taken with assistance of Spanish Interpreter.   Objective:  Vital signs in last 24 hours:  Temp:  [97.8 F (36.6 C)-98.8 F (37.1 C)] 97.8 F (36.6 C) (11/09 0419) Pulse Rate:  [60-73] 64 (11/09 0814) Resp:  [12-35] 17 (11/09 0814) BP: (131-160)/(60-79) 146/68 (11/09 0814) SpO2:  [95 %-100 %] 100 % (11/09 0814) Weight:  [74 kg (163 lb 2.3 oz)-75.3 kg (166 lb)] 74 kg (163 lb 2.3 oz) (11/08 1551)  Weight change:  Filed Weights   06/09/17 2055 06/11/17 1253 06/11/17 1551  Weight: 71.9 kg (158 lb 8 oz) 75.3 kg (166 lb) 74 kg (163 lb 2.3 oz)    Intake/Output: I/O last 3 completed shifts: In: 952 [P.O.:474; NG/GT:237; IV Piggyback:45] Out: 1500 [Other:1500]   Intake/Output this shift:  No intake/output data recorded.  Physical Exam: General: NAD, laying in bed  Head: Normocephalic, atraumatic. Moist oral mucosal membranes  Eyes: Anicteric, PERRL  Neck: Supple, trachea midline  Lungs:  Clear to auscultation  Heart: Regular rate and rhythm  Abdomen:  soft  Extremities: trace peripheral edema.  Neurologic: Nonfocal, moving all four extremities  Skin: No lesions  Access: Left arm AVF    Basic Metabolic Panel: Recent Labs  Lab 06/09/17 1548 06/10/17 0356 06/10/17 1153 06/11/17 0423 06/12/17 0351  NA 136 135  --  134* 135  K 4.0 4.9  --  5.1 4.4  CL 95* 96*  --  96* 95*  CO2 26 27  --  25 27  GLUCOSE 83 82  --  87 109*  BUN 25* 29*  --  35* 24*  CREATININE 6.96* 7.99*  --  9.65* 7.32*  CALCIUM 9.1 9.4  --  8.9 8.7*  MG  --   --  2.5*  --   --   PHOS  --   --  7.8*  --   --     Liver Function Tests: Recent Labs  Lab 06/09/17 1548 06/11/17 0423  AST 25 15  ALT 12* 9*  ALKPHOS 204* 179*  BILITOT 0.9 0.7  PROT 7.4 6.6  ALBUMIN 3.5 3.3*   Recent Labs  Lab 06/09/17 1548   LIPASE 50   No results for input(s): AMMONIA in the last 168 hours.  CBC: Recent Labs  Lab 06/09/17 1548 06/10/17 0356 06/11/17 0423 06/12/17 0351  WBC 4.5 3.4* 4.6 4.6  HGB 12.2* 12.0* 11.6* 11.3*  HCT 37.1* 36.6* 35.4* 35.0*  MCV 94.8 96.6 96.4 96.1  PLT 143* 116* 130* 130*    Cardiac Enzymes: Recent Labs  Lab 06/09/17 1548 06/09/17 2221 06/10/17 0356 06/10/17 1002  TROPONINI 0.04* 0.04* 0.04* 0.03*    BNP: Invalid input(s): POCBNP  CBG: No results for input(s): GLUCAP in the last 168 hours.  Microbiology: Results for orders placed or performed during the hospital encounter of 06/09/17  Body fluid culture     Status: None (Preliminary result)   Collection Time: 06/09/17  3:48 PM  Result Value Ref Range Status   Specimen Description ASCITIC  Final   Special Requests NONE  Final   Gram Stain   Final    CYTOSPIN SMEAR WBC PRESENT, PREDOMINANTLY MONONUCLEAR NO ORGANISMS SEEN    Culture   Final    NO GROWTH 3 DAYS Performed at [facility], [place] [address] [place], [place] [address]  Report Status PENDING  Incomplete  MRSA PCR Screening     Status: None   Collection Time: 06/10/17  8:55 AM  Result Value Ref Range Status   MRSA by PCR NEGATIVE NEGATIVE Final    Comment:        The GeneXpert MRSA Assay (FDA approved for NASAL specimens only), is one component of a comprehensive MRSA colonization surveillance program. It is not intended to diagnose MRSA infection nor to guide or monitor treatment for MRSA infections.   Body fluid culture     Status: None (Preliminary result)   Collection Time: 06/11/17  4:32 PM  Result Value Ref Range Status   Specimen Description PERITONEAL  Final   Special Requests NONE  Final   Gram Stain   Final    RARE WBC PRESENT, PREDOMINANTLY PMN NO ORGANISMS SEEN    Culture   Final    NO GROWTH < 12 HOURS Performed at [facility] [address], [place], [place] [address]    Report Status PENDING   Incomplete    Coagulation Studies: No results for input(s): LABPROT, INR in the last 72 hours.  Urinalysis: No results for input(s): COLORURINE, LABSPEC, PHURINE, GLUCOSEU, HGBUR, BILIRUBINUR, KETONESUR, PROTEINUR, UROBILINOGEN, NITRITE, LEUKOCYTESUR in the last 72 hours.  Invalid input(s): APPERANCEUR    Imaging: US Paracentesis  Result Date: 06/12/2017 INDICATION: Recurrent symptomatic ascites. Please perform ultrasound-guided paracentesis for diagnostic and therapeutic purposes. EXAM: ULTRASOUND-GUIDED PARACENTESIS COMPARISON:  None. MEDICATIONS: None. COMPLICATIONS: None immediate. TECHNIQUE: Informed written consent was obtained from the patient after a discussion of the risks, benefits and alternatives to treatment. A timeout was performed prior to the initiation of the procedure. Initial ultrasound scanning demonstrates a moderate amount of ascites within the right lower abdominal quadrant. The right lower abdomen was prepped and draped in the usual sterile fashion. 1% lidocaine with epinephrine was used for local anesthesia. An ultrasound image was saved for documentation purposed. An 8 Fr Safe-T-Centesis catheter was introduced. The paracentesis was initiated however after the aspiration of approximately 500 cc of ascitic fluid, the Safe-T-Centesis catheter became apposed against a wall of bowel and could not be freed. As such, the Safe-T-Centesis catheter was removed and under direct ultrasound guidance, a 19 gauge, 7-cm, Yueh catheter was introduced. The paracentesis was completed. The catheter was removed and a dressing was applied. The patient tolerated the procedure well without immediate post procedural complication. FINDINGS: A total of approximately 4.5 liters of serous fluid was removed. IMPRESSION: Successful ultrasound-guided paracentesis yielding 4.5 liters of peritoneal fluid. Electronically Signed   By: Sandi Mariscal M.D.   On: 06/12/2017 07:52     Medications:   . [START ON  06/13/2017] cefTAZidime (FORTAZ)  IV     . amLODipine  10 mg Oral Daily  . B-complex with vitamin C  1 tablet Oral Daily  . calcium acetate  2,001 mg Oral TID WC  . cholestyramine  4 g Oral TID WC  . feeding supplement (NEPRO CARB STEADY)  237 mL Oral TID BM  . ferric citrate  420 mg Oral TID WC  . folic acid  1 mg Oral Daily  . heparin  5,000 Units Subcutaneous Q8H  . losartan  100 mg Oral Daily  . metoprolol tartrate  25 mg Oral BID  . multivitamin  15 mL Oral Once per day on Mon Thu  . pantoprazole  40 mg Oral Daily   acetaminophen **OR** acetaminophen, hydrALAZINE, HYDROcodone-acetaminophen, hydrOXYzine, LORazepam, ondansetron **OR** ondansetron (ZOFRAN) IV  Assessment/ Plan:  Mr. Murle Otting is a 46 y.o. Hispanic (Spanish speaking only) male with end stage renal disease on hemodialysis, hypertension, hepatic alcoholic cirrhosis  [facility], [place] Tuesday, Thursday, Saturday  1. End-stage renal disease: with hyperkalemia:  Hemodialysis treatment yesterday. Tolerated treatment well.  - Continue TTS schedule.   2. Spontaneous Bacterial Peritonitis: wbc 739 on 11/6 - status post large volume paracentesis 4.5 liters on 11/8 - Ceftazidime 2g with HD treatment until 11/15.   3. Hypertension: blood pressure at goal - amlodipine, losartan, metoprolol  4. Anemia of chronic kidney disease: Hemoglobin 11.3 - EPO as outpatient.   5. Secondary Hyperparathyroidism: with hyperphosphatemia: phos 7.8 - calcium acetate and Auryxia with meals.    LOS: [place], Linus Weckerly 11/9/201811:15 AM

## 2017-06-13 LAB — CBC
HEMATOCRIT: 35.7 % — AB (ref 40.0–52.0)
Hemoglobin: 11.6 g/dL — ABNORMAL LOW (ref 13.0–18.0)
MCH: 31.1 pg (ref 26.0–34.0)
MCHC: 32.5 g/dL (ref 32.0–36.0)
MCV: 95.9 fL (ref 80.0–100.0)
PLATELETS: 129 10*3/uL — AB (ref 150–440)
RBC: 3.72 MIL/uL — AB (ref 4.40–5.90)
RDW: 15.3 % — ABNORMAL HIGH (ref 11.5–14.5)
WBC: 5 10*3/uL (ref 3.8–10.6)

## 2017-06-13 LAB — RENAL FUNCTION PANEL
ANION GAP: 13 (ref 5–15)
Albumin: 3.1 g/dL — ABNORMAL LOW (ref 3.5–5.0)
BUN: 35 mg/dL — ABNORMAL HIGH (ref 6–20)
CHLORIDE: 93 mmol/L — AB (ref 101–111)
CO2: 27 mmol/L (ref 22–32)
Calcium: 9.1 mg/dL (ref 8.9–10.3)
Creatinine, Ser: 9.48 mg/dL — ABNORMAL HIGH (ref 0.61–1.24)
GFR, EST AFRICAN AMERICAN: 7 mL/min — AB (ref >60)
GFR, EST NON AFRICAN AMERICAN: 6 mL/min — AB (ref >60)
Glucose, Bld: 122 mg/dL — ABNORMAL HIGH (ref 65–99)
PHOSPHORUS: 5.9 mg/dL — AB (ref 2.5–4.6)
Potassium: 4.6 mmol/L (ref 3.5–5.1)
Sodium: 133 mmol/L — ABNORMAL LOW (ref 135–145)

## 2017-06-13 LAB — BODY FLUID CULTURE: CULTURE: NO GROWTH

## 2017-06-13 LAB — GLUCOSE, CAPILLARY: Glucose-Capillary: 119 mg/dL — ABNORMAL HIGH (ref 65–99)

## 2017-06-13 NOTE — Progress Notes (Signed)
Primary nurse paged and spoke to Dr. Juleen China in regard to pt dialysis fistula having swelling. Dr. Juleen China informed primary nurse that they will assess when the bring pt down to dialysis this AM.

## 2017-06-13 NOTE — Progress Notes (Signed)
Per Dr. Posey Pronto okay to place discharge order as pt is back from dialysis.

## 2017-06-13 NOTE — Progress Notes (Signed)
This note also relates to the following rows which could not be included: Pulse Rate - Cannot attach notes to unvalidated device data Resp - Cannot attach notes to unvalidated device data BP - Cannot attach notes to unvalidated device data  HD COMPLETED  

## 2017-06-13 NOTE — Progress Notes (Signed)
[facility]  ROUNDING NOTE   Subjective:   Seen and examined on hemodialysis. Tolerating treatment well.     HEMODIALYSIS FLOWSHEET:  Blood Flow Rate (mL/min): 350 mL/min Arterial Pressure (mmHg): -150 mmHg Venous Pressure (mmHg): 150 mmHg Transmembrane Pressure (mmHg): 50 mmHg Ultrafiltration Rate (mL/min): 670 mL/min Dialysate Flow Rate (mL/min): 600 ml/min Conductivity: Machine : 13.5 Conductivity: Machine : 13.5 Dialysis Fluid Bolus: Normal Saline Bolus Amount (mL): 250 mL Dialysate Change: 2K(k resulted 5.1 this am)  History taken with assistance of Administrator, sports. Complains of pain.   Objective:  Vital signs in last 24 hours:  Temp:  [98.1 F (36.7 C)-98.3 F (36.8 C)] 98.2 F (36.8 C) (11/10 1115) Pulse Rate:  [63-68] 63 (11/10 1200) Resp:  [12-21] 12 (11/10 1200) BP: (119-148)/(62-69) 132/67 (11/10 1200) SpO2:  [97 %-100 %] 98 % (11/10 1200) Weight:  [70.1 kg (154 lb 8.7 oz)] 70.1 kg (154 lb 8.7 oz) (11/10 1115)  Weight change:  Filed Weights   06/11/17 1253 06/11/17 1551 06/13/17 1115  Weight: 75.3 kg (166 lb) 74 kg (163 lb 2.3 oz) 70.1 kg (154 lb 8.7 oz)    Intake/Output: I/O last 3 completed shifts: In: 234 [P.O.:234] Out: 0    Intake/Output this shift:  Total I/O In: 120 [P.O.:120] Out: 0   Physical Exam: General: NAD, laying in bed  Head: Normocephalic, atraumatic. Moist oral mucosal membranes  Eyes: Anicteric, PERRL  Neck: Supple, trachea midline  Lungs:  Clear to auscultation  Heart: Regular rate and rhythm  Abdomen:  soft  Extremities: trace peripheral edema.  Neurologic: Nonfocal, moving all four extremities  Skin: No lesions  Access: Left arm AVF    Basic Metabolic Panel: Recent Labs  Lab 06/09/17 1548 06/10/17 0356 06/10/17 1153 06/11/17 0423 06/12/17 0351 06/13/17 1116  NA 136 135  --  134* 135 133*  K 4.0 4.9  --  5.1 4.4 4.6  CL 95* 96*  --  96* 95* 93*  CO2 26 27  --  25 27 27   GLUCOSE 83 82  --   87 109* 122*  BUN 25* 29*  --  35* 24* 35*  CREATININE 6.96* 7.99*  --  9.65* 7.32* 9.48*  CALCIUM 9.1 9.4  --  8.9 8.7* 9.1  MG  --   --  2.5*  --   --   --   PHOS  --   --  7.8*  --   --  5.9*    Liver Function Tests: Recent Labs  Lab 06/09/17 1548 06/11/17 0423 06/13/17 1116  AST 25 15  --   ALT 12* 9*  --   ALKPHOS 204* 179*  --   BILITOT 0.9 0.7  --   PROT 7.4 6.6  --   ALBUMIN 3.5 3.3* 3.1*   Recent Labs  Lab 06/09/17 1548  LIPASE 50   No results for input(s): AMMONIA in the last 168 hours.  CBC: Recent Labs  Lab 06/09/17 1548 06/10/17 0356 06/11/17 0423 06/12/17 0351 06/13/17 1116  WBC 4.5 3.4* 4.6 4.6 5.0  HGB 12.2* 12.0* 11.6* 11.3* 11.6*  HCT 37.1* 36.6* 35.4* 35.0* 35.7*  MCV 94.8 96.6 96.4 96.1 95.9  PLT 143* 116* 130* 130* 129*    Cardiac Enzymes: Recent Labs  Lab 06/09/17 1548 06/09/17 2221 06/10/17 0356 06/10/17 1002  TROPONINI 0.04* 0.04* 0.04* 0.03*    BNP: Invalid input(s): POCBNP  CBG: Recent Labs  Lab 06/13/17 0514  GLUCAP 119*    Microbiology: Results for  orders placed or performed during the hospital encounter of 06/09/17  Body fluid culture     Status: None   Collection Time: 06/09/17  3:48 PM  Result Value Ref Range Status   Specimen Description ASCITIC  Final   Special Requests NONE  Final   Gram Stain   Final    CYTOSPIN SMEAR WBC PRESENT, PREDOMINANTLY MONONUCLEAR NO ORGANISMS SEEN    Culture   Final    NO GROWTH 3 DAYS Performed at [facility], [place] [address] [place], [place] [address]    Report Status 06/13/2017 FINAL  Final  MRSA PCR Screening     Status: None   Collection Time: 06/10/17  8:55 AM  Result Value Ref Range Status   MRSA by PCR NEGATIVE NEGATIVE Final    Comment:        The GeneXpert MRSA Assay (FDA approved for NASAL specimens only), is one component of a comprehensive MRSA colonization surveillance program. It is not intended to diagnose MRSA infection nor to guide  or monitor treatment for MRSA infections.   Body fluid culture     Status: None (Preliminary result)   Collection Time: 06/11/17  4:32 PM  Result Value Ref Range Status   Specimen Description PERITONEAL  Final   Special Requests NONE  Final   Gram Stain   Final    RARE WBC PRESENT, PREDOMINANTLY PMN NO ORGANISMS SEEN    Culture   Final    NO GROWTH 2 DAYS Performed at [facility], [place] [address], [place], [place] [address]    Report Status PENDING  Incomplete    Coagulation Studies: No results for input(s): LABPROT, INR in the last 72 hours.  Urinalysis: No results for input(s): COLORURINE, LABSPEC, PHURINE, GLUCOSEU, HGBUR, BILIRUBINUR, KETONESUR, PROTEINUR, UROBILINOGEN, NITRITE, LEUKOCYTESUR in the last 72 hours.  Invalid input(s): APPERANCEUR    Imaging: US Paracentesis  Result Date: 06/12/2017 INDICATION: Recurrent symptomatic ascites. Please perform ultrasound-guided paracentesis for diagnostic and therapeutic purposes. EXAM: ULTRASOUND-GUIDED PARACENTESIS COMPARISON:  None. MEDICATIONS: None. COMPLICATIONS: None immediate. TECHNIQUE: Informed written consent was obtained from the patient after a discussion of the risks, benefits and alternatives to treatment. A timeout was performed prior to the initiation of the procedure. Initial ultrasound scanning demonstrates a moderate amount of ascites within the right lower abdominal quadrant. The right lower abdomen was prepped and draped in the usual sterile fashion. 1% lidocaine with epinephrine was used for local anesthesia. An ultrasound image was saved for documentation purposed. An 8 Fr Safe-T-Centesis catheter was introduced. The paracentesis was initiated however after the aspiration of approximately 500 cc of ascitic fluid, the Safe-T-Centesis catheter became apposed against a wall of bowel and could not be freed. As such, the Safe-T-Centesis catheter was removed and under direct ultrasound guidance, a 19 gauge, 7-cm,  Yueh catheter was introduced. The paracentesis was completed. The catheter was removed and a dressing was applied. The patient tolerated the procedure well without immediate post procedural complication. FINDINGS: A total of approximately 4.5 liters of serous fluid was removed. IMPRESSION: Successful ultrasound-guided paracentesis yielding 4.5 liters of peritoneal fluid. Electronically Signed   By: Sandi Mariscal M.D.   On: 06/12/2017 07:52     Medications:   . cefTAZidime (FORTAZ)  IV     . amLODipine  10 mg Oral Daily  . B-complex with vitamin C  1 tablet Oral Daily  . calcium acetate  2,001 mg Oral TID WC  . cholestyramine  4 g Oral TID  WC  . feeding supplement (NEPRO CARB STEADY)  237 mL Oral TID BM  . ferric citrate  420 mg Oral TID WC  . folic acid  1 mg Oral Daily  . heparin  5,000 Units Subcutaneous Q8H  . losartan  100 mg Oral Daily  . metoprolol tartrate  25 mg Oral BID  . multivitamin  15 mL Oral Once per day on Mon Thu  . pantoprazole  40 mg Oral Daily   acetaminophen **OR** acetaminophen, hydrALAZINE, HYDROcodone-acetaminophen, hydrOXYzine, LORazepam, ondansetron **OR** ondansetron (ZOFRAN) IV, promethazine  Assessment/ Plan:  Mario [name] is a 46 y.o. Hispanic (Spanish speaking only) male with end stage renal disease on hemodialysis, hypertension, hepatic alcoholic cirrhosis  [facility], [place]., Tuesday, Thursday, Saturday  1. End-stage renal disease: with hyperkalemia:  Seen and examined on hemodialysis. Toleratign treatment well. - Continue TTS schedule.   2. Spontaneous Bacterial Peritonitis: wbc 739 on 11/6 - status post large volume paracentesis 4.5 liters on 11/8 - Ceftazidime 2g with HD treatment until 11/15.   3. Hypertension: blood pressure at goal - amlodipine, losartan, metoprolol  4. Anemia of chronic kidney disease: Hemoglobin 11.6 - EPO as outpatient.   5. Secondary Hyperparathyroidism: with hyperphosphatemia: phos 5.9 -  calcium acetate and Auryxia with meals.    LOS: [place], [place] 11/10/201812:14 PM

## 2017-06-13 NOTE — Discharge Summary (Signed)
[facility] NAME: Mario [name]    MR#:  [id number]  DATE OF BIRTH:  1971/07/20  DATE OF ADMISSION:  06/09/2017 ADMITTING PHYSICIAN: Dustin Flock, MD  DATE OF DISCHARGE: 06/13/17  PRIMARY CARE PHYSICIAN: [name], MD    ADMISSION DIAGNOSIS:  SBP (spontaneous bacterial peritonitis) ([place]) [K65.2]  DISCHARGE DIAGNOSIS:  Spontaneous bacterial peritonitis--- she to get IV antibiotics at dialysis Tuesday Thursday Saturday.  Last dose of antibiotic on 06/18/2017 Recurrent ascites status post paracentesis 4 L removed on 06/11/2017 SECONDARY DIAGNOSIS:   Past Medical History:  Diagnosis Date  . Abdominal pain 08/28/2016  . Acute hyperkalemia 10/28/2016  . Chest pain 04/11/2016  . Chronic combined systolic and diastolic CHF (congestive heart failure) ([place])   . Cirrhosis ([place])   . Dialysis patient ([place])   . ESRD (end stage renal disease) on dialysis ([place]) 01/25/2015  . Fluid overload 04/12/2016  . HTN (hypertension) 10/13/2016  . Hyperkalemia 11/27/2016  . Hypertension   . Pulmonary edema 07/03/2016  . Renal disorder   . Renal insufficiency   . Scrotal infection 01/25/2015    HOSPITAL COURSE:   46 year old with liver cirrhosis and end-stage renal disease with abdominal pain  1.Spontaneous bacterial peritonitis: Based on clinical symptoms and body fluid analysis - Continue IV Rocephin ---changed to IV ceftazidime at dialysis Tuesday Thursday Saturday last dose 06/18/2017 - Appreciate Dr. Ola Spurr input -Body fluid cultures negative  *recurrent ascites - US guided paracentesis--- removed 4 L on 06/11/2017  2.End-stage renal disease Nephrology following for hemodialysis need  3.Essential hypertension Continue amlodipine, metoprolol -Patient not taking Cozaar at home causes lot of itching.  I have discontinued Cozaar for now  4.Chest pain atypical in nature.  Now resolved  5. Livercirrhosis  supportive care  6.Heparin for DVT prophylaxis  Overall hemodynamically stable.  Patient will discharge home after HD today. No more vomting Above all was discussed Via Spanish interpreter CONSULTS OBTAINED:  Treatment Team:  Lavonia Dana, MD Leonel Ramsay, MD  DRUG ALLERGIES:   Allergies  Allergen Reactions  . Betadine [Povidone Iodine] Rash    DISCHARGE MEDICATIONS:   Current Discharge Medication List    START taking these medications   Details  cefTAZidime 2 g in dextrose 5 % 50 mL Inject 2 g Every Tuesday,Thursday,and Saturday with dialysis into the vein. Qty: 4 application, Refills: 0    Multiple Vitamin (MULTIVITAMIN) LIQD Take 15 mLs 2 (two) times a week by mouth. Qty: 1 Bottle, Refills: 1    Nutritional Supplements (FEEDING SUPPLEMENT, NEPRO CARB STEADY,) LIQD Take 237 mLs 3 (three) times daily between meals by mouth. Qty: 30 Can, Refills: 0      CONTINUE these medications which have NOT CHANGED   Details  metoprolol tartrate (LOPRESSOR) 25 MG tablet Take 1 tablet (25 mg total) by mouth 2 (two) times daily. Qty: 60 tablet, Refills: 0    amLODipine (NORVASC) 10 MG tablet Take 1 tablet (10 mg total) by mouth daily. Qty: 30 tablet, Refills: 0    calcium acetate (PHOSLO) 667 MG tablet Take 3 tablets (2,001 mg total) by mouth 3 (three) times daily with meals.    cholestyramine (QUESTRAN) 4 g packet Take 1 packet (4 g total) by mouth 3 (three) times daily with meals. Qty: 60 each, Refills: 12    ciprofloxacin (CIPRO) 500 MG tablet Take 1 tablet (500 mg total) by mouth at bedtime. Qty: 10 tablet, Refills: 0    ferric citrate (AURYXIA)  1 GM 210 MG(Fe) tablet Take 420 mg by mouth 3 (three) times daily with meals.    hydrOXYzine (ATARAX/VISTARIL) 25 MG tablet Take 1 tablet (25 mg total) by mouth 3 (three) times daily as needed for itching. Qty: 60 tablet, Refills: 0    LORazepam (ATIVAN) 0.5 MG tablet Take 0.5mg  tablet prior to dialysis for anxiety if  needed    ondansetron (ZOFRAN ODT) 4 MG disintegrating tablet Take 1 tablet (4 mg total) by mouth every 8 (eight) hours as needed for nausea or vomiting. Qty: 15 tablet, Refills: 0    pantoprazole (PROTONIX) 40 MG tablet Take 1 tablet (40 mg total) by mouth daily. Qty: 30 tablet, Refills: 0      STOP taking these medications     losartan (COZAAR) 100 MG tablet      predniSONE (DELTASONE) 10 MG tablet         If you experience worsening of your admission symptoms, develop shortness of breath, life threatening emergency, suicidal or homicidal thoughts you must seek medical attention immediately by calling 911 or calling your MD immediately  if symptoms less severe.  You Must read complete instructions/literature along with all the possible adverse reactions/side effects for all the Medicines you take and that have been prescribed to you. Take any new Medicines after you have completely understood and accept all the possible adverse reactions/side effects.   Please note  You were cared for by a hospitalist during your hospital stay. If you have any questions about your discharge medications or the care you received while you were in the hospital after you are discharged, you can call the unit and asked to speak with the hospitalist on call if the hospitalist that took care of you is not available. Once you are discharged, your primary care physician will handle any further medical issues. Please note that NO REFILLS for any discharge medications will be authorized once you are discharged, as it is imperative that you return to your primary care physician (or establish a relationship with a primary care physician if you do not have one) for your aftercare needs so that they can reassess your need for medications and monitor your lab values. Today   SUBJECTIVE  Denies any complaint No vomiting C/o chronic pain VITAL SIGNS:  Blood pressure (!) 150/74, pulse 63, temperature 98.2 F (36.8  C), temperature source Oral, resp. rate 17, height 5\' 2"  (1.575 m), weight 70.1 kg (154 lb 8.7 oz), SpO2 98 %.  I/O:    Intake/Output Summary (Last 24 hours) at 06/13/2017 1233 Last data filed at 06/13/2017 1003 Gross per 24 hour  Intake 120 ml  Output 0 ml  Net 120 ml    PHYSICAL EXAMINATION:  GENERAL:  46 y.o.-year-old patient lying in the bed with no acute distress.  Chronically ill EYES: Pupils equal, round, reactive to light and accommodation. No scleral icterus. Extraocular muscles intact.  HEENT: Head atraumatic, normocephalic. Oropharynx and nasopharynx clear.  NECK:  Supple, no jugular venous distention. No thyroid enlargement, no tenderness.  LUNGS: Decreased breath sounds bilaterally, no wheezing, rales,rhonchi or crepitation. No use of accessory muscles of respiration.  CARDIOVASCULAR: S1, S2 normal. No murmurs, rubs, or gallops.  ABDOMEN: Soft, non-tender, mildly distended. Bowel sounds present. No organomegaly or mass.  EXTREMITIES: No pedal edema, cyanosis, or clubbing.  NEUROLOGIC: Cranial nerves II through XII are intact. Muscle strength 5/5 in all extremities. Sensation intact. Gait not checked.  PSYCHIATRIC:  patient is alert and oriented x 3.  SKIN: No obvious rash, lesion, or ulcer.   DATA REVIEW:   CBC  Recent Labs  Lab 06/13/17 1116  WBC 5.0  HGB 11.6*  HCT 35.7*  PLT 129*    Chemistries  Recent Labs  Lab 06/10/17 1153 06/11/17 0423  06/13/17 1116  NA  --  134*   < > 133*  K  --  5.1   < > 4.6  CL  --  96*   < > 93*  CO2  --  25   < > 27  GLUCOSE  --  87   < > 122*  BUN  --  35*   < > 35*  CREATININE  --  9.65*   < > 9.48*  CALCIUM  --  8.9   < > 9.1  MG 2.5*  --   --   --   AST  --  15  --   --   ALT  --  9*  --   --   ALKPHOS  --  179*  --   --   BILITOT  --  0.7  --   --    < > = values in this interval not displayed.    Microbiology Results   Recent Results (from the past 240 hour(s))  Body fluid culture     Status: None    Collection Time: 06/09/17  3:48 PM  Result Value Ref Range Status   Specimen Description ASCITIC  Final   Special Requests NONE  Final   Gram Stain   Final    CYTOSPIN SMEAR WBC PRESENT, PREDOMINANTLY MONONUCLEAR NO ORGANISMS SEEN    Culture   Final    NO GROWTH 3 DAYS Performed at [facility], [address], [place], [place] [address]    Report Status 06/13/2017 FINAL  Final  MRSA PCR Screening     Status: None   Collection Time: 06/10/17  8:55 AM  Result Value Ref Range Status   MRSA by PCR NEGATIVE NEGATIVE Final    Comment:        The GeneXpert MRSA Assay (FDA approved for NASAL specimens only), is one component of a comprehensive MRSA colonization surveillance program. It is not intended to diagnose MRSA infection nor to guide or monitor treatment for MRSA infections.   Body fluid culture     Status: None (Preliminary result)   Collection Time: 06/11/17  4:32 PM  Result Value Ref Range Status   Specimen Description PERITONEAL  Final   Special Requests NONE  Final   Gram Stain   Final    RARE WBC PRESENT, PREDOMINANTLY PMN NO ORGANISMS SEEN    Culture   Final    NO GROWTH 2 DAYS Performed at [facility], [place] [address] [place], [place] [address]    Report Status PENDING  Incomplete    RADIOLOGY:  US Paracentesis  Result Date: 06/12/2017 INDICATION: Recurrent symptomatic ascites. Please perform ultrasound-guided paracentesis for diagnostic and therapeutic purposes. EXAM: ULTRASOUND-GUIDED PARACENTESIS COMPARISON:  None. MEDICATIONS: None. COMPLICATIONS: None immediate. TECHNIQUE: Informed written consent was obtained from the patient after a discussion of the risks, benefits and alternatives to treatment. A timeout was performed prior to the initiation of the procedure. Initial ultrasound scanning demonstrates a moderate amount of ascites within the right lower abdominal quadrant. The right lower abdomen was prepped and draped in the usual sterile  fashion. 1% lidocaine with epinephrine was used for local anesthesia. An ultrasound image was saved for  documentation purposed. An 8 Fr Safe-T-Centesis catheter was introduced. The paracentesis was initiated however after the aspiration of approximately 500 cc of ascitic fluid, the Safe-T-Centesis catheter became apposed against a wall of bowel and could not be freed. As such, the Safe-T-Centesis catheter was removed and under direct ultrasound guidance, a 19 gauge, 7-cm, Yueh catheter was introduced. The paracentesis was completed. The catheter was removed and a dressing was applied. The patient tolerated the procedure well without immediate post procedural complication. FINDINGS: A total of approximately 4.5 liters of serous fluid was removed. IMPRESSION: Successful ultrasound-guided paracentesis yielding 4.5 liters of peritoneal fluid. Electronically Signed   By: Sandi Mariscal M.D.   On: 06/12/2017 07:52     Management plans discussed with the patient, family and they are in agreement.  CODE STATUS:     Code Status Orders  (From admission, onward)        Start     Ordered   06/09/17 2048  Full code  Continuous     06/09/17 2047    Code Status History    Date Active Date Inactive Code Status Order ID Comments User Context   01/06/2017 14:37 01/07/2017 17:44 Full Code [id number]  Epifanio Lesches, MD ED   11/27/2016 14:31 11/30/2016 14:23 Full Code [id number]  Henreitta Leber, MD Inpatient   10/28/2016 16:11 10/31/2016 16:38 Full Code [id number]  Epifanio Lesches, MD ED   10/13/2016 21:36 10/14/2016 21:23 Full Code [id number]  Lance Coon, MD Inpatient   08/28/2016 16:58 08/30/2016 17:19 Full Code [id number]  Fritzi Mandes, MD ED   07/03/2016 20:06 07/06/2016 16:22 Full Code [id number]  Fritzi Mandes, MD ED   04/11/2016 18:57 04/12/2016 07:16 Full Code [id number]  Vaughan Basta, MD Inpatient   01/25/2015 12:29 01/28/2015 16:26 Full Code [id number]  Dustin Flock, MD Inpatient      TOTAL TIME  TAKING CARE OF THIS PATIENT: *40* minutes.    Lamiyah Schlotter M.D on 06/13/2017 at 12:33 PM  Between 7am to 6pm - Pager - [phone] After 6pm go to [email or web address] - Proofreader  [facility]  Office  [phone]  CC: Primary care physician; [name], MD

## 2017-06-13 NOTE — Progress Notes (Signed)
This note also relates to the following rows which could not be included: Pulse Rate - Cannot attach notes to unvalidated device data Resp - Cannot attach notes to unvalidated device data BP - Cannot attach notes to unvalidated device data SpO2 - Cannot attach notes to unvalidated device data  HD STARTED  

## 2017-06-13 NOTE — Progress Notes (Signed)
POST DIALYSIS ASSESSMENT 

## 2017-06-13 NOTE — Progress Notes (Signed)
Per Dr. Posey Pronto okay to place order for PT to come see patient.

## 2017-06-13 NOTE — Progress Notes (Signed)
Mario [name]  A and O x 4. VSS. Pt tolerating diet well. Minimal complaints of pain and nausea. IV removed intact, prescriptions given. Per MD dialysis to have prescription for antibiotic. Pt voiced understanding of discharge instructions with no further questions. Interpreter used for discharge instructions. Patient agreed to antibiotic and then was yelling at RN that his ride was down stairs and to take IV out. Patient did not finish antibiotic. Pt discharged via wheelchair with RN. Upon getting downstairs patient jumped out of wheelchair and took off walking. RN unsure if patients ride was downstairs or not. Security officer sent by RN to make sure that patient is okay.   Lynann Bologna MSN, RN-BC  Allergies as of 06/13/2017      Reactions   Betadine [povidone Iodine] Rash      Medication List    STOP taking these medications   losartan 100 MG tablet Commonly known as:  COZAAR   predniSONE 10 MG tablet Commonly known as:  DELTASONE     TAKE these medications   amLODipine 10 MG tablet Commonly known as:  NORVASC Take 1 tablet (10 mg total) by mouth daily.   AURYXIA 1 GM 210 MG(Fe) tablet Generic drug:  ferric citrate Take 420 mg by mouth 3 (three) times daily with meals.   calcium acetate 667 MG tablet Commonly known as:  PHOSLO Take 3 tablets (2,001 mg total) by mouth 3 (three) times daily with meals.   cefTAZidime 2 g in dextrose 5 % 50 mL Inject 2 g Every Tuesday,Thursday,and Saturday with dialysis into the vein.   cholestyramine 4 g packet Commonly known as:  QUESTRAN Take 1 packet (4 g total) by mouth 3 (three) times daily with meals.   ciprofloxacin 500 MG tablet Commonly known as:  CIPRO Take 1 tablet (500 mg total) by mouth at bedtime.   feeding supplement (NEPRO CARB STEADY) Liqd Take 237 mLs 3 (three) times daily between meals by mouth.   hydrOXYzine 25 MG tablet Commonly known as:  ATARAX/VISTARIL Take 1 tablet (25 mg total) by mouth 3 (three) times  daily as needed for itching.   LORazepam 0.5 MG tablet Commonly known as:  ATIVAN Take 0.5mg  tablet prior to dialysis for anxiety if needed   metoprolol tartrate 25 MG tablet Commonly known as:  LOPRESSOR Take 1 tablet (25 mg total) by mouth 2 (two) times daily.   multivitamin Liqd Take 15 mLs 2 (two) times a week by mouth. Start taking on:  06/15/2017   ondansetron 4 MG disintegrating tablet Commonly known as:  ZOFRAN ODT Take 1 tablet (4 mg total) by mouth every 8 (eight) hours as needed for nausea or vomiting.   pantoprazole 40 MG tablet Commonly known as:  PROTONIX Take 1 tablet (40 mg total) by mouth daily.            Home Infusion Instuctions  (From admission, onward)        Start     Ordered   06/12/17 0000  Home infusion instructions [facility] May follow [place] Dosing Protocol; May administer Cathflo as needed to maintain patency of vascular access device.; Flushing of vascular access device: per [facility] Protocol: 0.9% NaCl pre/post medica...    Question Answer Comment  Instructions May follow [place] Dosing Protocol   Instructions May administer Cathflo as needed to maintain patency of vascular access device.   Instructions Flushing of vascular access device: per [facility] Protocol: 0.9% NaCl pre/post medication administration and prn patency; Heparin  100 u/ml, 71ml for implanted ports and Heparin 10u/ml, 23ml for all other central venous catheters.   Instructions May follow [facility] Anaphylaxis Protocol for First Dose Administration in the home: 0.9% NaCl at 25-50 ml/hr to maintain IV access for protocol meds. Epinephrine 0.3 ml IV/IM PRN and Benadryl 25-50 IV/IM PRN s/s of anaphylaxis.   Instructions [facility] Infusion Coordinator (RN) to assist per patient IV care needs in the home PRN.      06/12/17 0956      Vitals:   06/13/17 1330 06/13/17 1421  BP: (!) 157/70 (!) 161/75  Pulse: 64 69  Resp: 10 20  Temp: 97.8 F (36.6 C) 97.8 F (36.6 C)   SpO2:  100%

## 2017-06-13 NOTE — Progress Notes (Signed)
[facility] [place] NAME: Mario [name]    MR#:  [id number]  DATE OF BIRTH:  Aug 15, 1970  SUBJECTIVE:  Patient's discharge yesterday canceled secondary to vomiting.  He has not vomited.  He ate some bagel according to the nurse. Appears somewhat irritable Clearly indicate what is going on with him. Via interpreter REVIEW OF SYSTEMS:   Review of Systems  Unable to perform ROS: Language   Tolerating Diet:yes Tolerating PT: pending  DRUG ALLERGIES:   Allergies  Allergen Reactions  . Betadine [Povidone Iodine] Rash    VITALS:  Blood pressure 138/62, pulse 63, temperature 98.3 F (36.8 C), temperature source Oral, resp. rate 18, height 5\' 2"  (1.575 m), weight 74 kg (163 lb 2.3 oz), SpO2 99 %.  PHYSICAL EXAMINATION:   Physical Exam  GENERAL:  46 y.o.-year-old patient lying in the bed with no acute distress. chronically ill EYES: Pupils equal, round, reactive to light and accommodation. No scleral icterus. Extraocular muscles intact.  HEENT: Head atraumatic, normocephalic. Oropharynx and nasopharynx clear.  NECK:  Supple, no jugular venous distention. No thyroid enlargement, no tenderness.  LUNGS: Normal breath sounds bilaterally, no wheezing, rales, rhonchi. No use of accessory muscles of respiration.  CARDIOVASCULAR: S1, S2 normal. No murmurs, rubs, or gallops.  ABDOMEN: Soft, nontender, distended +. Bowel sounds present. No organomegaly or mass.  EXTREMITIES: No cyanosis, clubbing or edema b/l.    NEUROLOGIC: Cranial nerves II through XII are intact. No focal Motor or sensory deficits b/l.   PSYCHIATRIC:  patient is alert .  SKIN: No obvious rash, lesion, or ulcer.   LABORATORY PANEL:  CBC Recent Labs  Lab 06/12/17 0351  WBC 4.6  HGB 11.3*  HCT 35.0*  PLT 130*    Chemistries  Recent Labs  Lab 06/10/17 1153 06/11/17 0423 06/12/17 0351  NA  --  134* 135  K  --  5.1 4.4  CL  --  96* 95*  CO2  --  25 27   GLUCOSE  --  87 109*  BUN  --  35* 24*  CREATININE  --  9.65* 7.32*  CALCIUM  --  8.9 8.7*  MG 2.5*  --   --   AST  --  15  --   ALT  --  9*  --   ALKPHOS  --  179*  --   BILITOT  --  0.7  --    Cardiac Enzymes Recent Labs  Lab 06/10/17 1002  TROPONINI 0.03*   RADIOLOGY:  US Paracentesis  Result Date: 06/12/2017 INDICATION: Recurrent symptomatic ascites. Please perform ultrasound-guided paracentesis for diagnostic and therapeutic purposes. EXAM: ULTRASOUND-GUIDED PARACENTESIS COMPARISON:  None. MEDICATIONS: None. COMPLICATIONS: None immediate. TECHNIQUE: Informed written consent was obtained from the patient after a discussion of the risks, benefits and alternatives to treatment. A timeout was performed prior to the initiation of the procedure. Initial ultrasound scanning demonstrates a moderate amount of ascites within the right lower abdominal quadrant. The right lower abdomen was prepped and draped in the usual sterile fashion. 1% lidocaine with epinephrine was used for local anesthesia. An ultrasound image was saved for documentation purposed. An 8 Fr Safe-T-Centesis catheter was introduced. The paracentesis was initiated however after the aspiration of approximately 500 cc of ascitic fluid, the Safe-T-Centesis catheter became apposed against a wall of bowel and could not be freed. As such, the Safe-T-Centesis catheter was removed and under direct ultrasound guidance, a 19 gauge, 7-cm, Yueh catheter was introduced.  The paracentesis was completed. The catheter was removed and a dressing was applied. The patient tolerated the procedure well without immediate post procedural complication. FINDINGS: A total of approximately 4.5 liters of serous fluid was removed. IMPRESSION: Successful ultrasound-guided paracentesis yielding 4.5 liters of peritoneal fluid. Electronically Signed   By: Sandi Mariscal M.D.   On: 06/12/2017 07:52   ASSESSMENT AND PLAN:  46 year old with liver cirrhosis and end-stage  renal disease with abdominal pain  1.Spontaneous bacterial peritonitis: Based on clinical symptoms and body fluid analysis - Continue IV Rocephin ---changed to IV ceftazidime at dialysis Tuesday Thursday Saturday last dose 06/18/2017 -AppreciateDr. Ola Spurr input -Body fluid cultures negative  *recurrent ascites - US guided paracentesis--- removed 4 L on 06/11/2017  2.End-stage renal disease Nephrology following for hemodialysis need  3.Essential hypertension Continue amlodipine, metoprolol -Patient not taking Cozaar at home causes lot of itching.  I have discontinued Cozaar for now  4.Chest pain atypical in nature. Now resolved  5. Livercirrhosis supportive care  6.Heparin for DVT prophylaxis  7. vomiting subsided  PT to see today  Overall hemodynamically stable. Above all was discussed Via Spanish interpreter  Case discussed with Care Management/Social Worker. Management plans discussed with the patient, family and they are in agreement.  CODE STATUS: full  DVT Prophylaxis: heparin  TOTAL TIME TAKING CARE OF THIS PATIENT: *25* minutes.  >50% time spent on counselling and coordination of care  POSSIBLE D/C IN *1* DAYS, DEPENDING ON CLINICAL CONDITION.  Note: This dictation was prepared with Dragon dictation along with smaller phrase technology. Any transcriptional errors that result from this process are unintentional.  Airyanna Dipalma M.D on 06/13/2017 at 10:54 AM  Between 7am to 6pm - Pager - [phone]  After 6pm go to [email or web address] - Proofreader  [facility]  Office  [phone]  CC: Primary care physician; [name], MD

## 2017-06-13 NOTE — Progress Notes (Signed)
PRE DIALYSIS ASSESSMENT 

## 2017-06-13 NOTE — Progress Notes (Signed)
Primary nurse rounded on pt. Pt complained of feeling week and dizzy. Upon assessment of pt; pt was noted to have swelling in the AV fistula. Celin the NA interpreted for the primary nurse and pt. Primary nurse paged and spoke to Dr. Estanislado Pandy. Dr. Estanislado Pandy gave orders to primary nurse to have nephrology to see pt in AM. Primary nurse to continue to monitor

## 2017-06-14 LAB — TOTAL BILIRUBIN, BODY FLUID: TOTBILIFLUID: 0.3 mg/dL

## 2017-06-15 LAB — BODY FLUID CULTURE: Culture: NO GROWTH

## 2017-06-15 LAB — LIPASE, FLUID: Lipase-Fluid: 23 U/L

## 2017-06-15 LAB — CYTOLOGY - NON PAP

## 2017-06-15 LAB — PH, BODY FLUID: PH, BODY FLUID: 7.7

## 2017-08-20 ENCOUNTER — Encounter: Payer: Self-pay | Admitting: Emergency Medicine

## 2017-08-20 ENCOUNTER — Inpatient Hospital Stay
Admission: EM | Admit: 2017-08-20 | Discharge: 2017-08-23 | DRG: 371 | Disposition: A | Discharge status: Home / Self Care | Payer: Medicaid Other | Attending: Internal Medicine | Admitting: Internal Medicine

## 2017-08-20 ENCOUNTER — Emergency Department: Payer: Medicaid Other

## 2017-08-20 DIAGNOSIS — N2581 Secondary hyperparathyroidism of renal origin: Secondary | ICD-10-CM | POA: Diagnosis present

## 2017-08-20 DIAGNOSIS — K7210 Chronic hepatic failure without coma: Secondary | ICD-10-CM | POA: Diagnosis present

## 2017-08-20 DIAGNOSIS — I5042 Chronic combined systolic (congestive) and diastolic (congestive) heart failure: Secondary | ICD-10-CM | POA: Diagnosis present

## 2017-08-20 DIAGNOSIS — Z6822 Body mass index (BMI) 22.0-22.9, adult: Secondary | ICD-10-CM

## 2017-08-20 DIAGNOSIS — K7031 Alcoholic cirrhosis of liver with ascites: Secondary | ICD-10-CM | POA: Diagnosis present

## 2017-08-20 DIAGNOSIS — Z79899 Other long term (current) drug therapy: Secondary | ICD-10-CM

## 2017-08-20 DIAGNOSIS — Z992 Dependence on renal dialysis: Secondary | ICD-10-CM

## 2017-08-20 DIAGNOSIS — R109 Unspecified abdominal pain: Secondary | ICD-10-CM | POA: Diagnosis present

## 2017-08-20 DIAGNOSIS — Z888 Allergy status to other drugs, medicaments and biological substances status: Secondary | ICD-10-CM

## 2017-08-20 DIAGNOSIS — F17210 Nicotine dependence, cigarettes, uncomplicated: Secondary | ICD-10-CM | POA: Diagnosis present

## 2017-08-20 DIAGNOSIS — F1010 Alcohol abuse, uncomplicated: Secondary | ICD-10-CM | POA: Diagnosis present

## 2017-08-20 DIAGNOSIS — E440 Moderate protein-calorie malnutrition: Secondary | ICD-10-CM

## 2017-08-20 DIAGNOSIS — D631 Anemia in chronic kidney disease: Secondary | ICD-10-CM | POA: Diagnosis present

## 2017-08-20 DIAGNOSIS — N186 End stage renal disease: Secondary | ICD-10-CM | POA: Diagnosis present

## 2017-08-20 DIAGNOSIS — E869 Volume depletion, unspecified: Secondary | ICD-10-CM | POA: Diagnosis present

## 2017-08-20 DIAGNOSIS — K652 Spontaneous bacterial peritonitis: Principal | ICD-10-CM | POA: Diagnosis present

## 2017-08-20 DIAGNOSIS — R188 Other ascites: Secondary | ICD-10-CM

## 2017-08-20 DIAGNOSIS — Z841 Family history of disorders of kidney and ureter: Secondary | ICD-10-CM

## 2017-08-20 DIAGNOSIS — I132 Hypertensive heart and chronic kidney disease with heart failure and with stage 5 chronic kidney disease, or end stage renal disease: Secondary | ICD-10-CM | POA: Diagnosis present

## 2017-08-20 DIAGNOSIS — R748 Abnormal levels of other serum enzymes: Secondary | ICD-10-CM

## 2017-08-20 LAB — CBC
HEMATOCRIT: 38 % — AB (ref 40.0–52.0)
Hemoglobin: 12.8 g/dL — ABNORMAL LOW (ref 13.0–18.0)
MCH: 33 pg (ref 26.0–34.0)
MCHC: 33.7 g/dL (ref 32.0–36.0)
MCV: 97.9 fL (ref 80.0–100.0)
Platelets: 122 10*3/uL — ABNORMAL LOW (ref 150–440)
RBC: 3.88 MIL/uL — ABNORMAL LOW (ref 4.40–5.90)
RDW: 15 % — AB (ref 11.5–14.5)
WBC: 5.1 10*3/uL (ref 3.8–10.6)

## 2017-08-20 LAB — COMPREHENSIVE METABOLIC PANEL
ALBUMIN: 3.8 g/dL (ref 3.5–5.0)
ALT: 15 U/L — ABNORMAL LOW (ref 17–63)
AST: 27 U/L (ref 15–41)
Alkaline Phosphatase: 226 U/L — ABNORMAL HIGH (ref 38–126)
Anion gap: 12 (ref 5–15)
BILIRUBIN TOTAL: 1.4 mg/dL — AB (ref 0.3–1.2)
BUN: 12 mg/dL (ref 6–20)
CO2: 26 mmol/L (ref 22–32)
Calcium: 8.9 mg/dL (ref 8.9–10.3)
Chloride: 99 mmol/L — ABNORMAL LOW (ref 101–111)
Creatinine, Ser: 4.79 mg/dL — ABNORMAL HIGH (ref 0.61–1.24)
GFR calc Af Amer: 15 mL/min — ABNORMAL LOW (ref >60)
GFR calc non Af Amer: 13 mL/min — ABNORMAL LOW (ref >60)
GLUCOSE: 83 mg/dL (ref 65–99)
POTASSIUM: 3.9 mmol/L (ref 3.5–5.1)
SODIUM: 137 mmol/L (ref 135–145)
TOTAL PROTEIN: 7.4 g/dL (ref 6.5–8.1)

## 2017-08-20 LAB — BODY FLUID CELL COUNT WITH DIFFERENTIAL
EOS FL: 0 %
LYMPHS FL: 22 %
MONOCYTE-MACROPHAGE-SEROUS FLUID: 70 %
Neutrophil Count, Fluid: 8 %
OTHER CELLS FL: 0 %
WBC FLUID: 598 uL

## 2017-08-20 LAB — LIPASE, BLOOD: Lipase: 85 U/L — ABNORMAL HIGH (ref 11–51)

## 2017-08-20 LAB — PROTEIN, PLEURAL OR PERITONEAL FLUID: Total protein, fluid: 3.9 g/dL

## 2017-08-20 LAB — LACTATE DEHYDROGENASE, PLEURAL OR PERITONEAL FLUID: LD, Fluid: 108 U/L — ABNORMAL HIGH (ref 3–23)

## 2017-08-20 LAB — GLUCOSE, PLEURAL OR PERITONEAL FLUID: Glucose, Fluid: 99 mg/dL

## 2017-08-20 LAB — TROPONIN I: Troponin I: <0.03 ng/mL (ref <0.03)

## 2017-08-20 MED ORDER — LIDOCAINE-EPINEPHRINE 2 %-1:100000 IJ SOLN
30.0000 mL | Freq: Once | INTRAMUSCULAR | Status: AC
  Administered 2017-08-20: 30 mL via INTRADERMAL
  Filled 2017-08-20: qty 30

## 2017-08-20 MED ORDER — IOPAMIDOL (ISOVUE-370) INJECTION 76%
75.0000 mL | Freq: Once | INTRAVENOUS | Status: AC | PRN
  Administered 2017-08-20: 75 mL via INTRAVENOUS

## 2017-08-20 MED ORDER — HYDROMORPHONE HCL 1 MG/ML IJ SOLN
1.0000 mg | Freq: Once | INTRAMUSCULAR | Status: AC
  Administered 2017-08-20: 1 mg via INTRAVENOUS

## 2017-08-20 MED ORDER — ONDANSETRON HCL 4 MG/2ML IJ SOLN
4.0000 mg | Freq: Once | INTRAMUSCULAR | Status: AC
  Administered 2017-08-20: 4 mg via INTRAVENOUS

## 2017-08-20 MED ORDER — ONDANSETRON HCL 4 MG/2ML IJ SOLN
INTRAMUSCULAR | Status: AC
  Administered 2017-08-20: 4 mg via INTRAVENOUS
  Filled 2017-08-20: qty 2

## 2017-08-20 MED ORDER — HYDROMORPHONE HCL 1 MG/ML IJ SOLN
1.0000 mg | Freq: Once | INTRAMUSCULAR | Status: AC
  Administered 2017-08-20: 1 mg via INTRAVENOUS
  Filled 2017-08-20: qty 1

## 2017-08-20 MED ORDER — HYDROMORPHONE HCL 1 MG/ML IJ SOLN
INTRAMUSCULAR | Status: AC
  Administered 2017-08-20: 1 mg via INTRAVENOUS
  Filled 2017-08-20: qty 1

## 2017-08-20 MED ORDER — ONDANSETRON HCL 4 MG/2ML IJ SOLN
4.0000 mg | Freq: Once | INTRAMUSCULAR | Status: AC
  Administered 2017-08-20: 4 mg via INTRAVENOUS
  Filled 2017-08-20: qty 2

## 2017-08-20 MED ORDER — HYDROMORPHONE HCL 1 MG/ML IJ SOLN
1.0000 mg | Freq: Once | INTRAMUSCULAR | Status: AC
Start: 2017-08-20 — End: 2017-08-20
  Administered 2017-08-20: 1 mg via INTRAVENOUS
  Filled 2017-08-20: qty 1

## 2017-08-20 MED ORDER — PIPERACILLIN-TAZOBACTAM 3.375 G IVPB 30 MIN
3.3750 g | Freq: Once | INTRAVENOUS | Status: AC
  Administered 2017-08-20: 3.375 g via INTRAVENOUS
  Filled 2017-08-20: qty 50

## 2017-08-20 NOTE — ED Provider Notes (Signed)
[facility] Emergency Department Provider Note  ____________________________________________  Time seen: Approximately 8:18 PM  I have reviewed the triage vital signs and the nursing notes.   HISTORY  Chief Complaint Abdominal Pain and Hernia    HPI Mario [name] is a 47 y.o. male with a history of ESRD on HD, cirrhosis, multiple recent admissions for SBP, presenting with several days of severe diffuse abdominal pain, nausea and vomiting.  The patient states is not been able to tolerate anything for 3 days.  He did undergo complete dialysis today.  He denies any fevers or chills.  No constipation or diarrhea.  The patient does not produce urine.  Past Medical History:  Diagnosis Date  . Abdominal pain 08/28/2016  . Acute hyperkalemia 10/28/2016  . Chest pain 04/11/2016  . Chronic combined systolic and diastolic CHF (congestive heart failure) ([place])   . Cirrhosis ([place])   . Dialysis patient ([place])   . ESRD (end stage renal disease) on dialysis ([place]) 01/25/2015  . Fluid overload 04/12/2016  . HTN (hypertension) 10/13/2016  . Hyperkalemia 11/27/2016  . Hypertension   . Pulmonary edema 07/03/2016  . Renal disorder   . Renal insufficiency   . Scrotal infection 01/25/2015    Patient Active Problem List   Diagnosis Date Noted  . SBP (spontaneous bacterial peritonitis) ([place]) 06/09/2017  . Erroneous encounter - disregard 03/04/2017  . Hyperkalemia 11/27/2016  . Acute hyperkalemia 10/28/2016  . Chronic combined systolic and diastolic CHF (congestive heart failure) ([place]) 10/13/2016  . Cirrhosis ([place]) 10/13/2016  . HTN (hypertension) 10/13/2016  . Abdominal pain 08/28/2016  . Pulmonary edema 07/03/2016  . Fluid overload 04/12/2016  . Chest pain 04/11/2016  . Scrotal infection 01/25/2015  . ESRD (end stage renal disease) on dialysis ([place]) 01/25/2015    Past Surgical History:  Procedure Laterality Date  . AV FISTULA PLACEMENT      Current Outpatient Rx  .  Order #: [id number] Class: Normal  . Order #: [id number] Class: Historical Med  . Order #: [id number] Class: Normal  . Order #: [id number] Class: No Print  . Order #: [id number] Class: Print  . Order #: [id number] Class: Print  . Order #: [id number] Class: Print  . Order #: [id number] Class: Print  . Order #: [id number] Class: Normal  . Order #: [id number] Class: Normal  . Order #: [id number] Class: Print  . Order #: [id number] Class: Normal    Allergies Betadine [povidone iodine]  Family History  Problem Relation Age of Onset  . Kidney failure Father   . Diabetes Mother     Social History Social History   Tobacco Use  . Smoking status: Current Every Day Smoker    Packs/day: 0.25    Years: 25.00    Pack years: 6.25    Types: Cigarettes  . Smokeless tobacco: Never Used  Substance Use Topics  . Alcohol use: No    Comment: 1/2 bottle tequilla per day  . Drug use: No    Review of Systems Constitutional: No fever/chills.   Eyes: No visual changes. ENT: No sore throat. No congestion or rhinorrhea. Cardiovascular: Denies chest pain. Denies palpitations. Respiratory: Denies shortness of breath.  No cough. Gastrointestinal: Positive diffuse nonfocal abdominal distention and abdominal pain.  Positive nausea, positive vomiting.  No diarrhea.  No constipation. Genitourinary: No longer makes urine. Musculoskeletal: Negative for back pain. Skin: Negative for rash. Neurological: Negative for headaches. No focal numbness, tingling or weakness.     ____________________________________________   PHYSICAL EXAM:  VITAL SIGNS: ED Triage Vitals  Enc Vitals Group  BP 08/20/17 1925 (!) 162/84     Pulse Rate 08/20/17 1925 77     Resp 08/20/17 1925 20     Temp 08/20/17 1925 98 F (36.7 C)     Temp Source 08/20/17 1925 Oral     SpO2 08/20/17 1925 100 %     Weight 08/20/17 1926 154 lb (69.9 kg)     Height 08/20/17 1926 5' 6.93" (1.7 m)     Head Circumference --      Peak Flow --      Pain  Score 08/20/17 1925 10     Pain Loc --      Pain Edu? --      Excl. in [place]? --     Constitutional: The patient is alert and oriented.  He is uncomfortable appearing but nontoxic  eyes: Conjunctivae are normal.  EOMI. No scleral icterus. Head: Atraumatic. Nose: No congestion/rhinnorhea. Mouth/Throat: Mucous membranes are moist.  Neck: No stridor.  Supple.  Positive JVD.  No meningismus. Cardiovascular: Normal rate, regular rhythm. No murmurs, rubs or gallops.  Respiratory: Normal respiratory effort.  No accessory muscle use or retractions. Lungs CTAB.  No wheezes, rales or ronchi. Gastrointestinal: Soft, and moderately distended with fluid wave.  The patient has diffuse tenderness to palpation without focality.  Negative Murphy sign.  No guarding or rebound.  No peritoneal signs. GU: Normal-appearing penis.  No evidence of inguinal hernia.  The patient reports tenderness to palpation at the left inguinal crease.  There is no associated crepitus or skin change. Musculoskeletal: No LE edema.  Neurologic:  A&Ox3.  Speech is clear.  Face and smile are symmetric.  EOMI.  Moves all extremities well. Skin:  Skin is warm, dry and intact. No rash noted. Psychiatric: Mood and affect are normal.  ____________________________________________   LABS (all labs ordered are listed, but only abnormal results are displayed)  Labs Reviewed  LIPASE, BLOOD - Abnormal; Notable for the following components:      Result Value   Lipase 85 (*)    All other components within normal limits  COMPREHENSIVE METABOLIC PANEL - Abnormal; Notable for the following components:   Chloride 99 (*)    Creatinine, Ser 4.79 (*)    ALT 15 (*)    Alkaline Phosphatase 226 (*)    Total Bilirubin 1.4 (*)    GFR calc non Af Amer 13 (*)    GFR calc Af Amer 15 (*)    All other components within normal limits  CBC - Abnormal; Notable for the following components:   RBC 3.88 (*)    Hemoglobin 12.8 (*)    HCT 38.0 (*)    RDW  15.0 (*)    Platelets 122 (*)    All other components within normal limits  BODY FLUID CULTURE  CULTURE, BLOOD (ROUTINE X 2)  CULTURE, BLOOD (ROUTINE X 2)  TROPONIN I  BODY FLUID CELL COUNT WITH DIFFERENTIAL  GLUCOSE, PLEURAL OR PERITONEAL FLUID  PROTEIN, PLEURAL OR PERITONEAL FLUID  LACTATE DEHYDROGENASE, PLEURAL OR PERITONEAL FLUID   ____________________________________________  EKG  ED ECG REPORT I, Eula Listen, the attending physician, personally viewed and interpreted this ECG.   Date: 08/20/2017  EKG Time: 1932  Rate: 70  Rhythm: normal sinus rhythm  Axis: leftward  Intervals:none  ST&T Change: No STEMI  ____________________________________________  RADIOLOGY  Ct Abdomen Pelvis W Contrast  Result Date: 08/20/2017 CLINICAL DATA:  Abdominal pain and left inguinal hernia, fever EXAM: CT ABDOMEN AND PELVIS WITH CONTRAST TECHNIQUE: Multidetector  CT imaging of the abdomen and pelvis was performed using the standard protocol following bolus administration of intravenous contrast. CONTRAST:  48mL ISOVUE-370 IOPAMIDOL (ISOVUE-370) INJECTION 76% COMPARISON:  06/04/2017, 02/25/2017, 10/13/2016, 01/04/2016 CT FINDINGS: Lower chest: No acute consolidation or pleural effusion. Mild cardiomegaly. Hepatobiliary: Cirrhotic liver, very coarse nodular appearance. No focal hepatic abnormality. No calcified gallstone or biliary dilatation Pancreas: Unremarkable. No pancreatic ductal dilatation or surrounding inflammatory changes. Spleen: Enlarged, measuring 16 cm on coronal views. Adrenals/Urinary Tract: Stable 13 mm right adrenal gland nodule. Left adrenal gland is normal. Atrophic kidneys. No hydronephrosis. Multiple cysts. 17 mm mildly dense lesion mid right kidney, increased density but decreased size. Bladder nearly empty Stomach/Bowel: Stomach nonenlarged. Prominent loop of small bowel in the left abdomen measuring up to 3.1 cm but no obstruction is seen. There is no colon wall  thickening. Negative appendix. Vascular/Lymphatic: Non aneurysmal aorta. Calcifications in the wall of the vena cava and iliac veins. Portal and splenic veins are patent. Dilated tortuous azygos system. Large distal esophageal varices. Varices in the upper abdomen. Prominent right perirectal vessels read demonstrated. Recanalized umbilical vein. No significantly enlarged lymph nodes. Reproductive: Prostate is unremarkable. Other: Negative for free air. Moderate to large volume of ascites in the abdomen and pelvis. Musculoskeletal: No acute or significant osseous findings. IMPRESSION: 1. Cirrhosis of the liver with portal hypertension as evidenced by splenomegaly and multiple upper abdominal varices and portosystemic shunts. 2. Moderate to large volume of ascites in the abdomen and pelvis. 3. Mildly enlarged loop of small bowel in the left upper quadrant but no definitive bowel obstruction. 4. Atrophic kidneys. No hydronephrosis. Multiple cysts in the kidneys. 5. Stable right adrenal gland nodule Electronically Signed   By: Donavan Foil M.D.   On: 08/20/2017 21:31    ____________________________________________   PROCEDURES  Procedure(s) performed: None  .Paracentesis Date/Time: 08/20/2017 9:51 PM Performed by: Eula Listen, MD Authorized by: Eula Listen, MD   Consent:    Consent obtained:  Verbal and written   Consent given by:  Patient   Risks discussed:  Bleeding, bowel perforation, infection and pain   Alternatives discussed:  No treatment and delayed treatment Pre-procedure details:    Procedure purpose:  Diagnostic Anesthesia (see MAR for exact dosages):    Anesthesia method:  Local infiltration   Local anesthetic:  Lidocaine 2% WITH epi Procedure details:    Needle gauge:  18   Ultrasound guidance: yes     Puncture site:  L lower quadrant   Fluid removed amount:  3L   Fluid appearance:  Amber   Dressing:  4x4 sterile gauze and adhesive bandage Post-procedure  details:    Patient tolerance of procedure:  Tolerated well, no immediate complications    Critical Care performed: No ____________________________________________   INITIAL IMPRESSION / ASSESSMENT AND PLAN / ED COURSE  Pertinent labs & imaging results that were available during my care of the patient were reviewed by me and considered in my medical decision making (see chart for details).  47 y.o. male with a history of ESRD, cirrhosis, and multiple recent episodes of SBP presenting with diffuse severe abdominal pain, nausea and vomiting.  Overall, patient is very uncomfortable and I am concerned about recurrent SBP.  He completed his last course of antibiotics in November.  The patient is afebrile and has normal white blood cell count, so other etiologies are also possible including obstruction or partial obstruction, appendicitis, gallbladder disease, diverticulitis.  Plan CT abdomen, paracentesis, and initiation of symptomatic treatment.  I  anticipate admission.  9:54 PM The patient has undergone paracentesis with return of amber fluid.  He has been covered for SBP with Zosyn; no positive micro growth on prior cultures.    He tolerated the procedure well and continues to have pain/nausea but both of these are improved with symptomatic treatment.    Plan admission at this time. ____________________________________________  FINAL CLINICAL IMPRESSION(S) / ED DIAGNOSES  Final diagnoses:  Spontaneous bacterial peritonitis ([place])  Elevated lipase         NEW MEDICATIONS STARTED DURING THIS VISIT:  New Prescriptions   No medications on file      Eula Listen, MD 08/20/17 2154

## 2017-08-20 NOTE — ED Notes (Signed)
3L pulled off abdomin by Dr. Mariea Clonts

## 2017-08-20 NOTE — ED Triage Notes (Addendum)
Pt comes into the ED via POV c/o abdominal pain and left inguinal hernia.  Patient is a dialysis patient who went today and is still having severe pain.  Patient explains that he also has had a fever x4 days.  Patient not currently running a fever and last dose of tylenol was at 12:00.  Patient groaning in triage room at this time and unable to sit still.  Patient has been vomiting for the past couple of days as well.  Patient alert and oriented x4 and has even and unlabored respirations at this time. Patient states they took 3L off of him at dialysis today.

## 2017-08-20 NOTE — ED Notes (Addendum)
Pt reports he has a stomach pains, vomiting, and he has a hernia that is causing him pain as well. States its been a few days since he has eaten. Pt went to dialysis today. Had same symptoms 6 months ago. He came to the ED. Today in dialysis they took 6L of fluid and nurse told him he has more that needs to be taken.  Pt does not produce urine.

## 2017-08-21 ENCOUNTER — Inpatient Hospital Stay: Payer: Medicaid Other

## 2017-08-21 ENCOUNTER — Other Ambulatory Visit: Payer: Self-pay

## 2017-08-21 DIAGNOSIS — R1084 Generalized abdominal pain: Secondary | ICD-10-CM

## 2017-08-21 DIAGNOSIS — Z841 Family history of disorders of kidney and ureter: Secondary | ICD-10-CM | POA: Diagnosis not present

## 2017-08-21 DIAGNOSIS — K7210 Chronic hepatic failure without coma: Secondary | ICD-10-CM | POA: Diagnosis present

## 2017-08-21 DIAGNOSIS — F17210 Nicotine dependence, cigarettes, uncomplicated: Secondary | ICD-10-CM | POA: Diagnosis present

## 2017-08-21 DIAGNOSIS — K7031 Alcoholic cirrhosis of liver with ascites: Secondary | ICD-10-CM | POA: Diagnosis present

## 2017-08-21 DIAGNOSIS — D631 Anemia in chronic kidney disease: Secondary | ICD-10-CM | POA: Diagnosis present

## 2017-08-21 DIAGNOSIS — K652 Spontaneous bacterial peritonitis: Secondary | ICD-10-CM | POA: Diagnosis not present

## 2017-08-21 DIAGNOSIS — E869 Volume depletion, unspecified: Secondary | ICD-10-CM | POA: Diagnosis present

## 2017-08-21 DIAGNOSIS — N2581 Secondary hyperparathyroidism of renal origin: Secondary | ICD-10-CM | POA: Diagnosis present

## 2017-08-21 DIAGNOSIS — Z888 Allergy status to other drugs, medicaments and biological substances status: Secondary | ICD-10-CM | POA: Diagnosis not present

## 2017-08-21 DIAGNOSIS — I132 Hypertensive heart and chronic kidney disease with heart failure and with stage 5 chronic kidney disease, or end stage renal disease: Secondary | ICD-10-CM | POA: Diagnosis present

## 2017-08-21 DIAGNOSIS — I5042 Chronic combined systolic (congestive) and diastolic (congestive) heart failure: Secondary | ICD-10-CM | POA: Diagnosis present

## 2017-08-21 DIAGNOSIS — Z6822 Body mass index (BMI) 22.0-22.9, adult: Secondary | ICD-10-CM | POA: Diagnosis not present

## 2017-08-21 DIAGNOSIS — E440 Moderate protein-calorie malnutrition: Secondary | ICD-10-CM | POA: Diagnosis present

## 2017-08-21 DIAGNOSIS — Z992 Dependence on renal dialysis: Secondary | ICD-10-CM | POA: Diagnosis not present

## 2017-08-21 DIAGNOSIS — R109 Unspecified abdominal pain: Secondary | ICD-10-CM | POA: Diagnosis present

## 2017-08-21 DIAGNOSIS — Z79899 Other long term (current) drug therapy: Secondary | ICD-10-CM | POA: Diagnosis not present

## 2017-08-21 DIAGNOSIS — N186 End stage renal disease: Secondary | ICD-10-CM | POA: Diagnosis present

## 2017-08-21 DIAGNOSIS — F1010 Alcohol abuse, uncomplicated: Secondary | ICD-10-CM | POA: Diagnosis present

## 2017-08-21 LAB — CBC
HEMATOCRIT: 36.1 % — AB (ref 40.0–52.0)
HEMOGLOBIN: 11.9 g/dL — AB (ref 13.0–18.0)
MCH: 32.7 pg (ref 26.0–34.0)
MCHC: 33 g/dL (ref 32.0–36.0)
MCV: 99.1 fL (ref 80.0–100.0)
Platelets: 112 10*3/uL — ABNORMAL LOW (ref 150–440)
RBC: 3.65 MIL/uL — ABNORMAL LOW (ref 4.40–5.90)
RDW: 14.8 % — AB (ref 11.5–14.5)
WBC: 4.8 10*3/uL (ref 3.8–10.6)

## 2017-08-21 LAB — BASIC METABOLIC PANEL
ANION GAP: 11 (ref 5–15)
BUN: 14 mg/dL (ref 6–20)
CO2: 27 mmol/L (ref 22–32)
Calcium: 9.2 mg/dL (ref 8.9–10.3)
Chloride: 99 mmol/L — ABNORMAL LOW (ref 101–111)
Creatinine, Ser: 5.69 mg/dL — ABNORMAL HIGH (ref 0.61–1.24)
GFR calc Af Amer: 12 mL/min — ABNORMAL LOW (ref >60)
GFR calc non Af Amer: 11 mL/min — ABNORMAL LOW (ref >60)
GLUCOSE: 82 mg/dL (ref 65–99)
POTASSIUM: 4.6 mmol/L (ref 3.5–5.1)
Sodium: 137 mmol/L (ref 135–145)

## 2017-08-21 LAB — GLUCOSE, CAPILLARY
GLUCOSE-CAPILLARY: 62 mg/dL — AB (ref 65–99)
Glucose-Capillary: 117 mg/dL — ABNORMAL HIGH (ref 65–99)

## 2017-08-21 LAB — MRSA PCR SCREENING: MRSA by PCR: NEGATIVE

## 2017-08-21 MED ORDER — PANTOPRAZOLE SODIUM 40 MG PO TBEC
40.0000 mg | DELAYED_RELEASE_TABLET | Freq: Every day | ORAL | Status: DC
  Administered 2017-08-21 – 2017-08-23 (×3): 40 mg via ORAL
  Filled 2017-08-21 (×3): qty 1

## 2017-08-21 MED ORDER — ACETAMINOPHEN 325 MG PO TABS: 650.0000 mg | ORAL_TABLET | Freq: Four times a day (QID) | ORAL | Status: DC | PRN

## 2017-08-21 MED ORDER — HEPARIN SODIUM (PORCINE) 5000 UNIT/ML IJ SOLN
5000.0000 [IU] | Freq: Three times a day (TID) | INTRAMUSCULAR | Status: DC
  Administered 2017-08-21 – 2017-08-23 (×6): 5000 [IU] via SUBCUTANEOUS
  Filled 2017-08-21 (×6): qty 1

## 2017-08-21 MED ORDER — PIPERACILLIN-TAZOBACTAM 3.375 G IVPB
3.3750 g | Freq: Two times a day (BID) | INTRAVENOUS | Status: DC
  Administered 2017-08-21: 3.375 g via INTRAVENOUS
  Filled 2017-08-21 (×3): qty 50

## 2017-08-21 MED ORDER — HYDROCODONE-ACETAMINOPHEN 5-325 MG PO TABS
1.0000 | ORAL_TABLET | ORAL | Status: DC | PRN
  Administered 2017-08-21: 1 via ORAL
  Administered 2017-08-23: 2 via ORAL
  Filled 2017-08-21 (×2): qty 2
  Filled 2017-08-21: qty 1

## 2017-08-21 MED ORDER — ACETAMINOPHEN 650 MG RE SUPP: 650.0000 mg | Freq: Four times a day (QID) | RECTAL | Status: DC | PRN

## 2017-08-21 MED ORDER — ONDANSETRON HCL 4 MG/2ML IJ SOLN
4.0000 mg | Freq: Four times a day (QID) | INTRAMUSCULAR | Status: DC | PRN
  Administered 2017-08-21 – 2017-08-23 (×3): 4 mg via INTRAVENOUS
  Filled 2017-08-21 (×3): qty 2

## 2017-08-21 MED ORDER — VITAMIN C 500 MG PO TABS
500.0000 mg | ORAL_TABLET | Freq: Two times a day (BID) | ORAL | Status: DC
  Administered 2017-08-21 – 2017-08-23 (×4): 500 mg via ORAL
  Filled 2017-08-21 (×6): qty 1

## 2017-08-21 MED ORDER — SUCRALFATE 1 G PO TABS
1.0000 g | ORAL_TABLET | Freq: Four times a day (QID) | ORAL | Status: AC
  Administered 2017-08-21 – 2017-08-22 (×5): 1 g via ORAL
  Filled 2017-08-21 (×5): qty 1

## 2017-08-21 MED ORDER — DEXTROSE-NACL 5-0.9 % IV SOLN
INTRAVENOUS | Status: AC
  Administered 2017-08-21: 14:00:00 via INTRAVENOUS

## 2017-08-21 MED ORDER — HYDROMORPHONE HCL 1 MG/ML IJ SOLN
1.0000 mg | INTRAMUSCULAR | Status: DC | PRN
  Administered 2017-08-21 – 2017-08-22 (×6): 1 mg via INTRAVENOUS
  Filled 2017-08-21 (×6): qty 1

## 2017-08-21 MED ORDER — VITAMIN B-1 100 MG PO TABS
100.0000 mg | ORAL_TABLET | Freq: Every day | ORAL | Status: DC
  Administered 2017-08-21 – 2017-08-23 (×3): 100 mg via ORAL
  Filled 2017-08-21 (×3): qty 1

## 2017-08-21 MED ORDER — METOPROLOL TARTRATE 25 MG PO TABS
25.0000 mg | ORAL_TABLET | Freq: Two times a day (BID) | ORAL | Status: DC
  Administered 2017-08-21 – 2017-08-23 (×5): 25 mg via ORAL
  Filled 2017-08-21 (×5): qty 1

## 2017-08-21 MED ORDER — POLYETHYLENE GLYCOL 3350 17 G PO PACK
17.0000 g | PACK | Freq: Two times a day (BID) | ORAL | Status: DC
  Filled 2017-08-21 (×3): qty 1

## 2017-08-21 MED ORDER — DOCUSATE SODIUM 100 MG PO CAPS
100.0000 mg | ORAL_CAPSULE | Freq: Two times a day (BID) | ORAL | Status: DC
  Administered 2017-08-21 – 2017-08-23 (×5): 100 mg via ORAL
  Filled 2017-08-21 (×5): qty 1

## 2017-08-21 MED ORDER — DEXTROSE 5 % IV SOLN
2.0000 g | INTRAVENOUS | Status: DC
  Administered 2017-08-21 – 2017-08-23 (×3): 2 g via INTRAVENOUS
  Filled 2017-08-21 (×3): qty 2

## 2017-08-21 MED ORDER — BISACODYL 5 MG PO TBEC: 5.0000 mg | DELAYED_RELEASE_TABLET | Freq: Every day | ORAL | Status: DC | PRN

## 2017-08-21 MED ORDER — FERRIC CITRATE 1 GM 210 MG(FE) PO TABS
420.0000 mg | ORAL_TABLET | Freq: Three times a day (TID) | ORAL | Status: DC
  Administered 2017-08-21 – 2017-08-23 (×3): 420 mg via ORAL
  Filled 2017-08-21 (×7): qty 2

## 2017-08-21 MED ORDER — NEPRO/CARBSTEADY PO LIQD
237.0000 mL | Freq: Two times a day (BID) | ORAL | Status: DC
  Administered 2017-08-21 – 2017-08-23 (×2): 237 mL via ORAL

## 2017-08-21 MED ORDER — AMLODIPINE BESYLATE 10 MG PO TABS
10.0000 mg | ORAL_TABLET | Freq: Every day | ORAL | Status: DC
  Administered 2017-08-21 – 2017-08-23 (×3): 10 mg via ORAL
  Filled 2017-08-21 (×3): qty 1

## 2017-08-21 MED ORDER — ADULT MULTIVITAMIN LIQUID CH: 15.0000 mL | ORAL | Status: DC

## 2017-08-21 MED ORDER — ADULT MULTIVITAMIN W/MINERALS CH: 1.0000 | ORAL_TABLET | ORAL | Status: DC

## 2017-08-21 MED ORDER — ONDANSETRON HCL 4 MG PO TABS: 4.0000 mg | ORAL_TABLET | Freq: Four times a day (QID) | ORAL | Status: DC | PRN

## 2017-08-21 MED ORDER — RENA-VITE PO TABS
1.0000 | ORAL_TABLET | Freq: Every day | ORAL | Status: DC
  Administered 2017-08-21 – 2017-08-22 (×2): 1 via ORAL
  Filled 2017-08-21 (×3): qty 1

## 2017-08-21 NOTE — H&P (Signed)
Mario [name] at [place] NAME: Mario [name]    MR#:  [id number]  DATE OF BIRTH:  Dec 06, 1970  DATE OF ADMISSION:  08/20/2017  PRIMARY CARE PHYSICIAN: [name], MD   REQUESTING/REFERRING PHYSICIAN:   CHIEF COMPLAINT:   Chief Complaint  Patient presents with  . Abdominal Pain  . Hernia    HISTORY OF PRESENT ILLNESS: Mario [name]  is a 47 y.o. male with a known history of end-stage renal disease on hemodialysis and advanced liver cirrhosis. Patient was brought to emergency room for severe diffuse abdominal pain, associated with nausea and vomiting, going on for the past 3-4 days.  She denies having fever or chills at home.  No diarrhea, no bleeding.  He underwent paracentesis in the emergency room.  Abdominal CAT scan is significant for advanced liver cirrhosis findings.  The patient is admitted to rule out SBP.  PAST MEDICAL HISTORY:   Past Medical History:  Diagnosis Date  . Abdominal pain 08/28/2016  . Acute hyperkalemia 10/28/2016  . Chest pain 04/11/2016  . Chronic combined systolic and diastolic CHF (congestive heart failure) ([place])   . Cirrhosis ([place])   . Dialysis patient ([place])   . ESRD (end stage renal disease) on dialysis ([place]) 01/25/2015  . Fluid overload 04/12/2016  . HTN (hypertension) 10/13/2016  . Hyperkalemia 11/27/2016  . Hypertension   . Pulmonary edema 07/03/2016  . Renal disorder   . Renal insufficiency   . Scrotal infection 01/25/2015    PAST SURGICAL HISTORY:  Past Surgical History:  Procedure Laterality Date  . AV FISTULA PLACEMENT      SOCIAL HISTORY:  Social History   Tobacco Use  . Smoking status: Current Every Day Smoker    Packs/day: 0.25    Years: 25.00    Pack years: 6.25    Types: Cigarettes  . Smokeless tobacco: Never Used  Substance Use Topics  . Alcohol use: No    Comment: 1/2 bottle tequilla per day    FAMILY HISTORY:  Family History  Problem Relation Age of Onset  .  Kidney failure Father   . Diabetes Mother     DRUG ALLERGIES:  Allergies  Allergen Reactions  . Betadine [Povidone Iodine] Rash    REVIEW OF SYSTEMS:   CONSTITUTIONAL: No fever.  Patient complains of chronic fatigue and generalized weakness.  EYES: No blurred or double vision.  EARS, NOSE, AND THROAT: No tinnitus or ear pain.  RESPIRATORY: No cough, shortness of breath, wheezing or hemoptysis.  CARDIOVASCULAR: No chest pain, orthopnea, edema.  GASTROINTESTINAL: Positive for nausea, vomiting and abdominal pain.  GENITOURINARY: No dysuria, hematuria.  ENDOCRINE: No polyuria, nocturia,  HEMATOLOGY: Positive for easy bruising, no recent bleeding. SKIN: No rash or lesion. MUSCULOSKELETAL: Positive for generalized weakness and deconditioning.   NEUROLOGIC: No focal weakness.  PSYCHIATRY: No anxiety or depression.   MEDICATIONS AT HOME:  Prior to Admission medications   Medication Sig Start Date End Date Taking? Authorizing Provider  cefTAZidime 2 g in dextrose 5 % 50 mL Inject 2 g Every Tuesday,Thursday,and Saturday with dialysis into the vein. 06/13/17  Yes Fritzi Mandes, MD  ferric citrate (AURYXIA) 1 GM 210 MG(Fe) tablet Take 420 mg by mouth 3 (three) times daily with meals.   Yes [provider]  amLODipine (NORVASC) 10 MG tablet Take 1 tablet (10 mg total) by mouth daily. Patient not taking: Reported on 06/09/2017 08/29/16   Hillary Bow, MD  calcium acetate (PHOSLO) 667 MG  tablet Take 3 tablets (2,001 mg total) by mouth 3 (three) times daily with meals. Patient not taking: Reported on 06/12/2017 01/07/17   Henreitta Leber, MD  cholestyramine (QUESTRAN) 4 g packet Take 1 packet (4 g total) by mouth 3 (three) times daily with meals. Patient not taking: Reported on 06/04/2017 01/06/17   Lavonia Drafts, MD  ciprofloxacin (CIPRO) 500 MG tablet Take 1 tablet (500 mg total) by mouth at bedtime. Patient not taking: Reported on 02/25/2017 11/30/16   Loletha Grayer, MD  hydrOXYzine  (ATARAX/VISTARIL) 25 MG tablet Take 1 tablet (25 mg total) by mouth 3 (three) times daily as needed for itching. Patient not taking: Reported on 06/04/2017 01/14/17   Hinda Kehr, MD  metoprolol tartrate (LOPRESSOR) 25 MG tablet Take 1 tablet (25 mg total) by mouth 2 (two) times daily. Patient not taking: Reported on 08/20/2017 04/16/16   Henreitta Leber, MD  Multiple Vitamin (MULTIVITAMIN) LIQD Take 15 mLs 2 (two) times a week by mouth. Patient not taking: Reported on 08/20/2017 06/15/17   Fritzi Mandes, MD  Nutritional Supplements (FEEDING SUPPLEMENT, NEPRO CARB STEADY,) LIQD Take 237 mLs 3 (three) times daily between meals by mouth. Patient not taking: Reported on 08/20/2017 06/12/17   Fritzi Mandes, MD  ondansetron (ZOFRAN ODT) 4 MG disintegrating tablet Take 1 tablet (4 mg total) by mouth every 8 (eight) hours as needed for nausea or vomiting. Patient not taking: Reported on 08/20/2017 06/04/17   Eula Listen, MD  pantoprazole (PROTONIX) 40 MG tablet Take 1 tablet (40 mg total) by mouth daily. Patient not taking: Reported on 08/20/2017 10/14/16   Fritzi Mandes, MD      PHYSICAL EXAMINATION:   VITAL SIGNS: Blood pressure (!) 184/79, pulse 69, temperature 98 F (36.7 C), temperature source Oral, resp. rate 20, height 5' 6.93" (1.7 m), weight 69.9 kg (154 lb), SpO2 98 %.  GENERAL:  47 y.o.-year-old patient lying in the bed, and severe moderate distress, secondary to abdominal pain.  EYES: Scleral icterus noted. HEENT: Head atraumatic, normocephalic. Oropharynx and nasopharynx clear.  NECK: Positive for jugular venous distention. No thyroid enlargement, no tenderness.  LUNGS: Reduced breath sounds bilaterally.  No use of accessory muscles of respiration.  CARDIOVASCULAR: S1, S2 normal. No S3/S4.  ABDOMEN: Abdomen is distended and diffusely tender.  Massive ascites and splenomegaly noted.  EXTREMITIES: No pedal edema, cyanosis, or clubbing.  NEUROLOGIC: No focal weakness. Gait not checked.   PSYCHIATRIC: The patient is alert and oriented x 2.  SKIN: No obvious rash, lesion, or ulcer.   LABORATORY PANEL:   CBC Recent Labs  Lab 08/20/17 1929  WBC 5.1  HGB 12.8*  HCT 38.0*  PLT 122*  MCV 97.9  MCH 33.0  MCHC 33.7  RDW 15.0*   ------------------------------------------------------------------------------------------------------------------  Chemistries  Recent Labs  Lab 08/20/17 1929  NA 137  K 3.9  CL 99*  CO2 26  GLUCOSE 83  BUN 12  CREATININE 4.79*  CALCIUM 8.9  AST 27  ALT 15*  ALKPHOS 226*  BILITOT 1.4*   ------------------------------------------------------------------------------------------------------------------ estimated creatinine clearance is 17.8 mL/min (A) (by C-G formula based on SCr of 4.79 mg/dL (H)). ------------------------------------------------------------------------------------------------------------------ No results for input(s): TSH, T4TOTAL, T3FREE, THYROIDAB in the last 72 hours.  Invalid input(s): FREET3   Coagulation profile No results for input(s): INR, PROTIME in the last 168 hours. ------------------------------------------------------------------------------------------------------------------- No results for input(s): DDIMER in the last 72 hours. -------------------------------------------------------------------------------------------------------------------  Cardiac Enzymes Recent Labs  Lab 08/20/17 1929  TROPONINI <0.03   ------------------------------------------------------------------------------------------------------------------ Invalid input(s):  POCBNP  ---------------------------------------------------------------------------------------------------------------  Urinalysis No results found for: COLORURINE, APPEARANCEUR, LABSPEC, PHURINE, GLUCOSEU, HGBUR, BILIRUBINUR, KETONESUR, PROTEINUR, UROBILINOGEN, NITRITE, LEUKOCYTESUR   RADIOLOGY: Ct Abdomen Pelvis W Contrast  Result Date:  08/20/2017 CLINICAL DATA:  Abdominal pain and left inguinal hernia, fever EXAM: CT ABDOMEN AND PELVIS WITH CONTRAST TECHNIQUE: Multidetector CT imaging of the abdomen and pelvis was performed using the standard protocol following bolus administration of intravenous contrast. CONTRAST:  77mL ISOVUE-370 IOPAMIDOL (ISOVUE-370) INJECTION 76% COMPARISON:  06/04/2017, 02/25/2017, 10/13/2016, 01/04/2016 CT FINDINGS: Lower chest: No acute consolidation or pleural effusion. Mild cardiomegaly. Hepatobiliary: Cirrhotic liver, very coarse nodular appearance. No focal hepatic abnormality. No calcified gallstone or biliary dilatation Pancreas: Unremarkable. No pancreatic ductal dilatation or surrounding inflammatory changes. Spleen: Enlarged, measuring 16 cm on coronal views. Adrenals/Urinary Tract: Stable 13 mm right adrenal gland nodule. Left adrenal gland is normal. Atrophic kidneys. No hydronephrosis. Multiple cysts. 17 mm mildly dense lesion mid right kidney, increased density but decreased size. Bladder nearly empty Stomach/Bowel: Stomach nonenlarged. Prominent loop of small bowel in the left abdomen measuring up to 3.1 cm but no obstruction is seen. There is no colon wall thickening. Negative appendix. Vascular/Lymphatic: Non aneurysmal aorta. Calcifications in the wall of the vena cava and iliac veins. Portal and splenic veins are patent. Dilated tortuous azygos system. Large distal esophageal varices. Varices in the upper abdomen. Prominent right perirectal vessels read demonstrated. Recanalized umbilical vein. No significantly enlarged lymph nodes. Reproductive: Prostate is unremarkable. Other: Negative for free air. Moderate to large volume of ascites in the abdomen and pelvis. Musculoskeletal: No acute or significant osseous findings. IMPRESSION: 1. Cirrhosis of the liver with portal hypertension as evidenced by splenomegaly and multiple upper abdominal varices and portosystemic shunts. 2. Moderate to large volume of  ascites in the abdomen and pelvis. 3. Mildly enlarged loop of small bowel in the left upper quadrant but no definitive bowel obstruction. 4. Atrophic kidneys. No hydronephrosis. Multiple cysts in the kidneys. 5. Stable right adrenal gland nodule Electronically Signed   By: Donavan Foil M.D.   On: 08/20/2017 21:31    EKG: Orders placed or performed during the hospital encounter of 08/20/17  . ED EKG  . ED EKG  . EKG 12-Lead  . EKG 12-Lead    IMPRESSION AND PLAN:  1.  Acute severe abdominal pain.  Will rule out SBP.  Patient is started on broad coverage antibiotic therapy; ascites fluid was sent for culture.  Will apply symptomatic treatment and will continue to monitor clinically closely. 2.  Advanced liver cirrhosis.  Overall, patient has a very poor long-term prognosis, will consider palliative care consult. 3.  End-stage renal disease on dialysis, continue therapy. 4.  Chronic hepatic encephalopathy, secondary to liver cirrhosis, stable continue to monitor clinically.  All the records are reviewed and case discussed with ED provider. Management plans discussed with the patient, family and they are in agreement.  CODE STATUS:    Code Status Orders  (From admission, onward)        Start     Ordered   08/21/17 0209  Full code  Continuous     08/21/17 0208    Code Status History    Date Active Date Inactive Code Status Order ID Comments User Context   06/09/2017 20:48 06/13/2017 19:07 Full Code [id number]  Dustin Flock, MD Inpatient   01/06/2017 14:37 01/07/2017 17:44 Full Code [id number]  Epifanio Lesches, MD ED   11/27/2016 14:31 11/30/2016 14:23 Full Code [id number]  Henreitta Leber, MD Inpatient  10/28/2016 16:11 10/31/2016 16:38 Full Code [id number]  Epifanio Lesches, MD ED   10/13/2016 21:36 10/14/2016 21:23 Full Code [id number]  Lance Coon, MD Inpatient   08/28/2016 16:58 08/30/2016 17:19 Full Code [id number]  Fritzi Mandes, MD ED   07/03/2016 20:06 07/06/2016 16:22 Full Code  [id number]  Fritzi Mandes, MD ED   04/11/2016 18:57 04/12/2016 07:16 Full Code [id number]  Vaughan Basta, MD Inpatient   01/25/2015 12:29 01/28/2015 16:26 Full Code [id number]  Dustin Flock, MD Inpatient       TOTAL TIME TAKING CARE OF THIS PATIENT: 40 minutes.    Amelia Jo M.D on 08/21/2017 at 4:11 AM  Between 7am to 6pm - Pager - [phone]  After 6pm go to [email or web address] - password [email or web address] [facility]  Office  [phone]  CC: Primary care physician; [name], MD

## 2017-08-21 NOTE — Progress Notes (Signed)
[place] at [place] NAME: Mario [name]    MR#:  [id number]  DATE OF BIRTH:  03-Jun-1971  SUBJECTIVE:  CHIEF COMPLAINT:   Chief Complaint  Patient presents with  . Abdominal Pain  . Hernia   - Came in with abdominal pain and also complaining of left thigh pain. Feels like he has a left femoral hernia. Nothing visible on exam. -A Spanish interpreter was used to communicate  REVIEW OF SYSTEMS:  Review of Systems  Constitutional: Negative for chills, fever and malaise/fatigue.  HENT: Negative for congestion, hearing loss and nosebleeds.   Eyes: Negative for blurred vision and double vision.  Respiratory: Negative for cough, shortness of breath and wheezing.   Cardiovascular: Negative for chest pain and palpitations.  Gastrointestinal: Negative for abdominal pain, constipation, diarrhea, nausea and vomiting.  Genitourinary: Negative for dysuria and urgency.  Musculoskeletal: Positive for back pain and myalgias.  Neurological: Negative for dizziness, speech change, focal weakness, seizures and headaches.  Psychiatric/Behavioral: Negative for depression.    DRUG ALLERGIES:   Allergies  Allergen Reactions  . Betadine [Povidone Iodine] Rash    VITALS:  Blood pressure (!) 188/71, pulse 65, temperature 97.6 F (36.4 C), temperature source Oral, resp. rate 16, height 5' 6.93" (1.7 m), weight 66.5 kg (146 lb 8 oz), SpO2 98 %.  PHYSICAL EXAMINATION:  Physical Exam  GENERAL:  47 y.o.-year-old patient lying in the bed with no acute distress. Generalized body aches EYES: Pupils equal, round, reactive to light and accommodation. No scleral icterus. Extraocular muscles intact.  HEENT: Head atraumatic, normocephalic. Oropharynx and nasopharynx clear.  NECK:  Supple, no jugular venous distention. No thyroid enlargement, no tenderness.  LUNGS: Normal breath sounds bilaterally, no wheezing, rales,rhonchi or crepitation. No use of accessory  muscles of respiration.  CARDIOVASCULAR: S1, S2 normal. No murmurs, rubs, or gallops.  ABDOMEN: Soft, nontender, nondistended. Bowel sounds present. No organomegaly or mass.  EXTREMITIES: No pedal edema, cyanosis, or clubbing. Left upper arm AV fistula with good thrill NEUROLOGIC: Cranial nerves II through XII are intact. Muscle strength 5/5 in all extremities. Sensation intact. Gait not checked. Generalized weakness. PSYCHIATRIC: The patient is alert and oriented x 3.  SKIN: No obvious rash, lesion, or ulcer.    LABORATORY PANEL:   CBC Recent Labs  Lab 08/21/17 0505  WBC 4.8  HGB 11.9*  HCT 36.1*  PLT 112*   ------------------------------------------------------------------------------------------------------------------  Chemistries  Recent Labs  Lab 08/20/17 1929 08/21/17 0505  NA 137 137  K 3.9 4.6  CL 99* 99*  CO2 26 27  GLUCOSE 83 82  BUN 12 14  CREATININE 4.79* 5.69*  CALCIUM 8.9 9.2  AST 27  --   ALT 15*  --   ALKPHOS 226*  --   BILITOT 1.4*  --    ------------------------------------------------------------------------------------------------------------------  Cardiac Enzymes Recent Labs  Lab 08/20/17 1929  TROPONINI <0.03   ------------------------------------------------------------------------------------------------------------------  RADIOLOGY:  Ct Abdomen Pelvis W Contrast  Result Date: 08/20/2017 CLINICAL DATA:  Abdominal pain and left inguinal hernia, fever EXAM: CT ABDOMEN AND PELVIS WITH CONTRAST TECHNIQUE: Multidetector CT imaging of the abdomen and pelvis was performed using the standard protocol following bolus administration of intravenous contrast. CONTRAST:  11mL ISOVUE-370 IOPAMIDOL (ISOVUE-370) INJECTION 76% COMPARISON:  06/04/2017, 02/25/2017, 10/13/2016, 01/04/2016 CT FINDINGS: Lower chest: No acute consolidation or pleural effusion. Mild cardiomegaly. Hepatobiliary: Cirrhotic liver, very coarse nodular appearance. No focal hepatic  abnormality. No calcified gallstone or biliary dilatation Pancreas: Unremarkable. No pancreatic  ductal dilatation or surrounding inflammatory changes. Spleen: Enlarged, measuring 16 cm on coronal views. Adrenals/Urinary Tract: Stable 13 mm right adrenal gland nodule. Left adrenal gland is normal. Atrophic kidneys. No hydronephrosis. Multiple cysts. 17 mm mildly dense lesion mid right kidney, increased density but decreased size. Bladder nearly empty Stomach/Bowel: Stomach nonenlarged. Prominent loop of small bowel in the left abdomen measuring up to 3.1 cm but no obstruction is seen. There is no colon wall thickening. Negative appendix. Vascular/Lymphatic: Non aneurysmal aorta. Calcifications in the wall of the vena cava and iliac veins. Portal and splenic veins are patent. Dilated tortuous azygos system. Large distal esophageal varices. Varices in the upper abdomen. Prominent right perirectal vessels read demonstrated. Recanalized umbilical vein. No significantly enlarged lymph nodes. Reproductive: Prostate is unremarkable. Other: Negative for free air. Moderate to large volume of ascites in the abdomen and pelvis. Musculoskeletal: No acute or significant osseous findings. IMPRESSION: 1. Cirrhosis of the liver with portal hypertension as evidenced by splenomegaly and multiple upper abdominal varices and portosystemic shunts. 2. Moderate to large volume of ascites in the abdomen and pelvis. 3. Mildly enlarged loop of small bowel in the left upper quadrant but no definitive bowel obstruction. 4. Atrophic kidneys. No hydronephrosis. Multiple cysts in the kidneys. 5. Stable right adrenal gland nodule Electronically Signed   By: Donavan Foil M.D.   On: 08/20/2017 21:31   US Abdomen Limited  Result Date: 08/21/2017 CLINICAL DATA:  History of cirrhosis, now with symptomatic ascites, post paracentesis performed last evening in the emergency department. EXAM: LIMITED ABDOMEN ULTRASOUND FOR ASCITES TECHNIQUE: Limited  ultrasound survey for ascites was performed in all four abdominal quadrants. COMPARISON:  None. FINDINGS: Sonographic evaluation of the abdomen demonstrates only a trace amount of residual intra-abdominal ascites within the right lower abdominal quadrant, too small to allow for safe ultrasound-guided paracentesis. IMPRESSION: Trace amount of intra-abdominal ascites following bedside paracentesis performed in the emergency room last evening. As such, no paracentesis was attempted. Electronically Signed   By: Sandi Mariscal M.D.   On: 08/21/2017 12:54    EKG:   Orders placed or performed during the hospital encounter of 08/20/17  . ED EKG  . ED EKG  . EKG 12-Lead  . EKG 12-Lead    ASSESSMENT AND PLAN:   48 year old with liver cirrhosis and end-stage renal disease on hemodialysis admitted  with abdominal pain  1.Abdominal pain- from ascites - rule out Spontaneous bacterial peritonitis: ascitic fluid 3L drained out and has few wbcs with only 8% neutrophils - cultures are pending - on IV Rocephin  - pain meds prn  2.End-stage renal disease- Tue,Thurs, Sat dialysis Nephrology following for hemodialysis - for dialysis tomorrow  3.Essential hypertension Continue amlodipine and metoprolol  4. Alcoholic liver cirrhosis-patient states he has quit drinking almost a year ago now. -No encephalopathy noted.  5. DVT prophylaxis-subcutaneous heparin       All the records are reviewed and case discussed with Care Management/Social Workerr. Management plans discussed with the patient, family and they are in agreement.  CODE STATUS: Full Code  TOTAL TIME TAKING CARE OF THIS PATIENT: 39 minutes.   POSSIBLE D/C IN 1-2 DAYS, DEPENDING ON CLINICAL CONDITION.   Gladstone Lighter M.D on 08/21/2017 at 3:28 PM  Between 7am to 6pm - Pager - [phone]  After 6pm go to [email or web address] - Proofreader  [facility]  Office  [phone]  CC: Primary care  physician; [name], MD

## 2017-08-21 NOTE — Consult Note (Addendum)
Vonda Antigua, MD [address], [place], [place], [place], [address] [address] [place], [place], [place], [address] Phone: [phone]  Fax: [phone]  Consultation  Referring Provider:     Dr. Su Monks Primary Care Physician:  [name], MD Primary Gastroenterologist:  Virgel Manifold, MD        Reason for Consultation:     Abdominal pain  Date of Admission:  08/20/2017 Date of Consultation:  08/21/2017         HPI:   Mario [name] is a 47 y.o. male with history of daily alcohol use and cirrhosis presents with abdominal pain and left groin pain.  Patient's reports diffuse cramping abdominal pain for 4 days.  Reports sharp left groin pain for the same period of time.  Denies any nausea vomiting.  Denies any altered bowel habits.  Denies any episodes of GI bleeding.  Does not follow-up with a primary care provider or GI physician.  Patient had diagnostic paracentesis in the ER yesterday.  He has been started on antibiotics prophylactically for SBP.  Ultrasound paracentesis was attempted today but not all falling for therapeutic paracentesis.  CT abdomen yesterday showed cirrhotic liver, no focal hepatic abnormality.  No calcified gallstones or biliary dilation.  Pancreas unremarkable.  Prominent loops of small bowel was not reported in the left abdomen measuring up to 3.1 cm but no obstruction seen.  No colon wall thickening.  Lipase is mildly elevated at 85 on admission.  Hemoglobin is at baseline around 12.  Alk phos is elevated to 226 but was previously elevated to 179, 2 months ago.  Total bilirubin is mildly elevated to 1.4.  Transaminases are normal.  Past Medical History:  Diagnosis Date  . Abdominal pain 08/28/2016  . Acute hyperkalemia 10/28/2016  . Chest pain 04/11/2016  . Chronic combined systolic and diastolic CHF (congestive heart failure) ([place])   . Cirrhosis ([place])   . Dialysis patient ([place])   . ESRD (end stage renal disease) on dialysis ([place])  01/25/2015  . Fluid overload 04/12/2016  . HTN (hypertension) 10/13/2016  . Hyperkalemia 11/27/2016  . Hypertension   . Pulmonary edema 07/03/2016  . Renal disorder   . Renal insufficiency   . Scrotal infection 01/25/2015    Past Surgical History:  Procedure Laterality Date  . AV FISTULA PLACEMENT      Prior to Admission medications   Medication Sig Start Date End Date Taking? Authorizing Provider  cefTAZidime 2 g in dextrose 5 % 50 mL Inject 2 g Every Tuesday,Thursday,and Saturday with dialysis into the vein. 06/13/17  Yes Fritzi Mandes, MD  ferric citrate (AURYXIA) 1 GM 210 MG(Fe) tablet Take 420 mg by mouth 3 (three) times daily with meals.   Yes [provider]  amLODipine (NORVASC) 10 MG tablet Take 1 tablet (10 mg total) by mouth daily. Patient not taking: Reported on 06/09/2017 08/29/16   Hillary Bow, MD  calcium acetate (PHOSLO) 667 MG tablet Take 3 tablets (2,001 mg total) by mouth 3 (three) times daily with meals. Patient not taking: Reported on 06/12/2017 01/07/17   Henreitta Leber, MD  cholestyramine (QUESTRAN) 4 g packet Take 1 packet (4 g total) by mouth 3 (three) times daily with meals. Patient not taking: Reported on 06/04/2017 01/06/17   Lavonia Drafts, MD  ciprofloxacin (CIPRO) 500 MG tablet Take 1 tablet (500 mg total) by mouth at bedtime. Patient not taking: Reported on 02/25/2017 11/30/16   Loletha Grayer, MD  hydrOXYzine (ATARAX/VISTARIL) 25 MG tablet  Take 1 tablet (25 mg total) by mouth 3 (three) times daily as needed for itching. Patient not taking: Reported on 06/04/2017 01/14/17   Hinda Kehr, MD  metoprolol tartrate (LOPRESSOR) 25 MG tablet Take 1 tablet (25 mg total) by mouth 2 (two) times daily. Patient not taking: Reported on 08/20/2017 04/16/16   Henreitta Leber, MD  Multiple Vitamin (MULTIVITAMIN) LIQD Take 15 mLs 2 (two) times a week by mouth. Patient not taking: Reported on 08/20/2017 06/15/17   Fritzi Mandes, MD  Nutritional Supplements (FEEDING  SUPPLEMENT, NEPRO CARB STEADY,) LIQD Take 237 mLs 3 (three) times daily between meals by mouth. Patient not taking: Reported on 08/20/2017 06/12/17   Fritzi Mandes, MD  ondansetron (ZOFRAN ODT) 4 MG disintegrating tablet Take 1 tablet (4 mg total) by mouth every 8 (eight) hours as needed for nausea or vomiting. Patient not taking: Reported on 08/20/2017 06/04/17   Eula Listen, MD  pantoprazole (PROTONIX) 40 MG tablet Take 1 tablet (40 mg total) by mouth daily. Patient not taking: Reported on 08/20/2017 10/14/16   Fritzi Mandes, MD    Family History  Problem Relation Age of Onset  . Kidney failure Father   . Diabetes Mother      Social History   Tobacco Use  . Smoking status: Current Every Day Smoker    Packs/day: 0.25    Years: 25.00    Pack years: 6.25    Types: Cigarettes  . Smokeless tobacco: Never Used  Substance Use Topics  . Alcohol use: No    Comment: 1/2 bottle tequilla per day  . Drug use: No    Allergies as of 08/20/2017 - Review Complete 08/20/2017  Allergen Reaction Noted  . Betadine [povidone iodine] Rash 01/06/2017    Review of Systems:    All systems reviewed and negative except where noted in HPI.   Physical Exam:  Vital signs in last 24 hours: Vitals:   08/21/17 0130 08/21/17 0217 08/21/17 0800 08/21/17 1227  BP: (!) 165/76 (!) 184/79 (!) 188/71   Pulse: 70 69 65   Resp: '15 20 16   ' Temp:  98 F (36.7 C) 97.6 F (36.4 C)   TempSrc:   Oral   SpO2: 97% 98% 98%   Weight:    146 lb 8 oz (66.5 kg)  Height:       Last BM Date: 08/20/17 General:   Pleasant, cooperative in NAD Head:  Normocephalic and atraumatic. Eyes:   No icterus.   Conjunctiva pink. PERRLA. Ears:  Normal auditory acuity. Neck:  Supple; no masses or thyroidomegaly Lungs: Respirations even and unlabored. Lungs clear to auscultation bilaterally.   No wheezes, crackles, or rhonchi.  Heart:  Regular rate and rhythm;  Without murmur, clicks, rubs or gallops Abdomen:  Soft,  nondistended, when patient is distracted while on asking questions, he does not grimace while on palpating his abdomen.  When I specifically asked him if he has any pain while palpation he responds yes, diffusely.  Normal bowel sounds. No appreciable masses or hepatomegaly.  No rebound or guarding.  Neurologic:  Alert and oriented x3;  grossly normal neurologically. Skin:  Intact without significant lesions or rashes. Cervical Nodes:  No significant cervical adenopathy. Psych:  Alert and cooperative. Normal affect.  LAB RESULTS: Recent Labs    08/20/17 1929 08/21/17 0505  WBC 5.1 4.8  HGB 12.8* 11.9*  HCT 38.0* 36.1*  PLT 122* 112*   BMET Recent Labs    08/20/17 1929 08/21/17 0505  NA 137  137  K 3.9 4.6  CL 99* 99*  CO2 26 27  GLUCOSE 83 82  BUN 12 14  CREATININE 4.79* 5.69*  CALCIUM 8.9 9.2   LFT Recent Labs    08/20/17 1929  PROT 7.4  ALBUMIN 3.8  AST 27  ALT 15*  ALKPHOS 226*  BILITOT 1.4*   PT/INR No results for input(s): LABPROT, INR in the last 72 hours.  STUDIES: Ct Abdomen Pelvis W Contrast  Result Date: 08/20/2017 CLINICAL DATA:  Abdominal pain and left inguinal hernia, fever EXAM: CT ABDOMEN AND PELVIS WITH CONTRAST TECHNIQUE: Multidetector CT imaging of the abdomen and pelvis was performed using the standard protocol following bolus administration of intravenous contrast. CONTRAST:  66m ISOVUE-370 IOPAMIDOL (ISOVUE-370) INJECTION 76% COMPARISON:  06/04/2017, 02/25/2017, 10/13/2016, 01/04/2016 CT FINDINGS: Lower chest: No acute consolidation or pleural effusion. Mild cardiomegaly. Hepatobiliary: Cirrhotic liver, very coarse nodular appearance. No focal hepatic abnormality. No calcified gallstone or biliary dilatation Pancreas: Unremarkable. No pancreatic ductal dilatation or surrounding inflammatory changes. Spleen: Enlarged, measuring 16 cm on coronal views. Adrenals/Urinary Tract: Stable 13 mm right adrenal gland nodule. Left adrenal gland is normal.  Atrophic kidneys. No hydronephrosis. Multiple cysts. 17 mm mildly dense lesion mid right kidney, increased density but decreased size. Bladder nearly empty Stomach/Bowel: Stomach nonenlarged. Prominent loop of small bowel in the left abdomen measuring up to 3.1 cm but no obstruction is seen. There is no colon wall thickening. Negative appendix. Vascular/Lymphatic: Non aneurysmal aorta. Calcifications in the wall of the vena cava and iliac veins. Portal and splenic veins are patent. Dilated tortuous azygos system. Large distal esophageal varices. Varices in the upper abdomen. Prominent right perirectal vessels read demonstrated. Recanalized umbilical vein. No significantly enlarged lymph nodes. Reproductive: Prostate is unremarkable. Other: Negative for free air. Moderate to large volume of ascites in the abdomen and pelvis. Musculoskeletal: No acute or significant osseous findings. IMPRESSION: 1. Cirrhosis of the liver with portal hypertension as evidenced by splenomegaly and multiple upper abdominal varices and portosystemic shunts. 2. Moderate to large volume of ascites in the abdomen and pelvis. 3. Mildly enlarged loop of small bowel in the left upper quadrant but no definitive bowel obstruction. 4. Atrophic kidneys. No hydronephrosis. Multiple cysts in the kidneys. 5. Stable right adrenal gland nodule Electronically Signed   By: KDonavan FoilM.D.   On: 08/20/2017 21:31   UKoreaAbdomen Limited  Result Date: 08/21/2017 CLINICAL DATA:  History of cirrhosis, now with symptomatic ascites, post paracentesis performed last evening in the emergency department. EXAM: LIMITED ABDOMEN ULTRASOUND FOR ASCITES TECHNIQUE: Limited ultrasound survey for ascites was performed in all four abdominal quadrants. COMPARISON:  None. FINDINGS: Sonographic evaluation of the abdomen demonstrates only a trace amount of residual intra-abdominal ascites within the right lower abdominal quadrant, too small to allow for safe  ultrasound-guided paracentesis. IMPRESSION: Trace amount of intra-abdominal ascites following bedside paracentesis performed in the emergency room last evening. As such, no paracentesis was attempted. Electronically Signed   By: JSandi MariscalM.D.   On: 08/21/2017 12:54      Impression / Plan:   JChristina Waldropis a 47y.o. y/o male with alcoholic liver cirrhosis presents with abdominal pain and left groin pain  When specifically asked about his pain, patient states his pain is more in his left groin than in his abdomen When I palpated his left groin it is tender He states he thinks he has had a groin hernia in the past Since he reports tenderness in the area,  would recommend primary team to explore this further and obtain surgical evaluation for the same  Fluid studies do not show any evidence of SBP since PMN is less than 250. Follow up cultures and if no growth can d/c antibiotics. CT does not report any biliary duct dilation.  There is a prominent loop of small bowel reported in the left abdomen.  This is likely nonspecific.  Please obtain inflammatory markers, ESR, CRP with next labs CT images reviewed and shows significant stool burden.  His abdominal pain could be due to constipation.  Would recommend MiraLAX daily.  If no bowel movement by tomorrow, administer 1-2 tapwater enemas until large bowel movement  Liver enzymes are mildly elevated, likely chronically due to his alcohol abuse.  Would recommend repeating CMP with morning labs. No evidence of decompensation of cirrhosis Avoid hepatotoxic drugs No evidence of biliary obstruction His abdominal exam is benign.  When he was distracted and I palpated his abdomen he did not elicit any pain.  However, when I specifically asked him if he had pain while palpating his abdomen he answered yes.  Primary team has started Protonix daily, located continue to monitor for improvement.  Patient will need outpatient follow-up in GI clinic for  cirrhosis management, discussion for variceal screening and further maintenance for cirrhosis  CIWA protocol Folate thiamine Continue to encourage abstinence Consider social work consult to help patient with his appointments possible financial aid to help him keep his appointments  Thank you for involving me in the care of this patient.      LOS: 0 days   Virgel Manifold, MD  08/21/2017, 3:23 PM

## 2017-08-21 NOTE — Consult Note (Signed)
Pharmacy Antibiotic Note  Mario [name] is a 47 y.o. male admitted on 08/20/2017 with Intra-abdominal Infection. Pharmacy has been consulted for Ceftriaxone dosing.  Plan: Start Ceftriaxone 2g IV every 24 hours   Height: 5' 6.93" (170 cm) Weight: 154 lb (69.9 kg) IBW/kg (Calculated) : 65.94  Temp (24hrs), Avg:97.9 F (36.6 C), Min:97.6 F (36.4 C), Max:98 F (36.7 C)  Recent Labs  Lab 08/20/17 1929 08/21/17 0505  WBC 5.1 4.8  CREATININE 4.79* 5.69*    Estimated Creatinine Clearance: 15 mL/min (A) (by C-G formula based on SCr of 5.69 mg/dL (H)).    Allergies  Allergen Reactions  . Betadine [Povidone Iodine] Rash    Antimicrobials this admission: 1/17 Zosyn >> 1/18 1/18 Ceftriaxone  >>   Dose adjustments this admission:  Microbiology results: 1/17 BCx: pending 1/18  MRSA PCR: Negative   Thank you for allowing pharmacy to be a part of this patient's care.  Pernell Dupre, PharmD, BCPS Clinical Pharmacist 08/21/2017 10:31 AM

## 2017-08-21 NOTE — Care Management (Signed)
Patient admitted with abdominal pain.  Self pay patient. [facility] HD liaison notified of admission.  Previous admission patient was provided with Spanish application to [facility], Medication Management, and Spanish "The Network:  Your Guide to [employer] and [employer] in [facility]"  Booklet.  RNCM following for discharge medication needs

## 2017-08-21 NOTE — Progress Notes (Signed)
[facility], [place] 08/21/17  Subjective:   Patient interviewed through a Spanish interpreter Patient known to our practice from previous admissions.  He presents this time for diffuse abdominal pain.  He states he has not been able to eat for the past 4 days.  He had a bowel movement yesterday.  He does not void.  His last dialysis was yesterday.  He states he goes to dialysis TTS at [facility] on [address]  Objective:  Vital signs in last 24 hours:  Temp:  [97.6 F (36.4 C)-98 F (36.7 C)] 97.6 F (36.4 C) (01/18 0800) Pulse Rate:  [63-77] 65 (01/18 0800) Resp:  [10-22] 16 (01/18 0800) BP: (108-188)/(46-92) 188/71 (01/18 0800) SpO2:  [94 %-100 %] 98 % (01/18 0800) Weight:  [69.9 kg (154 lb)] 69.9 kg (154 lb) (01/17 1926)  Weight change:  Filed Weights   08/20/17 1926  Weight: 69.9 kg (154 lb)    Intake/Output:   No intake or output data in the 24 hours ending 08/21/17 0959   Physical Exam: General:  Chronically ill-appearing, laying in the bed  HEENT  moist oral mucous membranes  Neck  no distended neck veins  Pulm/lungs  normal breathing effort, clear to auscultation  CVS/Heart  regular rhythm, soft systolic murmur  Abdomen:   Soft, mild diffuse tenderness especially midepigastric area  Extremities:  No peripheral edema  Neurologic:  Alert, oriented  Skin:  Dry skin  Access:  Left upper extremity AV fistula       Basic Metabolic Panel:  Recent Labs  Lab 08/20/17 1929 08/21/17 0505  NA 137 137  K 3.9 4.6  CL 99* 99*  CO2 26 27  GLUCOSE 83 82  BUN 12 14  CREATININE 4.79* 5.69*  CALCIUM 8.9 9.2     CBC: Recent Labs  Lab 08/20/17 1929 08/21/17 0505  WBC 5.1 4.8  HGB 12.8* 11.9*  HCT 38.0* 36.1*  MCV 97.9 99.1  PLT 122* 112*      Lab Results  Component Value Date   HEPBSAG Negative 01/06/2017      Microbiology:  Recent Results (from the past 240 hour(s))  Blood culture (routine x 2)     Status: None  (Preliminary result)   Collection Time: 08/20/17  8:22 PM  Result Value Ref Range Status   Specimen Description BLOOD RFOA  Final   Special Requests   Final    BOTTLES DRAWN AEROBIC AND ANAEROBIC Blood Culture adequate volume   Culture   Final    NO GROWTH < 12 HOURS Performed at [facility], [address] [place], [place] [address]    Report Status PENDING  Incomplete  Body fluid culture ( includes gram stain)     Status: None (Preliminary result)   Collection Time: 08/20/17  8:25 PM  Result Value Ref Range Status   Specimen Description   Final    PERITONEAL CAVITY Performed at [facility], [address], [place], [place] [address]    Special Requests   Final    NONE Performed at [facility], [address] [place], [place] [address]    Gram Stain   Final    FEW WBC PRESENT, PREDOMINANTLY MONONUCLEAR NO ORGANISMS SEEN Performed at [facility], [place] [address], [place],  [address]    Culture PENDING  Incomplete   Report Status PENDING  Incomplete  Blood culture (routine x 2)     Status: None (Preliminary result)  Collection Time: 08/20/17  8:25 PM  Result Value Ref Range Status   Specimen Description BLOOD RIGHT WRIST  Final   Special Requests   Final    BOTTLES DRAWN AEROBIC AND ANAEROBIC Blood Culture adequate volume   Culture   Final    NO GROWTH < 12 HOURS Performed at [facility], [address] [place], [place] [address]    Report Status PENDING  Incomplete  MRSA PCR Screening     Status: None   Collection Time: 08/21/17  2:20 AM  Result Value Ref Range Status   MRSA by PCR NEGATIVE NEGATIVE Final    Comment:        The GeneXpert MRSA Assay (FDA approved for NASAL specimens only), is one component of a comprehensive MRSA colonization surveillance program. It is not intended to diagnose MRSA infection nor to guide or monitor treatment for MRSA infections. Performed at [facility], [address] [place], [place] [address]     Coagulation Studies: No results for input(s): LABPROT, INR in the last 72 hours.  Urinalysis: No results for input(s): COLORURINE, LABSPEC, PHURINE, GLUCOSEU, HGBUR, BILIRUBINUR, KETONESUR, PROTEINUR, UROBILINOGEN, NITRITE, LEUKOCYTESUR in the last 72 hours.  Invalid input(s): APPERANCEUR    Imaging: Ct Abdomen Pelvis W Contrast  Result Date: 08/20/2017 CLINICAL DATA:  Abdominal pain and left inguinal hernia, fever EXAM: CT ABDOMEN AND PELVIS WITH CONTRAST TECHNIQUE: Multidetector CT imaging of the abdomen and pelvis was performed using the standard protocol following bolus administration of intravenous contrast. CONTRAST:  25mL ISOVUE-370 IOPAMIDOL (ISOVUE-370) INJECTION 76% COMPARISON:  06/04/2017, 02/25/2017, 10/13/2016, 01/04/2016 CT FINDINGS: Lower chest: No acute consolidation or pleural effusion. Mild cardiomegaly. Hepatobiliary: Cirrhotic liver, very coarse nodular appearance. No focal hepatic abnormality. No calcified gallstone or biliary dilatation Pancreas: Unremarkable. No pancreatic ductal dilatation or surrounding inflammatory changes. Spleen: Enlarged, measuring 16 cm on coronal views. Adrenals/Urinary Tract: Stable 13 mm right adrenal gland nodule. Left adrenal gland is normal. Atrophic kidneys. No hydronephrosis. Multiple cysts. 17 mm mildly dense lesion mid right kidney, increased density but decreased size. Bladder nearly empty Stomach/Bowel: Stomach nonenlarged. Prominent loop of small bowel in the left abdomen measuring up to 3.1 cm but no obstruction is seen. There is no colon wall thickening. Negative appendix. Vascular/Lymphatic: Non aneurysmal aorta. Calcifications in the wall of the vena cava and iliac veins. Portal and splenic veins are patent. Dilated tortuous azygos system. Large distal esophageal varices. Varices in the upper abdomen. Prominent right perirectal vessels read demonstrated. Recanalized umbilical vein. No  significantly enlarged lymph nodes. Reproductive: Prostate is unremarkable. Other: Negative for free air. Moderate to large volume of ascites in the abdomen and pelvis. Musculoskeletal: No acute or significant osseous findings. IMPRESSION: 1. Cirrhosis of the liver with portal hypertension as evidenced by splenomegaly and multiple upper abdominal varices and portosystemic shunts. 2. Moderate to large volume of ascites in the abdomen and pelvis. 3. Mildly enlarged loop of small bowel in the left upper quadrant but no definitive bowel obstruction. 4. Atrophic kidneys. No hydronephrosis. Multiple cysts in the kidneys. 5. Stable right adrenal gland nodule Electronically Signed   By: Donavan Foil M.D.   On: 08/20/2017 21:31     Medications:   . piperacillin-tazobactam 3.375 g (08/21/17 0919)   . docusate sodium  100 mg Oral BID  . heparin  5,000 Units Subcutaneous Q8H  . metoprolol tartrate  25 mg Oral BID  . [START ON 08/24/2017] multivitamin  15 mL Oral Once per day on Mon Thu  .  pantoprazole  40 mg Oral Daily   acetaminophen **OR** acetaminophen, bisacodyl, HYDROcodone-acetaminophen, HYDROmorphone (DILAUDID) injection, ondansetron **OR** ondansetron (ZOFRAN) IV  Assessment/ Plan:  47 y.o. male with end stage renal disease on hemodialysis, hypertension, hepatic alcoholic cirrhosis  [facility] [address], Tuesday, Thursday, Saturday  1.  End-stage renal disease 2.  Anemia of chronic kidney disease 3.  Secondary hyperparathyroidism 4.  Cirrhosis 5.  Diffuse abdominal pain  Plan We will plan on dialysis tomorrow to keep him on his normal schedule Hold binders until he is able to eat normal diet Patient appears volume depleted today.  We will give a 500 cc bolus Epogen with dialysis for Anemia    LOS: 0 Waleed Dettman Candiss Norse 1/18/20199:59 AM  [facility] [place], [place]

## 2017-08-21 NOTE — Progress Notes (Addendum)
Initial Nutrition Assessment  DOCUMENTATION CODES:   Non-severe (moderate) malnutrition in context of chronic illness  INTERVENTION:   Nepro Shake po BID, each supplement provides 425 kcal and 19 grams protein  Rena-vite daily  Vitamin C 573m po BID  Recommend thiamine 1033mdaily  Recommend folic acid 62m42maily   MVI twice weekly  Assist with ordering meals  NUTRITION DIAGNOSIS:   Moderate Malnutrition related to chronic illness(etoh abuse, cirrhosis, ESRD on HD) as evidenced by moderate fat depletion, moderate muscle depletion.  GOAL:   Patient will meet greater than or equal to 90% of their needs  MONITOR:   PO intake, Supplement acceptance, Labs, Weight trends, I & O's  REASON FOR ASSESSMENT:   Malnutrition Screening Tool    ASSESSMENT:   [age]o. male with end stage renal disease on hemodialysis, hypertension, hepatic alcoholic cirrhosis   Pt with AVF in left arm  Met with pt in room today; interpreter was used. RD familar with this pt from previous admits. Pt reports poor appetite and oral intake for the past two weeks but reports that he has not eaten anything in 4 days. Pt reports that his stomach is burning today and that he feels hungry but once he takes a bite of food he doesn't want it anymore. Pt does like Nepro supplements and he drinks these at dialysis but is unable to afford them at home. Per chart, pt has lost 32lbs(18%) in 10 months and 8lbs(5%) over the past month; this is significant given pt's history. Pt noted to have Scurvy diagnosis in November; pt's vitamin C levels were 0. Pt does not take any vitamins or vitamin C supplementation at home. RD suspects pt's labs would still be low today. Pt does get vitamin D injections during dialysis. RD will order supplements and vitamin C. RD will also change pt to a needs assist with meal ordering and have a spanish speaking host help pt to order meals. If pt's oral intake does not improve; recommend  liberalization of diet.      Medications reviewed and include: colace, heparin, protonix, carafate, ceftriaxone, hydromorphone, zofran  Labs reviewed: K 4.6 wnl, Cl 99(L), creat 5.69(H) Alk phos 226(H), lipase 85(H), ALT 15(L), tbili 1.4(H)- 06/20/17 P 5.9(H)- 06/13/17 Thiamine 87.9 wnl, vitamin D, 25- hroxy- 24.9(L), vitamin B12- 1291 (H)- 06/10/17 PTH- 1745(H)- 01/06/17  Nutrition-Focused physical exam completed. Findings are moderate fat depletions in arms and chest, moderate muscle depletions in clavicles, shoulders, and BLE, and no edema.   Diet Order:  Diet renal with fluid restriction Fluid restriction: 1200 mL Fluid; Room service appropriate? Yes with Assist; Fluid consistency: Thin  EDUCATION NEEDS:   Education needs have been addressed  Skin: Reviewed RN Assessment  Last BM:  1/17  Height:   Ht Readings from Last 1 Encounters:  08/20/17 5' 6.93" (1.7 m)    Weight:   Wt Readings from Last 1 Encounters:  08/21/17 146 lb 8 oz (66.5 kg)    Ideal Body Weight:  67.2 kg  BMI:  Body mass index is 22.99 kg/m.  Estimated Nutritional Needs:   Kcal:  1950-2250kcal/day   Protein:  105-119g/day   Fluid:  per MD  CasKoleen Distance, RD, LDN Pager #- [phone] Hours Pager: [phone]

## 2017-08-22 LAB — GLUCOSE, CAPILLARY
GLUCOSE-CAPILLARY: 62 mg/dL — AB (ref 65–99)
GLUCOSE-CAPILLARY: 90 mg/dL (ref 65–99)
Glucose-Capillary: 89 mg/dL (ref 65–99)

## 2017-08-22 LAB — BASIC METABOLIC PANEL
Anion gap: 14 (ref 5–15)
BUN: 24 mg/dL — ABNORMAL HIGH (ref 6–20)
CO2: 23 mmol/L (ref 22–32)
CREATININE: 7.05 mg/dL — AB (ref 0.61–1.24)
Calcium: 8.4 mg/dL — ABNORMAL LOW (ref 8.9–10.3)
Chloride: 98 mmol/L — ABNORMAL LOW (ref 101–111)
GFR calc non Af Amer: 8 mL/min — ABNORMAL LOW (ref >60)
GFR, EST AFRICAN AMERICAN: 10 mL/min — AB (ref >60)
Glucose, Bld: 75 mg/dL (ref 65–99)
Potassium: 4.9 mmol/L (ref 3.5–5.1)
SODIUM: 135 mmol/L (ref 135–145)

## 2017-08-22 MED ORDER — LACTULOSE 10 GM/15ML PO SOLN
30.0000 g | Freq: Two times a day (BID) | ORAL | Status: DC
  Filled 2017-08-22 (×3): qty 45

## 2017-08-22 MED ORDER — LACTULOSE 20 GM/30ML PO SOLN
30.0000 g | Freq: Two times a day (BID) | ORAL | Status: DC
  Administered 2017-08-22: 30 g via ORAL
  Filled 2017-08-22 (×2): qty 45
  Filled 2017-08-22: qty 60
  Filled 2017-08-22 (×2): qty 45
  Filled 2017-08-22: qty 60

## 2017-08-22 MED ORDER — SODIUM CHLORIDE 0.9% FLUSH
3.0000 mL | Freq: Two times a day (BID) | INTRAVENOUS | Status: DC
  Administered 2017-08-23: 3 mL via INTRAVENOUS

## 2017-08-22 MED ORDER — LACTULOSE 10 GM/15ML PO SOLN: 30.0000 g | Freq: Two times a day (BID) | ORAL | Status: DC

## 2017-08-22 MED ORDER — SODIUM CHLORIDE 0.9% FLUSH
3.0000 mL | Freq: Two times a day (BID) | INTRAVENOUS | Status: DC
  Administered 2017-08-22 (×2): 3 mL via INTRAVENOUS

## 2017-08-22 NOTE — Progress Notes (Addendum)
Hypoglycemic Event  CBG: 62  Treatment: 120 ml grape juice  Symptoms: None  Follow-up CBG: Time:0850 CBG Result:90  Possible Reasons for Event: Inadequate meal intake  Dr. Darvin Neighbours notified. Will continue to monitor.    Mario [name]

## 2017-08-22 NOTE — Progress Notes (Signed)
Pt off the floor for HD. Report given to dialysis nurse.

## 2017-08-22 NOTE — Progress Notes (Signed)
Pt back from HD, vss. Will continue to monitor.

## 2017-08-22 NOTE — Progress Notes (Signed)
[facility] [place] 08/22/17  Subjective:   Patient seen during dialysis Tolerating well   HEMODIALYSIS FLOWSHEET:  Blood Flow Rate (mL/min): 370 mL/min Arterial Pressure (mmHg): -170 mmHg Venous Pressure (mmHg): 160 mmHg Transmembrane Pressure (mmHg): 50 mmHg Ultrafiltration Rate (mL/min): 150 mL/min Dialysate Flow Rate (mL/min): 800 ml/min Conductivity: Machine : 14 Conductivity: Machine : 14 Dialysis Fluid Bolus: Normal Saline Bolus Amount (mL): 250 mL    Objective:  Vital signs in last 24 hours:  Temp:  [98.3 F (36.8 C)-98.7 F (37.1 C)] 98.7 F (37.1 C) (01/19 1020) Pulse Rate:  [64-70] 65 (01/19 1345) Resp:  [16-23] 23 (01/19 1100) BP: (131-182)/(51-91) 162/67 (01/19 1345) SpO2:  [99 %-100 %] 100 % (01/19 1330) Weight:  [68.2 kg (150 lb 4.8 oz)-69.8 kg (153 lb 14.1 oz)] 69.8 kg (153 lb 14.1 oz) (01/19 1020)  Weight change: -3.402 kg (-8 oz) Filed Weights   08/21/17 1227 08/22/17 0401 08/22/17 1020  Weight: 66.5 kg (146 lb 8 oz) 68.2 kg (150 lb 4.8 oz) 69.8 kg (153 lb 14.1 oz)    Intake/Output:    Intake/Output Summary (Last 24 hours) at 08/22/2017 1358 Last data filed at 08/22/2017 1035 Gross per 24 hour  Intake 240 ml  Output 0 ml  Net 240 ml     Physical Exam: General:  Chronically ill-appearing, laying in the bed  HEENT  moist oral mucous membranes  Neck  no distended neck veins  Pulm/lungs  normal breathing effort, clear to auscultation  CVS/Heart  regular rhythm, soft systolic murmur  Abdomen:   Soft, mild diffuse tenderness especially midepigastric area  Extremities:  No peripheral edema  Neurologic:  Alert, oriented  Skin:  Dry skin  Access:  Left upper extremity AV fistula       Basic Metabolic Panel:  Recent Labs  Lab 08/20/17 1929 08/21/17 0505 08/22/17 0513  NA 137 137 135  K 3.9 4.6 4.9  CL 99* 99* 98*  CO2 26 27 23   GLUCOSE 83 82 75  BUN 12 14 24*  CREATININE 4.79* 5.69* 7.05*  CALCIUM 8.9  9.2 8.4*     CBC: Recent Labs  Lab 08/20/17 1929 08/21/17 0505  WBC 5.1 4.8  HGB 12.8* 11.9*  HCT 38.0* 36.1*  MCV 97.9 99.1  PLT 122* 112*      Lab Results  Component Value Date   HEPBSAG Negative 01/06/2017      Microbiology:  Recent Results (from the past 240 hour(s))  Blood culture (routine x 2)     Status: None (Preliminary result)   Collection Time: 08/20/17  8:22 PM  Result Value Ref Range Status   Specimen Description BLOOD RFOA  Final   Special Requests   Final    BOTTLES DRAWN AEROBIC AND ANAEROBIC Blood Culture adequate volume   Culture   Final    NO GROWTH 2 DAYS Performed at [facility], [address] [place], [place] [address]    Report Status PENDING  Incomplete  Body fluid culture ( includes gram stain)     Status: None (Preliminary result)   Collection Time: 08/20/17  8:25 PM  Result Value Ref Range Status   Specimen Description   Final    PERITONEAL CAVITY Performed at [facility], [address] [place], [place] [address]    Special Requests   Final    NONE Performed at [facility], [address], [place], [place] [address]    Gram Stain   Final  FEW WBC PRESENT, PREDOMINANTLY MONONUCLEAR NO ORGANISMS SEEN    Culture   Final    NO GROWTH 1 DAY Performed at [facility], [place] [address] [place], [place] [address]    Report Status PENDING  Incomplete  Blood culture (routine x 2)     Status: None (Preliminary result)   Collection Time: 08/20/17  8:25 PM  Result Value Ref Range Status   Specimen Description BLOOD RIGHT WRIST  Final   Special Requests   Final    BOTTLES DRAWN AEROBIC AND ANAEROBIC Blood Culture adequate volume   Culture   Final    NO GROWTH 2 DAYS Performed at [facility], [address] [place], [place] [address]    Report Status PENDING  Incomplete  MRSA PCR Screening     Status: None   Collection Time: 08/21/17  2:20 AM  Result Value Ref Range Status    MRSA by PCR NEGATIVE NEGATIVE Final    Comment:        The GeneXpert MRSA Assay (FDA approved for NASAL specimens only), is one component of a comprehensive MRSA colonization surveillance program. It is not intended to diagnose MRSA infection nor to guide or monitor treatment for MRSA infections. Performed at [facility], [address] [place], [place] [address]     Coagulation Studies: No results for input(s): LABPROT, INR in the last 72 hours.  Urinalysis: No results for input(s): COLORURINE, LABSPEC, PHURINE, GLUCOSEU, HGBUR, BILIRUBINUR, KETONESUR, PROTEINUR, UROBILINOGEN, NITRITE, LEUKOCYTESUR in the last 72 hours.  Invalid input(s): APPERANCEUR    Imaging: Ct Abdomen Pelvis W Contrast  Result Date: 08/20/2017 CLINICAL DATA:  Abdominal pain and left inguinal hernia, fever EXAM: CT ABDOMEN AND PELVIS WITH CONTRAST TECHNIQUE: Multidetector CT imaging of the abdomen and pelvis was performed using the standard protocol following bolus administration of intravenous contrast. CONTRAST:  56mL ISOVUE-370 IOPAMIDOL (ISOVUE-370) INJECTION 76% COMPARISON:  06/04/2017, 02/25/2017, 10/13/2016, 01/04/2016 CT FINDINGS: Lower chest: No acute consolidation or pleural effusion. Mild cardiomegaly. Hepatobiliary: Cirrhotic liver, very coarse nodular appearance. No focal hepatic abnormality. No calcified gallstone or biliary dilatation Pancreas: Unremarkable. No pancreatic ductal dilatation or surrounding inflammatory changes. Spleen: Enlarged, measuring 16 cm on coronal views. Adrenals/Urinary Tract: Stable 13 mm right adrenal gland nodule. Left adrenal gland is normal. Atrophic kidneys. No hydronephrosis. Multiple cysts. 17 mm mildly dense lesion mid right kidney, increased density but decreased size. Bladder nearly empty Stomach/Bowel: Stomach nonenlarged. Prominent loop of small bowel in the left abdomen measuring up to 3.1 cm but no obstruction is seen. There is no colon wall thickening.  Negative appendix. Vascular/Lymphatic: Non aneurysmal aorta. Calcifications in the wall of the vena cava and iliac veins. Portal and splenic veins are patent. Dilated tortuous azygos system. Large distal esophageal varices. Varices in the upper abdomen. Prominent right perirectal vessels read demonstrated. Recanalized umbilical vein. No significantly enlarged lymph nodes. Reproductive: Prostate is unremarkable. Other: Negative for free air. Moderate to large volume of ascites in the abdomen and pelvis. Musculoskeletal: No acute or significant osseous findings. IMPRESSION: 1. Cirrhosis of the liver with portal hypertension as evidenced by splenomegaly and multiple upper abdominal varices and portosystemic shunts. 2. Moderate to large volume of ascites in the abdomen and pelvis. 3. Mildly enlarged loop of small bowel in the left upper quadrant but no definitive bowel obstruction. 4. Atrophic kidneys. No hydronephrosis. Multiple cysts in the kidneys. 5. Stable right adrenal gland nodule Electronically Signed   By: Donavan Foil M.D.   On:  08/20/2017 21:31   US Abdomen Limited  Result Date: 08/21/2017 CLINICAL DATA:  History of cirrhosis, now with symptomatic ascites, post paracentesis performed last evening in the emergency department. EXAM: LIMITED ABDOMEN ULTRASOUND FOR ASCITES TECHNIQUE: Limited ultrasound survey for ascites was performed in all four abdominal quadrants. COMPARISON:  None. FINDINGS: Sonographic evaluation of the abdomen demonstrates only a trace amount of residual intra-abdominal ascites within the right lower abdominal quadrant, too small to allow for safe ultrasound-guided paracentesis. IMPRESSION: Trace amount of intra-abdominal ascites following bedside paracentesis performed in the emergency room last evening. As such, no paracentesis was attempted. Electronically Signed   By: Sandi Mariscal M.D.   On: 08/21/2017 12:54     Medications:   . cefTRIAXone (ROCEPHIN)  IV Stopped (08/21/17  1335)   . amLODipine  10 mg Oral Daily  . docusate sodium  100 mg Oral BID  . feeding supplement (NEPRO CARB STEADY)  237 mL Oral BID BM  . ferric citrate  420 mg Oral TID WC  . heparin  5,000 Units Subcutaneous Q8H  . Lactulose  30 g Oral BID  . metoprolol tartrate  25 mg Oral BID  . multivitamin  1 tablet Oral QHS  . [START ON 08/24/2017] multivitamin with minerals  1 tablet Oral Once per day on Mon Thu  . pantoprazole  40 mg Oral Daily  . polyethylene glycol  17 g Oral BID  . sucralfate  1 g Oral Q6H  . thiamine  100 mg Oral Daily  . vitamin C  500 mg Oral BID   acetaminophen **OR** acetaminophen, bisacodyl, HYDROcodone-acetaminophen, HYDROmorphone (DILAUDID) injection, ondansetron **OR** ondansetron (ZOFRAN) IV  Assessment/ Plan:  47 y.o. male with end stage renal disease on hemodialysis, hypertension, hepatic alcoholic cirrhosis  [facility] st, Tuesday, Thursday, Saturday  1.  End-stage renal disease 2.  Anemia of chronic kidney disease 3.  Secondary hyperparathyroidism 4.  Cirrhosis 5.  Diffuse abdominal pain  Plan Seen during dialysis.  Tolerating Hold binders until he is able to eat normal diet Continue hemoglobin is above goal.  Hold Epogen    LOS: 1 Mario [name] Mario [name] 1/19/20191:58 PM  [place], [place]

## 2017-08-22 NOTE — Progress Notes (Signed)
Dialysis Pre-assessment

## 2017-08-22 NOTE — Progress Notes (Signed)
HD tx end  

## 2017-08-22 NOTE — Progress Notes (Signed)
Shift course, pain assessment and medications scheduled reviewed with patient via Video interpreter Mayra [phone].

## 2017-08-22 NOTE — Progress Notes (Signed)
[facility] [place] NAME: Mario [name]    MR#:  [id number]  DATE OF BIRTH:  [birth year]  SUBJECTIVE:  CHIEF COMPLAINT:   Chief Complaint  Patient presents with  . Abdominal Pain  . Hernia   - Came in with abdominal pain. Also has some left groin pain. -A Spanish interpreter was used to communicate  REVIEW OF SYSTEMS:  Review of Systems  Constitutional: Negative for chills, fever and malaise/fatigue.  HENT: Negative for congestion, hearing loss and nosebleeds.   Eyes: Negative for blurred vision and double vision.  Respiratory: Negative for cough, shortness of breath and wheezing.   Cardiovascular: Negative for chest pain and palpitations.  Gastrointestinal: Positive for abdominal pain. Negative for constipation, diarrhea, nausea and vomiting.  Genitourinary: Negative for dysuria and urgency.  Neurological: Negative for dizziness, speech change, focal weakness, seizures and headaches.  Psychiatric/Behavioral: Negative for depression.   DRUG ALLERGIES:   Allergies  Allergen Reactions  . Betadine [Povidone Iodine] Rash    VITALS:  Blood pressure (!) 156/72, pulse 64, temperature 98.7 F (37.1 C), temperature source Oral, resp. rate (!) 23, height 5' 6.93" (1.7 m), weight 69.8 kg (153 lb 14.1 oz), SpO2 100 %.  PHYSICAL EXAMINATION:  Physical Exam  GENERAL:  47 y.o.-year-old patient lying in the bed with no acute distress. Generalized body aches EYES: Pupils equal, round, reactive to light and accommodation. No scleral icterus. Extraocular muscles intact.  HEENT: Head atraumatic, normocephalic. Oropharynx and nasopharynx clear.  NECK:  Supple, no jugular venous distention. No thyroid enlargement, no tenderness.  LUNGS: Normal breath sounds bilaterally, no wheezing, rales,rhonchi or crepitation. No use of accessory muscles of respiration.  CARDIOVASCULAR: S1, S2 normal. No murmurs, rubs, or gallops.  ABDOMEN: Soft,  tenderness diffusely, nondistended. Bowel sounds present. No organomegaly or mass.  EXTREMITIES: No pedal edema, cyanosis, or clubbing. Left upper arm AV fistula with good thrill NEUROLOGIC: Cranial nerves II through XII are intact. Muscle strength 5/5 in all extremities. Sensation intact. Gait not checked. Generalized weakness. PSYCHIATRIC: The patient is alert and oriented x 3.  SKIN: No obvious rash, lesion, or ulcer.    LABORATORY PANEL:   CBC Recent Labs  Lab 08/21/17 0505  WBC 4.8  HGB 11.9*  HCT 36.1*  PLT 112*   ------------------------------------------------------------------------------------------------------------------  Chemistries  Recent Labs  Lab 08/20/17 1929  08/22/17 0513  NA 137   < > 135  K 3.9   < > 4.9  CL 99*   < > 98*  CO2 26   < > 23  GLUCOSE 83   < > 75  BUN 12   < > 24*  CREATININE 4.79*   < > 7.05*  CALCIUM 8.9   < > 8.4*  AST 27  --   --   ALT 15*  --   --   ALKPHOS 226*  --   --   BILITOT 1.4*  --   --    < > = values in this interval not displayed.   ------------------------------------------------------------------------------------------------------------------  Cardiac Enzymes Recent Labs  Lab 08/20/17 1929  TROPONINI <0.03   ------------------------------------------------------------------------------------------------------------------  RADIOLOGY:  Ct Abdomen Pelvis W Contrast  Result Date: 08/20/2017 CLINICAL DATA:  Abdominal pain and left inguinal hernia, fever EXAM: CT ABDOMEN AND PELVIS WITH CONTRAST TECHNIQUE: Multidetector CT imaging of the abdomen and pelvis was performed using the standard protocol following bolus administration of intravenous contrast. CONTRAST:  57mL ISOVUE-370 IOPAMIDOL (ISOVUE-370) INJECTION 76% COMPARISON:  06/04/2017, 02/25/2017, 10/13/2016, 01/04/2016 CT FINDINGS: Lower chest: No acute consolidation or pleural effusion. Mild cardiomegaly. Hepatobiliary: Cirrhotic liver, very coarse nodular  appearance. No focal hepatic abnormality. No calcified gallstone or biliary dilatation Pancreas: Unremarkable. No pancreatic ductal dilatation or surrounding inflammatory changes. Spleen: Enlarged, measuring 16 cm on coronal views. Adrenals/Urinary Tract: Stable 13 mm right adrenal gland nodule. Left adrenal gland is normal. Atrophic kidneys. No hydronephrosis. Multiple cysts. 17 mm mildly dense lesion mid right kidney, increased density but decreased size. Bladder nearly empty Stomach/Bowel: Stomach nonenlarged. Prominent loop of small bowel in the left abdomen measuring up to 3.1 cm but no obstruction is seen. There is no colon wall thickening. Negative appendix. Vascular/Lymphatic: Non aneurysmal aorta. Calcifications in the wall of the vena cava and iliac veins. Portal and splenic veins are patent. Dilated tortuous azygos system. Large distal esophageal varices. Varices in the upper abdomen. Prominent right perirectal vessels read demonstrated. Recanalized umbilical vein. No significantly enlarged lymph nodes. Reproductive: Prostate is unremarkable. Other: Negative for free air. Moderate to large volume of ascites in the abdomen and pelvis. Musculoskeletal: No acute or significant osseous findings. IMPRESSION: 1. Cirrhosis of the liver with portal hypertension as evidenced by splenomegaly and multiple upper abdominal varices and portosystemic shunts. 2. Moderate to large volume of ascites in the abdomen and pelvis. 3. Mildly enlarged loop of small bowel in the left upper quadrant but no definitive bowel obstruction. 4. Atrophic kidneys. No hydronephrosis. Multiple cysts in the kidneys. 5. Stable right adrenal gland nodule Electronically Signed   By: Donavan Foil M.D.   On: 08/20/2017 21:31   US Abdomen Limited  Result Date: 08/21/2017 CLINICAL DATA:  History of cirrhosis, now with symptomatic ascites, post paracentesis performed last evening in the emergency department. EXAM: LIMITED ABDOMEN ULTRASOUND FOR  ASCITES TECHNIQUE: Limited ultrasound survey for ascites was performed in all four abdominal quadrants. COMPARISON:  None. FINDINGS: Sonographic evaluation of the abdomen demonstrates only a trace amount of residual intra-abdominal ascites within the right lower abdominal quadrant, too small to allow for safe ultrasound-guided paracentesis. IMPRESSION: Trace amount of intra-abdominal ascites following bedside paracentesis performed in the emergency room last evening. As such, no paracentesis was attempted. Electronically Signed   By: Sandi Mariscal M.D.   On: 08/21/2017 12:54    EKG:   Orders placed or performed during the hospital encounter of 08/20/17  . ED EKG  . ED EKG  . EKG 12-Lead  . EKG 12-Lead    ASSESSMENT AND PLAN:   47 year old with liver cirrhosis and end-stage renal disease on hemodialysis admitted  with abdominal pain  1.Abdominal pain- from ascites 3 L through paracentesis.  Cultures pending.  WBC count elevated but less than 250 neutrophils.  Unlikely SBP. - cultures are pending - on IV Rocephin  - pain meds prn  2.End-stage renal disease- Tue,Thurs, Sat dialysis Nephrology following for hemodialysis - for dialysis today  3.Essential hypertension Continue amlodipine and metoprolol  4. Alcoholic liver cirrhosis-patient states he has quit drinking almost a year ago now. -No encephalopathy noted.  5. DVT prophylaxis-subcutaneous heparin  6.  A concern regarding left femoral hernia per patient.  Nothing on CT scan.  Unable to palpate.  Likely discharge tomorrow  All the records are reviewed and case discussed with Care Management/Social Worker Management plans discussed with the patient, family and they are in agreement.  CODE STATUS: Full Code  TOTAL TIME TAKING CARE OF THIS PATIENT: 35 minutes.   POSSIBLE D/C IN 1-2 DAYS, DEPENDING ON CLINICAL  CONDITION.  Neita Carp M.D on 08/22/2017 at 11:36 AM  Between 7am to 6pm - Pager -  [phone]  After 6pm go to [email or web address] - Proofreader  [facility]  Office  [phone]  CC: Primary care physician; [name], MD

## 2017-08-22 NOTE — Progress Notes (Signed)
Post HD assessment  

## 2017-08-22 NOTE — Progress Notes (Signed)
Dialysis Treatment started 

## 2017-08-22 NOTE — Plan of Care (Signed)
  Progressing Education: Knowledge of General Education information will improve 08/22/2017 1606 - Progressing by Rolley Sims, RN Health Behavior/Discharge Planning: Ability to manage health-related needs will improve 08/22/2017 1606 - Progressing by Rolley Sims, RN Activity: Risk for activity intolerance will decrease 08/22/2017 1606 - Progressing by Rolley Sims, RN Coping: Level of anxiety will decrease 08/22/2017 1606 - Progressing by Rolley Sims, RN

## 2017-08-22 NOTE — Progress Notes (Signed)
Pre-Dialysis Assessment. 

## 2017-08-23 LAB — GLUCOSE, CAPILLARY: Glucose-Capillary: 62 mg/dL — ABNORMAL LOW (ref 65–99)

## 2017-08-23 MED ORDER — PANTOPRAZOLE SODIUM 40 MG PO TBEC: 40.0000 mg | DELAYED_RELEASE_TABLET | Freq: Every day | ORAL | 0 refills | Status: DC

## 2017-08-23 MED ORDER — AMLODIPINE BESYLATE 10 MG PO TABS: 10.0000 mg | ORAL_TABLET | Freq: Every day | ORAL | 0 refills | Status: DC

## 2017-08-23 MED ORDER — CHOLESTYRAMINE 4 G PO PACK: 4.0000 g | PACK | Freq: Three times a day (TID) | ORAL | 12 refills | Status: DC

## 2017-08-23 MED ORDER — TRAMADOL HCL 50 MG PO TABS: 50.0000 mg | ORAL_TABLET | Freq: Four times a day (QID) | ORAL | 0 refills | Status: DC | PRN

## 2017-08-23 MED ORDER — CALCIUM ACETATE (PHOS BINDER) 667 MG PO TABS: 2001.0000 mg | ORAL_TABLET | Freq: Three times a day (TID) | ORAL | 0 refills | Status: DC

## 2017-08-23 MED ORDER — METOPROLOL TARTRATE 25 MG PO TABS: 25.0000 mg | ORAL_TABLET | Freq: Two times a day (BID) | ORAL | 0 refills | Status: DC

## 2017-08-23 MED ORDER — CIPROFLOXACIN HCL 500 MG PO TABS: 500.0000 mg | ORAL_TABLET | Freq: Every day | ORAL | 0 refills | Status: DC

## 2017-08-23 NOTE — Plan of Care (Signed)
Abdominal pain relieved with prn meds.

## 2017-08-23 NOTE — Discharge Instructions (Signed)
Renal diet ° °Activity as tolerated °

## 2017-08-23 NOTE — Progress Notes (Signed)
Pt to be discharged today. Iv removed. disch instructions given to pt by interpreter. Taxi voucher provided. Awaiting transport.

## 2017-08-23 NOTE — Progress Notes (Signed)
[facility] [place], [place] 08/23/17  Subjective:   Patient feels well today Abdominal pain has significantly improved Tolerated dialysis well yesterday No fluid was removed  Objective:  Vital signs in last 24 hours:  Temp:  [97.7 F (36.5 C)-99.1 F (37.3 C)] 98 F (36.7 C) (01/20 0813) Pulse Rate:  [64-73] 70 (01/20 0813) Resp:  [15-18] 16 (01/19 1949) BP: (149-188)/(50-78) 164/61 (01/20 0813) SpO2:  [98 %-100 %] 100 % (01/20 0813) Weight:  [67.4 kg (148 lb 9.6 oz)-69.8 kg (153 lb 14.1 oz)] 67.4 kg (148 lb 9.6 oz) (01/20 0308)  Weight change: 3.348 kg (7 lb 6.1 oz) Filed Weights   08/22/17 1020 08/22/17 1431 08/23/17 0308  Weight: 69.8 kg (153 lb 14.1 oz) 69.8 kg (153 lb 14.1 oz) 67.4 kg (148 lb 9.6 oz)    Intake/Output:    Intake/Output Summary (Last 24 hours) at 08/23/2017 1139 Last data filed at 08/23/2017 1005 Gross per 24 hour  Intake 580 ml  Output 50 ml  Net 530 ml     Physical Exam: General:  Chronically ill-appearing, laying in the bed  HEENT  moist oral mucous membranes  Neck  supple  Pulm/lungs  normal breathing effort, clear to auscultation  CVS/Heart  regular rhythm, soft systolic murmur  Abdomen:   Soft, nontender  Extremities:  No peripheral edema  Neurologic:  Alert, oriented  Skin:  Dry skin  Access:  Left upper extremity AV fistula       Basic Metabolic Panel:  Recent Labs  Lab 08/20/17 1929 08/21/17 0505 08/22/17 0513  NA 137 137 135  K 3.9 4.6 4.9  CL 99* 99* 98*  CO2 26 27 23   GLUCOSE 83 82 75  BUN 12 14 24*  CREATININE 4.79* 5.69* 7.05*  CALCIUM 8.9 9.2 8.4*     CBC: Recent Labs  Lab 08/20/17 1929 08/21/17 0505  WBC 5.1 4.8  HGB 12.8* 11.9*  HCT 38.0* 36.1*  MCV 97.9 99.1  PLT 122* 112*      Lab Results  Component Value Date   HEPBSAG Negative 01/06/2017      Microbiology:  Recent Results (from the past 240 hour(s))  Blood culture (routine x 2)     Status: None (Preliminary  result)   Collection Time: 08/20/17  8:22 PM  Result Value Ref Range Status   Specimen Description BLOOD RFOA  Final   Special Requests   Final    BOTTLES DRAWN AEROBIC AND ANAEROBIC Blood Culture adequate volume   Culture   Final    NO GROWTH 3 DAYS Performed at [facility], [address] [place], [place] [address]    Report Status PENDING  Incomplete  Body fluid culture ( includes gram stain)     Status: None (Preliminary result)   Collection Time: 08/20/17  8:25 PM  Result Value Ref Range Status   Specimen Description   Final    PERITONEAL CAVITY Performed at [facility], [address], [place], [place] [address]    Special Requests   Final    NONE Performed at [facility], [address] [place], [place] [address]    Gram Stain   Final    FEW WBC PRESENT, PREDOMINANTLY MONONUCLEAR NO ORGANISMS SEEN    Culture   Final    NO GROWTH 2 DAYS Performed at [facility], [place] [address] [place], [place] [address]    Report Status PENDING  Incomplete  Blood culture (routine x 2)  Status: None (Preliminary result)   Collection Time: 08/20/17  8:25 PM  Result Value Ref Range Status   Specimen Description BLOOD RIGHT WRIST  Final   Special Requests   Final    BOTTLES DRAWN AEROBIC AND ANAEROBIC Blood Culture adequate volume   Culture   Final    NO GROWTH 3 DAYS Performed at [facility], [address] [place], [place] [address]    Report Status PENDING  Incomplete  MRSA PCR Screening     Status: None   Collection Time: 08/21/17  2:20 AM  Result Value Ref Range Status   MRSA by PCR NEGATIVE NEGATIVE Final    Comment:        The GeneXpert MRSA Assay (FDA approved for NASAL specimens only), is one component of a comprehensive MRSA colonization surveillance program. It is not intended to diagnose MRSA infection nor to guide or monitor treatment for MRSA infections. Performed at [facility], [address] [place], [place] [address]     Coagulation Studies: No results for input(s): LABPROT, INR in the last 72 hours.  Urinalysis: No results for input(s): COLORURINE, LABSPEC, PHURINE, GLUCOSEU, HGBUR, BILIRUBINUR, KETONESUR, PROTEINUR, UROBILINOGEN, NITRITE, LEUKOCYTESUR in the last 72 hours.  Invalid input(s): APPERANCEUR    Imaging: No results found.   Medications:   . cefTRIAXone (ROCEPHIN)  IV 2 g (08/23/17 1022)   . amLODipine  10 mg Oral Daily  . docusate sodium  100 mg Oral BID  . feeding supplement (NEPRO CARB STEADY)  237 mL Oral BID BM  . ferric citrate  420 mg Oral TID WC  . heparin  5,000 Units Subcutaneous Q8H  . Lactulose  30 g Oral BID  . metoprolol tartrate  25 mg Oral BID  . multivitamin  1 tablet Oral QHS  . [START ON 08/24/2017] multivitamin with minerals  1 tablet Oral Once per day on Mon Thu  . pantoprazole  40 mg Oral Daily  . polyethylene glycol  17 g Oral BID  . sodium chloride flush  3 mL Intravenous Q12H  . sodium chloride flush  3 mL Intravenous Q12H  . thiamine  100 mg Oral Daily  . vitamin C  500 mg Oral BID   acetaminophen **OR** acetaminophen, bisacodyl, HYDROcodone-acetaminophen, HYDROmorphone (DILAUDID) injection, ondansetron **OR** ondansetron (ZOFRAN) IV  Assessment/ Plan:  47 y.o. male with end stage renal disease on hemodialysis, hypertension, hepatic alcoholic cirrhosis  [facility] [address], Tuesday, Thursday, Saturday  1.  End-stage renal disease 2.  Anemia of chronic kidney disease 3.  Secondary hyperparathyroidism 4.  Cirrhosis 5.  Diffuse abdominal pain  Plan Patient is scheduled to be discharged today.  He will follow-up with his outpatient dialysis center on Tuesday as previously scheduled.  May resume home dose of binders.     LOS: [place] 1/20/201911:39 AM  [facility], [place]

## 2017-08-24 LAB — BODY FLUID CULTURE: Culture: NO GROWTH

## 2017-08-25 LAB — CULTURE, BLOOD (ROUTINE X 2)
CULTURE: NO GROWTH
Culture: NO GROWTH
SPECIAL REQUESTS: ADEQUATE
Special Requests: ADEQUATE

## 2017-08-26 NOTE — Discharge Summary (Signed)
[place] [facility] [place] NAME: Mario [name]    MR#:  [id number]  DATE OF BIRTH:  06/19/1971  DATE OF ADMISSION:  08/20/2017 ADMITTING PHYSICIAN: Amelia Jo, MD  DATE OF DISCHARGE: 08/23/2017  1:21 PM  PRIMARY CARE PHYSICIAN: [name], MD   ADMISSION DIAGNOSIS:  Spontaneous bacterial peritonitis ([place]) [K65.2] Elevated lipase [R74.8]  DISCHARGE DIAGNOSIS:  Active Problems:   Abdominal pain, acute   Malnutrition of moderate degree   SECONDARY DIAGNOSIS:   Past Medical History:  Diagnosis Date  . Abdominal pain 08/28/2016  . Acute hyperkalemia 10/28/2016  . Chest pain 04/11/2016  . Chronic combined systolic and diastolic CHF (congestive heart failure) ([place])   . Cirrhosis ([place])   . Dialysis patient ([place])   . ESRD (end stage renal disease) on dialysis ([place]) 01/25/2015  . Fluid overload 04/12/2016  . HTN (hypertension) 10/13/2016  . Hyperkalemia 11/27/2016  . Hypertension   . Pulmonary edema 07/03/2016  . Renal disorder   . Renal insufficiency   . Scrotal infection 01/25/2015     ADMITTING HISTORY  HISTORY OF PRESENT ILLNESS: Mario [name]  is a 47 y.o. male with a known history of end-stage renal disease on hemodialysis and advanced liver cirrhosis. Patient was brought to emergency room for severe diffuse abdominal pain, associated with nausea and vomiting, going on for the past 3-4 days.  She denies having fever or chills at home.  No diarrhea, no bleeding.  He underwent paracentesis in the emergency room.  Abdominal CAT scan is significant for advanced liver cirrhosis findings.  The patient is admitted to rule out SBP.    HOSPITAL COURSE:   *Abdominal pain secondary to ascites/SBP *End-stage renal disease *Essential hypertension *Alcoholic liver cirrhosis causing recurrent ascites  Patient admitted to medical floor.  Had a paracentesis.  Started on IV antibiotics.  Fluid cultures remain negative.  But due to  elevated WBC likely partially treated SBP patient will be treated as spontaneous bacterial peritonitis.  Seen by GI.  Patient felt better through the hospital stay after the fluid was drained with paracentesis.  Afebrile.  Continued on his dialysis schedule through nephrology.  Patient has tolerated his stay well and now is requesting to be discharged home being afebrile and normal WBC and improved abdominal pain.    CONSULTS OBTAINED:  Treatment Team:  Murlean Iba, MD  DRUG ALLERGIES:   Allergies  Allergen Reactions  . Betadine [Povidone Iodine] Rash    DISCHARGE MEDICATIONS:   Allergies as of 08/23/2017      Reactions   Betadine [povidone Iodine] Rash      Medication List    STOP taking these medications   cefTAZidime 2 g in dextrose 5 % 50 mL   feeding supplement (NEPRO CARB STEADY) Liqd   hydrOXYzine 25 MG tablet Commonly known as:  ATARAX/VISTARIL   multivitamin Liqd   ondansetron 4 MG disintegrating tablet Commonly known as:  ZOFRAN ODT     TAKE these medications   amLODipine 10 MG tablet Commonly known as:  NORVASC Take 1 tablet (10 mg total) by mouth daily.   AURYXIA 1 GM 210 MG(Fe) tablet Generic drug:  ferric citrate Take 420 mg by mouth 3 (three) times daily with meals.   calcium acetate 667 MG tablet Commonly known as:  PHOSLO Take 3 tablets (2,001 mg total) by mouth 3 (three) times daily with meals.   cholestyramine 4 g packet Commonly known as:  QUESTRAN Take 1 packet (4  g total) by mouth 3 (three) times daily with meals.   ciprofloxacin 500 MG tablet Commonly known as:  CIPRO Take 1 tablet (500 mg total) by mouth at bedtime.   metoprolol tartrate 25 MG tablet Commonly known as:  LOPRESSOR Take 1 tablet (25 mg total) by mouth 2 (two) times daily.   pantoprazole 40 MG tablet Commonly known as:  PROTONIX Take 1 tablet (40 mg total) by mouth daily.   traMADol 50 MG tablet Commonly known as:  ULTRAM Take 1 tablet (50 mg total) by mouth  every 6 (six) hours as needed for severe pain.       Today   VITAL SIGNS:  Blood pressure (!) 164/61, pulse 70, temperature 98 F (36.7 C), resp. rate 16, height 5' 6.93" (1.7 m), weight 67.4 kg (148 lb 9.6 oz), SpO2 100 %.  I/O:  No intake or output data in the 24 hours ending 08/26/17 1545  PHYSICAL EXAMINATION:  Physical Exam  GENERAL:  47 y.o.-year-old patient lying in the bed with no acute distress.  LUNGS: Normal breath sounds bilaterally, no wheezing, rales,rhonchi or crepitation. No use of accessory muscles of respiration.  CARDIOVASCULAR: S1, S2 normal. No murmurs, rubs, or gallops.  ABDOMEN: Soft, non-tender, non-distended. Bowel sounds present. No organomegaly or mass.  NEUROLOGIC: Moves all 4 extremities. PSYCHIATRIC: The patient is alert and oriented x 3.  SKIN: No obvious rash, lesion, or ulcer.   DATA REVIEW:   CBC Recent Labs  Lab 08/21/17 0505  WBC 4.8  HGB 11.9*  HCT 36.1*  PLT 112*    Chemistries  Recent Labs  Lab 08/20/17 1929  08/22/17 0513  NA 137   < > 135  K 3.9   < > 4.9  CL 99*   < > 98*  CO2 26   < > 23  GLUCOSE 83   < > 75  BUN 12   < > 24*  CREATININE 4.79*   < > 7.05*  CALCIUM 8.9   < > 8.4*  AST 27  --   --   ALT 15*  --   --   ALKPHOS 226*  --   --   BILITOT 1.4*  --   --    < > = values in this interval not displayed.    Cardiac Enzymes Recent Labs  Lab 08/20/17 [place] <0.03    Microbiology Results  Results for orders placed or performed during the hospital encounter of 08/20/17  Blood culture (routine x 2)     Status: None   Collection Time: 08/20/17  8:22 PM  Result Value Ref Range Status   Specimen Description BLOOD RFOA  Final   Special Requests   Final    BOTTLES DRAWN AEROBIC AND ANAEROBIC Blood Culture adequate volume   Culture   Final    NO GROWTH 5 DAYS Performed at [facility], [address] [place], [place] [address]    Report Status 08/25/2017 FINAL  Final  Body fluid  culture ( includes gram stain)     Status: None   Collection Time: 08/20/17  8:25 PM  Result Value Ref Range Status   Specimen Description   Final    PERITONEAL CAVITY Performed at [facility], [address], [place], [place] [address]    Special Requests   Final    NONE Performed at [facility], [address], [place], [place] [address]    Gram Stain   Final  FEW WBC PRESENT, PREDOMINANTLY MONONUCLEAR NO ORGANISMS SEEN    Culture   Final    NO GROWTH 3 DAYS Performed at [facility], [place] [address] [place], [place] [address]    Report Status 08/24/2017 FINAL  Final  Blood culture (routine x 2)     Status: None   Collection Time: 08/20/17  8:25 PM  Result Value Ref Range Status   Specimen Description BLOOD RIGHT WRIST  Final   Special Requests   Final    BOTTLES DRAWN AEROBIC AND ANAEROBIC Blood Culture adequate volume   Culture   Final    NO GROWTH 5 DAYS Performed at [facility], [address] [place], [place] [address]    Report Status 08/25/2017 FINAL  Final  MRSA PCR Screening     Status: None   Collection Time: 08/21/17  2:20 AM  Result Value Ref Range Status   MRSA by PCR NEGATIVE NEGATIVE Final    Comment:        The GeneXpert MRSA Assay (FDA approved for NASAL specimens only), is one component of a comprehensive MRSA colonization surveillance program. It is not intended to diagnose MRSA infection nor to guide or monitor treatment for MRSA infections. Performed at [facility], [address], [place], [place] [address]     RADIOLOGY:  No results found.  Follow up with PCP in 1 week.  Management plans discussed with the patient, family and they are in agreement.  CODE STATUS:  Code Status History    Date Active Date Inactive Code Status Order ID Comments User Context   08/21/2017 02:08 08/23/2017 16:21 Full Code [id number]  Amelia Jo, MD ED   06/09/2017 20:48 06/13/2017 19:07 Full Code  [id number]  Dustin Flock, MD Inpatient   01/06/2017 14:37 01/07/2017 17:44 Full Code [id number]  Epifanio Lesches, MD ED   11/27/2016 14:31 11/30/2016 14:23 Full Code [id number]  Henreitta Leber, MD Inpatient   10/28/2016 16:11 10/31/2016 16:38 Full Code [id number]  Epifanio Lesches, MD ED   10/13/2016 21:36 10/14/2016 21:23 Full Code [id number]  Lance Coon, MD Inpatient   08/28/2016 16:58 08/30/2016 17:19 Full Code [id number]  Fritzi Mandes, MD ED   07/03/2016 20:06 07/06/2016 16:22 Full Code [id number]  Fritzi Mandes, MD ED   04/11/2016 18:57 04/12/2016 07:16 Full Code [id number]  Vaughan Basta, MD Inpatient   01/25/2015 12:29 01/28/2015 16:26 Full Code [id number]  Dustin Flock, MD Inpatient      TOTAL TIME TAKING CARE OF THIS PATIENT ON DAY OF DISCHARGE: more than 30 minutes.   Leia Alf Shun Pletz M.D on 08/26/2017 at 3:45 PM  Between 7am to 6pm - Pager - [phone]  After 6pm go to [email or web address] - password [email or web address] [facility]  Office  [phone]  CC: Primary care physician; [name], MD  Note: This dictation was prepared with Dragon dictation along with smaller phrase technology. Any transcriptional errors that result from this process are unintentional.

## 2017-10-01 ENCOUNTER — Emergency Department: Payer: Self-pay

## 2017-10-01 ENCOUNTER — Encounter: Payer: Self-pay | Admitting: Emergency Medicine

## 2017-10-01 ENCOUNTER — Observation Stay
Admission: EM | Admit: 2017-10-01 | Discharge: 2017-10-02 | Disposition: A | Discharge status: Home / Self Care | Payer: Self-pay | Attending: Internal Medicine | Admitting: Internal Medicine

## 2017-10-01 DIAGNOSIS — K7030 Alcoholic cirrhosis of liver without ascites: Secondary | ICD-10-CM | POA: Insufficient documentation

## 2017-10-01 DIAGNOSIS — R1110 Vomiting, unspecified: Secondary | ICD-10-CM

## 2017-10-01 DIAGNOSIS — Z992 Dependence on renal dialysis: Secondary | ICD-10-CM | POA: Insufficient documentation

## 2017-10-01 DIAGNOSIS — D631 Anemia in chronic kidney disease: Secondary | ICD-10-CM | POA: Insufficient documentation

## 2017-10-01 DIAGNOSIS — Z9114 Patient's other noncompliance with medication regimen: Secondary | ICD-10-CM | POA: Insufficient documentation

## 2017-10-01 DIAGNOSIS — F1021 Alcohol dependence, in remission: Secondary | ICD-10-CM | POA: Insufficient documentation

## 2017-10-01 DIAGNOSIS — Z79899 Other long term (current) drug therapy: Secondary | ICD-10-CM | POA: Insufficient documentation

## 2017-10-01 DIAGNOSIS — R197 Diarrhea, unspecified: Secondary | ICD-10-CM

## 2017-10-01 DIAGNOSIS — N186 End stage renal disease: Secondary | ICD-10-CM | POA: Insufficient documentation

## 2017-10-01 DIAGNOSIS — R519 Headache, unspecified: Secondary | ICD-10-CM

## 2017-10-01 DIAGNOSIS — Z79891 Long term (current) use of opiate analgesic: Secondary | ICD-10-CM | POA: Insufficient documentation

## 2017-10-01 DIAGNOSIS — R51 Headache: Secondary | ICD-10-CM | POA: Insufficient documentation

## 2017-10-01 DIAGNOSIS — K529 Noninfective gastroenteritis and colitis, unspecified: Principal | ICD-10-CM | POA: Insufficient documentation

## 2017-10-01 DIAGNOSIS — N2581 Secondary hyperparathyroidism of renal origin: Secondary | ICD-10-CM | POA: Insufficient documentation

## 2017-10-01 DIAGNOSIS — I120 Hypertensive chronic kidney disease with stage 5 chronic kidney disease or end stage renal disease: Secondary | ICD-10-CM | POA: Insufficient documentation

## 2017-10-01 DIAGNOSIS — F17210 Nicotine dependence, cigarettes, uncomplicated: Secondary | ICD-10-CM | POA: Insufficient documentation

## 2017-10-01 DIAGNOSIS — R112 Nausea with vomiting, unspecified: Secondary | ICD-10-CM

## 2017-10-01 LAB — CBC
HEMATOCRIT: 33.1 % — AB (ref 40.0–52.0)
Hemoglobin: 11.2 g/dL — ABNORMAL LOW (ref 13.0–18.0)
MCH: 32.5 pg (ref 26.0–34.0)
MCHC: 33.8 g/dL (ref 32.0–36.0)
MCV: 96.2 fL (ref 80.0–100.0)
PLATELETS: 128 10*3/uL — AB (ref 150–440)
RBC: 3.44 MIL/uL — AB (ref 4.40–5.90)
RDW: 14.2 % (ref 11.5–14.5)
WBC: 4.8 10*3/uL (ref 3.8–10.6)

## 2017-10-01 LAB — COMPREHENSIVE METABOLIC PANEL
ALT: 17 U/L (ref 17–63)
ANION GAP: 12 (ref 5–15)
AST: 32 U/L (ref 15–41)
Albumin: 3.5 g/dL (ref 3.5–5.0)
Alkaline Phosphatase: 213 U/L — ABNORMAL HIGH (ref 38–126)
BUN: 29 mg/dL — ABNORMAL HIGH (ref 6–20)
CHLORIDE: 96 mmol/L — AB (ref 101–111)
CO2: 28 mmol/L (ref 22–32)
Calcium: 8.9 mg/dL (ref 8.9–10.3)
Creatinine, Ser: 6.6 mg/dL — ABNORMAL HIGH (ref 0.61–1.24)
GFR, EST AFRICAN AMERICAN: 10 mL/min — AB (ref >60)
GFR, EST NON AFRICAN AMERICAN: 9 mL/min — AB (ref >60)
Glucose, Bld: 92 mg/dL (ref 65–99)
POTASSIUM: 4.8 mmol/L (ref 3.5–5.1)
SODIUM: 136 mmol/L (ref 135–145)
Total Bilirubin: 1.2 mg/dL (ref 0.3–1.2)
Total Protein: 7.4 g/dL (ref 6.5–8.1)

## 2017-10-01 LAB — LIPASE, BLOOD: LIPASE: 46 U/L (ref 11–51)

## 2017-10-01 LAB — INFLUENZA PANEL BY PCR (TYPE A & B)
Influenza A By PCR: NEGATIVE
Influenza B By PCR: NEGATIVE

## 2017-10-01 MED ORDER — PROCHLORPERAZINE EDISYLATE 5 MG/ML IJ SOLN
10.0000 mg | Freq: Once | INTRAMUSCULAR | Status: AC
  Administered 2017-10-01: 10 mg via INTRAVENOUS
  Filled 2017-10-01: qty 2

## 2017-10-01 MED ORDER — HALOPERIDOL LACTATE 5 MG/ML IJ SOLN
5.0000 mg | Freq: Once | INTRAMUSCULAR | Status: AC
Start: 2017-10-01 — End: 2017-10-01
  Administered 2017-10-01: 5 mg via INTRAVENOUS
  Filled 2017-10-01: qty 1

## 2017-10-01 MED ORDER — TRAMADOL HCL 50 MG PO TABS
50.0000 mg | ORAL_TABLET | Freq: Once | ORAL | Status: AC
  Administered 2017-10-01: 50 mg via ORAL
  Filled 2017-10-01: qty 1

## 2017-10-01 MED ORDER — ONDANSETRON 4 MG PO TBDP
4.0000 mg | ORAL_TABLET | Freq: Once | ORAL | Status: AC | PRN
  Administered 2017-10-01: 4 mg via ORAL
  Filled 2017-10-01: qty 1

## 2017-10-01 MED ORDER — PROMETHAZINE HCL 25 MG/ML IJ SOLN
12.5000 mg | Freq: Four times a day (QID) | INTRAMUSCULAR | Status: DC | PRN
  Filled 2017-10-01: qty 1

## 2017-10-01 MED ORDER — PROMETHAZINE HCL 25 MG/ML IJ SOLN
12.5000 mg | Freq: Four times a day (QID) | INTRAMUSCULAR | Status: DC | PRN
  Administered 2017-10-01: 12.5 mg via INTRAMUSCULAR
  Filled 2017-10-01: qty 1

## 2017-10-01 MED ORDER — DIPHENHYDRAMINE HCL 50 MG/ML IJ SOLN
12.5000 mg | Freq: Once | INTRAMUSCULAR | Status: AC
  Administered 2017-10-01: via INTRAVENOUS
  Filled 2017-10-01: qty 1

## 2017-10-01 NOTE — ED Notes (Signed)
Patient transported to CT 

## 2017-10-01 NOTE — ED Provider Notes (Signed)
[facility] Emergency Department Provider Note    None    (approximate)  I have reviewed the triage vital signs and the nursing notes.   HISTORY Chief Complaint Emesis    HPI Mario [name] is a 47 y.o. male with extensive chronic medical conditions on recent admissions to hospital for litany of issues including pancreatitis, ruling out SBP worsening liver cirrhosis heart disease presents with 3 days of nausea vomiting diarrhea headache.  Patient states he has been compliant with dialysis this week.  States he not tried anything at home to help with headache nausea or vomiting.  Denies any fevers.  No blood in his vomit.  Past Medical History:  Diagnosis Date  . Abdominal pain 08/28/2016  . Acute hyperkalemia 10/28/2016  . Chest pain 04/11/2016  . Chronic combined systolic and diastolic CHF (congestive heart failure) ([place])   . Cirrhosis ([place])   . Dialysis patient ([place])   . ESRD (end stage renal disease) on dialysis ([place]) 01/25/2015  . Fluid overload 04/12/2016  . HTN (hypertension) 10/13/2016  . Hyperkalemia 11/27/2016  . Hypertension   . Pulmonary edema 07/03/2016  . Renal disorder   . Renal insufficiency   . Scrotal infection 01/25/2015   Family History  Problem Relation Age of Onset  . Kidney failure Father   . Diabetes Mother    Past Surgical History:  Procedure Laterality Date  . AV FISTULA PLACEMENT     Patient Active Problem List   Diagnosis Date Noted  . Abdominal pain, acute 08/21/2017  . Malnutrition of moderate degree 08/21/2017  . SBP (spontaneous bacterial peritonitis) ([place]) 06/09/2017  . Erroneous encounter - disregard 03/04/2017  . Hyperkalemia 11/27/2016  . Acute hyperkalemia 10/28/2016  . Chronic combined systolic and diastolic CHF (congestive heart failure) ([place]) 10/13/2016  . Cirrhosis ([place]) 10/13/2016  . HTN (hypertension) 10/13/2016  . Abdominal pain 08/28/2016  . Pulmonary edema 07/03/2016  . Fluid overload 04/12/2016   . Chest pain 04/11/2016  . Scrotal infection 01/25/2015  . ESRD (end stage renal disease) on dialysis ([place]) 01/25/2015      Prior to Admission medications   Medication Sig Start Date End Date Taking? Authorizing Provider  amLODipine (NORVASC) 10 MG tablet Take 1 tablet (10 mg total) by mouth daily. 08/23/17  Yes Sudini, Alveta Heimlich, MD  calcium acetate (PHOSLO) 667 MG tablet Take 3 tablets (2,001 mg total) by mouth 3 (three) times daily with meals. 08/23/17  Yes Sudini, Alveta Heimlich, MD  cholestyramine (QUESTRAN) 4 g packet Take 1 packet (4 g total) by mouth 3 (three) times daily with meals. 08/23/17  Yes Sudini, Alveta Heimlich, MD  ferric citrate (AURYXIA) 1 GM 210 MG(Fe) tablet Take 420 mg by mouth 3 (three) times daily with meals.   Yes [provider]  metoprolol tartrate (LOPRESSOR) 25 MG tablet Take 1 tablet (25 mg total) by mouth 2 (two) times daily. 08/23/17  Yes Sudini, Alveta Heimlich, MD  pantoprazole (PROTONIX) 40 MG tablet Take 1 tablet (40 mg total) by mouth daily. 08/23/17  Yes Sudini, Alveta Heimlich, MD  traMADol (ULTRAM) 50 MG tablet Take 1 tablet (50 mg total) by mouth every 6 (six) hours as needed for severe pain. 08/23/17 08/23/18 Yes Sudini, Alveta Heimlich, MD  ciprofloxacin (CIPRO) 500 MG tablet Take 1 tablet (500 mg total) by mouth at bedtime. Patient not taking: Reported on 10/01/2017 08/23/17   Hillary Bow, MD    Allergies Betadine [povidone iodine]    Social History Social History   Tobacco Use  . Smoking  status: Current Every Day Smoker    Packs/day: 0.25    Years: 25.00    Pack years: 6.25    Types: Cigarettes  . Smokeless tobacco: Never Used  Substance Use Topics  . Alcohol use: No    Comment: 1/2 bottle tequilla per day  . Drug use: No    Review of Systems Patient denies headaches, rhinorrhea, blurry vision, numbness, shortness of breath, chest pain, edema, cough, abdominal pain, nausea, vomiting, diarrhea, dysuria, fevers, rashes or hallucinations unless otherwise stated above in  HPI. ____________________________________________   PHYSICAL EXAM:  VITAL SIGNS: Vitals:   10/01/17 1841 10/01/17 2300  BP: (!) 156/78 (!) 158/75  Pulse: 85 84  Resp: (!) 26   Temp: 98.9 F (37.2 C)   SpO2: 97% 97%    Constitutional: Alert and oriented.  in no acute distress. Eyes: Conjunctivae are normal.  Head: Atraumatic. Nose: No congestion/rhinnorhea. Mouth/Throat: Mucous membranes are moist.   Neck: No stridor. Painless ROM.  Cardiovascular: Normal rate, regular rhythm. Grossly normal heart sounds.  Good peripheral circulation. Respiratory: Normal respiratory effort.  No retractions. Lungs CTAB. Gastrointestinal: Soft and nontender. + fluid wave. No distention. No abdominal bruits. No CVA tenderness. Genitourinary:  Musculoskeletal: No lower extremity tenderness nor edema.  No joint effusions. Neurologic:  Normal speech and language. No gross focal neurologic deficits are appreciated. No facial droop Skin:  Skin is warm, dry and intact. No rash noted. Psychiatric: Mood and affect are normal. Speech and behavior are normal.  ____________________________________________   LABS (all labs ordered are listed, but only abnormal results are displayed)  Results for orders placed or performed during the hospital encounter of 10/01/17 (from the past 24 hour(s))  Lipase, blood     Status: None   Collection Time: 10/01/17  6:41 PM  Result Value Ref Range   Lipase 46 11 - 51 U/L  Comprehensive metabolic panel     Status: Abnormal   Collection Time: 10/01/17  6:41 PM  Result Value Ref Range   Sodium 136 135 - 145 mmol/L   Potassium 4.8 3.5 - 5.1 mmol/L   Chloride 96 (L) 101 - 111 mmol/L   CO2 28 22 - 32 mmol/L   Glucose, Bld 92 65 - 99 mg/dL   BUN 29 (H) 6 - 20 mg/dL   Creatinine, Ser 6.60 (H) 0.61 - 1.24 mg/dL   Calcium 8.9 8.9 - 10.3 mg/dL   Total Protein 7.4 6.5 - 8.1 g/dL   Albumin 3.5 3.5 - 5.0 g/dL   AST 32 15 - 41 U/L   ALT 17 17 - 63 U/L   Alkaline Phosphatase  213 (H) 38 - 126 U/L   Total Bilirubin 1.2 0.3 - 1.2 mg/dL   GFR calc non Af Amer 9 (L) >60 mL/min   GFR calc Af Amer 10 (L) >60 mL/min   Anion gap 12 5 - 15  CBC     Status: Abnormal   Collection Time: 10/01/17  6:41 PM  Result Value Ref Range   WBC 4.8 3.8 - 10.6 K/uL   RBC 3.44 (L) 4.40 - 5.90 MIL/uL   Hemoglobin 11.2 (L) 13.0 - 18.0 g/dL   HCT 33.1 (L) 40.0 - 52.0 %   MCV 96.2 80.0 - 100.0 fL   MCH 32.5 26.0 - 34.0 pg   MCHC 33.8 32.0 - 36.0 g/dL   RDW 14.2 11.5 - 14.5 %   Platelets 128 (L) 150 - 440 K/uL  Influenza panel by PCR (type A & B)  Status: None   Collection Time: 10/01/17  9:45 PM  Result Value Ref Range   Influenza A By PCR NEGATIVE NEGATIVE   Influenza B By PCR NEGATIVE NEGATIVE   ____________________________________________  EKG My review and personal interpretation at Time: 23:30   Indication: chest pain  Rate: 80  Rhythm: sinus Axis: normal Other:  Normal intervals, no stemi, nonspecific st and t wave abn unchanged from previous ekg 08/24/17 ____________________________________________  RADIOLOGY  I personally reviewed all radiographic images ordered to evaluate for the above acute complaints and reviewed radiology reports and findings.  These findings were personally discussed with the patient.  Please see medical record for radiology report.  ____________________________________________   PROCEDURES  Procedure(s) performed:  Procedures    Critical Care performed: no ____________________________________________   INITIAL IMPRESSION / ASSESSMENT AND PLAN / ED COURSE  Pertinent labs & imaging results that were available during my care of the patient were reviewed by me and considered in my medical decision making (see chart for details).  DDX: Flu, enteritis, gastritis, SBP, pancreatitis, pneumonia, headache, migraine, tension, meningitis  Mario [name] is a 47 y.o. who presents to the ED with symptoms as described above.  Patient  afebrile Heema dynamically stable.  Patient well-known to this department.  Has had multiple admissions for similar symptoms with grossly negative workup.  Today's blood work is reassuring.  No hyperkalemia.  No uremia.  No acidosis.  No leukocytosis.  Flu is negative.  Chest x-ray shows no pneumonia.  Abdominal exam shows no obstructive process.  His abdominal exam is diffusely nontender and I do not believe this is representative SBP.  I think it be more likely to introduce bacteria with diagnostic paracentesis at this time.  Possible migraine headache.  CT imaging of head ordered to evaluate for bleed.  Symptoms seem to be primarily viral given multiple episodes of enteritis in the area.  Will give antiemetics.  Clinical Course as of Oct 03 22  Thu Oct 01, 2017  2326 With interpreter at bedside patient reassessed.  Patient admits that these had multiple episodes of these types of symptoms.  Review of medical record shows that multiple of these admissions have resulted in negative results.  Given his worsening headache will order CT scan to evaluate for subarachnoid.  Will give additional IV antiemetics.  EKG is unchanged from previous.  [PR]    Clinical Course User Index [PR] Merlyn Lot, MD   Patient will be signed out to Dr. Owens Shark pending reassessment and follow-up troponin.  ____________________________________________   FINAL CLINICAL IMPRESSION(S) / ED DIAGNOSES  Final diagnoses:  Nausea vomiting and diarrhea  Acute nonintractable headache, unspecified headache type  ESRD on dialysis [facility])      NEW MEDICATIONS STARTED DURING THIS VISIT:  New Prescriptions   No medications on file     Note:  This document was prepared using Dragon voice recognition software and may include unintentional dictation errors.    Merlyn Lot, MD 10/02/17 [phone]

## 2017-10-01 NOTE — ED Triage Notes (Signed)
Pt arrived with complaints of vomiting for the last 3 day. Pt denies any diarrhea. Pt states he also has generalized body aches. Pt receives dialysis on T/Th/Sat. Pt states he went both days this week.

## 2017-10-02 ENCOUNTER — Observation Stay: Payer: Self-pay

## 2017-10-02 ENCOUNTER — Other Ambulatory Visit: Payer: Self-pay

## 2017-10-02 DIAGNOSIS — K529 Noninfective gastroenteritis and colitis, unspecified: Secondary | ICD-10-CM | POA: Diagnosis present

## 2017-10-02 LAB — TROPONIN I
TROPONIN I: 0.05 ng/mL — AB (ref <0.03)
Troponin I: 0.05 ng/mL (ref <0.03)

## 2017-10-02 LAB — BASIC METABOLIC PANEL
ANION GAP: 12 (ref 5–15)
BUN: 33 mg/dL — AB (ref 6–20)
CHLORIDE: 96 mmol/L — AB (ref 101–111)
CO2: 25 mmol/L (ref 22–32)
Calcium: 8.4 mg/dL — ABNORMAL LOW (ref 8.9–10.3)
Creatinine, Ser: 7.99 mg/dL — ABNORMAL HIGH (ref 0.61–1.24)
GFR calc Af Amer: 8 mL/min — ABNORMAL LOW (ref >60)
GFR calc non Af Amer: 7 mL/min — ABNORMAL LOW (ref >60)
Glucose, Bld: 88 mg/dL (ref 65–99)
Potassium: 5 mmol/L (ref 3.5–5.1)
SODIUM: 133 mmol/L — AB (ref 135–145)

## 2017-10-02 LAB — CBC
HCT: 31.8 % — ABNORMAL LOW (ref 40.0–52.0)
Hemoglobin: 10.4 g/dL — ABNORMAL LOW (ref 13.0–18.0)
MCH: 32 pg (ref 26.0–34.0)
MCHC: 32.8 g/dL (ref 32.0–36.0)
MCV: 97.7 fL (ref 80.0–100.0)
Platelets: 124 10*3/uL — ABNORMAL LOW (ref 150–440)
RBC: 3.26 MIL/uL — AB (ref 4.40–5.90)
RDW: 14.4 % (ref 11.5–14.5)
WBC: 4.1 10*3/uL (ref 3.8–10.6)

## 2017-10-02 LAB — MRSA PCR SCREENING: MRSA by PCR: NEGATIVE

## 2017-10-02 LAB — PREALBUMIN: Prealbumin: 17.2 mg/dL — ABNORMAL LOW (ref 18–38)

## 2017-10-02 MED ORDER — SODIUM CHLORIDE 0.9% FLUSH: 3.0000 mL | INTRAVENOUS | Status: DC | PRN

## 2017-10-02 MED ORDER — ONDANSETRON HCL 4 MG/2ML IJ SOLN: 4.0000 mg | Freq: Four times a day (QID) | INTRAMUSCULAR | Status: DC | PRN

## 2017-10-02 MED ORDER — SODIUM CHLORIDE 0.9 % IV SOLN: INTRAVENOUS | Status: DC

## 2017-10-02 MED ORDER — NEPRO/CARBSTEADY PO LIQD: 237.0000 mL | Freq: Two times a day (BID) | ORAL | Status: DC

## 2017-10-02 MED ORDER — TOPIRAMATE 25 MG PO TABS
25.0000 mg | ORAL_TABLET | Freq: Two times a day (BID) | ORAL | Status: DC | PRN
  Filled 2017-10-02: qty 1

## 2017-10-02 MED ORDER — ONDANSETRON HCL 4 MG PO TABS: 4.0000 mg | ORAL_TABLET | Freq: Four times a day (QID) | ORAL | Status: DC | PRN

## 2017-10-02 MED ORDER — CALCIUM ACETATE (PHOS BINDER) 667 MG PO CAPS
2001.0000 mg | ORAL_CAPSULE | Freq: Three times a day (TID) | ORAL | Status: DC
  Administered 2017-10-02 (×2): 2001 mg via ORAL
  Filled 2017-10-02 (×2): qty 3

## 2017-10-02 MED ORDER — ALBUTEROL SULFATE HFA 108 (90 BASE) MCG/ACT IN AERS: 2.0000 | INHALATION_SPRAY | Freq: Four times a day (QID) | RESPIRATORY_TRACT | 0 refills | Status: DC | PRN

## 2017-10-02 MED ORDER — PREDNISONE 50 MG PO TABS: 50.0000 mg | ORAL_TABLET | Freq: Every day | ORAL | 0 refills | Status: DC

## 2017-10-02 MED ORDER — AMLODIPINE BESYLATE 10 MG PO TABS
10.0000 mg | ORAL_TABLET | Freq: Every day | ORAL | Status: DC
Start: 2017-10-02 — End: 2017-10-02
  Administered 2017-10-02: 10 mg via ORAL
  Filled 2017-10-02: qty 1

## 2017-10-02 MED ORDER — ONDANSETRON HCL 4 MG/2ML IJ SOLN
INTRAMUSCULAR | Status: AC
  Filled 2017-10-02: qty 2

## 2017-10-02 MED ORDER — MORPHINE SULFATE (PF) 2 MG/ML IV SOLN: 2.0000 mg | INTRAVENOUS | Status: DC | PRN

## 2017-10-02 MED ORDER — METOPROLOL TARTRATE 25 MG PO TABS
25.0000 mg | ORAL_TABLET | Freq: Two times a day (BID) | ORAL | Status: DC
  Administered 2017-10-02: 25 mg via ORAL
  Filled 2017-10-02: qty 1

## 2017-10-02 MED ORDER — BOOST / RESOURCE BREEZE PO LIQD CUSTOM: 1.0000 | Freq: Three times a day (TID) | ORAL | Status: DC

## 2017-10-02 MED ORDER — ACETAMINOPHEN 650 MG RE SUPP: 650.0000 mg | Freq: Four times a day (QID) | RECTAL | Status: DC | PRN

## 2017-10-02 MED ORDER — SODIUM CHLORIDE 0.9 % IV SOLN: 250.0000 mL | INTRAVENOUS | Status: DC | PRN

## 2017-10-02 MED ORDER — ACETAMINOPHEN 325 MG PO TABS: 650.0000 mg | ORAL_TABLET | Freq: Four times a day (QID) | ORAL | Status: DC | PRN

## 2017-10-02 MED ORDER — FERRIC CITRATE 1 GM 210 MG(FE) PO TABS
420.0000 mg | ORAL_TABLET | Freq: Three times a day (TID) | ORAL | Status: DC
  Administered 2017-10-02: 420 mg via ORAL
  Filled 2017-10-02 (×3): qty 2

## 2017-10-02 MED ORDER — SODIUM CHLORIDE 0.9% FLUSH
3.0000 mL | Freq: Two times a day (BID) | INTRAVENOUS | Status: DC
  Administered 2017-10-02: 3 mL via INTRAVENOUS

## 2017-10-02 MED ORDER — CIPROFLOXACIN HCL 500 MG PO TABS: 500.0000 mg | ORAL_TABLET | Freq: Every day | ORAL | Status: DC

## 2017-10-02 MED ORDER — VITAMIN C 500 MG PO TABS
500.0000 mg | ORAL_TABLET | Freq: Every day | ORAL | Status: DC
  Administered 2017-10-02: 500 mg via ORAL
  Filled 2017-10-02: qty 1

## 2017-10-02 MED ORDER — CHOLESTYRAMINE 4 G PO PACK
4.0000 g | PACK | Freq: Three times a day (TID) | ORAL | Status: DC
  Filled 2017-10-02 (×3): qty 1

## 2017-10-02 MED ORDER — HYDRALAZINE HCL 20 MG/ML IJ SOLN: 10.0000 mg | INTRAMUSCULAR | Status: DC | PRN

## 2017-10-02 MED ORDER — PREDNISONE 50 MG PO TABS
50.0000 mg | ORAL_TABLET | Freq: Every day | ORAL | Status: DC
  Administered 2017-10-02: 50 mg via ORAL
  Filled 2017-10-02: qty 1

## 2017-10-02 MED ORDER — RENA-VITE PO TABS: 1.0000 | ORAL_TABLET | Freq: Every day | ORAL | Status: DC

## 2017-10-02 MED ORDER — IPRATROPIUM-ALBUTEROL 0.5-2.5 (3) MG/3ML IN SOLN
3.0000 mL | Freq: Once | RESPIRATORY_TRACT | Status: AC
  Administered 2017-10-02: 3 mL via RESPIRATORY_TRACT
  Filled 2017-10-02: qty 3

## 2017-10-02 MED ORDER — HEPARIN SODIUM (PORCINE) 5000 UNIT/ML IJ SOLN
5000.0000 [IU] | Freq: Three times a day (TID) | INTRAMUSCULAR | Status: DC
  Administered 2017-10-02: 5000 [IU] via SUBCUTANEOUS
  Filled 2017-10-02: qty 1

## 2017-10-02 NOTE — Discharge Instructions (Signed)
Resume diet and activity as before ° ° °

## 2017-10-02 NOTE — Progress Notes (Signed)
Mario [name]  A and O x 4. VSS. Pt tolerating diet well. No complaints of pain or nausea. IV removed intact, prescriptions given. Pt voiced understanding of discharge instructions with no further questions. Pt discharged via wheelchair with axillary.    Allergies as of 10/02/2017      Reactions   Betadine [povidone Iodine] Rash      Medication List    TAKE these medications   albuterol 108 (90 Base) MCG/ACT inhaler Commonly known as:  PROVENTIL HFA;VENTOLIN HFA Inhale 2 puffs into the lungs every 6 (six) hours as needed for wheezing or shortness of breath.   amLODipine 10 MG tablet Commonly known as:  NORVASC Take 1 tablet (10 mg total) by mouth daily.   AURYXIA 1 GM 210 MG(Fe) tablet Generic drug:  ferric citrate Take 420 mg by mouth 3 (three) times daily with meals.   calcium acetate 667 MG tablet Commonly known as:  PHOSLO Take 3 tablets (2,001 mg total) by mouth 3 (three) times daily with meals.   cholestyramine 4 g packet Commonly known as:  QUESTRAN Take 1 packet (4 g total) by mouth 3 (three) times daily with meals.   ciprofloxacin 500 MG tablet Commonly known as:  CIPRO Take 1 tablet (500 mg total) by mouth at bedtime.   metoprolol tartrate 25 MG tablet Commonly known as:  LOPRESSOR Take 1 tablet (25 mg total) by mouth 2 (two) times daily.   pantoprazole 40 MG tablet Commonly known as:  PROTONIX Take 1 tablet (40 mg total) by mouth daily.   predniSONE 50 MG tablet Commonly known as:  DELTASONE Take 1 tablet (50 mg total) by mouth daily with breakfast. Start taking on:  10/03/2017   traMADol 50 MG tablet Commonly known as:  ULTRAM Take 1 tablet (50 mg total) by mouth every 6 (six) hours as needed for severe pain.       Vitals:   10/02/17 0711 10/02/17 0858  BP: (!) 145/69 (!) 167/80  Pulse: 75 82  Resp: 20   Temp: 98.8 F (37.1 C)   SpO2: 97%     Mario [name]

## 2017-10-02 NOTE — Progress Notes (Signed)
[facility]  ROUNDING NOTE   Subjective:   [name] admitted to [facility] on 10/01/2017 for Emesis [R11.10] ESRD on dialysis [facility]) [N18.6, Z99.2] Nausea vomiting and diarrhea [R11.2, R19.7] Acute nonintractable headache, unspecified headache type [R51]   Objective:  Vital signs in last 24 hours:  Temp:  [98.8 F (37.1 C)-98.9 F (37.2 C)] 98.8 F (37.1 C) (03/01 0711) Pulse Rate:  [75-91] 82 (03/01 0858) Resp:  [20-33] 20 (03/01 0711) BP: (145-167)/(69-80) 167/80 (03/01 0858) SpO2:  [95 %-100 %] 97 % (03/01 0711) Weight:  [69.4 kg (152 lb 16 oz)-77.1 kg (170 lb)] 69.4 kg (152 lb 16 oz) (03/01 1032)  Weight change:  Filed Weights   10/01/17 1842 10/02/17 1032  Weight: 77.1 kg (170 lb) 69.4 kg (152 lb 16 oz)    Intake/Output: No intake/output data recorded.   Intake/Output this shift:  Total I/O In: 660 [P.O.:660] Out: -   Physical Exam: General: NAD,   Head: Normocephalic, atraumatic. Moist oral mucosal membranes  Eyes: Anicteric, PERRL  Neck: Supple, trachea midline  Lungs:  Clear to auscultation  Heart: Regular rate and rhythm  Abdomen:  Soft, nontender,   Extremities: no peripheral edema.  Neurologic: Nonfocal, moving all four extremities  Skin: No lesions  Access: Left AVF    Basic Metabolic Panel: Recent Labs  Lab 10/01/17 1841 10/02/17 0803  NA 136 133*  K 4.8 5.0  CL 96* 96*  CO2 28 25  GLUCOSE 92 88  BUN 29* 33*  CREATININE 6.60* 7.99*  CALCIUM 8.9 8.4*    Liver Function Tests: Recent Labs  Lab 10/01/17 1841  AST 32  ALT 17  ALKPHOS 213*  BILITOT 1.2  PROT 7.4  ALBUMIN 3.5   Recent Labs  Lab 10/01/17 1841  LIPASE 46   No results for input(s): AMMONIA in the last 168 hours.  CBC: Recent Labs  Lab 10/01/17 1841 10/02/17 0803  WBC 4.8 4.1  HGB 11.2* 10.4*  HCT 33.1* 31.8*  MCV 96.2 97.7  PLT 128* 124*    Cardiac Enzymes: Recent Labs  Lab 10/01/17 1841 10/02/17 0252  TROPONINI 0.05* 0.05*     BNP: Invalid input(s): POCBNP  CBG: No results for input(s): GLUCAP in the last 168 hours.  Microbiology: Results for orders placed or performed during the hospital encounter of 10/01/17  MRSA PCR Screening     Status: None   Collection Time: 10/02/17  8:16 AM  Result Value Ref Range Status   MRSA by PCR NEGATIVE NEGATIVE Final    Comment:        The GeneXpert MRSA Assay (FDA approved for NASAL specimens only), is one component of a comprehensive MRSA colonization surveillance program. It is not intended to diagnose MRSA infection nor to guide or monitor treatment for MRSA infections. Performed at [facility], [address] [place], [place] [address]     Coagulation Studies: No results for input(s): LABPROT, INR in the last 72 hours.  Urinalysis: No results for input(s): COLORURINE, LABSPEC, PHURINE, GLUCOSEU, HGBUR, BILIRUBINUR, KETONESUR, PROTEINUR, UROBILINOGEN, NITRITE, LEUKOCYTESUR in the last 72 hours.  Invalid input(s): APPERANCEUR    Imaging: Ct Head Wo Contrast  Result Date: 10/01/2017 CLINICAL DATA:  47 year old male with acute headache.  Vomiting. EXAM: CT HEAD WITHOUT CONTRAST TECHNIQUE: Contiguous axial images were obtained from the base of the skull through the vertex without intravenous contrast. COMPARISON:  None. FINDINGS: Brain: No evidence of acute infarction, hemorrhage, hydrocephalus, extra-axial collection or mass lesion/mass effect. Vascular:  No hyperdense vessel or unexpected calcification. Skull: There is heterogeneous appearance of the skull with "salt and pepper " appearance which may be related to underlying hyperparathyroidism or related to chronic renal disease. Clinical correlation is recommended. No acute calvarial pathology. Sinuses/Orbits: Mild mucoperiosteal thickening of paranasal sinuses. No air-fluid level. The mastoid air cells are clear. Other: None IMPRESSION: Normal noncontrast CT of the brain. Electronically Signed    By: Anner Crete M.D.   On: 10/01/2017 23:25   Dg Abdomen Acute W/chest  Result Date: 10/01/2017 CLINICAL DATA:  47 year old male with epigastric pain. Evaluate for bowel obstruction. Cirrhosis and ascites. EXAM: DG ABDOMEN ACUTE W/ 1V CHEST COMPARISON:  CT of the abdomen pelvis dated 08/20/2017 FINDINGS: There is mild vascular congestion. No focal consolidation, pleural effusion, or pneumothorax. The cardiac silhouette is within normal limits. No acute osseous pathology. There is no bowel dilatation or evidence of obstruction. No free air or radiopaque calculi. The osseous structures and soft tissues appear unremarkable. IMPRESSION: 1. Mild pulmonary vascular congestion.  No focal consolidation. 2. No bowel obstruction. Electronically Signed   By: Anner Crete M.D.   On: 10/01/2017 21:22     Medications:   . sodium chloride     . amLODipine  10 mg Oral Daily  . calcium acetate  2,001 mg Oral TID WC  . cholestyramine  4 g Oral TID WC  . ciprofloxacin  500 mg Oral QHS  . ferric citrate  420 mg Oral TID WC  . heparin  5,000 Units Subcutaneous Q8H  . ipratropium-albuterol  3 mL Nebulization Once  . metoprolol tartrate  25 mg Oral BID  . ondansetron      . predniSONE  50 mg Oral Q breakfast  . sodium chloride flush  3 mL Intravenous Q12H   sodium chloride, acetaminophen **OR** acetaminophen, hydrALAZINE, ondansetron **OR** ondansetron (ZOFRAN) IV, promethazine, sodium chloride flush, topiramate  Assessment/ Plan:  [name] is a 47 y.o. Hispanic male 47 y.o. male with end stage renal disease on hemodialysis, hypertension, hepatic alcoholic cirrhosis, congestive heart failure  [facility] [place] TTS  1.  End-stage renal disease: last hemodialysis was Thursday. No acute indication for dialysis.  - Continue TTS schedule.   2.  Anemia of chronic kidney disease: hemoglobin 10.4 - EPO with HD treatment  3.  Secondary hyperparathyroidism - Calcium  acetate  4. Hypertension:  - amlodipine, metoprolol   LOS: 0 Mario [name] 3/1/201911:52 AM

## 2017-10-02 NOTE — Progress Notes (Signed)
Initial Nutrition Assessment  DOCUMENTATION CODES:   Non-severe (moderate) malnutrition in context of chronic illness  INTERVENTION:   Boost Breeze po TID, each supplement provides 250 kcal and 9 grams of protein  Rena-vite daily   Vitamin C 500mg  BID  NUTRITION DIAGNOSIS:   Moderate Malnutrition related to chronic illness(ESRD on HD) as evidenced by moderate fat depletion, moderate muscle depletion.  GOAL:   Patient will meet greater than or equal to 90% of their needs  MONITOR:   PO intake, Supplement acceptance, Weight trends, Labs, I & O's, Skin  REASON FOR ASSESSMENT:   Consult Assessment of nutrition requirement/status  ASSESSMENT:   47 y.o. male with h/o ESRD on HD, scurvy, CHF, cirrhosis, SBP, presents with 3 days of nausea vomiting diarrhea headache.    Visited pt's room today. Pt sleeping at time of RD visit. RD familiar with this pt from multiple previous admits. Pt does not eat well at baseline. Pt diagnosed with scurvy in November 2018, but does not take vitamin C at home d/t financial reasons. Pt also with h/o vitamin D deficiency; last lab in November 2018 was 25. Pt does get vitamin D injections during dialysis. Pt likes Nepro; pt currently on CL diet so will order Boost Breeze for now and change to Nepro when diet advanced. Per chart, pt is weight stable. RD will monitor for diet advancement.   Medications reviewed and include: phoslo, cholestyramine, ciprofloxacin, ferric citrate, heparin, zofran, prednisone  Labs reviewed: Na 133(L), Cl 96(L), BUN 33(H), creat 7.99(H), Ca 8.4(L) Hgb 10.4(L), Hct 31.8(L) iPTH- 1745(H)- 01/2017 Vit D, 25-hydroxy- 24.9(L)- 06/2017  Nutrition-Focused physical exam completed. Findings are moderate fat depletions in arms and chest, moderate muscle depletions in clavicles, shoulders, and BLE, and no edema.   Diet Order:  Diet renal with fluid restriction Fluid restriction: 1200 mL Fluid; Room service appropriate? Yes; Fluid  consistency: Thin  EDUCATION NEEDS:   No education needs have been identified at this time  Skin:  Reviewed RN Assessment  Last BM:  2/28  Height:   Ht Readings from Last 1 Encounters:  10/01/17 5' 2.99" (1.6 m)    Weight:   Wt Readings from Last 1 Encounters:  10/02/17 152 lb 16 oz (69.4 kg)    Ideal Body Weight:  56.3 kg  BMI:  Body mass index is 27.11 kg/m.  Estimated Nutritional Needs:   Kcal:  1800-2100kcal/day   Protein:  90-104g/day   Fluid:  per MD  Koleen Distance MS, RD, LDN Pager #[phone] After Hours Pager: [phone]

## 2017-10-02 NOTE — H&P (Signed)
[facility] [place] NAME: Mario [name]    MR#:  [id number]  DATE OF BIRTH:  07-08-1971  DATE OF ADMISSION:  10/01/2017  PRIMARY CARE PHYSICIAN: [name], MD   REQUESTING/REFERRING PHYSICIAN:   CHIEF COMPLAINT:   Chief Complaint  Patient presents with  . Emesis    HISTORY OF PRESENT ILLNESS: Mario [name]  is a 47 y.o. male with a known history per below, noncompliance with medical management, presents emergency room with 3-day history of nausea and vomiting with associated headache, patient denies any diarrhea or abdominal pain, in the emergency room workup was largely unimpressive, CT head negative for any acute process, chest x-ray noted for mild pulmonary vascular congestion, troponin elevation 0.05, hemoglobin 11, creatinine 6.6-last dialysis was on yesterday, patient evaluated the bedside emergency room stress, resting comfortably in bed, patient is now been admitted for acute nausea/emesis with associated headache suspicious for possible acute gastroenteritis.  PAST MEDICAL HISTORY:   Past Medical History:  Diagnosis Date  . Abdominal pain 08/28/2016  . Acute hyperkalemia 10/28/2016  . Chest pain 04/11/2016  . Chronic combined systolic and diastolic CHF (congestive heart failure) ([place])   . Cirrhosis ([place])   . Dialysis patient ([place])   . ESRD (end stage renal disease) on dialysis ([place]) 01/25/2015  . Fluid overload 04/12/2016  . HTN (hypertension) 10/13/2016  . Hyperkalemia 11/27/2016  . Hypertension   . Pulmonary edema 07/03/2016  . Renal disorder   . Renal insufficiency   . Scrotal infection 01/25/2015    PAST SURGICAL HISTORY:  Past Surgical History:  Procedure Laterality Date  . AV FISTULA PLACEMENT      SOCIAL HISTORY:  Social History   Tobacco Use  . Smoking status: Current Every Day Smoker    Packs/day: 0.25    Years: 25.00    Pack years: 6.25    Types: Cigarettes  . Smokeless tobacco: Never Used   Substance Use Topics  . Alcohol use: No    Comment: 1/2 bottle tequilla per day    FAMILY HISTORY:  Family History  Problem Relation Age of Onset  . Kidney failure Father   . Diabetes Mother     DRUG ALLERGIES:  Allergies  Allergen Reactions  . Betadine [Povidone Iodine] Rash    REVIEW OF SYSTEMS:   CONSTITUTIONAL: No fever, fatigue or weakness.  EYES: No blurred or double vision.  EARS, NOSE, AND THROAT: No tinnitus or ear pain.  RESPIRATORY: No cough, shortness of breath, wheezing or hemoptysis.  CARDIOVASCULAR: No chest pain, orthopnea, edema.  GASTROINTESTINAL: + nausea, vomiting, no diarrhea or abdominal pain.  GENITOURINARY: No dysuria, hematuria.  ENDOCRINE: No polyuria, nocturia,  HEMATOLOGY: No anemia, easy bruising or bleeding SKIN: No rash or lesion. MUSCULOSKELETAL: No joint pain or arthritis.   NEUROLOGIC: No tingling, numbness, weakness.  Positive headache PSYCHIATRY: No anxiety or depression.   MEDICATIONS AT HOME:  Prior to Admission medications   Medication Sig Start Date End Date Taking? Authorizing Provider  amLODipine (NORVASC) 10 MG tablet Take 1 tablet (10 mg total) by mouth daily. 08/23/17  Yes Sudini, Alveta Heimlich, MD  calcium acetate (PHOSLO) 667 MG tablet Take 3 tablets (2,001 mg total) by mouth 3 (three) times daily with meals. 08/23/17  Yes Sudini, Alveta Heimlich, MD  cholestyramine (QUESTRAN) 4 g packet Take 1 packet (4 g total) by mouth 3 (three) times daily with meals. 08/23/17  Yes Sudini, Alveta Heimlich, MD  ferric citrate (AURYXIA) 1 GM 210 MG(Fe) tablet  Take 420 mg by mouth 3 (three) times daily with meals.   Yes [provider]  metoprolol tartrate (LOPRESSOR) 25 MG tablet Take 1 tablet (25 mg total) by mouth 2 (two) times daily. 08/23/17  Yes Sudini, Alveta Heimlich, MD  pantoprazole (PROTONIX) 40 MG tablet Take 1 tablet (40 mg total) by mouth daily. 08/23/17  Yes Sudini, Alveta Heimlich, MD  traMADol (ULTRAM) 50 MG tablet Take 1 tablet (50 mg total) by mouth every 6  (six) hours as needed for severe pain. 08/23/17 08/23/18 Yes Sudini, Alveta Heimlich, MD  ciprofloxacin (CIPRO) 500 MG tablet Take 1 tablet (500 mg total) by mouth at bedtime. Patient not taking: Reported on 10/01/2017 08/23/17   Hillary Bow, MD      PHYSICAL EXAMINATION:   VITAL SIGNS: Blood pressure (!) 158/75, pulse 82, temperature 98.9 F (37.2 C), temperature source Oral, resp. rate (!) 29, height 5' 2.99" (1.6 m), weight 77.1 kg (170 lb), SpO2 96 %.  GENERAL:  47 y.o.-year-old patient lying in the bed with no acute distress.  Frail-appearing EYES: Pupils equal, round, reactive to light and accommodation. No scleral icterus. Extraocular muscles intact.  HEENT: Head atraumatic, normocephalic. Oropharynx and nasopharynx clear.  NECK:  Supple, no jugular venous distention. No thyroid enlargement, no tenderness.  LUNGS: Normal breath sounds bilaterally, no wheezing, rales,rhonchi or crepitation. No use of accessory muscles of respiration.  CARDIOVASCULAR: S1, S2 normal. No murmurs, rubs, or gallops.  ABDOMEN: Soft, nontender, mild distention. Bowel sounds present. No organomegaly or mass.  EXTREMITIES: No pedal edema, cyanosis, or clubbing.  NEUROLOGIC: Cranial nerves II through XII are intact. Muscle strength 5/5 in all extremities. Sensation intact. Gait not checked.  PSYCHIATRIC: The patient is alert and oriented x 3.  SKIN: No obvious rash, lesion, or ulcer.   LABORATORY PANEL:   CBC Recent Labs  Lab 10/01/17 1841  WBC 4.8  HGB 11.2*  HCT 33.1*  PLT 128*  MCV 96.2  MCH 32.5  MCHC 33.8  RDW 14.2   ------------------------------------------------------------------------------------------------------------------  Chemistries  Recent Labs  Lab 10/01/17 1841  NA 136  K 4.8  CL 96*  CO2 28  GLUCOSE 92  BUN 29*  CREATININE 6.60*  CALCIUM 8.9  AST 32  ALT 17  ALKPHOS 213*  BILITOT 1.2    ------------------------------------------------------------------------------------------------------------------ estimated creatinine clearance is 12.7 mL/min (A) (by C-G formula based on SCr of 6.6 mg/dL (H)). ------------------------------------------------------------------------------------------------------------------ No results for input(s): TSH, T4TOTAL, T3FREE, THYROIDAB in the last 72 hours.  Invalid input(s): FREET3   Coagulation profile No results for input(s): INR, PROTIME in the last 168 hours. ------------------------------------------------------------------------------------------------------------------- No results for input(s): DDIMER in the last 72 hours. -------------------------------------------------------------------------------------------------------------------  Cardiac Enzymes Recent Labs  Lab 10/01/17 1841  TROPONINI 0.05*   ------------------------------------------------------------------------------------------------------------------ Invalid input(s): POCBNP  ---------------------------------------------------------------------------------------------------------------  Urinalysis No results found for: COLORURINE, APPEARANCEUR, LABSPEC, PHURINE, GLUCOSEU, HGBUR, BILIRUBINUR, KETONESUR, PROTEINUR, UROBILINOGEN, NITRITE, LEUKOCYTESUR   RADIOLOGY: Ct Head Wo Contrast  Result Date: 10/01/2017 CLINICAL DATA:  47 year old male with acute headache.  Vomiting. EXAM: CT HEAD WITHOUT CONTRAST TECHNIQUE: Contiguous axial images were obtained from the base of the skull through the vertex without intravenous contrast. COMPARISON:  None. FINDINGS: Brain: No evidence of acute infarction, hemorrhage, hydrocephalus, extra-axial collection or mass lesion/mass effect. Vascular: No hyperdense vessel or unexpected calcification. Skull: There is heterogeneous appearance of the skull with "salt and pepper " appearance which may be related to underlying  hyperparathyroidism or related to chronic renal disease. Clinical correlation is recommended. No acute calvarial pathology. Sinuses/Orbits: Mild mucoperiosteal  thickening of paranasal sinuses. No air-fluid level. The mastoid air cells are clear. Other: None IMPRESSION: Normal noncontrast CT of the brain. Electronically Signed   By: Anner Crete M.D.   On: 10/01/2017 23:25   Dg Abdomen Acute W/chest  Result Date: 10/01/2017 CLINICAL DATA:  47 year old male with epigastric pain. Evaluate for bowel obstruction. Cirrhosis and ascites. EXAM: DG ABDOMEN ACUTE W/ 1V CHEST COMPARISON:  CT of the abdomen pelvis dated 08/20/2017 FINDINGS: There is mild vascular congestion. No focal consolidation, pleural effusion, or pneumothorax. The cardiac silhouette is within normal limits. No acute osseous pathology. There is no bowel dilatation or evidence of obstruction. No free air or radiopaque calculi. The osseous structures and soft tissues appear unremarkable. IMPRESSION: 1. Mild pulmonary vascular congestion.  No focal consolidation. 2. No bowel obstruction. Electronically Signed   By: Anner Crete M.D.   On: 10/01/2017 21:22    EKG: Orders placed or performed during the hospital encounter of 10/01/17  . ED EKG  . ED EKG    IMPRESSION AND PLAN: 1 acute nausea/emesis With associated headache, no diarrhea or abdominal pain Suspicious for possible acute viral gastroenteritis Referred to the observation unit, gentle IV fluids for rehydration given end-stage renal disease, check acute abdominal series though last bowel movement was on yesterday, antiemetics PRN, strict I&O monitoring  2 acute headache Etiology unknown CT head negative Topamax as needed twice daily, morphine as needed severe pain  3 chronic end-stage renal disease Status post hemodialysis on yesterday Nephrology consulted for hemodialysis needs  4 chronic cirrhosis secondary to alcoholism States sober for 1-1/2 years-patient was  congratulated Continue conservative medical management  5 chronic noncompliance with medical management Importance of compliance was recommended as patient does not take his medication as directed  6 chronic benign essential hypertension Noncompliant with medications Resume home regiment  Disposition home on tomorrow barring any complications   All the records are reviewed and case discussed with ED provider. Management plans discussed with the patient, family and they are in agreement.  CODE STATUS:full Code Status History    Date Active Date Inactive Code Status Order ID Comments User Context   08/21/2017 02:08 08/23/2017 16:21 Full Code [id number]  Amelia Jo, MD ED   06/09/2017 20:48 06/13/2017 19:07 Full Code [id number]  Dustin Flock, MD Inpatient   01/06/2017 14:37 01/07/2017 17:44 Full Code [id number]  Epifanio Lesches, MD ED   11/27/2016 14:31 11/30/2016 14:23 Full Code [id number]  Henreitta Leber, MD Inpatient   10/28/2016 16:11 10/31/2016 16:38 Full Code [id number]  Epifanio Lesches, MD ED   10/13/2016 21:36 10/14/2016 21:23 Full Code [id number]  Lance Coon, MD Inpatient   08/28/2016 16:58 08/30/2016 17:19 Full Code [id number]  Fritzi Mandes, MD ED   07/03/2016 20:06 07/06/2016 16:22 Full Code [id number]  Fritzi Mandes, MD ED   04/11/2016 18:57 04/12/2016 07:16 Full Code [id number]  Vaughan Basta, MD Inpatient   01/25/2015 12:29 01/28/2015 16:26 Full Code [id number]  Dustin Flock, MD Inpatient       TOTAL TIME TAKING CARE OF THIS PATIENT: 45 minutes.    Avel Peace Sadaf Przybysz M.D on 10/02/2017   Between 7am to 6pm - Pager - [phone]  After 6pm go to [email or web address] - password [email or web address] [facility]  Office  [phone]  CC: Primary care physician; [name], MD   Note: This dictation was prepared with Dragon dictation along with smaller phrase technology. Any transcriptional errors that result from this process are unintentional.

## 2017-10-02 NOTE — Care Management (Signed)
RNCM confirmed with Medication Management  That he did fill prescriptions on April 2018. Patient declines wanting information for PCP.  RNCM still provided and explained applications to Medication Management , and [place].  Also provided "The Network:  Your Guide to [employer] and [employer] in [facility]"  Booklet.  Scripts for albuterol and steroids were sent to Medication Management .  Patient aware of where he needs to pick his medication up.  Elvera Bicker dialysis liaison notified of discharge.  RNCM signing off.

## 2017-10-02 NOTE — ED Notes (Signed)
Date and time results received: 10/02/17 0028 (use smartphrase ".now" to insert current time)  Test: Troponin Critical Value: 0.05  Name of Provider Notified: Dr. Owens Shark  Orders Received? Or Actions Taken?:

## 2017-10-02 NOTE — ED Notes (Signed)
#  [place] interpreter with Dr Jerelyn Charles

## 2017-10-02 NOTE — ED Notes (Signed)
Patient transported to 212

## 2017-10-07 LAB — ACID FAST SMEAR (AFB): ACID FAST SMEAR - AFSCU2: NEGATIVE

## 2017-10-07 LAB — FUNGUS CULTURE RESULT

## 2017-10-07 LAB — FUNGUS CULTURE WITH STAIN

## 2017-10-07 LAB — ACID FAST CULTURE WITH REFLEXED SENSITIVITIES (MYCOBACTERIA): Acid Fast Culture: NEGATIVE

## 2017-10-07 LAB — ACID FAST SMEAR (AFB, MYCOBACTERIA)

## 2017-10-07 LAB — FUNGAL ORGANISM REFLEX

## 2017-10-07 LAB — ACID FAST CULTURE WITH REFLEXED SENSITIVITIES

## 2017-10-13 NOTE — Discharge Summary (Signed)
[facility] [place] NAME: Mario [name]    MR#:  [id number]  DATE OF BIRTH:  Jul 01, 1971  DATE OF ADMISSION:  10/01/2017 ADMITTING PHYSICIAN: Marquis Buggy, MD  DATE OF DISCHARGE: 10/02/2017  1:54 PM  PRIMARY CARE PHYSICIAN: [name], MD   ADMISSION DIAGNOSIS:  Emesis [R11.10] ESRD on dialysis ([place]) [N18.6, Z99.2] Nausea vomiting and diarrhea [R11.2, R19.7] Acute nonintractable headache, unspecified headache type [R51]  DISCHARGE DIAGNOSIS:  Active Problems:   Gastroenteritis   SECONDARY DIAGNOSIS:   Past Medical History:  Diagnosis Date  . Abdominal pain 08/28/2016  . Acute hyperkalemia 10/28/2016  . Chest pain 04/11/2016  . Chronic combined systolic and diastolic CHF (congestive heart failure) ([place])   . Cirrhosis ([place])   . Dialysis patient ([place])   . ESRD (end stage renal disease) on dialysis ([place]) 01/25/2015  . Fluid overload 04/12/2016  . HTN (hypertension) 10/13/2016  . Hyperkalemia 11/27/2016  . Hypertension   . Pulmonary edema 07/03/2016  . Renal disorder   . Renal insufficiency   . Scrotal infection 01/25/2015     ADMITTING HISTORY  HISTORY OF PRESENT ILLNESS: Mario [name]  is a 47 y.o. male with a known history per below, noncompliance with medical management, presents emergency room with 3-day history of nausea and vomiting with associated headache, patient denies any diarrhea or abdominal pain, in the emergency room workup was largely unimpressive, CT head negative for any acute process, chest x-ray noted for mild pulmonary vascular congestion, troponin elevation 0.05, hemoglobin 11, creatinine 6.6-last dialysis was on yesterday, patient evaluated the bedside emergency room stress, resting comfortably in bed, patient is now been admitted for acute nausea/emesis with associated headache suspicious for possible acute gastroenteritis.    HOSPITAL COURSE:   * Chronic vomiting due to gastritis and  cirrhosis Treated symptomatically and improved. Unfortunately patient has no medical insurance unable to get medications causing recurrent admissions. For further ED visits prescription would help to prevent readmissions.  * ESRD Did not need HD in the hospital  * AOCD stable  Non compliance - counseled  Stable for discharge  CONSULTS OBTAINED:  Treatment Team:  Lavonia Dana, MD  DRUG ALLERGIES:   Allergies  Allergen Reactions  . Betadine [Povidone Iodine] Rash    DISCHARGE MEDICATIONS:   Allergies as of 10/02/2017      Reactions   Betadine [povidone Iodine] Rash      Medication List    TAKE these medications   albuterol 108 (90 Base) MCG/ACT inhaler Commonly known as:  PROVENTIL HFA;VENTOLIN HFA Inhale 2 puffs into the lungs every 6 (six) hours as needed for wheezing or shortness of breath.   amLODipine 10 MG tablet Commonly known as:  NORVASC Take 1 tablet (10 mg total) by mouth daily.   AURYXIA 1 GM 210 MG(Fe) tablet Generic drug:  ferric citrate Take 420 mg by mouth 3 (three) times daily with meals.   calcium acetate 667 MG tablet Commonly known as:  PHOSLO Take 3 tablets (2,001 mg total) by mouth 3 (three) times daily with meals.   cholestyramine 4 g packet Commonly known as:  QUESTRAN Take 1 packet (4 g total) by mouth 3 (three) times daily with meals.   ciprofloxacin 500 MG tablet Commonly known as:  CIPRO Take 1 tablet (500 mg total) by mouth at bedtime.   metoprolol tartrate 25 MG tablet Commonly known as:  LOPRESSOR Take 1 tablet (25 mg total) by mouth 2 (two) times daily.  pantoprazole 40 MG tablet Commonly known as:  PROTONIX Take 1 tablet (40 mg total) by mouth daily.   predniSONE 50 MG tablet Commonly known as:  DELTASONE Take 1 tablet (50 mg total) by mouth daily with breakfast.   traMADol 50 MG tablet Commonly known as:  ULTRAM Take 1 tablet (50 mg total) by mouth every 6 (six) hours as needed for severe pain.        Today   VITAL SIGNS:  Blood pressure (!) 167/80, pulse 82, temperature 98.8 F (37.1 C), temperature source Oral, resp. rate 20, height 5' 2.99" (1.6 m), weight 69.4 kg (152 lb 16 oz), SpO2 97 %.  I/O:  No intake or output data in the 24 hours ending 10/13/17 2117  PHYSICAL EXAMINATION:  Physical Exam  GENERAL:  47 y.o.-year-old patient lying in the bed with no acute distress.  LUNGS: Normal breath sounds bilaterally, no wheezing, rales,rhonchi or crepitation. No use of accessory muscles of respiration.  CARDIOVASCULAR: S1, S2 normal. No murmurs, rubs, or gallops.  ABDOMEN: Soft, non-tender, non-distended. Bowel sounds present. No organomegaly or mass.  NEUROLOGIC: Moves all 4 extremities. PSYCHIATRIC: The patient is alert and oriented x 3.  SKIN: No obvious rash, lesion, or ulcer.   DATA REVIEW:   CBC No results for input(s): WBC, HGB, HCT, PLT in the last 168 hours.  Chemistries  No results for input(s): NA, K, CL, CO2, GLUCOSE, BUN, CREATININE, CALCIUM, MG, AST, ALT, ALKPHOS, BILITOT in the last 168 hours.  Invalid input(s): GFRCGP  Cardiac Enzymes No results for input(s): TROPONINI in the last 168 hours.  Microbiology Results  Results for orders placed or performed during the hospital encounter of 10/01/17  MRSA PCR Screening     Status: None   Collection Time: 10/02/17  8:16 AM  Result Value Ref Range Status   MRSA by PCR NEGATIVE NEGATIVE Final    Comment:        The GeneXpert MRSA Assay (FDA approved for NASAL specimens only), is one component of a comprehensive MRSA colonization surveillance program. It is not intended to diagnose MRSA infection nor to guide or monitor treatment for MRSA infections. Performed at [facility], [address] [place], [place] [address]     RADIOLOGY:  No results found.  Follow up with PCP in 1 week.  Management plans discussed with the patient, family and they are in agreement.  CODE STATUS:  Code  Status History    Date Active Date Inactive Code Status Order ID Comments User Context   10/02/2017 07:37 10/02/2017 16:59 Full Code [id number]  Gorden Harms, MD Inpatient   08/21/2017 02:08 08/23/2017 16:21 Full Code [id number]  Amelia Jo, MD ED   06/09/2017 20:48 06/13/2017 19:07 Full Code [id number]  Dustin Flock, MD Inpatient   01/06/2017 14:37 01/07/2017 17:44 Full Code [id number]  Epifanio Lesches, MD ED   11/27/2016 14:31 11/30/2016 14:23 Full Code [id number]  Henreitta Leber, MD Inpatient   10/28/2016 16:11 10/31/2016 16:38 Full Code [id number]  Epifanio Lesches, MD ED   10/13/2016 21:36 10/14/2016 21:23 Full Code [id number]  Lance Coon, MD Inpatient   08/28/2016 16:58 08/30/2016 17:19 Full Code [id number]  Fritzi Mandes, MD ED   07/03/2016 20:06 07/06/2016 16:22 Full Code [id number]  Fritzi Mandes, MD ED   04/11/2016 18:57 04/12/2016 07:16 Full Code [id number]  Vaughan Basta, MD Inpatient   01/25/2015 12:29 01/28/2015 16:26 Full Code [id number]  Dustin Flock, MD Inpatient      TOTAL TIME TAKING CARE OF THIS  PATIENT ON DAY OF DISCHARGE: more than 30 minutes.   Neita Carp M.D on 10/13/2017 at 9:17 PM  Between 7am to 6pm - Pager - [phone]  After 6pm go to [email or web address] - password [email or web address] [facility]  Office  [phone]  CC: Primary care physician; [name], MD  Note: This dictation was prepared with Dragon dictation along with smaller phrase technology. Any transcriptional errors that result from this process are unintentional.

## 2018-01-02 ENCOUNTER — Encounter: Payer: Self-pay | Admitting: Emergency Medicine

## 2018-01-02 ENCOUNTER — Emergency Department: Payer: Self-pay

## 2018-01-02 ENCOUNTER — Emergency Department
Admission: EM | Admit: 2018-01-02 | Discharge: 2018-01-02 | Disposition: A | Discharge status: Home / Self Care | Payer: Self-pay | Attending: Emergency Medicine | Admitting: Emergency Medicine

## 2018-01-02 ENCOUNTER — Other Ambulatory Visit: Payer: Self-pay

## 2018-01-02 DIAGNOSIS — Z992 Dependence on renal dialysis: Secondary | ICD-10-CM | POA: Insufficient documentation

## 2018-01-02 DIAGNOSIS — I5042 Chronic combined systolic (congestive) and diastolic (congestive) heart failure: Secondary | ICD-10-CM | POA: Insufficient documentation

## 2018-01-02 DIAGNOSIS — I132 Hypertensive heart and chronic kidney disease with heart failure and with stage 5 chronic kidney disease, or end stage renal disease: Secondary | ICD-10-CM | POA: Insufficient documentation

## 2018-01-02 DIAGNOSIS — M7662 Achilles tendinitis, left leg: Secondary | ICD-10-CM | POA: Insufficient documentation

## 2018-01-02 DIAGNOSIS — F17210 Nicotine dependence, cigarettes, uncomplicated: Secondary | ICD-10-CM | POA: Insufficient documentation

## 2018-01-02 DIAGNOSIS — N186 End stage renal disease: Secondary | ICD-10-CM | POA: Insufficient documentation

## 2018-01-02 LAB — COMPREHENSIVE METABOLIC PANEL
ALT: 18 U/L (ref 17–63)
ANION GAP: 17 — AB (ref 5–15)
AST: 29 U/L (ref 15–41)
Albumin: 3.9 g/dL (ref 3.5–5.0)
Alkaline Phosphatase: 226 U/L — ABNORMAL HIGH (ref 38–126)
BUN: 49 mg/dL — ABNORMAL HIGH (ref 6–20)
CHLORIDE: 94 mmol/L — AB (ref 101–111)
CO2: 25 mmol/L (ref 22–32)
CREATININE: 8.55 mg/dL — AB (ref 0.61–1.24)
Calcium: 9.6 mg/dL (ref 8.9–10.3)
GFR, EST AFRICAN AMERICAN: 8 mL/min — AB (ref >60)
GFR, EST NON AFRICAN AMERICAN: 7 mL/min — AB (ref >60)
Glucose, Bld: 126 mg/dL — ABNORMAL HIGH (ref 65–99)
POTASSIUM: 4.6 mmol/L (ref 3.5–5.1)
SODIUM: 136 mmol/L (ref 135–145)
Total Bilirubin: 1 mg/dL (ref 0.3–1.2)
Total Protein: 7.7 g/dL (ref 6.5–8.1)

## 2018-01-02 LAB — CBC WITH DIFFERENTIAL/PLATELET
Basophils Absolute: 0 10*3/uL (ref 0–0.1)
Basophils Relative: 1 %
EOS ABS: 0.1 10*3/uL (ref 0–0.7)
EOS PCT: 3 %
HCT: 36 % — ABNORMAL LOW (ref 40.0–52.0)
Hemoglobin: 12.1 g/dL — ABNORMAL LOW (ref 13.0–18.0)
LYMPHS PCT: 13 %
Lymphs Abs: 0.7 10*3/uL — ABNORMAL LOW (ref 1.0–3.6)
MCH: 32.8 pg (ref 26.0–34.0)
MCHC: 33.6 g/dL (ref 32.0–36.0)
MCV: 97.5 fL (ref 80.0–100.0)
MONO ABS: 0.5 10*3/uL (ref 0.2–1.0)
Monocytes Relative: 9 %
Neutro Abs: 4.2 10*3/uL (ref 1.4–6.5)
Neutrophils Relative %: 74 %
PLATELETS: 149 10*3/uL — AB (ref 150–440)
RBC: 3.69 MIL/uL — ABNORMAL LOW (ref 4.40–5.90)
RDW: 16.8 % — AB (ref 11.5–14.5)
WBC: 5.6 10*3/uL (ref 3.8–10.6)

## 2018-01-02 MED ORDER — OXYCODONE-ACETAMINOPHEN 5-325 MG PO TABS
2.0000 | ORAL_TABLET | Freq: Once | ORAL | Status: AC
  Administered 2018-01-02: 2 via ORAL
  Filled 2018-01-02: qty 2

## 2018-01-02 MED ORDER — TRAMADOL HCL 50 MG PO TABS: 50.0000 mg | ORAL_TABLET | Freq: Two times a day (BID) | ORAL | 0 refills | Status: AC | PRN

## 2018-01-02 MED ORDER — ONDANSETRON 4 MG PO TBDP
4.0000 mg | ORAL_TABLET | Freq: Once | ORAL | Status: AC
Start: 2018-01-02 — End: 2018-01-02
  Administered 2018-01-02: 4 mg via ORAL
  Filled 2018-01-02: qty 1

## 2018-01-02 NOTE — ED Notes (Signed)
Interpreter paged to triage

## 2018-01-02 NOTE — ED Triage Notes (Addendum)
Pt c/o left upper back pain x 2 weeks; denies injury; tingling "in the bone in my back"; pain constant; also c/o left foot pain x 1 month-points to achilles tendon; pt says he was told by his dialysis MD that the tendon is "starting to rupture"; pain is constant; pt also c/o pain to left groin for 6 months, diagnosed with hernia here; pt here tonight because the pain is not going away; has not followed up with any doctors with any pain, pt says he has no insurance;

## 2018-01-02 NOTE — ED Notes (Signed)
Pt assessed with this RN, Dr Jimmye Norman and interpreter Estill Bamberg at bedside. Pt co pain to his left ankle region for 4 weeks. CMS intact. Pt also co pain to his right groin. Area WDL per MD. Pt also co pain to his left scapula region. No injury. Resp even and unlabored. Lungs clear bilaterally.

## 2018-01-02 NOTE — ED Provider Notes (Signed)
[facility] Emergency Department Provider Note       Time seen: ----------------------------------------- 10:01 PM on 01/02/2018 -----------------------------------------   I have reviewed the triage vital signs and the nursing notes.  HISTORY   Chief Complaint Foot Pain; Back Pain; and Groin Pain    HPI Mario [name] is a 47 y.o. male with a history of chronic systolic and diastolic congestive heart.,  Cirrhosis, end-stage renal disease on dialysis Tuesday, Thursday and Saturday who presents to the ED for mainly left foot pain.  Patient states he did not go to dialysis today due to the severe pain is been having in his foot for the past 4 weeks.  Also complains of pain in his upper back for the past 2 weeks and pain in his left groin for several weeks as well.  He denies fevers or chills, denies chest pain or shortness of breath, denies vomiting or diarrhea.  Past Medical History:  Diagnosis Date  . Abdominal pain 08/28/2016  . Acute hyperkalemia 10/28/2016  . Chest pain 04/11/2016  . Chronic combined systolic and diastolic CHF (congestive heart failure) ([place])   . Cirrhosis ([place])   . Dialysis patient ([place])   . ESRD (end stage renal disease) on dialysis ([place]) 01/25/2015  . Fluid overload 04/12/2016  . HTN (hypertension) 10/13/2016  . Hyperkalemia 11/27/2016  . Hypertension   . Pulmonary edema 07/03/2016  . Renal disorder   . Renal insufficiency   . Scrotal infection 01/25/2015    Patient Active Problem List   Diagnosis Date Noted  . Gastroenteritis 10/02/2017  . Abdominal pain, acute 08/21/2017  . Malnutrition of moderate degree 08/21/2017  . SBP (spontaneous bacterial peritonitis) ([place]) 06/09/2017  . Erroneous encounter - disregard 03/04/2017  . Hyperkalemia 11/27/2016  . Acute hyperkalemia 10/28/2016  . Chronic combined systolic and diastolic CHF (congestive heart failure) ([place]) 10/13/2016  . Cirrhosis ([place]) 10/13/2016  . HTN (hypertension)  10/13/2016  . Abdominal pain 08/28/2016  . Pulmonary edema 07/03/2016  . Fluid overload 04/12/2016  . Chest pain 04/11/2016  . Scrotal infection 01/25/2015  . ESRD (end stage renal disease) on dialysis ([place]) 01/25/2015    Past Surgical History:  Procedure Laterality Date  . AV FISTULA PLACEMENT      Allergies Betadine [povidone iodine]  Social History Social History   Tobacco Use  . Smoking status: Current Every Day Smoker    Packs/day: 0.25    Years: 25.00    Pack years: 6.25    Types: Cigarettes  . Smokeless tobacco: Never Used  Substance Use Topics  . Alcohol use: No    Comment: pt denies  . Drug use: No   Review of Systems Constitutional: Negative for fever. Cardiovascular: Negative for chest pain. Respiratory: Negative for shortness of breath. Gastrointestinal: Negative for abdominal pain, vomiting and diarrhea. Musculoskeletal: Positive for left heel pain Skin: Negative for rash. Neurological: Negative for headaches, focal weakness or numbness.  All systems negative/normal/unremarkable except as stated in the HPI  ____________________________________________   PHYSICAL EXAM:  VITAL SIGNS: ED Triage Vitals  Enc Vitals Group     BP 01/02/18 2118 139/75     Pulse Rate 01/02/18 2118 (!) 101     Resp 01/02/18 2118 17     Temp 01/02/18 2118 98.5 F (36.9 C)     Temp Source 01/02/18 2118 Oral     SpO2 01/02/18 2118 99 %     Weight 01/02/18 2118 136 lb 11 oz (62 kg)     Height  01/02/18 2118 5\' 5"  (1.651 m)     Head Circumference --      Peak Flow --      Pain Score 01/02/18 2127 10     Pain Loc --      Pain Edu? --      Excl. in [place]? --    Constitutional: Alert and oriented.  Chronically ill-appearing, no distress Eyes: Conjunctivae are normal. Normal extraocular movements. Cardiovascular: Normal rate, regular rhythm. No murmurs, rubs, or gallops.  AV fistula in left arm appears to be unremarkable Respiratory: Normal respiratory effort without  tachypnea nor retractions. Breath sounds are clear and equal bilaterally. No wheezes/rales/rhonchi. Gastrointestinal: Soft and nontender. Normal bowel sounds Musculoskeletal: Nontender with normal range of motion in extremities.  Tenderness is noted around his left heel and Achilles tendon.  Negative Thompson test Neurologic:  Normal speech and language. No gross focal neurologic deficits are appreciated.  Skin:  Skin is warm, dry and intact. No rash noted. Psychiatric: Mood and affect are normal. Speech and behavior are normal.  ____________________________________________  ED COURSE:  As part of my medical decision making, I reviewed the following data within the [place] History obtained from family if available, nursing notes, old chart and ekg, as well as notes from prior ED visits. Patient presented for heel pain and weakness with end-stage renal disease on dialysis, we will assess with labs and imaging as indicated at this time.   Procedures ____________________________________________   LABS (pertinent positives/negatives)  Labs Reviewed  CBC WITH DIFFERENTIAL/PLATELET - Abnormal; Notable for the following components:      Result Value   RBC 3.69 (*)    Hemoglobin 12.1 (*)    HCT 36.0 (*)    RDW 16.8 (*)    Platelets 149 (*)    Lymphs Abs 0.7 (*)    All other components within normal limits  COMPREHENSIVE METABOLIC PANEL - Abnormal; Notable for the following components:   Chloride 94 (*)    Glucose, Bld 126 (*)    BUN 49 (*)    Creatinine, Ser 8.55 (*)    Alkaline Phosphatase 226 (*)    GFR calc non Af Amer 7 (*)    GFR calc Af Amer 8 (*)    Anion gap 17 (*)    All other components within normal limits    RADIOLOGY  Left ankle x-ray Did not reveal any acute process ____________________________________________  DIFFERENTIAL DIAGNOSIS   Tendinitis, end-stage renal disease, electrolyte abnormality, chronic pain  FINAL ASSESSMENT AND  PLAN  Achilles tendinitis, end-stage renal disease on dialysis   Plan: The patient had presented for heel pain and groin pain. Patient's labs revealed end-stage renal disease on dialysis with no acute findings. Patient's imaging was unremarkable.  He will be discharged with a short supply pain medicine and is advised to follow-up closely with nephrology for dialysis.   Laurence Aly, MD   Note: This note was generated in part or whole with voice recognition software. Voice recognition is usually quite accurate but there are transcription errors that can and very often do occur. I apologize for any typographical errors that were not detected and corrected.     Earleen Newport, MD 01/02/18 2246

## 2018-01-15 ENCOUNTER — Other Ambulatory Visit: Payer: Self-pay

## 2018-01-15 ENCOUNTER — Emergency Department: Payer: Self-pay

## 2018-01-15 ENCOUNTER — Emergency Department
Admission: EM | Admit: 2018-01-15 | Discharge: 2018-01-15 | Disposition: A | Discharge status: Home / Self Care | Payer: Self-pay | Attending: Emergency Medicine | Admitting: Emergency Medicine

## 2018-01-15 DIAGNOSIS — N186 End stage renal disease: Secondary | ICD-10-CM | POA: Insufficient documentation

## 2018-01-15 DIAGNOSIS — I120 Hypertensive chronic kidney disease with stage 5 chronic kidney disease or end stage renal disease: Secondary | ICD-10-CM | POA: Insufficient documentation

## 2018-01-15 DIAGNOSIS — Y998 Other external cause status: Secondary | ICD-10-CM | POA: Insufficient documentation

## 2018-01-15 DIAGNOSIS — Y939 Activity, unspecified: Secondary | ICD-10-CM | POA: Insufficient documentation

## 2018-01-15 DIAGNOSIS — S9032XA Contusion of left foot, initial encounter: Secondary | ICD-10-CM | POA: Insufficient documentation

## 2018-01-15 DIAGNOSIS — W19XXXA Unspecified fall, initial encounter: Secondary | ICD-10-CM | POA: Insufficient documentation

## 2018-01-15 DIAGNOSIS — Y929 Unspecified place or not applicable: Secondary | ICD-10-CM | POA: Insufficient documentation

## 2018-01-15 LAB — ETHANOL

## 2018-01-15 MED ORDER — FENTANYL CITRATE (PF) 100 MCG/2ML IJ SOLN
INTRAMUSCULAR | Status: AC
  Filled 2018-01-15: qty 2

## 2018-01-15 MED ORDER — OXYCODONE-ACETAMINOPHEN 5-325 MG PO TABS
1.0000 | ORAL_TABLET | Freq: Once | ORAL | Status: DC
  Filled 2018-01-15: qty 1

## 2018-01-15 MED ORDER — FENTANYL CITRATE (PF) 100 MCG/2ML IJ SOLN: 50.0000 ug | Freq: Once | INTRAMUSCULAR | Status: DC

## 2018-01-15 NOTE — ED Triage Notes (Signed)
Pt presents to ED with right foot pain after falling earlier today. Pt denies any other pain. Tearful in triage.

## 2018-01-15 NOTE — ED Notes (Signed)
Pt wailing during assessment, but now calm.  Interpreter Juliann Pulse used via [employer].  Pt states that he fell today on pavement and that his L foot hurts.  Swelling noted to L foot.  Dr. Owens Shark informed patient the xray was negative.  Pt continued to cry out in pain at that time.  Once MD out of room, pt stopped crying and appears comfortable.  Unable to obtain IV.  Attempted x5.  MD aware, states to straight stick pt for blood at this time and to hold fentanyl.

## 2018-01-15 NOTE — ED Notes (Signed)
Pt denies needing instructions on crutches at this time.  Ankle stirrup brace applied by this RN.  Pt denies having a ride home at this time.  Pt instructed to call for ride.  Pt educated that pain medication could not be given until ride home confirmed.

## 2018-01-15 NOTE — ED Provider Notes (Signed)
[facility] Emergency Department Provider Note   First MD Initiated Contact with Patient 01/15/18 0225     (approximate)  I have reviewed the triage vital signs and the nursing notes.   HISTORY  Chief Complaint Fall and Foot Pain   HPI Mario [name] is a 47 y.o. male presents to the emergency department with history of accidental left foot injury today.  Patient states while he was walking he accidentally struck his foot and currently has 10 out of 10 left foot pain.  Patient states that pain is worse with ambulation.   Past medical history End-stage renal disease Cirrhosis of the liver Hypertension There are no active problems to display for this patient.     Prior to Admission medications   Not on File    Allergies No known drug allergies No family history on file.  Social History Social History   Tobacco Use  . Smoking status: Not on file  Substance Use Topics  . Alcohol use: Not on file  . Drug use: Not on file    Review of Systems Constitutional: No fever/chills Eyes: No visual changes. ENT: No sore throat. Cardiovascular: Denies chest pain. Respiratory: Denies shortness of breath. Gastrointestinal: No abdominal pain.  No nausea, no vomiting.  No diarrhea.  No constipation. Genitourinary: Negative for dysuria. Musculoskeletal: Negative for neck pain.  Negative for back pain.  Positive for left foot pain Integumentary: Negative for rash. Neurological: Negative for headaches, focal weakness or numbness.   ____________________________________________   PHYSICAL EXAM:  VITAL SIGNS: ED Triage Vitals  Enc Vitals Group     BP 01/15/18 0107 (!) 194/95     Pulse Rate 01/15/18 0107 68     Resp 01/15/18 0107 20     Temp 01/15/18 0107 98.4 F (36.9 C)     Temp Source 01/15/18 0107 Oral     SpO2 01/15/18 0107 100 %     Weight 01/15/18 0108 70 kg (154 lb 5.2 oz)     Height --      Head Circumference --      Peak Flow --    Pain Score 01/15/18 0107 10     Pain Loc --      Pain Edu? --      Excl. in [place]? --     Constitutional: Alert and oriented.  Bizarre affect eyes: Conjunctivae are normal. Head: Atraumatic. Mouth/Throat: Mucous membranes are moist.  Oropharynx non-erythematous. Neck: No stridor.   Cardiovascular: Normal rate, regular rhythm. Good peripheral circulation. Grossly normal heart sounds. Respiratory: Normal respiratory effort.  No retractions. Lungs CTAB. Gastrointestinal: Soft and nontender. No distention.  Musculoskeletal: Pain with palpation of the dorsal aspect of the left foot pain with active and passive range of motion.  No gross deformities of extremities. Neurologic:  Normal speech and language. No gross focal neurologic deficits are appreciated.  Skin:  Skin is warm, dry and intact. No rash noted. Psychiatric: Mood and affect are normal. Speech and behavior are normal.  ____________________________________________     RADIOLOGY I, [place] Kinzleigh Kandler, personally viewed and evaluated these images (plain radiographs) as part of my medical decision making, as well as reviewing the written report by the radiologist.  ED MD interpretation: Remote fracture of the fifth metatarsal otherwise no acute fracture noted per the radiologist.  Official radiology report(s): Dg Foot Complete Left  Result Date: 01/15/2018 CLINICAL DATA:  Right foot pain after fall earlier today. EXAM: LEFT FOOT - COMPLETE 3+ VIEW COMPARISON:  None. FINDINGS: Deformity of the distal fifth metatarsal without visualized fracture lucency, suggesting remote prior fracture. No evidence of acute foot fracture. The alignment and joint spaces are maintained. The bones are under mineralized. There is dorsal soft tissue edema. Small tibial talar joint effusion. IMPRESSION: Distal fifth metatarsal deformity is consistent with remote prior injury. No evidence of acute foot fracture. Dorsal soft tissue edema. Electronically Signed    By: Jeb Levering M.D.   On: 01/15/2018 01:35    ____________________________________________  :   Procedures   ____________________________________________   INITIAL IMPRESSION / ASSESSMENT AND PLAN / ED COURSE  As part of my medical decision making, I reviewed the following data within the electronic MEDICAL RECORD NUMBER   47 year old male presented with above-stated history and physical exam secondary to left foot injury.  X-ray revealed no evidence of acute fracture or dislocation.  Shortly after arriving to the room the patient went to sleep and is in no apparent distress.  Ice pack was applied to the foot.  Patient states that foot pain is worse with ambulation and as such crutches given ________  FINAL CLINICAL IMPRESSION(S) / ED DIAGNOSES  Final diagnoses:  Contusion of left foot, initial encounter     MEDICATIONS GIVEN DURING THIS VISIT:  Medications - No data to display   ED Discharge Orders    None       Note:  This document was prepared using Dragon voice recognition software and may include unintentional dictation errors.    Gregor Hams, MD 01/15/18 [phone]

## 2018-01-22 ENCOUNTER — Emergency Department
Admission: EM | Admit: 2018-01-22 | Discharge: 2018-01-22 | Disposition: A | Discharge status: Home / Self Care | Payer: Self-pay | Attending: Emergency Medicine | Admitting: Emergency Medicine

## 2018-01-22 ENCOUNTER — Emergency Department: Payer: Self-pay

## 2018-01-22 ENCOUNTER — Encounter: Payer: Self-pay | Admitting: Emergency Medicine

## 2018-01-22 ENCOUNTER — Other Ambulatory Visit: Payer: Self-pay

## 2018-01-22 DIAGNOSIS — Y939 Activity, unspecified: Secondary | ICD-10-CM | POA: Insufficient documentation

## 2018-01-22 DIAGNOSIS — I5042 Chronic combined systolic (congestive) and diastolic (congestive) heart failure: Secondary | ICD-10-CM | POA: Insufficient documentation

## 2018-01-22 DIAGNOSIS — S92135A Nondisplaced fracture of posterior process of left talus, initial encounter for closed fracture: Secondary | ICD-10-CM | POA: Insufficient documentation

## 2018-01-22 DIAGNOSIS — Z992 Dependence on renal dialysis: Secondary | ICD-10-CM | POA: Insufficient documentation

## 2018-01-22 DIAGNOSIS — N186 End stage renal disease: Secondary | ICD-10-CM | POA: Insufficient documentation

## 2018-01-22 DIAGNOSIS — F17210 Nicotine dependence, cigarettes, uncomplicated: Secondary | ICD-10-CM | POA: Insufficient documentation

## 2018-01-22 DIAGNOSIS — Y999 Unspecified external cause status: Secondary | ICD-10-CM | POA: Insufficient documentation

## 2018-01-22 DIAGNOSIS — X58XXXA Exposure to other specified factors, initial encounter: Secondary | ICD-10-CM | POA: Insufficient documentation

## 2018-01-22 DIAGNOSIS — I132 Hypertensive heart and chronic kidney disease with heart failure and with stage 5 chronic kidney disease, or end stage renal disease: Secondary | ICD-10-CM | POA: Insufficient documentation

## 2018-01-22 DIAGNOSIS — Z79899 Other long term (current) drug therapy: Secondary | ICD-10-CM | POA: Insufficient documentation

## 2018-01-22 DIAGNOSIS — Y929 Unspecified place or not applicable: Secondary | ICD-10-CM | POA: Insufficient documentation

## 2018-01-22 LAB — COMPREHENSIVE METABOLIC PANEL
ALBUMIN: 3.8 g/dL (ref 3.5–5.0)
ALT: 18 U/L (ref 17–63)
AST: 29 U/L (ref 15–41)
Alkaline Phosphatase: 166 U/L — ABNORMAL HIGH (ref 38–126)
Anion gap: 13 (ref 5–15)
BUN: 23 mg/dL — AB (ref 6–20)
CHLORIDE: 94 mmol/L — AB (ref 101–111)
CO2: 26 mmol/L (ref 22–32)
CREATININE: 6.1 mg/dL — AB (ref 0.61–1.24)
Calcium: 9.7 mg/dL (ref 8.9–10.3)
GFR calc non Af Amer: 10 mL/min — ABNORMAL LOW (ref >60)
GFR, EST AFRICAN AMERICAN: 11 mL/min — AB (ref >60)
Glucose, Bld: 117 mg/dL — ABNORMAL HIGH (ref 65–99)
Potassium: 4.7 mmol/L (ref 3.5–5.1)
SODIUM: 133 mmol/L — AB (ref 135–145)
Total Bilirubin: 1.3 mg/dL — ABNORMAL HIGH (ref 0.3–1.2)
Total Protein: 7.5 g/dL (ref 6.5–8.1)

## 2018-01-22 LAB — PROTIME-INR
INR: 1.17
PROTHROMBIN TIME: 14.8 s (ref 11.4–15.2)

## 2018-01-22 LAB — CBC
HCT: 39 % — ABNORMAL LOW (ref 40.0–52.0)
Hemoglobin: 13 g/dL (ref 13.0–18.0)
MCH: 32.7 pg (ref 26.0–34.0)
MCHC: 33.3 g/dL (ref 32.0–36.0)
MCV: 98.2 fL (ref 80.0–100.0)
PLATELETS: 121 10*3/uL — AB (ref 150–440)
RBC: 3.97 MIL/uL — AB (ref 4.40–5.90)
RDW: 16.5 % — ABNORMAL HIGH (ref 11.5–14.5)
WBC: 4.8 10*3/uL (ref 3.8–10.6)

## 2018-01-22 LAB — APTT: aPTT: 39 seconds — ABNORMAL HIGH (ref 24–36)

## 2018-01-22 MED ORDER — HYDROMORPHONE HCL 1 MG/ML IJ SOLN
1.0000 mg | Freq: Once | INTRAMUSCULAR | Status: AC
  Administered 2018-01-22: 1 mg via INTRAVENOUS

## 2018-01-22 MED ORDER — KETOROLAC TROMETHAMINE 30 MG/ML IJ SOLN
30.0000 mg | Freq: Once | INTRAMUSCULAR | Status: AC
  Administered 2018-01-22: 30 mg via INTRAMUSCULAR
  Filled 2018-01-22: qty 1

## 2018-01-22 MED ORDER — MORPHINE SULFATE (PF) 4 MG/ML IV SOLN
4.0000 mg | Freq: Once | INTRAVENOUS | Status: AC
  Administered 2018-01-22: 4 mg via INTRAMUSCULAR
  Filled 2018-01-22: qty 1

## 2018-01-22 MED ORDER — OXYCODONE-ACETAMINOPHEN 5-325 MG PO TABS: 1.0000 | ORAL_TABLET | ORAL | 0 refills | Status: DC | PRN

## 2018-01-22 MED ORDER — HYDROMORPHONE HCL 1 MG/ML IJ SOLN
INTRAMUSCULAR | Status: AC
  Filled 2018-01-22: qty 1

## 2018-01-22 NOTE — ED Notes (Signed)
EDP aware that pt continues to yell out in pain.

## 2018-01-22 NOTE — ED Notes (Signed)
EDP at bedside to re-assess patient.  

## 2018-01-22 NOTE — ED Notes (Signed)
Pt requested interpreter. Request placed.

## 2018-01-22 NOTE — ED Notes (Addendum)
Pt alert and oriented X4, active, cooperative, pt in NAD. RR even and unlabored, color WNL.  Pt informed to return if any life threatening symptoms occur.  Discharge and followup instructions reviewed. Verified that friend picking up patient. [employer] interpreter used for discharge.  EDP aware of patient most recent VS.   Left with all of belongings including crutches. EDP verified correct splint application prior to discharge.

## 2018-01-22 NOTE — ED Triage Notes (Signed)
Pt to triage via w/c, wailing & moaning; st "mucho pain"; swelling to left foot; pt ambulated into lobby with aid of crutches; seen recently for same; velcro splint in place

## 2018-01-22 NOTE — ED Provider Notes (Signed)
[facility] Emergency Department Provider Note   ____________________________________________    I have reviewed the triage vital signs and the nursing notes.   HISTORY  Chief Complaint Foot Pain  Spanish interpreter   HPI Mario [name] is a 47 y.o. male with significant pmh as noted below who presents with left foot pain. Mario [name] complains of moderate pain to the dorsum of the left foot. Has not taken anything for this. Review of medical records demonstrates Mario [name] has been seen for this several times in the past. Negative xray 1 week ago. Reports compliance with dialysis.gradual onset pain   Past Medical History:  Diagnosis Date  . Abdominal pain 08/28/2016  . Acute hyperkalemia 10/28/2016  . Chest pain 04/11/2016  . Chronic combined systolic and diastolic CHF (congestive heart failure) ([place])   . Cirrhosis ([place])   . Dialysis Mario [name] ([place])   . ESRD (end stage renal disease) on dialysis ([place]) 01/25/2015  . Fluid overload 04/12/2016  . HTN (hypertension) 10/13/2016  . Hyperkalemia 11/27/2016  . Hypertension   . Pulmonary edema 07/03/2016  . Renal disorder   . Renal insufficiency   . Scrotal infection 01/25/2015    Mario [name] Active Problem List   Diagnosis Date Noted  . Gastroenteritis 10/02/2017  . Abdominal pain, acute 08/21/2017  . Malnutrition of moderate degree 08/21/2017  . SBP (spontaneous bacterial peritonitis) ([place]) 06/09/2017  . Erroneous encounter - disregard 03/04/2017  . Hyperkalemia 11/27/2016  . Acute hyperkalemia 10/28/2016  . Chronic combined systolic and diastolic CHF (congestive heart failure) ([place]) 10/13/2016  . Cirrhosis ([place]) 10/13/2016  . HTN (hypertension) 10/13/2016  . Abdominal pain 08/28/2016  . Pulmonary edema 07/03/2016  . Fluid overload 04/12/2016  . Chest pain 04/11/2016  . Scrotal infection 01/25/2015  . ESRD (end stage renal disease) on dialysis ([place]) 01/25/2015    Past Surgical History:    Procedure Laterality Date  . AV FISTULA PLACEMENT      Prior to Admission medications   Medication Sig Start Date End Date Taking? Authorizing Provider  albuterol (PROVENTIL HFA;VENTOLIN HFA) 108 (90 Base) MCG/ACT inhaler Inhale 2 puffs into the lungs every 6 (six) hours as needed for wheezing or shortness of breath. 10/02/17   Hillary Bow, MD  ferric citrate (AURYXIA) 1 GM 210 MG(Fe) tablet Take 420 mg by mouth 3 (three) times daily with meals.    [provider]  oxyCODONE-acetaminophen (PERCOCET) 5-325 MG tablet Take 1 tablet by mouth every 4 (four) hours as needed for severe pain. 01/22/18 01/22/19  Lavonia Drafts, MD     Allergies Betadine [povidone iodine]  Family History  Problem Relation Age of Onset  . Kidney failure Father   . Diabetes Mother     Social History Social History   Tobacco Use  . Smoking status: Current Every Day Smoker    Packs/day: 0.25    Years: 25.00    Pack years: 6.25    Types: Cigarettes  . Smokeless tobacco: Never Used  Substance Use Topics  . Alcohol use: No    Comment: pt denies  . Drug use: No    Review of Systems  Constitutional: No fever/chills Eyes: No visual changes.  ENT: No neck pain Cardiovascular: Denies chest pain. Respiratory: Denies shortness of breath. Gastrointestinal: No abdominal pain.  Genitourinary: esrd Musculoskeletal: Foot pain as above Skin: mild redness to top of left foot Neurological: Negative for weakness   ____________________________________________   PHYSICAL EXAM:  VITAL SIGNS: ED Triage Vitals  Enc Vitals  Group     BP 01/22/18 0655 (!) 195/91     Pulse Rate 01/22/18 0655 69     Resp 01/22/18 0655 20     Temp 01/22/18 0655 (!) 97.5 F (36.4 C)     Temp Source 01/22/18 0655 Oral     SpO2 01/22/18 0655 100 %     Weight 01/22/18 0650 61.7 kg (136 lb)     Height 01/22/18 0650 1.651 m (5\' 5" )     Head Circumference --      Peak Flow --      Pain Score 01/22/18 0650 10     Pain Loc  --      Pain Edu? --      Excl. in [place]? --     Constitutional: Alert and oriented. Tearful with unusual affect Eyes: Conjunctivae are normal.  Head: Atraumatic.  Mouth/Throat: Mucous membranes are moist.    Cardiovascular: Normal rate, regular rhythm. Grossly normal heart sounds.  Good peripheral circulation. Respiratory: Normal respiratory effort.  No retractions.  Gastrointestinal: Soft and nontender. No distention.    Musculoskeletal: Left arm AV graft, +thrill. Left foot:  Warm and well perfused, normal pulses. Mild swelling midfoot with minimal erythema. No skin break or abscess. No bony abnormalities. Neurologic:  Normal speech and language. No gross focal neurologic deficits are appreciated.  Skin:  Skin is warm, dry and intact.    ____________________________________________   LABS (all labs ordered are listed, but only abnormal results are displayed)  Labs Reviewed  CBC - Abnormal; Notable for the following components:      Result Value   RBC 3.97 (*)    HCT 39.0 (*)    RDW 16.5 (*)    Platelets 121 (*)    All other components within normal limits  COMPREHENSIVE METABOLIC PANEL - Abnormal; Notable for the following components:   Sodium 133 (*)    Chloride 94 (*)    Glucose, Bld 117 (*)    BUN 23 (*)    Creatinine, Ser 6.10 (*)    Alkaline Phosphatase 166 (*)    Total Bilirubin 1.3 (*)    GFR calc non Af Amer 10 (*)    GFR calc Af Amer 11 (*)    All other components within normal limits  APTT - Abnormal; Notable for the following components:   aPTT 39 (*)    All other components within normal limits  PROTIME-INR   ____________________________________________  EKG   ____________________________________________  RADIOLOGY  Ct foot demonstrates talus fracture ____________________________________________   PROCEDURES  Procedure(s) performed: yes  .Splint Application Date/Time: 2/69/4854 2:51 PM Performed by: Lavonia Drafts, MD Authorized by:  Lavonia Drafts, MD   Consent:    Consent obtained:  Verbal   Consent given by:  Mario [name]   Risks discussed:  Numbness, pain and swelling   Alternatives discussed:  No treatment Pre-procedure details:    Sensation:  Normal Procedure details:    Laterality:  Left   Location:  Foot   Foot:  L foot   Splint type:  Short leg   Supplies:  Ortho-Glass and elastic bandage Post-procedure details:    Pain:  Unchanged   Sensation:  Normal   Mario [name] tolerance of procedure:  Tolerated well, no immediate complications     Critical Care performed: No ____________________________________________   INITIAL IMPRESSION / ASSESSMENT AND PLAN / ED COURSE  Pertinent labs & imaging results that were available during my care of the Mario [name] were reviewed by me and considered in  my medical decision making (see chart for details).  Mario [name] presents with pain to the left foot that is significant.  He reports he injured it recently but was here and had a negative x-ray.  There is some swelling at the dorsal midfoot, however the foot is warm and well-perfused, not consistent with peripheral vascular disease.  Mario [name] treated with IM morphine with little improvement, IM Toradol and then finally IV Dilaudid which did help his pain.  Suspicious for gout or possible missed bony injury, will send for CT of the foot  CT demonstrates talus fracture, missed on prior x-ray, Mario [name] splinted, will provide pain Rx and follow-up with podiatry    ____________________________________________   FINAL CLINICAL IMPRESSION(S) / ED DIAGNOSES  Final diagnoses:  Closed nondisplaced fracture of posterior process of left talus, initial encounter        Note:  This document was prepared using Dragon voice recognition software and may include unintentional dictation errors.   Lavonia Drafts, MD 01/22/18 1452

## 2018-03-22 ENCOUNTER — Emergency Department
Admission: EM | Admit: 2018-03-22 | Discharge: 2018-03-23 | Disposition: A | Discharge status: Home / Self Care | Payer: Self-pay | Attending: Emergency Medicine | Admitting: Emergency Medicine

## 2018-03-22 ENCOUNTER — Emergency Department: Payer: Self-pay

## 2018-03-22 ENCOUNTER — Encounter: Payer: Self-pay | Admitting: Emergency Medicine

## 2018-03-22 DIAGNOSIS — W19XXXA Unspecified fall, initial encounter: Secondary | ICD-10-CM

## 2018-03-22 DIAGNOSIS — I132 Hypertensive heart and chronic kidney disease with heart failure and with stage 5 chronic kidney disease, or end stage renal disease: Secondary | ICD-10-CM | POA: Insufficient documentation

## 2018-03-22 DIAGNOSIS — F17210 Nicotine dependence, cigarettes, uncomplicated: Secondary | ICD-10-CM | POA: Insufficient documentation

## 2018-03-22 DIAGNOSIS — M79602 Pain in left arm: Secondary | ICD-10-CM

## 2018-03-22 DIAGNOSIS — Y929 Unspecified place or not applicable: Secondary | ICD-10-CM | POA: Insufficient documentation

## 2018-03-22 DIAGNOSIS — Y9355 Activity, bike riding: Secondary | ICD-10-CM | POA: Insufficient documentation

## 2018-03-22 DIAGNOSIS — I5042 Chronic combined systolic (congestive) and diastolic (congestive) heart failure: Secondary | ICD-10-CM | POA: Insufficient documentation

## 2018-03-22 DIAGNOSIS — T148XXA Other injury of unspecified body region, initial encounter: Secondary | ICD-10-CM

## 2018-03-22 DIAGNOSIS — M799 Soft tissue disorder, unspecified: Secondary | ICD-10-CM | POA: Insufficient documentation

## 2018-03-22 DIAGNOSIS — Y999 Unspecified external cause status: Secondary | ICD-10-CM | POA: Insufficient documentation

## 2018-03-22 DIAGNOSIS — Z992 Dependence on renal dialysis: Secondary | ICD-10-CM | POA: Insufficient documentation

## 2018-03-22 DIAGNOSIS — N186 End stage renal disease: Secondary | ICD-10-CM | POA: Insufficient documentation

## 2018-03-22 DIAGNOSIS — M7918 Myalgia, other site: Secondary | ICD-10-CM

## 2018-03-22 LAB — CBC
HCT: 37.2 % — ABNORMAL LOW (ref 40.0–52.0)
Hemoglobin: 12.4 g/dL — ABNORMAL LOW (ref 13.0–18.0)
MCH: 32.7 pg (ref 26.0–34.0)
MCHC: 33.3 g/dL (ref 32.0–36.0)
MCV: 97.9 fL (ref 80.0–100.0)
Platelets: 144 10*3/uL — ABNORMAL LOW (ref 150–440)
RBC: 3.8 MIL/uL — ABNORMAL LOW (ref 4.40–5.90)
RDW: 15.4 % — AB (ref 11.5–14.5)
WBC: 5.6 10*3/uL (ref 3.8–10.6)

## 2018-03-22 MED ORDER — ONDANSETRON HCL 4 MG/2ML IJ SOLN
INTRAMUSCULAR | Status: AC
  Administered 2018-03-22: 4 mg via INTRAVENOUS
  Filled 2018-03-22: qty 2

## 2018-03-22 MED ORDER — MORPHINE SULFATE (PF) 4 MG/ML IV SOLN
4.0000 mg | Freq: Once | INTRAVENOUS | Status: AC
  Administered 2018-03-22: 4 mg via INTRAVENOUS

## 2018-03-22 MED ORDER — ONDANSETRON HCL 4 MG/2ML IJ SOLN
4.0000 mg | Freq: Once | INTRAMUSCULAR | Status: AC
  Administered 2018-03-22: 4 mg via INTRAVENOUS

## 2018-03-22 MED ORDER — MORPHINE SULFATE (PF) 4 MG/ML IV SOLN
INTRAVENOUS | Status: AC
  Filled 2018-03-22: qty 1
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
## 2018-03-23 ENCOUNTER — Emergency Department: Payer: Self-pay

## 2018-03-23 LAB — BASIC METABOLIC PANEL
Anion gap: 12 (ref 5–15)
BUN: 51 mg/dL — AB (ref 6–20)
CALCIUM: 9.1 mg/dL (ref 8.9–10.3)
CO2: 27 mmol/L (ref 22–32)
CREATININE: 9.81 mg/dL — AB (ref 0.61–1.24)
Chloride: 97 mmol/L — ABNORMAL LOW (ref 98–111)
GFR calc Af Amer: 6 mL/min — ABNORMAL LOW (ref >60)
GFR calc non Af Amer: 6 mL/min — ABNORMAL LOW (ref >60)
Glucose, Bld: 108 mg/dL — ABNORMAL HIGH (ref 70–99)
Potassium: 5.1 mmol/L (ref 3.5–5.1)
Sodium: 136 mmol/L (ref 135–145)

## 2018-03-23 LAB — TROPONIN I: Troponin I: 0.03 ng/mL (ref <0.03)

## 2018-03-23 MED ORDER — MORPHINE SULFATE (PF) 4 MG/ML IV SOLN
INTRAVENOUS | Status: AC
  Filled 2018-03-23: qty 1

## 2018-03-23 MED ORDER — MORPHINE SULFATE (PF) 4 MG/ML IV SOLN
4.0000 mg | Freq: Once | INTRAVENOUS | Status: AC
  Administered 2018-03-23: 4 mg via INTRAVENOUS

## 2018-03-23 NOTE — ED Notes (Signed)
Pt screaming/moaning out in pain.  EDP notified; see new orders.

## 2018-03-23 NOTE — ED Notes (Signed)
Pt discharged to home.  [employer] voucher provided by Romie Minus, RN.  [employer] called.  Pt to be taken to dialysis. Discharge instructions reviewed.  Verbalized understanding.  No questions or concerns at this time.  Teach back verified.  Pt in NAD.  No items left in ED.
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
## 2018-05-01 ENCOUNTER — Inpatient Hospital Stay
Admission: EM | Admit: 2018-05-01 | Discharge: 2018-05-04 | DRG: 371 | Disposition: A | Discharge status: Home / Self Care | Payer: Self-pay | Attending: Internal Medicine | Admitting: Internal Medicine

## 2018-05-01 ENCOUNTER — Emergency Department: Payer: Self-pay

## 2018-05-01 ENCOUNTER — Other Ambulatory Visit: Payer: Self-pay

## 2018-05-01 DIAGNOSIS — K652 Spontaneous bacterial peritonitis: Principal | ICD-10-CM | POA: Diagnosis present

## 2018-05-01 DIAGNOSIS — R531 Weakness: Secondary | ICD-10-CM | POA: Diagnosis present

## 2018-05-01 DIAGNOSIS — R1010 Upper abdominal pain, unspecified: Secondary | ICD-10-CM

## 2018-05-01 DIAGNOSIS — R739 Hyperglycemia, unspecified: Secondary | ICD-10-CM | POA: Diagnosis present

## 2018-05-01 DIAGNOSIS — K7031 Alcoholic cirrhosis of liver with ascites: Secondary | ICD-10-CM | POA: Diagnosis present

## 2018-05-01 DIAGNOSIS — Z79899 Other long term (current) drug therapy: Secondary | ICD-10-CM

## 2018-05-01 DIAGNOSIS — R062 Wheezing: Secondary | ICD-10-CM

## 2018-05-01 DIAGNOSIS — N186 End stage renal disease: Secondary | ICD-10-CM | POA: Diagnosis present

## 2018-05-01 DIAGNOSIS — J810 Acute pulmonary edema: Secondary | ICD-10-CM

## 2018-05-01 DIAGNOSIS — Z992 Dependence on renal dialysis: Secondary | ICD-10-CM

## 2018-05-01 DIAGNOSIS — R1110 Vomiting, unspecified: Secondary | ICD-10-CM

## 2018-05-01 DIAGNOSIS — F17210 Nicotine dependence, cigarettes, uncomplicated: Secondary | ICD-10-CM | POA: Diagnosis present

## 2018-05-01 DIAGNOSIS — K2900 Acute gastritis without bleeding: Secondary | ICD-10-CM

## 2018-05-01 DIAGNOSIS — Z833 Family history of diabetes mellitus: Secondary | ICD-10-CM

## 2018-05-01 DIAGNOSIS — D631 Anemia in chronic kidney disease: Secondary | ICD-10-CM | POA: Diagnosis present

## 2018-05-01 DIAGNOSIS — N2581 Secondary hyperparathyroidism of renal origin: Secondary | ICD-10-CM | POA: Diagnosis present

## 2018-05-01 DIAGNOSIS — R1013 Epigastric pain: Secondary | ICD-10-CM

## 2018-05-01 DIAGNOSIS — I8500 Esophageal varices without bleeding: Secondary | ICD-10-CM | POA: Diagnosis present

## 2018-05-01 DIAGNOSIS — R109 Unspecified abdominal pain: Secondary | ICD-10-CM | POA: Diagnosis present

## 2018-05-01 DIAGNOSIS — Z883 Allergy status to other anti-infective agents status: Secondary | ICD-10-CM

## 2018-05-01 DIAGNOSIS — K766 Portal hypertension: Secondary | ICD-10-CM | POA: Diagnosis present

## 2018-05-01 DIAGNOSIS — Z5309 Procedure and treatment not carried out because of other contraindication: Secondary | ICD-10-CM | POA: Diagnosis not present

## 2018-05-01 DIAGNOSIS — E8770 Fluid overload, unspecified: Secondary | ICD-10-CM | POA: Diagnosis present

## 2018-05-01 DIAGNOSIS — K1120 Sialoadenitis, unspecified: Secondary | ICD-10-CM | POA: Diagnosis present

## 2018-05-01 DIAGNOSIS — Z23 Encounter for immunization: Secondary | ICD-10-CM

## 2018-05-01 DIAGNOSIS — R188 Other ascites: Secondary | ICD-10-CM

## 2018-05-01 DIAGNOSIS — L989 Disorder of the skin and subcutaneous tissue, unspecified: Secondary | ICD-10-CM | POA: Diagnosis present

## 2018-05-01 DIAGNOSIS — R609 Edema, unspecified: Secondary | ICD-10-CM

## 2018-05-01 DIAGNOSIS — I132 Hypertensive heart and chronic kidney disease with heart failure and with stage 5 chronic kidney disease, or end stage renal disease: Secondary | ICD-10-CM | POA: Diagnosis present

## 2018-05-01 DIAGNOSIS — Z8659 Personal history of other mental and behavioral disorders: Secondary | ICD-10-CM

## 2018-05-01 DIAGNOSIS — I5042 Chronic combined systolic (congestive) and diastolic (congestive) heart failure: Secondary | ICD-10-CM | POA: Diagnosis present

## 2018-05-01 LAB — CBC WITH DIFFERENTIAL/PLATELET
Basophils Absolute: 0 10*3/uL (ref 0–0.1)
Basophils Relative: 1 %
Eosinophils Absolute: 0.2 10*3/uL (ref 0–0.7)
Eosinophils Relative: 3 %
HCT: 35.5 % — ABNORMAL LOW (ref 40.0–52.0)
HEMOGLOBIN: 12.2 g/dL — AB (ref 13.0–18.0)
LYMPHS ABS: 0.5 10*3/uL — AB (ref 1.0–3.6)
LYMPHS PCT: 9 %
MCH: 34 pg (ref 26.0–34.0)
MCHC: 34.5 g/dL (ref 32.0–36.0)
MCV: 98.7 fL (ref 80.0–100.0)
Monocytes Absolute: 0.5 10*3/uL (ref 0.2–1.0)
Monocytes Relative: 10 %
NEUTROS PCT: 77 %
Neutro Abs: 4.1 10*3/uL (ref 1.4–6.5)
Platelets: 118 10*3/uL — ABNORMAL LOW (ref 150–440)
RBC: 3.6 MIL/uL — AB (ref 4.40–5.90)
RDW: 15.2 % — ABNORMAL HIGH (ref 11.5–14.5)
WBC: 5.3 10*3/uL (ref 3.8–10.6)

## 2018-05-01 LAB — LIPASE, BLOOD: Lipase: 41 U/L (ref 11–51)

## 2018-05-01 LAB — COMPREHENSIVE METABOLIC PANEL
ALT: 16 U/L (ref 0–44)
AST: 28 U/L (ref 15–41)
Albumin: 3.7 g/dL (ref 3.5–5.0)
Alkaline Phosphatase: 149 U/L — ABNORMAL HIGH (ref 38–126)
Anion gap: 15 (ref 5–15)
BUN: 41 mg/dL — AB (ref 6–20)
CO2: 26 mmol/L (ref 22–32)
Calcium: 9.1 mg/dL (ref 8.9–10.3)
Chloride: 91 mmol/L — ABNORMAL LOW (ref 98–111)
Creatinine, Ser: 7.95 mg/dL — ABNORMAL HIGH (ref 0.61–1.24)
GFR, EST AFRICAN AMERICAN: 8 mL/min — AB (ref >60)
GFR, EST NON AFRICAN AMERICAN: 7 mL/min — AB (ref >60)
Glucose, Bld: 123 mg/dL — ABNORMAL HIGH (ref 70–99)
POTASSIUM: 4.9 mmol/L (ref 3.5–5.1)
SODIUM: 132 mmol/L — AB (ref 135–145)
Total Bilirubin: 1.3 mg/dL — ABNORMAL HIGH (ref 0.3–1.2)
Total Protein: 7.4 g/dL (ref 6.5–8.1)

## 2018-05-01 LAB — TROPONIN I
TROPONIN I: 0.03 ng/mL — AB (ref <0.03)
Troponin I: 0.03 ng/mL (ref <0.03)

## 2018-05-01 LAB — INFLUENZA PANEL BY PCR (TYPE A & B)
Influenza A By PCR: NEGATIVE
Influenza B By PCR: NEGATIVE

## 2018-05-01 LAB — GROUP A STREP BY PCR: Group A Strep by PCR: NOT DETECTED

## 2018-05-01 LAB — MONONUCLEOSIS SCREEN: Mono Screen: NEGATIVE

## 2018-05-01 LAB — PHOSPHORUS: Phosphorus: 6.8 mg/dL — ABNORMAL HIGH (ref 2.5–4.6)

## 2018-05-01 MED ORDER — GUAIFENESIN-DM 100-10 MG/5ML PO SYRP: 5.0000 mL | ORAL_SOLUTION | ORAL | Status: DC | PRN

## 2018-05-01 MED ORDER — HEPARIN SODIUM (PORCINE) 1000 UNIT/ML DIALYSIS
20.0000 [IU]/kg | INTRAMUSCULAR | Status: DC | PRN
  Filled 2018-05-01: qty 2

## 2018-05-01 MED ORDER — IOHEXOL 300 MG/ML  SOLN
75.0000 mL | Freq: Once | INTRAMUSCULAR | Status: AC | PRN
  Administered 2018-05-01: 75 mL via INTRAVENOUS

## 2018-05-01 MED ORDER — MORPHINE SULFATE (PF) 4 MG/ML IV SOLN
4.0000 mg | Freq: Once | INTRAVENOUS | Status: AC
  Administered 2018-05-01: 4 mg via INTRAVENOUS
  Filled 2018-05-01: qty 1

## 2018-05-01 MED ORDER — SODIUM CHLORIDE 0.9 % IV BOLUS
500.0000 mL | Freq: Once | INTRAVENOUS | Status: AC
  Administered 2018-05-01: 500 mL via INTRAVENOUS

## 2018-05-01 MED ORDER — PNEUMOCOCCAL VAC POLYVALENT 25 MCG/0.5ML IJ INJ
0.5000 mL | INJECTION | INTRAMUSCULAR | Status: AC
  Administered 2018-05-02: 0.5 mL via INTRAMUSCULAR
  Filled 2018-05-01: qty 0.5

## 2018-05-01 MED ORDER — ONDANSETRON HCL 4 MG/2ML IJ SOLN
4.0000 mg | Freq: Once | INTRAMUSCULAR | Status: AC
  Administered 2018-05-01: 4 mg via INTRAVENOUS
  Filled 2018-05-01: qty 2

## 2018-05-01 MED ORDER — HYDRALAZINE HCL 20 MG/ML IJ SOLN
5.0000 mg | INTRAMUSCULAR | Status: DC | PRN
  Administered 2018-05-01 – 2018-05-02 (×2): 5 mg via INTRAVENOUS
  Filled 2018-05-01 (×2): qty 1

## 2018-05-01 MED ORDER — ONDANSETRON HCL 4 MG PO TABS: 4.0000 mg | ORAL_TABLET | Freq: Four times a day (QID) | ORAL | Status: DC | PRN

## 2018-05-01 MED ORDER — ONDANSETRON HCL 4 MG/2ML IJ SOLN
4.0000 mg | Freq: Four times a day (QID) | INTRAMUSCULAR | Status: DC | PRN
  Administered 2018-05-01 – 2018-05-03 (×5): 4 mg via INTRAVENOUS
  Filled 2018-05-01 (×5): qty 2

## 2018-05-01 MED ORDER — CHLORHEXIDINE GLUCONATE CLOTH 2 % EX PADS
6.0000 | MEDICATED_PAD | Freq: Every day | CUTANEOUS | Status: DC
  Administered 2018-05-02: 6 via TOPICAL

## 2018-05-01 MED ORDER — HEPARIN SODIUM (PORCINE) 5000 UNIT/ML IJ SOLN
5000.0000 [IU] | Freq: Three times a day (TID) | INTRAMUSCULAR | Status: DC
  Administered 2018-05-01 – 2018-05-02 (×4): 5000 [IU] via SUBCUTANEOUS
  Filled 2018-05-01 (×6): qty 1

## 2018-05-01 MED ORDER — INFLUENZA VAC SPLIT QUAD 0.5 ML IM SUSY
0.5000 mL | PREFILLED_SYRINGE | INTRAMUSCULAR | Status: AC
  Administered 2018-05-02: 0.5 mL via INTRAMUSCULAR
  Filled 2018-05-01: qty 0.5

## 2018-05-01 MED ORDER — PANTOPRAZOLE SODIUM 40 MG PO TBEC
40.0000 mg | DELAYED_RELEASE_TABLET | Freq: Two times a day (BID) | ORAL | Status: DC
  Administered 2018-05-02 – 2018-05-03 (×3): 40 mg via ORAL
  Filled 2018-05-01 (×4): qty 1

## 2018-05-01 MED ORDER — BENZONATATE 100 MG PO CAPS
200.0000 mg | ORAL_CAPSULE | Freq: Three times a day (TID) | ORAL | Status: DC
  Administered 2018-05-02 – 2018-05-04 (×7): 200 mg via ORAL
  Filled 2018-05-01 (×8): qty 2

## 2018-05-01 MED ORDER — MORPHINE SULFATE (PF) 4 MG/ML IV SOLN
4.0000 mg | INTRAVENOUS | Status: DC | PRN
  Administered 2018-05-01 – 2018-05-02 (×4): 4 mg via INTRAVENOUS
  Filled 2018-05-01 (×4): qty 1

## 2018-05-01 NOTE — ED Triage Notes (Signed)
Pt arrived via POV with friend with reports of vomiting x 3 days and generalized pain and weakness. Pt is on dialysis TTS and had last treatment on Thursday.   C/o 10/10 pain.  No vomiting noted in triage. Pt appears paler than normal ethnic color.

## 2018-05-01 NOTE — ED Notes (Signed)
Waiting for admit bed. NAD. VSS.

## 2018-05-01 NOTE — Plan of Care (Signed)

## 2018-05-01 NOTE — H&P (Addendum)
[facility] NAME: Mario [name]    MR#:  [id number]  DATE OF BIRTH:  Apr 07, 1971  DATE OF ADMISSION:  05/01/2018  PRIMARY CARE PHYSICIAN: [name], MD   REQUESTING/REFERRING PHYSICIAN: Lisa Roca, MD  CHIEF COMPLAINT:   Chief Complaint  Patient presents with  . Weakness  . Emesis    HISTORY OF PRESENT ILLNESS:  Mario [name]  is a 47 y.o. male with a known history of ESRD on HD TTS, liver cirrhosis, HTN, chronic combined systolic and diastolic CHF who presented to the ED with nausea, vomiting, and epigastric pain for the last 3 days. He has vomited 5-6 times today. No blood or bile in the emesis. The abdominal pain is "burning", "constant", and "severe". He is unsure if he has taken any NSAIDs because he cannot read. He endorses worsening abdominal distension over the last few months. He last had a paracentesis 6 months ago.  He also notes shortness of breath for the last week. He states he has had to use oxygen in dialysis for the last week. No orthopnea, no lower extremity edema, no chest pain.  He also endorses bilateral face swelling for the last three weeks. He had three lesions pop up on his face (right cheek, left cheek, and left temple) that he thought were zits, but they have continued to get bigger. He then noticed swelling of his cheeks. The lesions have been draining pus. He endorses subjective fevers and chills. He has never had lesions like this before.  In the ED, CXR showed pulmonary edema. CT abdomen/pelvis showed liver cirrhosis and multiple esophageal varices, but no acute findings. Hospitalists were called for admission.  PAST MEDICAL HISTORY:   Past Medical History:  Diagnosis Date  . Abdominal pain 08/28/2016  . Acute hyperkalemia 10/28/2016  . Chest pain 04/11/2016  . Chronic combined systolic and diastolic CHF (congestive heart failure) ([place])   . Cirrhosis ([place])   . Dialysis patient  ([place])   . ESRD (end stage renal disease) on dialysis ([place]) 01/25/2015  . Fluid overload 04/12/2016  . HTN (hypertension) 10/13/2016  . Hyperkalemia 11/27/2016  . Hypertension   . Pulmonary edema 07/03/2016  . Renal disorder   . Renal insufficiency   . Scrotal infection 01/25/2015    PAST SURGICAL HISTORY:   Past Surgical History:  Procedure Laterality Date  . AV FISTULA PLACEMENT      SOCIAL HISTORY:   Social History   Tobacco Use  . Smoking status: Current Every Day Smoker    Packs/day: 0.25    Years: 25.00    Pack years: 6.25    Types: Cigarettes  . Smokeless tobacco: Never Used  Substance Use Topics  . Alcohol use: No    Comment: pt denies    FAMILY HISTORY:   Family History  Problem Relation Age of Onset  . Kidney failure Father   . Diabetes Mother     DRUG ALLERGIES:   Allergies  Allergen Reactions  . Betadine [Povidone Iodine] Rash    REVIEW OF SYSTEMS:   Review of Systems  Constitutional: Positive for chills, fever and malaise/fatigue.  HENT: Positive for sore throat. Negative for sinus pain.   Eyes: Negative for blurred vision and double vision.  Respiratory: Positive for cough and shortness of breath.   Cardiovascular: Negative for chest pain, palpitations, orthopnea and leg swelling.  Gastrointestinal: Positive for abdominal pain, nausea and vomiting. Negative for constipation and diarrhea.  Musculoskeletal:  Negative for neck pain.  Neurological: Positive for dizziness and headaches. Negative for focal weakness.  Psychiatric/Behavioral: Negative for depression. The patient is not nervous/anxious.     MEDICATIONS AT HOME:   Prior to Admission medications   Medication Sig Start Date End Date Taking? Authorizing Provider  albuterol (PROVENTIL HFA;VENTOLIN HFA) 108 (90 Base) MCG/ACT inhaler Inhale 2 puffs into the lungs every 6 (six) hours as needed for wheezing or shortness of breath. Patient not taking: Reported on 03/23/2018 10/02/17   Hillary Bow, MD  ferric citrate (AURYXIA) 1 GM 210 MG(Fe) tablet Take 420 mg by mouth 3 (three) times daily with meals.    [provider]  oxyCODONE-acetaminophen (PERCOCET) 5-325 MG tablet Take 1 tablet by mouth every 4 (four) hours as needed for severe pain. Patient not taking: Reported on 03/23/2018 01/22/18 01/22/19  Lavonia Drafts, MD      VITAL SIGNS:  Blood pressure (!) 161/79, pulse 66, temperature 97.6 F (36.4 C), temperature source Oral, resp. rate (!) 21, height 5' 2.99" (1.6 m), weight 60 kg, SpO2 97 %.  PHYSICAL EXAMINATION:  Physical Exam  GENERAL:  47 y.o.-year-old patient lying in the bed, appears to be in mild pain. EYES: Pupils equal, round, reactive to light and accommodation. No scleral icterus. Extraocular muscles intact.  HEENT: Head atraumatic, normocephalic. Oropharynx and nasopharynx clear. Moist mucous membranes. +bilateral parotid enlargement, parotid glands are tender to palpation. NECK:  Supple, no jugular venous distention. No thyroid enlargement, no tenderness.  LUNGS: Normal work of breathing, +bibasilar crackles, +diffuse rhonchi. CARDIOVASCULAR: S1, S2 normal. No murmurs, rubs, or gallops.  ABDOMEN: +epigastric and RUQ abdominal pain, +abdominal distention, +fluid wave. Bowel sounds present. No organomegaly or mass.  EXTREMITIES: No pedal edema, cyanosis, or clubbing.  NEUROLOGIC: Cranial nerves II through XII are intact. Muscle strength 5/5 in all extremities. Sensation intact. Gait not checked.  PSYCHIATRIC: The patient is alert and oriented x 3.  SKIN: No obvious rashes. +three circular face lesions (right cheek, left cheek, left temple) without any drainage or surrounding erythema.  LABORATORY PANEL:   CBC Recent Labs  Lab 05/01/18 0917  WBC 5.3  HGB 12.2*  HCT 35.5*  PLT 118*   ------------------------------------------------------------------------------------------------------------------  Chemistries  Recent Labs  Lab  05/01/18 0917  NA 132*  K 4.9  CL 91*  CO2 26  GLUCOSE 123*  BUN 41*  CREATININE 7.95*  CALCIUM 9.1  AST 28  ALT 16  ALKPHOS 149*  BILITOT 1.3*   ------------------------------------------------------------------------------------------------------------------  Cardiac Enzymes Recent Labs  Lab 05/01/18 0917  TROPONINI 0.03*   ------------------------------------------------------------------------------------------------------------------  RADIOLOGY:  Ct Abdomen Pelvis W Contrast  Result Date: 05/01/2018 CLINICAL DATA:  Abdominal pain and vomiting. EXAM: CT ABDOMEN AND PELVIS WITH CONTRAST TECHNIQUE: Multidetector CT imaging of the abdomen and pelvis was performed using the standard protocol following bolus administration of intravenous contrast. CONTRAST:  62mL OMNIPAQUE IOHEXOL 300 MG/ML  SOLN COMPARISON:  August 20, 2017 FINDINGS: Lower chest: There is apparent chronic atelectasis in the posterior left base. Lung bases otherwise are clear. There are multiple esophageal varices. The azygos and hemiazygous veins are diffusely dilated and tortuous, unchanged in appearance. Hepatobiliary: The liver has a diffusely nodular contour, unchanged. There is diffuse dysplastic appearing change throughout the liver without well-defined mass evident. Portal vein is patent. The gallbladder wall does not appear appreciably thickened. There is no biliary duct dilatation. Pancreas: No evident pancreatic mass or inflammatory focus. Spleen: Spleen measures 15.6 x 12.9 x 5.9 cm with a measured splenic  volume of 594 cubic cm. No focal splenic lesions are evident. Adrenals/Urinary Tract: Adrenals appear unremarkable bilaterally. Kidneys are atrophic bilaterally. There is extensive peripheral renal arterial calcification. There are cystic areas throughout the kidneys bilaterally consistent with chronic renal disease, stable compared to the previous study. Largest cyst is seen in the right kidney measuring  2.3 x 1.6 cm. There is no hydronephrosis on either side. There is no evident renal or ureteral calculus on either side. Urinary bladder is midline with diffuse wall thickening. There is calcification in the anterior aspect of the urinary bladder wall, stable. Stomach/Bowel: There is no appreciable bowel wall or mesenteric thickening. No evident bowel obstruction. No free air or portal venous air. There is moderate stool throughout the colon. Vascular/Lymphatic: There is aortoiliac atherosclerosis. No aneurysm evident. There are varices throughout the upper abdomen, primarily perisplenic. There are perirectal varices as well. There is also periprostatic variceal change. There are no thrombosed vessels evident. Note that there is calcification in portions of the inferior vena cava and common femoral veins. There is recanalization of the umbilical vein with multiple varices immediately inferior to the rectus muscle throughout the anterior abdominal wall. Reproductive: Prostate and seminal vesicles appear normal in size and contour. No evident pelvic mass. Other: There is extensive ascites throughout the abdomen and pelvis. There is no evident abscess in the abdomen pelvis. Appendix appears normal. Musculoskeletal: Bones show evidence of secondary hyperparathyroidism due to chronic renal failure. There are multiple Schmorl's nodes in the lumbar region. There is evidence of sacroiliitis bilaterally. There is also evidence of erosive change along the left pubic symphysis, stable. No intramuscular lesions are evident. IMPRESSION: 1. Changes of hepatic cirrhosis with diffuse dysplastic change in the liver, stable. 2.  Splenomegaly. 3. Extensive varices at multiple sites as summarized above. Tortuosity and dilatation of the azygos and hemiazygous veins is again noted. There is calcification in the inferior vena cava and common femoral veins, likely due to chronic renal failure. 4. Atrophic kidneys with multiple renal cysts,  consistent with chronic renal failure. No hydronephrosis. No renal or ureteral calculi. 5. Urinary bladder wall thickening concerning for cystitis. Chronic focus of calcification in the anterior aspect of the urinary bladder, potentially due to prior focus of infection. No air seen in the urinary bladder currently. 6.  Aortoiliac atherosclerosis. 7. Bony changes of secondary hyperparathyroidism, a finding likely due to chronic renal failure. There is stable bilateral sacroiliitis as well as erosive change and remodeling in the left pubic symphysis, likely due to chronic secondary hyperparathyroidism. 8. No abscess evident in the abdomen or pelvis. Appendix appears normal. No bowel obstruction. Aortic Atherosclerosis (ICD10-I70.0). Electronically Signed   By: Lowella Grip III M.D.   On: 05/01/2018 11:12   Dg Chest Port 1 View  Result Date: 05/01/2018 CLINICAL DATA:  Vomiting for 3 days with generalized pain and weakness. EXAM: PORTABLE CHEST 1 VIEW COMPARISON:  10/01/2017. FINDINGS: Trachea is midline. Heart is enlarged. Diffuse mixed interstitial and airspace opacification. No pleural fluid. IMPRESSION: Pulmonary edema. Electronically Signed   By: Lorin Picket M.D.   On: 05/01/2018 10:59      IMPRESSION AND PLAN:   Epigastric abdominal pain/nausea/vomiting- likely gastritis vs cholelithiasis. No fevers or leukocytosis to suggest infection. CT abdomen/pelvis without any acute findings. Lipase normal. No chest pain or elevated troponin to suggest cardiac etiology. - abdominal US ordered - GI consult - start PPI bid - trend troponins - morphine for pain, zofran for nausea - NPO for now, will advance  diet as tolerated  Acute exacerbation of chronic combined systolic and diastolic heart failure- CXR with pulmonary edema. Appears mildly volume overloaded. - nephrology consult for HD  Acute bilateral parotitis- concern for mumps vs other viral etiology. Rapid mono screen negative. - EDP spoke  with health department, who recommended labwork and did not feel he was still contagious - f/u labs for mumps, influenza, strep, EBV, saliva culture  Ascites/liver cirrhosis- last paracentesis was 6 months ago. No signs of SBP. CT abdomen/pelvis with extensive esophageal varices and stable cirrhotic changes. - IR paracentesis - abdominal US ordered  ESRD on HD TTS- did not receive HD today. - nephrology consulted- patient will receive HD today  Hypertension- BPs elevated in the ED. Will likely improve after HD. - hydralazine prn  Hyperglycemia- no history of diabetes - check a1c  All the records are reviewed and case discussed with ED provider. Management plans discussed with the patient, family and they are in agreement.  CODE STATUS: Full  TOTAL TIME TAKING CARE OF THIS PATIENT: 45 minutes.    Berna Spare Zannie Locastro M.D on 05/01/2018 at 3:05 PM  Between 7am to 6pm - Pager - [phone]  After 6pm go to [email or web address] - Proofreader  [facility]  Office  [phone]  CC: Primary care physician; [name], MD   Note: This dictation was prepared with Dragon dictation along with smaller phrase technology. Any transcriptional errors that result from this process are unintentional.

## 2018-05-01 NOTE — ED Notes (Signed)
Date and time results received: 05/01/18 0958 (use smartphrase ".now" to insert current time)  Test: troponin Critical Value: 0.03  Name of Provider Notified: lord

## 2018-05-01 NOTE — ED Provider Notes (Signed)
[facility] Emergency Department Provider Note ____________________________________________   I have reviewed the triage vital signs and the triage nursing note.  HISTORY  Chief Complaint Weakness and Emesis   Historian Patient Obtained through [place] interpreter  HPI [name] is a 47 y.o. male with a history of cirrhosis, and end-stage renal disease on dialysis, dialyzes Tuesday Thursday and Saturday, presents with mid and lower abdominal pain and frequent watery emesis, nonbloody most of the night.  No fevers.  He is also had a cough, nonproductive.  Pain is moderate.   Nothing makes it worse or better  After getting patient's nausea and vomiting under control and treating pain, patient did also ask about swelling on his cheeks about the parotids, stating its been there about 2 weeks.  He is a bit of a poor historian.    Past Medical History:  Diagnosis Date  . Abdominal pain 08/28/2016  . Acute hyperkalemia 10/28/2016  . Chest pain 04/11/2016  . Chronic combined systolic and diastolic CHF (congestive heart failure) ([place])   . Cirrhosis ([place])   . Dialysis patient ([place])   . ESRD (end stage renal disease) on dialysis ([place]) 01/25/2015  . Fluid overload 04/12/2016  . HTN (hypertension) 10/13/2016  . Hyperkalemia 11/27/2016  . Hypertension   . Pulmonary edema 07/03/2016  . Renal disorder   . Renal insufficiency   . Scrotal infection 01/25/2015    Patient Active Problem List   Diagnosis Date Noted  . Gastroenteritis 10/02/2017  . Abdominal pain, acute 08/21/2017  . Malnutrition of moderate degree 08/21/2017  . SBP (spontaneous bacterial peritonitis) ([place]) 06/09/2017  . Erroneous encounter - disregard 03/04/2017  . Hyperkalemia 11/27/2016  . Acute hyperkalemia 10/28/2016  . Chronic combined systolic and diastolic CHF (congestive heart failure) ([place]) 10/13/2016  . Cirrhosis ([place]) 10/13/2016  . HTN (hypertension) 10/13/2016  . Abdominal  pain 08/28/2016  . Pulmonary edema 07/03/2016  . Fluid overload 04/12/2016  . Chest pain 04/11/2016  . Scrotal infection 01/25/2015  . ESRD (end stage renal disease) on dialysis ([place]) 01/25/2015    Past Surgical History:  Procedure Laterality Date  . AV FISTULA PLACEMENT      Prior to Admission medications   Medication Sig Start Date End Date Taking? Authorizing Provider  albuterol (PROVENTIL HFA;VENTOLIN HFA) 108 (90 Base) MCG/ACT inhaler Inhale 2 puffs into the lungs every 6 (six) hours as needed for wheezing or shortness of breath. Patient not taking: Reported on 03/23/2018 10/02/17   Hillary Bow, MD  ferric citrate (AURYXIA) 1 GM 210 MG(Fe) tablet Take 420 mg by mouth 3 (three) times daily with meals.    [provider]  oxyCODONE-acetaminophen (PERCOCET) 5-325 MG tablet Take 1 tablet by mouth every 4 (four) hours as needed for severe pain. Patient not taking: Reported on 03/23/2018 01/22/18 01/22/19  Lavonia Drafts, MD    Allergies  Allergen Reactions  . Betadine [Povidone Iodine] Rash    Family History  Problem Relation Age of Onset  . Kidney failure Father   . Diabetes Mother     Social History Social History   Tobacco Use  . Smoking status: Current Every Day Smoker    Packs/day: 0.25    Years: 25.00    Pack years: 6.25    Types: Cigarettes  . Smokeless tobacco: Never Used  Substance Use Topics  . Alcohol use: No    Comment: pt denies  . Drug use: No    Review of Systems  Constitutional: Negative for fever.  Eyes: Negative for visual changes. ENT: Negative for sore throat. Cardiovascular: Negative for chest pain. Respiratory: Negative for shortness of breath.  Positive for cough Gastrointestinal: Positive for abdominal pain and vomiting without diarrhea Genitourinary: Dialysis patient Musculoskeletal: Negative for back pain. Skin: Negative for rash. Neurological: Negative for  headache.  ____________________________________________   PHYSICAL EXAM:  VITAL SIGNS: ED Triage Vitals  Enc Vitals Group     BP 05/01/18 0905 (!) 169/80     Pulse Rate 05/01/18 0905 67     Resp 05/01/18 0905 16     Temp 05/01/18 0905 97.6 F (36.4 C)     Temp Source 05/01/18 0905 Oral     SpO2 05/01/18 0905 100 %     Weight 05/01/18 0904 132 lb 4.4 oz (60 kg)     Height 05/01/18 0904 5' 2.99" (1.6 m)     Head Circumference --      Peak Flow --      Pain Score 05/01/18 0904 10     Pain Loc --      Pain Edu? --      Excl. in [place]? --      Constitutional: Alert and oriented.  HEENT      Head: Normocephalic and atraumatic.      Eyes: Conjunctivae are normal. Pupils equal and round.       Ears:         Nose: No congestion/rhinnorhea.      Mouth/Throat: Mucous membranes are moist.  Swelling about the parotids bilaterally, mildly tender to palpation.      Neck: No stridor. Cardiovascular/Chest: Normal rate, regular rhythm.  No murmurs, rubs, or gallops.  Left upper extremity dialysis access. Respiratory: Normal respiratory effort without tachypnea nor retractions. Breath sounds are clear and equal bilaterally. No wheezes/rales/rhonchi. Gastrointestinal: Soft. No distention, no guarding, no rebound.  Diffuse tenderness although more so in the lower abdomen without any focal right lower quadrant tenderness. Genitourinary/rectal:Deferred Musculoskeletal: Nontender with normal range of motion in all extremities. No joint effusions.  No lower extremity tenderness.  No edema.   Neurologic:  Normal speech and language. No gross or focal neurologic deficits are appreciated. Skin:  Skin is warm, dry and intact.  Multiple skin excoriations on both cheeks. Psychiatric: Mood and affect are normal. Speech and behavior are normal. Patient exhibits appropriate insight and judgment.   ____________________________________________  LABS (pertinent positives/negatives) I, Lisa Roca, MD the  attending physician have reviewed the labs noted below.  Labs Reviewed  TROPONIN I - Abnormal; Notable for the following components:      Result Value   Troponin I 0.03 (*)    All other components within normal limits  COMPREHENSIVE METABOLIC PANEL - Abnormal; Notable for the following components:   Sodium 132 (*)    Chloride 91 (*)    Glucose, Bld 123 (*)    BUN 41 (*)    Creatinine, Ser 7.95 (*)    Alkaline Phosphatase 149 (*)    Total Bilirubin 1.3 (*)    GFR calc non Af Amer 7 (*)    GFR calc Af Amer 8 (*)    All other components within normal limits  CBC WITH DIFFERENTIAL/PLATELET - Abnormal; Notable for the following components:   RBC 3.60 (*)    Hemoglobin 12.2 (*)    HCT 35.5 (*)    RDW 15.2 (*)    Platelets 118 (*)    Lymphs Abs 0.5 (*)    All other components within normal limits  GROUP A STREP BY PCR  LIPASE, BLOOD  MUMPS ANTIBODY, IGG  MUMPS ANTIBODY, IGM  INFLUENZA PANEL BY PCR (TYPE A & B)  MONONUCLEOSIS SCREEN    ____________________________________________    EKG I, Lisa Roca, MD, the attending physician have personally viewed and interpreted all ECGs.  70 bpm.  Normal sinus rhythm.  Narrow QRS.  Normal axis.  Wavy baseline 1 and 3.  No ST segment changes. ____________________________________________  RADIOLOGY   Ct abd pel:   IMPRESSION: 1. Changes of hepatic cirrhosis with diffuse dysplastic change in the liver, stable.  2.  Splenomegaly.  3. Extensive varices at multiple sites as summarized above. Tortuosity and dilatation of the azygos and hemiazygous veins is again noted. There is calcification in the inferior vena cava and common femoral veins, likely due to chronic renal failure.  4. Atrophic kidneys with multiple renal cysts, consistent with chronic renal failure. No hydronephrosis. No renal or ureteral calculi.  5. Urinary bladder wall thickening concerning for cystitis. Chronic focus of calcification in the anterior  aspect of the urinary bladder, potentially due to prior focus of infection. No air seen in the urinary bladder currently.  6.  Aortoiliac atherosclerosis.  7. Bony changes of secondary hyperparathyroidism, a finding likely due to chronic renal failure. There is stable bilateral sacroiliitis as well as erosive change and remodeling in the left pubic symphysis, likely due to chronic secondary hyperparathyroidism.  8. No abscess evident in the abdomen or pelvis. Appendix appears normal. No bowel obstruction.  Aortic Atherosclerosis (ICD10-I70.0).   CXR:  Pulmonary edema __________________________________________  PROCEDURES  Procedure(s) performed: None  Procedures  Critical Care performed: None   ____________________________________________  ED COURSE / ASSESSMENT AND PLAN  Pertinent labs & imaging results that were available during my care of the patient were reviewed by me and considered in my medical decision making (see chart for details).    Patient having with active nausea and vomiting, requiring nausea medications here.  He is complaining of severe abdominal pain.  He is also wheezing quite a bit and coughing.  Given significance of the abdominal pain and the vomiting, CT scan of abdomen was obtained and no obstruction or Meckel/surgical emergency findings found on imaging.  On discussion of results with the patient, patient noted to me that his cheek was swollen in fact it is really both cheeks at the area of the parotid gland.  Raising suspicion for the possibility of parotitis including mumps given the case in the county, I spoke with the [place] and obtained recommendation to check for flu and mono and strep as well as send PCR.  She did indicate that contagiousness would be 2 days before swelling and 5 days after swelling, and given that this is been about 2 weeks, that he really should not be likely contagious at this point.  He  continues to have significant nausea and abdominal pain.  Abdominal pain is epigastric and seems at this point probably more gastritis.  However he is having intractable nausea and abdominal pain, and I will speak the hospitalist for admission.     CONSULTATIONS:  Hospitalist for admission.   Patient / Family / Caregiver informed of clinical course, medical decision-making process, and agree with plan.   ___________________________________________   FINAL CLINICAL IMPRESSION(S) / ED DIAGNOSES   Final diagnoses:  Non-intractable vomiting, presence of nausea not specified, unspecified vomiting type  Acute superficial gastritis without hemorrhage  Wheezing  Acute pulmonary edema (HCC)  Parotid swelling  Upper abdominal pain      ___________________________________________         Note: This dictation was prepared with Dragon dictation. Any transcriptional errors that result from this process are unintentional    Lisa Roca, MD 05/01/18 1417

## 2018-05-01 NOTE — Progress Notes (Signed)
Hd completed 

## 2018-05-01 NOTE — Progress Notes (Signed)
Hd started  

## 2018-05-01 NOTE — ED Notes (Signed)
Pain improving since medication. No needs at this time.  Waiting for admission

## 2018-05-01 NOTE — Progress Notes (Signed)
Patient restless, asking to end the treatment, agreed to continue for another 10 mins.

## 2018-05-01 NOTE — ED Notes (Signed)
Patient transported to CT 

## 2018-05-01 NOTE — Progress Notes (Signed)
[place], [place] 05/01/18  Subjective:   Conversation through [place] interpreter Patient c/o feeling poorly, cough, vomiting x 3 days Not able to keep anything down missed HD today   Objective:  Vital signs in last 24 hours:  Temp:  [97.6 F (36.4 C)] 97.6 F (36.4 C) (09/28 0905) Pulse Rate:  [64-72] 64 (09/28 1500) Resp:  [14-24] 14 (09/28 1500) BP: (161-179)/(77-91) 179/87 (09/28 1500) SpO2:  [94 %-100 %] 98 % (09/28 1500) Weight:  [60 kg] 60 kg (09/28 0904)  Weight change:  Filed Weights   05/01/18 0904  Weight: 60 kg    Intake/Output:    Intake/Output Summary (Last 24 hours) at 05/01/2018 1634 Last data filed at 05/01/2018 1041 Gross per 24 hour  Intake 348.78 ml  Output -  Net 348.78 ml     Physical Exam: General: Chronically ill appearing  HEENT anicteric  Neck Supple,+ distended neck veins  Pulm/lungs Coarse crackles b/l  CVS/Heart Regular, no rub  Abdomen:  Soft, NT  Extremities: No edema  Neurologic: Alert, oreinted  Skin: No acute rashes  Access: Left arm aneusymal AVF       Basic Metabolic Panel:  Recent Labs  Lab 05/01/18 0917  NA 132*  K 4.9  CL 91*  CO2 26  GLUCOSE 123*  BUN 41*  CREATININE 7.95*  CALCIUM 9.1     CBC: Recent Labs  Lab 05/01/18 0917  WBC 5.3  NEUTROABS 4.1  HGB 12.2*  HCT 35.5*  MCV 98.7  PLT 118*      Lab Results  Component Value Date   HEPBSAG Negative 01/06/2017      Microbiology:  Recent Results (from the past 240 hour(s))  Group A Strep by PCR     Status: None   Collection Time: 05/01/18  2:50 PM  Result Value Ref Range Status   Group A Strep by PCR NOT DETECTED NOT DETECTED Final    Comment: Performed at [facility], [address] [place], [place] [address]    Coagulation Studies: No results for input(s): LABPROT, INR in the last 72 hours.  Urinalysis: No results for input(s): COLORURINE, LABSPEC, PHURINE, GLUCOSEU, HGBUR,  BILIRUBINUR, KETONESUR, PROTEINUR, UROBILINOGEN, NITRITE, LEUKOCYTESUR in the last 72 hours.  Invalid input(s): APPERANCEUR    Imaging: Ct Abdomen Pelvis W Contrast  Result Date: 05/01/2018 CLINICAL DATA:  Abdominal pain and vomiting. EXAM: CT ABDOMEN AND PELVIS WITH CONTRAST TECHNIQUE: Multidetector CT imaging of the abdomen and pelvis was performed using the standard protocol following bolus administration of intravenous contrast. CONTRAST:  95mL OMNIPAQUE IOHEXOL 300 MG/ML  SOLN COMPARISON:  August 20, 2017 FINDINGS: Lower chest: There is apparent chronic atelectasis in the posterior left base. Lung bases otherwise are clear. There are multiple esophageal varices. The azygos and hemiazygous veins are diffusely dilated and tortuous, unchanged in appearance. Hepatobiliary: The liver has a diffusely nodular contour, unchanged. There is diffuse dysplastic appearing change throughout the liver without well-defined mass evident. Portal vein is patent. The gallbladder wall does not appear appreciably thickened. There is no biliary duct dilatation. Pancreas: No evident pancreatic mass or inflammatory focus. Spleen: Spleen measures 15.6 x 12.9 x 5.9 cm with a measured splenic volume of 594 cubic cm. No focal splenic lesions are evident. Adrenals/Urinary Tract: Adrenals appear unremarkable bilaterally. Kidneys are atrophic bilaterally. There is extensive peripheral renal arterial calcification. There are cystic areas throughout the kidneys bilaterally consistent with chronic renal disease, stable compared to the previous study. Largest cyst is seen  in the right kidney measuring 2.3 x 1.6 cm. There is no hydronephrosis on either side. There is no evident renal or ureteral calculus on either side. Urinary bladder is midline with diffuse wall thickening. There is calcification in the anterior aspect of the urinary bladder wall, stable. Stomach/Bowel: There is no appreciable bowel wall or mesenteric thickening. No  evident bowel obstruction. No free air or portal venous air. There is moderate stool throughout the colon. Vascular/Lymphatic: There is aortoiliac atherosclerosis. No aneurysm evident. There are varices throughout the upper abdomen, primarily perisplenic. There are perirectal varices as well. There is also periprostatic variceal change. There are no thrombosed vessels evident. Note that there is calcification in portions of the inferior vena cava and common femoral veins. There is recanalization of the umbilical vein with multiple varices immediately inferior to the rectus muscle throughout the anterior abdominal wall. Reproductive: Prostate and seminal vesicles appear normal in size and contour. No evident pelvic mass. Other: There is extensive ascites throughout the abdomen and pelvis. There is no evident abscess in the abdomen pelvis. Appendix appears normal. Musculoskeletal: Bones show evidence of secondary hyperparathyroidism due to chronic renal failure. There are multiple Schmorl's nodes in the lumbar region. There is evidence of sacroiliitis bilaterally. There is also evidence of erosive change along the left pubic symphysis, stable. No intramuscular lesions are evident. IMPRESSION: 1. Changes of hepatic cirrhosis with diffuse dysplastic change in the liver, stable. 2.  Splenomegaly. 3. Extensive varices at multiple sites as summarized above. Tortuosity and dilatation of the azygos and hemiazygous veins is again noted. There is calcification in the inferior vena cava and common femoral veins, likely due to chronic renal failure. 4. Atrophic kidneys with multiple renal cysts, consistent with chronic renal failure. No hydronephrosis. No renal or ureteral calculi. 5. Urinary bladder wall thickening concerning for cystitis. Chronic focus of calcification in the anterior aspect of the urinary bladder, potentially due to prior focus of infection. No air seen in the urinary bladder currently. 6.  Aortoiliac  atherosclerosis. 7. Bony changes of secondary hyperparathyroidism, a finding likely due to chronic renal failure. There is stable bilateral sacroiliitis as well as erosive change and remodeling in the left pubic symphysis, likely due to chronic secondary hyperparathyroidism. 8. No abscess evident in the abdomen or pelvis. Appendix appears normal. No bowel obstruction. Aortic Atherosclerosis (ICD10-I70.0). Electronically Signed   By: Lowella Grip III M.D.   On: 05/01/2018 11:12   Dg Chest Port 1 View  Result Date: 05/01/2018 CLINICAL DATA:  Vomiting for 3 days with generalized pain and weakness. EXAM: PORTABLE CHEST 1 VIEW COMPARISON:  10/01/2017. FINDINGS: Trachea is midline. Heart is enlarged. Diffuse mixed interstitial and airspace opacification. No pleural fluid. IMPRESSION: Pulmonary edema. Electronically Signed   By: Lorin Picket M.D.   On: 05/01/2018 10:59     Medications:    . [START ON 05/02/2018] Chlorhexidine Gluconate Cloth  6 each Topical Q0600   morphine injection, ondansetron **OR** ondansetron (ZOFRAN) IV  Assessment/ Plan:  47 y.o. hispanic male with end stage renal disease on hemodialysis, hypertension, hepatic alcoholic cirrhosis, congestive heart failure  [facility] [place] [facility]  1. ESRD 2. AOCKD 3. Acute pulm edema 4. Nausea and vomiting 5. SHPTH  Plan: Urgent dialysis today for pulm edema. UF goal as tolerated Hgb above goal. Hold EPO Home dose of binders; monitor phos resp precautions, flu test pending   LOS: 0 Malya Cirillo Candiss Norse 9/28/20194:34 PM  [place], [place]  Note: This note  was prepared with Dragon dictation. Any transcription errors are unintentional

## 2018-05-01 NOTE — ED Notes (Addendum)
After speaking with micro dept at [facility], strep swab and 2 flu medias (both buccal specimen) sent to lab; communicated this with lab at [place]

## 2018-05-02 ENCOUNTER — Inpatient Hospital Stay: Payer: Self-pay

## 2018-05-02 DIAGNOSIS — R1110 Vomiting, unspecified: Secondary | ICD-10-CM

## 2018-05-02 DIAGNOSIS — K7031 Alcoholic cirrhosis of liver with ascites: Secondary | ICD-10-CM

## 2018-05-02 LAB — CBC
HEMATOCRIT: 34.6 % — AB (ref 40.0–52.0)
Hemoglobin: 12 g/dL — ABNORMAL LOW (ref 13.0–18.0)
MCH: 33.3 pg (ref 26.0–34.0)
MCHC: 34.6 g/dL (ref 32.0–36.0)
MCV: 96.2 fL (ref 80.0–100.0)
PLATELETS: 116 10*3/uL — AB (ref 150–440)
RBC: 3.59 MIL/uL — ABNORMAL LOW (ref 4.40–5.90)
RDW: 14.8 % — AB (ref 11.5–14.5)
WBC: 7.8 10*3/uL (ref 3.8–10.6)

## 2018-05-02 LAB — HEMOGLOBIN A1C
HEMOGLOBIN A1C: 5.1 % (ref 4.8–5.6)
Mean Plasma Glucose: 99.67 mg/dL

## 2018-05-02 LAB — COMPREHENSIVE METABOLIC PANEL
ALBUMIN: 3.4 g/dL — AB (ref 3.5–5.0)
ALK PHOS: 141 U/L — AB (ref 38–126)
ALT: 15 U/L (ref 0–44)
AST: 24 U/L (ref 15–41)
Anion gap: 15 (ref 5–15)
BILIRUBIN TOTAL: 1.5 mg/dL — AB (ref 0.3–1.2)
BUN: 18 mg/dL (ref 6–20)
CO2: 29 mmol/L (ref 22–32)
CREATININE: 4.21 mg/dL — AB (ref 0.61–1.24)
Calcium: 8.8 mg/dL — ABNORMAL LOW (ref 8.9–10.3)
Chloride: 92 mmol/L — ABNORMAL LOW (ref 98–111)
GFR calc Af Amer: 18 mL/min — ABNORMAL LOW (ref >60)
GFR, EST NON AFRICAN AMERICAN: 15 mL/min — AB (ref >60)
Glucose, Bld: 96 mg/dL (ref 70–99)
Potassium: 3.8 mmol/L (ref 3.5–5.1)
Sodium: 136 mmol/L (ref 135–145)
TOTAL PROTEIN: 7 g/dL (ref 6.5–8.1)

## 2018-05-02 LAB — MUMPS ANTIBODY, IGG: Mumps IgG: 153 [AU]/ml (ref >10.9)

## 2018-05-02 LAB — TROPONIN I
Troponin I: 0.03 ng/mL (ref <0.03)
Troponin I: 0.03 ng/mL (ref <0.03)

## 2018-05-02 MED ORDER — VANCOMYCIN HCL 10 G IV SOLR
1250.0000 mg | Freq: Once | INTRAVENOUS | Status: AC
  Administered 2018-05-02: 1250 mg via INTRAVENOUS
  Filled 2018-05-02: qty 1250

## 2018-05-02 MED ORDER — ACETAMINOPHEN 325 MG PO TABS
650.0000 mg | ORAL_TABLET | Freq: Four times a day (QID) | ORAL | Status: DC | PRN
  Administered 2018-05-02 (×2): 650 mg via ORAL
  Filled 2018-05-02 (×4): qty 2

## 2018-05-02 MED ORDER — SODIUM CHLORIDE 0.9 % IV SOLN
2.0000 g | INTRAVENOUS | Status: DC
  Administered 2018-05-02 – 2018-05-03 (×2): 2 g via INTRAVENOUS
  Filled 2018-05-02: qty 2
  Filled 2018-05-02: qty 20
  Filled 2018-05-02: qty 2

## 2018-05-02 MED ORDER — VANCOMYCIN HCL IN DEXTROSE 750-5 MG/150ML-% IV SOLN
750.0000 mg | INTRAVENOUS | Status: DC
  Administered 2018-05-04: 750 mg via INTRAVENOUS
  Filled 2018-05-02 (×2): qty 150

## 2018-05-02 MED ORDER — NADOLOL 20 MG PO TABS
20.0000 mg | ORAL_TABLET | Freq: Every day | ORAL | Status: DC
  Administered 2018-05-02 – 2018-05-03 (×2): 20 mg via ORAL
  Filled 2018-05-02 (×3): qty 1

## 2018-05-02 NOTE — Plan of Care (Signed)

## 2018-05-02 NOTE — Progress Notes (Signed)
Pharmacy Antibiotic Note  Mario [name] is a 47 y.o. male admitted on 05/01/2018 with facial draining wounds and possible SBP.  Pharmacy has been consulted for vancomycin dosing.  Plan: Vancomycin 1.25 gm IV x 1 followed by vancomycin 750 mg IV Q-dialysis (Tue, Thu, Sat). Pharmacy will continue to follow and adjust as needed for HD sessions and random vanc levels.  Height: 5' 2.99" (160 cm) Weight: 147 lb (66.7 kg) IBW/kg (Calculated) : 56.88  Temp (24hrs), Avg:99 F (37.2 C), Min:97.6 F (36.4 C), Max:100.4 F (38 C)  Recent Labs  Lab 05/01/18 0917 05/01/18 2350  WBC 5.3 7.8  CREATININE 7.95* 4.21*    Estimated Creatinine Clearance: 17.5 mL/min (A) (by C-G formula based on SCr of 4.21 mg/dL (H)).    Allergies  Allergen Reactions  . Betadine [Povidone Iodine] Rash    Antimicrobials this admission:   Dose adjustments this admission:   Microbiology results:  BCx:   UCx:    Sputum:    MRSA PCR:   Thank you for allowing pharmacy to be a part of this patient's care.  Laural Benes, Pharm.D., BCPS Clinical Pharmacist 05/02/2018 8:39 AM

## 2018-05-02 NOTE — Progress Notes (Signed)
Pt complaining of 8/10 chest pain describing it as a "tightness". Pt also complaining of nausea and had multiple episodes of emesis. Prn IV zofran given. Pt febrile. Will given prn Tylenol as well. Prime doc notified. Per orders, EKG obtained. Troponin ordered and blood cultures due to elevated temp. Will continue to monitor.

## 2018-05-02 NOTE — Consult Note (Signed)
    R , MD [address]  [address]  [place], [place] [address]  Main: [phone]  Fax: [phone] Pager: [phone]   Consultation  Referring Provider:     No ref. provider found Primary Care Physician:  Lateef, Munsoor, MD Primary Gastroenterologist:  Dr. Tahiliani         Reason for Consultation:     Epigastric pain, nausea and vomiting  Date of Admission:  05/01/2018 Date of Consultation:  05/02/2018         HPI:   Mario [name] is a 47 y.o. Hispanic male with history of alcoholic cirrhosis, decompensated with ascites, end-stage renal disease, on hemodialysis admitted with 3 days history of nausea, vomiting and abdominal pain, weakness.  He also complained of fever, chills, swelling of his parotid glands as well as lesions on his face.  He underwent CT A/P as well as ultrasound abdomen in the ER with no acute abdominal pathology to explain his abdominal pain was found with an moderate degree of ascites.  Ultrasound paracentesis is ordered.  Patient is on clear liquid diet and Protonix twice daily.  Patient speaks very little English and he reports ongoing nausea, emesis but denied abdominal pain.  He denied melena, rectal bleeding or hematemesis His hemoglobin is at baseline Patient underwent hemodialysis yesterday  NSAIDs: None  Antiplts/Anticoagulants/Anti thrombotics: None  GI Procedures: None  Past Medical History:  Diagnosis Date  . Abdominal pain 08/28/2016  . Acute hyperkalemia 10/28/2016  . Chest pain 04/11/2016  . Chronic combined systolic and diastolic CHF (congestive heart failure) (HCC)   . Cirrhosis (HCC)   . Dialysis patient (HCC)   . ESRD (end stage renal disease) on dialysis (HCC) 01/25/2015  . Fluid overload 04/12/2016  . HTN (hypertension) 10/13/2016  . Hyperkalemia 11/27/2016  . Hypertension   . Pulmonary edema 07/03/2016  . Renal disorder   . Renal insufficiency   . Scrotal infection 01/25/2015    Past Surgical History:   Procedure Laterality Date  . AV FISTULA PLACEMENT      Prior to Admission medications   Medication Sig Start Date End Date Taking? Authorizing Provider  ferric citrate (AURYXIA) 1 GM 210 MG(Fe) tablet Take 420 mg by mouth 3 (three) times daily with meals.   Yes [provider]  albuterol (PROVENTIL HFA;VENTOLIN HFA) 108 (90 Base) MCG/ACT inhaler Inhale 2 puffs into the lungs every 6 (six) hours as needed for wheezing or shortness of breath. Patient not taking: Reported on 03/23/2018 10/02/17   Sudini, Srikar, MD  oxyCODONE-acetaminophen (PERCOCET) 5-325 MG tablet Take 1 tablet by mouth every 4 (four) hours as needed for severe pain. Patient not taking: Reported on 03/23/2018 01/22/18 01/22/19  Kinner, Robert, MD   Current Facility-Administered Medications:  .  acetaminophen (TYLENOL) tablet 650 mg, 650 mg, Oral, Q6H PRN, Sridharan, Prasanna, MD, 650 mg at 05/02/18 0550 .  benzonatate (TESSALON) capsule 200 mg, 200 mg, Oral, TID, Mayo, Katy Dodd, MD, 200 mg at 05/02/18 1123 .  cefTRIAXone (ROCEPHIN) 2 g in sodium chloride 0.9 % 100 mL IVPB, 2 g, Intravenous, Q24H, Wieting, Richard, MD, Last Rate: 200 mL/hr at 05/02/18 1138, 2 g at 05/02/18 1138 .  Chlorhexidine Gluconate Cloth 2 % PADS 6 each, 6 each, Topical, Q0600, Singh, Harmeet, MD, 6 each at 05/02/18 0550 .  guaiFENesin-dextromethorphan (ROBITUSSIN DM) 100-10 MG/5ML syrup 5 mL, 5 mL, Oral, Q4H PRN, Mayo, Katy Dodd, MD .  heparin injection 1,200 Units, 20 Units/kg, Dialysis, PRN, Singh, Harmeet,   MD .  heparin injection 5,000 Units, 5,000 Units, Subcutaneous, Q8H, Mayo, Pete Pelt, MD, 5,000 Units at 05/02/18 0550 .  hydrALAZINE (APRESOLINE) injection 5 mg, 5 mg, Intravenous, Q4H PRN, Mayo, Pete Pelt, MD, 5 mg at 05/01/18 2354 .  morphine 4 MG/ML injection 4 mg, 4 mg, Intravenous, Q3H PRN, Mayo, Pete Pelt, MD, 4 mg at 05/01/18 2358 .  nadolol (CORGARD) tablet 20 mg, 20 mg, Oral, QHS, Wieting, Richard, MD .  ondansetron (ZOFRAN) tablet 4  mg, 4 mg, Oral, Q6H PRN **OR** ondansetron (ZOFRAN) injection 4 mg, 4 mg, Intravenous, Q6H PRN, Mayo, Pete Pelt, MD, 4 mg at 05/02/18 1300 .  pantoprazole (PROTONIX) EC tablet 40 mg, 40 mg, Oral, BID, Mayo, Pete Pelt, MD, 40 mg at 05/02/18 1123 .  [START ON 05/04/2018] vancomycin (VANCOCIN) IVPB 750 mg/150 ml premix, 750 mg, Intravenous, Q T,Th,Sa-HD, Loletha Grayer, MD   Family History  Problem Relation Age of Onset  . Kidney failure Father   . Diabetes Mother      Social History   Tobacco Use  . Smoking status: Current Every Day Smoker    Packs/day: 0.25    Years: 25.00    Pack years: 6.25    Types: Cigarettes  . Smokeless tobacco: Never Used  Substance Use Topics  . Alcohol use: No    Comment: pt denies  . Drug use: No    Allergies as of 05/01/2018 - Review Complete 05/01/2018  Allergen Reaction Noted  . Betadine [povidone iodine] Rash 01/06/2017    Review of Systems:    All systems reviewed and negative except where noted in HPI.   Physical Exam:  Vital signs in last 24 hours: Temp:  [98.4 F (36.9 C)-100.4 F (38 C)] 98.4 F (36.9 C) (09/29 0828) Pulse Rate:  [67-92] 67 (09/29 0828) Resp:  [18-22] 19 (09/29 0828) BP: (139-192)/(59-109) 152/63 (09/29 0828) SpO2:  [96 %-100 %] 100 % (09/29 0828) Weight:  [66.7 kg] 66.7 kg (09/29 0428) Last BM Date: 04/30/18 General: Ill-appearing Head:  Normocephalic and atraumatic. Eyes:   No icterus.   Conjunctiva pink. PERRLA. Ears:  Normal auditory acuity. Neck:  Supple; no masses or thyroidomegaly Lungs: Respirations even and unlabored. Lungs clear to auscultation bilaterally.   No wheezes, crackles, or rhonchi.  Heart:  Regular rate and rhythm;  Without murmur, clicks, rubs or gallops Abdomen:  Soft, moderately distended, dull to percussion, nontender. Normal bowel sounds. No appreciable masses or hepatomegaly.  No rebound or guarding. Caput Medusae present Rectal: Light brown liquid on exam Msk:  Symmetrical without  gross deformities. Extremities:  Without edema, cyanosis or clubbing, left arm AV fistula. Neurologic:  Alert and oriented x3;  grossly normal neurologically. Skin:  Intact without significant lesions or rashes. Cervical Nodes:  No significant cervical adenopathy. Psych:  Alert and cooperative. Normal affect.  LAB RESULTS: CBC Latest Ref Rng & Units 05/01/2018 05/01/2018 03/22/2018  WBC 3.8 - 10.6 K/uL 7.8 5.3 5.6  Hemoglobin 13.0 - 18.0 g/dL 12.0(L) 12.2(L) 12.4(L)  Hematocrit 40.0 - 52.0 % 34.6(L) 35.5(L) 37.2(L)  Platelets 150 - 440 K/uL 116(L) 118(L) 144(L)    BMET BMP Latest Ref Rng & Units 05/01/2018 05/01/2018 03/22/2018  Glucose 70 - 99 mg/dL 96 123(H) 108(H)  BUN 6 - 20 mg/dL 18 41(H) 51(H)  Creatinine 0.61 - 1.24 mg/dL 4.21(H) 7.95(H) 9.81(H)  Sodium 135 - 145 mmol/L 136 132(L) 136  Potassium 3.5 - 5.1 mmol/L 3.8 4.9 5.1  Chloride 98 - 111 mmol/L 92(L) 91(L) 97(L)  CO2 22 - 32 mmol/L 29 26 27  Calcium 8.9 - 10.3 mg/dL 8.8(L) 9.1 9.1    LFT Hepatic Function Latest Ref Rng & Units 05/01/2018 05/01/2018 01/22/2018  Total Protein 6.5 - 8.1 g/dL 7.0 7.4 7.5  Albumin 3.5 - 5.0 g/dL 3.4(L) 3.7 3.8  AST 15 - 41 U/L 24 28 29  ALT 0 - 44 U/L 15 16 18  Alk Phosphatase 38 - 126 U/L 141(H) 149(H) 166(H)  Total Bilirubin 0.3 - 1.2 mg/dL 1.5(H) 1.3(H) 1.3(H)  Bilirubin, Direct 0.1 - 0.5 mg/dL - - -     STUDIES: Us Abdomen Complete  Result Date: 05/02/2018 CLINICAL DATA:  Epigastric/abdominal pain. EXAM: ABDOMEN ULTRASOUND COMPLETE COMPARISON:  None. FINDINGS: Gallbladder: The gallbladder wall is borderline measuring 3.3 mm. No stones, sludge, or Murphy's sign. Common bile duct: Diameter: 5 mm Liver: Heterogeneous nodular appearance consistent with known cirrhosis. No focal mass. Portal vein is patent on color Doppler imaging with normal direction of blood flow towards the liver. IVC: No abnormality visualized. Pancreas: Not well visualized due to shadowing bowel gas. Spleen: Splenomegaly  with the spleen measuring 15 x 5.4 x 11.7 cm. Volume was not calculated. Right Kidney: Length: 7.4 cm.  Small echogenic kidney. Left Kidney: Length: 9.3 cm.  Echogenic kidney.  Small 8 mm cyst. Abdominal aorta: No aneurysm visualized. Other findings: Moderate ascites throughout the abdomen. IMPRESSION: 1. Moderate ascites. 2. The gallbladder wall is borderline in thickness, probably due to the cirrhosis. The gallbladder is otherwise normal. 3. Findings of cirrhosis. 4. Splenomegaly. 5. Echogenic kidneys consistent with medical renal disease. Electronically Signed   By: David  Williams III M.D   On: 05/02/2018 14:27   Ct Abdomen Pelvis W Contrast  Result Date: 05/01/2018 CLINICAL DATA:  Abdominal pain and vomiting. EXAM: CT ABDOMEN AND PELVIS WITH CONTRAST TECHNIQUE: Multidetector CT imaging of the abdomen and pelvis was performed using the standard protocol following bolus administration of intravenous contrast. CONTRAST:  75mL OMNIPAQUE IOHEXOL 300 MG/ML  SOLN COMPARISON:  August 20, 2017 FINDINGS: Lower chest: There is apparent chronic atelectasis in the posterior left base. Lung bases otherwise are clear. There are multiple esophageal varices. The azygos and hemiazygous veins are diffusely dilated and tortuous, unchanged in appearance. Hepatobiliary: The liver has a diffusely nodular contour, unchanged. There is diffuse dysplastic appearing change throughout the liver without well-defined mass evident. Portal vein is patent. The gallbladder wall does not appear appreciably thickened. There is no biliary duct dilatation. Pancreas: No evident pancreatic mass or inflammatory focus. Spleen: Spleen measures 15.6 x 12.9 x 5.9 cm with a measured splenic volume of 594 cubic cm. No focal splenic lesions are evident. Adrenals/Urinary Tract: Adrenals appear unremarkable bilaterally. Kidneys are atrophic bilaterally. There is extensive peripheral renal arterial calcification. There are cystic areas throughout the  kidneys bilaterally consistent with chronic renal disease, stable compared to the previous study. Largest cyst is seen in the right kidney measuring 2.3 x 1.6 cm. There is no hydronephrosis on either side. There is no evident renal or ureteral calculus on either side. Urinary bladder is midline with diffuse wall thickening. There is calcification in the anterior aspect of the urinary bladder wall, stable. Stomach/Bowel: There is no appreciable bowel wall or mesenteric thickening. No evident bowel obstruction. No free air or portal venous air. There is moderate stool throughout the colon. Vascular/Lymphatic: There is aortoiliac atherosclerosis. No aneurysm evident. There are varices throughout the upper abdomen, primarily perisplenic. There are perirectal varices as well. There is also periprostatic variceal   change. There are no thrombosed vessels evident. Note that there is calcification in portions of the inferior vena cava and common femoral veins. There is recanalization of the umbilical vein with multiple varices immediately inferior to the rectus muscle throughout the anterior abdominal wall. Reproductive: Prostate and seminal vesicles appear normal in size and contour. No evident pelvic mass. Other: There is extensive ascites throughout the abdomen and pelvis. There is no evident abscess in the abdomen pelvis. Appendix appears normal. Musculoskeletal: Bones show evidence of secondary hyperparathyroidism due to chronic renal failure. There are multiple Schmorl's nodes in the lumbar region. There is evidence of sacroiliitis bilaterally. There is also evidence of erosive change along the left pubic symphysis, stable. No intramuscular lesions are evident. IMPRESSION: 1. Changes of hepatic cirrhosis with diffuse dysplastic change in the liver, stable. 2.  Splenomegaly. 3. Extensive varices at multiple sites as summarized above. Tortuosity and dilatation of the azygos and hemiazygous veins is again noted. There is  calcification in the inferior vena cava and common femoral veins, likely due to chronic renal failure. 4. Atrophic kidneys with multiple renal cysts, consistent with chronic renal failure. No hydronephrosis. No renal or ureteral calculi. 5. Urinary bladder wall thickening concerning for cystitis. Chronic focus of calcification in the anterior aspect of the urinary bladder, potentially due to prior focus of infection. No air seen in the urinary bladder currently. 6.  Aortoiliac atherosclerosis. 7. Bony changes of secondary hyperparathyroidism, a finding likely due to chronic renal failure. There is stable bilateral sacroiliitis as well as erosive change and remodeling in the left pubic symphysis, likely due to chronic secondary hyperparathyroidism. 8. No abscess evident in the abdomen or pelvis. Appendix appears normal. No bowel obstruction. Aortic Atherosclerosis (ICD10-I70.0). Electronically Signed   By: William  Woodruff III M.D.   On: 05/01/2018 11:12   Dg Chest Port 1 View  Result Date: 05/01/2018 CLINICAL DATA:  Vomiting for 3 days with generalized pain and weakness. EXAM: PORTABLE CHEST 1 VIEW COMPARISON:  10/01/2017. FINDINGS: Trachea is midline. Heart is enlarged. Diffuse mixed interstitial and airspace opacification. No pleural fluid. IMPRESSION: Pulmonary edema. Electronically Signed   By: Melinda  Blietz M.D.   On: 05/01/2018 10:59      Impression / Plan:   Mario [name] is a 47 y.o. Hispanic male with alcoholic cirrhosis, decompensated due to ascites, ESRD on hemodialysis admitted with 4 days history of abdominal pain, nausea and vomiting.  Abdominal pain has improved.  He also has draining skin lesions on the face and bilateral parotitis. Started vancomycin and rocephin.  Mumps titers IgG came back positive, which means he is immune, IgM titers are pending, he is currently on droplet precautions  Nausea and vomiting: Differentials include viral gastritis or gastroparesis or H.  pylori gastritis or peptic ulcer disease Recommend EGD as inpatient once patient is off droplet precautions Continue antiemetics and Protonix 40 mg twice daily for now Can be advanced to full liquids if symptoms are improved by tomorrow  Decompensated cirrhosis: Likely secondary to alcohol use Portal hypertension with extensive varices Ascites: Paracentesis as needed Therapeutic paracentesis has been ordered, recommend ascitic fluid analysis  Currently on vancomycin and Rocephin Varices: He will need EGD for variceal screening No evidence of active GI bleed, hemoglobin stable PSE: none HCC screening: Ultrasound and CT with no evidence of liver lesions  Recommend to establish care with GI as outpatient upon discharge for management of his decompensated cirrhosis   Dr. Tahiliani to cover from tomorrow  Thank you   for involving me in the care of this patient.      LOS: 1 day   Sherri Sear, MD  05/02/2018, 4:09 PM   Note: This dictation was prepared with Dragon dictation along with smaller phrase technology. Any transcriptional errors that result from this process are unintentional.

## 2018-05-02 NOTE — Progress Notes (Signed)
[place], [place] 05/02/18  Subjective:   Conversation through [place] interpreter Patient c/o feeling poorly although a little better today.  1870 cc of fluid removed with hemodialysis yesterday.  Breathing has improved.  Currently on room air.  Hungry, asking for food   Objective:  Vital signs in last 24 hours:  Temp:  [98.4 F (36.9 C)-100.4 F (38 C)] 98.4 F (36.9 C) (09/29 0828) Pulse Rate:  [64-92] 67 (09/29 0828) Resp:  [14-22] 19 (09/29 0828) BP: (139-192)/(59-109) 152/63 (09/29 0828) SpO2:  [96 %-100 %] 100 % (09/29 0828) Weight:  [66.7 kg] 66.7 kg (09/29 0428)  Weight change:  Filed Weights   05/01/18 0904 05/02/18 0428  Weight: 60 kg 66.7 kg    Intake/Output:    Intake/Output Summary (Last 24 hours) at 05/02/2018 1350 Last data filed at 05/02/2018 0300 Gross per 24 hour  Intake -  Output 1869 ml  Net -1869 ml     Physical Exam: General: Chronically ill appearing  HEENT anicteric  Neck Supple,   Pulm/lungs Coarse crackles b/l, improved exam compared to yesterday  CVS/Heart Regular, no rub  Abdomen:  Soft, NT  Extremities: No edema  Neurologic: Alert, oreinted  Skin: No acute rashes  Access: Left arm aneusymal AVF       Basic Metabolic Panel:  Recent Labs  Lab 05/01/18 0917 05/01/18 1823 05/01/18 2350  NA 132*  --  136  K 4.9  --  3.8  CL 91*  --  92*  CO2 26  --  29  GLUCOSE 123*  --  96  BUN 41*  --  18  CREATININE 7.95*  --  4.21*  CALCIUM 9.1  --  8.8*  PHOS  --  6.8*  --      CBC: Recent Labs  Lab 05/01/18 0917 05/01/18 2350  WBC 5.3 7.8  NEUTROABS 4.1  --   HGB 12.2* 12.0*  HCT 35.5* 34.6*  MCV 98.7 96.2  PLT 118* 116*      Lab Results  Component Value Date   HEPBSAG Negative 01/06/2017      Microbiology:  Recent Results (from the past 240 hour(s))  Group A Strep by PCR     Status: None   Collection Time: 05/01/18  2:50 PM  Result Value Ref Range Status   Group A Strep by  PCR NOT DETECTED NOT DETECTED Final    Comment: Performed at [facility], [address] [place], [place] [address]  CULTURE, BLOOD (ROUTINE X 2) w Reflex to ID Panel     Status: None (Preliminary result)   Collection Time: 05/02/18  4:36 AM  Result Value Ref Range Status   Specimen Description BLOOD BLOOD RIGHT ARM  Final   Special Requests   Final    BOTTLES DRAWN AEROBIC AND ANAEROBIC Blood Culture adequate volume   Culture   Final    NO GROWTH <12 HOURS Performed at [facility], [address] [place], [place] [address]    Report Status PENDING  Incomplete  CULTURE, BLOOD (ROUTINE X 2) w Reflex to ID Panel     Status: None (Preliminary result)   Collection Time: 05/02/18  4:42 AM  Result Value Ref Range Status   Specimen Description BLOOD BLOOD RIGHT HAND  Final   Special Requests   Final    BOTTLES DRAWN AEROBIC AND ANAEROBIC Blood Culture adequate volume   Culture   Final    NO GROWTH <12 HOURS Performed at [facility]  [facility], [address], [place], [place] [phone]    Report Status PENDING  Incomplete    Coagulation Studies: No results for input(s): LABPROT, INR in the last 72 hours.  Urinalysis: No results for input(s): COLORURINE, LABSPEC, PHURINE, GLUCOSEU, HGBUR, BILIRUBINUR, KETONESUR, PROTEINUR, UROBILINOGEN, NITRITE, LEUKOCYTESUR in the last 72 hours.  Invalid input(s): APPERANCEUR    Imaging: Ct Abdomen Pelvis W Contrast  Result Date: 05/01/2018 CLINICAL DATA:  Abdominal pain and vomiting. EXAM: CT ABDOMEN AND PELVIS WITH CONTRAST TECHNIQUE: Multidetector CT imaging of the abdomen and pelvis was performed using the standard protocol following bolus administration of intravenous contrast. CONTRAST:  79mL OMNIPAQUE IOHEXOL 300 MG/ML  SOLN COMPARISON:  August 20, 2017 FINDINGS: Lower chest: There is apparent chronic atelectasis in the posterior left base. Lung bases otherwise are clear. There are multiple esophageal varices. The azygos  and hemiazygous veins are diffusely dilated and tortuous, unchanged in appearance. Hepatobiliary: The liver has a diffusely nodular contour, unchanged. There is diffuse dysplastic appearing change throughout the liver without well-defined mass evident. Portal vein is patent. The gallbladder wall does not appear appreciably thickened. There is no biliary duct dilatation. Pancreas: No evident pancreatic mass or inflammatory focus. Spleen: Spleen measures 15.6 x 12.9 x 5.9 cm with a measured splenic volume of 594 cubic cm. No focal splenic lesions are evident. Adrenals/Urinary Tract: Adrenals appear unremarkable bilaterally. Kidneys are atrophic bilaterally. There is extensive peripheral renal arterial calcification. There are cystic areas throughout the kidneys bilaterally consistent with chronic renal disease, stable compared to the previous study. Largest cyst is seen in the right kidney measuring 2.3 x 1.6 cm. There is no hydronephrosis on either side. There is no evident renal or ureteral calculus on either side. Urinary bladder is midline with diffuse wall thickening. There is calcification in the anterior aspect of the urinary bladder wall, stable. Stomach/Bowel: There is no appreciable bowel wall or mesenteric thickening. No evident bowel obstruction. No free air or portal venous air. There is moderate stool throughout the colon. Vascular/Lymphatic: There is aortoiliac atherosclerosis. No aneurysm evident. There are varices throughout the upper abdomen, primarily perisplenic. There are perirectal varices as well. There is also periprostatic variceal change. There are no thrombosed vessels evident. Note that there is calcification in portions of the inferior vena cava and common femoral veins. There is recanalization of the umbilical vein with multiple varices immediately inferior to the rectus muscle throughout the anterior abdominal wall. Reproductive: Prostate and seminal vesicles appear normal in size and  contour. No evident pelvic mass. Other: There is extensive ascites throughout the abdomen and pelvis. There is no evident abscess in the abdomen pelvis. Appendix appears normal. Musculoskeletal: Bones show evidence of secondary hyperparathyroidism due to chronic renal failure. There are multiple Schmorl's nodes in the lumbar region. There is evidence of sacroiliitis bilaterally. There is also evidence of erosive change along the left pubic symphysis, stable. No intramuscular lesions are evident. IMPRESSION: 1. Changes of hepatic cirrhosis with diffuse dysplastic change in the liver, stable. 2.  Splenomegaly. 3. Extensive varices at multiple sites as summarized above. Tortuosity and dilatation of the azygos and hemiazygous veins is again noted. There is calcification in the inferior vena cava and common femoral veins, likely due to chronic renal failure. 4. Atrophic kidneys with multiple renal cysts, consistent with chronic renal failure. No hydronephrosis. No renal or ureteral calculi. 5. Urinary bladder wall thickening concerning for cystitis. Chronic focus of calcification in the anterior aspect of the urinary bladder, potentially due to prior  focus of infection. No air seen in the urinary bladder currently. 6.  Aortoiliac atherosclerosis. 7. Bony changes of secondary hyperparathyroidism, a finding likely due to chronic renal failure. There is stable bilateral sacroiliitis as well as erosive change and remodeling in the left pubic symphysis, likely due to chronic secondary hyperparathyroidism. 8. No abscess evident in the abdomen or pelvis. Appendix appears normal. No bowel obstruction. Aortic Atherosclerosis (ICD10-I70.0). Electronically Signed   By: Lowella Grip III M.D.   On: 05/01/2018 11:12   Dg Chest Port 1 View  Result Date: 05/01/2018 CLINICAL DATA:  Vomiting for 3 days with generalized pain and weakness. EXAM: PORTABLE CHEST 1 VIEW COMPARISON:  10/01/2017. FINDINGS: Trachea is midline. Heart is  enlarged. Diffuse mixed interstitial and airspace opacification. No pleural fluid. IMPRESSION: Pulmonary edema. Electronically Signed   By: Lorin Picket M.D.   On: 05/01/2018 10:59     Medications:   . cefTRIAXone (ROCEPHIN)  IV 2 g (05/02/18 1138)  . vancomycin 1,250 mg (05/02/18 1304)  . [START ON 05/04/2018] vancomycin     . benzonatate  200 mg Oral TID  . Chlorhexidine Gluconate Cloth  6 each Topical Q0600  . heparin  5,000 Units Subcutaneous Q8H  . nadolol  20 mg Oral QHS  . pantoprazole  40 mg Oral BID   acetaminophen, guaiFENesin-dextromethorphan, heparin, hydrALAZINE, morphine injection, ondansetron **OR** ondansetron (ZOFRAN) IV  Assessment/ Plan:  47 y.o. hispanic male with end stage renal disease on hemodialysis, hypertension, hepatic alcoholic cirrhosis, congestive heart failure  [facility] [place] [facility]  1. ESRD 2. AOCKD 3. Acute pulm edema 4. Nausea and vomiting 5. SHPTH  Plan: Patient underwent urgent dialysis on Saturday for pulm edema. UF of 1870 cc was achieved.  Breathing is better today.  Evaluation for facial lesions parotid swelling is in progress. Hgb above goal. Hold EPO Home dose of binders; monitor phos resp precautions, flu test is negative   LOS: [place] 9/29/20191:50 PM  [place], [place]  Note: This note was prepared with Dragon dictation. Any transcription errors are unintentional

## 2018-05-02 NOTE — Progress Notes (Signed)
Patient ID: [facility] Mario [name], male   DOB: 19-Jan-1971, 47 y.o.   MRN: [id number]  Mario [name] ZLD:[id number] DOB: 30-Mar-1971 DOA: 05/01/2018 PCP: [name], MD  HPI/Subjective: Patient feeling better, asking to eat, states draining lesions on face for three weeks with parotid swelling  Objective: Vitals:   05/02/18 0539 05/02/18 0828  BP:  (!) 152/63  Pulse:  67  Resp:  19  Temp: 99.1 F (37.3 C) 98.4 F (36.9 C)  SpO2:  100%    Filed Weights   05/01/18 0904 05/02/18 0428  Weight: 60 kg 66.7 kg    ROS: Review of Systems  Constitutional: Positive for malaise/fatigue. Negative for chills and fever.  Eyes: Negative for blurred vision.  Respiratory: Negative for cough and shortness of breath.   Cardiovascular: Negative for chest pain.  Gastrointestinal: Positive for abdominal pain. Negative for constipation, diarrhea, nausea and vomiting.  Genitourinary: Negative for dysuria.  Musculoskeletal: Negative for joint pain.  Neurological: Negative for dizziness and headaches.   Exam: Physical Exam  Constitutional: He is oriented to person, place, and time.  HENT:  Nose: No mucosal edema.  Mouth/Throat: No oropharyngeal exudate or posterior oropharyngeal edema.  Eyes: Pupils are equal, round, and reactive to light. Conjunctivae, EOM and lids are normal.  Neck: No JVD present. Carotid bruit is not present. No edema present. No thyroid mass and no thyromegaly present.  Cardiovascular: S1 normal and S2 normal. Exam reveals no gallop.  No murmur heard. Pulses:      Dorsalis pedis pulses are 2+ on the right side, and 2+ on the left side.  Respiratory: No respiratory distress. He has decreased breath sounds in the right lower field and the left lower field. He has no wheezes. He has no rhonchi. He has no rales.  GI: Soft. Bowel sounds are normal. There is no tenderness.  Musculoskeletal:       Right ankle: He exhibits  swelling.       Left ankle: He exhibits swelling.  Lymphadenopathy:    He has no cervical adenopathy.  Neurological: He is alert and oriented to person, place, and time. No cranial nerve deficit.  Skin: Skin is warm. No rash noted. Nails show no clubbing.  Dried skin lesions face, bilateral cheek and left forehead  Psychiatric: He has a normal mood and affect.      Data Reviewed: Basic Metabolic Panel: Recent Labs  Lab 05/01/18 0917 05/01/18 1823 05/01/18 2350  NA 132*  --  136  K 4.9  --  3.8  CL 91*  --  92*  CO2 26  --  29  GLUCOSE 123*  --  96  BUN 41*  --  18  CREATININE 7.95*  --  4.21*  CALCIUM 9.1  --  8.8*  PHOS  --  6.8*  --    Liver Function Tests: Recent Labs  Lab 05/01/18 0917 05/01/18 2350  AST 28 24  ALT 16 15  ALKPHOS 149* 141*  BILITOT 1.3* 1.5*  PROT 7.4 7.0  ALBUMIN 3.7 3.4*   Recent Labs  Lab 05/01/18 0917  LIPASE 41   CBC: Recent Labs  Lab 05/01/18 0917 05/01/18 2350  WBC 5.3 7.8  NEUTROABS 4.1  --   HGB 12.2* 12.0*  HCT 35.5* 34.6*  MCV 98.7 96.2  PLT 118* 116*   Cardiac Enzymes: Recent Labs  Lab 05/01/18 0917 05/01/18 1823 05/01/18 2354 05/02/18 0436  TROPONINI 0.03* 0.03* 0.03* 0.03*  Recent Results (from the past 240 hour(s))  Group A Strep by PCR     Status: None   Collection Time: 05/01/18  2:50 PM  Result Value Ref Range Status   Group A Strep by PCR NOT DETECTED NOT DETECTED Final    Comment: Performed at [facility], [address] [place], [place] [address]  CULTURE, BLOOD (ROUTINE X 2) w Reflex to ID Panel     Status: None (Preliminary result)   Collection Time: 05/02/18  4:36 AM  Result Value Ref Range Status   Specimen Description BLOOD BLOOD RIGHT ARM  Final   Special Requests   Final    BOTTLES DRAWN AEROBIC AND ANAEROBIC Blood Culture adequate volume   Culture   Final    NO GROWTH <12 HOURS Performed at [facility], [address], [place], [place] [address]    Report  Status PENDING  Incomplete  CULTURE, BLOOD (ROUTINE X 2) w Reflex to ID Panel     Status: None (Preliminary result)   Collection Time: 05/02/18  4:42 AM  Result Value Ref Range Status   Specimen Description BLOOD BLOOD RIGHT HAND  Final   Special Requests   Final    BOTTLES DRAWN AEROBIC AND ANAEROBIC Blood Culture adequate volume   Culture   Final    NO GROWTH <12 HOURS Performed at [facility], [address] [place], [place] [address]    Report Status PENDING  Incomplete     Studies: Ct Abdomen Pelvis W Contrast  Result Date: 05/01/2018 CLINICAL DATA:  Abdominal pain and vomiting. EXAM: CT ABDOMEN AND PELVIS WITH CONTRAST TECHNIQUE: Multidetector CT imaging of the abdomen and pelvis was performed using the standard protocol following bolus administration of intravenous contrast. CONTRAST:  3mL OMNIPAQUE IOHEXOL 300 MG/ML  SOLN COMPARISON:  August 20, 2017 FINDINGS: Lower chest: There is apparent chronic atelectasis in the posterior left base. Lung bases otherwise are clear. There are multiple esophageal varices. The azygos and hemiazygous veins are diffusely dilated and tortuous, unchanged in appearance. Hepatobiliary: The liver has a diffusely nodular contour, unchanged. There is diffuse dysplastic appearing change throughout the liver without well-defined mass evident. Portal vein is patent. The gallbladder wall does not appear appreciably thickened. There is no biliary duct dilatation. Pancreas: No evident pancreatic mass or inflammatory focus. Spleen: Spleen measures 15.6 x 12.9 x 5.9 cm with a measured splenic volume of 594 cubic cm. No focal splenic lesions are evident. Adrenals/Urinary Tract: Adrenals appear unremarkable bilaterally. Kidneys are atrophic bilaterally. There is extensive peripheral renal arterial calcification. There are cystic areas throughout the kidneys bilaterally consistent with chronic renal disease, stable compared to the previous study. Largest cyst is  seen in the right kidney measuring 2.3 x 1.6 cm. There is no hydronephrosis on either side. There is no evident renal or ureteral calculus on either side. Urinary bladder is midline with diffuse wall thickening. There is calcification in the anterior aspect of the urinary bladder wall, stable. Stomach/Bowel: There is no appreciable bowel wall or mesenteric thickening. No evident bowel obstruction. No free air or portal venous air. There is moderate stool throughout the colon. Vascular/Lymphatic: There is aortoiliac atherosclerosis. No aneurysm evident. There are varices throughout the upper abdomen, primarily perisplenic. There are perirectal varices as well. There is also periprostatic variceal change. There are no thrombosed vessels evident. Note that there is calcification in portions of the inferior vena cava and common femoral veins. There is recanalization of the umbilical vein with multiple  varices immediately inferior to the rectus muscle throughout the anterior abdominal wall. Reproductive: Prostate and seminal vesicles appear normal in size and contour. No evident pelvic mass. Other: There is extensive ascites throughout the abdomen and pelvis. There is no evident abscess in the abdomen pelvis. Appendix appears normal. Musculoskeletal: Bones show evidence of secondary hyperparathyroidism due to chronic renal failure. There are multiple Schmorl's nodes in the lumbar region. There is evidence of sacroiliitis bilaterally. There is also evidence of erosive change along the left pubic symphysis, stable. No intramuscular lesions are evident. IMPRESSION: 1. Changes of hepatic cirrhosis with diffuse dysplastic change in the liver, stable. 2.  Splenomegaly. 3. Extensive varices at multiple sites as summarized above. Tortuosity and dilatation of the azygos and hemiazygous veins is again noted. There is calcification in the inferior vena cava and common femoral veins, likely due to chronic renal failure. 4. Atrophic  kidneys with multiple renal cysts, consistent with chronic renal failure. No hydronephrosis. No renal or ureteral calculi. 5. Urinary bladder wall thickening concerning for cystitis. Chronic focus of calcification in the anterior aspect of the urinary bladder, potentially due to prior focus of infection. No air seen in the urinary bladder currently. 6.  Aortoiliac atherosclerosis. 7. Bony changes of secondary hyperparathyroidism, a finding likely due to chronic renal failure. There is stable bilateral sacroiliitis as well as erosive change and remodeling in the left pubic symphysis, likely due to chronic secondary hyperparathyroidism. 8. No abscess evident in the abdomen or pelvis. Appendix appears normal. No bowel obstruction. Aortic Atherosclerosis (ICD10-I70.0). Electronically Signed   By: Lowella Grip III M.D.   On: 05/01/2018 11:12   Dg Chest Port 1 View  Result Date: 05/01/2018 CLINICAL DATA:  Vomiting for 3 days with generalized pain and weakness. EXAM: PORTABLE CHEST 1 VIEW COMPARISON:  10/01/2017. FINDINGS: Trachea is midline. Heart is enlarged. Diffuse mixed interstitial and airspace opacification. No pleural fluid. IMPRESSION: Pulmonary edema. Electronically Signed   By: Lorin Picket M.D.   On: 05/01/2018 10:59    Scheduled Meds: . benzonatate  200 mg Oral TID  . Chlorhexidine Gluconate Cloth  6 each Topical Q0600  . heparin  5,000 Units Subcutaneous Q8H  . Influenza vac split quadrivalent PF  0.5 mL Intramuscular Tomorrow-1000  . pantoprazole  40 mg Oral BID  . pneumococcal 23 valent vaccine  0.5 mL Intramuscular Tomorrow-1000   Continuous Infusions: . cefTRIAXone (ROCEPHIN)  IV      Assessment/Plan:  1. Epigastric abdominal pain, nausea and vomiting.  Patient feeling better and wants to eat.  NPO for [place] this am. GI consult. PPI. 2. Fluid overload, chronic systolic chf improved after dialysis last night. 3. Draining skin lesions face and bilateral parotitis. Start  vancomycin and rocephin. 4. Cirrhosis with ascites. Varices seen on ct scan. Send hepatitis profiles.  US paracentesis ordered.  Empiric antibiotics 5. ESRD on Dialysis T/TH/SA  Code Status:     Code Status Orders  (From admission, onward)         Start     Ordered   05/01/18 1758  Full code  Continuous     05/01/18 1757        Code Status History    Date Active Date Inactive Code Status Order ID Comments User Context   10/02/2017 0737 10/02/2017 1659 Full Code [id number]  Gorden Harms, MD Inpatient   08/21/2017 0208 08/23/2017 1621 Full Code [id number]  Amelia Jo, MD ED   06/09/2017 2048 06/13/2017 1907 Full Code [id number]  Dustin Flock,  MD Inpatient   01/06/2017 1437 01/07/2017 1744 Full Code [id number]  Epifanio Lesches, MD ED   11/27/2016 1431 11/30/2016 1423 Full Code [id number]  Henreitta Leber, MD Inpatient   10/28/2016 1611 10/31/2016 1638 Full Code [id number]  Epifanio Lesches, MD ED   10/13/2016 2136 10/14/2016 2123 Full Code [id number]  Lance Coon, MD Inpatient   08/28/2016 1658 08/30/2016 1719 Full Code [id number]  Fritzi Mandes, MD ED   07/03/2016 2006 07/06/2016 1622 Full Code [id number]  Fritzi Mandes, MD ED   04/11/2016 1857 04/12/2016 0716 Full Code [id number]  Vaughan Basta, MD Inpatient   01/25/2015 1229 01/28/2015 1626 Full Code [id number]  Dustin Flock, MD Inpatient      Disposition Plan: TBD  Consultants:  Nephrology  Gastroenterology  Antibiotics:  Vancomycin, rocephin  Time spent: 28 minutes, seen with translator and [place]

## 2018-05-03 ENCOUNTER — Inpatient Hospital Stay: Payer: Self-pay

## 2018-05-03 DIAGNOSIS — R109 Unspecified abdominal pain: Secondary | ICD-10-CM

## 2018-05-03 LAB — MISC LABCORP TEST (SEND OUT)

## 2018-05-03 LAB — PROTEIN, PLEURAL OR PERITONEAL FLUID: Total protein, fluid: 3.6 g/dL

## 2018-05-03 LAB — BASIC METABOLIC PANEL
ANION GAP: 16 — AB (ref 5–15)
BUN: 35 mg/dL — ABNORMAL HIGH (ref 6–20)
CHLORIDE: 92 mmol/L — AB (ref 98–111)
CO2: 26 mmol/L (ref 22–32)
Calcium: 9 mg/dL (ref 8.9–10.3)
Creatinine, Ser: 6.9 mg/dL — ABNORMAL HIGH (ref 0.61–1.24)
GFR calc Af Amer: 10 mL/min — ABNORMAL LOW (ref >60)
GFR calc non Af Amer: 9 mL/min — ABNORMAL LOW (ref >60)
GLUCOSE: 86 mg/dL (ref 70–99)
Potassium: 5 mmol/L (ref 3.5–5.1)
Sodium: 134 mmol/L — ABNORMAL LOW (ref 135–145)

## 2018-05-03 LAB — GLUCOSE, PLEURAL OR PERITONEAL FLUID: GLUCOSE FL: 121 mg/dL

## 2018-05-03 LAB — MAGNESIUM: MAGNESIUM: 2.6 mg/dL — AB (ref 1.7–2.4)

## 2018-05-03 LAB — BODY FLUID CELL COUNT WITH DIFFERENTIAL
EOS FL: 0 %
LYMPHS FL: 18 %
MONOCYTE-MACROPHAGE-SEROUS FLUID: 80 %
NEUTROPHIL FLUID: 2 %
OTHER CELLS FL: 0 %
Total Nucleated Cell Count, Fluid: 365 cu mm

## 2018-05-03 LAB — PHOSPHORUS: Phosphorus: 6.8 mg/dL — ABNORMAL HIGH (ref 2.5–4.6)

## 2018-05-03 LAB — EPSTEIN-BARR VIRUS VCA, IGM: EBV VCA IgM: <36 U/mL (ref 0.0–35.9)

## 2018-05-03 LAB — HIV ANTIBODY (ROUTINE TESTING W REFLEX): HIV SCREEN 4TH GENERATION: NONREACTIVE

## 2018-05-03 MED ORDER — FERRIC CITRATE 1 GM 210 MG(FE) PO TABS
420.0000 mg | ORAL_TABLET | Freq: Three times a day (TID) | ORAL | Status: DC
  Administered 2018-05-03: 420 mg via ORAL
  Filled 2018-05-03 (×4): qty 2

## 2018-05-03 MED ORDER — DEXAMETHASONE SODIUM PHOSPHATE 10 MG/ML IJ SOLN
10.0000 mg | Freq: Two times a day (BID) | INTRAMUSCULAR | Status: DC
  Administered 2018-05-03 – 2018-05-04 (×3): 10 mg via INTRAVENOUS
  Filled 2018-05-03 (×4): qty 1

## 2018-05-03 MED ORDER — VITAMIN C 500 MG PO TABS: 250.0000 mg | ORAL_TABLET | Freq: Two times a day (BID) | ORAL | Status: DC

## 2018-05-03 MED ORDER — PROMETHAZINE HCL 25 MG/ML IJ SOLN
12.5000 mg | Freq: Four times a day (QID) | INTRAMUSCULAR | Status: DC | PRN
  Administered 2018-05-03 (×2): 12.5 mg via INTRAVENOUS
  Filled 2018-05-03 (×2): qty 1

## 2018-05-03 MED ORDER — NEPRO/CARBSTEADY PO LIQD
237.0000 mL | Freq: Two times a day (BID) | ORAL | Status: DC
  Administered 2018-05-03: 237 mL via ORAL

## 2018-05-03 MED ORDER — VITAMIN C 500 MG PO TABS
500.0000 mg | ORAL_TABLET | Freq: Two times a day (BID) | ORAL | Status: DC
  Administered 2018-05-03: 500 mg via ORAL
  Filled 2018-05-03 (×2): qty 1

## 2018-05-03 MED ORDER — VITAMIN B-1 100 MG PO TABS
100.0000 mg | ORAL_TABLET | Freq: Every day | ORAL | Status: DC
  Administered 2018-05-03 – 2018-05-04 (×2): 100 mg via ORAL
  Filled 2018-05-03 (×2): qty 1

## 2018-05-03 MED ORDER — RENA-VITE PO TABS
1.0000 | ORAL_TABLET | Freq: Every day | ORAL | Status: DC
  Administered 2018-05-03: 1 via ORAL
  Filled 2018-05-03 (×2): qty 1

## 2018-05-03 MED ORDER — OXYCODONE HCL 5 MG PO TABS
5.0000 mg | ORAL_TABLET | ORAL | Status: DC | PRN
  Administered 2018-05-03: 5 mg via ORAL
  Filled 2018-05-03: qty 1

## 2018-05-03 NOTE — Care Management (Signed)
RNCM to assess patient needs.  Patient in pain.  Will assess at a later time.  Patient states he has emergency Medicaid.

## 2018-05-03 NOTE — Progress Notes (Signed)
[facility], [place] 05/03/18  Subjective:  Reports a bit of abdominal pain today. Due for dialysis again tomorrow. Resting comfortably in bed at the moment.   Objective:  Vital signs in last 24 hours:  Temp:  [99.6 F (37.6 C)-100.2 F (37.9 C)] 99.6 F (37.6 C) (09/30 0307) Pulse Rate:  [72-75] 72 (09/30 0307) Resp:  [18] 18 (09/30 0307) BP: (168-186)/(77) 168/77 (09/30 0307) SpO2:  [98 %-99 %] 99 % (09/30 0307) Weight:  [66.4 kg] 66.4 kg (09/30 0307)  Weight change: 6.406 kg Filed Weights   05/01/18 0904 05/02/18 0428 05/03/18 0307  Weight: 60 kg 66.7 kg 66.4 kg    Intake/Output:    Intake/Output Summary (Last 24 hours) at 05/03/2018 1222 Last data filed at 05/03/2018 0700 Gross per 24 hour  Intake 350 ml  Output 0 ml  Net 350 ml     Physical Exam: General: Chronically ill appearing  HEENT anicteric  Neck Supple,   Pulm/lungs Basilar rales, normal effort  CVS/Heart Regular, no rub  Abdomen:  Soft, NT, mild distension, BS present  Extremities: No edema  Neurologic: Alert, oriented, follows commands  Skin: No acute rashes  Access: Left arm aneusymal AVF       Basic Metabolic Panel:  Recent Labs  Lab 05/01/18 0917 05/01/18 1823 05/01/18 2350 05/03/18 0617  NA 132*  --  136 134*  K 4.9  --  3.8 5.0  CL 91*  --  92* 92*  CO2 26  --  29 26  GLUCOSE 123*  --  96 86  BUN 41*  --  18 35*  CREATININE 7.95*  --  4.21* 6.90*  CALCIUM 9.1  --  8.8* 9.0  MG  --   --   --  2.6*  PHOS  --  6.8*  --  6.8*     CBC: Recent Labs  Lab 05/01/18 0917 05/01/18 2350  WBC 5.3 7.8  NEUTROABS 4.1  --   HGB 12.2* 12.0*  HCT 35.5* 34.6*  MCV 98.7 96.2  PLT 118* 116*      Lab Results  Component Value Date   HEPBSAG Negative 01/06/2017      Microbiology:  Recent Results (from the past 240 hour(s))  Group A Strep by PCR     Status: None   Collection Time: 05/01/18  2:50 PM  Result Value Ref Range Status   Group A Strep  by PCR NOT DETECTED NOT DETECTED Final    Comment: Performed at [facility], [address] [place], [place] [address]  CULTURE, BLOOD (ROUTINE X 2) w Reflex to ID Panel     Status: None (Preliminary result)   Collection Time: 05/02/18  4:36 AM  Result Value Ref Range Status   Specimen Description BLOOD BLOOD RIGHT ARM  Final   Special Requests   Final    BOTTLES DRAWN AEROBIC AND ANAEROBIC Blood Culture adequate volume   Culture   Final    NO GROWTH <12 HOURS Performed at [facility], [address] [place], [place] [address]    Report Status PENDING  Incomplete  CULTURE, BLOOD (ROUTINE X 2) w Reflex to ID Panel     Status: None (Preliminary result)   Collection Time: 05/02/18  4:42 AM  Result Value Ref Range Status   Specimen Description BLOOD BLOOD RIGHT HAND  Final   Special Requests   Final    BOTTLES DRAWN AEROBIC AND ANAEROBIC Blood Culture adequate volume   Culture  Final    NO GROWTH <12 HOURS Performed at [facility], [address] [place], [place] [address]    Report Status PENDING  Incomplete    Coagulation Studies: No results for input(s): LABPROT, INR in the last 72 hours.  Urinalysis: No results for input(s): COLORURINE, LABSPEC, PHURINE, GLUCOSEU, HGBUR, BILIRUBINUR, KETONESUR, PROTEINUR, UROBILINOGEN, NITRITE, LEUKOCYTESUR in the last 72 hours.  Invalid input(s): APPERANCEUR    Imaging: US Abdomen Complete  Result Date: 05/02/2018 CLINICAL DATA:  Epigastric/abdominal pain. EXAM: ABDOMEN ULTRASOUND COMPLETE COMPARISON:  None. FINDINGS: Gallbladder: The gallbladder wall is borderline measuring 3.3 mm. No stones, sludge, or Murphy's sign. Common bile duct: Diameter: 5 mm Liver: Heterogeneous nodular appearance consistent with known cirrhosis. No focal mass. Portal vein is patent on color Doppler imaging with normal direction of blood flow towards the liver. IVC: No abnormality visualized. Pancreas: Not well visualized due to  shadowing bowel gas. Spleen: Splenomegaly with the spleen measuring 15 x 5.4 x 11.7 cm. Volume was not calculated. Right Kidney: Length: 7.4 cm.  Small echogenic kidney. Left Kidney: Length: 9.3 cm.  Echogenic kidney.  Small 8 mm cyst. Abdominal aorta: No aneurysm visualized. Other findings: Moderate ascites throughout the abdomen. IMPRESSION: 1. Moderate ascites. 2. The gallbladder wall is borderline in thickness, probably due to the cirrhosis. The gallbladder is otherwise normal. 3. Findings of cirrhosis. 4. Splenomegaly. 5. Echogenic kidneys consistent with medical renal disease. Electronically Signed   By: Dorise Bullion III M.D   On: 05/02/2018 14:27     Medications:   . cefTRIAXone (ROCEPHIN)  IV 2 g (05/03/18 0757)  . [START ON 05/04/2018] vancomycin     . benzonatate  200 mg Oral TID  . Chlorhexidine Gluconate Cloth  6 each Topical Q0600  . heparin  5,000 Units Subcutaneous Q8H  . nadolol  20 mg Oral QHS  . pantoprazole  40 mg Oral BID   acetaminophen, guaiFENesin-dextromethorphan, heparin, hydrALAZINE, morphine injection, ondansetron **OR** ondansetron (ZOFRAN) IV, promethazine  Assessment/ Plan:  47 y.o. hispanic male with end stage renal disease on hemodialysis, hypertension, hepatic alcoholic cirrhosis, congestive heart failure  [facility] [place] [facility]TS  1. ESRD 2. AOCKD 3. Acute pulm edema 4. Nausea and vomiting 5. SHPTH  Plan: Patient due for hemodialysis again tomorrow.  No urgent indication today as he appears to be breathing comfortably.  Hemoglobin currently septal.  Hold off on Epogen at this time.  Phosphorus to high at 6.8.  He was on a rake see a 2 tablets by mouth 3 times a day with meals.  We will go and restart this at the moment.  Recheck serum phosphorus tomorrow.   LOS: 2 Chantele Corado 9/30/201912:22 PM  [place], [place]  Note: This note was prepared with Dragon dictation. Any transcription  errors are unintentional

## 2018-05-03 NOTE — Progress Notes (Signed)
Per prime doc, held 0600 dose of heparin dose due to possible paracentesis today.

## 2018-05-03 NOTE — Progress Notes (Signed)
Initial Nutrition Assessment  DOCUMENTATION CODES:   Non-severe (moderate) malnutrition in context of chronic illness  INTERVENTION:   Nepro Shake po BID, each supplement provides 425 kcal and 19 grams protein  Rena-vite daily  Vitamin C 500mg  po BID as pt with h/o scurvy  Thiamine 100mg  po daily r/t etoh abuse   NUTRITION DIAGNOSIS:   Moderate Malnutrition related to chronic illness(ESRD on HD, cirrhosis, etoh abuse ) as evidenced by moderate fat depletion, moderate muscle depletion.  GOAL:   Patient will meet greater than or equal to 90% of their needs  MONITOR:   PO intake, Supplement acceptance, Labs, Weight trends, Skin, I & O's  REASON FOR ASSESSMENT:   Malnutrition Screening Tool    ASSESSMENT:   47 y.o. Hispanic male with history of alcoholic cirrhosis, decompensated with ascites, scurvy, end-stage renal disease on hemodialysis admitted with 3 days history of nausea, vomiting and abdominal pain, weakness.     This pt is well known to RD from multiple previous admits. Pt with a h/o scurvy, is non complaint with vitamin C supplementation at home r/t financial reasons. Pt does not eat well at baseline and with h/o etoh abuse. Pt does like supplements and is always willing to drink these while hospitalized but does not continue them at home. Per chart, pt is weight stable pta. RD will order supplements and vitamins to help pt meet his estimated needs and replace losses from HD. Plan is for EGD tomorrow per GI note.   Medications reviewed and include: ferric citrate, heparin, protonix, ceftriaxone, vancomycin, zofran, phenergan   Labs reviewed: Na 134(L), K 5.0 wnl, BUN 35(H), creat 6.9(H), P 6.8(H), Mg 2.6(H)  NUTRITION - FOCUSED PHYSICAL EXAM:    Most Recent Value  Orbital Region  No depletion  Upper Arm Region  Moderate depletion  Thoracic and Lumbar Region  Moderate depletion  Buccal Region  No depletion  Temple Region  Mild depletion  Clavicle Bone Region   Moderate depletion  Clavicle and Acromion Bone Region  Moderate depletion  Scapular Bone Region  Moderate depletion  Dorsal Hand  Mild depletion  Patellar Region  Moderate depletion  Anterior Thigh Region  Moderate depletion  Posterior Calf Region  Moderate depletion  Edema (RD Assessment)  None  Hair  Reviewed  Eyes  Reviewed  Mouth  Reviewed  Skin  Reviewed  Nails  Reviewed     Diet Order:   Diet Order            Diet NPO time specified Except for: Sips with Meds  Diet effective midnight        Diet renal with fluid restriction Fluid restriction: 1200 mL Fluid; Room service appropriate? Yes; Fluid consistency: Thin  Diet effective now             EDUCATION NEEDS:   No education needs have been identified at this time  Skin:  Skin Assessment: Reviewed RN Assessment  Last BM:  9/27  Height:   Ht Readings from Last 1 Encounters:  05/01/18 5' 2.99" (1.6 m)    Weight:   Wt Readings from Last 1 Encounters:  05/03/18 66.4 kg    Ideal Body Weight:  56.3 kg  BMI:  Body mass index is 25.94 kg/m.  Estimated Nutritional Needs:   Kcal:  1700-2000kcal/day   Protein:  86-100g/day   Fluid:  UOP + 1L  Koleen Distance MS, RD, LDN Pager #- [phone] Office#- [phone] After Hours Pager: [phone]

## 2018-05-03 NOTE — Progress Notes (Signed)
Mario Antigua, MD [address] [place], [place], [place], [address] [address], [place], [place], [place], [address] Phone: [phone]  Fax: [phone]   Subjective: Patient resting in bed comfortably.  Spanish interpreter video line used for entire evaluation.  Patient reports to to 3/10 cramping generalized abdominal pain.  Also reports nausea and vomiting.  No hematemesis.  He is off-topic precautions.  He is hungry and would like to eat and is frustrated that he has not eaten.   Objective: Exam: Vital signs in last 24 hours: Vitals:   05/02/18 0539 05/02/18 0828 05/02/18 1952 05/03/18 0307  BP:  (!) 152/63 (!) 186/77 (!) 168/77  Pulse:  67 75 72  Resp:  19 18 18   Temp: 99.1 F (37.3 C) 98.4 F (36.9 C) 100.2 F (37.9 C) 99.6 F (37.6 C)  TempSrc: Oral Oral Oral Oral  SpO2:  100% 98% 99%  Weight:    66.4 kg  Height:       Weight change: 6.406 kg  Intake/Output Summary (Last 24 hours) at 05/03/2018 0910 Last data filed at 05/03/2018 0307 Gross per 24 hour  Intake 350 ml  Output 0 ml  Net 350 ml    General: No acute distress, AAO x3 Abd: Soft, NT/ND, No HSM Skin: Warm, no rashes Neck: Supple, Trachea midline   Lab Results: Lab Results  Component Value Date   WBC 7.8 05/01/2018   HGB 12.0 (L) 05/01/2018   HCT 34.6 (L) 05/01/2018   MCV 96.2 05/01/2018   PLT 116 (L) 05/01/2018   Micro Results: Recent Results (from the past 240 hour(s))  Group A Strep by PCR     Status: None   Collection Time: 05/01/18  2:50 PM  Result Value Ref Range Status   Group A Strep by PCR NOT DETECTED NOT DETECTED Final    Comment: Performed at [facility], [address] [place], [place] [address]  CULTURE, BLOOD (ROUTINE X 2) w Reflex to ID Panel     Status: None (Preliminary result)   Collection Time: 05/02/18  4:36 AM  Result Value Ref Range Status   Specimen Description BLOOD BLOOD RIGHT ARM  Final   Special Requests   Final    BOTTLES DRAWN  AEROBIC AND ANAEROBIC Blood Culture adequate volume   Culture   Final    NO GROWTH <12 HOURS Performed at [facility], [address] [place], [place] [address]    Report Status PENDING  Incomplete  CULTURE, BLOOD (ROUTINE X 2) w Reflex to ID Panel     Status: None (Preliminary result)   Collection Time: 05/02/18  4:42 AM  Result Value Ref Range Status   Specimen Description BLOOD BLOOD RIGHT HAND  Final   Special Requests   Final    BOTTLES DRAWN AEROBIC AND ANAEROBIC Blood Culture adequate volume   Culture   Final    NO GROWTH <12 HOURS Performed at [facility], [address], [place], [place] [address]    Report Status PENDING  Incomplete   Studies/Results: US Abdomen Complete  Result Date: 05/02/2018 CLINICAL DATA:  Epigastric/abdominal pain. EXAM: ABDOMEN ULTRASOUND COMPLETE COMPARISON:  None. FINDINGS: Gallbladder: The gallbladder wall is borderline measuring 3.3 mm. No stones, sludge, or Murphy's sign. Common bile duct: Diameter: 5 mm Liver: Heterogeneous nodular appearance consistent with known cirrhosis. No focal mass. Portal vein is patent on color Doppler imaging with normal direction of blood flow towards the liver. IVC: No abnormality visualized. Pancreas: Not  well visualized due to shadowing bowel gas. Spleen: Splenomegaly with the spleen measuring 15 x 5.4 x 11.7 cm. Volume was not calculated. Right Kidney: Length: 7.4 cm.  Small echogenic kidney. Left Kidney: Length: 9.3 cm.  Echogenic kidney.  Small 8 mm cyst. Abdominal aorta: No aneurysm visualized. Other findings: Moderate ascites throughout the abdomen. IMPRESSION: 1. Moderate ascites. 2. The gallbladder wall is borderline in thickness, probably due to the cirrhosis. The gallbladder is otherwise normal. 3. Findings of cirrhosis. 4. Splenomegaly. 5. Echogenic kidneys consistent with medical renal disease. Electronically Signed   By: Dorise Bullion III M.D   On: 05/02/2018 14:27   Ct Abdomen Pelvis W  Contrast  Result Date: 05/01/2018 CLINICAL DATA:  Abdominal pain and vomiting. EXAM: CT ABDOMEN AND PELVIS WITH CONTRAST TECHNIQUE: Multidetector CT imaging of the abdomen and pelvis was performed using the standard protocol following bolus administration of intravenous contrast. CONTRAST:  28mL OMNIPAQUE IOHEXOL 300 MG/ML  SOLN COMPARISON:  August 20, 2017 FINDINGS: Lower chest: There is apparent chronic atelectasis in the posterior left base. Lung bases otherwise are clear. There are multiple esophageal varices. The azygos and hemiazygous veins are diffusely dilated and tortuous, unchanged in appearance. Hepatobiliary: The liver has a diffusely nodular contour, unchanged. There is diffuse dysplastic appearing change throughout the liver without well-defined mass evident. Portal vein is patent. The gallbladder wall does not appear appreciably thickened. There is no biliary duct dilatation. Pancreas: No evident pancreatic mass or inflammatory focus. Spleen: Spleen measures 15.6 x 12.9 x 5.9 cm with a measured splenic volume of 594 cubic cm. No focal splenic lesions are evident. Adrenals/Urinary Tract: Adrenals appear unremarkable bilaterally. Kidneys are atrophic bilaterally. There is extensive peripheral renal arterial calcification. There are cystic areas throughout the kidneys bilaterally consistent with chronic renal disease, stable compared to the previous study. Largest cyst is seen in the right kidney measuring 2.3 x 1.6 cm. There is no hydronephrosis on either side. There is no evident renal or ureteral calculus on either side. Urinary bladder is midline with diffuse wall thickening. There is calcification in the anterior aspect of the urinary bladder wall, stable. Stomach/Bowel: There is no appreciable bowel wall or mesenteric thickening. No evident bowel obstruction. No free air or portal venous air. There is moderate stool throughout the colon. Vascular/Lymphatic: There is aortoiliac atherosclerosis.  No aneurysm evident. There are varices throughout the upper abdomen, primarily perisplenic. There are perirectal varices as well. There is also periprostatic variceal change. There are no thrombosed vessels evident. Note that there is calcification in portions of the inferior vena cava and common femoral veins. There is recanalization of the umbilical vein with multiple varices immediately inferior to the rectus muscle throughout the anterior abdominal wall. Reproductive: Prostate and seminal vesicles appear normal in size and contour. No evident pelvic mass. Other: There is extensive ascites throughout the abdomen and pelvis. There is no evident abscess in the abdomen pelvis. Appendix appears normal. Musculoskeletal: Bones show evidence of secondary hyperparathyroidism due to chronic renal failure. There are multiple Schmorl's nodes in the lumbar region. There is evidence of sacroiliitis bilaterally. There is also evidence of erosive change along the left pubic symphysis, stable. No intramuscular lesions are evident. IMPRESSION: 1. Changes of hepatic cirrhosis with diffuse dysplastic change in the liver, stable. 2.  Splenomegaly. 3. Extensive varices at multiple sites as summarized above. Tortuosity and dilatation of the azygos and hemiazygous veins is again noted. There is calcification in the inferior vena cava and common femoral veins, likely due  to chronic renal failure. 4. Atrophic kidneys with multiple renal cysts, consistent with chronic renal failure. No hydronephrosis. No renal or ureteral calculi. 5. Urinary bladder wall thickening concerning for cystitis. Chronic focus of calcification in the anterior aspect of the urinary bladder, potentially due to prior focus of infection. No air seen in the urinary bladder currently. 6.  Aortoiliac atherosclerosis. 7. Bony changes of secondary hyperparathyroidism, a finding likely due to chronic renal failure. There is stable bilateral sacroiliitis as well as erosive  change and remodeling in the left pubic symphysis, likely due to chronic secondary hyperparathyroidism. 8. No abscess evident in the abdomen or pelvis. Appendix appears normal. No bowel obstruction. Aortic Atherosclerosis (ICD10-I70.0). Electronically Signed   By: Lowella Grip III M.D.   On: 05/01/2018 11:12   Dg Chest Port 1 View  Result Date: 05/01/2018 CLINICAL DATA:  Vomiting for 3 days with generalized pain and weakness. EXAM: PORTABLE CHEST 1 VIEW COMPARISON:  10/01/2017. FINDINGS: Trachea is midline. Heart is enlarged. Diffuse mixed interstitial and airspace opacification. No pleural fluid. IMPRESSION: Pulmonary edema. Electronically Signed   By: Lorin Picket M.D.   On: 05/01/2018 10:59   Medications:  Scheduled Meds: . benzonatate  200 mg Oral TID  . Chlorhexidine Gluconate Cloth  6 each Topical Q0600  . heparin  5,000 Units Subcutaneous Q8H  . nadolol  20 mg Oral QHS  . pantoprazole  40 mg Oral BID   Continuous Infusions: . cefTRIAXone (ROCEPHIN)  IV 2 g (05/03/18 0757)  . [START ON 05/04/2018] vancomycin     PRN Meds:.acetaminophen, guaiFENesin-dextromethorphan, heparin, hydrALAZINE, morphine injection, ondansetron **OR** ondansetron (ZOFRAN) IV, promethazine   Assessment: Active Problems:   Intractable abdominal pain    Plan: Patient is now off droplet precautions We discussed the plan for EGD, which would be either later in the evening today at the earliest or tomorrow Patient really wants to eat today and does not want to wait till the EGD possibly later in the evening today, and would rather have it done tomorrow. This was discussed with Dr. Bobbye Charleston We will plan for EGD tomorrow N.p.o. past midnight Patient is on PPI p.o. twice daily due to his abdominal pain which is appropriate Continue to avoid NSAIDs  Follow-up as outpatient for cirrhosis No hematemesis, confusion or alarm symptoms present otherwise  I have discussed alternative options, risks &  benefits,  which include, but are not limited to, bleeding, infection, perforation,respiratory complication & drug reaction.  The patient agrees with this plan & written consent will be obtained.      LOS: 2 days   Mario Antigua, MD 05/03/2018, 9:10 AM

## 2018-05-03 NOTE — Progress Notes (Signed)
PT Cancellation Note  Patient Details Name: Mario [name] MRN: [id number] DOB: Mar 05, 1971   Cancelled Treatment:    Reason Eval/Treat Not Completed: Patient at procedure or test/unavailable.  Order received.  Chart reviewed.  Pt away at procedure and will need time to recover before participating with PT.  Will re-attempt 05/04/2018 in the morning.   Roxanne Gates, PT, DPT 05/03/2018, 2:42 PM

## 2018-05-03 NOTE — Progress Notes (Signed)
Patient ID: Mario [name] Mario [name], male   DOB: 09/08/1970, 47 y.o.   MRN: [id number]  Mario [name] BHA:[id number] DOB: [birth year] DOA: 05/01/2018 PCP: [name], MD  HPI/Subjective: Patient still having some abdominal pain and nausea.  States his face is still has some pain.  Has burning feeling in his chest.  No bowel movement in a few days  Objective: Vitals:   05/03/18 1318 05/03/18 1403  BP: 135/67 (!) 151/77  Pulse: 67 65  Resp:    Temp:    SpO2: 100% 99%    Filed Weights   05/01/18 0904 05/02/18 0428 05/03/18 0307  Weight: 60 kg 66.7 kg 66.4 kg    ROS: Review of Systems  Constitutional: Positive for malaise/fatigue. Negative for chills and fever.  Eyes: Negative for blurred vision.  Respiratory: Negative for cough and shortness of breath.   Cardiovascular: Negative for chest pain.  Gastrointestinal: Positive for abdominal pain and nausea. Negative for constipation, diarrhea and vomiting.  Genitourinary: Negative for dysuria.  Musculoskeletal: Negative for joint pain.  Neurological: Negative for dizziness and headaches.   Exam: Physical Exam  Constitutional: He is oriented to person, place, and time.  HENT:  Nose: No mucosal edema.  Mouth/Throat: No oropharyngeal exudate or posterior oropharyngeal edema.  Eyes: Pupils are equal, round, and reactive to light. Conjunctivae, EOM and lids are normal.  Neck: No JVD present. Carotid bruit is not present. No edema present. No thyroid mass and no thyromegaly present.  Cardiovascular: S1 normal and S2 normal. Exam reveals no gallop.  No murmur heard. Pulses:      Dorsalis pedis pulses are 2+ on the right side, and 2+ on the left side.  Respiratory: No respiratory distress. He has decreased breath sounds in the right lower field and the left lower field. He has no wheezes. He has no rhonchi. He has no rales.  GI: Soft. Bowel sounds are normal. There is no tenderness.   Musculoskeletal:       Right ankle: He exhibits swelling.       Left ankle: He exhibits swelling.  Lymphadenopathy:    He has no cervical adenopathy.  Neurological: He is alert and oriented to person, place, and time. No cranial nerve deficit.  Skin: Skin is warm. No rash noted. Nails show no clubbing.  Dried skin lesions face, bilateral cheek and left forehead  Psychiatric: He has a normal mood and affect.      Data Reviewed: Basic Metabolic Panel: Recent Labs  Lab 05/01/18 0917 05/01/18 1823 05/01/18 2350 05/03/18 0617  NA 132*  --  136 134*  K 4.9  --  3.8 5.0  CL 91*  --  92* 92*  CO2 26  --  29 26  GLUCOSE 123*  --  96 86  BUN 41*  --  18 35*  CREATININE 7.95*  --  4.21* 6.90*  CALCIUM 9.1  --  8.8* 9.0  MG  --   --   --  2.6*  PHOS  --  6.8*  --  6.8*   Liver Function Tests: Recent Labs  Lab 05/01/18 0917 05/01/18 2350  AST 28 24  ALT 16 15  ALKPHOS 149* 141*  BILITOT 1.3* 1.5*  PROT 7.4 7.0  ALBUMIN 3.7 3.4*   Recent Labs  Lab 05/01/18 0917  LIPASE 41   CBC: Recent Labs  Lab 05/01/18 0917 05/01/18 2350  WBC 5.3 7.8  NEUTROABS 4.1  --   HGB 12.2*  12.0*  HCT 35.5* 34.6*  MCV 98.7 96.2  PLT 118* 116*   Cardiac Enzymes: Recent Labs  Lab 05/01/18 0917 05/01/18 1823 05/01/18 2354 05/02/18 0436  TROPONINI 0.03* 0.03* 0.03* 0.03*     Recent Results (from the past 240 hour(s))  Group A Strep by PCR     Status: None   Collection Time: 05/01/18  2:50 PM  Result Value Ref Range Status   Group A Strep by PCR NOT DETECTED NOT DETECTED Final    Comment: Performed at [facility], [address] [place], [place] [address]  CULTURE, BLOOD (ROUTINE X 2) w Reflex to ID Panel     Status: None (Preliminary result)   Collection Time: 05/02/18  4:36 AM  Result Value Ref Range Status   Specimen Description BLOOD BLOOD RIGHT ARM  Final   Special Requests   Final    BOTTLES DRAWN AEROBIC AND ANAEROBIC Blood Culture adequate volume    Culture   Final    NO GROWTH <12 HOURS Performed at [facility], [address] [place], [place] [address]    Report Status PENDING  Incomplete  CULTURE, BLOOD (ROUTINE X 2) w Reflex to ID Panel     Status: None (Preliminary result)   Collection Time: 05/02/18  4:42 AM  Result Value Ref Range Status   Specimen Description BLOOD BLOOD RIGHT HAND  Final   Special Requests   Final    BOTTLES DRAWN AEROBIC AND ANAEROBIC Blood Culture adequate volume   Culture   Final    NO GROWTH <12 HOURS Performed at [name], [address], [place], [place] [address]    Report Status PENDING  Incomplete     Studies: US Abdomen Complete  Result Date: 05/02/2018 CLINICAL DATA:  Epigastric/abdominal pain. EXAM: ABDOMEN ULTRASOUND COMPLETE COMPARISON:  None. FINDINGS: Gallbladder: The gallbladder wall is borderline measuring 3.3 mm. No stones, sludge, or Murphy's sign. Common bile duct: Diameter: 5 mm Liver: Heterogeneous nodular appearance consistent with known cirrhosis. No focal mass. Portal vein is patent on color Doppler imaging with normal direction of blood flow towards the liver. IVC: No abnormality visualized. Pancreas: Not well visualized due to shadowing bowel gas. Spleen: Splenomegaly with the spleen measuring 15 x 5.4 x 11.7 cm. Volume was not calculated. Right Kidney: Length: 7.4 cm.  Small echogenic kidney. Left Kidney: Length: 9.3 cm.  Echogenic kidney.  Small 8 mm cyst. Abdominal aorta: No aneurysm visualized. Other findings: Moderate ascites throughout the abdomen. IMPRESSION: 1. Moderate ascites. 2. The gallbladder wall is borderline in thickness, probably due to the cirrhosis. The gallbladder is otherwise normal. 3. Findings of cirrhosis. 4. Splenomegaly. 5. Echogenic kidneys consistent with medical renal disease. Electronically Signed   By: Dorise Bullion III M.D   On: 05/02/2018 14:27    Scheduled Meds: . benzonatate  200 mg Oral TID  . Chlorhexidine Gluconate  Cloth  6 each Topical Q0600  . feeding supplement (NEPRO CARB STEADY)  237 mL Oral BID BM  . ferric citrate  420 mg Oral TID WC  . heparin  5,000 Units Subcutaneous Q8H  . multivitamin  1 tablet Oral QHS  . nadolol  20 mg Oral QHS  . pantoprazole  40 mg Oral BID  . thiamine  100 mg Oral Daily  . vitamin C  500 mg Oral BID   Continuous Infusions: . cefTRIAXone (ROCEPHIN)  IV 2 g (05/03/18 0757)  . [START ON 05/04/2018] vancomycin      Assessment/Plan:  1. Epigastric abdominal pain, nausea and vomiting.  GI consult appreciated.  EGD tomorrow. 2. Fluid overload, chronic systolic chf improved after dialysis.  Dialysis again tomorrow. 3. Skin lesions face and bilateral parotitis.  Continue vancomycin and rocephin.  Add Decadron 4. Cirrhosis with ascites. Varices seen on ct scan. Send hepatitis profiles.  US paracentesis for today.  Empiric antibiotics 5. ESRD on Dialysis T/TH/SA 6. Weakness.  Physical therapy evaluation  Code Status:     Code Status Orders  (From admission, onward)         Start     Ordered   05/01/18 1758  Full code  Continuous     05/01/18 1757        Code Status History    Date Active Date Inactive Code Status Order ID Comments User Context   10/02/2017 0737 10/02/2017 1659 Full Code [id number]  Gorden Harms, MD Inpatient   08/21/2017 0208 08/23/2017 1621 Full Code [id number]  Amelia Jo, MD ED   06/09/2017 2048 06/13/2017 1907 Full Code [id number]  Dustin Flock, MD Inpatient   01/06/2017 1437 01/07/2017 1744 Full Code [id number]  Epifanio Lesches, MD ED   11/27/2016 1431 11/30/2016 1423 Full Code [id number]  Henreitta Leber, MD Inpatient   10/28/2016 1611 10/31/2016 1638 Full Code [id number]  Epifanio Lesches, MD ED   10/13/2016 2136 10/14/2016 2123 Full Code [id number]  Lance Coon, MD Inpatient   08/28/2016 1658 08/30/2016 1719 Full Code [id number]  Fritzi Mandes, MD ED   07/03/2016 2006 07/06/2016 1622 Full Code [id number]  Fritzi Mandes, MD ED   04/11/2016 1857  04/12/2016 0716 Full Code [id number]  Vaughan Basta, MD Inpatient   01/25/2015 1229 01/28/2015 1626 Full Code [id number]  Dustin Flock, MD Inpatient      Disposition Plan: TBD  Consultants:  Nephrology  Gastroenterology  Antibiotics:  Vancomycin, rocephin  Time spent: 28 minutes. seen with translator and nursing  [employer]

## 2018-05-04 ENCOUNTER — Telehealth: Payer: Self-pay | Admitting: Pharmacy Technician

## 2018-05-04 DIAGNOSIS — R1013 Epigastric pain: Secondary | ICD-10-CM

## 2018-05-04 LAB — HEPATITIS B CORE ANTIBODY, TOTAL: Hep B Core Total Ab: NEGATIVE

## 2018-05-04 LAB — HEPATITIS B SURFACE ANTIGEN: HEP B S AG: NEGATIVE

## 2018-05-04 LAB — HEPATITIS C ANTIBODY: HCV Ab: <0.1 s/co ratio (ref 0.0–0.9)

## 2018-05-04 LAB — HEPATITIS B SURFACE ANTIBODY, QUANTITATIVE

## 2018-05-04 LAB — MUMPS ANTIBODY, IGM

## 2018-05-04 MED ORDER — CIPROFLOXACIN HCL 250 MG PO TABS: ORAL_TABLET | ORAL | 0 refills | Status: DC

## 2018-05-04 MED ORDER — NADOLOL 20 MG PO TABS: 20.0000 mg | ORAL_TABLET | Freq: Every day | ORAL | 0 refills | Status: DC

## 2018-05-04 MED ORDER — CLINDAMYCIN HCL 300 MG PO CAPS: 300.0000 mg | ORAL_CAPSULE | Freq: Four times a day (QID) | ORAL | 0 refills | Status: DC

## 2018-05-04 MED ORDER — PREDNISONE 10 MG PO TABS: ORAL_TABLET | ORAL | 0 refills | Status: DC

## 2018-05-04 MED ORDER — ALBUTEROL SULFATE HFA 108 (90 BASE) MCG/ACT IN AERS: 2.0000 | INHALATION_SPRAY | Freq: Four times a day (QID) | RESPIRATORY_TRACT | 0 refills | Status: DC | PRN

## 2018-05-04 MED ORDER — SODIUM CHLORIDE 0.9 % IV SOLN: INTRAVENOUS | Status: DC

## 2018-05-04 MED ORDER — NEPRO/CARBSTEADY PO LIQD: 237.0000 mL | Freq: Two times a day (BID) | ORAL | 0 refills | Status: DC

## 2018-05-04 MED ORDER — LISINOPRIL 5 MG PO TABS: 5.0000 mg | ORAL_TABLET | Freq: Every day | ORAL | 0 refills | Status: DC

## 2018-05-04 MED ORDER — CLINDAMYCIN HCL 150 MG PO CAPS
300.0000 mg | ORAL_CAPSULE | Freq: Four times a day (QID) | ORAL | Status: DC
  Filled 2018-05-04: qty 2

## 2018-05-04 MED ORDER — CIPROFLOXACIN HCL 500 MG PO TABS
250.0000 mg | ORAL_TABLET | Freq: Every day | ORAL | Status: DC
  Filled 2018-05-04: qty 0.5

## 2018-05-04 NOTE — Care Management Note (Signed)
Case Management Note  Patient Details  Name: Mario [name] MRN: [id number] Date of Birth: [birth year]  Subjective/Objective:     Patient admitted with abdominal pain.  He has ESRD on dialysis at [facility]. He is also a chronic alcoholic.  Uninsured with emergency Medicaid for dialysis only.  Medications are not covered.   Not a legal resident of the [place].   SW at his dialysis clinic has resigned as of yesterday.   Patient lives with family and friends.  Denies issues with transportation.  [facility] has agreed to fill his prescriptions for inhaler and antibiotics.  Patient states he plans to leave here and go straight there to get medicines.   Prescriptions faxed to [facility].              Action/Plan: Explained importance of picking up medications at discharge.   Expected Discharge Date:  05/04/18               Expected Discharge Plan:  Home/Self Care  In-House Referral:     Discharge planning Services  CM Consult, Medication Assistance  Post Acute Care Choice:    Choice offered to:     DME Arranged:    DME Agency:     HH Arranged:    HH Agency:     Status of Service:  Completed, signed off  If discussed at [employer] of Stay Meetings, dates discussed:    Additional Comments:  Elza Rafter, RN 05/04/2018, 2:51 PM

## 2018-05-04 NOTE — Clinical Social Work Note (Signed)
CSW received consult that patient needs transportation assistance, CSW included transportation service in Spanish with patient's discharge paperwork.  CSW signing off, please reconsult if social work needs arise.  Jones Broom. [place], MSW, [place]  05/04/2018 4:00 PM

## 2018-05-04 NOTE — Plan of Care (Signed)

## 2018-05-04 NOTE — Care Management (Signed)
Mario [name] with [facility] relayed that the clinic also has not found any evidence that patient has full medicaid coverage.  Informed that  [facility] could fill patient's prednisone, clindamycin, cipro and ventolin.  Does not have Nadolol on hand.  This medication  need for the patient's portal hypertension so there is no substitute. Marland Kitchen He was instructed of location of [facility] to to proceed immediately at discharge. CM was attempting to complete MATCH referral so could obtain this med and he left before CM could finish.  It is doubtful that he will attempt to get the medication filled at a local pharmacy

## 2018-05-04 NOTE — Care Management Note (Signed)
Case Management Note  Patient Details  Name: Mario [name] MRN: [id number] Date of Birth: [birth year]  Subjective/Objective:                 Patient has informed staff that he can not pay for his medicine. He does not meet [place] residency requirements.  He has emergency medicaid for dialysis.  Spoke with Dialysis clinic social worker and informed that patient's emergency medicaid does not  cover medications.  Clinic social worker has been trying to assist patient with meds at a reduce rate but  "patient does not have any money." Clinic has been "working with him for 2 years regarding meds. He does not participate."  Patient does not have a payor listed on [place] account.  Spoke with [facility] regarding lack of payor and agency says that if patient has medicaid, agency is not able to assist with medications.  Relayed the conversations with the social worker at the dialysis clinic.  Discussed that patient does not have a payor for this hospital stay.  Inez Catalina will investigate this further.   Action/Plan:  Faxed discharge prescriptions for Cipro, Clindamycin and Nadolol, albuterol inhaler to clinic.  Expected Discharge Date:  05/03/18               Expected Discharge Plan:     In-House Referral:     Discharge planning Services     Post Acute Care Choice:    Choice offered to:     DME Arranged:    DME Agency:     HH Arranged:    HH Agency:     Status of Service:     If discussed at [employer] of [employer], dates discussed:    Additional Comments:  Katrina Stack, RN 05/04/2018, 2:34 PM

## 2018-05-04 NOTE — Progress Notes (Signed)
Patient given discharge instructions and instructed to go to the med management clinic to pick up his medications. IV taken out and tele monitor off. Patient verbalized understanding with no further questions. Patient wheeled down in wheelchair.

## 2018-05-04 NOTE — Progress Notes (Signed)
Pre hd 

## 2018-05-04 NOTE — Telephone Encounter (Signed)
Lilia Pro contacted [facility] about providing medication assistance for patient.  Stated patient had emergency Medicaid.  Spoke with Denman George at [facility].  Denman George stated that since patient was undocumented, Medicaid would only cover life sustaining procedures such as Dialysis.  Patient does not have full Medicaid.  Therefore, his emergency Medicaid does not provide assistance with medications.    Giles to provide medication assistance for Cipro, Prednisone, Albuterol Inhaler & Clindamycin.  Nann to try to use MATCH program to help patient obtain Nadolol.  [facility]

## 2018-05-04 NOTE — Discharge Summary (Signed)
[facility] NAME: Flint Hakeem    MR#:  [id number]  DATE OF BIRTH:  October 17, 1970  DATE OF ADMISSION:  05/01/2018 ADMITTING PHYSICIAN: Sela Hua, MD  DATE OF DISCHARGE: 05/04/2018  PRIMARY CARE PHYSICIAN: [name], MD    ADMISSION DIAGNOSIS:  Wheezing [R06.2] Acute pulmonary edema ([place]) [J81.0] Parotid swelling [R60.9] Upper abdominal pain [R10.10] Acute superficial gastritis without hemorrhage [K29.00] Non-intractable vomiting, presence of nausea not specified, unspecified vomiting type [R11.10]  DISCHARGE DIAGNOSIS:  Active Problems:   Intractable abdominal pain   SECONDARY DIAGNOSIS:   Past Medical History:  Diagnosis Date  . Abdominal pain 08/28/2016  . Acute hyperkalemia 10/28/2016  . Chest pain 04/11/2016  . Chronic combined systolic and diastolic CHF (congestive heart failure) ([place])   . Cirrhosis ([place])   . Dialysis patient ([place])   . ESRD (end stage renal disease) on dialysis ([place]) 01/25/2015  . Fluid overload 04/12/2016  . HTN (hypertension) 10/13/2016  . Hyperkalemia 11/27/2016  . Hypertension   . Pulmonary edema 07/03/2016  . Renal disorder   . Renal insufficiency   . Scrotal infection 01/25/2015    HOSPITAL COURSE:   1.  Spontaneous bacterial peritonitis.  The patient was placed on antibiotics.  White blood cell count and paracentesis was elevated despite given 2 days of antibiotics before the procedure.  Patient's abdominal pain has improved.  Still has some nausea but no vomiting.  3.5 L was taken off for paracentesis and so far cultures are negative.  The patient was given IV Rocephin while here in the hospital and be switched over to Cipro 500 mg nightly for 3 nights then 250 daily prophylactically. 2.  Fluid overload, chronic systolic congestive heart failure improved after dialysis.  Dialysis to manage fluid.  Patient limited with medications secondary to cost.  Beta-blocker prescribed.  Aldactone  contraindicated with kidney failure. Trial of lisinopril. 3.  Skin lesions on the face and bilateral parotiditis.  The patient was given IV vancomycin while here and will be given clindamycin upon discharge home.  The swelling has subsided quite a bit.  The patient was also given Decadron while here and give a quick prednisone taper.  Can refer to ENT as outpatient.  If skin lesions do not heal can refer to dermatology. 4.  Cirrhosis of the liver with ascites.  Hepatitis profile is negative.  Patient was a drinker in the past.  Paracentesis removed 3.5 L.  The patient was given antibiotics.  Patient was also prescribed nadolol but I am not sure if he is going to be able to afford it. 5.  End-stage renal disease on dialysis Tuesday Thursday and Saturday 6.  Weakness.  Patient wanted to go home.  I ordered physical therapy evaluation but they were unable to see him in the room.  DISCHARGE CONDITIONS:   Satisfactory  CONSULTS OBTAINED:  Treatment Team:  Lin Landsman, MD  DRUG ALLERGIES:   Allergies  Allergen Reactions  . Betadine [Povidone Iodine] Rash    DISCHARGE MEDICATIONS:   Allergies as of 05/04/2018      Reactions   Betadine [povidone Iodine] Rash      Medication List    STOP taking these medications   oxyCODONE-acetaminophen 5-325 MG tablet Commonly known as:  PERCOCET/ROXICET     TAKE these medications   albuterol 108 (90 Base) MCG/ACT inhaler Commonly known as:  PROVENTIL HFA;VENTOLIN HFA Inhale 2 puffs into the lungs every 6 (six) hours as  needed for wheezing or shortness of breath.   AURYXIA 1 GM 210 MG(Fe) tablet Generic drug:  ferric citrate Take 420 mg by mouth 3 (three) times daily with meals.   ciprofloxacin 250 MG tablet Commonly known as:  CIPRO Two tablets nightly for three night then one tablet nightly afterwards   clindamycin 300 MG capsule Commonly known as:  CLEOCIN Take 1 capsule (300 mg total) by mouth every 6 (six) hours.   feeding  supplement (NEPRO CARB STEADY) Liqd Take 237 mLs by mouth 2 (two) times daily between meals.   lisinopril 5 MG tablet Commonly known as:  PRINIVIL,ZESTRIL Take 1 tablet (5 mg total) by mouth daily.   nadolol 20 MG tablet Commonly known as:  CORGARD Take 1 tablet (20 mg total) by mouth at bedtime.   predniSONE 10 MG tablet Commonly known as:  DELTASONE Three tabs po day1; two tabs po day2; one tab po day 3,4,5        DISCHARGE INSTRUCTIONS:   Follow-up Dr. Holley Raring nephrology and dialysis as scheduled  If you experience worsening of your admission symptoms, develop shortness of breath, life threatening emergency, suicidal or homicidal thoughts you must seek medical attention immediately by calling 911 or calling your MD immediately  if symptoms less severe.  You Must read complete instructions/literature along with all the possible adverse reactions/side effects for all the Medicines you take and that have been prescribed to you. Take any new Medicines after you have completely understood and accept all the possible adverse reactions/side effects.   Please note  You were cared for by a hospitalist during your hospital stay. If you have any questions about your discharge medications or the care you received while you were in the hospital after you are discharged, you can call the unit and asked to speak with the hospitalist on call if the hospitalist that took care of you is not available. Once you are discharged, your primary care physician will handle any further medical issues. Please note that NO REFILLS for any discharge medications will be authorized once you are discharged, as it is imperative that you return to your primary care physician (or establish a relationship with a primary care physician if you do not have one) for your aftercare needs so that they can reassess your need for medications and monitor your lab values.    Today   CHIEF COMPLAINT:   Chief Complaint   Patient presents with  . Weakness  . Emesis    HISTORY OF PRESENT ILLNESS:  Mario [name]  is a 47 y.o. male with a known history of end-stage renal disease and cirrhosis presents with weakness and vomiting and abdominal pain and shortness of breath   VITAL SIGNS:  Blood pressure (!) 182/72, pulse 65, temperature 97.6 F (36.4 C), resp. rate 16, height 5' 2.99" (1.6 m), weight 61.5 kg, SpO2 100 %.  PHYSICAL EXAMINATION:  GENERAL:  47 y.o.-year-old patient lying in the bed with no acute distress.  EYES: Pupils equal, round, reactive to light and accommodation. No scleral icterus. Extraocular muscles intact.  HEENT: Head atraumatic, normocephalic. Oropharynx and nasopharynx clear.  NECK:  Supple, no jugular venous distention. No thyroid enlargement, no tenderness.  LUNGS: Normal breath sounds bilaterally, no wheezing, rales,rhonchi or crepitation. No use of accessory muscles of respiration.  CARDIOVASCULAR: S1, S2 normal. No murmurs, rubs, or gallops.  ABDOMEN: Soft, non-tender, non-distended. Bowel sounds present. No organomegaly or mass.  EXTREMITIES: No pedal edema, cyanosis, or clubbing.  NEUROLOGIC:  Cranial nerves II through XII are intact. Muscle strength 5/5 in all extremities. Sensation intact. Gait not checked.  PSYCHIATRIC: The patient is alert and oriented x 3.  SKIN: No obvious rash, lesion, or ulcer.   DATA REVIEW:   CBC Recent Labs  Lab 05/01/18 2350  WBC 7.8  HGB 12.0*  HCT 34.6*  PLT 116*    Chemistries  Recent Labs  Lab 05/01/18 2350 05/03/18 0617  NA 136 134*  K 3.8 5.0  CL 92* 92*  CO2 29 26  GLUCOSE 96 86  BUN 18 35*  CREATININE 4.21* 6.90*  CALCIUM 8.8* 9.0  MG  --  2.6*  AST 24  --   ALT 15  --   ALKPHOS 141*  --   BILITOT 1.5*  --     Cardiac Enzymes Recent Labs  Lab 05/02/18 0436  TROPONINI 0.03*    Microbiology Results  Results for orders placed or performed during the hospital encounter of 05/01/18  Group A Strep by PCR      Status: None   Collection Time: 05/01/18  2:50 PM  Result Value Ref Range Status   Group A Strep by PCR NOT DETECTED NOT DETECTED Final    Comment: Performed at [facility], [address] [place], [place] [address]  CULTURE, BLOOD (ROUTINE X 2) w Reflex to ID Panel     Status: None (Preliminary result)   Collection Time: 05/02/18  4:36 AM  Result Value Ref Range Status   Specimen Description BLOOD BLOOD RIGHT ARM  Final   Special Requests   Final    BOTTLES DRAWN AEROBIC AND ANAEROBIC Blood Culture adequate volume   Culture   Final    NO GROWTH 2 DAYS Performed at [facility], [address], [place], [place] [address]    Report Status PENDING  Incomplete  CULTURE, BLOOD (ROUTINE X 2) w Reflex to ID Panel     Status: None (Preliminary result)   Collection Time: 05/02/18  4:42 AM  Result Value Ref Range Status   Specimen Description BLOOD BLOOD RIGHT HAND  Final   Special Requests   Final    BOTTLES DRAWN AEROBIC AND ANAEROBIC Blood Culture adequate volume   Culture   Final    NO GROWTH 2 DAYS Performed at [facility], [address] [place], [place] [address]    Report Status PENDING  Incomplete  Body fluid culture     Status: None (Preliminary result)   Collection Time: 05/03/18  1:45 PM  Result Value Ref Range Status   Specimen Description   Final    PLEURAL Performed at [facility], [address], [place], [place] [address]    Special Requests   Final    NONE Performed at [facility], [address] [place], [place] [address]    Gram Stain   Final    FEW WBC PRESENT, PREDOMINANTLY MONONUCLEAR NO ORGANISMS SEEN Performed at [facility], [place] [address] [place], [place] [address]    Culture PENDING  Incomplete   Report Status PENDING  Incomplete    RADIOLOGY:  US Paracentesis  Result Date: 05/03/2018 INDICATION: 47 year old male with cirrhosis and large volume ascites. EXAM: ULTRASOUND GUIDED   PARACENTESIS MEDICATIONS: None. COMPLICATIONS: None immediate. PROCEDURE: Informed written consent was obtained from the patient after a discussion of the risks, benefits and alternatives to treatment. A timeout was performed prior to the initiation of the procedure. Initial ultrasound scanning demonstrates a large  amount of ascites within the right lower abdominal quadrant. The right lower abdomen was prepped and draped in the usual sterile fashion. 1% lidocaine with epinephrine was used for local anesthesia. Following this, a 6 Fr Safe-T-Centesis catheter was introduced. An ultrasound image was saved for documentation purposes. The paracentesis was performed. The catheter was removed and a dressing was applied. The patient tolerated the procedure well without immediate post procedural complication. FINDINGS: A total of approximately 3500 mL of straw-colored ascitic fluid was removed. Samples were sent to the laboratory as requested by the clinical team. IMPRESSION: Successful ultrasound-guided paracentesis yielding 3.5 liters of peritoneal fluid. Electronically Signed   By: Jacqulynn Cadet M.D.   On: 05/03/2018 14:48      Management plans discussed with the patient, and he told me he wanted to go home.  CODE STATUS:     Code Status Orders  (From admission, onward)         Start     Ordered   05/01/18 1758  Full code  Continuous     05/01/18 1757        Code Status History    Date Active Date Inactive Code Status Order ID Comments User Context   10/02/2017 0737 10/02/2017 1659 Full Code [id number]  Gorden Harms, MD Inpatient   08/21/2017 0208 08/23/2017 1621 Full Code [id number]  Amelia Jo, MD ED   06/09/2017 2048 06/13/2017 1907 Full Code [id number]  Dustin Flock, MD Inpatient   01/06/2017 1437 01/07/2017 1744 Full Code [id number]  Epifanio Lesches, MD ED   11/27/2016 1431 11/30/2016 1423 Full Code [id number]  Henreitta Leber, MD Inpatient   10/28/2016 1611 10/31/2016 1638 Full Code  [id number]  Epifanio Lesches, MD ED   10/13/2016 2136 10/14/2016 2123 Full Code [id number]  Lance Coon, MD Inpatient   08/28/2016 1658 08/30/2016 1719 Full Code [id number]  Fritzi Mandes, MD ED   07/03/2016 2006 07/06/2016 1622 Full Code [id number]  Fritzi Mandes, MD ED   04/11/2016 1857 04/12/2016 0716 Full Code [id number]  Vaughan Basta, MD Inpatient   01/25/2015 1229 01/28/2015 1626 Full Code [id number]  Dustin Flock, MD Inpatient      TOTAL TIME TAKING CARE OF THIS PATIENT: 35 minutes.    Loletha Grayer M.D on 05/04/2018 at 4:17 PM  Between 7am to 6pm - Pager - [phone]  After 6pm go to [email or web address] - password [email or web address]  [employer] Office  [phone]  CC: Primary care physician; [name], MD

## 2018-05-04 NOTE — Progress Notes (Signed)
Mario Antigua, MD [address] [place], [place], [place], [address] [address], [place], [place], [place], [address] Phone: [phone]  Fax: [phone]   Subjective:  Patient continues to complain of cramping abdominal pain.  No hematemesis.  No melena or hematochezia  Objective: Exam: Vital signs in last 24 hours: Vitals:   05/04/18 1300 05/04/18 1315 05/04/18 1330 05/04/18 1401  BP: (!) 179/66 (!) 175/62 (!) 182/72 (!) 182/72  Pulse: 65 64 62 65  Resp: 18 19 17 16   Temp:    97.6 F (36.4 C)  TempSrc:      SpO2:   100% 100%  Weight:    61.5 kg  Height:       Weight change:   Intake/Output Summary (Last 24 hours) at 05/04/2018 1500 Last data filed at 05/04/2018 1401 Gross per 24 hour  Intake -  Output 2000 ml  Net -2000 ml    General: No acute distress, AAO x3 Abd: Soft, NT/ND, No HSM Skin: Warm, no rashes Neck: Supple, Trachea midline   Lab Results: Lab Results  Component Value Date   WBC 7.8 05/01/2018   HGB 12.0 (L) 05/01/2018   HCT 34.6 (L) 05/01/2018   MCV 96.2 05/01/2018   PLT 116 (L) 05/01/2018   Micro Results: Recent Results (from the past 240 hour(s))  Group A Strep by PCR     Status: None   Collection Time: 05/01/18  2:50 PM  Result Value Ref Range Status   Group A Strep by PCR NOT DETECTED NOT DETECTED Final    Comment: Performed at [facility], [address] [place], [place] [address]  CULTURE, BLOOD (ROUTINE X 2) w Reflex to ID Panel     Status: None (Preliminary result)   Collection Time: 05/02/18  4:36 AM  Result Value Ref Range Status   Specimen Description BLOOD BLOOD RIGHT ARM  Final   Special Requests   Final    BOTTLES DRAWN AEROBIC AND ANAEROBIC Blood Culture adequate volume   Culture   Final    NO GROWTH 2 DAYS Performed at [facility], [address] [place], [place] [address]    Report Status PENDING  Incomplete  CULTURE, BLOOD (ROUTINE X 2) w Reflex to ID Panel     Status: None  (Preliminary result)   Collection Time: 05/02/18  4:42 AM  Result Value Ref Range Status   Specimen Description BLOOD BLOOD RIGHT HAND  Final   Special Requests   Final    BOTTLES DRAWN AEROBIC AND ANAEROBIC Blood Culture adequate volume   Culture   Final    NO GROWTH 2 DAYS Performed at [facility], [address] [place], [place] [address]    Report Status PENDING  Incomplete  Body fluid culture     Status: None (Preliminary result)   Collection Time: 05/03/18  1:45 PM  Result Value Ref Range Status   Specimen Description   Final    PLEURAL Performed at [facility], [address] [place], [place] [address]    Special Requests   Final    NONE Performed at [facility], [address] [place], [place] [address]    Gram Stain   Final    FEW WBC PRESENT, PREDOMINANTLY MONONUCLEAR NO ORGANISMS SEEN Performed at [facility], [place] [address], [place],  [address]    Culture PENDING  Incomplete   Report Status PENDING  Incomplete   Studies/Results: US Paracentesis  Result Date:  05/03/2018 INDICATION: 47 year old male with cirrhosis and large volume ascites. EXAM: ULTRASOUND GUIDED  PARACENTESIS MEDICATIONS: None. COMPLICATIONS: None immediate. PROCEDURE: Informed written consent was obtained from the patient after a discussion of the risks, benefits and alternatives to treatment. A timeout was performed prior to the initiation of the procedure. Initial ultrasound scanning demonstrates a large amount of ascites within the right lower abdominal quadrant. The right lower abdomen was prepped and draped in the usual sterile fashion. 1% lidocaine with epinephrine was used for local anesthesia. Following this, a 6 Fr Safe-T-Centesis catheter was introduced. An ultrasound image was saved for documentation purposes. The paracentesis was performed. The catheter was removed and a dressing was applied. The patient tolerated the procedure well without  immediate post procedural complication. FINDINGS: A total of approximately 3500 mL of straw-colored ascitic fluid was removed. Samples were sent to the laboratory as requested by the clinical team. IMPRESSION: Successful ultrasound-guided paracentesis yielding 3.5 liters of peritoneal fluid. Electronically Signed   By: Jacqulynn Cadet M.D.   On: 05/03/2018 14:48   Medications:  Scheduled Meds: . benzonatate  200 mg Oral TID  . Chlorhexidine Gluconate Cloth  6 each Topical Q0600  . ciprofloxacin  250 mg Oral QHS  . clindamycin  300 mg Oral Q6H  . dexamethasone  10 mg Intravenous Q12H  . feeding supplement (NEPRO CARB STEADY)  237 mL Oral BID BM  . ferric citrate  420 mg Oral TID WC  . heparin  5,000 Units Subcutaneous Q8H  . multivitamin  1 tablet Oral QHS  . nadolol  20 mg Oral QHS  . pantoprazole  40 mg Oral BID  . thiamine  100 mg Oral Daily  . vitamin C  500 mg Oral BID   Continuous Infusions: . sodium chloride    . vancomycin 750 mg (05/04/18 1309)   PRN Meds:.acetaminophen, guaiFENesin-dextromethorphan, hydrALAZINE, ondansetron **OR** ondansetron (ZOFRAN) IV, oxyCODONE, promethazine   Assessment: Active Problems:   Intractable abdominal pain    Plan: EGD was planned for today However, anesthesia, Dr. Andree Elk did not want to proceed with EGD today, as patient had dialysis today and Dr. Andree Elk would like to wait for the procedure until tomorrow. Therefore, due to anesthesia's decision, EGD has been postponed to tomorrow Patient is tolerating oral diet N.p.o. past midnight for EGD tomorrow     LOS: 3 days   Mario Antigua, MD 05/04/2018, 3:00 PM

## 2018-05-04 NOTE — Progress Notes (Signed)
[facility], [place] 05/04/18  Subjective:  Patient seen at bedside. Spanish interpreter use. Patient wishes to be discharged after dialysis.   Objective:  Vital signs in last 24 hours:  Temp:  [97.4 F (36.3 C)-98.5 F (36.9 C)] 97.6 F (36.4 C) (10/01 1401) Pulse Rate:  [48-65] 65 (10/01 1401) Resp:  [12-20] 16 (10/01 1401) BP: (144-200)/(44-85) 182/72 (10/01 1401) SpO2:  [99 %-100 %] 100 % (10/01 1401) Weight:  [61.5 kg-63.5 kg] 61.5 kg (10/01 1401)  Weight change:  Filed Weights   05/03/18 0307 05/04/18 1020 05/04/18 1401  Weight: 66.4 kg 63.5 kg 61.5 kg    Intake/Output:    Intake/Output Summary (Last 24 hours) at 05/04/2018 1539 Last data filed at 05/04/2018 1401 Gross per 24 hour  Intake -  Output 2000 ml  Net -2000 ml     Physical Exam: General: Chronically ill appearing  HEENT anicteric  Neck Supple  Pulm/lungs CTAB, normal effort  CVS/Heart Regular, no rub  Abdomen:  Soft, NT, mild distension, BS present  Extremities: No edema  Neurologic: Alert, oriented, follows commands  Skin: No acute rashes  Access: Left arm aneusymal AVF       Basic Metabolic Panel:  Recent Labs  Lab 05/01/18 0917 05/01/18 1823 05/01/18 2350 05/03/18 0617  NA 132*  --  136 134*  K 4.9  --  3.8 5.0  CL 91*  --  92* 92*  CO2 26  --  29 26  GLUCOSE 123*  --  96 86  BUN 41*  --  18 35*  CREATININE 7.95*  --  4.21* 6.90*  CALCIUM 9.1  --  8.8* 9.0  MG  --   --   --  2.6*  PHOS  --  6.8*  --  6.8*     CBC: Recent Labs  Lab 05/01/18 0917 05/01/18 2350  WBC 5.3 7.8  NEUTROABS 4.1  --   HGB 12.2* 12.0*  HCT 35.5* 34.6*  MCV 98.7 96.2  PLT 118* 116*      Lab Results  Component Value Date   HEPBSAG Negative 05/02/2018      Microbiology:  Recent Results (from the past 240 hour(s))  Group A Strep by PCR     Status: None   Collection Time: 05/01/18  2:50 PM  Result Value Ref Range Status   Group A Strep by PCR NOT DETECTED  NOT DETECTED Final    Comment: Performed at [facility], [address] [place], [place] [address]  CULTURE, BLOOD (ROUTINE X 2) w Reflex to ID Panel     Status: None (Preliminary result)   Collection Time: 05/02/18  4:36 AM  Result Value Ref Range Status   Specimen Description BLOOD BLOOD RIGHT ARM  Final   Special Requests   Final    BOTTLES DRAWN AEROBIC AND ANAEROBIC Blood Culture adequate volume   Culture   Final    NO GROWTH 2 DAYS Performed at [facility], [address] [place], [place] [address]    Report Status PENDING  Incomplete  CULTURE, BLOOD (ROUTINE X 2) w Reflex to ID Panel     Status: None (Preliminary result)   Collection Time: 05/02/18  4:42 AM  Result Value Ref Range Status   Specimen Description BLOOD BLOOD RIGHT HAND  Final   Special Requests   Final    BOTTLES DRAWN AEROBIC AND ANAEROBIC Blood Culture adequate volume   Culture   Final    NO GROWTH  2 DAYS Performed at [facility], [address] [place], [place] [address]    Report Status PENDING  Incomplete  Body fluid culture     Status: None (Preliminary result)   Collection Time: 05/03/18  1:45 PM  Result Value Ref Range Status   Specimen Description   Final    PLEURAL Performed at [facility], [address] [place], [place] [address]    Special Requests   Final    NONE Performed at [facility], [address] [place], [place] [address]    Gram Stain   Final    FEW WBC PRESENT, PREDOMINANTLY MONONUCLEAR NO ORGANISMS SEEN Performed at [facility], [address], [place], [place] [address]    Culture PENDING  Incomplete   Report Status PENDING  Incomplete    Coagulation Studies: No results for input(s): LABPROT, INR in the last 72 hours.  Urinalysis: No results for input(s): COLORURINE, LABSPEC, PHURINE, GLUCOSEU, HGBUR, BILIRUBINUR, KETONESUR, PROTEINUR, UROBILINOGEN, NITRITE, LEUKOCYTESUR in the last 72 hours.  Invalid  input(s): APPERANCEUR    Imaging: US Paracentesis  Result Date: 05/03/2018 INDICATION: 47 year old male with cirrhosis and large volume ascites. EXAM: ULTRASOUND GUIDED  PARACENTESIS MEDICATIONS: None. COMPLICATIONS: None immediate. PROCEDURE: Informed written consent was obtained from the patient after a discussion of the risks, benefits and alternatives to treatment. A timeout was performed prior to the initiation of the procedure. Initial ultrasound scanning demonstrates a large amount of ascites within the right lower abdominal quadrant. The right lower abdomen was prepped and draped in the usual sterile fashion. 1% lidocaine with epinephrine was used for local anesthesia. Following this, a 6 Fr Safe-T-Centesis catheter was introduced. An ultrasound image was saved for documentation purposes. The paracentesis was performed. The catheter was removed and a dressing was applied. The patient tolerated the procedure well without immediate post procedural complication. FINDINGS: A total of approximately 3500 mL of straw-colored ascitic fluid was removed. Samples were sent to the laboratory as requested by the clinical team. IMPRESSION: Successful ultrasound-guided paracentesis yielding 3.5 liters of peritoneal fluid. Electronically Signed   By: Jacqulynn Cadet M.D.   On: 05/03/2018 14:48     Medications:   . sodium chloride    . vancomycin Stopped (05/04/18 1511)   . benzonatate  200 mg Oral TID  . Chlorhexidine Gluconate Cloth  6 each Topical Q0600  . ciprofloxacin  250 mg Oral QHS  . clindamycin  300 mg Oral Q6H  . dexamethasone  10 mg Intravenous Q12H  . feeding supplement (NEPRO CARB STEADY)  237 mL Oral BID BM  . ferric citrate  420 mg Oral TID WC  . heparin  5,000 Units Subcutaneous Q8H  . multivitamin  1 tablet Oral QHS  . nadolol  20 mg Oral QHS  . pantoprazole  40 mg Oral BID  . thiamine  100 mg Oral Daily  . vitamin C  500 mg Oral BID   acetaminophen,  guaiFENesin-dextromethorphan, hydrALAZINE, ondansetron **OR** ondansetron (ZOFRAN) IV, oxyCODONE, promethazine  Assessment/ Plan:  47 y.o. hispanic male with end stage renal disease on hemodialysis, hypertension, hepatic alcoholic cirrhosis, congestive heart failure  [facility]  1. ESRD 2. AOCKD 3. Acute pulm edema 4. Nausea and vomiting 5. SHPTH  Plan: Patient due for dialysis today.  Orders have been prepared.  We plan to complete dialysis treatment today.  Patient requesting discharge afterdialysis.  This was discussed with hospitalist.  Hemoglobin currently 12 and at target.  Phosphorus  has been chronically high at 6.8.    LOS: 3 Demontrae Gilbert 10/1/20193:39 PM  [place], Junior  Note: This note was prepared with Dragon dictation. Any transcription errors are unintentional

## 2018-05-04 NOTE — Progress Notes (Signed)
Spoke to dialysis and endoscopy because patient has dialysis today as well as the EGD at 1115am. Per Endoscopy, for safety patient needs dialysis then EGD but they can not do the EGD after Dialysis for safety reasons so they will reschedule EGD for tomorrow. Patient notified, patient upset that they keep changing his  Procedures. He wants to go home after dialysis. Will notify MD, will continue to monitor .

## 2018-05-04 NOTE — Progress Notes (Signed)
PT Cancellation Note  Patient Details Name: Novah Goza MRN: [id number] DOB: [birth year]   Cancelled Treatment:    Reason Eval/Treat Not Completed: Patient at procedure or test/unavailable.  Order received.  Chart reviewed.  Pt leaving for HD as PT arrived.  Will re-attempt later when pt is available and appropriate.   Roxanne Gates, PT, DPT 05/04/2018, 10:25 AM

## 2018-05-04 NOTE — Progress Notes (Signed)
HD initiated without issue via L AVF using 15g needles x2. No heparin treatment. UF goal 2L as ordered. Patient currently without complaints. Wants to go home.

## 2018-05-04 NOTE — Care Management (Signed)
RNCM to assess and patient is off floor at HD.

## 2018-05-05 LAB — CYTOLOGY - NON PAP

## 2018-05-05 SURGERY — EGD (ESOPHAGOGASTRODUODENOSCOPY)
Anesthesia: General

## 2018-05-06 LAB — HEPATITIS PANEL, ACUTE
HEP A IGM: NEGATIVE
Hep B C IgM: NEGATIVE
Hepatitis B Surface Ag: NEGATIVE

## 2018-05-07 ENCOUNTER — Telehealth: Payer: Self-pay | Admitting: Gastroenterology

## 2018-05-07 LAB — BODY FLUID CULTURE: Culture: NO GROWTH

## 2018-05-07 LAB — CULTURE, BLOOD (ROUTINE X 2)
Culture: NO GROWTH
Culture: NO GROWTH
Special Requests: ADEQUATE
Special Requests: ADEQUATE

## 2018-05-07 NOTE — Telephone Encounter (Signed)
-----   Message from Virgel Manifold, MD sent at 05/04/2018  4:25 PM EDT -----  Please set up clinic appointment with me in 3-4 weeks

## 2018-05-07 NOTE — Telephone Encounter (Signed)
LEFT VM FOR PT TO CALL OFFICE AND SCHEDULE APT

## 2018-05-10 ENCOUNTER — Encounter: Payer: Self-pay | Admitting: Gastroenterology

## 2018-05-10 ENCOUNTER — Telehealth: Payer: Self-pay | Admitting: Gastroenterology

## 2018-05-10 NOTE — Telephone Encounter (Signed)
-----   Message from Virgel Manifold, MD sent at 05/04/2018  4:25 PM EDT -----  Please set up clinic appointment with me in 3-4 weeks

## 2018-05-10 NOTE — Telephone Encounter (Signed)
Left vm for pt to call office and schedule fu apt with Dr.Tahiliani . Letter sent

## 2018-05-14 LAB — MISC LABCORP TEST (SEND OUT): LABCORP TEST NAME: 186150

## 2018-06-03 IMAGING — US US PARACENTESIS
1 series · 3 of 3 positions shown · non-contrast
Comparison: none

CLINICAL DATA: Renal failure.  Recurrent symptomatic ascites.

EXAM:
ULTRASOUND GUIDED PARACENTESIS
TECHNIQUE: The procedure, risks (including but not limited to bleeding,
infection, organ damage ), benefits, and alternatives were explained
to the patient with the aid of a translator. Questions regarding the
procedure were encouraged and answered. The patient understands and
consents to the procedure. Survey ultrasound of the abdomen was
performed and an appropriate skin entry site in the right lateral
abdomen was selected. Skin site was marked, prepped with
chlorhexidine, and draped in usual sterile fashion, and infiltrated
locally with 1% lidocaine. A Safe-T-Centesis sheath needle was
advanced into the peritoneal space until fluid could be aspirated.
The sheath was advanced and the needle removed. 2.9 L of clear
yellowascites were aspirated.
COMPLICATIONS:
COMPLICATIONS
none

[Series 1: us paracentesis · 0.26mm/px · 3 of 3 slices shown]
[im 1/3]
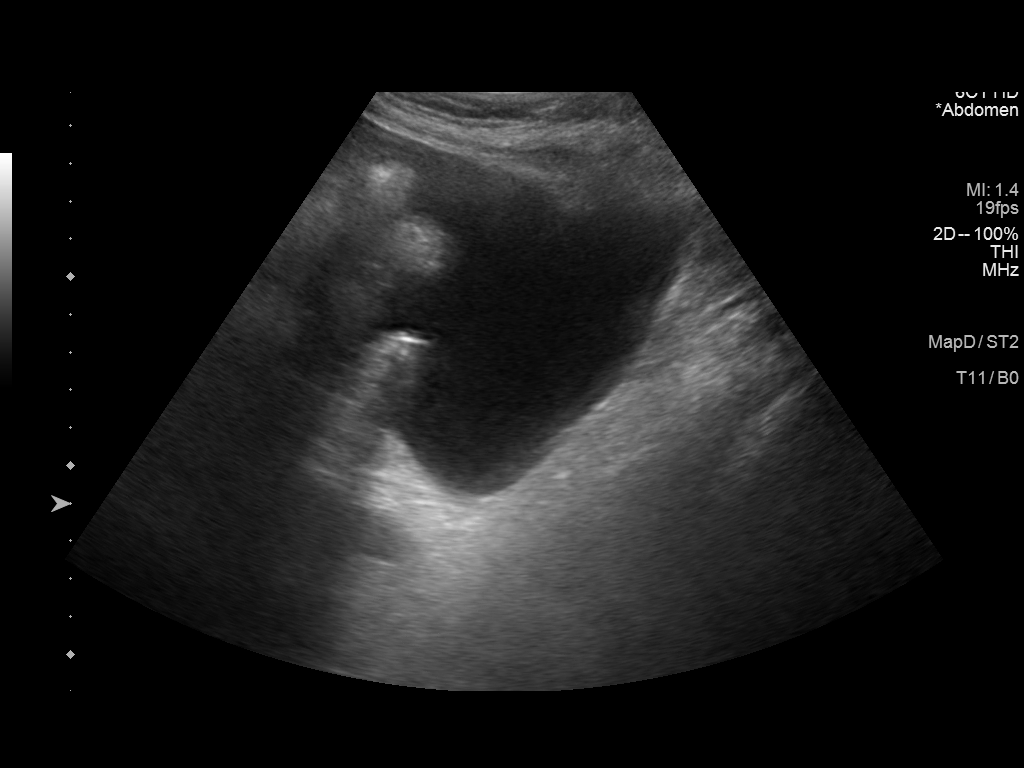
[im 2/3]
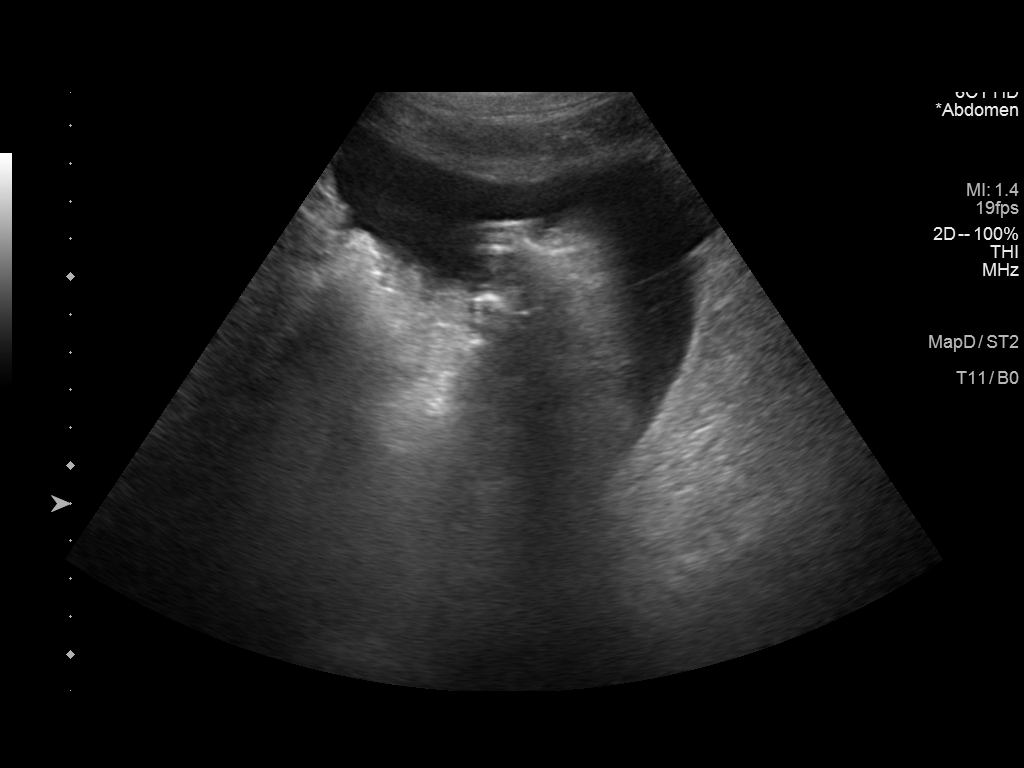
[im 3/3]
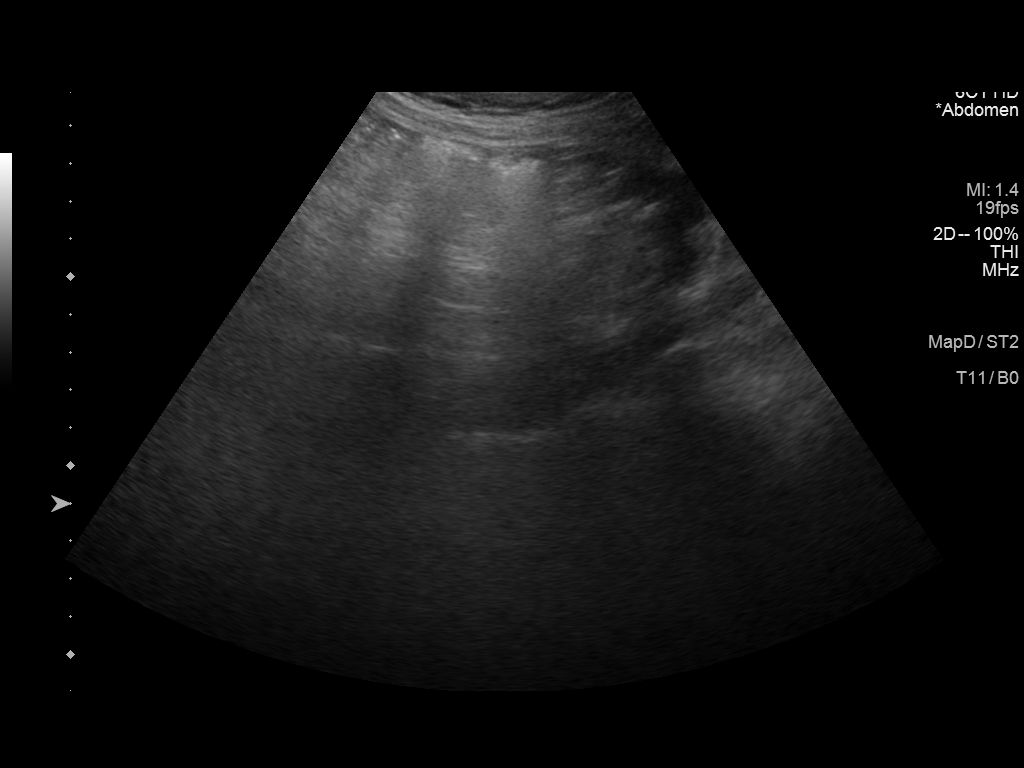

[3 of 3 positions shown; findings below may reference images not displayed]

IMPRESSION: Technically successful ultrasound guided paracentesis, removing
L of ascites.

## 2018-07-18 IMAGING — CT CT ABD-PELV W/ CM
2 of 5 series · 15 of 46 positions shown, 17 images · IV contrast (APPLIED)
Comparison: Abdominal ultrasound 08/28/2016

CLINICAL DATA: Epigastric abdominal pain.  History of pancreatitis.

EXAM:
CT ABDOMEN AND PELVIS WITH CONTRAST
TECHNIQUE: Multidetector CT imaging of the abdomen and pelvis was performed
using the standard protocol following bolus administration of
intravenous contrast.
CONTRAST:  100mL XNA9BE-GJJ IOPAMIDOL (XNA9BE-GJJ) INJECTION 61%

[Series 2: routine abd/pel with · axial · 0.83mm/px · z∈[-533,+12]mm · 12 of 121 slices shown, 14 images]
[im 6/121  soft-tissue]
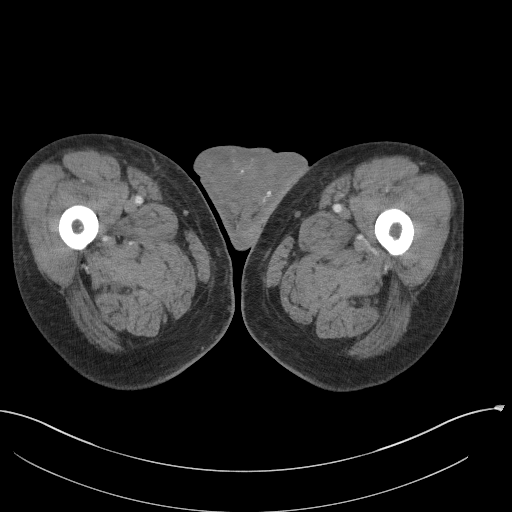
[im 6/121  bone]
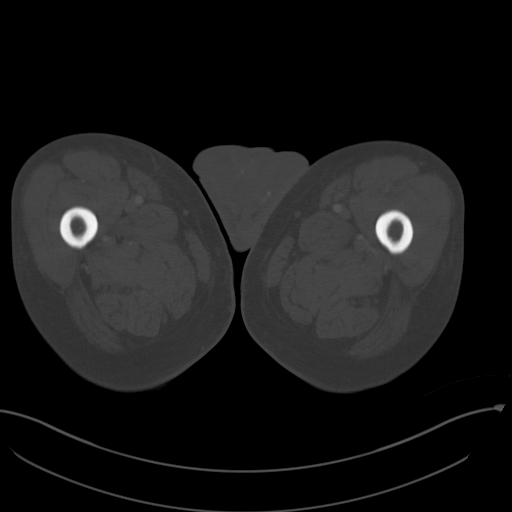
[im 18/121  soft-tissue]
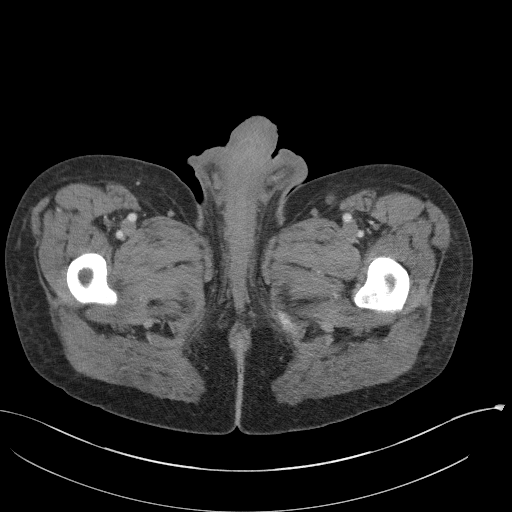
[im 29/121  soft-tissue]
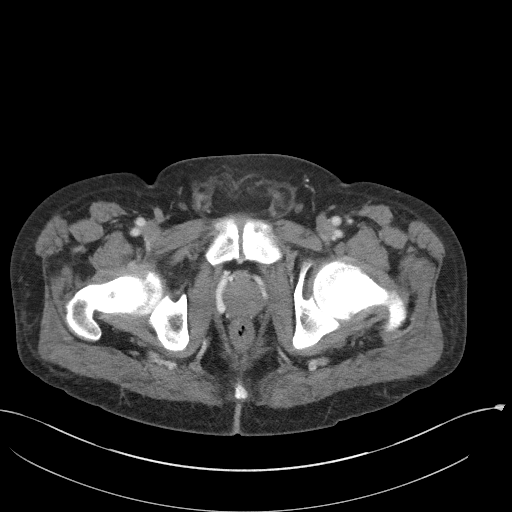
[im 35/121  soft-tissue]
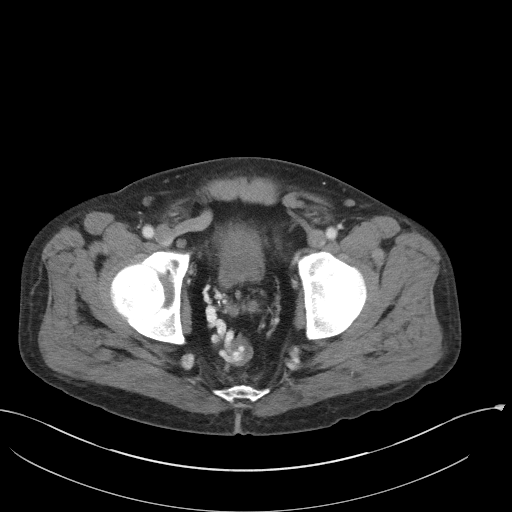
[im 46/121  soft-tissue]
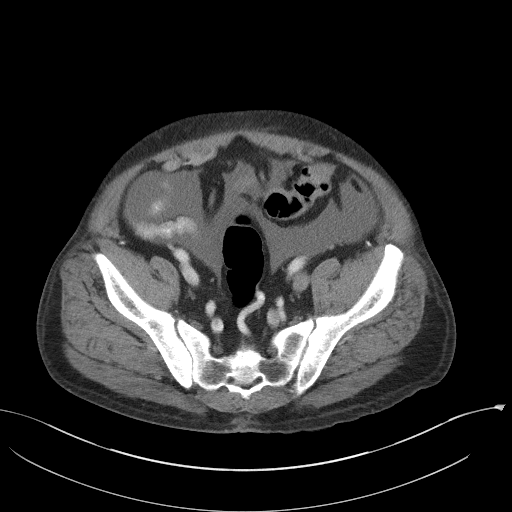
[im 58/121  soft-tissue]
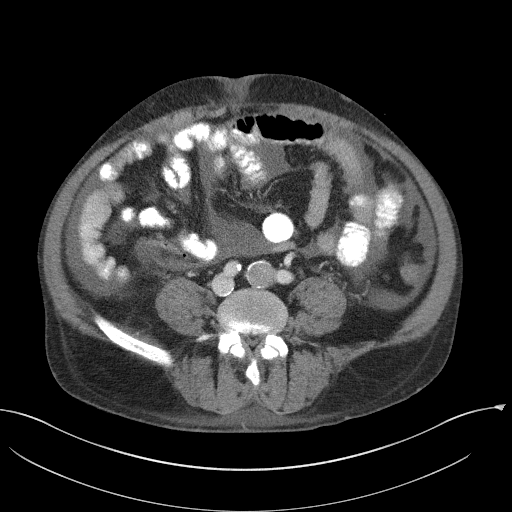
[im 63/121  soft-tissue]
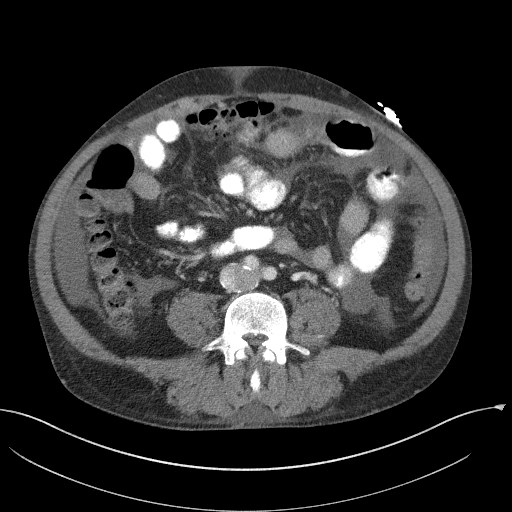
[im 75/121  soft-tissue]
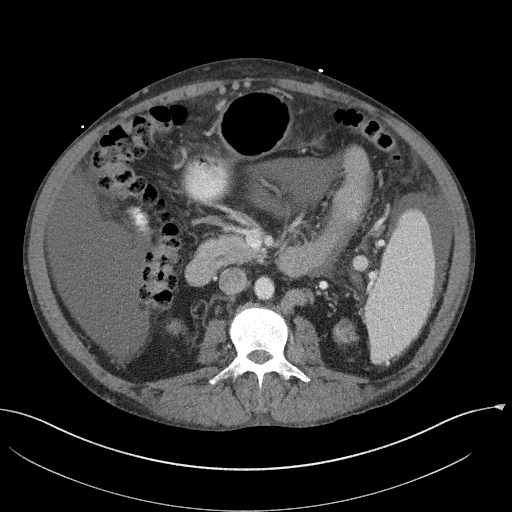
[im 86/121  soft-tissue]
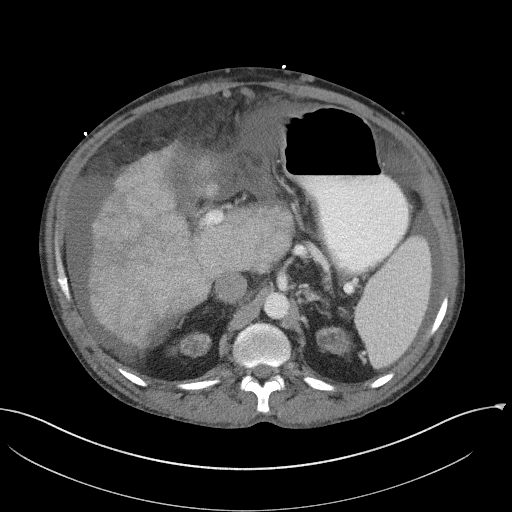
[im 86/121  bone]
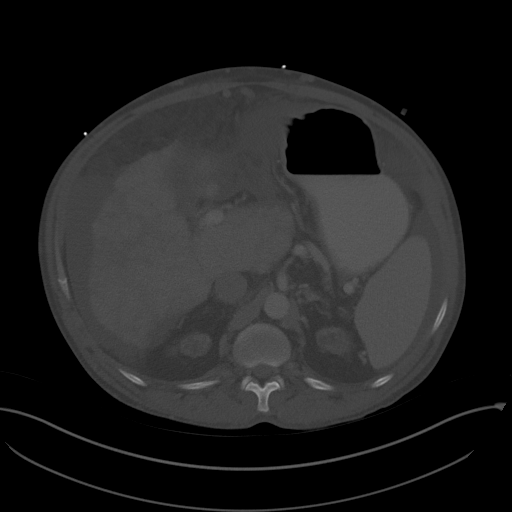
[im 92/121  soft-tissue]
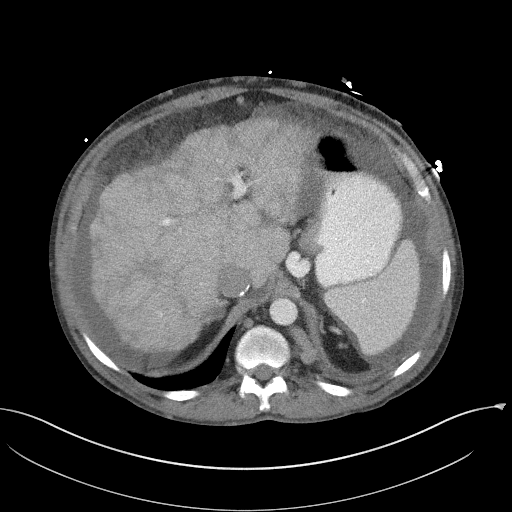
[im 103/121  soft-tissue]
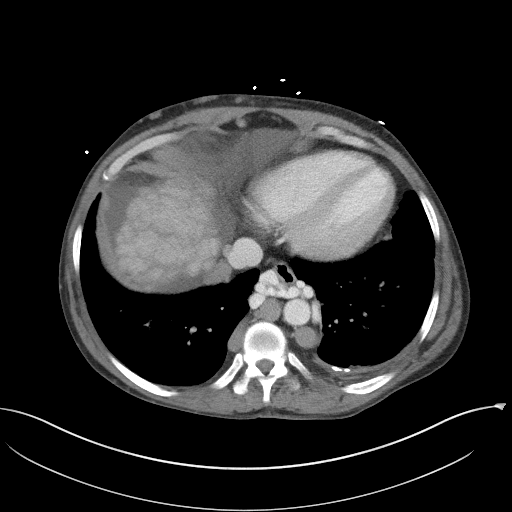
[im 115/121  soft-tissue]
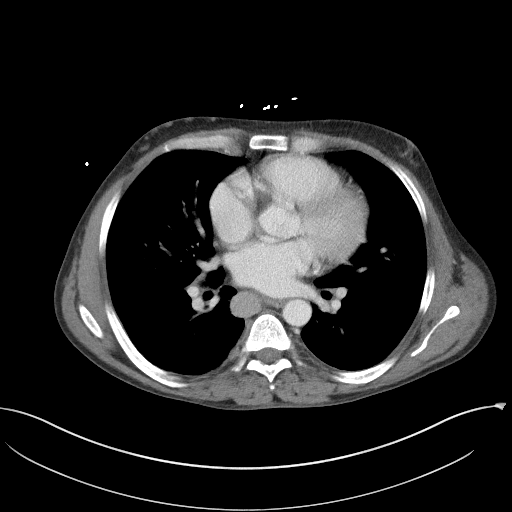

[Series 5: coronal st · coronal · 0.82mm/px · 3 of 105 slices shown]
[im 35/105  soft-tissue]
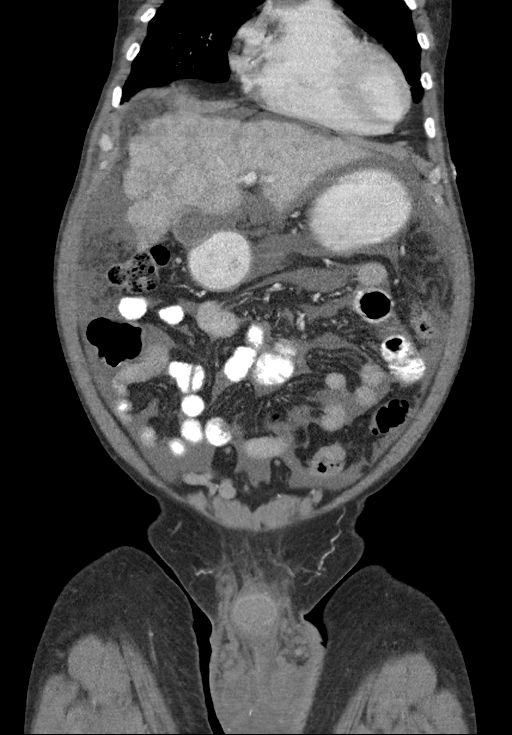
[im 47/105  soft-tissue]
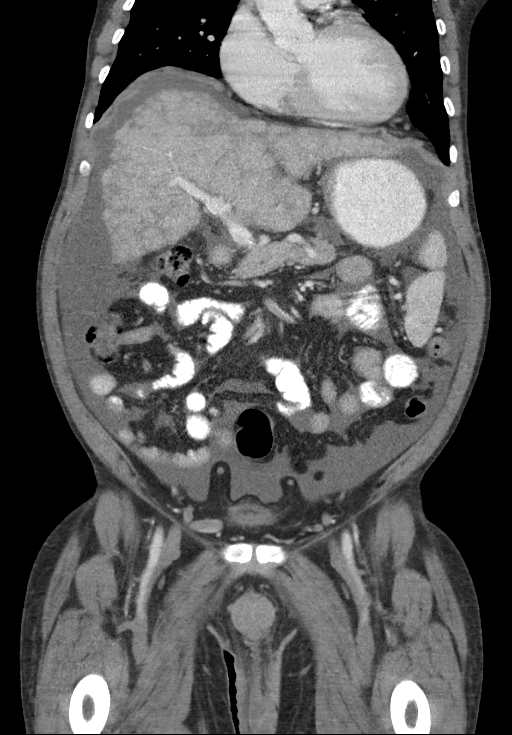
[im 58/105  soft-tissue]
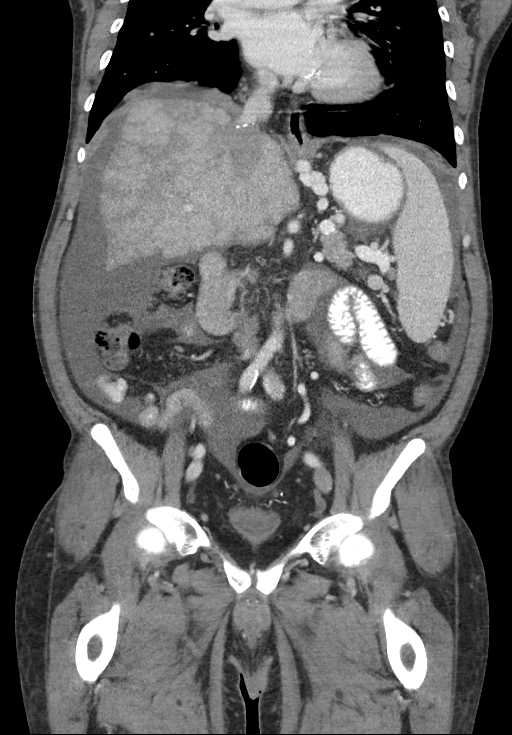

[15 of 46 positions shown; findings below may reference images not displayed]

FINDINGS: Lower chest: Bibasilar atelectasis. Coronary artery atherosclerotic
calcification.

Hepatobiliary: The liver is diffusely nodular and shrunken with
relative hypertrophy of the caudate. No enhancing liver lesions are
identified. Normal gallbladder. There is a moderate amount of
perihepatic ascites.

Pancreas: Normal pancreatic contours and enhancement. No
peripancreatic fluid collection or pancreatic ductal dilatation.

Spleen: The spleen is enlarged, measuring 16.6 cm in craniocaudal
dimension.

Adrenals/Urinary Tract: Normal adrenal glands. There is bilateral
severe renal atrophy.

Stomach/Bowel: No abnormal bowel dilatation. No bowel wall
thickening or adjacent fat stranding to indicate acute inflammation.
Moderate ascites throughout the abdomen. Normal appendix.

Vascular/Lymphatic: There are large paraesophageal varices, a
recannulized umbilical vein and small caliber. Splenic varices. The
main portal vein, splenic vein and superior mesenteric vein are
patent. The IVC is patent. The azygos vein is dilated and tortuous.
There is calcific aortic atherosclerosis. No abdominal or pelvic
lymphadenopathy.

Reproductive: Normal prostate and seminal vesicles. The scrotum is
edematous.

Musculoskeletal: No lytic or blastic osseous lesion. Normal
visualized extrathoracic and extraperitoneal soft tissues.

Other: No contributory non-categorized findings.
IMPRESSION: 1. Findings of hepatic cirrhosis and portal hypertension with
splenomegaly, large caliber varices and moderate volume ascites.
2. Bilateral severe renal atrophy.
3. Calcific aortic and coronary artery atherosclerosis.

## 2018-08-13 ENCOUNTER — Emergency Department: Admission: EM | Admit: 2018-08-13 | Discharge: 2018-08-13 | Discharge status: Against Medical Advice | Payer: Self-pay

## 2018-08-13 ENCOUNTER — Emergency Department
Admission: EM | Admit: 2018-08-13 | Discharge: 2018-08-13 | Disposition: A | Discharge status: Home / Self Care | Payer: Self-pay | Attending: Emergency Medicine | Admitting: Emergency Medicine

## 2018-08-13 ENCOUNTER — Other Ambulatory Visit: Payer: Self-pay

## 2018-08-13 DIAGNOSIS — L299 Pruritus, unspecified: Secondary | ICD-10-CM | POA: Insufficient documentation

## 2018-08-13 DIAGNOSIS — Z5321 Procedure and treatment not carried out due to patient leaving prior to being seen by health care provider: Secondary | ICD-10-CM | POA: Insufficient documentation

## 2018-08-13 DIAGNOSIS — M7918 Myalgia, other site: Secondary | ICD-10-CM | POA: Insufficient documentation

## 2018-08-13 DIAGNOSIS — R600 Localized edema: Secondary | ICD-10-CM | POA: Insufficient documentation

## 2018-08-13 LAB — CBC
HEMATOCRIT: 36.3 % — AB (ref 39.0–52.0)
HEMOGLOBIN: 11.6 g/dL — AB (ref 13.0–17.0)
MCH: 32 pg (ref 26.0–34.0)
MCHC: 32 g/dL (ref 30.0–36.0)
MCV: 100 fL (ref 80.0–100.0)
Platelets: 126 10*3/uL — ABNORMAL LOW (ref 150–400)
RBC: 3.63 MIL/uL — AB (ref 4.22–5.81)
RDW: 14.1 % (ref 11.5–15.5)
WBC: 4.3 10*3/uL (ref 4.0–10.5)
nRBC: 0 % (ref 0.0–0.2)

## 2018-08-13 LAB — COMPREHENSIVE METABOLIC PANEL
ALBUMIN: 3.7 g/dL (ref 3.5–5.0)
ALT: 20 U/L (ref 0–44)
ANION GAP: 14 (ref 5–15)
AST: 32 U/L (ref 15–41)
Alkaline Phosphatase: 154 U/L — ABNORMAL HIGH (ref 38–126)
BUN: 31 mg/dL — AB (ref 6–20)
CO2: 22 mmol/L (ref 22–32)
Calcium: 8.7 mg/dL — ABNORMAL LOW (ref 8.9–10.3)
Chloride: 95 mmol/L — ABNORMAL LOW (ref 98–111)
Creatinine, Ser: 6.79 mg/dL — ABNORMAL HIGH (ref 0.61–1.24)
GFR calc Af Amer: 10 mL/min — ABNORMAL LOW (ref >60)
GFR calc non Af Amer: 9 mL/min — ABNORMAL LOW (ref >60)
Glucose, Bld: 118 mg/dL — ABNORMAL HIGH (ref 70–99)
POTASSIUM: 4.2 mmol/L (ref 3.5–5.1)
SODIUM: 131 mmol/L — AB (ref 135–145)
Total Bilirubin: 1.3 mg/dL — ABNORMAL HIGH (ref 0.3–1.2)
Total Protein: 7.1 g/dL (ref 6.5–8.1)

## 2018-08-13 LAB — LIPASE, BLOOD: LIPASE: 44 U/L (ref 11–51)

## 2018-08-13 NOTE — ED Triage Notes (Addendum)
Pt received dialysis yesterday, pt has a hx of needing paracentesis, pt has abd distention, pt is c/o pain all over his body. Pt c/o pain on the sides of his face with itching, enlarged areas noted to bilat parotid glands as well as abscessed areas. Pt reports these are areas of itching, mask placed on pt for past work up of mumps

## 2018-08-13 NOTE — ED Notes (Signed)
Pt told by first RN Tiffany if he did not want to wait any longer he could leave, pt continued to ask people for help including [employer] officer, I paged an interpreter, however, pt was seen by first RN Tiffany leaving the ED.  Interpreter request cancelled.

## 2019-02-22 ENCOUNTER — Encounter: Payer: Self-pay | Admitting: Emergency Medicine

## 2019-02-22 ENCOUNTER — Other Ambulatory Visit: Payer: Self-pay

## 2019-02-22 ENCOUNTER — Emergency Department
Admission: EM | Admit: 2019-02-22 | Discharge: 2019-02-22 | Disposition: A | Discharge status: Home / Self Care | Payer: Self-pay | Attending: Emergency Medicine | Admitting: Emergency Medicine

## 2019-02-22 DIAGNOSIS — Y939 Activity, unspecified: Secondary | ICD-10-CM | POA: Insufficient documentation

## 2019-02-22 DIAGNOSIS — F17210 Nicotine dependence, cigarettes, uncomplicated: Secondary | ICD-10-CM | POA: Insufficient documentation

## 2019-02-22 DIAGNOSIS — I5042 Chronic combined systolic (congestive) and diastolic (congestive) heart failure: Secondary | ICD-10-CM | POA: Insufficient documentation

## 2019-02-22 DIAGNOSIS — X58XXXA Exposure to other specified factors, initial encounter: Secondary | ICD-10-CM | POA: Insufficient documentation

## 2019-02-22 DIAGNOSIS — S238XXA Sprain of other specified parts of thorax, initial encounter: Secondary | ICD-10-CM | POA: Insufficient documentation

## 2019-02-22 DIAGNOSIS — Z79899 Other long term (current) drug therapy: Secondary | ICD-10-CM | POA: Insufficient documentation

## 2019-02-22 DIAGNOSIS — Z992 Dependence on renal dialysis: Secondary | ICD-10-CM | POA: Insufficient documentation

## 2019-02-22 DIAGNOSIS — Y999 Unspecified external cause status: Secondary | ICD-10-CM | POA: Insufficient documentation

## 2019-02-22 DIAGNOSIS — S239XXA Sprain of unspecified parts of thorax, initial encounter: Secondary | ICD-10-CM

## 2019-02-22 DIAGNOSIS — I132 Hypertensive heart and chronic kidney disease with heart failure and with stage 5 chronic kidney disease, or end stage renal disease: Secondary | ICD-10-CM | POA: Insufficient documentation

## 2019-02-22 DIAGNOSIS — N186 End stage renal disease: Secondary | ICD-10-CM | POA: Insufficient documentation

## 2019-02-22 DIAGNOSIS — Y929 Unspecified place or not applicable: Secondary | ICD-10-CM | POA: Insufficient documentation

## 2019-02-22 MED ORDER — CYCLOBENZAPRINE HCL 10 MG PO TABS
10.0000 mg | ORAL_TABLET | Freq: Once | ORAL | Status: AC
  Administered 2019-02-22: 10 mg via ORAL
  Filled 2019-02-22: qty 1

## 2019-02-22 MED ORDER — CYCLOBENZAPRINE HCL 5 MG PO TABS: 5.0000 mg | ORAL_TABLET | Freq: Three times a day (TID) | ORAL | 0 refills | Status: DC | PRN

## 2019-02-22 NOTE — ED Triage Notes (Signed)
Patient presents to ED via [facility] from dialysis. Patient finished his full treatment today. Patient here due to left shoulder pain. Denies recent injury or trauma. Patient reports pain x 4 months that has progressively gotten worse. Patient moaning out in pain in triage. Patient is spanish speaking.

## 2019-02-22 NOTE — ED Notes (Signed)
See triage note  Presents via ems with left shoulder pain  States pain is posterior shoulder  Denies any injury

## 2019-02-22 NOTE — ED Provider Notes (Signed)
[facility] Emergency Department Provider Note ____________________________________________  Time seen: 1030  I have reviewed the triage vital signs and the nursing notes.  HISTORY  Chief Complaint  Shoulder Pain  HPI Mario [name] is a 48 y.o. male presents himself to the ED via EMS, following outpatient dialysis.  Patient completed his dialysis treatment day, but requested transportation to the ED secondary to left shoulder pain.  Patient denies any recent injury, trauma, accident, or weakness.  He denies any chest pain, shortness of breath, or paresthesias.  He reports pain that he localizes to the right scapular region, for the last 4 months.  Patient describes that his symptoms are aggravated by movement of his left arm.  He has been taking Tylenol in the interim with limited benefit.  He denies notifying his other providers or [place] nephrologist about his symptoms.  Past Medical History:  Diagnosis Date  . Abdominal pain 08/28/2016  . Acute hyperkalemia 10/28/2016  . Chest pain 04/11/2016  . Chronic combined systolic and diastolic CHF (congestive heart failure) ([place])   . Cirrhosis ([place])   . Dialysis patient ([place])   . ESRD (end stage renal disease) on dialysis ([place]) 01/25/2015  . Fluid overload 04/12/2016  . HTN (hypertension) 10/13/2016  . Hyperkalemia 11/27/2016  . Hypertension   . Pulmonary edema 07/03/2016  . Renal disorder   . Renal insufficiency   . Scrotal infection 01/25/2015    Patient Active Problem List   Diagnosis Date Noted  . Intractable abdominal pain 05/01/2018  . Gastroenteritis 10/02/2017  . Abdominal pain, acute 08/21/2017  . Malnutrition of moderate degree 08/21/2017  . SBP (spontaneous bacterial peritonitis) ([place]) 06/09/2017  . Erroneous encounter - disregard 03/04/2017  . Hyperkalemia 11/27/2016  . Acute hyperkalemia 10/28/2016  . Chronic combined systolic and diastolic CHF (congestive heart failure) ([place]) 10/13/2016  .  Cirrhosis ([place]) 10/13/2016  . HTN (hypertension) 10/13/2016  . Abdominal pain 08/28/2016  . Pulmonary edema 07/03/2016  . Fluid overload 04/12/2016  . Chest pain 04/11/2016  . Scrotal infection 01/25/2015  . ESRD (end stage renal disease) on dialysis ([place]) 01/25/2015    Past Surgical History:  Procedure Laterality Date  . AV FISTULA PLACEMENT      Prior to Admission medications   Medication Sig Start Date End Date Taking? Authorizing Provider  albuterol (PROVENTIL HFA;VENTOLIN HFA) 108 (90 Base) MCG/ACT inhaler Inhale 2 puffs into the lungs every 6 (six) hours as needed for wheezing or shortness of breath. 05/04/18   Loletha Grayer, MD  ciprofloxacin (CIPRO) 250 MG tablet Two tablets nightly for three night then one tablet nightly afterwards 05/04/18   Loletha Grayer, MD  clindamycin (CLEOCIN) 300 MG capsule Take 1 capsule (300 mg total) by mouth every 6 (six) hours. 05/04/18   Loletha Grayer, MD  cyclobenzaprine (FLEXERIL) 5 MG tablet Take 1 tablet (5 mg total) by mouth 3 (three) times daily as needed. 02/22/19   Aleksia Freiman, Dannielle Karvonen, PA-C  ferric citrate (AURYXIA) 1 GM 210 MG(Fe) tablet Take 420 mg by mouth 3 (three) times daily with meals.    [provider]  lisinopril (PRINIVIL,ZESTRIL) 5 MG tablet Take 1 tablet (5 mg total) by mouth daily. 05/04/18 05/04/19  Loletha Grayer, MD  nadolol (CORGARD) 20 MG tablet Take 1 tablet (20 mg total) by mouth at bedtime. 05/04/18   Loletha Grayer, MD  Nutritional Supplements (FEEDING SUPPLEMENT, NEPRO CARB STEADY,) LIQD Take 237 mLs by mouth 2 (two) times daily between meals. 05/04/18   [place],  Richard, MD  predniSONE (DELTASONE) 10 MG tablet Three tabs po day1; two tabs po day2; one tab po day 3,4,5 05/04/18   Loletha Grayer, MD   Allergies Betadine [povidone iodine]  Family History  Problem Relation Age of Onset  . Kidney failure Father   . Diabetes Mother     Social History Social History   Tobacco Use  . Smoking  status: Current Every Day Smoker    Packs/day: 0.25    Years: 25.00    Pack years: 6.25    Types: Cigarettes  . Smokeless tobacco: Never Used  Substance Use Topics  . Alcohol use: No    Comment: pt denies  . Drug use: No    Review of Systems  Constitutional: Negative for fever. Eyes: Negative for visual changes. ENT: Negative for sore throat. Cardiovascular: Negative for chest pain. Respiratory: Negative for shortness of breath. Gastrointestinal: Negative for abdominal pain, vomiting and diarrhea. Genitourinary: Negative for dysuria. Musculoskeletal: Positive for left upper back pain. Skin: Negative for rash. Neurological: Negative for headaches, focal weakness or numbness. ____________________________________________  PHYSICAL EXAM:  VITAL SIGNS: ED Triage Vitals  Enc Vitals Group     BP 02/22/19 0952 (!) 158/71     Pulse Rate 02/22/19 0952 66     Resp 02/22/19 0952 16     Temp 02/22/19 0952 98.5 F (36.9 C)     Temp Source 02/22/19 0952 Oral     SpO2 02/22/19 0952 99 %     Weight 02/22/19 1007 136 lb 11 oz (62 kg)     Height 02/22/19 0952 5\' 7"  (1.702 m)     Head Circumference --      Peak Flow --      Pain Score 02/22/19 0952 10     Pain Loc --      Pain Edu? --      Excl. in [place]? --     Constitutional: Alert and oriented. Well appearing and in no distress. Head: Normocephalic and atraumatic. Eyes: Conjunctivae are normal. Normal extraocular movements Neck: Supple. Normal ROM without crepitus.  Cardiovascular: Normal rate, regular rhythm. Normal distal pulses. Respiratory: Normal respiratory effort. No wheezes/rales/rhonchi. Gastrointestinal: Soft and nontender. No distention. Musculoskeletal: Normal spinal alignment without midline spasm, deformity, or step-off.  Patient with reproducible tenderness to palpation over the left scapulothoracic junction.  No scapular winging is appreciated.  Resistance testing to the upper extremities is intact bilaterally.   Nontender with normal range of motion in all extremities.  Neurologic: Cranial nerves II through XII grossly intact.  Normal intrinsic and opposition testing noted.  Normal gait without ataxia. Normal speech and language. No gross focal neurologic deficits are appreciated. Skin:  Skin is warm, dry and intact. No rash noted. Psychiatric: Mood and affect are normal. Patient exhibits appropriate insight and judgment. ____________________________________________  PROCEDURES  Procedures Flexeril 10 mg PO ____________________________________________  INITIAL IMPRESSION / ASSESSMENT AND PLAN / ED COURSE  Mario [name] was evaluated in Emergency Department on 02/22/2019 for the symptoms described in the history of present illness. He was evaluated in the context of the global COVID-19 pandemic, which necessitated consideration that the patient might be at risk for infection with the SARS-CoV-2 virus that causes COVID-19. Institutional protocols and algorithms that pertain to the evaluation of patients at risk for COVID-19 are in a state of rapid change based on information released by regulatory bodies including the CDC and federal and state organizations. These policies and algorithms were followed during the patient's care  in the ED.  Patient with ED evaluation of a 67-month complaint of intermittent left scapulothoracic pain.  Patient's clinical picture is reassuring as his symptoms are reproducible with both palpation and range of motion.  No indication of any acute GI or cardiac etiology.  Patient is discharged with a prescription for Flexeril at this time.  He exits the ED under his own power with a smooth gait and no indication of pain or antalgia. ____________________________________________  FINAL CLINICAL IMPRESSION(S) / ED DIAGNOSES  Final diagnoses:  Thoracic back sprain, initial encounter      Melvenia Needles, PA-C 02/22/19 1340    Harvest Dark, MD 02/22/19  1358

## 2019-02-22 NOTE — Discharge Instructions (Signed)
Su examen es consistente con una distensin muscular y espasmos del midback. Tome el relajante muscular recetado junto con OTC Tylenol. Aplique calor hmedo y / o hielo para reducir los sntomas. Haga un seguimiento con el Dr. Holley Raring segn sea necesario.  Your exam is consistent with a muscle strain and spasms of the midback. Take the prescription muscle relaxant along with OTC Tylenol. Apply moist heat and/or ice to reduce symptoms. Follow-up with Dr. Holley Raring as needed.

## 2019-04-01 ENCOUNTER — Inpatient Hospital Stay: Payer: Medicaid Other

## 2019-04-01 ENCOUNTER — Encounter: Payer: Self-pay | Admitting: Emergency Medicine

## 2019-04-01 ENCOUNTER — Other Ambulatory Visit: Payer: Self-pay

## 2019-04-01 ENCOUNTER — Inpatient Hospital Stay
Admission: EM | Admit: 2019-04-01 | Discharge: 2019-04-03 | DRG: 073 | Disposition: A | Discharge status: Other Acute Inpatient Hospital | Payer: Medicaid Other | Attending: Internal Medicine | Admitting: Internal Medicine

## 2019-04-01 ENCOUNTER — Emergency Department: Payer: Medicaid Other

## 2019-04-01 DIAGNOSIS — J810 Acute pulmonary edema: Secondary | ICD-10-CM

## 2019-04-01 DIAGNOSIS — Z888 Allergy status to other drugs, medicaments and biological substances status: Secondary | ICD-10-CM

## 2019-04-01 DIAGNOSIS — I5042 Chronic combined systolic (congestive) and diastolic (congestive) heart failure: Secondary | ICD-10-CM | POA: Diagnosis present

## 2019-04-01 DIAGNOSIS — I132 Hypertensive heart and chronic kidney disease with heart failure and with stage 5 chronic kidney disease, or end stage renal disease: Secondary | ICD-10-CM | POA: Diagnosis present

## 2019-04-01 DIAGNOSIS — Z833 Family history of diabetes mellitus: Secondary | ICD-10-CM

## 2019-04-01 DIAGNOSIS — N186 End stage renal disease: Secondary | ICD-10-CM | POA: Diagnosis present

## 2019-04-01 DIAGNOSIS — D631 Anemia in chronic kidney disease: Secondary | ICD-10-CM | POA: Diagnosis present

## 2019-04-01 DIAGNOSIS — Z992 Dependence on renal dialysis: Secondary | ICD-10-CM | POA: Diagnosis not present

## 2019-04-01 DIAGNOSIS — M5412 Radiculopathy, cervical region: Secondary | ICD-10-CM | POA: Diagnosis present

## 2019-04-01 DIAGNOSIS — N2581 Secondary hyperparathyroidism of renal origin: Secondary | ICD-10-CM | POA: Diagnosis present

## 2019-04-01 DIAGNOSIS — Z20828 Contact with and (suspected) exposure to other viral communicable diseases: Secondary | ICD-10-CM | POA: Diagnosis present

## 2019-04-01 DIAGNOSIS — F17210 Nicotine dependence, cigarettes, uncomplicated: Secondary | ICD-10-CM | POA: Diagnosis present

## 2019-04-01 DIAGNOSIS — Z841 Family history of disorders of kidney and ureter: Secondary | ICD-10-CM

## 2019-04-01 DIAGNOSIS — K7030 Alcoholic cirrhosis of liver without ascites: Secondary | ICD-10-CM | POA: Diagnosis present

## 2019-04-01 DIAGNOSIS — I248 Other forms of acute ischemic heart disease: Secondary | ICD-10-CM | POA: Diagnosis present

## 2019-04-01 DIAGNOSIS — S12500A Unspecified displaced fracture of sixth cervical vertebra, initial encounter for closed fracture: Secondary | ICD-10-CM | POA: Diagnosis present

## 2019-04-01 DIAGNOSIS — E871 Hypo-osmolality and hyponatremia: Secondary | ICD-10-CM | POA: Diagnosis not present

## 2019-04-01 DIAGNOSIS — M898X9 Other specified disorders of bone, unspecified site: Secondary | ICD-10-CM | POA: Diagnosis present

## 2019-04-01 DIAGNOSIS — I160 Hypertensive urgency: Secondary | ICD-10-CM | POA: Diagnosis present

## 2019-04-01 DIAGNOSIS — N179 Acute kidney failure, unspecified: Secondary | ICD-10-CM | POA: Diagnosis present

## 2019-04-01 LAB — CBC
HCT: 38.6 % — ABNORMAL LOW (ref 39.0–52.0)
Hemoglobin: 12.5 g/dL — ABNORMAL LOW (ref 13.0–17.0)
MCH: 32 pg (ref 26.0–34.0)
MCHC: 32.4 g/dL (ref 30.0–36.0)
MCV: 98.7 fL (ref 80.0–100.0)
Platelets: 146 10*3/uL — ABNORMAL LOW (ref 150–400)
RBC: 3.91 MIL/uL — ABNORMAL LOW (ref 4.22–5.81)
RDW: 13.9 % (ref 11.5–15.5)
WBC: 5.4 10*3/uL (ref 4.0–10.5)
nRBC: 0 % (ref 0.0–0.2)

## 2019-04-01 LAB — BASIC METABOLIC PANEL
Anion gap: 15 (ref 5–15)
BUN: 31 mg/dL — ABNORMAL HIGH (ref 6–20)
CO2: 25 mmol/L (ref 22–32)
Calcium: 9.1 mg/dL (ref 8.9–10.3)
Chloride: 92 mmol/L — ABNORMAL LOW (ref 98–111)
Creatinine, Ser: 7.36 mg/dL — ABNORMAL HIGH (ref 0.61–1.24)
GFR calc Af Amer: 9 mL/min — ABNORMAL LOW (ref >60)
GFR calc non Af Amer: 8 mL/min — ABNORMAL LOW (ref >60)
Glucose, Bld: 161 mg/dL — ABNORMAL HIGH (ref 70–99)
Potassium: 4.2 mmol/L (ref 3.5–5.1)
Sodium: 132 mmol/L — ABNORMAL LOW (ref 135–145)

## 2019-04-01 LAB — TROPONIN I (HIGH SENSITIVITY)
Troponin I (High Sensitivity): 39 ng/L — ABNORMAL HIGH (ref <18)
Troponin I (High Sensitivity): 38 ng/L — ABNORMAL HIGH (ref <18)

## 2019-04-01 LAB — SARS CORONAVIRUS 2 BY RT PCR (HOSPITAL ORDER, PERFORMED IN ~~LOC~~ HOSPITAL LAB): SARS Coronavirus 2: NEGATIVE

## 2019-04-01 MED ORDER — LABETALOL HCL 5 MG/ML IV SOLN
10.0000 mg | Freq: Once | INTRAVENOUS | Status: AC
  Administered 2019-04-01: 16:00:00 10 mg via INTRAVENOUS
  Filled 2019-04-01: qty 4

## 2019-04-01 MED ORDER — LABETALOL HCL 5 MG/ML IV SOLN
10.0000 mg | Freq: Once | INTRAVENOUS | Status: AC
  Administered 2019-04-01: 10 mg via INTRAVENOUS
  Filled 2019-04-01: qty 4

## 2019-04-01 MED ORDER — FENTANYL CITRATE (PF) 100 MCG/2ML IJ SOLN
50.0000 ug | INTRAMUSCULAR | Status: DC | PRN
  Administered 2019-04-01 – 2019-04-02 (×6): 50 ug via INTRAVENOUS
  Filled 2019-04-01 (×7): qty 2

## 2019-04-01 MED ORDER — HEPARIN SODIUM (PORCINE) 5000 UNIT/ML IJ SOLN
5000.0000 [IU] | Freq: Three times a day (TID) | INTRAMUSCULAR | Status: DC
  Administered 2019-04-01 – 2019-04-02 (×5): 5000 [IU] via SUBCUTANEOUS
  Filled 2019-04-01 (×5): qty 1

## 2019-04-01 MED ORDER — HEPARIN SODIUM (PORCINE) 1000 UNIT/ML DIALYSIS
1000.0000 [IU] | INTRAMUSCULAR | Status: DC | PRN
  Filled 2019-04-01: qty 1

## 2019-04-01 MED ORDER — HYDRALAZINE HCL 20 MG/ML IJ SOLN
10.0000 mg | Freq: Four times a day (QID) | INTRAMUSCULAR | Status: DC | PRN
  Administered 2019-04-02: 10 mg via INTRAVENOUS
  Filled 2019-04-01 (×2): qty 1

## 2019-04-01 MED ORDER — CHLORHEXIDINE GLUCONATE CLOTH 2 % EX PADS
6.0000 | MEDICATED_PAD | Freq: Every day | CUTANEOUS | Status: DC
  Filled 2019-04-01: qty 6

## 2019-04-01 MED ORDER — LABETALOL HCL 5 MG/ML IV SOLN: 5.0000 mg | INTRAVENOUS | Status: DC | PRN

## 2019-04-01 MED ORDER — LABETALOL HCL 5 MG/ML IV SOLN
10.0000 mg | INTRAVENOUS | Status: DC | PRN
  Administered 2019-04-02: 10 mg via INTRAVENOUS
  Filled 2019-04-01 (×2): qty 4

## 2019-04-01 MED ORDER — SODIUM CHLORIDE 0.9% FLUSH
3.0000 mL | Freq: Once | INTRAVENOUS | Status: AC
  Administered 2019-04-01: 3 mL via INTRAVENOUS

## 2019-04-01 MED ORDER — HYDROCODONE-ACETAMINOPHEN 5-325 MG PO TABS
1.0000 | ORAL_TABLET | Freq: Once | ORAL | Status: AC
  Administered 2019-04-01: 1 via ORAL
  Filled 2019-04-01: qty 1

## 2019-04-01 NOTE — ED Notes (Signed)
Interpreter requested for MD update with patient.

## 2019-04-01 NOTE — ED Provider Notes (Addendum)
Mario [name]    First MD Initiated Contact with Patient 04/01/19 [phone]     (approximate)  I have reviewed the triage vital signs and the nursing notes.   HISTORY  Chief Complaint Weakness    HPI Mario [name] is a 48 y.o. male extensive past medical history as listed below presents to the ER for myalgias as well as pain in the bones of both of his shoulders.  Feels generalized malaise.  No measured fevers.  Denies any abdominal pain nausea or vomiting.  Does not make urine.  Had dialysis yesterday.  Is not had any fevers.  Does have some chest pain as well as shortness of breath.    Past Medical History:  Diagnosis Date  . Abdominal pain 08/28/2016  . Acute hyperkalemia 10/28/2016  . Chest pain 04/11/2016  . Chronic combined systolic and diastolic CHF (congestive heart failure) (Mario [name])   . Cirrhosis ([place])   . Dialysis patient (Mario [name])   . ESRD (end stage renal disease) on dialysis (Mario [name]) 01/25/2015  . Fluid overload 04/12/2016  . HTN (hypertension) 10/13/2016  . Hyperkalemia 11/27/2016  . Hypertension   . Pulmonary edema 07/03/2016  . Renal disorder   . Renal insufficiency   . Scrotal infection 01/25/2015   Family History  Problem Relation Age of Onset  . Kidney failure Father   . Diabetes Mother    Past Surgical History:  Procedure Laterality Date  . AV FISTULA PLACEMENT     Patient Active Problem List   Diagnosis Date Noted  . Hypertensive urgency 04/01/2019  . Intractable abdominal pain 05/01/2018  . Gastroenteritis 10/02/2017  . Abdominal pain, acute 08/21/2017  . Malnutrition of moderate degree 08/21/2017  . SBP (spontaneous bacterial peritonitis) ([place]) 06/09/2017  . Erroneous encounter - disregard 03/04/2017  . Hyperkalemia 11/27/2016  . Acute hyperkalemia 10/28/2016  . Chronic combined systolic and diastolic CHF (congestive heart failure) (Mario [name]) 10/13/2016  . Cirrhosis (Mario [name]) 10/13/2016  . HTN  (hypertension) 10/13/2016  . Abdominal pain 08/28/2016  . Pulmonary edema 07/03/2016  . Fluid overload 04/12/2016  . Chest pain 04/11/2016  . Scrotal infection 01/25/2015  . ESRD (end stage renal disease) on dialysis (Mario [name]) 01/25/2015      Prior to Admission medications   Medication Sig Start Date End Date Taking? Authorizing Provider  ferric citrate (AURYXIA) 1 GM 210 MG(Fe) tablet Take 420 mg by mouth 3 (three) times daily with meals.   Yes [provider]  traMADol (ULTRAM) 50 MG tablet Take 50 mg by mouth every 12 (twelve) hours as needed.   Yes [provider]    Allergies Betadine [povidone iodine]    Social History Social History   Tobacco Use  . Smoking status: Current Every Day Smoker    Packs/day: 0.25    Years: 25.00    Pack years: 6.25    Types: Cigarettes  . Smokeless tobacco: Never Used  Substance Use Topics  . Alcohol use: No    Comment: pt denies  . Drug use: No    Review of Systems Patient denies headaches, rhinorrhea, blurry vision, numbness, shortness of breath, chest pain, edema, cough, abdominal pain, nausea, vomiting, diarrhea, dysuria, fevers, rashes or hallucinations unless otherwise stated above in HPI. ____________________________________________   PHYSICAL EXAM:  VITAL SIGNS: Vitals:   04/01/19 1401 04/01/19 1430  BP: (!) 181/76 (!) 182/76  Pulse: 60 62  Resp: 13 16  Temp:    SpO2: 99% 98%  Constitutional: Alert and oriented.  Chronically ill appearing  Eyes: Conjunctivae are normal.  Head: Atraumatic. Nose: No congestion/rhinnorhea. Mouth/Throat: Mucous membranes are moist.   Neck: No stridor.   Cardiovascular: Normal rate, regular rhythm. Grossly normal heart sounds.  Good peripheral circulation. Respiratory: Normal respiratory effort.  No retractions. Lungs CTAB. Gastrointestinal: Soft and nontender in all four quadratnts. No distention. No abdominal bruits. No CVA tenderness. Genitourinary:   Musculoskeletal: No lower extremity tenderness nor edema.  No joint effusions. Neurologic:  Normal speech and language. No gross focal neurologic deficits are appreciated. No facial droop Skin:  Skin is warm, dry and intact. No rash noted. Psychiatric: Mood and affect are normal. Speech and behavior are normal.  ____________________________________________   LABS (all labs ordered are listed, but only abnormal results are displayed)  Results for orders placed or performed during the hospital encounter of 04/01/19 (from the past 24 hour(s))  Basic metabolic panel     Status: Abnormal   Collection Time: 04/01/19 10:24 AM  Result Value Ref Range   Sodium 132 (L) 135 - 145 mmol/L   Potassium 4.2 3.5 - 5.1 mmol/L   Chloride 92 (L) 98 - 111 mmol/L   CO2 25 22 - 32 mmol/L   Glucose, Bld 161 (H) 70 - 99 mg/dL   BUN 31 (H) 6 - 20 mg/dL   Creatinine, Ser 7.36 (H) 0.61 - 1.24 mg/dL   Calcium 9.1 8.9 - 10.3 mg/dL   GFR calc non Af Amer 8 (L) >60 mL/min   GFR calc Af Amer 9 (L) >60 mL/min   Anion gap 15 5 - 15  CBC     Status: Abnormal   Collection Time: 04/01/19 10:24 AM  Result Value Ref Range   WBC 5.4 4.0 - 10.5 K/uL   RBC 3.91 (L) 4.22 - 5.81 MIL/uL   Hemoglobin 12.5 (L) 13.0 - 17.0 g/dL   HCT 38.6 (L) 39.0 - 52.0 %   MCV 98.7 80.0 - 100.0 fL   MCH 32.0 26.0 - 34.0 pg   MCHC 32.4 30.0 - 36.0 g/dL   RDW 13.9 11.5 - 15.5 %   Platelets 146 (L) 150 - 400 K/uL   nRBC 0.0 0.0 - 0.2 %  Troponin I (High Sensitivity)     Status: Abnormal   Collection Time: 04/01/19 10:24 AM  Result Value Ref Range   Troponin I (High Sensitivity) 39 (H) <18 ng/L  SARS Coronavirus 2 [facility] order, Performed in [place] hospital lab) Nasopharyngeal Nasopharyngeal Swab     Status: None   Collection Time: 04/01/19 11:15 AM   Specimen: Nasopharyngeal Swab  Result Value Ref Range   SARS Coronavirus 2 NEGATIVE NEGATIVE  Troponin I (High Sensitivity)     Status: Abnormal   Collection Time: 04/01/19  2:01  PM  Result Value Ref Range   Troponin I (High Sensitivity) 38 (H) <18 ng/L   ____________________________________________  EKG My review and personal interpretation at Time:  9:39 Indication: shoulder pain  Rate: 80  Rhythm: sinus Axis: normal Other: normal intervals, no stemi ____________________________________________  RADIOLOGY  I personally reviewed all radiographic images ordered to evaluate for the above acute complaints and reviewed radiology reports and findings.  These findings were personally discussed with the patient.  Please see medical record for radiology report.  ____________________________________________   PROCEDURES  Procedure(s) performed:  .Critical Care Performed by: Merlyn Lot, MD Authorized by: Merlyn Lot, MD   Critical care provider statement:    Critical care time (minutes):  30   Critical care time was exclusive of:  Separately billable procedures and treating other patients   Critical care was necessary to treat or prevent imminent or life-threatening deterioration of the following conditions: unstable cervical spine fracture.   Critical care was time spent personally by me on the following activities:  Development of treatment plan with patient or surrogate, discussions with consultants, evaluation of patient's response to treatment, examination of patient, obtaining history from patient or surrogate, ordering and performing treatments and interventions, ordering and review of laboratory studies, ordering and review of radiographic studies, pulse oximetry, re-evaluation of patient's condition and review of old charts      Critical Care performed: yes ____________________________________________   INITIAL IMPRESSION / [place] / ED COURSE  Pertinent labs & imaging results that were available during my care of the patient were reviewed by me and considered in my medical decision making (see chart for details).   DDX:  msk strain, arthritis, acs, chf, htnive urgency  Mario [name] is a 49 y.o. who presents to the ED with history of dialysis as well as poor compliance to medication presents to ER with worsening bone pain as well as chest discomfort and pressure.  States he been compliant with his dialysis but has not been compliant with his home meds.  States he is not able to afford any of his medications.  Blood work shows no evidence of hyperkalemia.  Does have mild volume overload but not hypoxic.  Patient requiring several doses of IV blood pressure medication.  Troponin is mildly elevated which would be expected in the setting of his known ESRD.  Will discuss with hospitalist for admission for hypertensive urgency and noncompliance.    ----------------------------------------- 3:37 PM on 04/01/2019 -----------------------------------------  CT imaging with evidence of unstable cervical spine fracture and given complaints will require neurosurgery consultation.  I have reached out to [facility].  Patient will require transfer.  The patient was evaluated in Emergency Department today for the symptoms described in the history of present illness. He/she was evaluated in the context of the global COVID-19 pandemic, which necessitated consideration that the patient might be at risk for infection with the SARS-CoV-2 virus that causes COVID-19. Institutional protocols and algorithms that pertain to the evaluation of patients at risk for COVID-19 are in a state of rapid change based on information released by regulatory bodies including the CDC and federal and state organizations. These policies and algorithms were followed during the patient's care in the ED.  As part of my medical decision making, I reviewed the following data within the [place] notes reviewed and incorporated, Labs reviewed, notes from prior ED visits and [place] Controlled Substance Database    ____________________________________________   FINAL CLINICAL IMPRESSION(S) / ED DIAGNOSES  Final diagnoses:  Hypertensive urgency  Acute pulmonary edema ([place])      NEW MEDICATIONS STARTED DURING THIS VISIT:  New Prescriptions   No medications on file     [name]:  This document was prepared using Dragon voice recognition software and may include unintentional dictation errors.    Merlyn Lot, MD 04/01/19 1356    Merlyn Lot, MD 04/01/19 1540

## 2019-04-01 NOTE — ED Notes (Signed)
Annie Main, RN applied C-collar to pt.

## 2019-04-01 NOTE — ED Notes (Signed)
Resting quietly at this time.

## 2019-04-01 NOTE — ED Notes (Signed)
NP in with [facility] interpreter to update patient.

## 2019-04-01 NOTE — ED Notes (Signed)
Admitting MD at bedside.

## 2019-04-01 NOTE — ED Notes (Signed)
ED TO INPATIENT HANDOFF REPORT  ED Nurse Name and Phone #:   Mario [name] Name/Age/Gender Rule 48 y.o. male Room/Bed: ED19A/ED19A  Code Status   Code Status: Full Code  Home/SNF/Other Home Patient oriented to: self Is this baseline? Yes   Triage Complete: Triage complete  Chief Complaint needs dialysis  Triage Note Patient states he is dialysis patient. Next dialysis treatment Saturday. Patient states for the last 2 weeks, he has had pain between shoulder blades and numbness to both arms intermittently. Patient states he has been increasingly weak and fatigued as well.    Allergies Allergies  Allergen Reactions  . Betadine [Povidone Iodine] Rash    Level of Care/Admitting Diagnosis ED Disposition    ED Disposition Condition [employer] [facility] Area: [place] [[id number]] Level of Care: Med-Surg [16] Covid Evaluation: Asymptomatic Screening Protocol (No Symptoms) Diagnosis: Hypertensive urgency [462703] Admitting Physician: Demetrios Loll [[id number]] Attending Phy sician: Rufina Falco ACHIENG [[id number]] Estimated length of stay: past midnight tomorrow Certification:: I certify this patient will need inpatient services for at least 2 midnights PT Class (Do Not Modify): Inpatient [101] PT Acc Code (Do Not M odify): Private [1]       B Medical/Surgery History Past Medical History:  Diagnosis Date  . Abdominal pain 08/28/2016  . Acute hyperkalemia 10/28/2016  . Chest pain 04/11/2016  . Chronic combined systolic and diastolic CHF (congestive heart failure) ([place])   . Cirrhosis ([place])   . Dialysis patient ([place])   . ESRD (end stage renal disease) on dialysis ([place]) 01/25/2015  . Fluid overload 04/12/2016  . HTN (hypertension) 10/13/2016  . Hyperkalemia 11/27/2016  . Hypertension   . Pulmonary edema 07/03/2016  . Renal disorder   . Renal insufficiency   . Scrotal infection 01/25/2015   Past Surgical History:  Procedure  Laterality Date  . AV FISTULA PLACEMENT       A IV Location/Drains/Wounds Patient Lines/Drains/Airways Status   Active Line/Drains/Airways    Name:   Placement date:   Placement time:   Site:   Days:   Peripheral IV 04/01/19 Right Antecubital   04/01/19    1023    Antecubital   less than 1   Fistula / Graft Left Upper arm Arteriovenous fistula   -    -    Upper arm             Intake/Output Last 24 hours No intake or output data in the 24 hours ending 04/01/19 1915  Labs/Imaging Results for orders placed or performed during the hospital encounter of 04/01/19 (from the past 48 hour(s))  Basic metabolic panel     Status: Abnormal   Collection Time: 04/01/19 10:24 AM  Result Value Ref Range   Sodium 132 (L) 135 - 145 mmol/L   Potassium 4.2 3.5 - 5.1 mmol/L   Chloride 92 (L) 98 - 111 mmol/L   CO2 25 22 - 32 mmol/L   Glucose, Bld 161 (H) 70 - 99 mg/dL   BUN 31 (H) 6 - 20 mg/dL   Creatinine, Ser 7.36 (H) 0.61 - 1.24 mg/dL   Calcium 9.1 8.9 - 10.3 mg/dL   GFR calc non Af Amer 8 (L) >60 mL/min   GFR calc Af Amer 9 (L) >60 mL/min   Anion gap 15 5 - 15    Comment: Performed at [facility], [address], [place], [place] [address]  CBC     Status: Abnormal  Collection Time: 04/01/19 10:24 AM  Result Value Ref Range   WBC 5.4 4.0 - 10.5 K/uL   RBC 3.91 (L) 4.22 - 5.81 MIL/uL   Hemoglobin 12.5 (L) 13.0 - 17.0 g/dL   HCT 38.6 (L) 39.0 - 52.0 %   MCV 98.7 80.0 - 100.0 fL   MCH 32.0 26.0 - 34.0 pg   MCHC 32.4 30.0 - 36.0 g/dL   RDW 13.9 11.5 - 15.5 %   Platelets 146 (L) 150 - 400 K/uL   nRBC 0.0 0.0 - 0.2 %    Comment: Performed at [facility], [place], [place] [address]  Troponin I (High Sensitivity)     Status: Abnormal   Collection Time: 04/01/19 10:24 AM  Result Value Ref Range   Troponin I (High Sensitivity) 39 (H) <18 ng/L    Comment: (NOTE) Elevated high sensitivity troponin I (hsTnI) values and significant  changes across serial  measurements may suggest ACS but many other  chronic and acute conditions are known to elevate hsTnI results.  Refer to the "Links" section for chest pain algorithms and additional  guidance. Performed at [facility], [address] [place], [place] [address]   SARS Coronavirus 2 [facility] order, Performed in [facility] hospital lab) Nasopharyngeal Nasopharyngeal Swab     Status: None   Collection Time: 04/01/19 11:15 AM   Specimen: Nasopharyngeal Swab  Result Value Ref Range   SARS Coronavirus 2 NEGATIVE NEGATIVE    Comment: (NOTE) If result is NEGATIVE SARS-CoV-2 target nucleic acids are NOT DETECTED. The SARS-CoV-2 RNA is generally detectable in upper and lower  respiratory specimens during the acute phase of infection. The lowest  concentration of SARS-CoV-2 viral copies this assay can detect is 250  copies / mL. A negative result does not preclude SARS-CoV-2 infection  and should not be used as the sole basis for treatment or other  patient management decisions.  A negative result may occur with  improper specimen collection / handling, submission of specimen other  than nasopharyngeal swab, presence of viral mutation(s) within the  areas targeted by this assay, and inadequate number of viral copies  (<250 copies / mL). A negative result must be combined with clinical  observations, patient history, and epidemiological information. If result is POSITIVE SARS-CoV-2 target nucleic acids are DETECTED. The SARS-CoV-2 RNA is generally detectable in upper and lower  respiratory specimens dur ing the acute phase of infection.  Positive  results are indicative of active infection with SARS-CoV-2.  Clinical  correlation with patient history and other diagnostic information is  necessary to determine patient infection status.  Positive results do  not rule out bacterial infection or co-infection with other viruses. If result is PRESUMPTIVE POSTIVE SARS-CoV-2 nucleic acids  MAY BE PRESENT.   A presumptive positive result was obtained on the submitted specimen  and confirmed on repeat testing.  While 2019 novel coronavirus  (SARS-CoV-2) nucleic acids may be present in the submitted sample  additional confirmatory testing may be necessary for epidemiological  and / or clinical management purposes  to differentiate between  SARS-CoV-2 and other Sarbecovirus currently known to infect humans.  If clinically indicated additional testing with an alternate test  methodology [phone]) is advised. The SARS-CoV-2 RNA is generally  detectable in upper and lower respiratory sp ecimens during the acute  phase of infection. The expected result is Negative. Fact Sheet for Patients:  StrictlyIdeas.no Fact Sheet for Healthcare Providers: BankingDealers.co.za This test is not yet approved  or cleared by the [place] and has been authorized for detection and/or diagnosis of SARS-CoV-2 by FDA under an Emergency Use Authorization (EUA).  This EUA will remain in effect (meaning this test can be used) for the duration of the COVID-19 declaration under Section 564(b)(1) of the Act, 21 U.S.C. section 360bbb-3(b)(1), unless the authorization is terminated or revoked sooner. Performed at [facility], [place], [place] [address]   Troponin I (High Sensitivity)     Status: Abnormal   Collection Time: 04/01/19  2:01 PM  Result Value Ref Range   Troponin I (High Sensitivity) 38 (H) <18 ng/L    Comment: (NOTE) Elevated high sensitivity troponin I (hsTnI) values and significant  changes across serial measurements may suggest ACS but many other  chronic and acute conditions are known to elevate hsTnI results.  Refer to the "Links" section for chest pain algorithms and additional  guidance. Performed at [facility], [address], [place],  [address]    Ct Cervical Spine Wo  Contrast  Result Date: 04/01/2019 CLINICAL DATA:  Neck pain. Bilateral arm numbness. Dialysis patient EXAM: CT CERVICAL SPINE WITHOUT CONTRAST TECHNIQUE: Multidetector CT imaging of the cervical spine was performed without intravenous contrast. Multiplanar CT image reconstructions were also generated. COMPARISON:  CT cervical spine 03/23/2018 FINDINGS: Alignment: Mild retrolisthesis C3-4. Mild anterolisthesis C4-5. Marked kyphosis centered at C6-7 measuring 31 degrees. Kyphosis has progressed since the prior study due to progressive erosive changes of C6 and C7 vertebral bodies. Skull base and vertebrae: Erosive endplate changes at G8-6, C6-7, C7-T1 most likely due to spondyloarthropathy of dialysis. Progressive changes at C6-7 since the prior CT Soft tissues and spinal canal: Negative Disc levels:  C2-3: Negative C3-4: Well corticated erosive changes in the endplates at P6-1 similar to the prior study. No significant spinal stenosis C4-5: Negative C5-6: Negative C6-7: Erosive changes in the endplates bilaterally has progressed in the interval. There are erosive changes with loss of vertebral body volume of C6 and C7. Only a small amount of vertebral bodies present in the inferior endplate of C7. There is marked kyphosis at this level. Posterior spurring is present with mild spinal stenosis. Left facet subluxation with either degenerative or erosive changes in the facet. Possible instability. C7-T1: Erosive endplate changes similar to the prior study. 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 Marked progression of bony erosion at C6-7 with 31 degrees of kyphosis which has progressed in the interval. Subluxation of the left C6-7 facet which may be degenerative or due to erosive arthropathy. Surgical consultation is suggested. These results were called by telephone at the  time of interpretation on 04/01/2019 at 3:13 pm to Dr. Quentin Cornwall , who verbally acknowledged these results. Electronically Signed   By: Franchot Gallo M.D.   On: 04/01/2019 15:14   Dg Chest Portable 1 View  Result Date: 04/01/2019 CLINICAL DATA:  Pain between the shoulder blades and intermittent bilateral arm numbness for 2 weeks. EXAM: PORTABLE CHEST 1 VIEW COMPARISON:  Single-view of the chest 05/01/2018. FINDINGS: There is cardiomegaly and mild interstitial edema. No consolidative process, pneumothorax or effusion. Atherosclerosis noted. No acute or focal bony abnormality. IMPRESSION: Cardiomegaly mild interstitial edema. Atherosclerosis. Electronically Signed   By: Inge Rise M.D.   On: 04/01/2019 11:15    Pending Labs [employer] (From admission, onward)    Start  Ordered   04/01/19 0959  Urinalysis, Complete w Microscopic  ONCE - STAT,   STAT     04/01/19 0958   Signed and Held  Phosphorus  Tomorrow morning,   R     Signed and Held          Vitals/Pain Today's Vitals   04/01/19 1630 04/01/19 1730 04/01/19 1830 04/01/19 1900  BP: (!) 170/70 (!) 164/59 (!) 166/60 (!) 211/86  Pulse: (!) 59 60 61 71  Resp: 16 18 15 20   Temp:      TempSrc:      SpO2: 100% 100% 100% 100%  Weight:      Height:      PainSc:        Isolation Precautions No active isolations  Medications Medications  fentaNYL (SUBLIMAZE) injection 50 mcg (50 mcg Intravenous Given 04/01/19 1353)  Chlorhexidine Gluconate Cloth 2 % PADS 6 each (has no administration in time range)  heparin injection 5,000 Units (5,000 Units Subcutaneous Given 04/01/19 1621)  hydrALAZINE (APRESOLINE) injection 10 mg (has no administration in time range)  labetalol (NORMODYNE) injection 10 mg (has no administration in time range)  sodium chloride flush (NS) 0.9 % injection 3 mL (3 mLs Intravenous Given 04/01/19 1111)  HYDROcodone-acetaminophen (NORCO/VICODIN) 5-325 MG per tablet 1 tablet (1 tablet Oral Given 04/01/19 1145)   labetalol (NORMODYNE) injection 10 mg (10 mg Intravenous Given 04/01/19 1355)  labetalol (NORMODYNE) injection 10 mg (10 mg Intravenous Given 04/01/19 1621)    Mobility walks Moderate fall risk   Focused Assessments -   R Recommendations: See Admitting Provider Note  Report given to:   Additional Notes: -

## 2019-04-01 NOTE — ED Notes (Signed)
Interpreter arrived to bedside. EDP Quentin Cornwall notified in person.

## 2019-04-01 NOTE — H&P (Signed)
[facility] [place] NAME: Mario [name]    MR#:  [id number]  DATE OF BIRTH:  15-Sep-1970  DATE OF ADMISSION:  04/01/2019  PRIMARY CARE PHYSICIAN: [name], MD   REQUESTING/REFERRING PHYSICIAN:   CHIEF COMPLAINT:   Chief Complaint  Patient presents with   Weakness    HISTORY OF PRESENT ILLNESS:   48 year old male with past medical history of ESRD on HD TTS, liver cirrhosis, hypertension, chronic combined systolic and diastolic CHF ED with complaints of bilateral shoulder pain and numbness to both arms.  Patient report onset of symptoms since 10/2018 with progressive worsening in the last 3 weeks. He was seen in the ED on  02/22/2019 for left shoulder pain predominantly in the right scapular region. Patient state since discharge from the ED he has continued to have left shoulder pain now involving the right scapular region. Patient report new symptoms of numbness and weakness in both arm for the past 2 weeks. He is unable to hold or grip objects. Patient states he keeps dropping objects due to weakness.  On arrival to the ED, he was afebrile with blood pressure 204/68 mm Hg and pulse rate 73 beats/min. There were no focal neurological deficits; he was alert and oriented x 4. Initial labs revealed sodium 132, BUN 31, creatinine 7.36, CBC at baseline.  Troponin 38.  Showed chest x-ray shows cardiomegaly with mild interstitial edema otherwise no acute finding.  PAST MEDICAL HISTORY:   Past Medical History:  Diagnosis Date   Abdominal pain 08/28/2016   Acute hyperkalemia 10/28/2016   Chest pain 04/11/2016   Chronic combined systolic and diastolic CHF (congestive heart failure) (HCC)    Cirrhosis (HCC)    Dialysis patient ([place])    ESRD (end stage renal disease) on dialysis ([place]) 01/25/2015   Fluid overload 04/12/2016   HTN (hypertension) 10/13/2016   Hyperkalemia 11/27/2016   Hypertension    Pulmonary edema 07/03/2016    Renal disorder    Renal insufficiency    Scrotal infection 01/25/2015    PAST SURGICAL HISTORY:   Past Surgical History:  Procedure Laterality Date   AV FISTULA PLACEMENT      SOCIAL HISTORY:   Social History   Tobacco Use   Smoking status: Current Every Day Smoker    Packs/day: 0.25    Years: 25.00    Pack years: 6.25    Types: Cigarettes   Smokeless tobacco: Never Used  Substance Use Topics   Alcohol use: No    Comment: pt denies    FAMILY HISTORY:   Family History  Problem Relation Age of Onset   Kidney failure Father    Diabetes Mother     DRUG ALLERGIES:   Allergies  Allergen Reactions   Betadine [Povidone Iodine] Rash    REVIEW OF SYSTEMS:   Review of Systems  Constitutional: Negative for chills, fever, malaise/fatigue and weight loss.  HENT: Negative for congestion, hearing loss and sore throat.   Eyes: Negative for blurred vision and double vision.  Respiratory: Negative for cough, shortness of breath and wheezing.   Cardiovascular: Negative for chest pain, palpitations, orthopnea and leg swelling.  Gastrointestinal: Negative for abdominal pain, diarrhea, nausea and vomiting.  Genitourinary: Negative for dysuria and urgency.  Musculoskeletal: Positive for back pain, falls, joint pain and neck pain. Negative for myalgias.  Skin: Negative for rash.  Neurological: Positive for tingling, sensory change and weakness. Negative for dizziness, speech change, focal weakness and headaches.  Psychiatric/Behavioral: Negative for depression.   MEDICATIONS AT HOME:   Prior to Admission medications   Medication Sig Start Date End Date Taking? Authorizing Provider  ferric citrate (AURYXIA) 1 GM 210 MG(Fe) tablet Take 420 mg by mouth 3 (three) times daily with meals.   Yes [provider]  traMADol (ULTRAM) 50 MG tablet Take 50 mg by mouth every 12 (twelve) hours as needed.   Yes [provider]      VITAL SIGNS:  Blood pressure (!)  211/86, pulse 71, temperature 98.6 F (37 C), temperature source Oral, resp. rate 20, height 5\' 5"  (1.651 m), weight 66 kg, SpO2 100 %.  PHYSICAL EXAMINATION:   Physical Exam  GENERAL:  48 y.o.-year-old patient lying in the bed with no acute distress.  EYES: Pupils equal, round, reactive to light and accommodation. No scleral icterus. Extraocular muscles intact.  HEENT: Head atraumatic, normocephalic. Oropharynx and nasopharynx clear.  NECK:  Supple, no jugular venous distention. No thyroid enlargement, no tenderness.  LUNGS: Normal breath sounds bilaterally, no wheezing, rales,rhonchi or crepitation. No use of accessory muscles of respiration.  CARDIOVASCULAR: S1, S2 normal. No murmurs, rubs, or gallops.  ABDOMEN: Soft, nontender, nondistended. Bowel sounds present. No organomegaly or mass.  EXTREMITIES: No pedal edema, cyanosis, or clubbing. No rash or lesions. + pedal pulses MUSCULOSKELETAL: Positive Spurling's test.  Decreased muscle strength, and power was 3+ grip and elbow, knee, and ankle flexion and extension bilaterally.  NEUROLOGIC:Alert and oriented x 3. CN 2-12 intact. Sensation to light touch and cold stimuli decreased bilaterally.  Babinski is absent.  Diminished DTR's (Triceps)  throughout. Gait not tested due to safety concern. PSYCHIATRIC: The patient is alert and oriented x 3.  SKIN: No obvious rash, lesion, or ulcer.   DATA REVIEWED:  LABORATORY PANEL:   CBC Recent Labs  Lab 04/01/19 1024  WBC 5.4  HGB 12.5*  HCT 38.6*  PLT 146*   ------------------------------------------------------------------------------------------------------------------  Chemistries  Recent Labs  Lab 04/01/19 1024  NA 132*  K 4.2  CL 92*  CO2 25  GLUCOSE 161*  BUN 31*  CREATININE 7.36*  CALCIUM 9.1   ------------------------------------------------------------------------------------------------------------------  Cardiac Enzymes No results for input(s): TROPONINI in the  last 168 hours. ------------------------------------------------------------------------------------------------------------------  RADIOLOGY:  Ct Cervical Spine Wo Contrast  Result Date: 04/01/2019 CLINICAL DATA:  Neck pain. Bilateral arm numbness. Dialysis patient EXAM: CT CERVICAL SPINE WITHOUT CONTRAST TECHNIQUE: Multidetector CT imaging of the cervical spine was performed without intravenous contrast. Multiplanar CT image reconstructions were also generated. COMPARISON:  CT cervical spine 03/23/2018 FINDINGS: Alignment: Mild retrolisthesis C3-4. Mild anterolisthesis C4-5. Marked kyphosis centered at C6-7 measuring 31 degrees. Kyphosis has progressed since the prior study due to progressive erosive changes of C6 and C7 vertebral bodies. Skull base and vertebrae: Erosive endplate changes at H8-8, C6-7, C7-T1 most likely due to spondyloarthropathy of dialysis. Progressive changes at C6-7 since the prior CT Soft tissues and spinal canal: Negative Disc levels:  C2-3: Negative C3-4: Well corticated erosive changes in the endplates at F0-2 similar to the prior study. No significant spinal stenosis C4-5: Negative C5-6: Negative C6-7: Erosive changes in the endplates bilaterally has progressed in the interval. There are erosive changes with loss of vertebral body volume of C6 and C7. Only a small amount of vertebral bodies present in the inferior endplate of C7. There is marked kyphosis at this level. Posterior spurring is present with mild spinal stenosis. Left facet subluxation with either degenerative or erosive changes in the facet. Possible instability. C7-T1:  Erosive endplate changes similar to the prior study. 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 Marked progression of bony erosion at C6-7 with 31 degrees of kyphosis which has progressed in the interval.  Subluxation of the left C6-7 facet which may be degenerative or due to erosive arthropathy. Surgical consultation is suggested. These results were called by telephone at the time of interpretation on 04/01/2019 at 3:13 pm to Dr. Quentin Cornwall , who verbally acknowledged these results. Electronically Signed   By: Franchot Gallo M.D.   On: 04/01/2019 15:14   Dg Chest Portable 1 View  Result Date: 04/01/2019 CLINICAL DATA:  Pain between the shoulder blades and intermittent bilateral arm numbness for 2 weeks. EXAM: PORTABLE CHEST 1 VIEW COMPARISON:  Single-view of the chest 05/01/2018. FINDINGS: There is cardiomegaly and mild interstitial edema. No consolidative process, pneumothorax or effusion. Atherosclerosis noted. No acute or focal bony abnormality. IMPRESSION: Cardiomegaly mild interstitial edema. Atherosclerosis. Electronically Signed   By: Inge Rise M.D.   On: 04/01/2019 11:15   EKG:  EKG: normal EKG, normal sinus rhythm, unchanged from previous tracings. Vent. rate 80 BPM PR interval 150 ms QRS duration 90 ms QT/QTc 410/472 ms P-R-T axes 69 -20 25 IMPRESSION AND PLAN:   48 y.o. male t medical history of ESRD on HD TTS, liver cirrhosis, hypertension, chronic combined systolic and diastolic CHF ED with complaints of bilateral shoulder pain and numbness to both arms.  1. Cervical Radiculopathy -  Patient presenting with neurological deficits numbness, altered reflexes, and weakness radiating  from the neck into bilateral shoulder, arm, hand, or fingers - Admit to medsurg floor pending transfer to [facility] - CT cervical spine shows Marked progression of bony erosion at C6-7 and subluxation of the left C6-7 facet. - Apply cervical collar - PRN pain management - Findings discussed with Neurosurgeon Dr. Cari Caraway who recommends patient be transferred to [facility] when bed becomes available for further management. Currently [facility] is on diversion, attempted transfer to [facility] however per Neurosurgeon Dr.  Wende Mott, patient was not a surgical candidate.  2. Hypertensive urgency -  Acute elevation in Bp with associated target-organ dysfunction - PRN hydralazine or IV labetalol  3. Acute kidney injury on CKD  -likely secondary to accelerated hypertension  - ESRD on HD  TTS - Avoid nephrotoxins - Treat hypertension as above - Continue to monitor renal functions - Nephrology consult, discussed with Dr. Candiss Norse plan for HD tomorrow a.m.  4. Elevated troponin -likely due to demand ischemia from hypertensive crisis  - Will continue to trend troponins  5. DVT prophylaxis - Heparin  SubQ     All the records are reviewed and case discussed with ED provider. Management plans discussed with the patient, family and they are in agreement.  CODE STATUS: FULL  TOTAL TIME TAKING CARE OF THIS PATIENT: 50 minutes.    on 04/01/2019 at 7:27 PM  Rufina Falco, DNP, FNP-BC [facility] Nurse Practitioner Between 7am to 6pm - Pager [phone]  After 6pm go to [email or web address] - Proofreader  [facility]  Office  [phone]  CC: Primary care physician; [name], MD

## 2019-04-01 NOTE — ED Notes (Signed)
Interpreter request created for MD to update pt.

## 2019-04-01 NOTE — ED Triage Notes (Signed)
Patient states he is dialysis patient. Next dialysis treatment Saturday. Patient states for the last 2 weeks, he has had pain between shoulder blades and numbness to both arms intermittently. Patient states he has been increasingly weak and fatigued as well.

## 2019-04-01 NOTE — Progress Notes (Signed)
[facility], [place] 04/01/19  Subjective:   Patient known to our practice from previous admission Presents to the emergency room complaining of pain between the shoulder blades and numbness in the arms.  Found to have extremely elevated blood pressure.  Now being admitted for further management.  Patient states he is compliant with his dialysis and went to the treatment yesterday.  He does have a pack of cigarettes in his medication bag.  Current smoker.  Objective:  Vital signs in last 24 hours:  Temp:  [98.6 F (37 C)] 98.6 F (37 C) (08/28 0940) Pulse Rate:  [60-69] 60 (08/28 1401) Resp:  [13-21] 13 (08/28 1401) BP: (170-205)/(66-78) 181/76 (08/28 1401) SpO2:  [99 %-100 %] 99 % (08/28 1401) Weight:  [66 kg] 66 kg (08/28 0941)  Weight change:  Filed Weights   04/01/19 0941  Weight: 66 kg    Intake/Output:   No intake or output data in the 24 hours ending 04/01/19 1406   Physical Exam: General:  No acute distress, laying in the bed  HEENT  anicteric, moist oral mucous membranes  Neck  supple, no masses  Pulm/lungs  normal breathing effort, clear to auscultation, no crackles  CVS/Heart  regular, no rub  Abdomen:   Soft, nontender  Extremities:  No peripheral edema  Neurologic:  Alert, able to answer questions  Skin:  No rashes  Access:  Left arm aneurysmal fistula       Basic Metabolic Panel:  Recent Labs  Lab 04/01/19 1024  NA 132*  K 4.2  CL 92*  CO2 25  GLUCOSE 161*  BUN 31*  CREATININE 7.36*  CALCIUM 9.1     CBC: Recent Labs  Lab 04/01/19 1024  WBC 5.4  HGB 12.5*  HCT 38.6*  MCV 98.7  PLT 146*      Lab Results  Component Value Date   HEPBSAG Negative 05/02/2018   HEPBIGM Negative 05/02/2018      Microbiology:  Recent Results (from the past 240 hour(s))  SARS Coronavirus 2 [facility] order, Performed in [facility] hospital lab) Nasopharyngeal Nasopharyngeal Swab     Status: None   Collection Time:  04/01/19 11:15 AM   Specimen: Nasopharyngeal Swab  Result Value Ref Range Status   SARS Coronavirus 2 NEGATIVE NEGATIVE Final    Comment: (NOTE) If result is NEGATIVE SARS-CoV-2 target nucleic acids are NOT DETECTED. The SARS-CoV-2 RNA is generally detectable in upper and lower  respiratory specimens during the acute phase of infection. The lowest  concentration of SARS-CoV-2 viral copies this assay can detect is 250  copies / mL. A negative result does not preclude SARS-CoV-2 infection  and should not be used as the sole basis for treatment or other  patient management decisions.  A negative result may occur with  improper specimen collection / handling, submission of specimen other  than nasopharyngeal swab, presence of viral mutation(s) within the  areas targeted by this assay, and inadequate number of viral copies  (<250 copies / mL). A negative result must be combined with clinical  observations, patient history, and epidemiological information. If result is POSITIVE SARS-CoV-2 target nucleic acids are DETECTED. The SARS-CoV-2 RNA is generally detectable in upper and lower  respiratory specimens dur ing the acute phase of infection.  Positive  results are indicative of active infection with SARS-CoV-2.  Clinical  correlation with patient history and other diagnostic information is  necessary to determine patient infection status.  Positive results do  not rule  out bacterial infection or co-infection with other viruses. If result is PRESUMPTIVE POSTIVE SARS-CoV-2 nucleic acids MAY BE PRESENT.   A presumptive positive result was obtained on the submitted specimen  and confirmed on repeat testing.  While 2019 novel coronavirus  (SARS-CoV-2) nucleic acids may be present in the submitted sample  additional confirmatory testing may be necessary for epidemiological  and / or clinical management purposes  to differentiate between  SARS-CoV-2 and other Sarbecovirus currently known to  infect humans.  If clinically indicated additional testing with an alternate test  methodology [phone]) is advised. The SARS-CoV-2 RNA is generally  detectable in upper and lower respiratory sp ecimens during the acute  phase of infection. The expected result is Negative. Fact Sheet for Patients:  StrictlyIdeas.no Fact Sheet for Healthcare Providers: BankingDealers.co.za This test is not yet approved or cleared by the [place] FDA and has been authorized for detection and/or diagnosis of SARS-CoV-2 by FDA under an Emergency Use Authorization (EUA).  This EUA will remain in effect (meaning this test can be used) for the duration of the COVID-19 declaration under Section 564(b)(1) of the Act, 21 U.S.C. section 360bbb-3(b)(1), unless the authorization is terminated or revoked sooner. Performed at [facility], [address] [place], [place] [address]     Coagulation Studies: No results for input(s): LABPROT, INR in the last 72 hours.  Urinalysis: No results for input(s): COLORURINE, LABSPEC, PHURINE, GLUCOSEU, HGBUR, BILIRUBINUR, KETONESUR, PROTEINUR, UROBILINOGEN, NITRITE, LEUKOCYTESUR in the last 72 hours.  Invalid input(s): APPERANCEUR    Imaging: Dg Chest Portable 1 View  Result Date: 04/01/2019 CLINICAL DATA:  Pain between the shoulder blades and intermittent bilateral arm numbness for 2 weeks. EXAM: PORTABLE CHEST 1 VIEW COMPARISON:  Single-view of the chest 05/01/2018. FINDINGS: There is cardiomegaly and mild interstitial edema. No consolidative process, pneumothorax or effusion. Atherosclerosis noted. No acute or focal bony abnormality. IMPRESSION: Cardiomegaly mild interstitial edema. Atherosclerosis. Electronically Signed   By: Inge Rise M.D.   On: 04/01/2019 11:15     Medications:    . labetalol  10 mg Intravenous Once   fentaNYL (SUBLIMAZE) injection  Assessment/ Plan:  48 y.o. male with end  stage renal disease on hemodialysis, hypertension, hepatic alcoholic cirrhosis, congestive heart failure  [facility]  [facility]. [facility] 2. A[facility] 3. HTN urgency, malignant HTN 4. SHPTH 5.  Current smoker  Plan: Volume status is acceptable.  Potassium is normal.  Restart home medications and monitor blood pressure.    No acute indication for dialysis at present.  Will arrange for dialysis tomorrow.  Advised to quit smoking    LOS: 0 Lennart Gladish 8/28/20202:06 PM  [place], [place]  Note: This note was prepared with Dragon dictation. Any transcription errors are unintentional

## 2019-04-01 NOTE — ED Provider Notes (Signed)
[facility] currently on diversion.  I reached out to [facility] for neurosurg evaluation, Dr. Vena Rua of neurosurgery reviewed image and felt no indication for emergent surgery.  Felt to be appropriate for outpatient follow up, I relayed other issues with noncompliance and that he was planned to be admitted here medically.  Patient in cervical collar.  Discussed with hospitalist. Will consider admission here for mri, medical eval and neurosurgery consult vs transfer to [place].   Merlyn Lot, MD 04/01/19 [phone]

## 2019-04-02 LAB — PHOSPHORUS: Phosphorus: 7.8 mg/dL — ABNORMAL HIGH (ref 2.5–4.6)

## 2019-04-02 MED ORDER — CALCIUM ACETATE (PHOS BINDER) 667 MG PO CAPS
667.0000 mg | ORAL_CAPSULE | Freq: Three times a day (TID) | ORAL | Status: DC
  Administered 2019-04-02: 667 mg via ORAL
  Filled 2019-04-02: qty 1

## 2019-04-02 MED ORDER — MORPHINE SULFATE (PF) 2 MG/ML IV SOLN
2.0000 mg | INTRAVENOUS | Status: DC | PRN
  Administered 2019-04-02 – 2019-04-03 (×4): 2 mg via INTRAVENOUS
  Filled 2019-04-02 (×4): qty 1

## 2019-04-02 MED ORDER — DEXAMETHASONE SODIUM PHOSPHATE 10 MG/ML IJ SOLN
8.0000 mg | Freq: Four times a day (QID) | INTRAMUSCULAR | Status: DC
  Administered 2019-04-02 (×3): 8 mg via INTRAVENOUS
  Filled 2019-04-02 (×5): qty 0.8

## 2019-04-02 MED ORDER — HYDROCODONE-ACETAMINOPHEN 5-325 MG PO TABS
1.0000 | ORAL_TABLET | Freq: Four times a day (QID) | ORAL | Status: DC | PRN
  Administered 2019-04-02 (×2): 1 via ORAL
  Filled 2019-04-02 (×2): qty 1

## 2019-04-02 NOTE — Progress Notes (Signed)
Pt c/o 9/10 pain in bilateral shoulders and left side. Pt had order for fentanyl 32mcg every hour. Order was given for norco q6h and morphine q4h. Norco and Fentanyl given at 1830 with relief from 8/10 to 6/10.

## 2019-04-02 NOTE — Progress Notes (Signed)
[facility] also declined transfer.

## 2019-04-02 NOTE — Progress Notes (Signed)
[address] Mario [name]  MRN: [id number]  DOB/AGE: Jan 22, 1971 48 y.o.  Primary Care Physician:Lateef, Munsoor, MD  Admit date: 04/01/2019  Chief Complaint:  Chief Complaint  Patient presents with  . Weakness    S-Pt presented on  04/01/2019 with  Chief Complaint  Patient presents with  . Weakness  .    Pt seen on Dialysis. Pt offers no new complaints. Pt speaks minimal english but able to respond to direct questions .Pt today feels better   . Chlorhexidine Gluconate Cloth  6 each Topical Q0600  . heparin  5,000 Units Subcutaneous Q8H         MVE:HMCNO from the symptoms mentioned above,there are no other symptoms referable to all systems reviewed.  Physical Exam: Vital signs in last 24 hours: Temp:  [97.5 F (36.4 C)-98.2 F (36.8 C)] 98.2 F (36.8 C) (08/29 0945) Pulse Rate:  [57-71] 67 (08/29 0411) Resp:  [10-21] 16 (08/29 0411) BP: (150-227)/(50-86) 159/64 (08/29 0411) SpO2:  [97 %-100 %] 100 % (08/29 0411) Weight:  [65.7 kg] 65.7 kg (08/28 2224) Weight change:  Last BM Date: 03/31/19  Intake/Output from previous day: No intake/output data recorded. No intake/output data recorded.   Physical Exam: General- pt is awake,alert,follows commands. Resp- No acute REsp distress, CTA B/L NO Rhonchi CVS- S1S2 regular in rate and rhythm GIT- BS+, soft, NT, ND EXT- NO LE Edema, Cyanosis Access -Left AVF two needles in situ  Lab Results: CBC Recent Labs    04/01/19 1024  WBC 5.4  HGB 12.5*  HCT 38.6*  PLT 146*    BMET Recent Labs    04/01/19 1024  NA 132*  K 4.2  CL 92*  CO2 25  GLUCOSE 161*  BUN 31*  CREATININE 7.36*  CALCIUM 9.1    MICRO Recent Results (from the past 240 hour(s))  SARS Coronavirus 2 [facility] order, Performed in [facility] hospital lab) Nasopharyngeal Nasopharyngeal Swab     Status: None   Collection Time: 04/01/19 11:15 AM   Specimen: Nasopharyngeal Swab  Result Value Ref Range Status   SARS Coronavirus 2 NEGATIVE  NEGATIVE Final    Comment: (NOTE) If result is NEGATIVE SARS-CoV-2 target nucleic acids are NOT DETECTED. The SARS-CoV-2 RNA is generally detectable in upper and lower  respiratory specimens during the acute phase of infection. The lowest  concentration of SARS-CoV-2 viral copies this assay can detect is 250  copies / mL. A negative result does not preclude SARS-CoV-2 infection  and should not be used as the sole basis for treatment or other  patient management decisions.  A negative result may occur with  improper specimen collection / handling, submission of specimen other  than nasopharyngeal swab, presence of viral mutation(s) within the  areas targeted by this assay, and inadequate number of viral copies  (<250 copies / mL). A negative result must be combined with clinical  observations, patient history, and epidemiological information. If result is POSITIVE SARS-CoV-2 target nucleic acids are DETECTED. The SARS-CoV-2 RNA is generally detectable in upper and lower  respiratory specimens dur ing the acute phase of infection.  Positive  results are indicative of active infection with SARS-CoV-2.  Clinical  correlation with patient history and other diagnostic information is  necessary to determine patient infection status.  Positive results do  not rule out bacterial infection or co-infection with other viruses. If result is PRESUMPTIVE POSTIVE SARS-CoV-2 nucleic acids MAY BE PRESENT.   A presumptive positive result was obtained on the submitted specimen  and confirmed on repeat testing.  While 2019 novel coronavirus  (SARS-CoV-2) nucleic acids may be present in the submitted sample  additional confirmatory testing may be necessary for epidemiological  and / or clinical management purposes  to differentiate between  SARS-CoV-2 and other Sarbecovirus currently known to infect humans.  If clinically indicated additional testing with an alternate test  methodology [phone]) is  advised. The SARS-CoV-2 RNA is generally  detectable in upper and lower respiratory sp ecimens during the acute  phase of infection. The expected result is Negative. Fact Sheet for Patients:  StrictlyIdeas.no Fact Sheet for Healthcare Providers: BankingDealers.co.za This test is not yet approved or cleared by the [place] FDA and has been authorized for detection and/or diagnosis of SARS-CoV-2 by FDA under an Emergency Use Authorization (EUA).  This EUA will remain in effect (meaning this test can be used) for the duration of the COVID-19 declaration under Section 564(b)(1) of the Act, 21 U.S.C. section 360bbb-3(b)(1), unless the authorization is terminated or revoked sooner. Performed at [facility], [address] [place], [place] [address]       Lab Results  Component Value Date   PTH [phone] (H) 01/06/2017   CALCIUM 9.1 04/01/2019   PHOS 7.8 (H) 04/02/2019       Impression: 1)Renal  ESRD on HD                Pt is on TTS schedule                Pt is being dialyzed today  2)HTN BP better than before   3)Anemia HGb at goal (9--11)   4)CKD Mineral-Bone Disorder Secondary Hyperparathyroidism present. Phosphorus not at goal.   5)CNS-admitted with cervical radiculopathy Planning to transfer to [facility] for neurosurgery Primary MD following  6)Electrolytes  Normokalemic  Hyponatremic    Sec to ESRD  7)Acid base Co2 at goal     Plan:  Will dialyze today Will start on binders    Kam Kushnir S 04/02/2019, 10:52 AM

## 2019-04-02 NOTE — Progress Notes (Signed)
Treatment completed removed 1363ml no c/o to note   04/02/19 1315  Hand-Off documentation  Report given to (Full Name) tia leach  Report received from (Full Name) Taejon Irani  Vital Signs  Temp 98.2 F (36.8 C)  Temp Source Oral  Pulse Rate 66  Resp 16  BP (!) 187/76  Oxygen Therapy  SpO2 100 %  O2 Device Room Air  Pain Assessment  Pain Scale 0-10  Pain Score 0  During Hemodialysis Assessment  Blood Flow Rate (mL/min) 150 mL/min  Arterial Pressure (mmHg) 10 mmHg  Venous Pressure (mmHg) 50 mmHg  Transmembrane Pressure (mmHg) 40 mmHg  Ultrafiltration Rate (mL/min) 0 mL/min  Dialysate Flow Rate (mL/min) 600 ml/min  Conductivity: Machine  14.3  HD Safety Checks Performed Yes  Dialysis Fluid Bolus Normal Saline  Bolus Amount (mL) 250 mL  Intra-Hemodialysis Comments Tolerated well;Tx completed  Post-Hemodialysis Assessment  Rinseback Volume (mL) 250 mL  Dialyzer Clearance Clear  Duration of HD Treatment -hour(s) 320 hour(s)  Hemodialysis Intake (mL) 500 mL  UF Total -Machine (mL) 1867 mL  Net UF (mL) 1367 mL  Tolerated HD Treatment Yes  Post-Hemodialysis Comments off 10 minutes early per  pt request states that he was hungry  AVG/AVF Arterial Site Held (minutes) 10 minutes  AVG/AVF Venous Site Held (minutes) 10 minutes  Education / Care Plan  Dialysis Education Provided Yes  Documented Education in Care Plan Yes  Fistula / Graft Left Upper arm Arteriovenous fistula  No Placement Date or Time found.   Orientation: Left  Access Location: Upper arm  Access Type: Arteriovenous fistula  Fistula / Graft Assessment Present  Status Deaccessed  Drainage Description None

## 2019-04-02 NOTE — Progress Notes (Signed)
[facility] [place] NAME: Mario [name]    MR#:  [id number]  DATE OF BIRTH:  18-Nov-1970  SUBJECTIVE:  CHIEF COMPLAINT:   Chief Complaint  Patient presents with   Weakness  still has weakness in UE, interpretor at bedside, patient requesting pain meds REVIEW OF SYSTEMS:  Review of Systems  Constitutional: Negative for diaphoresis, fever, malaise/fatigue and weight loss.  HENT: Negative for ear discharge, ear pain, hearing loss, nosebleeds, sore throat and tinnitus.   Eyes: Negative for blurred vision and pain.  Respiratory: Negative for cough, hemoptysis, shortness of breath and wheezing.   Cardiovascular: Negative for chest pain, palpitations, orthopnea and leg swelling.  Gastrointestinal: Negative for abdominal pain, blood in stool, constipation, diarrhea, heartburn, nausea and vomiting.  Genitourinary: Negative for dysuria, frequency and urgency.  Musculoskeletal: Positive for neck pain. Negative for back pain and myalgias.  Skin: Negative for itching and rash.  Neurological: Positive for sensory change and focal weakness. Negative for dizziness, tingling, tremors, seizures, weakness and headaches.  Psychiatric/Behavioral: Negative for depression. The patient is not nervous/anxious.     DRUG ALLERGIES:   Allergies  Allergen Reactions   Betadine [Povidone Iodine] Rash   VITALS:  Blood pressure (!) 178/74, pulse 65, temperature 98.2 F (36.8 C), temperature source Oral, resp. rate 14, height 5\' 5"  (1.651 m), weight 65.7 kg, SpO2 100 %. PHYSICAL EXAMINATION:  Physical Exam HENT:     Head: Normocephalic and atraumatic.  Eyes:     Conjunctiva/sclera: Conjunctivae normal.     Pupils: Pupils are equal, round, and reactive to light.  Neck:     Musculoskeletal: Normal range of motion and neck supple.     Thyroid: No thyromegaly.     Trachea: No tracheal deviation.  Cardiovascular:     Rate and Rhythm: Normal rate and  regular rhythm.     Heart sounds: Normal heart sounds.  Pulmonary:     Effort: Pulmonary effort is normal. No respiratory distress.     Breath sounds: Normal breath sounds. No wheezing.  Chest:     Chest wall: No tenderness.  Abdominal:     General: Bowel sounds are normal. There is no distension.     Palpations: Abdomen is soft.     Tenderness: There is no abdominal tenderness.  Musculoskeletal: Normal range of motion.  Skin:    General: Skin is warm and dry.     Findings: No rash.  Neurological:     Mental Status: He is alert and oriented to person, place, and time.     Cranial Nerves: No cranial nerve deficit.     Sensory: Sensory deficit present.     Motor: Weakness present.    LABORATORY PANEL:  Male CBC Recent Labs  Lab 04/01/19 1024  WBC 5.4  HGB 12.5*  HCT 38.6*  PLT 146*   ------------------------------------------------------------------------------------------------------------------ Chemistries  Recent Labs  Lab 04/01/19 1024  NA 132*  K 4.2  CL 92*  CO2 25  GLUCOSE 161*  BUN 31*  CREATININE 7.36*  CALCIUM 9.1   RADIOLOGY:  Ct Cervical Spine Wo Contrast  Result Date: 04/01/2019 CLINICAL DATA:  Neck pain. Bilateral arm numbness. Dialysis patient EXAM: CT CERVICAL SPINE WITHOUT CONTRAST TECHNIQUE: Multidetector CT imaging of the cervical spine was performed without intravenous contrast. Multiplanar CT image reconstructions were also generated. COMPARISON:  CT cervical spine 03/23/2018 FINDINGS: Alignment: Mild retrolisthesis C3-4. Mild anterolisthesis C4-5. Marked kyphosis centered at C6-7 measuring 31 degrees. Kyphosis has progressed  since the prior study due to progressive erosive changes of C6 and C7 vertebral bodies. Skull base and vertebrae: Erosive endplate changes at Y8-6, C6-7, C7-T1 most likely due to spondyloarthropathy of dialysis. Progressive changes at C6-7 since the prior CT Soft tissues and spinal canal: Negative Disc levels:  C2-3: Negative  C3-4: Well corticated erosive changes in the endplates at V7-8 similar to the prior study. No significant spinal stenosis C4-5: Negative C5-6: Negative C6-7: Erosive changes in the endplates bilaterally has progressed in the interval. There are erosive changes with loss of vertebral body volume of C6 and C7. Only a small amount of vertebral bodies present in the inferior endplate of C7. There is marked kyphosis at this level. Posterior spurring is present with mild spinal stenosis. Left facet subluxation with either degenerative or erosive changes in the facet. Possible instability. C7-T1: Erosive endplate changes similar to the prior study. 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 Marked progression of bony erosion at C6-7 with 31 degrees of kyphosis which has progressed in the interval. Subluxation of the left C6-7 facet which may be degenerative or due to erosive arthropathy. Surgical consultation is suggested. These results were called by telephone at the time of interpretation on 04/01/2019 at 3:13 pm to Dr. Quentin Cornwall , who verbally acknowledged these results. Electronically Signed   By: Franchot Gallo M.D.   On: 04/01/2019 15:14   ASSESSMENT AND PLAN:  48 y.o. male medical history of ESRD on HD TTS, liver cirrhosis, hypertension, chronic combined systolic and diastolic CHF ED with complaints of bilateral shoulder pain and numbness to both arms.  1. Cervical Radiculopathy -  Patient presenting with neurological deficits numbness, altered reflexes, and weakness radiating  from the neck into bilateral shoulder, arm, hand, or fingers - [facility] didn't accept transfer - CT cervical spine shows Marked progression of bony erosion at C6-7 and subluxation of the left C6-7 facet. - Keep cervical collar - PRN pain management - Findings discussed with Neurosurgeon Dr.  Cari Caraway who recommends patient be transferred to tertiary care - he will need anesthesia clearance for neuro surgical procedure for his cervical issue  2. Hypertensive urgency - resolved. Likely due to pain - PRN hydralazine or IV labetalol  3. ESRD on HD  TTS - nephro following. Getting HD Today  4. Elevated troponin -likely due to demand ischemia from hypertensive crisis  - trend troponins  I've discussed case with Dr Izora Ribas. [facility] who didn't accept. [facility] response.     All the records are reviewed and case discussed with Care Management/Social Worker. Management plans discussed with the patient, nursing and they are in agreement.  CODE STATUS: Full Code  TOTAL TIME TAKING CARE OF THIS PATIENT: 35 minutes.   More than 50% of the time was spent in counseling/coordination of care: YES  POSSIBLE D/C IN 1-2 DAYS, DEPENDING ON CLINICAL CONDITION.   Max Sane M.D on 04/02/2019 at 1:16 PM  Between 7am to 6pm - Pager - [phone]  After 6pm go to [email or web address] - Proofreader  [facility]  Office  [phone]  CC: Primary care physician; [name], MD  Note: This dictation was prepared with Dragon dictation along with smaller phrase technology. Any transcriptional errors that result from this process are unintentional.

## 2019-04-02 NOTE — Progress Notes (Addendum)
[facility] is willing for transfer there. Waiting for [facility] to accept transfer there per Neurosurgery request.  Dr Arnetha Massy (neurosurgery) also requested to start decadron which I've ordered.  Accepted by [facility] now. And waiting for bed.

## 2019-04-02 NOTE — Progress Notes (Signed)
   04/02/19 0800  Clinical Encounter Type  Visited With Patient  Visit Type Initial  Referral From Nurse  Consult/Referral To Chaplain  Spiritual Encounters  Spiritual Needs Emotional  Stress Factors  Patient Stress Factors Exhausted;Loss

## 2019-04-03 ENCOUNTER — Inpatient Hospital Stay (HOSPITAL_COMMUNITY): Payer: Medicaid Other

## 2019-04-03 ENCOUNTER — Encounter (HOSPITAL_COMMUNITY): Payer: Self-pay | Admitting: Internal Medicine

## 2019-04-03 ENCOUNTER — Inpatient Hospital Stay (HOSPITAL_COMMUNITY)
Admission: AD | Admit: 2019-04-03 | Discharge: 2019-04-13 | DRG: 453 | Disposition: A | Discharge status: Home Health | Payer: Medicaid Other | Source: Other Acute Inpatient Hospital | Attending: Family Medicine | Admitting: Family Medicine

## 2019-04-03 DIAGNOSIS — E876 Hypokalemia: Secondary | ICD-10-CM | POA: Diagnosis not present

## 2019-04-03 DIAGNOSIS — Z992 Dependence on renal dialysis: Secondary | ICD-10-CM

## 2019-04-03 DIAGNOSIS — I132 Hypertensive heart and chronic kidney disease with heart failure and with stage 5 chronic kidney disease, or end stage renal disease: Secondary | ICD-10-CM | POA: Diagnosis present

## 2019-04-03 DIAGNOSIS — M4802 Spinal stenosis, cervical region: Secondary | ICD-10-CM | POA: Diagnosis present

## 2019-04-03 DIAGNOSIS — M542 Cervicalgia: Secondary | ICD-10-CM | POA: Diagnosis present

## 2019-04-03 DIAGNOSIS — S129XXA Fracture of neck, unspecified, initial encounter: Secondary | ICD-10-CM | POA: Diagnosis present

## 2019-04-03 DIAGNOSIS — D631 Anemia in chronic kidney disease: Secondary | ICD-10-CM | POA: Diagnosis present

## 2019-04-03 DIAGNOSIS — I272 Pulmonary hypertension, unspecified: Secondary | ICD-10-CM | POA: Diagnosis present

## 2019-04-03 DIAGNOSIS — D72819 Decreased white blood cell count, unspecified: Secondary | ICD-10-CM | POA: Diagnosis present

## 2019-04-03 DIAGNOSIS — M8468XA Pathological fracture in other disease, other site, initial encounter for fracture: Secondary | ICD-10-CM | POA: Diagnosis present

## 2019-04-03 DIAGNOSIS — D696 Thrombocytopenia, unspecified: Secondary | ICD-10-CM | POA: Diagnosis present

## 2019-04-03 DIAGNOSIS — Z419 Encounter for procedure for purposes other than remedying health state, unspecified: Secondary | ICD-10-CM

## 2019-04-03 DIAGNOSIS — N250 Renal osteodystrophy: Secondary | ICD-10-CM | POA: Diagnosis present

## 2019-04-03 DIAGNOSIS — K7030 Alcoholic cirrhosis of liver without ascites: Secondary | ICD-10-CM | POA: Diagnosis present

## 2019-04-03 DIAGNOSIS — Z79899 Other long term (current) drug therapy: Secondary | ICD-10-CM | POA: Diagnosis not present

## 2019-04-03 DIAGNOSIS — N2581 Secondary hyperparathyroidism of renal origin: Secondary | ICD-10-CM | POA: Diagnosis present

## 2019-04-03 DIAGNOSIS — M5412 Radiculopathy, cervical region: Secondary | ICD-10-CM | POA: Diagnosis present

## 2019-04-03 DIAGNOSIS — I10 Essential (primary) hypertension: Secondary | ICD-10-CM | POA: Diagnosis present

## 2019-04-03 DIAGNOSIS — N186 End stage renal disease: Secondary | ICD-10-CM | POA: Diagnosis present

## 2019-04-03 DIAGNOSIS — I350 Nonrheumatic aortic (valve) stenosis: Secondary | ICD-10-CM

## 2019-04-03 DIAGNOSIS — F17210 Nicotine dependence, cigarettes, uncomplicated: Secondary | ICD-10-CM | POA: Diagnosis present

## 2019-04-03 DIAGNOSIS — Z833 Family history of diabetes mellitus: Secondary | ICD-10-CM | POA: Diagnosis not present

## 2019-04-03 DIAGNOSIS — Z20828 Contact with and (suspected) exposure to other viral communicable diseases: Secondary | ICD-10-CM | POA: Diagnosis present

## 2019-04-03 DIAGNOSIS — I5042 Chronic combined systolic (congestive) and diastolic (congestive) heart failure: Secondary | ICD-10-CM | POA: Diagnosis present

## 2019-04-03 DIAGNOSIS — M4804 Spinal stenosis, thoracic region: Secondary | ICD-10-CM | POA: Diagnosis present

## 2019-04-03 DIAGNOSIS — G992 Myelopathy in diseases classified elsewhere: Secondary | ICD-10-CM | POA: Diagnosis present

## 2019-04-03 DIAGNOSIS — I160 Hypertensive urgency: Secondary | ICD-10-CM

## 2019-04-03 HISTORY — DX: Nonrheumatic mitral (valve) insufficiency: I34.0

## 2019-04-03 LAB — COMPREHENSIVE METABOLIC PANEL
ALT: 18 U/L (ref 0–44)
AST: 24 U/L (ref 15–41)
Albumin: 3.2 g/dL — ABNORMAL LOW (ref 3.5–5.0)
Alkaline Phosphatase: 201 U/L — ABNORMAL HIGH (ref 38–126)
Anion gap: 15 (ref 5–15)
BUN: 25 mg/dL — ABNORMAL HIGH (ref 6–20)
CO2: 23 mmol/L (ref 22–32)
Calcium: 8.9 mg/dL (ref 8.9–10.3)
Chloride: 97 mmol/L — ABNORMAL LOW (ref 98–111)
Creatinine, Ser: 6.04 mg/dL — ABNORMAL HIGH (ref 0.61–1.24)
GFR calc Af Amer: 12 mL/min — ABNORMAL LOW (ref >60)
GFR calc non Af Amer: 10 mL/min — ABNORMAL LOW (ref >60)
Glucose, Bld: 144 mg/dL — ABNORMAL HIGH (ref 70–99)
Potassium: 4.7 mmol/L (ref 3.5–5.1)
Sodium: 135 mmol/L (ref 135–145)
Total Bilirubin: 0.9 mg/dL (ref 0.3–1.2)
Total Protein: 6.6 g/dL (ref 6.5–8.1)

## 2019-04-03 LAB — MRSA PCR SCREENING: MRSA by PCR: NEGATIVE

## 2019-04-03 LAB — PROTIME-INR
INR: 1.2 (ref 0.8–1.2)
Prothrombin Time: 15.3 seconds — ABNORMAL HIGH (ref 11.4–15.2)

## 2019-04-03 LAB — GLUCOSE, CAPILLARY: Glucose-Capillary: 157 mg/dL — ABNORMAL HIGH (ref 70–99)

## 2019-04-03 LAB — CBC
HCT: 40 % (ref 39.0–52.0)
Hemoglobin: 13.2 g/dL (ref 13.0–17.0)
MCH: 32.3 pg (ref 26.0–34.0)
MCHC: 33 g/dL (ref 30.0–36.0)
MCV: 97.8 fL (ref 80.0–100.0)
Platelets: 128 10*3/uL — ABNORMAL LOW (ref 150–400)
RBC: 4.09 MIL/uL — ABNORMAL LOW (ref 4.22–5.81)
RDW: 14 % (ref 11.5–15.5)
WBC: 3.1 10*3/uL — ABNORMAL LOW (ref 4.0–10.5)
nRBC: 0 % (ref 0.0–0.2)

## 2019-04-03 LAB — TROPONIN I (HIGH SENSITIVITY): Troponin I (High Sensitivity): 27 ng/L — ABNORMAL HIGH (ref <18)

## 2019-04-03 LAB — ECHOCARDIOGRAM COMPLETE

## 2019-04-03 LAB — APTT: aPTT: 36 seconds (ref 24–36)

## 2019-04-03 MED ORDER — SODIUM CHLORIDE 0.9% FLUSH
3.0000 mL | Freq: Two times a day (BID) | INTRAVENOUS | Status: DC
  Administered 2019-04-03 – 2019-04-13 (×19): 3 mL via INTRAVENOUS

## 2019-04-03 MED ORDER — FAMOTIDINE 20 MG PO TABS
20.0000 mg | ORAL_TABLET | Freq: Every day | ORAL | Status: DC
  Administered 2019-04-03 – 2019-04-13 (×10): 20 mg via ORAL
  Filled 2019-04-03 (×10): qty 1

## 2019-04-03 MED ORDER — ONDANSETRON HCL 4 MG/2ML IJ SOLN: 4.0000 mg | Freq: Four times a day (QID) | INTRAMUSCULAR | Status: DC | PRN

## 2019-04-03 MED ORDER — SODIUM CHLORIDE 0.9 % IV SOLN
250.0000 mL | INTRAVENOUS | Status: DC | PRN
  Administered 2019-04-05 (×2): via INTRAVENOUS

## 2019-04-03 MED ORDER — ACETAMINOPHEN 650 MG RE SUPP: 650.0000 mg | Freq: Four times a day (QID) | RECTAL | Status: DC | PRN

## 2019-04-03 MED ORDER — DEXAMETHASONE SODIUM PHOSPHATE 4 MG/ML IJ SOLN
4.0000 mg | Freq: Four times a day (QID) | INTRAMUSCULAR | Status: DC
  Administered 2019-04-03 – 2019-04-13 (×39): 4 mg via INTRAVENOUS
  Filled 2019-04-03 (×40): qty 1

## 2019-04-03 MED ORDER — SODIUM CHLORIDE 0.9% FLUSH
3.0000 mL | INTRAVENOUS | Status: DC | PRN
  Administered 2019-04-04: 08:00:00 3 mL via INTRAVENOUS
  Filled 2019-04-03: qty 3

## 2019-04-03 MED ORDER — HEPARIN SODIUM (PORCINE) 5000 UNIT/ML IJ SOLN
5000.0000 [IU] | Freq: Three times a day (TID) | INTRAMUSCULAR | Status: DC
  Administered 2019-04-03 – 2019-04-13 (×27): 5000 [IU] via SUBCUTANEOUS
  Filled 2019-04-03 (×28): qty 1

## 2019-04-03 MED ORDER — HYDROMORPHONE HCL 1 MG/ML IJ SOLN
0.5000 mg | INTRAMUSCULAR | Status: DC | PRN
  Administered 2019-04-03 – 2019-04-04 (×3): 0.5 mg via INTRAVENOUS
  Filled 2019-04-03 (×4): qty 1

## 2019-04-03 MED ORDER — ACETAMINOPHEN 325 MG PO TABS
650.0000 mg | ORAL_TABLET | Freq: Four times a day (QID) | ORAL | Status: DC | PRN
  Administered 2019-04-03 – 2019-04-11 (×6): 650 mg via ORAL
  Filled 2019-04-03 (×6): qty 2

## 2019-04-03 NOTE — Progress Notes (Signed)
PROGRESS NOTE   Mario [name]  BWG:[id number]    DOB: 1971/05/25    DOA: 04/03/2019  PCP: [name], MD   I have briefly reviewed patients previous medical [facility] in [facility].  Chief complaint Neck pain with radiation to arms and associated weakness.  Brief Narrative:  48 year old Spanish-speaking male, lives alone, independent, PMH of ESRD on TTS HD via LUE AVF, chronic combined systolic and diastolic CHF, LVEF 17% by TTE in 2017, mild mitral regurgitation, cirrhosis, HTN, initially admitted to [facility] on 04/01/2019 due to progressively worsening neck/upper back pain with radiation to bilateral upper extremity with associated tingling, numbness and upper extremity weakness, left> right.  CT cervical spine showed erosive changes compatible with renal spondyloarthropathy at C3-4, C6-7, C7-T1.  Patient was transferred to [facility] for Neurosurgical consultation.  Better after steroids and cervical collar.  As per neurosurgery, no indication for emergent surgery, getting MRI C-spine without contrast and likely surgery mid next week.   Assessment & Plan:   Principal Problem:   Cervical radiculopathy Active Problems:   ESRD (end stage renal disease) on dialysis (HCC)   HTN (hypertension)   Neck pain   Cervical renal spondyloarthropathy with vertebral body destruction and cervical radiculopathy.  CT cervical spine at OSH showed erosive changes compatible with renal spondyloarthropathy at C3-4, C6-7, C7-T1.  Continue cervical collar and dexamethasone 4 mg IV every 6 hourly.  Neurosurgery consultation appreciated, no indication for emergent surgery, reconstructive options are very difficult, getting MRI C-spine without contrast to rule out infectious etiology and likely surgery mid next week.  Based on TTE, patient may need preop cardiac clearance.  TTE pending.  Pain management.  ESRD on TTS HD  Had HD at OSH on 8/29.  Nephrology consulted for HD needs, next  scheduled HD will be on 9/1.  Secondary hyperparathyroidism  Management per nephrology.  Tobacco abuse  Cessation counseled.  Patient declines nicotine patch.  Chronic combined systolic and diastolic CHF  Last TTE 02/09/3902 showed LVEF 40% with hypokinesis of anteroseptal myocardium and grade 1 diastolic dysfunction.  Mild MR, moderate TR, moderate pulmonary hypertension.  Clinically euvolemic.  Volume management across HD.  Follow repeat TTE and patient will likely need preop cardiology consultation.  Elevated troponin  HS troponin mildly elevated but flat trend, 39 > 38 > 27  No anginal symptoms.  Likely related to ESRD.  Cirrhosis/splenomegaly  Noted on abdominal ultrasound 05/02/2018.  Had prior paracentesis 04/2018.  Quit alcohol intake 3 years ago.  Currently without clinical ascites.  INR 1.2 on 8/30.  Essential hypertension  Mildly uncontrolled.  Similar urgency resolved.  PRN IV hydralazine.  Thrombocytopenia  Appears chronic and stable, possibly related to cirrhosis.  Also has mild leukopenia.  Follow CBCs.   DVT prophylaxis: Subcutaneous heparin Code Status: Full Family Communication: None at bedside Disposition: To be determined pending clinical improvement   Consultants:  Neurosurgery Nephrology  Procedures:  Cervical collar  Antimicrobials:  None   Subjective: Patient was interviewed in detail using video interpreter and patient's RN at bedside.  History as noted above.  Ongoing upper back pain, mostly on the left side rated at 9/10 in severity.  Right-handed.  Reports weakness and dropping things lately.  No history of chest pain, dyspnea, dizziness or lightheadedness even with activity.  Denies history of MI, CAD.  Cannot recollect his nephrologist and does not see cardiology.  Objective:  Vitals:   04/03/19 0423 04/03/19 0754  BP: (!) 187/75 (!) 168/71  Pulse: 71  86  Resp: 13 18  Temp: (!) 97.3 F (36.3 C) 97.7 F (36.5 C)   TempSrc: Axillary   SpO2: 98% 100%    Examination:  General exam: Pleasant young male, moderately built and nourished lying comfortably propped up in bed without distress.  Has cervical collar. Respiratory system: Clear to auscultation. Respiratory effort normal. Cardiovascular system: S1 and S2 heard, RRR.  No JVD, pedal edema.  3/6 pansystolic murmur best heard at apex.  Telemetry personally reviewed: Sinus rhythm. Gastrointestinal system: Abdomen is nondistended, soft and nontender. No organomegaly or masses felt. Normal bowel sounds heard. Central nervous system: Alert and oriented. No focal neurological deficits. Extremities: Symmetric 4+ by 5 power symmetrically in bilateral upper extremities, seems weaker at shoulder level.  Symmetric 5 x 5 power in lower extremities.  Sensory appears intact.  Symmetric 2+ reflexes. Skin: No rashes, lesions or ulcers Psychiatry: Judgement and insight appear normal. Mood & affect appropriate.     Data Reviewed: I have personally reviewed following labs and imaging studies  CBC: Recent Labs  Lab 04/01/19 1024 04/03/19 0652  WBC 5.4 3.1*  HGB 12.5* 13.2  HCT 38.6* 40.0  MCV 98.7 97.8  PLT 146* 270*   Basic Metabolic Panel: Recent Labs  Lab 04/01/19 1024 04/02/19 0405 04/03/19 0652  NA 132*  --  135  K 4.2  --  4.7  CL 92*  --  97*  CO2 25  --  23  GLUCOSE 161*  --  144*  BUN 31*  --  25*  CREATININE 7.36*  --  6.04*  CALCIUM 9.1  --  8.9  PHOS  --  7.8*  --    Liver Function Tests: Recent Labs  Lab 04/03/19 0652  AST 24  ALT 18  ALKPHOS 201*  BILITOT 0.9  PROT 6.6  ALBUMIN 3.2*    Cardiac Enzymes: No results for input(s): CKTOTAL, CKMB, CKMBINDEX, TROPONINI in the last 168 hours.  CBG: No results for input(s): GLUCAP in the last 168 hours.  Recent Results (from the past 240 hour(s))  SARS Coronavirus 2 [facility] order, Performed in [facility] hospital lab) Nasopharyngeal Nasopharyngeal Swab     Status: None    Collection Time: 04/01/19 11:15 AM   Specimen: Nasopharyngeal Swab  Result Value Ref Range Status   SARS Coronavirus 2 NEGATIVE NEGATIVE Final    Comment: (NOTE) If result is NEGATIVE SARS-CoV-2 target nucleic acids are NOT DETECTED. The SARS-CoV-2 RNA is generally detectable in upper and lower  respiratory specimens during the acute phase of infection. The lowest  concentration of SARS-CoV-2 viral copies this assay can detect is 250  copies / mL. A negative result does not preclude SARS-CoV-2 infection  and should not be used as the sole basis for treatment or other  patient management decisions.  A negative result may occur with  improper specimen collection / handling, submission of specimen other  than nasopharyngeal swab, presence of viral mutation(s) within the  areas targeted by this assay, and inadequate number of viral copies  (<250 copies / mL). A negative result must be combined with clinical  observations, patient history, and epidemiological information. If result is POSITIVE SARS-CoV-2 target nucleic acids are DETECTED. The SARS-CoV-2 RNA is generally detectable in upper and lower  respiratory specimens dur ing the acute phase of infection.  Positive  results are indicative of active infection with SARS-CoV-2.  Clinical  correlation with patient history and other diagnostic information is  necessary to determine patient infection status.  Positive results do  not rule out bacterial infection or co-infection with other viruses. If result is PRESUMPTIVE POSTIVE SARS-CoV-2 nucleic acids MAY BE PRESENT.   A presumptive positive result was obtained on the submitted specimen  and confirmed on repeat testing.  While 2019 novel coronavirus  (SARS-CoV-2) nucleic acids may be present in the submitted sample  additional confirmatory testing may be necessary for epidemiological  and / or clinical management purposes  to differentiate between  SARS-CoV-2 and other Sarbecovirus  currently known to infect humans.  If clinically indicated additional testing with an alternate test  methodology [phone]) is advised. The SARS-CoV-2 RNA is generally  detectable in upper and lower respiratory sp ecimens during the acute  phase of infection. The expected result is Negative. Fact Sheet for Patients:  StrictlyIdeas.no Fact Sheet for Healthcare Providers: BankingDealers.co.za This test is not yet approved or cleared by the [place] FDA and has been authorized for detection and/or diagnosis of SARS-CoV-2 by FDA under an Emergency Use Authorization (EUA).  This EUA will remain in effect (meaning this test can be used) for the duration of the COVID-19 declaration under Section 564(b)(1) of the Act, 21 U.S.C. section 360bbb-3(b)(1), unless the authorization is terminated or revoked sooner. Performed at [facility], [address] [place], [place] [address]   MRSA PCR Screening     Status: None   Collection Time: 04/03/19  6:32 AM   Specimen: Nasal Mucosa; Nasopharyngeal  Result Value Ref Range Status   MRSA by PCR NEGATIVE NEGATIVE Final    Comment:        The GeneXpert MRSA Assay (FDA approved for NASAL specimens only), is one component of a comprehensive MRSA colonization surveillance program. It is not intended to diagnose MRSA infection nor to guide or monitor treatment for MRSA infections. Performed at [facility], [place] [address] [place], [place] [address]          Radiology Studies: Ct Cervical Spine Wo Contrast  Result Date: 04/01/2019 CLINICAL DATA:  Neck pain. Bilateral arm numbness. Dialysis patient EXAM: CT CERVICAL SPINE WITHOUT CONTRAST TECHNIQUE: Multidetector CT imaging of the cervical spine was performed without intravenous contrast. Multiplanar CT image reconstructions were also generated. COMPARISON:  CT cervical spine 03/23/2018 FINDINGS: Alignment: Mild retrolisthesis  C3-4. Mild anterolisthesis C4-5. Marked kyphosis centered at C6-7 measuring 31 degrees. Kyphosis has progressed since the prior study due to progressive erosive changes of C6 and C7 vertebral bodies. Skull base and vertebrae: Erosive endplate changes at D3-2, C6-7, C7-T1 most likely due to spondyloarthropathy of dialysis. Progressive changes at C6-7 since the prior CT Soft tissues and spinal canal: Negative Disc levels:  C2-3: Negative C3-4: Well corticated erosive changes in the endplates at I7-1 similar to the prior study. No significant spinal stenosis C4-5: Negative C5-6: Negative C6-7: Erosive changes in the endplates bilaterally has progressed in the interval. There are erosive changes with loss of vertebral body volume of C6 and C7. Only a small amount of vertebral bodies present in the inferior endplate of C7. There is marked kyphosis at this level. Posterior spurring is present with mild spinal stenosis. Left facet subluxation with either degenerative or erosive changes in the facet. Possible instability. C7-T1: Erosive endplate changes similar to the prior study. 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 Marked progression of bony erosion at C6-7 with 31 degrees of kyphosis which has progressed in  the interval. Subluxation of the left C6-7 facet which may be degenerative or due to erosive arthropathy. Surgical consultation is suggested. These results were called by telephone at the time of interpretation on 04/01/2019 at 3:13 pm to Dr. Quentin Cornwall , who verbally acknowledged these results. Electronically Signed   By: Franchot Gallo M.D.   On: 04/01/2019 15:14        Scheduled Meds: . dexamethasone (DECADRON) injection  4 mg Intravenous Q6H  . heparin  5,000 Units Subcutaneous Q8H  . sodium chloride flush  3 mL Intravenous Q12H   Continuous Infusions: .  sodium chloride       LOS: 0 days     Vernell Leep, MD, FACP, [facility]. [facility]  To contact the attending provider between 7A-7P or the covering provider during after hours 7P-7A, please log into the web site [email or web address] and access using universal [place] password for that web site. If you do not have the password, please call the hospital operator.  04/03/2019, 12:28 PM

## 2019-04-03 NOTE — Consult Note (Signed)
[facility] Renal Consultation Note    Indication for Consultation:  Management of ESRD/hemodialysis; anemia, hypertension/volume and secondary hyperparathyroidism  [id number], [place], MD  HPI: [facility] Mario [name] is a 48 y.o. male. ESRD on HD TTS at [facility] on [facility] in [place], [place].  Per chart review he is followed by [facility].  Past medical history significant for HTN, alcoholic cirrhosis, CHF (KD32%) and mild MR.    Patient seen and examined at bedside.  History gathered by chart review and patient through use of an interpreter.  Presented to the ED at [facility] due to pain and pressure in his shoulders and neck with corresponding arm numbness and weakness.  CT showed erosive endplate changes consistent with renal spondyloarthropathy w/vertebral body destruction.  Neurosurgery consult recommended so patient transferred to [facility] today.  Patient reports neck and bilateral shoulder pain progressively worsening over the last several weeks.  States his arms have become weaker and often has numbness/tingling which is worse while on dialysis. He has been admitted for additional evaluation and management of cervical radiculopathy.   [name] completed dialysis yesterday while at [facility].  He reports no other problems while on dialysis.  Will contact outpatient dialysis center tomorrow for regular dialysis orders.   Past Medical History:  Diagnosis Date  . Abdominal pain 08/28/2016  . Acute hyperkalemia 10/28/2016  . Chest pain 04/11/2016  . Chronic combined systolic and diastolic CHF (congestive heart failure) ([place])   . Cirrhosis ([place])   . Dialysis patient ([place])   . ESRD (end stage renal disease) on dialysis ([place]) 01/25/2015  . Fluid overload 04/12/2016  . HTN (hypertension) 10/13/2016  . Hyperkalemia 11/27/2016  . Hypertension   . Mitral regurgitation   . Pulmonary edema 07/03/2016  . Renal disorder   . Renal insufficiency   . Scrotal infection 01/25/2015   Past  Surgical History:  Procedure Laterality Date  . AV FISTULA PLACEMENT     Family History  Problem Relation Age of Onset  . Kidney failure Father   . Diabetes Mother    Social History:  reports that he has been smoking cigarettes. He has a 6.25 pack-year smoking history. He has never used smokeless tobacco. He reports that he does not drink alcohol or use drugs. Allergies  Allergen Reactions  . Betadine [Povidone Iodine] Rash   Prior to Admission medications   Medication Sig Start Date End Date Taking? Authorizing Provider  acetaminophen (TYLENOL) 500 MG tablet Take 500 mg by mouth every 6 (six) hours as needed for mild pain.   Yes [provider]  ferric citrate (AURYXIA) 1 GM 210 MG(Fe) tablet Take 420 mg by mouth 3 (three) times daily with meals.    Yes [provider]   Current Facility-Administered Medications  Medication Dose Route Frequency Provider Last Rate Last Dose  . 0.9 %  sodium chloride infusion  250 mL Intravenous PRN Jani Gravel, MD      . acetaminophen (TYLENOL) tablet 650 mg  650 mg Oral Q6H PRN Jani Gravel, MD   650 mg at 04/03/19 0908   Or  . acetaminophen (TYLENOL) suppository 650 mg  650 mg Rectal Q6H PRN Jani Gravel, MD      . dexamethasone (DECADRON) injection 4 mg  4 mg Intravenous Q6H Jani Gravel, MD   4 mg at 04/03/19 6712  . heparin injection 5,000 Units  5,000 Units Subcutaneous Q8H Jani Gravel, MD      . HYDROmorphone (DILAUDID) injection 0.5 mg  0.5  mg Intravenous Q4H PRN Jani Gravel, MD   0.5 mg at 04/03/19 0915  . ondansetron (ZOFRAN) injection 4 mg  4 mg Intravenous Q6H PRN Jani Gravel, MD      . sodium chloride flush (NS) 0.9 % injection 3 mL  3 mL Intravenous Q12H Jani Gravel, MD   3 mL at 04/03/19 0954  . sodium chloride flush (NS) 0.9 % injection 3 mL  3 mL Intravenous PRN Jani Gravel, MD       Labs: Basic Metabolic Panel: Recent Labs  Lab 04/01/19 1024 04/02/19 0405 04/03/19 0652  NA 132*  --  135  K 4.2  --  4.7  CL 92*  --   97*  CO2 25  --  23  GLUCOSE 161*  --  144*  BUN 31*  --  25*  CREATININE 7.36*  --  6.04*  CALCIUM 9.1  --  8.9  PHOS  --  7.8*  --    Liver Function Tests: Recent Labs  Lab 04/03/19 0652  AST 24  ALT 18  ALKPHOS 201*  BILITOT 0.9  PROT 6.6  ALBUMIN 3.2*   CBC: Recent Labs  Lab 04/01/19 1024 04/03/19 0652  WBC 5.4 3.1*  HGB 12.5* 13.2  HCT 38.6* 40.0  MCV 98.7 97.8  PLT 146* 128*    Studies/Results: Ct Cervical Spine Wo Contrast  Result Date: 04/01/2019 CLINICAL DATA:  Neck pain. Bilateral arm numbness. Dialysis patient EXAM: CT CERVICAL SPINE WITHOUT CONTRAST TECHNIQUE: Multidetector CT imaging of the cervical spine was performed without intravenous contrast. Multiplanar CT image reconstructions were also generated. COMPARISON:  CT cervical spine 03/23/2018 FINDINGS: Alignment: Mild retrolisthesis C3-4. Mild anterolisthesis C4-5. Marked kyphosis centered at C6-7 measuring 31 degrees. Kyphosis has progressed since the prior study due to progressive erosive changes of C6 and C7 vertebral bodies. Skull base and vertebrae: Erosive endplate changes at C3-7, C6-7, C7-T1 most likely due to spondyloarthropathy of dialysis. Progressive changes at C6-7 since the prior CT Soft tissues and spinal canal: Negative Disc levels:  C2-3: Negative C3-4: Well corticated erosive changes in the endplates at S2-8 similar to the prior study. No significant spinal stenosis C4-5: Negative C5-6: Negative C6-7: Erosive changes in the endplates bilaterally has progressed in the interval. There are erosive changes with loss of vertebral body volume of C6 and C7. Only a small amount of vertebral bodies present in the inferior endplate of C7. There is marked kyphosis at this level. Posterior spurring is present with mild spinal stenosis. Left facet subluxation with either degenerative or erosive changes in the facet. Possible instability. C7-T1: Erosive endplate changes similar to the prior study. 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 Marked progression of bony erosion at C6-7 with 31 degrees of kyphosis which has progressed in the interval. Subluxation of the left C6-7 facet which may be degenerative or due to erosive arthropathy. Surgical consultation is suggested. These results were called by telephone at the time of interpretation on 04/01/2019 at 3:13 pm to Dr. Quentin Cornwall , who verbally acknowledged these results. Electronically Signed   By: Franchot Gallo M.D.   On: 04/01/2019 15:14    ROS: All others negative except those listed in HPI.  Physical Exam: Vitals:   04/03/19 0423 04/03/19 0754  BP: (!) 187/75 (!) 168/71  Pulse: 71 86  Resp: 13 18  Temp: (!) 97.3 F (36.3 C) 97.7 F (36.5  C)  TempSrc: Axillary   SpO2: 98% 100%     General: WDWN, NAD, well appearing male, sitting up in bed Head: NCAT sclera not icteric MMM Neck: in C collar Lungs: CTA bilaterally. No wheeze, rales or rhonchi. Breathing is unlabored. Heart: RRR. No murmur, rubs or gallops.  Abdomen: soft, nontender, +BS, no guarding, no rebound tenderness  Lower extremities:no edema, ischemic changes, or open wounds, scabbing under left knee Neuro: AAOx3. Moves all extremities spontaneously. Psych:  Responds to questions appropriately with a normal affect. Dialysis Access: LU AVF with large aneurysm  Dialysis Orders:  [facility] with HD TTS at [facility] on [facility] Will contact OP center for orders tomorrow  Assessment/Plan: 1.  Neck pain/cervical radiculopathy - Neurosurgery consulting, possible renal spondyloarthropathy. MRI ordered to better assess. Non emergent. Likely to have extensive surgery mid week. Per NS 2.  ESRD -  On HD TTS.  HD yesterday.  Next HD 9/1 per regular schedule 3.  Hypertension/volume  - BP elevated.  CXR showed mild pulmonary edema pre HD.   Does not appear volume overloaded today, CTAB/no edema. Net UF 1353mL removed yesterday. 4.  Anemia of CKD - Hgb 13.2. No indication for ESA. 5.  Secondary Hyperparathyroidism -  Ca at goal. Phos elevated. Continue binders. 6.  Nutrition - Renal diet w/fluid restrictions 7. Hx CHF - Echo ordered 8. Alcoholic Cirrhosis  Jen Mow, PA-C [facility] Pager: [phone] 04/03/2019, 12:44 PM

## 2019-04-03 NOTE — Progress Notes (Signed)
Spoke with Mitzi and gave report.  [facility] has been contacted.

## 2019-04-03 NOTE — Progress Notes (Signed)
Orthopedic Tech Progress Note Patient Details:  Mario [name] Aug 19, 1970 [id number] RN said patient has collar Patient ID: Mario [name], male   DOB: [birth year], 48 y.o.   MRN: [id number]   Mario [name] 04/03/2019, 5:13 PM

## 2019-04-03 NOTE — H&P (Signed)
TRH H&P    Patient Demographics:    Mario [name], is a 48 y.o. male  MRN: [id number]  DOB - Oct 06, 1970  Admit Date - 04/03/2019  Referring MD/NP/PA:  Max Sane  Outpatient Primary MD for the patient is [name], MD  Patient coming from: [facility]  Chief complaint-  Neck pain, cervical radiculopathy   HPI:    Mario [name]  is a 48 y.o. male, w hypertension, CHF (EF 40%), mild MR, ESRD on HD TTS, , Cirrhosis (quit drinking 3 years ago), apparently c/o neck/ shoulder/ arm pain x 4 weeks.  Pt denies fall or trauma. Unable to determine from patient if there is numbness.  Pt denies weakness, just seems to complain of pain of the left shoulder and neck with radiation to the bilateral arms. 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  Marked progression of bony erosion at C6-7 with 31 degrees of kyphosis which has progressed in the interval. Subluxation of the left C6-7 facet which may be degenerative or due to erosive arthropathy.  Surgical consultation is suggested.  Apparently [facility] declined to accept the patient and the patient accepted to [facility] per neurosurgery. Francie Massing)  Pt will be admitted for cervical radiculopathy ?    Review of systems:    In addition to the HPI above,  No Fever-chills, No Headache, No changes with Vision or hearing, No problems swallowing food or Liquids, No Chest pain, Cough or Shortness of Breath, No Abdominal pain, No Nausea or Vomiting, bowel movements are regular, No Blood in stool or Urine, No dysuria, No new skin rashes or bruises,   No new weakness, tingling, numbness in any  extremity, No recent weight gain or loss, No polyuria, polydypsia or polyphagia, No significant Mental Stressors.  All other systems reviewed and are negative.    Past History of the following :    Past Medical History:  Diagnosis Date   Abdominal pain 08/28/2016   Acute hyperkalemia 10/28/2016   Chest pain 04/11/2016   Chronic combined systolic and diastolic CHF (congestive heart failure) ([place])    Cirrhosis ([place])    Dialysis patient ([place])    ESRD (end stage renal disease) on dialysis ([place]) 01/25/2015   Fluid overload 04/12/2016   HTN (hypertension) 10/13/2016   Hyperkalemia 11/27/2016   Hypertension    Mitral regurgitation    Pulmonary edema 07/03/2016   Renal disorder    Renal insufficiency    Scrotal infection 01/25/2015      Past Surgical History:  Procedure Laterality Date   AV FISTULA PLACEMENT        Social History:      Social History   Tobacco Use   Smoking status: Current Every Day Smoker    Packs/day: 0.25    Years: 25.00  Pack years: 6.25    Types: Cigarettes   Smokeless tobacco: Never Used  Substance Use Topics   Alcohol use: No    Comment: pt denies       Family History :     Family History  Problem Relation Age of Onset   Kidney failure Father    Diabetes Mother        Home Medications:   Prior to Admission medications   Medication Sig Start Date End Date Taking? Authorizing Provider  ferric citrate (AURYXIA) 1 GM 210 MG(Fe) tablet Take 420 mg by mouth 3 (three) times daily with meals.    [provider]  traMADol (ULTRAM) 50 MG tablet Take 50 mg by mouth every 12 (twelve) hours as needed.    [provider]     Allergies:     Allergies  Allergen Reactions   Betadine [Povidone Iodine] Rash     Physical Exam:   Vitals  Blood pressure (!) 187/75, pulse 71, temperature (!) 97.3 F (36.3 C), temperature source Axillary, resp. rate 13, SpO2 98 %.  1.  General: axoxo3  2.  Psychiatric: euthymic  3. Neurologic: cn2- 12 intact, reflexes 2+ symmetric, diffuse with upgoing toe on the left, and motor 5/5 in all 4 ext  4. HEENMT:  Anicteric, pupils 1.66mm symmetric, direct, consensual, near intact Neck: no jvd  5. Respiratory : CTAB  6. Cardiovascular : rrr s1, s2, 2/6 sem rusb, llsb , apex  7. Gastrointestinal:  Abd: soft, nt, nd, +bs  8. Skin:  Ext: no c/c/e,  Pt has scab over the left elbow, ? Remote fall  9.Musculoskeletal:  Good ROM    Data Review:    CBC Recent Labs  Lab 04/01/19 1024  WBC 5.4  HGB 12.5*  HCT 38.6*  PLT 146*  MCV 98.7  MCH 32.0  MCHC 32.4  RDW 13.9   ------------------------------------------------------------------------------------------------------------------  Results for orders placed or performed during the hospital encounter of 04/01/19 (from the past 48 hour(s))  Basic metabolic panel     Status: Abnormal   Collection Time: 04/01/19 10:24 AM  Result Value Ref Range   Sodium 132 (L) 135 - 145 mmol/L   Potassium 4.2 3.5 - 5.1 mmol/L   Chloride 92 (L) 98 - 111 mmol/L   CO2 25 22 - 32 mmol/L   Glucose, Bld 161 (H) 70 - 99 mg/dL   BUN 31 (H) 6 - 20 mg/dL   Creatinine, Ser 7.36 (H) 0.61 - 1.24 mg/dL   Calcium 9.1 8.9 - 10.3 mg/dL   GFR calc non Af Amer 8 (L) >60 mL/min   GFR calc Af Amer 9 (L) >60 mL/min   Anion gap 15 5 - 15    Comment: Performed at [facility], [address] [place], [place] [address]  CBC     Status: Abnormal   Collection Time: 04/01/19 10:24 AM  Result Value Ref Range   WBC 5.4 4.0 - 10.5 K/uL   RBC 3.91 (L) 4.22 - 5.81 MIL/uL   Hemoglobin 12.5 (L) 13.0 - 17.0 g/dL   HCT 38.6 (L) 39.0 - 52.0 %   MCV 98.7 80.0 - 100.0 fL   MCH 32.0 26.0 - 34.0 pg   MCHC 32.4 30.0 - 36.0 g/dL   RDW 13.9 11.5 - 15.5 %   Platelets 146 (L) 150 - 400 K/uL   nRBC 0.0 0.0 - 0.2 %    Comment: Performed at [facility], [address], [place], [place]  [address]  Troponin I  (High Sensitivity)     Status: Abnormal   Collection Time: 04/01/19 10:24 AM  Result Value Ref Range   Troponin I (High Sensitivity) 39 (H) <18 ng/L    Comment: (NOTE) Elevated high sensitivity troponin I (hsTnI) values and significant  changes across serial measurements may suggest ACS but many other  chronic and acute conditions are known to elevate hsTnI results.  Refer to the "Links" section for chest pain algorithms and additional  guidance. Performed at [facility], [address] [place], [place] [address]   SARS Coronavirus 2 [facility] order, Performed in [facility] hospital lab) Nasopharyngeal Nasopharyngeal Swab     Status: None   Collection Time: 04/01/19 11:15 AM   Specimen: Nasopharyngeal Swab  Result Value Ref Range   SARS Coronavirus 2 NEGATIVE NEGATIVE    Comment: (NOTE) If result is NEGATIVE SARS-CoV-2 target nucleic acids are NOT DETECTED. The SARS-CoV-2 RNA is generally detectable in upper and lower  respiratory specimens during the acute phase of infection. The lowest  concentration of SARS-CoV-2 viral copies this assay can detect is 250  copies / mL. A negative result does not preclude SARS-CoV-2 infection  and should not be used as the sole basis for treatment or other  patient management decisions.  A negative result may occur with  improper specimen collection / handling, submission of specimen other  than nasopharyngeal swab, presence of viral mutation(s) within the  areas targeted by this assay, and inadequate number of viral copies  (<250 copies / mL). A negative result must be combined with clinical  observations, patient history, and epidemiological information. If result is POSITIVE SARS-CoV-2 target nucleic acids are DETECTED. The SARS-CoV-2 RNA is generally detectable in upper and lower  respiratory specimens dur ing the acute phase of infection.  Positive  results are indicative of active infection with SARS-CoV-2.  Clinical   correlation with patient history and other diagnostic information is  necessary to determine patient infection status.  Positive results do  not rule out bacterial infection or co-infection with other viruses. If result is PRESUMPTIVE POSTIVE SARS-CoV-2 nucleic acids MAY BE PRESENT.   A presumptive positive result was obtained on the submitted specimen  and confirmed on repeat testing.  While 2019 novel coronavirus  (SARS-CoV-2) nucleic acids may be present in the submitted sample  additional confirmatory testing may be necessary for epidemiological  and / or clinical management purposes  to differentiate between  SARS-CoV-2 and other Sarbecovirus currently known to infect humans.  If clinically indicated additional testing with an alternate test  methodology [phone]) is advised. The SARS-CoV-2 RNA is generally  detectable in upper and lower respiratory sp ecimens during the acute  phase of infection. The expected result is Negative. Fact Sheet for Patients:  StrictlyIdeas.no Fact Sheet for Healthcare Providers: BankingDealers.co.za This test is not yet approved or cleared by the [place] FDA and has been authorized for detection and/or diagnosis of SARS-CoV-2 by FDA under an Emergency Use Authorization (EUA).  This EUA will remain in effect (meaning this test can be used) for the duration of the COVID-19 declaration under Section 564(b)(1) of the Act, 21 U.S.C. section 360bbb-3(b)(1), unless the authorization is terminated or revoked sooner. Performed at [facility], [address] [place], [place] [address]   Troponin I (High Sensitivity)     Status: Abnormal   Collection Time: 04/01/19  2:01 PM  Result Value Ref Range   Troponin I (High Sensitivity) 38 (H) <18  ng/L    Comment: (NOTE) Elevated high sensitivity troponin I (hsTnI) values and significant  changes across serial measurements may suggest ACS but many  other  chronic and acute conditions are known to elevate hsTnI results.  Refer to the "Links" section for chest pain algorithms and additional  guidance. Performed at [facility], [address] [place], [place] [address]   Phosphorus     Status: Abnormal   Collection Time: 04/02/19  4:05 AM  Result Value Ref Range   Phosphorus 7.8 (H) 2.5 - 4.6 mg/dL    Comment: Performed at [facility], [address] [place], [place] [address]    Chemistries  Recent Labs  Lab 04/01/19 1024  NA 132*  K 4.2  CL 92*  CO2 25  GLUCOSE 161*  BUN 31*  CREATININE 7.36*  CALCIUM 9.1   ------------------------------------------------------------------------------------------------------------------  ------------------------------------------------------------------------------------------------------------------ GFR: Estimated Creatinine Clearance: 10.7 mL/min (A) (by C-G formula based on SCr of 7.36 mg/dL (H)). Liver Function Tests: No results for input(s): AST, ALT, ALKPHOS, BILITOT, PROT, ALBUMIN in the last 168 hours. No results for input(s): LIPASE, AMYLASE in the last 168 hours. No results for input(s): AMMONIA in the last 168 hours. Coagulation Profile: No results for input(s): INR, PROTIME in the last 168 hours. Cardiac Enzymes: No results for input(s): CKTOTAL, CKMB, CKMBINDEX, TROPONINI in the last 168 hours. BNP (last 3 results) No results for input(s): PROBNP in the last 8760 hours. HbA1C: No results for input(s): HGBA1C in the last 72 hours. CBG: No results for input(s): GLUCAP in the last 168 hours. Lipid Profile: No results for input(s): CHOL, HDL, LDLCALC, TRIG, CHOLHDL, LDLDIRECT in the last 72 hours. Thyroid Function Tests: No results for input(s): TSH, T4TOTAL, FREET4, T3FREE, THYROIDAB in the last 72 hours. Anemia Panel: No results for input(s): VITAMINB12, FOLATE, FERRITIN, TIBC, IRON, RETICCTPCT in the last 72  hours.  --------------------------------------------------------------------------------------------------------------- Urine analysis: No results found for: COLORURINE, APPEARANCEUR, LABSPEC, PHURINE, GLUCOSEU, HGBUR, BILIRUBINUR, KETONESUR, PROTEINUR, UROBILINOGEN, NITRITE, LEUKOCYTESUR    Imaging Results:    Ct Cervical Spine Wo Contrast  Result Date: 04/01/2019 CLINICAL DATA:  Neck pain. Bilateral arm numbness. Dialysis patient EXAM: CT CERVICAL SPINE WITHOUT CONTRAST TECHNIQUE: Multidetector CT imaging of the cervical spine was performed without intravenous contrast. Multiplanar CT image reconstructions were also generated. COMPARISON:  CT cervical spine 03/23/2018 FINDINGS: Alignment: Mild retrolisthesis C3-4. Mild anterolisthesis C4-5. Marked kyphosis centered at C6-7 measuring 31 degrees. Kyphosis has progressed since the prior study due to progressive erosive changes of C6 and C7 vertebral bodies. Skull base and vertebrae: Erosive endplate changes at A8-3, C6-7, C7-T1 most likely due to spondyloarthropathy of dialysis. Progressive changes at C6-7 since the prior CT Soft tissues and spinal canal: Negative Disc levels:  C2-3: Negative C3-4: Well corticated erosive changes in the endplates at M1-9 similar to the prior study. No significant spinal stenosis C4-5: Negative C5-6: Negative C6-7: Erosive changes in the endplates bilaterally has progressed in the interval. There are erosive changes with loss of vertebral body volume of C6 and C7. Only a small amount of vertebral bodies present in the inferior endplate of C7. There is marked kyphosis at this level. Posterior spurring is present with mild spinal stenosis. Left facet subluxation with either degenerative or erosive changes in the facet. Possible instability. C7-T1: Erosive endplate changes similar to the prior study. 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  Marked progression of bony erosion at C6-7 with 31 degrees of kyphosis which has progressed in the interval. Subluxation of the left C6-7 facet which may be degenerative or due to erosive arthropathy. Surgical consultation is suggested. These results were called by telephone at the time of interpretation on 04/01/2019 at 3:13 pm to Dr. Quentin Cornwall , who verbally acknowledged these results. Electronically Signed   By: Franchot Gallo M.D.   On: 04/01/2019 15:14   Dg Chest Portable 1 View  Result Date: 04/01/2019 CLINICAL DATA:  Pain between the shoulder blades and intermittent bilateral arm numbness for 2 weeks. EXAM: PORTABLE CHEST 1 VIEW COMPARISON:  Single-view of the chest 05/01/2018. FINDINGS: There is cardiomegaly and mild interstitial edema. No consolidative process, pneumothorax or effusion. Atherosclerosis noted. No acute or focal bony abnormality. IMPRESSION: Cardiomegaly mild interstitial edema. Atherosclerosis. Electronically Signed   By: Inge Rise M.D.   On: 04/01/2019 11:15       Assessment & Plan:    Principal Problem:   Cervical radiculopathy Active Problems:   ESRD (end stage renal disease) on dialysis ([facility])   HTN (hypertension)   Neck pain  Cervical radiculopathy Decadron 4mg  iv q6h  Dilaudid 0.5mg  iv q4h prn Neurosurgery consultation requested  Hypertension Unclear what medication pt is taking Hydralazine 10mg  iv q6h prn sbp >160  ESRD on HD TTS Please consult nephrology in AM to follow for dialysis while here  Cardiac murmer, last echo 2017 Check cardiac echo  Chronic systolic CHF Strict I/O Check daily weight  H/o elevated troponin No chest pain Check trop i    DVT Prophylaxis-   heparin- SCDs   AM Labs Ordered, also please review Full Orders  Family Communication: Admission, patients condition and plan of care including tests being ordered have been discussed with the patient  who indicate  understanding and agree with the plan and Code Status.  Code Status:  FULL CODE  Admission status: Inpatient: Based on patients clinical presentation and evaluation of above clinical data, I have made determination that patient meets Inpatient criteria at this time.  Pt has cervical radiculopathy apparently causing weakness of the arms,  Pt will require neurosurgery evaluation and possibly intervention,  Pt has high risk of clinical decline, has been on iv decadron.   Pt will require > 2 nites stay.   Time spent in minutes : 55   Jani Gravel M.D on 04/03/2019 at 5:33 AM

## 2019-04-03 NOTE — Progress Notes (Signed)
Message sent to Dr if we are going to renew cardiac monitoring.

## 2019-04-03 NOTE — Progress Notes (Signed)
*  PRELIMINARY RESULTS* Echocardiogram 2D Echocardiogram has been performed.  Leavy Cella 04/03/2019, 12:10 PM

## 2019-04-03 NOTE — Progress Notes (Signed)
Message sent to [facility] for orders with patient.

## 2019-04-03 NOTE — Consult Note (Signed)
Reason for Consult: Neck pain and weakness Referring Physician: Medicine  [facility] Mario [name] is an 48 y.o. male.  HPI: 48 year old male with multiple medical problems including end-stage renal disease on chronic hemodialysis.  Patient with progressive neck pain with bilateral upper extremity numbness tingling and some weakness.  Symptoms ongoing for the past few weeks.  Symptoms have improved with oral steroids and cervical immobilization.  Currently minimal pain.  Past Medical History:  Diagnosis Date  . Abdominal pain 08/28/2016  . Acute hyperkalemia 10/28/2016  . Chest pain 04/11/2016  . Chronic combined systolic and diastolic CHF (congestive heart failure) ([place])   . Cirrhosis ([place])   . Dialysis patient ([place])   . ESRD (end stage renal disease) on dialysis ([place]) 01/25/2015  . Fluid overload 04/12/2016  . HTN (hypertension) 10/13/2016  . Hyperkalemia 11/27/2016  . Hypertension   . Mitral regurgitation   . Pulmonary edema 07/03/2016  . Renal disorder   . Renal insufficiency   . Scrotal infection 01/25/2015    Past Surgical History:  Procedure Laterality Date  . AV FISTULA PLACEMENT      Family History  Problem Relation Age of Onset  . Kidney failure Father   . Diabetes Mother     Social History:  reports that he has been smoking cigarettes. He has a 6.25 pack-year smoking history. He has never used smokeless tobacco. He reports that he does not drink alcohol or use drugs.  Allergies:  Allergies  Allergen Reactions  . Betadine [Povidone Iodine] Rash    Medications: I have reviewed the patient's current medications.  Results for orders placed or performed during the hospital encounter of 04/03/19 (from the past 48 hour(s))  MRSA PCR Screening     Status: None   Collection Time: 04/03/19  6:32 AM   Specimen: Nasal Mucosa; Nasopharyngeal  Result Value Ref Range   MRSA by PCR NEGATIVE NEGATIVE    Comment:        The GeneXpert MRSA Assay (FDA approved for NASAL  specimens only), is one component of a comprehensive MRSA colonization surveillance program. It is not intended to diagnose MRSA infection nor to guide or monitor treatment for MRSA infections. Performed at [facility], [place] [address], [place], [place] [address]   Comprehensive metabolic panel     Status: Abnormal   Collection Time: 04/03/19  6:52 AM  Result Value Ref Range   Sodium 135 135 - 145 mmol/L   Potassium 4.7 3.5 - 5.1 mmol/L   Chloride 97 (L) 98 - 111 mmol/L   CO2 23 22 - 32 mmol/L   Glucose, Bld 144 (H) 70 - 99 mg/dL   BUN 25 (H) 6 - 20 mg/dL   Creatinine, Ser 6.04 (H) 0.61 - 1.24 mg/dL   Calcium 8.9 8.9 - 10.3 mg/dL   Total Protein 6.6 6.5 - 8.1 g/dL   Albumin 3.2 (L) 3.5 - 5.0 g/dL   AST 24 15 - 41 U/L   ALT 18 0 - 44 U/L   Alkaline Phosphatase 201 (H) 38 - 126 U/L   Total Bilirubin 0.9 0.3 - 1.2 mg/dL   GFR calc non Af Amer 10 (L) >60 mL/min   GFR calc Af Amer 12 (L) >60 mL/min   Anion gap 15 5 - 15    Comment: Performed at [facility], [place] [address], [place], [place] [address]  CBC     Status: Abnormal   Collection Time: 04/03/19  6:52 AM  Result Value  Ref Range   WBC 3.1 (L) 4.0 - 10.5 K/uL   RBC 4.09 (L) 4.22 - 5.81 MIL/uL   Hemoglobin 13.2 13.0 - 17.0 g/dL   HCT 40.0 39.0 - 52.0 %   MCV 97.8 80.0 - 100.0 fL   MCH 32.3 26.0 - 34.0 pg   MCHC 33.0 30.0 - 36.0 g/dL   RDW 14.0 11.5 - 15.5 %   Platelets 128 (L) 150 - 400 K/uL   nRBC 0.0 0.0 - 0.2 %    Comment: Performed at [facility], [place] [address] [place], [place] [address]  Protime-INR     Status: Abnormal   Collection Time: 04/03/19  6:52 AM  Result Value Ref Range   Prothrombin Time 15.3 (H) 11.4 - 15.2 seconds   INR 1.2 0.8 - 1.2    Comment: (NOTE) INR goal varies based on device and disease states. Performed at [facility], [place] [address] [place], [place] [address]   APTT     Status: None   Collection Time: 04/03/19  6:52 AM  Result Value Ref Range   aPTT 36  24 - 36 seconds    Comment: Performed at [place] [address], [place], [place] [address]  Troponin I (High Sensitivity)     Status: Abnormal   Collection Time: 04/03/19  6:52 AM  Result Value Ref Range   Troponin I (High Sensitivity) 27 (H) <18 ng/L    Comment: (NOTE) Elevated high sensitivity troponin I (hsTnI) values and significant  changes across serial measurements may suggest ACS but many other  chronic and acute conditions are known to elevate hsTnI results.  Refer to the "Links" section for chest pain algorithms and additional  guidance. Performed at [facility], [place] [address] [place], [place] [address]     Ct Cervical Spine Wo Contrast  Result Date: 04/01/2019 CLINICAL DATA:  Neck pain. Bilateral arm numbness. Dialysis patient EXAM: CT CERVICAL SPINE WITHOUT CONTRAST TECHNIQUE: Multidetector CT imaging of the cervical spine was performed without intravenous contrast. Multiplanar CT image reconstructions were also generated. COMPARISON:  CT cervical spine 03/23/2018 FINDINGS: Alignment: Mild retrolisthesis C3-4. Mild anterolisthesis C4-5. Marked kyphosis centered at C6-7 measuring 31 degrees. Kyphosis has progressed since the prior study due to progressive erosive changes of C6 and C7 vertebral bodies. Skull base and vertebrae: Erosive endplate changes at G6-6, C6-7, C7-T1 most likely due to spondyloarthropathy of dialysis. Progressive changes at C6-7 since the prior CT Soft tissues and spinal canal: Negative Disc levels:  C2-3: Negative C3-4: Well corticated erosive changes in the endplates at Z9-9 similar to the prior study. No significant spinal stenosis C4-5: Negative C5-6: Negative C6-7: Erosive changes in the endplates bilaterally has progressed in the interval. There are erosive changes with loss of vertebral body volume of C6 and C7. Only a small amount of vertebral bodies present in the inferior endplate of C7. There is marked kyphosis at this level.  Posterior spurring is present with mild spinal stenosis. Left facet subluxation with either degenerative or erosive changes in the facet. Possible instability. C7-T1: Erosive endplate changes similar to the prior study. 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 Marked progression of bony erosion at C6-7 with 31 degrees of kyphosis which has progressed in the interval. Subluxation of the left C6-7 facet which may be degenerative or due to erosive arthropathy. Surgical consultation  is suggested. These results were called by telephone at the time of interpretation on 04/01/2019 at 3:13 pm to Dr. Quentin Cornwall , who verbally acknowledged these results. Electronically Signed   By: Franchot Gallo M.D.   On: 04/01/2019 15:14    Review of systems not obtained due to patient factors. Blood pressure (!) 168/71, pulse 86, temperature 97.7 F (36.5 C), resp. rate 18, SpO2 100 %. Patient is awake and alert.  He is oriented and appropriate.  Speech is fluent.  Cranial nerve function normal bilateral.  Motor examination with 5/5 strength bilateral upper and lower extremities.  Sensory examination limited secondary to language barrier but appears grossly intact.  Reflexes somewhat increased in both lower extremities.  No long track signs.  Gait not assessed.  Cervical spine with mildly tender posterior cervical region to palpation.  No gross bony abnormality.  Chest and abdomen benign.  Assessment/Plan: Patient with severe cervical spine destructive change primarily at C6-C7 with severe kyphotic angulation.  Patient also with endplate erosive changes at C3-4 and also to some degree into the inferior aspect of the vertebral body of C5 and to T1.  Spinal canal cannot be fully assessed.  Overall imaging of noncontrasted CT scan mostly consistent with renal spondyloarthropathy with  vertebral body destruction.  Reconstructive options are very difficult secondary to poor bone quality and severe multilevel disease.  Patient needs MRI scan with and without contrast to assess for possible infectious etiology.  No indication for emergent surgery today or tomorrow.  Patient situation will need extensive anterior and posterior cervical surgery for stabilization.  I will continue to follow.  Likely surgery midweek.  Mario [name] 04/03/2019, 12:07 PM

## 2019-04-04 ENCOUNTER — Other Ambulatory Visit: Payer: Self-pay | Admitting: Neurosurgery

## 2019-04-04 DIAGNOSIS — Z01810 Encounter for preprocedural cardiovascular examination: Secondary | ICD-10-CM

## 2019-04-04 DIAGNOSIS — F17200 Nicotine dependence, unspecified, uncomplicated: Secondary | ICD-10-CM

## 2019-04-04 DIAGNOSIS — R7989 Other specified abnormal findings of blood chemistry: Secondary | ICD-10-CM

## 2019-04-04 DIAGNOSIS — M5412 Radiculopathy, cervical region: Secondary | ICD-10-CM

## 2019-04-04 DIAGNOSIS — I10 Essential (primary) hypertension: Secondary | ICD-10-CM

## 2019-04-04 DIAGNOSIS — N186 End stage renal disease: Secondary | ICD-10-CM

## 2019-04-04 DIAGNOSIS — Z992 Dependence on renal dialysis: Secondary | ICD-10-CM

## 2019-04-04 DIAGNOSIS — G959 Disease of spinal cord, unspecified: Secondary | ICD-10-CM

## 2019-04-04 LAB — CBC
HCT: 41.2 % (ref 39.0–52.0)
Hemoglobin: 13.4 g/dL (ref 13.0–17.0)
MCH: 32.1 pg (ref 26.0–34.0)
MCHC: 32.5 g/dL (ref 30.0–36.0)
MCV: 98.8 fL (ref 80.0–100.0)
Platelets: 127 10*3/uL — ABNORMAL LOW (ref 150–400)
RBC: 4.17 MIL/uL — ABNORMAL LOW (ref 4.22–5.81)
RDW: 14.1 % (ref 11.5–15.5)
WBC: 6.3 10*3/uL (ref 4.0–10.5)
nRBC: 0 % (ref 0.0–0.2)

## 2019-04-04 LAB — RENAL FUNCTION PANEL
Albumin: 3.7 g/dL (ref 3.5–5.0)
Anion gap: 17 — ABNORMAL HIGH (ref 5–15)
BUN: 45 mg/dL — ABNORMAL HIGH (ref 6–20)
CO2: 21 mmol/L — ABNORMAL LOW (ref 22–32)
Calcium: 8.5 mg/dL — ABNORMAL LOW (ref 8.9–10.3)
Chloride: 95 mmol/L — ABNORMAL LOW (ref 98–111)
Creatinine, Ser: 7.82 mg/dL — ABNORMAL HIGH (ref 0.61–1.24)
GFR calc Af Amer: 9 mL/min — ABNORMAL LOW
GFR calc non Af Amer: 7 mL/min — ABNORMAL LOW
Glucose, Bld: 120 mg/dL — ABNORMAL HIGH (ref 70–99)
Phosphorus: 6.5 mg/dL — ABNORMAL HIGH (ref 2.5–4.6)
Potassium: 5.3 mmol/L — ABNORMAL HIGH (ref 3.5–5.1)
Sodium: 133 mmol/L — ABNORMAL LOW (ref 135–145)

## 2019-04-04 LAB — LIPID PANEL
Cholesterol: 180 mg/dL (ref 0–200)
HDL: 69 mg/dL (ref >40)
LDL Cholesterol: 96 mg/dL (ref 0–99)
Total CHOL/HDL Ratio: 2.6 RATIO
Triglycerides: 75 mg/dL (ref <150)
VLDL: 15 mg/dL (ref 0–40)

## 2019-04-04 LAB — HEMOGLOBIN A1C
Hgb A1c MFr Bld: 5.3 % (ref 4.8–5.6)
Mean Plasma Glucose: 105.41 mg/dL

## 2019-04-04 MED ORDER — SODIUM CHLORIDE 0.9 % IV SOLN: 100.0000 mL | INTRAVENOUS | Status: DC | PRN

## 2019-04-04 MED ORDER — CEFAZOLIN SODIUM-DEXTROSE 2-4 GM/100ML-% IV SOLN
2.0000 g | INTRAVENOUS | Status: AC
  Administered 2019-04-05: 2 g via INTRAVENOUS
  Filled 2019-04-04 (×2): qty 100

## 2019-04-04 MED ORDER — AMLODIPINE BESYLATE 2.5 MG PO TABS
2.5000 mg | ORAL_TABLET | Freq: Every day | ORAL | Status: DC
  Administered 2019-04-06: 2.5 mg via ORAL
  Filled 2019-04-04: qty 1

## 2019-04-04 MED ORDER — OXYCODONE HCL 5 MG PO TABS
5.0000 mg | ORAL_TABLET | Freq: Four times a day (QID) | ORAL | Status: DC | PRN
  Administered 2019-04-04 (×2): 5 mg via ORAL
  Administered 2019-04-04 – 2019-04-13 (×24): 10 mg via ORAL
  Filled 2019-04-04 (×8): qty 2
  Filled 2019-04-04: qty 1
  Filled 2019-04-04 (×20): qty 2

## 2019-04-04 MED ORDER — CHLORHEXIDINE GLUCONATE CLOTH 2 % EX PADS
6.0000 | MEDICATED_PAD | Freq: Every day | CUTANEOUS | Status: DC
  Administered 2019-04-05 – 2019-04-07 (×3): 6 via TOPICAL

## 2019-04-04 MED ORDER — HEPARIN SODIUM (PORCINE) 1000 UNIT/ML DIALYSIS: 1000.0000 [IU] | INTRAMUSCULAR | Status: DC | PRN

## 2019-04-04 MED ORDER — ALTEPLASE 2 MG IJ SOLR: 2.0000 mg | Freq: Once | INTRAMUSCULAR | Status: DC | PRN

## 2019-04-04 MED ORDER — LIDOCAINE HCL (PF) 1 % IJ SOLN: 5.0000 mL | INTRAMUSCULAR | Status: DC | PRN

## 2019-04-04 MED ORDER — LIDOCAINE-PRILOCAINE 2.5-2.5 % EX CREA
1.0000 "application " | TOPICAL_CREAM | CUTANEOUS | Status: DC | PRN
  Filled 2019-04-04: qty 5

## 2019-04-04 MED ORDER — PENTAFLUOROPROP-TETRAFLUOROETH EX AERO: 1.0000 "application " | INHALATION_SPRAY | CUTANEOUS | Status: DC | PRN

## 2019-04-04 NOTE — Progress Notes (Signed)
No new events or problems overnight.  Patient's pain well controlled.  Still with some complaints of paresthesias into his arms and legs.  Patient using arms and legs well.  Dexterity good.  Able to feed himself.  I reviewed the patient's MRI scan of his cervical spine.  This demonstrates evidence of pathological fracturing of C6 and C7 with marked kyphosis and severe spinal stenosis with cord compression.  No evidence of active infection.  Imaging is consistent with renal osteodystrophy.  Patient with severe cervical spine deformity with progressive cervical myelopathy.  There are no nonoperative interventions that will improve his situation.  I discussed situation with the patient with the aid of an interpreter.  I recommend that we move forward with a C6 and C7 corpectomy with interbody strut graft fusion from C5-T1 utilizing interbody cage with anterior plate instrumentation.  Because the patient's extremely poor bone quality this would also require posterior instrumentation for further support.  I have discussed the possibility of also undergoing posterior cervical fixation and fusion utilizing lateral mass and pedicle screw instrumentation from C5-T1.  I have discussed the risks involved with surgery including but not limited to the risk of anesthesia, bleeding, infection, CSF leak, nerve root injury, spinal cord injury, fusion failure, instrumentation failure, dysphagia, dysphonia, continued pain, non-benefit.  The patient has been given the opportunity ask questions.  He appears to understand.  He wishes to proceed with surgery.  It would be helpful if patient could be dialyzed today.  Postoperatively his metabolic bone disease requires aggressive treatment.  I think he likely would benefit from recombinant PTH therapy.

## 2019-04-04 NOTE — Consult Note (Addendum)
Cardiology Consultation:   Patient ID: Mario [name] MRN: [id number]; DOB: 02/15/1971  Admit date: 04/03/2019 Date of Consult: 04/04/2019  Primary Care Provider: [name], MD Primary Cardiologist: Ena Dawley, MD  Primary Electrophysiologist:  None    Patient Profile:   Mario [name] is a 48 y.o. male with a hx of ESRD on HD TTS,hx  chronic combined systolic and diastolic heart failure, LFEV 40% in 2017, mild regurgitation, cirrhosis, and HTN who is being seen today for the evaluation of preoperative clearance at the request of Dr. Algis Liming.  History of Present Illness:   [name] previously seen by Dr. Humphrey Rolls in 2017 during a hospitalization for chest pain. EKG was nonischemic. Situation was complicated by suspected alcohol/drug withdrawal vs renal failure with altered mental status. Pt continued to complain of occasional chest pain. Troponin was very mildly elevated and flat int he 0.03-0.06 range thought to be related to ESRD. Planned for OP lexiscan myoview. It does not appear this was completed. Echocardiogram at that time with EF of 40% and grade 1 DD, PA peak pressure 42 mmHg, mild MR and moderate TR.   He presented to [facility] with back and neck pain with bilateral upper extremity pain and tingling, numbness, and weakness. 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 Transferred to [facility] for neurosurgery consultation. Plan for extensive back surgery tomorrow.  Given his past reduced EF and multiple risk factors for CAD, cardiology was consulted for preoperative evaluation.  On my interview, a spanish interpreter was used. He denies chest pain, orthopnea, palpitations, lower extremity swelling, and syncope. He does report shortness of breath that is brief, but can occur with rest or exertion. He does smoke 6 cigarettes per day. He denies a family history of heart disease and heart attacks. He  is originally from [place], moved to [place] from [place] 3 years ago due to cost of living and family in the area. He has cousins and nieces and nephews here, no children. He does not work because finding work is difficult on HD. Prior to his current pain, he was able to complete 4.0 METS without anginal symptoms.   HS troponin 39 --> 38 --> 27 This is consistent with his prior ?chronically mildly elevated troponin. EKG without signs of acute ischemia.   Echo this admission with EF improved to 93-23%, diastolic dysfunction, moderate MAC, normal tricuspid valve, no MR.     Heart Pathway Score:     Past Medical History:  Diagnosis Date   Abdominal pain 08/28/2016   Acute hyperkalemia 10/28/2016   Chest pain 04/11/2016   Chronic combined systolic and diastolic CHF (congestive heart failure) ([place])    Cirrhosis ([place])    Dialysis patient ([place])    ESRD (end stage renal disease) on dialysis ([place]) 01/25/2015   Fluid overload 04/12/2016   HTN (hypertension) 10/13/2016   Hyperkalemia 11/27/2016   Hypertension    Mitral regurgitation    Pulmonary edema 07/03/2016   Renal disorder    Renal insufficiency    Scrotal infection 01/25/2015    Past Surgical History:  Procedure Laterality Date   AV FISTULA PLACEMENT       Home Medications:  Prior to Admission medications   Medication Sig Start Date End Date Taking? Authorizing Provider  acetaminophen (TYLENOL) 500 MG tablet Take 500 mg by mouth every 6 (six) hours as needed for mild pain.   Yes [provider]  ferric citrate (AURYXIA)  1 GM 210 MG(Fe) tablet Take 420 mg by mouth 3 (three) times daily with meals.    Yes [provider]    Inpatient Medications: Scheduled Meds:  dexamethasone (DECADRON) injection  4 mg Intravenous Q6H   famotidine  20 mg Oral Daily   heparin  5,000 Units Subcutaneous Q8H   sodium chloride flush  3 mL Intravenous Q12H   Continuous Infusions:  sodium chloride     PRN  Meds: sodium chloride, acetaminophen **OR** acetaminophen, HYDROmorphone (DILAUDID) injection, ondansetron (ZOFRAN) IV, oxyCODONE, sodium chloride flush  Allergies:    Allergies  Allergen Reactions   Betadine [Povidone Iodine] Rash    Social History:   Social History   Socioeconomic History   Marital status: Single    Spouse name: Not on file   Number of children: Not on file   Years of education: Not on file   Highest education level: Not on file  Occupational History   Not on file  Social Needs   Financial resource strain: Not on file   Food insecurity    Worry: Not on file    Inability: Not on file   Transportation needs    Medical: Not on file    Non-medical: Not on file  Tobacco Use   Smoking status: Current Every Day Smoker    Packs/day: 0.25    Years: 25.00    Pack years: 6.25    Types: Cigarettes   Smokeless tobacco: Never Used  Substance and Sexual Activity   Alcohol use: No    Comment: pt denies   Drug use: No   Sexual activity: Not on file  Lifestyle   Physical activity    Days per week: Not on file    Minutes per session: Not on file   Stress: Not on file  Relationships   Social connections    Talks on phone: Not on file    Gets together: Not on file    Attends religious service: Not on file    Active member of club or organization: Not on file    Attends meetings of clubs or organizations: Not on file    Relationship status: Not on file   Intimate partner violence    Fear of current or ex partner: Not on file    Emotionally abused: Not on file    Physically abused: Not on file    Forced sexual activity: Not on file  Other Topics Concern   Not on file  Social History Narrative   Not on file    Family History:    Family History  Problem Relation Age of Onset   Kidney failure Father    Diabetes Mother      ROS:  Please see the history of present illness.   All other ROS reviewed and negative.     Physical  Exam/Data:   Vitals:   04/03/19 1649 04/03/19 2339 04/04/19 0350 04/04/19 0815  BP: (!) 198/77 (!) 161/65 111/88 (!) 159/86  Pulse: 72 67  70  Resp: _0 Temp: 98.7 F (37.1 C) 97.6 F (36.4 C) 97.6 F (36.4 C)   TempSrc: Oral Oral Oral   SpO2:  98%      Intake/Output Summary (Last 24 hours) at 04/04/2019 1128 Last data filed at 04/04/2019 0900 Gross per 24 hour  Intake 613 ml  Output 0 ml  Net 613 ml   Last 3 Weights 04/01/2019 04/01/2019 02/22/2019  Weight (lbs) 144 lb 13.5 oz 145 lb  8.1 oz 136 lb 11 oz  Weight (kg) 65.7 kg 66 kg 62 kg     There is no height or weight on file to calculate BMI.  General:  Well nourished, well developed, in no acute distress - in a C-collar HEENT: normal Neck: no JVD Vascular: No carotid bruits Cardiac:  loud S1, S2; RRR Lungs:  clear to auscultation bilaterally, no wheezing, rhonchi or rales  Abd: soft, nontender, no hepatomegaly  Ext: no edema Musculoskeletal:  No deformities, BUE and BLE strength normal and equal Skin: warm and dry  Neuro:  CNs 2-12 intact, no focal abnormalities noted Psych:  Normal affect   EKG:  The EKG was personally reviewed and demonstrates:  Sinus rhythm, HR 80 Telemetry:  Telemetry was personally reviewed and demonstrates:  Sinus rhythm in the 60-70s  Relevant CV Studies:  Echo 04/03/19:  1. The left ventricle has normal systolic function with an ejection fraction of 60-65%. The cavity size was normal. There is moderately increased left ventricular wall thickness. Left ventricular diastolic Doppler parameters are consistent with  pseudonormalization. Elevated left atrial and left ventricular end-diastolic pressures The E/e' is >20. No evidence of left ventricular regional wall motion abnormalities.  2. The right ventricle has normal systolic function. The cavity was normal. There is no increase in right ventricular wall thickness.  3. The mitral valve is grossly normal. Mild calcification of the mitral  valve leaflet. There is moderate mitral annular calcification present.  4. The tricuspid valve is grossly normal.  5. The aortic valve is abnormal. Mild calcification of the aortic valve. Mild stenosis of the aortic valve.  6. The aorta is normal unless otherwise noted.  7. The inferior vena cava was normal in size with <50% respiratory variability.   Echo 04/12/16: Study Conclusions - Left ventricle: The cavity size was moderately dilated. Systolic   function was moderately reduced. The estimated ejection fraction   was 40%. Hypokinesis of the anteroseptal myocardium. Doppler   parameters are consistent with abnormal left ventricular   relaxation (grade 1 diastolic dysfunction). - Mitral valve: There was mild regurgitation. - Right ventricle: The cavity size was mildly dilated. - Tricuspid valve: There was moderate regurgitation. - Pulmonary arteries: PA peak pressure: 42 mm Hg (S). - Pericardium, extracardiac: A trivial pericardial effusion was   identified posterior to the heart. Features were not consistent   with tamponade physiology.  Impressions: - The right ventricular systolic pressure was increased consistent   with moderate pulmonary hypertension. Moderate to severe LV   dysfunction with wall motion abnormality suggestive of coronary   artery disease. There is also moderate pulmonary hypertension.   Laboratory Data:  High Sensitivity Troponin:   Recent Labs  Lab 04/01/19 1024 04/01/19 1401 04/03/19 0652  TROPONINIHS 39* 38* 27*     Chemistry Recent Labs  Lab 04/01/19 1024 04/03/19 0652  NA 132* 135  K 4.2 4.7  CL 92* 97*  CO2 25 23  GLUCOSE 161* 144*  BUN 31* 25*  CREATININE 7.36* 6.04*  CALCIUM 9.1 8.9  GFRNONAA 8* 10*  GFRAA 9* 12*  ANIONGAP 15 15    Recent Labs  Lab 04/03/19 0652  PROT 6.6  ALBUMIN 3.2*  AST 24  ALT 18  ALKPHOS 201*  BILITOT 0.9   Hematology Recent Labs  Lab 04/01/19 1024 04/03/19 0652 04/04/19 0733  WBC 5.4 3.1*  6.3  RBC 3.91* 4.09* 4.17*  HGB 12.5* 13.2 13.4  HCT 38.6* 40.0 41.2  MCV 98.7 97.8 98.8  MCH 32.0 32.3 32.1  MCHC 32.4 33.0 32.5  RDW 13.9 14.0 14.1  PLT 146* 128* 127*   BNPNo results for input(s): BNP, PROBNP in the last 168 hours.  DDimer No results for input(s): DDIMER in the last 168 hours.   Radiology/Studies:  Ct Cervical Spine Wo Contrast  Result Date: 04/01/2019 CLINICAL DATA:  Neck pain. Bilateral arm numbness. Dialysis patient EXAM: CT CERVICAL SPINE WITHOUT CONTRAST TECHNIQUE: Multidetector CT imaging of the cervical spine was performed without intravenous contrast. Multiplanar CT image reconstructions were also generated. COMPARISON:  CT cervical spine 03/23/2018 FINDINGS: Alignment: Mild retrolisthesis C3-4. Mild anterolisthesis C4-5. Marked kyphosis centered at C6-7 measuring 31 degrees. Kyphosis has progressed since the prior study due to progressive erosive changes of C6 and C7 vertebral bodies. Skull base and vertebrae: Erosive endplate changes at E7-0, C6-7, C7-T1 most likely due to spondyloarthropathy of dialysis. Progressive changes at C6-7 since the prior CT Soft tissues and spinal canal: Negative Disc levels:  C2-3: Negative C3-4: Well corticated erosive changes in the endplates at J5-0 similar to the prior study. No significant spinal stenosis C4-5: Negative C5-6: Negative C6-7: Erosive changes in the endplates bilaterally has progressed in the interval. There are erosive changes with loss of vertebral body volume of C6 and C7. Only a small amount of vertebral bodies present in the inferior endplate of C7. There is marked kyphosis at this level. Posterior spurring is present with mild spinal stenosis. Left facet subluxation with either degenerative or erosive changes in the facet. Possible instability. C7-T1: Erosive endplate changes similar to the prior study. 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 Marked progression of bony erosion at C6-7 with 31 degrees of kyphosis which has progressed in the interval. Subluxation of the left C6-7 facet which may be degenerative or due to erosive arthropathy. Surgical consultation is suggested. These results were called by telephone at the time of interpretation on 04/01/2019 at 3:13 pm to Dr. Quentin Cornwall , who verbally acknowledged these results. Electronically Signed   By: Franchot Gallo M.D.   On: 04/01/2019 15:14   Mr Cervical Spine Wo Contrast  Result Date: 04/03/2019 CLINICAL DATA:  Instead age renal disease on chronic hemodialysis. Progressive neck pain with upper extremity pain, tingling and weakness. Pathologic fracture. EXAM: MRI CERVICAL SPINE WITHOUT CONTRAST TECHNIQUE: Multiplanar, multisequence MR imaging of the cervical spine was performed. No intravenous contrast was administered. COMPARISON:  CT 04/01/2019 and 03/23/2018 FINDINGS: Alignment: Kyphotic curvature of the spine at C6-7. Vertebrae: Chronic endplate irregularity at C3-4. Chronic but progressive collapse of the C6 and C7 vertebral bodies with kyphotic angulation and retropulsion. Chronic but progressive superior endplate deformity at T1. Cord: Cord indentation at C3-4 and C4-5 because of spinal stenosis with AP diameter of the canal 7-7.5 mm. Spinal stenosis at C6-7 with AP diameter of the canal only 6.3 mm with cord deformity. Abnormal T2 signal cord. Posterior Fossa, vertebral arteries, paraspinal tissues: Otherwise negative Disc levels: Foramen magnum is widely patent.  C1-2 and C2-3 are normal. C3-4: Chronic spondylosis/spondyloarthropathy. Bulging of the disc. Mild ligamentous hypertrophy. Spinal stenosis with AP diameter 7.3 mm. Slight indentation of cord. Bilateral foraminal stenosis. C4-5: Bulging of the disc. Ligamentous hypertrophy. Spinal stenosis with AP diameter of the canal 7.6 mm. Slight indentation of the  cord. Mild foraminal narrowing right more than left. C5-6: Mild bulging of the disc. No compressive canal or foraminal stenosis. C6-7:  Vertebral body collapse and kyphotic angulation as described above. Spinal stenosis with AP diameter of the canal only 6.3 mm. Cord deformity and increased T2 cord signal. Bilateral foraminal stenosis. C7-T1: Mild bulging of the disc. Superior endplate deformity at T1. Canal and foramina sufficiently patent. Upper thoracic region negative. IMPRESSION: Likely dialysis related spondyloarthropathy with vertebral body collapse and kyphotic curvature at C6 and C7. Canal stenosis with AP diameter only 6.3 mm, effacing the subarachnoid space and deforming the cord without abnormal T2 signal within the cord. Severe bilateral foraminal stenosis at this level. Findings are progressive over time. Chronic spondyloarthropathy at C3-4 without progressive change since last year. Canal stenosis with AP diameter measuring 7.3 mm. Effacement of the subarachnoid space and slight indentation of the cord. Bilateral foraminal stenosis. C4-5 disc bulge. Canal stenosis with AP diameter of 7.6 mm. Slight indentation of the cord. No foraminal stenosis. Progressive superior endplate deformity at T1 over time but without canal or foraminal stenosis. Electronically Signed   By:  Ch[place] M.D.   On: 04/03/2019 13:27   Dg Chest Portable 1 View  Result Date: 04/01/2019 CLINICAL DATA:  Pain between the shoulder blades and intermittent bilateral arm numbness for 2 weeks. EXAM: PORTABLE CHEST 1 VIEW COMPARISON:  Single-view of the chest 05/01/2018. FINDINGS: There is cardiomegaly and mild interstitial edema. No consolidative process, pneumothorax or effusion. Atherosclerosis noted. No acute or focal bony abnormality. IMPRESSION: Cardiomegaly mild interstitial edema. Atherosclerosis. Electronically Signed   By: Inge Rise M.D.   On: 04/01/2019 11:15    Assessment and Plan:   1. Chronic combined  systolic and diastolic heart failure - EF improved on echo yesterday, continued diastolic dysfunction - no heart failure medications or diuretic regimen at baseline - euvolemic on exam - suspect prior EF was due to alcohol use - stopped drinking 3 years ago - cautious with IVF in the perioperative period  2. Elevated hs troponin - hs troponin 39 --> 38 --> 27 - EKG without signs of acute ischemia - will collect a lipid profile and A1c for risk stratification - prior A1c 5.1% - in absence of abnormal EKG and ongoing symptoms, will defer further workup at this time  3. Hypertension - does not appear to be on home medications - pressure elevated here - LVH - needs better pressure control - will add 2.5 mg norvasc, titrate as needed  4. Current smoker - needs to stop smoking  5. Preoperative evaluation for cardiac risk Patient is ESRD on HD with a prior history of systolic heart failure, now with normal EF. He is on no heart failure medications at baseline. Unclear etiology of prior reduced EF, but suspect alcohol induced. Prior hypokinesis of anteroseptal myocardium - not appreciated on yesterday's echo, but LVH. CAD risk factors include HTN and smoking.  He is not diabetic and has no history of stroke. Given the above, he has a 6.6% (moderate) risk of major cardiac event in the perioperative period. His main apparent potential complication would be diastolic heart failure exacerbation. May need short term diuretic if long surgery requires IVF. Pt understands that he is at increased risk for surgery, but is willing to proceed. No further cardiac workup at this time.   For questions or updates, please contact [place] Please consult [email or web address] for contact info under   Signed, Ledora Bottcher, PA  04/04/2019 11:28 AM    The patient was seen, examined and discussed with Minette Brine , PA-C and I agree with the above.  [name] is a 48 y.o. male with a hx of ESRD on  HD, h/o impaired LVEF of 40% in 2017 with regional wall motion abnormalities seen by Dr Humphrey Rolls, prior alcohol abuse, cirrhosis, and HTN who is being seen today for preoperative evaluation prior to spinal surgery. In 2017 LVEF 40% in the settings of etoh abuse/withdrawal, acute renal failure, he was supposed to undergo a stress test but there are no records about it.  He was admitted with back and neck pain with bilateral upper extremity pain and tingling, numbness, and weakness. 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 Transferred to [facility] for neurosurgery consultation. Plan for extensive back surgery tomorrow.    He has been fairly active until recent orthopedic problems, he denies any recent chest pain, SOB, no LE edema, no palpitations or falls. PE reveals a gentleman in NAD, no JVDs, U2,3 3/6 holosystolic murmur, lungs CTA, no LE edema.  Echo this admission with EF improved to 53-61%, diastolic dysfunction, moderate MAC, normal tricuspid valve, no MR, mild aortic stenosis (mean transaortic gradient 15 mmHg). He has significant mitral annular calcifications and thickened mitral and aortic valve typical for patients on hemodialysis.   A/P  1. Preoperative evaluation  - given patients risk factors (ESRD on HD), physical inactivity, HTN he is considered an intermediate risk for a moderate risk surgery, however he has no acute chest pain, no signs of CHF, normal ECG, normal LVEF, no significant valvular abnormalities. I wouldn't recommend any further testing prior to surgery. We strongly recommend to quit smoking   2. Hypertension - start amlodipine 2.5 mg po daily  3. Elevated troponin - flat trend consistent with ESRD  Ena Dawley, MD 04/04/2019

## 2019-04-04 NOTE — Progress Notes (Signed)
PROGRESS NOTE   Mario [name]  FGH:[id number]    DOB: 06/12/1971    DOA: 04/03/2019  PCP: [name], MD   I have briefly reviewed patients previous medical records in [facility].  Chief complaint Neck pain with radiation to arms and associated weakness.  Brief Narrative:  48 year old Spanish-speaking male, lives alone, independent, PMH of ESRD on TTS HD via LUE AVF, chronic combined systolic and diastolic CHF, LVEF 67% by TTE in 2017, mild mitral regurgitation, cirrhosis, HTN, initially admitted to [facility] on 04/01/2019 due to progressively worsening neck/upper back pain with radiation to bilateral upper extremity with associated tingling, numbness and upper extremity weakness, left> right.  CT cervical spine showed erosive changes compatible with renal spondyloarthropathy at C3-4, C6-7, C7-T1.  Patient was transferred to [facility] for Neurosurgical consultation.  Better after steroids and cervical collar.  As per neurosurgery, plans for extensive C-spine surgery on 9/1.  Requested preop cardiology clearance.  Also requested nephrology to dialyze patient today.   Assessment & Plan:   Principal Problem:   Cervical radiculopathy Active Problems:   ESRD (end stage renal disease) on dialysis (HCC)   HTN (hypertension)   Neck pain   C6 and 7 pathological fracture with severe spinal stenosis with cord compression and cervical myelopathy due to renal osteodystrophy  CT cervical spine at OSH showed erosive changes compatible with renal spondyloarthropathy at C3-4, C6-7, C7-T1.  Continue cervical collar and dexamethasone 4 mg IV every 6 hourly.  Added Pepcid for GI prophylaxis.  Neurosurgery consultation appreciated, no indication for emergent surgery, reconstructive options are very difficult, getting MRI C-spine without contrast to rule out infectious etiology and likely surgery mid next week.  TTE results appreciated, although EF has normalized, patient has multiple CAD risk  factors and given previous low EF with hypokinesis and plan for extensive C-spine surgery, requested preop cardiology clearance.  Pain not controlled with current regimen and will add OxyIR.  As per communication with Dr. Trenton Gammon, plan for surgery on 9/1 at around 10 AM and he requests that patient be dialyzed today, updated nephrology.  As per neurosurgery, MRI C-spine shows pathological fracture of C6 and 7 with marked kyphosis and severe spinal stenosis with cord compression consistent with renal osteodystrophy.  ESRD on TTS HD  Had HD at OSH on 8/29.  Nephrology consulted for HD needs.  Requested HD today in preparation for surgery tomorrow.  Secondary hyperparathyroidism  Management per nephrology.  Tobacco abuse  Cessation counseled.  Patient declines nicotine patch.  Chronic combined systolic and diastolic CHF  Last TTE 03/12/3809 showed LVEF 40% with hypokinesis of anteroseptal myocardium and grade 1 diastolic dysfunction.  Mild MR, moderate TR, moderate pulmonary hypertension.  Clinically euvolemic.  Volume management across HD.  Repeat TTE shows normal EF and no MR.  As stated above, requested preop cardiology clearance.  Elevated troponin  HS troponin mildly elevated but flat trend, 39 > 38 > 27  No anginal symptoms.  Likely related to ESRD.  Cirrhosis/splenomegaly  Noted on abdominal ultrasound 05/02/2018.  Had prior paracentesis 04/2018.  Quit alcohol intake 3 years ago.  Currently without clinical ascites.  INR 1.2 on 8/30.  Essential hypertension  Mildly uncontrolled.  Similar urgency resolved.  PRN IV hydralazine.  Thrombocytopenia  Appears chronic and stable, possibly related to cirrhosis.  Also has mild leukopenia.  Follow CBCs.   DVT prophylaxis: Subcutaneous heparin Code Status: Full Family Communication: None at bedside Disposition: To be determined pending clinical improvement   Consultants:  Neurosurgery Nephrology Cardiology  Procedures:  Cervical collar  Antimicrobials:  None   Subjective: Patient interviewed and examined along with video interpreter and patient's RN at bedside.  Ongoing left upper back 8-9/10 pain.  Denies any other complaints.  Objective:  Vitals:   04/03/19 1649 04/03/19 2339 04/04/19 0350 04/04/19 0815  BP: (!) 198/77 (!) 161/65 111/88 (!) 159/86  Pulse: 72 67  70  Resp: 20 15 12 13   Temp: 98.7 F (37.1 C) 97.6 F (36.4 C) 97.6 F (36.4 C)   TempSrc: Oral Oral Oral   SpO2:  98%      Examination:  General exam: Pleasant young male, moderately built and nourished sitting up comfortably in bed distress.  Has cervical collar. Respiratory system: Clear to auscultation.  No increased work of breathing Cardiovascular system: S1 and S2 heard, RRR.  No JVD or pedal edema.  3/6 systolic murmur best heard at apex, same side as LUE AVF.  Telemetry personally reviewed: Sinus rhythm. Gastrointestinal system: Abdomen is nondistended, soft and nontender. No organomegaly or masses felt. Normal bowel sounds heard. Central nervous system: Alert and oriented. No focal neurological deficits. Extremities: Symmetric 4+ by 5 power symmetrically in bilateral upper extremities, seems weaker at shoulder level.  Symmetric 5 x 5 power in lower extremities.  Sensory appears intact.  Symmetric 2+ reflexes. Skin: No rashes, lesions or ulcers Psychiatry: Judgement and insight appear normal. Mood & affect appropriate.     Data Reviewed: I have personally reviewed following labs and imaging studies  CBC: Recent Labs  Lab 04/01/19 1024 04/03/19 0652 04/04/19 0733  WBC 5.4 3.1* 6.3  HGB 12.5* 13.2 13.4  HCT 38.6* 40.0 41.2  MCV 98.7 97.8 98.8  PLT 146* 128* 829*   Basic Metabolic Panel: Recent Labs  Lab 04/01/19 1024 04/02/19 0405 04/03/19 0652  NA 132*  --  135  K 4.2  --  4.7  CL 92*  --  97*  CO2 25  --  23  GLUCOSE 161*  --  144*  BUN 31*  --   25*  CREATININE 7.36*  --  6.04*  CALCIUM 9.1  --  8.9  PHOS  --  7.8*  --    Liver Function Tests: Recent Labs  Lab 04/03/19 0652  AST 24  ALT 18  ALKPHOS 201*  BILITOT 0.9  PROT 6.6  ALBUMIN 3.2*    Cardiac Enzymes: No results for input(s): CKTOTAL, CKMB, CKMBINDEX, TROPONINI in the last 168 hours.  CBG: Recent Labs  Lab 04/03/19 1648  GLUCAP 157*    Recent Results (from the past 240 hour(s))  SARS Coronavirus 2 [facility] order, Performed in [facility] hospital lab) Nasopharyngeal Nasopharyngeal Swab     Status: None   Collection Time: 04/01/19 11:15 AM   Specimen: Nasopharyngeal Swab  Result Value Ref Range Status   SARS Coronavirus 2 NEGATIVE NEGATIVE Final    Comment: (NOTE) If result is NEGATIVE SARS-CoV-2 target nucleic acids are NOT DETECTED. The SARS-CoV-2 RNA is generally detectable in upper and lower  respiratory specimens during the acute phase of infection. The lowest  concentration of SARS-CoV-2 viral copies this assay can detect is 250  copies / mL. A negative result does not preclude SARS-CoV-2 infection  and should not be used as the sole basis for treatment or other  patient management decisions.  A negative result may occur with  improper specimen collection / handling, submission of specimen other  than nasopharyngeal swab, presence of viral mutation(s) within the  areas targeted by this assay, and inadequate number of viral copies  (<250 copies / mL). A negative result must be combined with clinical  observations, patient history, and epidemiological information. If result is POSITIVE SARS-CoV-2 target nucleic acids are DETECTED. The SARS-CoV-2 RNA is generally detectable in upper and lower  respiratory specimens dur ing the acute phase of infection.  Positive  results are indicative of active infection with SARS-CoV-2.  Clinical  correlation with patient history and other diagnostic information is  necessary to determine patient infection  status.  Positive results do  not rule out bacterial infection or co-infection with other viruses. If result is PRESUMPTIVE POSTIVE SARS-CoV-2 nucleic acids MAY BE PRESENT.   A presumptive positive result was obtained on the submitted specimen  and confirmed on repeat testing.  While 2019 novel coronavirus  (SARS-CoV-2) nucleic acids may be present in the submitted sample  additional confirmatory testing may be necessary for epidemiological  and / or clinical management purposes  to differentiate between  SARS-CoV-2 and other Sarbecovirus currently known to infect humans.  If clinically indicated additional testing with an alternate test  methodology [phone]) is advised. The SARS-CoV-2 RNA is generally  detectable in upper and lower respiratory sp ecimens during the acute  phase of infection. The expected result is Negative. Fact Sheet for Patients:  StrictlyIdeas.no Fact Sheet for Healthcare Providers: BankingDealers.co.za This test is not yet approved or cleared by the [place] FDA and has been authorized for detection and/or diagnosis of SARS-CoV-2 by FDA under an Emergency Use Authorization (EUA).  This EUA will remain in effect (meaning this test can be used) for the duration of the COVID-19 declaration under Section 564(b)(1) of the Act, 21 U.S.C. section 360bbb-3(b)(1), unless the authorization is terminated or revoked sooner. Performed at [facility], [address] [place], [place] [address]   MRSA PCR Screening     Status: None   Collection Time: 04/03/19  6:32 AM   Specimen: Nasal Mucosa; Nasopharyngeal  Result Value Ref Range Status   MRSA by PCR NEGATIVE NEGATIVE Final    Comment:        The GeneXpert MRSA Assay (FDA approved for NASAL specimens only), is one component of a comprehensive MRSA colonization surveillance program. It is not intended to diagnose MRSA infection nor to guide or monitor  treatment for MRSA infections. Performed at [facility], [place] [address], [place],  [address]          Radiology Studies: Mr Cervical Spine Wo Contrast  Result Date: 04/03/2019 CLINICAL DATA:  Instead age renal disease on chronic hemodialysis. Progressive neck pain with upper extremity pain, tingling and weakness. Pathologic fracture. EXAM: MRI CERVICAL SPINE WITHOUT CONTRAST TECHNIQUE: Multiplanar, multisequence MR imaging of the cervical spine was performed. No intravenous contrast was administered. COMPARISON:  CT 04/01/2019 and 03/23/2018 FINDINGS: Alignment: Kyphotic curvature of the spine at C6-7. Vertebrae: Chronic endplate irregularity at C3-4. Chronic but progressive collapse of the C6 and C7 vertebral bodies with kyphotic angulation and retropulsion. Chronic but progressive superior endplate deformity at T1. Cord: Cord indentation at C3-4 and C4-5 because of spinal stenosis with AP diameter of the canal 7-7.5 mm. Spinal stenosis at C6-7 with AP diameter of the canal only 6.3 mm with cord deformity. Abnormal T2 signal cord. Posterior Fossa, vertebral arteries, paraspinal tissues: Otherwise negative Disc levels: Foramen magnum is widely patent.  C1-2 and C2-3 are normal. C3-4: Chronic spondylosis/spondyloarthropathy. Bulging of the disc. Mild ligamentous hypertrophy. Spinal stenosis with AP  diameter 7.3 mm. Slight indentation of cord. Bilateral foraminal stenosis. C4-5: Bulging of the disc. Ligamentous hypertrophy. Spinal stenosis with AP diameter of the canal 7.6 mm. Slight indentation of the cord. Mild foraminal narrowing right more than left. C5-6: Mild bulging of the disc. No compressive canal or foraminal stenosis. C6-7: Vertebral body collapse and kyphotic angulation as described above. Spinal stenosis with AP diameter of the canal only 6.3 mm. Cord deformity and increased T2 cord signal. Bilateral foraminal stenosis. C7-T1: Mild bulging of the disc. Superior endplate  deformity at T1. Canal and foramina sufficiently patent. Upper thoracic region negative. IMPRESSION: Likely dialysis related spondyloarthropathy with vertebral body collapse and kyphotic curvature at C6 and C7. Canal stenosis with AP diameter only 6.3 mm, effacing the subarachnoid space and deforming the cord without abnormal T2 signal within the cord. Severe bilateral foraminal stenosis at this level. Findings are progressive over time. Chronic spondyloarthropathy at C3-4 without progressive change since last year. Canal stenosis with AP diameter measuring 7.3 mm. Effacement of the subarachnoid space and slight indentation of the cord. Bilateral foraminal stenosis. C4-5 disc bulge. Canal stenosis with AP diameter of 7.6 mm. Slight indentation of the cord. No foraminal stenosis. Progressive superior endplate deformity at T1 over time but without canal or foraminal stenosis. Electronically Signed   By: Nelson Chimes M.D.   On: 04/03/2019 13:27        Scheduled Meds:  dexamethasone (DECADRON) injection  4 mg Intravenous Q6H   famotidine  20 mg Oral Daily   heparin  5,000 Units Subcutaneous Q8H   sodium chloride flush  3 mL Intravenous Q12H   Continuous Infusions:  sodium chloride       LOS: 1 day     Vernell Leep, MD, FACP, [facility]. [facility]  To contact the attending provider between 7A-7P or the covering provider during after hours 7P-7A, please log into the web site [email or web address] and access using universal [place] password for that web site. If you do not have the password, please call the hospital operator.  04/04/2019, 9:15 AM

## 2019-04-04 NOTE — Progress Notes (Addendum)
[facility] Progress Note   Subjective:   Patient seen and examined at bedside in HD.  No new complaints.  Objective Vitals:   04/04/19 0815 04/04/19 1311 04/04/19 1347 04/04/19 1404  BP: (!) 159/86 (!) 184/84 (!) 198/80 (!) 180/79  Pulse: 70 68 69 67  Resp: 13  16 15   Temp:  97.8 F (36.6 C) 98.2 F (36.8 C)   TempSrc:  Oral Oral   SpO2:  100% 99%   Weight:   66.6 kg    Physical Exam General:NAD, chronically ill appearing male, laying in bed Heart:RRR Lungs:CTAB Abdomen:soft, NTND Extremities:no LE edema Dialysis Access: LU AVF cannulated   Filed Weights   04/04/19 1347  Weight: 66.6 kg    Intake/Output Summary (Last 24 hours) at 04/04/2019 1425 Last data filed at 04/04/2019 1300 Gross per 24 hour  Intake 973 ml  Output 0 ml  Net 973 ml    Additional Objective Labs: Basic Metabolic Panel: Recent Labs  Lab 04/01/19 1024 04/02/19 0405 04/03/19 0652  NA 132*  --  135  K 4.2  --  4.7  CL 92*  --  97*  CO2 25  --  23  GLUCOSE 161*  --  144*  BUN 31*  --  25*  CREATININE 7.36*  --  6.04*  CALCIUM 9.1  --  8.9  PHOS  --  7.8*  --    Liver Function Tests: Recent Labs  Lab 04/03/19 0652  AST 24  ALT 18  ALKPHOS 201*  BILITOT 0.9  PROT 6.6  ALBUMIN 3.2*   No results for input(s): LIPASE, AMYLASE in the last 168 hours. CBC: Recent Labs  Lab 04/01/19 1024 04/03/19 0652 04/04/19 0733  WBC 5.4 3.1* 6.3  HGB 12.5* 13.2 13.4  HCT 38.6* 40.0 41.2  MCV 98.7 97.8 98.8  PLT 146* 128* 127*   Blood Culture    Component Value Date/Time   SDES  05/03/2018 1345    PLEURAL Performed at [facility], [address] [place], [place] [address]    [facility]  05/03/2018 1345    NONE Performed at [facility], [address], [place], [place] [address]    CULT  05/03/2018 1345    NO GROWTH 3 DAYS Performed at [facility], [place] [address] [place], [place] [address]    REPTSTATUS 05/07/2018 FINAL 05/03/2018  1345    Cardiac Enzymes: No results for input(s): CKTOTAL, CKMB, CKMBINDEX, TROPONINI in the last 168 hours. CBG: Recent Labs  Lab 04/03/19 1648  GLUCAP 157*   Iron Studies: No results for input(s): IRON, TIBC, TRANSFERRIN, FERRITIN in the last 72 hours. Lab Results  Component Value Date   INR 1.2 04/03/2019   INR 1.17 01/22/2018   INR 1.19 02/25/2017   Studies/Results: Mr Cervical Spine Wo Contrast  Result Date: 04/03/2019 CLINICAL DATA:  Instead age renal disease on chronic hemodialysis. Progressive neck pain with upper extremity pain, tingling and weakness. Pathologic fracture. EXAM: MRI CERVICAL SPINE WITHOUT CONTRAST TECHNIQUE: Multiplanar, multisequence MR imaging of the cervical spine was performed. No intravenous contrast was administered. COMPARISON:  CT 04/01/2019 and 03/23/2018 FINDINGS: Alignment: Kyphotic curvature of the spine at C6-7. Vertebrae: Chronic endplate irregularity at C3-4. Chronic but progressive collapse of the C6 and C7 vertebral bodies with kyphotic angulation and retropulsion. Chronic but progressive superior endplate deformity at T1. Cord: Cord indentation at C3-4 and C4-5 because of spinal stenosis with AP diameter of the canal 7-7.5 mm. Spinal stenosis at C6-7  with AP diameter of the canal only 6.3 mm with cord deformity. Abnormal T2 signal cord. Posterior Fossa, vertebral arteries, paraspinal tissues: Otherwise negative Disc levels: Foramen magnum is widely patent.  C1-2 and C2-3 are normal. C3-4: Chronic spondylosis/spondyloarthropathy. Bulging of the disc. Mild ligamentous hypertrophy. Spinal stenosis with AP diameter 7.3 mm. Slight indentation of cord. Bilateral foraminal stenosis. C4-5: Bulging of the disc. Ligamentous hypertrophy. Spinal stenosis with AP diameter of the canal 7.6 mm. Slight indentation of the cord. Mild foraminal narrowing right more than left. C5-6: Mild bulging of the disc. No compressive canal or foraminal stenosis. C6-7: Vertebral body  collapse and kyphotic angulation as described above. Spinal stenosis with AP diameter of the canal only 6.3 mm. Cord deformity and increased T2 cord signal. Bilateral foraminal stenosis. C7-T1: Mild bulging of the disc. Superior endplate deformity at T1. Canal and foramina sufficiently patent. Upper thoracic region negative. IMPRESSION: Likely dialysis related spondyloarthropathy with vertebral body collapse and kyphotic curvature at C6 and C7. Canal stenosis with AP diameter only 6.3 mm, effacing the subarachnoid space and deforming the cord without abnormal T2 signal within the cord. Severe bilateral foraminal stenosis at this level. Findings are progressive over time. Chronic spondyloarthropathy at C3-4 without progressive change since last year. Canal stenosis with AP diameter measuring 7.3 mm. Effacement of the subarachnoid space and slight indentation of the cord. Bilateral foraminal stenosis. C4-5 disc bulge. Canal stenosis with AP diameter of 7.6 mm. Slight indentation of the cord. No foraminal stenosis. Progressive superior endplate deformity at T1 over time but without canal or foraminal stenosis. Electronically Signed   By: Nelson Chimes M.D.   On: 04/03/2019 13:27    Medications: . sodium chloride    . sodium chloride    . sodium chloride    .  ceFAZolin (ANCEF) IV     . amLODipine  2.5 mg Oral Daily  . [START ON 04/05/2019] Chlorhexidine Gluconate Cloth  6 each Topical Q0600  . dexamethasone (DECADRON) injection  4 mg Intravenous Q6H  . famotidine  20 mg Oral Daily  . heparin  5,000 Units Subcutaneous Q8H  . sodium chloride flush  3 mL Intravenous Q12H    Dialysis Orders: [facility]phrology with HD TTS at [facility] on [facility] TTS 163min 2K 2.5Ca 800/450 dew 65kg No Heparin hectorol 47mcg qHD   Assessment/Plan: 1. Neck pain/cervical myelopathy - Neurosurgery consulting.  Plan for surgery tomorrow.  2.  ESRD -  On HD TTS.  HD today off schedule due to surgery tomorrow. WIll  resume regular schedule later this week.  3.  Hypertension/volume  - BP elevated.  CXR showed mild pulmonary edema pre HD.  Does not appear volume overloaded today, CTAB/no edema. UF goal 2L today. 4.  Anemia of CKD - Hgb 13.2. No indication for ESA. 5.  Secondary Hyperparathyroidism -  Ca at goal. Phos elevated. Continue binders. 6.  Nutrition - Renal diet w/fluid restrictions 7. Hx CHF - Echo ordered 8. Alcoholic Cirrhosis  Jen Mow, PA-C [facility] Pager: [phone] 04/04/2019,2:25 PM  LOS: 1 day   Nephrology attending: Patient was seen and examined at bedside.  Chart reviewed.  I agree with assessment and plan as above. Plan for neurosurgery intervention tomorrow.  We are doing dialysis today off schedule.  Monitor electrolytes and blood pressure.  Tolerating dialysis well.  Discussed with the primary team.  Lawson Radar, MD [facility]

## 2019-04-05 ENCOUNTER — Inpatient Hospital Stay (HOSPITAL_COMMUNITY): Payer: Medicaid Other | Admitting: Certified Registered"

## 2019-04-05 ENCOUNTER — Inpatient Hospital Stay (HOSPITAL_COMMUNITY)
Admission: AD | Disposition: A | Discharge status: Home Health | Payer: Self-pay | Source: Other Acute Inpatient Hospital | Attending: Family Medicine

## 2019-04-05 ENCOUNTER — Inpatient Hospital Stay (HOSPITAL_COMMUNITY): Payer: Medicaid Other

## 2019-04-05 ENCOUNTER — Encounter (HOSPITAL_COMMUNITY): Payer: Self-pay | Admitting: Certified Registered"

## 2019-04-05 HISTORY — PX: ANTERIOR CERVICAL CORPECTOMY: SHX1159

## 2019-04-05 HISTORY — PX: POSTERIOR CERVICAL FUSION/FORAMINOTOMY: SHX5038

## 2019-04-05 LAB — POCT I-STAT 4, (NA,K, GLUC, HGB,HCT)
Glucose, Bld: 108 mg/dL — ABNORMAL HIGH (ref 70–99)
HCT: 41 % (ref 39.0–52.0)
Hemoglobin: 13.9 g/dL (ref 13.0–17.0)
Potassium: 4.2 mmol/L (ref 3.5–5.1)
Sodium: 136 mmol/L (ref 135–145)

## 2019-04-05 LAB — TYPE AND SCREEN
ABO/RH(D): A POS
Antibody Screen: NEGATIVE

## 2019-04-05 LAB — ABO/RH: ABO/RH(D): A POS

## 2019-04-05 SURGERY — ANTERIOR CERVICAL CORPECTOMY
Anesthesia: General | Site: Spine Cervical

## 2019-04-05 MED ORDER — ROCURONIUM BROMIDE 100 MG/10ML IV SOLN
INTRAVENOUS | Status: DC | PRN
  Administered 2019-04-05: 20 mg via INTRAVENOUS
  Administered 2019-04-05: 50 mg via INTRAVENOUS
  Administered 2019-04-05: 20 mg via INTRAVENOUS

## 2019-04-05 MED ORDER — LABETALOL HCL 5 MG/ML IV SOLN
INTRAVENOUS | Status: AC
  Filled 2019-04-05: qty 4

## 2019-04-05 MED ORDER — BUPIVACAINE HCL (PF) 0.25 % IJ SOLN
INTRAMUSCULAR | Status: AC
  Filled 2019-04-05: qty 30

## 2019-04-05 MED ORDER — LIDOCAINE 2% (20 MG/ML) 5 ML SYRINGE
INTRAMUSCULAR | Status: AC
  Filled 2019-04-05: qty 5

## 2019-04-05 MED ORDER — ACETAMINOPHEN 10 MG/ML IV SOLN
1000.0000 mg | Freq: Once | INTRAVENOUS | Status: AC
  Administered 2019-04-05: 1000 mg via INTRAVENOUS

## 2019-04-05 MED ORDER — BACITRACIN ZINC 500 UNIT/GM EX OINT
TOPICAL_OINTMENT | CUTANEOUS | Status: DC | PRN
  Administered 2019-04-05: 1 via TOPICAL

## 2019-04-05 MED ORDER — PHENYLEPHRINE 40 MCG/ML (10ML) SYRINGE FOR IV PUSH (FOR BLOOD PRESSURE SUPPORT)
PREFILLED_SYRINGE | INTRAVENOUS | Status: AC
  Filled 2019-04-05: qty 10

## 2019-04-05 MED ORDER — ACETAMINOPHEN 10 MG/ML IV SOLN: 1000.0000 mg | Freq: Four times a day (QID) | INTRAVENOUS | Status: DC

## 2019-04-05 MED ORDER — SODIUM CHLORIDE 0.9 % IV SOLN: INTRAVENOUS | Status: DC

## 2019-04-05 MED ORDER — CHLORHEXIDINE GLUCONATE CLOTH 2 % EX PADS
6.0000 | MEDICATED_PAD | Freq: Every day | CUTANEOUS | Status: DC
  Administered 2019-04-06 – 2019-04-07 (×2): 6 via TOPICAL

## 2019-04-05 MED ORDER — ACETAMINOPHEN 10 MG/ML IV SOLN
INTRAVENOUS | Status: AC
  Filled 2019-04-05: qty 100

## 2019-04-05 MED ORDER — PROPOFOL 10 MG/ML IV BOLUS
INTRAVENOUS | Status: DC | PRN
  Administered 2019-04-05: 50 mg via INTRAVENOUS
  Administered 2019-04-05: 100 mg via INTRAVENOUS
  Administered 2019-04-05: 20 mg via INTRAVENOUS
  Administered 2019-04-05: 30 mg via INTRAVENOUS

## 2019-04-05 MED ORDER — BUPIVACAINE HCL (PF) 0.25 % IJ SOLN
INTRAMUSCULAR | Status: DC | PRN
  Administered 2019-04-05: 10 mL

## 2019-04-05 MED ORDER — FENTANYL CITRATE (PF) 100 MCG/2ML IJ SOLN
INTRAMUSCULAR | Status: DC | PRN
  Administered 2019-04-05 (×5): 50 ug via INTRAVENOUS
  Administered 2019-04-05: 150 ug via INTRAVENOUS
  Administered 2019-04-05 (×2): 50 ug via INTRAVENOUS

## 2019-04-05 MED ORDER — MIDAZOLAM HCL 5 MG/5ML IJ SOLN
INTRAMUSCULAR | Status: DC | PRN
  Administered 2019-04-05: 2 mg via INTRAVENOUS

## 2019-04-05 MED ORDER — HYDROMORPHONE HCL 1 MG/ML IJ SOLN
INTRAMUSCULAR | Status: AC
  Filled 2019-04-05: qty 1

## 2019-04-05 MED ORDER — HYDROMORPHONE HCL 1 MG/ML IJ SOLN
0.2500 mg | INTRAMUSCULAR | Status: DC | PRN
  Administered 2019-04-05 (×6): 0.5 mg via INTRAVENOUS

## 2019-04-05 MED ORDER — FENTANYL CITRATE (PF) 250 MCG/5ML IJ SOLN
INTRAMUSCULAR | Status: AC
  Filled 2019-04-05: qty 5

## 2019-04-05 MED ORDER — THROMBIN 20000 UNITS EX SOLR
CUTANEOUS | Status: DC | PRN
  Administered 2019-04-05: 20 mL via TOPICAL

## 2019-04-05 MED ORDER — SODIUM CHLORIDE 0.9 % IV SOLN
INTRAVENOUS | Status: DC | PRN
  Administered 2019-04-05: 13:00:00 20 ug/min via INTRAVENOUS

## 2019-04-05 MED ORDER — HYDROMORPHONE HCL 1 MG/ML IJ SOLN
0.5000 mg | INTRAMUSCULAR | Status: DC | PRN
  Administered 2019-04-05 – 2019-04-11 (×17): 0.5 mg via INTRAVENOUS
  Filled 2019-04-05 (×17): qty 1

## 2019-04-05 MED ORDER — GLYCOPYRROLATE PF 0.2 MG/ML IJ SOSY
PREFILLED_SYRINGE | INTRAMUSCULAR | Status: DC | PRN
  Administered 2019-04-05: 0.4 mg via INTRAVENOUS

## 2019-04-05 MED ORDER — THROMBIN 5000 UNITS EX SOLR
OROMUCOSAL | Status: DC | PRN
  Administered 2019-04-05 (×2): 5 mL via TOPICAL

## 2019-04-05 MED ORDER — ONDANSETRON HCL 4 MG/2ML IJ SOLN
INTRAMUSCULAR | Status: DC | PRN
  Administered 2019-04-05: 4 mg via INTRAVENOUS

## 2019-04-05 MED ORDER — PROPOFOL 10 MG/ML IV BOLUS
INTRAVENOUS | Status: AC
  Filled 2019-04-05: qty 20

## 2019-04-05 MED ORDER — MEPERIDINE HCL 25 MG/ML IJ SOLN: 6.2500 mg | INTRAMUSCULAR | Status: DC | PRN

## 2019-04-05 MED ORDER — 0.9 % SODIUM CHLORIDE (POUR BTL) OPTIME
TOPICAL | Status: DC | PRN
  Administered 2019-04-05: 12:00:00 1000 mL

## 2019-04-05 MED ORDER — HYDROMORPHONE HCL 1 MG/ML IJ SOLN
INTRAMUSCULAR | Status: AC
  Filled 2019-04-05: qty 2

## 2019-04-05 MED ORDER — CYCLOBENZAPRINE HCL 10 MG PO TABS
10.0000 mg | ORAL_TABLET | Freq: Three times a day (TID) | ORAL | Status: DC
  Administered 2019-04-05 – 2019-04-13 (×24): 10 mg via ORAL
  Filled 2019-04-05 (×24): qty 1

## 2019-04-05 MED ORDER — EPHEDRINE 5 MG/ML INJ
INTRAVENOUS | Status: AC
  Filled 2019-04-05: qty 10

## 2019-04-05 MED ORDER — GLYCOPYRROLATE PF 0.2 MG/ML IJ SOSY
PREFILLED_SYRINGE | INTRAMUSCULAR | Status: AC
  Filled 2019-04-05: qty 1

## 2019-04-05 MED ORDER — NITROGLYCERIN 0.2 MG/ML ON CALL CATH LAB
INTRAVENOUS | Status: DC | PRN
  Administered 2019-04-05: 40 ug via INTRAVENOUS

## 2019-04-05 MED ORDER — NEOSTIGMINE METHYLSULFATE 3 MG/3ML IV SOSY
PREFILLED_SYRINGE | INTRAVENOUS | Status: DC | PRN
  Administered 2019-04-05: 3 mg via INTRAVENOUS

## 2019-04-05 MED ORDER — THROMBIN 5000 UNITS EX SOLR
CUTANEOUS | Status: AC
  Filled 2019-04-05: qty 5000

## 2019-04-05 MED ORDER — PROMETHAZINE HCL 25 MG/ML IJ SOLN: 6.2500 mg | INTRAMUSCULAR | Status: DC | PRN

## 2019-04-05 MED ORDER — ARTIFICIAL TEARS OPHTHALMIC OINT
TOPICAL_OINTMENT | OPHTHALMIC | Status: AC
  Filled 2019-04-05: qty 3.5

## 2019-04-05 MED ORDER — LABETALOL HCL 5 MG/ML IV SOLN
10.0000 mg | INTRAVENOUS | Status: DC | PRN
  Administered 2019-04-05: 5 mg via INTRAVENOUS

## 2019-04-05 MED ORDER — LABETALOL HCL 5 MG/ML IV SOLN
10.0000 mg | INTRAVENOUS | Status: AC | PRN
  Administered 2019-04-05 (×2): 5 mg via INTRAVENOUS

## 2019-04-05 MED ORDER — MIDAZOLAM HCL 2 MG/2ML IJ SOLN
INTRAMUSCULAR | Status: AC
  Filled 2019-04-05: qty 2

## 2019-04-05 MED ORDER — DEXAMETHASONE SODIUM PHOSPHATE 4 MG/ML IJ SOLN
INTRAMUSCULAR | Status: DC | PRN
  Administered 2019-04-05: 10 mg via INTRAVENOUS

## 2019-04-05 MED ORDER — ONDANSETRON HCL 4 MG/2ML IJ SOLN
INTRAMUSCULAR | Status: AC
  Filled 2019-04-05: qty 2

## 2019-04-05 MED ORDER — SODIUM CHLORIDE 0.9 % IV SOLN
INTRAVENOUS | Status: DC | PRN
  Administered 2019-04-05: 12:00:00 500 mL

## 2019-04-05 MED ORDER — THROMBIN 20000 UNITS EX SOLR
CUTANEOUS | Status: AC
  Filled 2019-04-05: qty 20000

## 2019-04-05 MED ORDER — LIDOCAINE HCL (CARDIAC) PF 100 MG/5ML IV SOSY
PREFILLED_SYRINGE | INTRAVENOUS | Status: DC | PRN
  Administered 2019-04-05: 100 mg via INTRAVENOUS

## 2019-04-05 MED ORDER — BACITRACIN ZINC 500 UNIT/GM EX OINT
TOPICAL_OINTMENT | CUTANEOUS | Status: AC
  Filled 2019-04-05: qty 28.35

## 2019-04-05 SURGICAL SUPPLY — 78 items
BAG DECANTER FOR FLEXI CONT (MISCELLANEOUS) ×3 IMPLANT
BENZOIN TINCTURE PRP APPL 2/3 (GAUZE/BANDAGES/DRESSINGS) ×6 IMPLANT
BIT DRILL 13 (BIT) ×4 IMPLANT
BIT DRILL 13MM (BIT) ×2
BIT DRILL 2.4 (BIT) ×2 IMPLANT
BIT DRILL 2.4MM (BIT) ×1
BUR MATCHSTICK NEURO 3.0 LAGG (BURR) ×6 IMPLANT
CANISTER SUCT 3000ML PPV (MISCELLANEOUS) ×3 IMPLANT
CARTRIDGE OIL MAESTRO DRILL (MISCELLANEOUS) ×1 IMPLANT
CATH COUDE FOLEY 5CC 14FR (CATHETERS) ×3 IMPLANT
CLOSURE STERI-STRIP 1/2X4 (GAUZE/BANDAGES/DRESSINGS) ×1
CLOSURE WOUND 1/2 X4 (GAUZE/BANDAGES/DRESSINGS) ×1
CLSR STERI-STRIP ANTIMIC 1/2X4 (GAUZE/BANDAGES/DRESSINGS) ×2 IMPLANT
COVER WAND RF STERILE (DRAPES) ×3 IMPLANT
DERMABOND ADVANCED (GAUZE/BANDAGES/DRESSINGS) ×2
DERMABOND ADVANCED .7 DNX12 (GAUZE/BANDAGES/DRESSINGS) ×1 IMPLANT
DIFFUSER DRILL AIR PNEUMATIC (MISCELLANEOUS) ×3 IMPLANT
DRAPE C-ARM 42X72 X-RAY (DRAPES) ×12 IMPLANT
DRAPE LAPAROTOMY 100X72 PEDS (DRAPES) ×6 IMPLANT
DRAPE MICROSCOPE LEICA (MISCELLANEOUS) ×3 IMPLANT
DRSG OPSITE POSTOP 4X6 (GAUZE/BANDAGES/DRESSINGS) ×3 IMPLANT
DURAPREP 6ML APPLICATOR 50/CS (WOUND CARE) ×6 IMPLANT
ELECT COATED BLADE 2.86 ST (ELECTRODE) ×3 IMPLANT
ELECT REM PT RETURN 9FT ADLT (ELECTROSURGICAL) ×6
ELECTRODE REM PT RTRN 9FT ADLT (ELECTROSURGICAL) ×2 IMPLANT
GAUZE 4X4 16PLY RFD (DISPOSABLE) IMPLANT
GAUZE SPONGE 4X4 12PLY STRL (GAUZE/BANDAGES/DRESSINGS) IMPLANT
GAUZE SPONGE 4X4 12PLY STRL LF (GAUZE/BANDAGES/DRESSINGS) ×3 IMPLANT
GLOVE BIO SURGEON STRL SZ 6.5 (GLOVE) ×6 IMPLANT
GLOVE BIO SURGEONS STRL SZ 6.5 (GLOVE) ×3
GLOVE BIOGEL PI IND STRL 6.5 (GLOVE) ×2 IMPLANT
GLOVE BIOGEL PI IND STRL 7.0 (GLOVE) ×2 IMPLANT
GLOVE BIOGEL PI INDICATOR 6.5 (GLOVE) ×4
GLOVE BIOGEL PI INDICATOR 7.0 (GLOVE) ×4
GLOVE ECLIPSE 9.0 STRL (GLOVE) ×6 IMPLANT
GLOVE EXAM NITRILE XL STR (GLOVE) IMPLANT
GLOVE SURG SS PI 7.5 STRL IVOR (GLOVE) ×12 IMPLANT
GOWN STRL REUS W/ TWL LRG LVL3 (GOWN DISPOSABLE) ×4 IMPLANT
GOWN STRL REUS W/ TWL XL LVL3 (GOWN DISPOSABLE) ×2 IMPLANT
GOWN STRL REUS W/TWL 2XL LVL3 (GOWN DISPOSABLE) IMPLANT
GOWN STRL REUS W/TWL LRG LVL3 (GOWN DISPOSABLE) ×8
GOWN STRL REUS W/TWL XL LVL3 (GOWN DISPOSABLE) ×4
HALTER HD/CHIN CERV TRACTION D (MISCELLANEOUS) ×3 IMPLANT
HEMOSTAT POWDER KIT SURGIFOAM (HEMOSTASIS) ×6 IMPLANT
KIT BASIN OR (CUSTOM PROCEDURE TRAY) ×6 IMPLANT
KIT TURNOVER KIT B (KITS) ×3 IMPLANT
NEEDLE SPNL 20GX3.5 QUINCKE YW (NEEDLE) ×3 IMPLANT
NS IRRIG 1000ML POUR BTL (IV SOLUTION) ×3 IMPLANT
OIL CARTRIDGE MAESTRO DRILL (MISCELLANEOUS) ×3
PACK LAMINECTOMY NEURO (CUSTOM PROCEDURE TRAY) ×6 IMPLANT
PAD ARMBOARD 7.5X6 YLW CONV (MISCELLANEOUS) ×12 IMPLANT
PEEK ANATOMIC STRUT 5X14X11MM (Peek) ×3 IMPLANT
PEEK CAGE 8X14X11 (Peek) ×3 IMPLANT
PLATE ELITE 42MM (Plate) ×3 IMPLANT
ROD PRECUT 3.5X50 (Rod) ×6 IMPLANT
RUBBERBAND STERILE (MISCELLANEOUS) ×6 IMPLANT
SCREW MULTI AXIAL 3.5X14MM (Screw) ×15 IMPLANT
SCREW MULTI AXIAL 3.5X18MM (Screw) ×6 IMPLANT
SCREW SET 3600315 STANDARD (Screw) ×21 IMPLANT
SCREW SET 3600315 STD (Screw) ×7 IMPLANT
SCREW ST 15X4XST FXANG NS (Screw) ×4 IMPLANT
SCREW ST FIX 4 ATL (Screw) ×8 IMPLANT
SPACER SPINAL PEEK 14X11X14MM (Spacer) ×3 IMPLANT
SPONGE INTESTINAL PEANUT (DISPOSABLE) ×3 IMPLANT
SPONGE SURGIFOAM ABS GEL 100 (HEMOSTASIS) ×3 IMPLANT
STRIP CLOSURE SKIN 1/2X4 (GAUZE/BANDAGES/DRESSINGS) ×2 IMPLANT
SUT VIC AB 0 CT1 18XCR BRD8 (SUTURE) ×1 IMPLANT
SUT VIC AB 0 CT1 8-18 (SUTURE) ×2
SUT VIC AB 2-0 CT1 18 (SUTURE) ×3 IMPLANT
SUT VIC AB 4-0 RB1 18 (SUTURE) ×3 IMPLANT
TAPE CLOTH 4X10 WHT NS (GAUZE/BANDAGES/DRESSINGS) ×6 IMPLANT
TAPE CLOTH SURG 4X10 WHT LF (GAUZE/BANDAGES/DRESSINGS) ×3 IMPLANT
TOWEL GREEN STERILE (TOWEL DISPOSABLE) ×6 IMPLANT
TOWEL GREEN STERILE FF (TOWEL DISPOSABLE) ×6 IMPLANT
TRAP SPECIMEN MUCOUS 40CC (MISCELLANEOUS) ×3 IMPLANT
TRAY FOLEY W/BAG SLVR 16FR (SET/KITS/TRAYS/PACK) ×2
TRAY FOLEY W/BAG SLVR 16FR ST (SET/KITS/TRAYS/PACK) ×1 IMPLANT
WATER STERILE IRR 1000ML POUR (IV SOLUTION) ×3 IMPLANT

## 2019-04-05 NOTE — Progress Notes (Signed)
Attempted to see patient twice today.  Once around 11 AM and then around 4:15 PM.  Patient was in the OR throughout the day for extensive surgery.  Will attempt an hour again and if not available then will reassess tomorrow.

## 2019-04-05 NOTE — Anesthesia Procedure Notes (Addendum)
Procedure Name: Intubation Date/Time: 04/05/2019 11:02 AM Performed by: Myna Bright, CRNA Pre-anesthesia Checklist: Patient identified, Emergency Drugs available, Suction available and Patient being monitored Patient Re-evaluated:Patient Re-evaluated prior to induction Oxygen Delivery Method: Circle System Utilized Preoxygenation: Pre-oxygenation with 100% oxygen Induction Type: IV induction Ventilation: Mask ventilation without difficulty Laryngoscope Size: Glidescope and 4 Grade View: Grade I Tube type: Oral Tube size: 7.5 mm Number of attempts: 1 Airway Equipment and Method: Oral airway and Rigid stylet Placement Confirmation: ETT inserted through vocal cords under direct vision,  positive ETCO2 and breath sounds checked- equal and bilateral Secured at: 21 cm Tube secured with: Tape Dental Injury: Teeth and Oropharynx as per pre-operative assessment  Comments: VL glidescope electively chosen r/t presence of C collar and cervical instability. Intubation by A Fishback, SRNA. Neutral C spine position maintained throughout induction and intubationn

## 2019-04-05 NOTE — Anesthesia Preprocedure Evaluation (Addendum)
Anesthesia Evaluation  Patient identified by MRN, date of birth, ID band Patient awake    Reviewed: Allergy & Precautions, NPO status , Patient's Chart, lab work & pertinent test results  Airway Mallampati: II  TM Distance: >3 FB Neck ROM: Limited    Dental  (+) Dental Advisory Given, Poor Dentition, Missing   Pulmonary neg pulmonary ROS, Current Smoker,    Pulmonary exam normal breath sounds clear to auscultation       Cardiovascular hypertension, +CHF  Normal cardiovascular exam Rhythm:Regular Rate:Normal  Echo 04/03/2019  1. The left ventricle has normal systolic function with an ejection fraction of 60-65%. The cavity size was normal. There is moderately increased left ventricular wall thickness. Left ventricular diastolic Doppler parameters are consistent with pseudonormalization. Elevated left atrial and left ventricular end-diastolic pressures The E/e' is >20. No evidence of left ventricular regional wall motion abnormalities.  2. The right ventricle has normal systolic function. The cavity was normal. There is no increase in right ventricular wall thickness.  3. The mitral valve is grossly normal. Mild calcification of the mitral valve leaflet. There is moderate mitral annular calcification present.  4. The tricuspid valve is grossly normal.  5. The aortic valve is abnormal. Mild calcification of the aortic valve. Mild stenosis of the aortic valve.  6. The aorta is normal unless otherwise noted.  7. The inferior vena cava was normal in size with <50% respiratory variability.   Neuro/Psych  Neuromuscular disease negative psych ROS   GI/Hepatic negative GI ROS, (+) Cirrhosis       ,   Endo/Other  negative endocrine ROS  Renal/GU Renal disease     Musculoskeletal negative musculoskeletal ROS (+)   Abdominal   Peds  Hematology negative hematology ROS (+)   Anesthesia Other Findings   Reproductive/Obstetrics                            Anesthesia Physical Anesthesia Plan  ASA: III  Anesthesia Plan: General   Post-op Pain Management:    Induction: Intravenous  PONV Risk Score and Plan: 2 and Ondansetron, Dexamethasone, Treatment may vary due to age or medical condition and Midazolam  Airway Management Planned: Oral ETT and Video Laryngoscope Planned  Additional Equipment: Arterial line  Intra-op Plan:   Post-operative Plan: Possible Post-op intubation/ventilation  Informed Consent: I have reviewed the patients History and Physical, chart, labs and discussed the procedure including the risks, benefits and alternatives for the proposed anesthesia with the patient or authorized representative who has indicated his/her understanding and acceptance.     Dental advisory given  Plan Discussed with: CRNA  Anesthesia Plan Comments:         Anesthesia Quick Evaluation

## 2019-04-05 NOTE — Progress Notes (Signed)
Interpreter contacted as patient began shouting.  Patient continues shouting "help, help".  Patient reports he is choking and cannot swallow.  Mario [name] with NS called, he will contact Dr. Annette Stable or Jinny Blossom to advise.

## 2019-04-05 NOTE — Anesthesia Procedure Notes (Signed)
Arterial Line Insertion Start/End9/08/2018 11:00 AM, 04/05/2019 11:05 AM Performed by: Myna Bright, CRNA, CRNA  Patient location: Pre-op. Preanesthetic checklist: patient identified, IV checked, site marked, risks and benefits discussed, surgical consent, monitors and equipment checked, pre-op evaluation, timeout performed and anesthesia consent Lidocaine 1% used for infiltration radial was placed Catheter size: 20 G Hand hygiene performed  and maximum sterile barriers used   Attempts: 1 Procedure performed without using ultrasound guided technique. Following insertion, dressing applied and Biopatch. Post procedure assessment: normal and unchanged  Patient tolerated the procedure well with no immediate complications. Additional procedure comments: Inserted by A Fishback, SRNA.

## 2019-04-05 NOTE — Anesthesia Postprocedure Evaluation (Signed)
Anesthesia Post Note  Patient: Mario [name]  Procedure(s) Performed: ANTERIOR CERVICAL CORPECTOMY Cervical six and Cervical seven with strut graft fusion from Cervical five-Thoracic one    (N/A Spine Cervical) Posterior cervical fusion utilizing segmental instrumentation from Cervical five-Thoracic one  (N/A Spine Cervical)     Patient location during evaluation: PACU Anesthesia Type: General Level of consciousness: awake and alert Pain management: pain level controlled Vital Signs Assessment: post-procedure vital signs reviewed and stable Respiratory status: spontaneous breathing, nonlabored ventilation, respiratory function stable and patient connected to nasal cannula oxygen Cardiovascular status: blood pressure returned to baseline and stable Postop Assessment: no apparent nausea or vomiting Anesthetic complications: no    Last Vitals:  Vitals:   04/05/19 1615 04/05/19 1632  BP: (!) 143/72 (!) 147/73  Pulse:  61  Resp:  16  Temp: 36.4 C 36.4 C  SpO2:  93%    Last Pain:  Vitals:   04/05/19 1632  TempSrc: Oral  PainSc:                  Effie Berkshire

## 2019-04-05 NOTE — Transfer of Care (Signed)
Immediate Anesthesia Transfer of Care Note  Patient: Mario [name]  Procedure(s) Performed: ANTERIOR CERVICAL CORPECTOMY Cervical six and Cervical seven with strut graft fusion from Cervical five-Thoracic one    (N/A Spine Cervical) Posterior cervical fusion utilizing segmental instrumentation from Cervical five-Thoracic one  (N/A Spine Cervical)  Patient Location: PACU  Anesthesia Type:General  Level of Consciousness: sedated and responds to stimulation  Airway & Oxygen Therapy: Patient Spontanous Breathing and Patient connected to face mask oxygen  Post-op Assessment: Report given to RN, Post -op Vital signs reviewed and stable and Patient moving all extremities  Post vital signs: Reviewed and stable  Last Vitals:  Vitals Value Taken Time  BP    Temp    Pulse    Resp    SpO2      Last Pain:  Vitals:   04/05/19 0818  TempSrc: Oral  PainSc: 0-No pain      Patients Stated Pain Goal: 2 (91/63/84 [id number])  Complications: No apparent anesthesia complications

## 2019-04-05 NOTE — Op Note (Signed)
Date of procedure: 04/05/2019  Date of dictation: Same.  Service: Neurosurgery  Preoperative diagnosis: C6, C7 pathological fracture secondary to renal osteodystrophy with myelopathy  Postoperative diagnosis: Same  Procedure Name: Phase 2: Posterior cervical fusion C5-C7 utilizing segmental lateral mass and pedicle fixation with autograft.  Surgeon:Shaiden Aldous A.Pranit Owensby, M.D.  Asst. Surgeon: Reinaldo Meeker, NP  Anesthesia: General  Indication: 48 year old male status post C6 and C7 anterior cervical decompression and fusion by means of C6 and C7 corpectomies with strut graft fusion from C5-C7.  Patient presents now for the second phase of his operation with posterior cervical fixation.  Operative note: Patient is positioned prone onto bolsters with his head fixed in a neutral head position using a Mayfield pin headrest.  Posterior cervical region prepped and draped sterilely.  Incision made from C5-T1.  Dissection performed bilaterally.  Retractor placed.  Fluoroscopy used.  Levels confirmed.  Joint spaces at C5-6, C6-7 and C7-T1 were all identified.  Pilot holes were then made in the articular mass of C5-C6 and C7 for placement of lateral mass screws.  This was done bilaterally.  Using intraoperative fluoroscopic guidance and surface landmarks entry sites for the T1 pedicles were ascertained.  Pilot holes are drilled overlying the pedicle.  Pedicle was then drilled using a drill guide.  The drill guide hole was probed and found to be solidly within the bone.  18 mm screws were placed bilaterally at T1 with good purchase.  14 mm screws were placed bilaterally at C5, C6 and C7.  The C6 screw on the right side tore out and was removed.  I do not see any place to place to salvage her.  Short segment titanium rod placed over the screw heads from C5-T1.  Locking caps placed over the screw.  Locking caps then engaged with a construct under mild compression.  Final images reveal good position of the anterior and  posterior instrumentation with normal alignment of spine.  Previous we placed anterior cage was also stable.  Wound is then irrigated one final time.  Lamina and articular masses and facet joints were decorticated using high-speed drill.  Morselized autograft safe from the corpectomy and also from partial resection of the spinous processes was then used bilaterally.  Wound is then closed in layers of Vicryl sutures.  Steri-Strips and sterile dressing were applied.  No apparent complications.  Patient tolerated procedure well and he returns to the recovery room postop.

## 2019-04-05 NOTE — Brief Op Note (Signed)
04/05/2019  2:36 PM  PATIENT:  Mario [name]  48 y.o. male  PRE-OPERATIVE DIAGNOSIS:  Cervical stenosis with myelopathy  POST-OPERATIVE DIAGNOSIS:  Cervical stenosis with myelopathy  PROCEDURE:  Procedure(s): ANTERIOR CERVICAL CORPECTOMY Cervical six and Cervical seven with strut graft fusion from Cervical five-Thoracic one    (N/A) Posterior cervical fusion utilizing segmental instrumentation from Cervical five-Thoracic one  (N/A)  SURGEON:  Surgeon(s) and Role:    Earnie Larsson, MD - Primary  PHYSICIAN ASSISTANT:   ASSISTANTSMearl Latin   ANESTHESIA:   general  EBL:  200 mL   BLOOD ADMINISTERED:none  DRAINS: none   LOCAL MEDICATIONS USED:  MARCAINE     SPECIMEN:  No Specimen  DISPOSITION OF SPECIMEN:  N/A  COUNTS:  YES  TOURNIQUET:  * No tourniquets in log *  DICTATION: .Dragon Dictation  PLAN OF CARE: Admit to inpatient   PATIENT DISPOSITION:  PACU - hemodynamically stable.   Delay start of Pharmacological VTE agent (>24hrs) due to surgical blood loss or risk of bleeding: yes

## 2019-04-05 NOTE — Progress Notes (Signed)
[facility] Progress Note   Subjective:   Patient seen and examined in pt's room, getting ready to go for surgery.   Objective Vitals:   04/05/19 1504 04/05/19 1519 04/05/19 1534 04/05/19 1549  BP: (!) 192/84 (!) 206/105 (!) 188/105 (!) 193/101  Pulse: 85 87 85 86  Resp: 12 12 13  (!) 3  Temp:      TempSrc:      SpO2: 94% 96% 96% 93%  Weight:      Height:       Physical Exam General:NAD, chronically ill appearing male, laying in bed Heart:RRRc Lungs:CTAB Abdomen:soft, NTND Extremities:no LE edema Dialysis Access: LU AVF cannulated   Filed Weights   04/04/19 1347 04/04/19 1738 04/05/19 0953  Weight: 66.6 kg 64.6 kg 64.6 kg    Intake/Output Summary (Last 24 hours) at 04/05/2019 1557 Last data filed at 04/05/2019 1448 Gross per 24 hour  Intake 840 ml  Output 2450 ml  Net -1610 ml    Additional Objective Labs: Basic Metabolic Panel: Recent Labs  Lab 04/01/19 1024 04/02/19 0405 04/03/19 0652 04/04/19 1313 04/05/19 0958  NA 132*  --  135 133* 136  K 4.2  --  4.7 5.3* 4.2  CL 92*  --  97* 95*  --   CO2 25  --  23 21*  --   GLUCOSE 161*  --  144* 120* 108*  BUN 31*  --  25* 45*  --   CREATININE 7.36*  --  6.04* 7.82*  --   CALCIUM 9.1  --  8.9 8.5*  --   PHOS  --  7.8*  --  6.5*  --    Liver Function Tests: Recent Labs  Lab 04/03/19 0652 04/04/19 1313  AST 24  --   ALT 18  --   ALKPHOS 201*  --   BILITOT 0.9  --   PROT 6.6  --   ALBUMIN 3.2* 3.7   No results for input(s): LIPASE, AMYLASE in the last 168 hours. CBC: Recent Labs  Lab 04/01/19 1024 04/03/19 0652 04/04/19 0733 04/05/19 0958  WBC 5.4 3.1* 6.3  --   HGB 12.5* 13.2 13.4 13.9  HCT 38.6* 40.0 41.2 41.0  MCV 98.7 97.8 98.8  --   PLT 146* 128* 127*  --    Blood Culture    Component Value Date/Time   SDES  05/03/2018 1345    PLEURAL Performed at [facility], [address] [place], [place] [address]    [facility]  05/03/2018 1345    NONE Performed at  [facility], [address] [place], [place] [address]    CULT  05/03/2018 1345    NO GROWTH 3 DAYS Performed at [facility], [name], [place], [place] [address]    REPTSTATUS 05/07/2018 FINAL 05/03/2018 1345    Cardiac Enzymes: No results for input(s): CKTOTAL, CKMB, CKMBINDEX, TROPONINI in the last 168 hours. CBG: Recent Labs  Lab 04/03/19 1648  GLUCAP 157*   Iron Studies: No results for input(s): IRON, TIBC, TRANSFERRIN, FERRITIN in the last 72 hours. Lab Results  Component Value Date   INR 1.2 04/03/2019   INR 1.17 01/22/2018   INR 1.19 02/25/2017   Studies/Results: Dg Cervical Spine 2-3 Views  Result Date: 04/05/2019 CLINICAL DATA:  Cervical fusion EXAM: CERVICAL SPINE - 2-3 VIEW; DG C-ARM 1-60 MIN COMPARISON:  None. FLUOROSCOPY TIME:  Radiation Exposure Index (as provided by the fluoroscopic device): Not available If the device does not  provide the exposure index: Fluoroscopy Time:  43 seconds Number of Acquired Images:  5 FINDINGS: Initial images demonstrate a surgical instrument anteriorly at what appears to be C6-7 at the area of vertebral collapse. Subsequent films demonstrate evidence of interbody fusion at C5-6 and C6-7 with anterior fixation. Subsequently posterior fixation elements are noted from C5-T1. IMPRESSION: Cervical fusion both anteriorly and posteriorly. Electronically Signed   By: Inez Catalina M.D.   On: 04/05/2019 14:53   Dg C-arm 1-60 Min  Result Date: 04/05/2019 CLINICAL DATA:  Cervical fusion EXAM: CERVICAL SPINE - 2-3 VIEW; DG C-ARM 1-60 MIN COMPARISON:  None. FLUOROSCOPY TIME:  Radiation Exposure Index (as provided by the fluoroscopic device): Not available If the device does not provide the exposure index: Fluoroscopy Time:  43 seconds Number of Acquired Images:  5 FINDINGS: Initial images demonstrate a surgical instrument anteriorly at what appears to be C6-7 at the area of vertebral collapse. Subsequent films demonstrate evidence  of interbody fusion at C5-6 and C6-7 with anterior fixation. Subsequently posterior fixation elements are noted from C5-T1. IMPRESSION: Cervical fusion both anteriorly and posteriorly. Electronically Signed   By: Inez Catalina M.D.   On: 04/05/2019 14:53    Medications: . [MAR Hold] sodium chloride    . sodium chloride    . acetaminophen     . [MAR Hold] amLODipine  2.5 mg Oral Daily  . [MAR Hold] Chlorhexidine Gluconate Cloth  6 each Topical Q0600  . [MAR Hold] dexamethasone (DECADRON) injection  4 mg Intravenous Q6H  . [MAR Hold] famotidine  20 mg Oral Daily  . [MAR Hold] heparin  5,000 Units Subcutaneous Q8H  . HYDROmorphone      . HYDROmorphone      . labetalol      . [MAR Hold] sodium chloride flush  3 mL Intravenous Q12H    Dialysis Orders: [facility]  HD TTS / [employer] 3h  2/ 2.5Ca    800/450   65kg   Hep none  L AVF No Heparin hectorol 51mcg qHD   Assessment/Plan: 1. Neck pain/cervical myelopathy - Neurosurgery consulting.  Plan surgery today 2.  ESRD -  On HD TTS.  HD was off sched Monday, next HD Wed then Sat.  3.  Hypertension/volume  - BP elevated.  CXR showed mild pulmonary edema pre HD.  Does not appear volume overloaded today, CTAB/no edema. UF 1- 2 L tomorrow.  4.  Anemia of CKD - Hgb 13.2. No indication for ESA. 5.  Secondary Hyperparathyroidism -  Ca at goal. Phos elevated. Continue binders. 6.  Nutrition - Renal diet w/fluid restrictions 7. Hx CHF - Echo ordered 8. Alcoholic Cirrhosis   Kelly Splinter, MD 04/05/2019, 3:59 PM

## 2019-04-05 NOTE — Op Note (Signed)
Date of procedure: 04/05/2019  Date of dictation: Same  Service: Neurosurgery  Preoperative diagnosis: C6 and C7 pathologic fractures with severe myelopathy  Postoperative diagnosis: Same  Procedure Name: C6 and C7 anterior cervical corpectomy, microdissection  C5-T1 anterior cervical fusion with interbody cage and locally harvested autograft  Surgeon:Dorothea Yow A.Catheleen Langhorne, M.D.  Asst. Surgeon: Reinaldo Meeker, NP  Anesthesia: General  Indication: 48 year old male with multiple medical problems including end-stage renal disease and subsequent renal osteodystrophy presents with pathologic fractures of C6 and C7 with marked kyphotic angulation and severe spinal stenosis with progressive cervical myelopathy.  Patient presents now for anterior posterior cervical decompression and fusion in hopes of improving his symptoms.  Operative note: After induction of anesthesia, patient position supine with neck extended and held in place with halter traction.  Patient's anterior cervical region prepped and draped sterilely.  Incision made overlying the C6-7 level.  This carried down sharply to the platysma.  Platysma was then divided vertically and dissection proceeds along the medial border of the sternocleidomastoid muscle and carotid sheath.  Trachea and esophagus were mobilized and retracted toward the left.  Prevertebral fascia stripped off the anterior spinal column.  C5-6 and C7-T1 interspaces were identified using fluoroscopy.  The spaces were incised.  Self-retaining retractor was placed.  Discectomy was then performed using various instruments down to level the posterior annulus at both C5-6 and C7-T1.  Microscope was brought into the field using microdissection of the spinal canal.  The bodies of C6 and C7 were then removed using Leksell rongeurs Kerrison rongeurs and the high-speed drill.  The entire anterior body of C6 and C7 were resected.  The posterior logical limb was elevated and resected.  Underlying thecal  sac was identified.  A wide central decompression was then performed by undercutting the disc spaces at C5-6 and at C7-T1.  Decompression then proceeded laterally to the nerve root takeoffs bilaterally.  At this point a very thorough decompression had been achieved.  There was no evidence of injury to the thecal sac or nerve roots.  [employer] stackable interbody peek cages were assembled 28 mm in height.  This was packed with locally harvested autograft.  This was then impacted into place and recessed slightly from the anterior cortical margin.  42 mm Atlantis anterior cervical plate was then placed over the C5 and T1 levels.  This then attached under fluoroscopic guidance using 15 mm fixed angle screws to each at both C5 and C7.  All 4 screws given a final tightening found to be solidly within the bone.  Locking screws were engaged at both levels.  Final images reveal good position of the cages and the hardware at the proper upper level with normal alignment of the spine.  Wound is then irrigated one final time.  Wound is inspected for hemostasis and then closed in typical fashion.  Steri-Strips and sterile dressing were applied.  No apparent complications.  Patient tolerated the procedure well and he remains in the operating room where he prepares to undergo the second stage of the procedure.

## 2019-04-06 LAB — TROPONIN I (HIGH SENSITIVITY)
Troponin I (High Sensitivity): 30 ng/L — ABNORMAL HIGH (ref <18)
Troponin I (High Sensitivity): 28 ng/L — ABNORMAL HIGH (ref <18)

## 2019-04-06 LAB — CBC WITH DIFFERENTIAL/PLATELET
Abs Immature Granulocytes: 0.05 10*3/uL (ref 0.00–0.07)
Basophils Absolute: 0 10*3/uL (ref 0.0–0.1)
Basophils Relative: 0 %
Eosinophils Absolute: 0 10*3/uL (ref 0.0–0.5)
Eosinophils Relative: 0 %
HCT: 34.2 % — ABNORMAL LOW (ref 39.0–52.0)
Hemoglobin: 11.3 g/dL — ABNORMAL LOW (ref 13.0–17.0)
Immature Granulocytes: 1 %
Lymphocytes Relative: 6 %
Lymphs Abs: 0.4 10*3/uL — ABNORMAL LOW (ref 0.7–4.0)
MCH: 32.7 pg (ref 26.0–34.0)
MCHC: 33 g/dL (ref 30.0–36.0)
MCV: 98.8 fL (ref 80.0–100.0)
Monocytes Absolute: 0.5 10*3/uL (ref 0.1–1.0)
Monocytes Relative: 7 %
Neutro Abs: 5.8 10*3/uL (ref 1.7–7.7)
Neutrophils Relative %: 86 %
Platelets: 86 10*3/uL — ABNORMAL LOW (ref 150–400)
RBC: 3.46 MIL/uL — ABNORMAL LOW (ref 4.22–5.81)
RDW: 14.5 % (ref 11.5–15.5)
WBC: 6.8 10*3/uL (ref 4.0–10.5)
nRBC: 0 % (ref 0.0–0.2)

## 2019-04-06 LAB — BASIC METABOLIC PANEL
Anion gap: 18 — ABNORMAL HIGH (ref 5–15)
BUN: 60 mg/dL — ABNORMAL HIGH (ref 6–20)
CO2: 20 mmol/L — ABNORMAL LOW (ref 22–32)
Calcium: 7.3 mg/dL — ABNORMAL LOW (ref 8.9–10.3)
Chloride: 98 mmol/L (ref 98–111)
Creatinine, Ser: 7.55 mg/dL — ABNORMAL HIGH (ref 0.61–1.24)
GFR calc Af Amer: 9 mL/min — ABNORMAL LOW (ref >60)
GFR calc non Af Amer: 8 mL/min — ABNORMAL LOW (ref >60)
Glucose, Bld: 149 mg/dL — ABNORMAL HIGH (ref 70–99)
Potassium: 5 mmol/L (ref 3.5–5.1)
Sodium: 136 mmol/L (ref 135–145)

## 2019-04-06 MED ORDER — HYDRALAZINE HCL 20 MG/ML IJ SOLN
10.0000 mg | Freq: Four times a day (QID) | INTRAMUSCULAR | Status: DC | PRN
  Administered 2019-04-06 – 2019-04-09 (×3): 10 mg via INTRAVENOUS
  Filled 2019-04-06 (×3): qty 1

## 2019-04-06 MED ORDER — AMLODIPINE BESYLATE 5 MG PO TABS: 5.0000 mg | ORAL_TABLET | Freq: Every day | ORAL | Status: DC

## 2019-04-06 MED FILL — Thrombin For Soln 5000 Unit: CUTANEOUS | Qty: 5000 | Status: AC

## 2019-04-06 MED FILL — Gelatin Absorbable MT Powder: OROMUCOSAL | Qty: 1 | Status: AC

## 2019-04-06 NOTE — Progress Notes (Signed)
PROGRESS NOTE   Mario [name]  GGY:[id number]    DOB: Aug 11, 1970    DOA: 04/03/2019  PCP: [name], MD   I have briefly reviewed patients previous medical records in [facility].  Chief complaint Neck pain with radiation to arms and associated weakness.  Brief Narrative:  48 year old Spanish-speaking male, lives alone, independent, PMH of ESRD on TTS HD via LUE AVF, chronic combined systolic and diastolic CHF, LVEF 03% by TTE in 2017, mild mitral regurgitation, cirrhosis, HTN, initially admitted to [facility] on 04/01/2019 due to progressively worsening neck/upper back pain with radiation to bilateral upper extremity with associated tingling, numbness and upper extremity weakness, left> right.  CT cervical spine showed erosive changes compatible with renal spondyloarthropathy at C3-4, C6-7, C7-T1.  Patient was transferred to [facility] for Neurosurgical consultation.  Better after steroids and cervical collar.  Neurosurgery requested preop cardiology clearance which was provided by cardiology.  Patient was dialyzed before the surgery and then on 04/05/2019 he underwent anterior cervical corpectomy, microdissection, posterior cervical fusion.    Assessment & Plan:   Principal Problem:   Cervical radiculopathy Active Problems:   ESRD (end stage renal disease) on dialysis (HCC)   HTN (hypertension)   Neck pain   C6 and 7 pathological fracture with severe spinal stenosis with cord compression and cervical myelopathy due to renal osteodystrophy  CT cervical spine at OSH showed erosive changes compatible with renal spondyloarthropathy at C3-4, C6-7, C7-T1.  Continues to have cervical collar and dexamethasone 4 mg IV every 6 hourly.  Continue Pepcid. Postoperative day 1 status post anterior cervical corpectomy, microdissection, posterior cervical fusion.  Pain controlled.  Continue current pain medications.  Further management per neurosurgery.  Appreciate their help.   ESRD on TTS  HD  Had HD at OSH on 8/29.  Nephrology consulted for HD needs.  Will be on TTS schedule.  Secondary hyperparathyroidism  Management per nephrology.  Tobacco abuse  Cessation counseled.  Patient declines nicotine patch.  Chronic combined systolic and diastolic CHF  Last TTE 5/0/0938 showed LVEF 40% with hypokinesis of anteroseptal myocardium and grade 1 diastolic dysfunction.  Mild MR, moderate TR, moderate pulmonary hypertension.  Clinically euvolemic.  Volume management across HD.  Repeat TTE shows normal EF and no MR.  As stated above, requested preop cardiology clearance.  Elevated troponin  HS troponin mildly elevated but flat trend, 39 > 38 > 27  No anginal symptoms.  Likely related to ESRD.  Cirrhosis/splenomegaly  Noted on abdominal ultrasound 05/02/2018.  Had prior paracentesis 04/2018.  Quit alcohol intake 3 years ago.  Currently without clinical ascites.  INR 1.2 on 8/30.  Essential hypertension  Mildly uncontrolled.  Similar urgency resolved.  PRN IV hydralazine.  Thrombocytopenia  Appears chronic and has been dropping with 86 today.  No signs of bleeding.  Watch closely while he is on subcutaneous heparin DVT prophylaxis dose.  DVT prophylaxis: Subcutaneous heparin Code Status: Full Family Communication: None at bedside Disposition: To be determined pending clinical improvement   Consultants:  Neurosurgery Nephrology Cardiology  Procedures:  Cervical collar anterior cervical corpectomy, microdissection, posterior cervical fusion on 04/05/2019.  Antimicrobials:  None   Subjective: Patient seen and examined.  He was laying flat in the bed with neck collar on.  Complained of shoulder pain, anterior and posterior neck.  No other complaint.  Afebrile.  No problem breathing.  Objective:  Vitals:   04/06/19 0800 04/06/19 0918 04/06/19 1247 04/06/19 1312  BP:  (!) 156/87 (!) 189/77 Marland Kitchen)  208/89  Pulse:   69   Resp:   11   Temp: 97.6 F (36.4  C)  98 F (36.7 C)   TempSrc: Oral  Oral   SpO2: 100%  98%   Weight:      Height:        Examination:  General exam: Appears calm and comfortable with Aspen collar in neck, laying flat on the bed. Respiratory system: Diminished breath sounds with poor respiratory effort. Cardiovascular system: S1 & S2 heard, RRR. No JVD, murmurs, rubs, gallops or clicks. No pedal edema. Gastrointestinal system: Abdomen is nondistended, soft and nontender. No organomegaly or masses felt. Normal bowel sounds heard. Central nervous system: Alert and oriented.  Skin: No rashes, lesions or ulcers Psychiatry: Judgement and insight appear normal. Mood & affect appropriate.   Data Reviewed: I have personally reviewed following labs and imaging studies  CBC: Recent Labs  Lab 04/01/19 1024 04/03/19 0652 04/04/19 0733 04/05/19 0958 04/06/19 1106  WBC 5.4 3.1* 6.3  --  6.8  NEUTROABS  --   --   --   --  PENDING  HGB 12.5* 13.2 13.4 13.9 11.3*  HCT 38.6* 40.0 41.2 41.0 34.2*  MCV 98.7 97.8 98.8  --  98.8  PLT 146* 128* 127*  --  PENDING   Basic Metabolic Panel: Recent Labs  Lab 04/01/19 1024 04/02/19 0405 04/03/19 0652 04/04/19 1313 04/05/19 0958 04/06/19 1106  NA 132*  --  135 133* 136 136  K 4.2  --  4.7 5.3* 4.2 5.0  CL 92*  --  97* 95*  --  98  CO2 25  --  23 21*  --  20*  GLUCOSE 161*  --  144* 120* 108* 149*  BUN 31*  --  25* 45*  --  60*  CREATININE 7.36*  --  6.04* 7.82*  --  7.55*  CALCIUM 9.1  --  8.9 8.5*  --  7.3*  PHOS  --  7.8*  --  6.5*  --   --    Liver Function Tests: Recent Labs  Lab 04/03/19 0652 04/04/19 1313  AST 24  --   ALT 18  --   ALKPHOS 201*  --   BILITOT 0.9  --   PROT 6.6  --   ALBUMIN 3.2* 3.7    Cardiac Enzymes: No results for input(s): CKTOTAL, CKMB, CKMBINDEX, TROPONINI in the last 168 hours.  CBG: Recent Labs  Lab 04/03/19 1648  GLUCAP 157*    Recent Results (from the past 240 hour(s))  SARS Coronavirus 2 [facility] order, Performed in  [facility] hospital lab) Nasopharyngeal Nasopharyngeal Swab     Status: None   Collection Time: 04/01/19 11:15 AM   Specimen: Nasopharyngeal Swab  Result Value Ref Range Status   SARS Coronavirus 2 NEGATIVE NEGATIVE Final    Comment: (NOTE) If result is NEGATIVE SARS-CoV-2 target nucleic acids are NOT DETECTED. The SARS-CoV-2 RNA is generally detectable in upper and lower  respiratory specimens during the acute phase of infection. The lowest  concentration of SARS-CoV-2 viral copies this assay can detect is 250  copies / mL. A negative result does not preclude SARS-CoV-2 infection  and should not be used as the sole basis for treatment or other  patient management decisions.  A negative result may occur with  improper specimen collection / handling, submission of specimen other  than nasopharyngeal swab, presence of viral mutation(s) within the  areas targeted by this assay, and inadequate number of viral  copies  (<250 copies / mL). A negative result must be combined with clinical  observations, patient history, and epidemiological information. If result is POSITIVE SARS-CoV-2 target nucleic acids are DETECTED. The SARS-CoV-2 RNA is generally detectable in upper and lower  respiratory specimens dur ing the acute phase of infection.  Positive  results are indicative of active infection with SARS-CoV-2.  Clinical  correlation with patient history and other diagnostic information is  necessary to determine patient infection status.  Positive results do  not rule out bacterial infection or co-infection with other viruses. If result is PRESUMPTIVE POSTIVE SARS-CoV-2 nucleic acids MAY BE PRESENT.   A presumptive positive result was obtained on the submitted specimen  and confirmed on repeat testing.  While 2019 novel coronavirus  (SARS-CoV-2) nucleic acids may be present in the submitted sample  additional confirmatory testing may be necessary for epidemiological  and / or clinical  management purposes  to differentiate between  SARS-CoV-2 and other Sarbecovirus currently known to infect humans.  If clinically indicated additional testing with an alternate test  methodology [phone]) is advised. The SARS-CoV-2 RNA is generally  detectable in upper and lower respiratory sp ecimens during the acute  phase of infection. The expected result is Negative. Fact Sheet for Patients:  StrictlyIdeas.no Fact Sheet for Healthcare Providers: BankingDealers.co.za This test is not yet approved or cleared by the [place] FDA and has been authorized for detection and/or diagnosis of SARS-CoV-2 by FDA under an Emergency Use Authorization (EUA).  This EUA will remain in effect (meaning this test can be used) for the duration of the COVID-19 declaration under Section 564(b)(1) of the Act, 21 U.S.C. section 360bbb-3(b)(1), unless the authorization is terminated or revoked sooner. Performed at [facility], [address] [place], [place] [address]   MRSA PCR Screening     Status: None   Collection Time: 04/03/19  6:32 AM   Specimen: Nasal Mucosa; Nasopharyngeal  Result Value Ref Range Status   MRSA by PCR NEGATIVE NEGATIVE Final    Comment:        The GeneXpert MRSA Assay (FDA approved for NASAL specimens only), is one component of a comprehensive MRSA colonization surveillance program. It is not intended to diagnose MRSA infection nor to guide or monitor treatment for MRSA infections. Performed at [facility], [place] [address], [place], [place] [address]          Radiology Studies: Dg Cervical Spine 2-3 Views  Result Date: 04/05/2019 CLINICAL DATA:  Cervical fusion EXAM: CERVICAL SPINE - 2-3 VIEW; DG C-ARM 1-60 MIN COMPARISON:  None. FLUOROSCOPY TIME:  Radiation Exposure Index (as provided by the fluoroscopic device): Not available If the device does not provide the exposure index: Fluoroscopy Time:  43  seconds Number of Acquired Images:  5 FINDINGS: Initial images demonstrate a surgical instrument anteriorly at what appears to be C6-7 at the area of vertebral collapse. Subsequent films demonstrate evidence of interbody fusion at C5-6 and C6-7 with anterior fixation. Subsequently posterior fixation elements are noted from C5-T1. IMPRESSION: Cervical fusion both anteriorly and posteriorly. Electronically Signed   By: Inez Catalina M.D.   On: 04/05/2019 14:53   Dg C-arm 1-60 Min  Result Date: 04/05/2019 CLINICAL DATA:  Cervical fusion EXAM: CERVICAL SPINE - 2-3 VIEW; DG C-ARM 1-60 MIN COMPARISON:  None. FLUOROSCOPY TIME:  Radiation Exposure Index (as provided by the fluoroscopic device): Not available If the device does not provide the exposure index: Fluoroscopy Time:  43 seconds Number of  Acquired Images:  5 FINDINGS: Initial images demonstrate a surgical instrument anteriorly at what appears to be C6-7 at the area of vertebral collapse. Subsequent films demonstrate evidence of interbody fusion at C5-6 and C6-7 with anterior fixation. Subsequently posterior fixation elements are noted from C5-T1. IMPRESSION: Cervical fusion both anteriorly and posteriorly. Electronically Signed   By: Inez Catalina M.D.   On: 04/05/2019 14:53        Scheduled Meds: . [START ON 04/07/2019] amLODipine  5 mg Oral Daily  . Chlorhexidine Gluconate Cloth  6 each Topical Q0600  . Chlorhexidine Gluconate Cloth  6 each Topical Q0600  . cyclobenzaprine  10 mg Oral TID  . dexamethasone (DECADRON) injection  4 mg Intravenous Q6H  . famotidine  20 mg Oral Daily  . heparin  5,000 Units Subcutaneous Q8H  . sodium chloride flush  3 mL Intravenous Q12H   Continuous Infusions: . sodium chloride       LOS: 3 days     Darliss Cheney, MD,  [facility]  To contact the attending provider between 7A-7P or the covering provider during after hours 7P-7A, please log into the web site [email or web address] and access using universal  [place] password for that web site. If you do not have the password, please call the hospital operator.  04/06/2019, 1:52 PM

## 2019-04-06 NOTE — Discharge Summary (Signed)
[place] [facility] [place] NAME: Mario [name]    MR#:  [id number]  DATE OF BIRTH:  [birth year]  DATE OF ADMISSION:  04/01/2019   ADMITTING PHYSICIAN: Demetrios Loll, MD  DATE OF DISCHARGE: 04/03/2019  3:30 AM  PRIMARY CARE PHYSICIAN: [name], MD   ADMISSION DIAGNOSIS:  Acute pulmonary edema ([place]) [J81.0] Hypertensive urgency [I16.0] Closed displaced fracture of sixth cervical vertebra, unspecified fracture morphology, initial encounter ([place]) [T51.761Y] DISCHARGE DIAGNOSIS:  Active Problems:   Hypertensive urgency  SECONDARY DIAGNOSIS:   Past Medical History:  Diagnosis Date  . Abdominal pain 08/28/2016  . Acute hyperkalemia 10/28/2016  . Chest pain 04/11/2016  . Chronic combined systolic and diastolic CHF (congestive heart failure) ([place])   . Cirrhosis ([place])   . Dialysis patient ([place])   . ESRD (end stage renal disease) on dialysis ([place]) 01/25/2015  . Fluid overload 04/12/2016  . HTN (hypertension) 10/13/2016  . Hyperkalemia 11/27/2016  . Hypertension   . Mitral regurgitation   . Pulmonary edema 07/03/2016  . Renal disorder   . Renal insufficiency   . Scrotal infection 01/25/2015   HOSPITAL COURSE:  48 y.o. male with ESRD on HD TTS, liver cirrhosis, hypertension, chronic combined systolic and diastolic CHF ED with complaints of bilateral shoulder pain and numbness to both arms.  Patient report onset of symptoms since 10/2018 with progressive worsening in the last 3 weeks. He was seen in the ED on  02/22/2019 for left shoulder pain predominantly in the right scapular region. Patient state since discharge from the ED he has continued to have left shoulder pain now involving the right scapular region. Patient report new symptoms of numbness and weakness in both arm for the past 2 weeks. He is unable to hold or grip objects. Patient states he keeps dropping objects due to weakness.  On arrival to the ED, he was afebrile with blood pressure  204/68 mm Hg and pulse rate 73 beats/min. There were no focal neurological deficits.  1. Cervical Radiculopathy -  Patient presenting with neurological deficits numbness, altered reflexes, and weakness radiating  from the neck into bilateral shoulder, arm, hand, or fingers - CT cervical spine shows Marked progression of bony erosion at C6-7 and subluxation of the left C6-7 facet. - Applied cervical collar - PRN pain management - D/W [facility] who has accepted the patient and is being transferred  2. Hypertensive urgency - improved  3. ESRD on HD - TTS  4. Elevated troponin -likely due to demand ischemia from hypertensive crisis  DISCHARGE CONDITIONS:  fair CONSULTS OBTAINED:  Treatment Team:  Murlean Iba, MD DRUG ALLERGIES:   Allergies  Allergen Reactions  . Betadine [Povidone Iodine] Rash   DISCHARGE MEDICATIONS:   Allergies as of 04/03/2019      Reactions   Betadine [povidone Iodine] Rash      Medication List    ASK your doctor about these medications   Auryxia 1 GM 210 MG(Fe) tablet Generic drug: ferric citrate Take 420 mg by mouth 3 (three) times daily with meals.        DISCHARGE INSTRUCTIONS:   DIET:  Regular diet DISCHARGE CONDITION:  Fair ACTIVITY:  Activity as tolerated OXYGEN:  Home Oxygen: No.  Oxygen Delivery: room air DISCHARGE LOCATION:  [place] transfer for neurosurgery eval   If you experience worsening of your admission symptoms, develop shortness of breath, life threatening emergency, suicidal or homicidal thoughts you must seek medical attention immediately  by calling 911 or calling your MD immediately  if symptoms less severe.  You Must read complete instructions/literature along with all the possible adverse reactions/side effects for all the Medicines you take and that have been prescribed to you. Take any new Medicines after you have completely understood and accpet all the possible adverse reactions/side  effects.   Please note  You were cared for by a hospitalist during your hospital stay. If you have any questions about your discharge medications or the care you received while you were in the hospital after you are discharged, you can call the unit and asked to speak with the hospitalist on call if the hospitalist that took care of you is not available. Once you are discharged, your primary care physician will handle any further medical issues. Please note that NO REFILLS for any discharge medications will be authorized once you are discharged, as it is imperative that you return to your primary care physician (or establish a relationship with a primary care physician if you do not have one) for your aftercare needs so that they can reassess your need for medications and monitor your lab values.    On the day of Discharge:  VITAL SIGNS:  Blood pressure (!) 184/72, pulse 80, temperature 97.7 F (36.5 C), temperature source Oral, resp. rate 16, height 5\' 5"  (1.651 m), weight 65.7 kg, SpO2 100 %. PHYSICAL EXAMINATION:  GENERAL:  48 y.o.-year-old patient lying in the bed with no acute distress.  EYES: Pupils equal, round, reactive to light and accommodation. No scleral icterus. Extraocular muscles intact.  HEENT: Head atraumatic, normocephalic. Oropharynx and nasopharynx clear.  NECK:  Supple, no jugular venous distention. No thyroid enlargement, no tenderness.  LUNGS: Normal breath sounds bilaterally, no wheezing, rales,rhonchi or crepitation. No use of accessory muscles of respiration.  CARDIOVASCULAR: S1, S2 normal. No murmurs, rubs, or gallops.  ABDOMEN: Soft, non-tender, non-distended. Bowel sounds present. No organomegaly or mass.  EXTREMITIES: No pedal edema, cyanosis, or clubbing.  NEUROLOGIC: Cranial nerves II through XII are intact. Muscle strength 5/5 in all extremities. Sensation intact. Gait not checked.  PSYCHIATRIC: The patient is alert and oriented x 3.  SKIN: No obvious rash,  lesion, or ulcer.  DATA REVIEW:   CBC Recent Labs  Lab 04/04/19 0733 04/05/19 0958  WBC 6.3  --   HGB 13.4 13.9  HCT 41.2 41.0  PLT 127*  --     Chemistries  Recent Labs  Lab 04/03/19 0652  04/06/19 1106  NA 135   < > 136  K 4.7   < > 5.0  CL 97*   < > 98  CO2 23   < > 20*  GLUCOSE 144*   < > 149*  BUN 25*   < > 60*  CREATININE 6.04*   < > 7.55*  CALCIUM 8.9   < > 7.3*  AST 24  --   --   ALT 18  --   --   ALKPHOS 201*  --   --   BILITOT 0.9  --   --    < > = values in this interval not displayed.      RADIOLOGY:  Dg Cervical Spine 2-3 Views  Result Date: 04/05/2019 CLINICAL DATA:  Cervical fusion EXAM: CERVICAL SPINE - 2-3 VIEW; DG C-ARM 1-60 MIN COMPARISON:  None. FLUOROSCOPY TIME:  Radiation Exposure Index (as provided by the fluoroscopic device): Not available If the device does not provide the exposure index: Fluoroscopy Time:  43 seconds  Number of Acquired Images:  5 FINDINGS: Initial images demonstrate a surgical instrument anteriorly at what appears to be C6-7 at the area of vertebral collapse. Subsequent films demonstrate evidence of interbody fusion at C5-6 and C6-7 with anterior fixation. Subsequently posterior fixation elements are noted from C5-T1. IMPRESSION: Cervical fusion both anteriorly and posteriorly. Electronically Signed   By: Inez Catalina M.D.   On: 04/05/2019 14:53   Dg C-arm 1-60 Min  Result Date: 04/05/2019 CLINICAL DATA:  Cervical fusion EXAM: CERVICAL SPINE - 2-3 VIEW; DG C-ARM 1-60 MIN COMPARISON:  None. FLUOROSCOPY TIME:  Radiation Exposure Index (as provided by the fluoroscopic device): Not available If the device does not provide the exposure index: Fluoroscopy Time:  43 seconds Number of Acquired Images:  5 FINDINGS: Initial images demonstrate a surgical instrument anteriorly at what appears to be C6-7 at the area of vertebral collapse. Subsequent films demonstrate evidence of interbody fusion at C5-6 and C6-7 with anterior fixation.  Subsequently posterior fixation elements are noted from C5-T1. IMPRESSION: Cervical fusion both anteriorly and posteriorly. Electronically Signed   By: Inez Catalina M.D.   On: 04/05/2019 14:53     Management plans discussed with the patient, family and they are in agreement.  CODE STATUS: Full Code   TOTAL TIME TAKING CARE OF THIS PATIENT: 45 minutes.    Max Sane M.D on 04/06/2019 at 12:11 PM  Between 7am to 6pm - Pager - [phone]  After 6pm go to [email or web address] - Proofreader  [facility]  Office  [phone]  CC: Primary care physician; [name], MD   Note: This dictation was prepared with Dragon dictation along with smaller phrase technology. Any transcriptional errors that result from this process are unintentional.

## 2019-04-06 NOTE — Progress Notes (Addendum)
   Per discussion with Dr. Meda Coffee: [facility] will sign off.   Medication Recommendations: No acute changes  Other recommendations (labs, testing, etc):  No acute changes Follow up as an outpatient:  Since pt lives in [place], Dr. Meda Coffee recommends arranging f/u at that location - pt is scheduled 07/11/19 at 11am with Dr. Rockey Situ to establish care, appt on AVS (colleague helped translate for Spanish).  Dayna Dunn PA-C

## 2019-04-06 NOTE — Progress Notes (Signed)
Patient told interpretor that he needs to be back on Tues., Thurs, Sat., Hemodialysis schedule.  Changed his diet to Renal per [place] MD order. He is in pain today. Gave him Oxy, flexeril and tylenol to help with pain.

## 2019-04-06 NOTE — Progress Notes (Signed)
Postop day 1.  Overall patient doing well.  Complains of appropriate posterior cervical pain.  No radiating pain.  Upper and lower extremity symptoms improved.  He is afebrile.  His vital signs are stable.  He is awake and alert.  He is oriented and appropriate.  Motor and sensory function essentially intact.  Wound clean and dry.  Neck soft.  Swallowing reasonably well.  Overall doing well.  Mobilized with therapy today.

## 2019-04-06 NOTE — Progress Notes (Signed)
[facility] Progress Note   Subjective:   Patient seen in room, no new c/o.    Objective Vitals:   04/06/19 0100 04/06/19 0421 04/06/19 0800 04/06/19 0918  BP: (!) 171/64   (!) 156/87  Pulse: 64     Resp: 13     Temp:  97.8 F (36.6 C) 97.6 F (36.4 C)   TempSrc:  Oral Oral   SpO2:   100%   Weight:      Height:       Physical Exam General:NAD, chronically ill appearing male, laying in bed Heart:RRRc Lungs:CTAB Abdomen:soft, NTND Extremities:no LE edema Dialysis Access: LU AVF cannulated   Filed Weights   04/04/19 1347 04/04/19 1738 04/05/19 0953  Weight: 66.6 kg 64.6 kg 64.6 kg    Intake/Output Summary (Last 24 hours) at 04/06/2019 1228 Last data filed at 04/06/2019 0900 Gross per 24 hour  Intake 418 ml  Output 150 ml  Net 268 ml    Additional Objective Labs: Basic Metabolic Panel: Recent Labs  Lab 04/02/19 0405 04/03/19 0652 04/04/19 1313 04/05/19 0958 04/06/19 1106  NA  --  135 133* 136 136  K  --  4.7 5.3* 4.2 5.0  CL  --  97* 95*  --  98  CO2  --  23 21*  --  20*  GLUCOSE  --  144* 120* 108* 149*  BUN  --  25* 45*  --  60*  CREATININE  --  6.04* 7.82*  --  7.55*  CALCIUM  --  8.9 8.5*  --  7.3*  PHOS 7.8*  --  6.5*  --   --    Liver Function Tests: Recent Labs  Lab 04/03/19 0652 04/04/19 1313  AST 24  --   ALT 18  --   ALKPHOS 201*  --   BILITOT 0.9  --   PROT 6.6  --   ALBUMIN 3.2* 3.7   No results for input(s): LIPASE, AMYLASE in the last 168 hours. CBC: Recent Labs  Lab 04/01/19 1024 04/03/19 0652 04/04/19 0733 04/05/19 0958  WBC 5.4 3.1* 6.3  --   HGB 12.5* 13.2 13.4 13.9  HCT 38.6* 40.0 41.2 41.0  MCV 98.7 97.8 98.8  --   PLT 146* 128* 127*  --    Blood Culture    Component Value Date/Time   SDES  05/03/2018 1345    PLEURAL Performed at [facility], [address] [place], [place] [address]    [facility]  05/03/2018 1345    NONE Performed at [facility], [address]  [place], [place] [address]    CULT  05/03/2018 1345    NO GROWTH 3 DAYS Performed at [facility], [place] [address], [place], [place] [address]    REPTSTATUS 05/07/2018 FINAL 05/03/2018 1345    Cardiac Enzymes: No results for input(s): CKTOTAL, CKMB, CKMBINDEX, TROPONINI in the last 168 hours. CBG: Recent Labs  Lab 04/03/19 1648  GLUCAP 157*   Iron Studies: No results for input(s): IRON, TIBC, TRANSFERRIN, FERRITIN in the last 72 hours. Lab Results  Component Value Date   INR 1.2 04/03/2019   INR 1.17 01/22/2018   INR 1.19 02/25/2017   Studies/Results: Dg Cervical Spine 2-3 Views  Result Date: 04/05/2019 CLINICAL DATA:  Cervical fusion EXAM: CERVICAL SPINE - 2-3 VIEW; DG C-ARM 1-60 MIN COMPARISON:  None. FLUOROSCOPY TIME:  Radiation Exposure Index (as provided by the fluoroscopic device): Not available If the device does not provide the  exposure index: Fluoroscopy Time:  43 seconds Number of Acquired Images:  5 FINDINGS: Initial images demonstrate a surgical instrument anteriorly at what appears to be C6-7 at the area of vertebral collapse. Subsequent films demonstrate evidence of interbody fusion at C5-6 and C6-7 with anterior fixation. Subsequently posterior fixation elements are noted from C5-T1. IMPRESSION: Cervical fusion both anteriorly and posteriorly. Electronically Signed   By: Inez Catalina M.D.   On: 04/05/2019 14:53   Dg C-arm 1-60 Min  Result Date: 04/05/2019 CLINICAL DATA:  Cervical fusion EXAM: CERVICAL SPINE - 2-3 VIEW; DG C-ARM 1-60 MIN COMPARISON:  None. FLUOROSCOPY TIME:  Radiation Exposure Index (as provided by the fluoroscopic device): Not available If the device does not provide the exposure index: Fluoroscopy Time:  43 seconds Number of Acquired Images:  5 FINDINGS: Initial images demonstrate a surgical instrument anteriorly at what appears to be C6-7 at the area of vertebral collapse. Subsequent films demonstrate evidence of interbody fusion at C5-6 and C6-7 with  anterior fixation. Subsequently posterior fixation elements are noted from C5-T1. IMPRESSION: Cervical fusion both anteriorly and posteriorly. Electronically Signed   By: Inez Catalina M.D.   On: 04/05/2019 14:53    Medications: . sodium chloride     . [START ON 04/07/2019] amLODipine  5 mg Oral Daily  . Chlorhexidine Gluconate Cloth  6 each Topical Q0600  . Chlorhexidine Gluconate Cloth  6 each Topical Q0600  . cyclobenzaprine  10 mg Oral TID  . dexamethasone (DECADRON) injection  4 mg Intravenous Q6H  . famotidine  20 mg Oral Daily  . heparin  5,000 Units Subcutaneous Q8H  . sodium chloride flush  3 mL Intravenous Q12H    Dialysis Orders: [facility]  HD TTS / [employer] 3h  2/ 2.5Ca    800/450   65kg   Hep none  L AVF No Heparin hectorol 34mcg qHD   Assessment/Plan: 1. Neck pain/cervical myelopathy - SP post cervical fusion C5-C7 2.  ESRD -  On HD TTS.  Had HD Monday off schedule. Next HD tomorrow then Sat and TTS therafter.  3.  Hypertension/volume  - BP good. Does not appear volume overloaded today, CTAB/no edema. Small UF w/ HD tomorrow.  4.  Anemia of CKD - Hgb 13.2. No indication for ESA. 5.  Secondary Hyperparathyroidism -  Ca at goal. Phos elevated. Continue binders. 6.  Nutrition - Renal diet w/fluid restrictions 7. Hx CHF - Echo ordered 8. Alcoholic Cirrhosis   Kelly Splinter, MD 04/06/2019, 12:28 PM

## 2019-04-06 NOTE — Progress Notes (Signed)
Patient was having chest pain and telemetry called and said he was having ST elevation.  Dr. Vonzella Nipple troponin and 12 Lead EKG.  Niece called earlier and set up password "[email or web address]". I need to get patient to sign paper with interpretor tomorrow.

## 2019-04-07 LAB — CBC WITH DIFFERENTIAL/PLATELET
Abs Immature Granulocytes: 0.11 10*3/uL — ABNORMAL HIGH (ref 0.00–0.07)
Basophils Absolute: 0 10*3/uL (ref 0.0–0.1)
Basophils Relative: 0 %
Eosinophils Absolute: 0 10*3/uL (ref 0.0–0.5)
Eosinophils Relative: 0 %
HCT: 33.1 % — ABNORMAL LOW (ref 39.0–52.0)
Hemoglobin: 10.9 g/dL — ABNORMAL LOW (ref 13.0–17.0)
Immature Granulocytes: 2 %
Lymphocytes Relative: 7 %
Lymphs Abs: 0.5 10*3/uL — ABNORMAL LOW (ref 0.7–4.0)
MCH: 32.6 pg (ref 26.0–34.0)
MCHC: 32.9 g/dL (ref 30.0–36.0)
MCV: 99.1 fL (ref 80.0–100.0)
Monocytes Absolute: 0.6 10*3/uL (ref 0.1–1.0)
Monocytes Relative: 8 %
Neutro Abs: 5.7 10*3/uL (ref 1.7–7.7)
Neutrophils Relative %: 83 %
Platelets: 89 10*3/uL — ABNORMAL LOW (ref 150–400)
RBC: 3.34 MIL/uL — ABNORMAL LOW (ref 4.22–5.81)
RDW: 14.6 % (ref 11.5–15.5)
WBC: 6.9 10*3/uL (ref 4.0–10.5)
nRBC: 0 % (ref 0.0–0.2)

## 2019-04-07 LAB — COMPREHENSIVE METABOLIC PANEL
ALT: 12 U/L (ref 0–44)
AST: 24 U/L (ref 15–41)
Albumin: 3.1 g/dL — ABNORMAL LOW (ref 3.5–5.0)
Alkaline Phosphatase: 157 U/L — ABNORMAL HIGH (ref 38–126)
Anion gap: 17 — ABNORMAL HIGH (ref 5–15)
BUN: 72 mg/dL — ABNORMAL HIGH (ref 6–20)
CO2: 21 mmol/L — ABNORMAL LOW (ref 22–32)
Calcium: 7 mg/dL — ABNORMAL LOW (ref 8.9–10.3)
Chloride: 96 mmol/L — ABNORMAL LOW (ref 98–111)
Creatinine, Ser: 8.5 mg/dL — ABNORMAL HIGH (ref 0.61–1.24)
GFR calc Af Amer: 8 mL/min — ABNORMAL LOW (ref >60)
GFR calc non Af Amer: 7 mL/min — ABNORMAL LOW (ref >60)
Glucose, Bld: 140 mg/dL — ABNORMAL HIGH (ref 70–99)
Potassium: 5.4 mmol/L — ABNORMAL HIGH (ref 3.5–5.1)
Sodium: 134 mmol/L — ABNORMAL LOW (ref 135–145)
Total Bilirubin: 0.8 mg/dL (ref 0.3–1.2)
Total Protein: 6 g/dL — ABNORMAL LOW (ref 6.5–8.1)

## 2019-04-07 MED ORDER — AMLODIPINE BESYLATE 10 MG PO TABS
10.0000 mg | ORAL_TABLET | Freq: Every day | ORAL | Status: DC
  Administered 2019-04-08 – 2019-04-13 (×6): 10 mg via ORAL
  Filled 2019-04-07 (×7): qty 1

## 2019-04-07 NOTE — Progress Notes (Signed)
Postop day 2.  Overall patient doing reasonably well.  Complains of appropriate posterior cervical pain.  Anterior cervical pain minimal.  Swallowing well.  No airway issues.  Wounds clean and dry.  Awake and alert.  Follows commands bilaterally.  Motor and sensory function intact.  Overall progressing well.  Continue with efforts at mobilization with therapy.  Continue Aspen collar.  Long-term patient will need to have his metabolic bone disease addressed.

## 2019-04-07 NOTE — Progress Notes (Signed)
PROGRESS NOTE   Mario [name]  CVE:[id number]    DOB: 1970-08-21    DOA: 04/03/2019  PCP: [name], MD   I have briefly reviewed patients previous medical records in [facility].  Chief complaint Neck pain with radiation to arms and associated weakness.  Brief Narrative:  48 year old Spanish-speaking male, lives alone, independent, PMH of ESRD on TTS HD via LUE AVF, chronic combined systolic and diastolic CHF, LVEF 02% by TTE in 2017, mild mitral regurgitation, cirrhosis, HTN, initially admitted to [facility] on 04/01/2019 due to progressively worsening neck/upper back pain with radiation to bilateral upper extremity with associated tingling, numbness and upper extremity weakness, left> right.  CT cervical spine showed erosive changes compatible with renal spondyloarthropathy at C3-4, C6-7, C7-T1.  Patient was transferred to [facility] for Neurosurgical consultation.  Better after steroids and cervical collar.  Neurosurgery requested preop cardiology clearance which was provided by cardiology.  Patient was dialyzed before the surgery and then on 04/05/2019 he underwent anterior cervical corpectomy, microdissection, posterior cervical fusion.   Assessment & Plan:   Principal Problem:   Cervical radiculopathy Active Problems:   ESRD (end stage renal disease) on dialysis (HCC)   HTN (hypertension)   Neck pain   C6 and 7 pathological fracture with severe spinal stenosis with cord compression and cervical myelopathy due to renal osteodystrophy  CT cervical spine at OSH showed erosive changes compatible with renal spondyloarthropathy at C3-4, C6-7, C7-T1.  Continues to have cervical collar and dexamethasone 4 mg IV every 6 hourly.  Continue Pepcid. Postoperative day 1 status post anterior cervical corpectomy, microdissection, posterior cervical fusion.  Pain controlled.  Continue current pain medications.  Further management per neurosurgery.  Appreciate their help.   ESRD on TTS HD   Had HD at OSH on 8/29.  Nephrology consulted for HD needs.  Will be on TTS schedule.  Secondary hyperparathyroidism  Management per nephrology.  Tobacco abuse  Cessation counseled.  Patient declines nicotine patch.  Chronic combined systolic and diastolic CHF  Last TTE 12/10/5275 showed LVEF 40% with hypokinesis of anteroseptal myocardium and grade 1 diastolic dysfunction.  Mild MR, moderate TR, moderate pulmonary hypertension.  Clinically euvolemic.  Volume management across HD.  Repeat TTE shows normal EF and no MR.  As stated above, requested preop cardiology clearance.  Chest pain/slightly elevated troponin  Apparently, he complained of some chest pain late last afternoon and telemetry nurses had noted ST elevation.  A stat EKG was done which did not show any ST-T wave changes.  Troponins were followed once again and they remained flat once again.  Patient might have had referred anterior neck pain to the chest.  He did not have any chest pain this morning when I saw him.  Cirrhosis/splenomegaly  Noted on abdominal ultrasound 05/02/2018.  Had prior paracentesis 04/2018.  Quit alcohol intake 3 years ago.  Currently without clinical ascites.  INR 1.2 on 8/30.  Essential hypertension  Mildly uncontrolled.  Similar urgency resolved.  PRN IV hydralazine.  Thrombocytopenia  Appears chronic and has been dropping with 86 on 04/06/2019 and is stable at 89 today.  No signs of bleeding.  Watch closely while he is on subcutaneous heparin DVT prophylaxis dose.  DVT prophylaxis: Subcutaneous heparin Code Status: Full Family Communication: None at bedside Disposition: To be determined pending clinical improvement   Consultants:  Neurosurgery Nephrology Cardiology  Procedures:  Cervical collar anterior cervical corpectomy, microdissection, posterior cervical fusion on 04/05/2019.  Antimicrobials:  None   Subjective: Patient  seen and examined.  Complains of bilateral  shoulder pain, anterior and posterior neck pain which is expected.  No other complaint.  No shortness of breath or chest pain.  He was alert and oriented.  Objective:  Vitals:   04/07/19 0039 04/07/19 0355 04/07/19 0845 04/07/19 1200  BP:   (!) 160/72 (!) 126/99  Pulse:   70 78  Resp:   11 11  Temp: 98 F (36.7 C) 98.2 F (36.8 C) 97.8 F (36.6 C) (!) 97.5 F (36.4 C)  TempSrc: Oral Oral Oral Oral  SpO2:   97% 98%  Weight:      Height:        Examination:  General exam: Appears calm and comfortable with Aspen collar in place. Respiratory system: Diminished breath sounds with poor inspiratory effort due to pain. Cardiovascular system: S1 & S2 heard, RRR. No JVD, murmurs, rubs, gallops or clicks. No pedal edema. Gastrointestinal system: Abdomen is nondistended, soft and nontender. No organomegaly or masses felt. Normal bowel sounds heard. Central nervous system: Alert and oriented.  Skin: No rashes, lesions or ulcers Psychiatry: Judgement and insight appear normal. Mood & affect appropriate.   Data Reviewed: I have personally reviewed following labs and imaging studies  CBC: Recent Labs  Lab 04/01/19 1024 04/03/19 0652 04/04/19 0733 04/05/19 0958 04/06/19 1106 04/07/19 0106  WBC 5.4 3.1* 6.3  --  6.8 6.9  NEUTROABS  --   --   --   --  5.8 5.7  HGB 12.5* 13.2 13.4 13.9 11.3* 10.9*  HCT 38.6* 40.0 41.2 41.0 34.2* 33.1*  MCV 98.7 97.8 98.8  --  98.8 99.1  PLT 146* 128* 127*  --  86* 89*   Basic Metabolic Panel: Recent Labs  Lab 04/01/19 1024 04/02/19 0405 04/03/19 0652 04/04/19 1313 04/05/19 0958 04/06/19 1106 04/07/19 0106  NA 132*  --  135 133* 136 136 134*  K 4.2  --  4.7 5.3* 4.2 5.0 5.4*  CL 92*  --  97* 95*  --  98 96*  CO2 25  --  23 21*  --  20* 21*  GLUCOSE 161*  --  144* 120* 108* 149* 140*  BUN 31*  --  25* 45*  --  60* 72*  CREATININE 7.36*  --  6.04* 7.82*  --  7.55* 8.50*  CALCIUM 9.1  --  8.9 8.5*  --  7.3* 7.0*  PHOS  --  7.8*  --  6.5*  --    --   --    Liver Function Tests: Recent Labs  Lab 04/03/19 0652 04/04/19 1313 04/07/19 0106  AST 24  --  24  ALT 18  --  12  ALKPHOS 201*  --  157*  BILITOT 0.9  --  0.8  PROT 6.6  --  6.0*  ALBUMIN 3.2* 3.7 3.1*    Cardiac Enzymes: No results for input(s): CKTOTAL, CKMB, CKMBINDEX, TROPONINI in the last 168 hours.  CBG: Recent Labs  Lab 04/03/19 1648  GLUCAP 157*    Recent Results (from the past 240 hour(s))  SARS Coronavirus 2 [facility] order, Performed in [facility] hospital lab) Nasopharyngeal Nasopharyngeal Swab     Status: None   Collection Time: 04/01/19 11:15 AM   Specimen: Nasopharyngeal Swab  Result Value Ref Range Status   SARS Coronavirus 2 NEGATIVE NEGATIVE Final    Comment: (NOTE) If result is NEGATIVE SARS-CoV-2 target nucleic acids are NOT DETECTED. The SARS-CoV-2 RNA is generally detectable in upper and lower  respiratory specimens during the acute phase of infection. The lowest  concentration of SARS-CoV-2 viral copies this assay can detect is 250  copies / mL. A negative result does not preclude SARS-CoV-2 infection  and should not be used as the sole basis for treatment or other  patient management decisions.  A negative result may occur with  improper specimen collection / handling, submission of specimen other  than nasopharyngeal swab, presence of viral mutation(s) within the  areas targeted by this assay, and inadequate number of viral copies  (<250 copies / mL). A negative result must be combined with clinical  observations, patient history, and epidemiological information. If result is POSITIVE SARS-CoV-2 target nucleic acids are DETECTED. The SARS-CoV-2 RNA is generally detectable in upper and lower  respiratory specimens dur ing the acute phase of infection.  Positive  results are indicative of active infection with SARS-CoV-2.  Clinical  correlation with patient history and other diagnostic information is  necessary to determine  patient infection status.  Positive results do  not rule out bacterial infection or co-infection with other viruses. If result is PRESUMPTIVE POSTIVE SARS-CoV-2 nucleic acids MAY BE PRESENT.   A presumptive positive result was obtained on the submitted specimen  and confirmed on repeat testing.  While 2019 novel coronavirus  (SARS-CoV-2) nucleic acids may be present in the submitted sample  additional confirmatory testing may be necessary for epidemiological  and / or clinical management purposes  to differentiate between  SARS-CoV-2 and other Sarbecovirus currently known to infect humans.  If clinically indicated additional testing with an alternate test  methodology [phone]) is advised. The SARS-CoV-2 RNA is generally  detectable in upper and lower respiratory sp ecimens during the acute  phase of infection. The expected result is Negative. Fact Sheet for Patients:  StrictlyIdeas.no Fact Sheet for Healthcare Providers: BankingDealers.co.za This test is not yet approved or cleared by the [place] FDA and has been authorized for detection and/or diagnosis of SARS-CoV-2 by FDA under an Emergency Use Authorization (EUA).  This EUA will remain in effect (meaning this test can be used) for the duration of the COVID-19 declaration under Section 564(b)(1) of the Act, 21 U.S.C. section 360bbb-3(b)(1), unless the authorization is terminated or revoked sooner. Performed at [facility], [address] [place], [place] [address]   MRSA PCR Screening     Status: None   Collection Time: 04/03/19  6:32 AM   Specimen: Nasal Mucosa; Nasopharyngeal  Result Value Ref Range Status   MRSA by PCR NEGATIVE NEGATIVE Final    Comment:        The GeneXpert MRSA Assay (FDA approved for NASAL specimens only), is one component of a comprehensive MRSA colonization surveillance program. It is not intended to diagnose MRSA infection nor to  guide or monitor treatment for MRSA infections. Performed at [facility], [place] [address] [place],  [address]          Radiology Studies: Dg Cervical Spine 2-3 Views  Result Date: 04/05/2019 CLINICAL DATA:  Cervical fusion EXAM: CERVICAL SPINE - 2-3 VIEW; DG C-ARM 1-60 MIN COMPARISON:  None. FLUOROSCOPY TIME:  Radiation Exposure Index (as provided by the fluoroscopic device): Not available If the device does not provide the exposure index: Fluoroscopy Time:  43 seconds Number of Acquired Images:  5 FINDINGS: Initial images demonstrate a surgical instrument anteriorly at what appears to be C6-7 at the area of vertebral collapse. Subsequent films demonstrate evidence of interbody fusion at C5-6 and C6-7  with anterior fixation. Subsequently posterior fixation elements are noted from C5-T1. IMPRESSION: Cervical fusion both anteriorly and posteriorly. Electronically Signed   By: Inez Catalina M.D.   On: 04/05/2019 14:53   Dg C-arm 1-60 Min  Result Date: 04/05/2019 CLINICAL DATA:  Cervical fusion EXAM: CERVICAL SPINE - 2-3 VIEW; DG C-ARM 1-60 MIN COMPARISON:  None. FLUOROSCOPY TIME:  Radiation Exposure Index (as provided by the fluoroscopic device): Not available If the device does not provide the exposure index: Fluoroscopy Time:  43 seconds Number of Acquired Images:  5 FINDINGS: Initial images demonstrate a surgical instrument anteriorly at what appears to be C6-7 at the area of vertebral collapse. Subsequent films demonstrate evidence of interbody fusion at C5-6 and C6-7 with anterior fixation. Subsequently posterior fixation elements are noted from C5-T1. IMPRESSION: Cervical fusion both anteriorly and posteriorly. Electronically Signed   By: Inez Catalina M.D.   On: 04/05/2019 14:53        Scheduled Meds: . amLODipine  10 mg Oral Daily  . Chlorhexidine Gluconate Cloth  6 each Topical Q0600  . Chlorhexidine Gluconate Cloth  6 each Topical Q0600  . cyclobenzaprine  10 mg Oral TID   . dexamethasone (DECADRON) injection  4 mg Intravenous Q6H  . famotidine  20 mg Oral Daily  . heparin  5,000 Units Subcutaneous Q8H  . sodium chloride flush  3 mL Intravenous Q12H   Continuous Infusions: . sodium chloride       LOS: 4 days     Darliss Cheney, MD,  [facility]  To contact the attending provider between 7A-7P or the covering provider during after hours 7P-7A, please log into the web site [email or web address] and access using universal [place] password for that web site. If you do not have the password, please call the hospital operator.  04/07/2019, 1:13 PM

## 2019-04-07 NOTE — Progress Notes (Signed)
[facility] Progress Note   Subjective:   Patient seen in room, no new c/o.    Objective Vitals:   04/07/19 0000 04/07/19 0039 04/07/19 0355 04/07/19 0845  BP: (!) 169/59   (!) 160/72  Pulse: 70   70  Resp: 12   11  Temp:  98 F (36.7 C) 98.2 F (36.8 C) 97.8 F (36.6 C)  TempSrc:  Oral Oral Oral  SpO2:    97%  Weight:      Height:       Physical Exam General:NAD, chronically ill appearing male, laying in bed Heart:RRRc Lungs:CTAB Abdomen:soft, NTND Extremities:no LE edema Dialysis Access: LU AVF cannulated   Filed Weights   04/04/19 1347 04/04/19 1738 04/05/19 0953  Weight: 66.6 kg 64.6 kg 64.6 kg    Intake/Output Summary (Last 24 hours) at 04/07/2019 1147 Last data filed at 04/06/2019 1500 Gross per 24 hour  Intake 458 ml  Output -  Net 458 ml    Additional Objective Labs: Basic Metabolic Panel: Recent Labs  Lab 04/02/19 0405  04/04/19 1313 04/05/19 0958 04/06/19 1106 04/07/19 0106  NA  --    < > 133* 136 136 134*  K  --    < > 5.3* 4.2 5.0 5.4*  CL  --    < > 95*  --  98 96*  CO2  --    < > 21*  --  20* 21*  GLUCOSE  --    < > 120* 108* 149* 140*  BUN  --    < > 45*  --  60* 72*  CREATININE  --    < > 7.82*  --  7.55* 8.50*  CALCIUM  --    < > 8.5*  --  7.3* 7.0*  PHOS 7.8*  --  6.5*  --   --   --    < > = values in this interval not displayed.   Liver Function Tests: Recent Labs  Lab 04/03/19 0652 04/04/19 1313 04/07/19 0106  AST 24  --  24  ALT 18  --  12  ALKPHOS 201*  --  157*  BILITOT 0.9  --  0.8  PROT 6.6  --  6.0*  ALBUMIN 3.2* 3.7 3.1*   No results for input(s): LIPASE, AMYLASE in the last 168 hours. CBC: Recent Labs  Lab 04/01/19 1024 04/03/19 0652 04/04/19 0733 04/05/19 0958 04/06/19 1106 04/07/19 0106  WBC 5.4 3.1* 6.3  --  6.8 6.9  NEUTROABS  --   --   --   --  5.8 5.7  HGB 12.5* 13.2 13.4 13.9 11.3* 10.9*  HCT 38.6* 40.0 41.2 41.0 34.2* 33.1*  MCV 98.7 97.8 98.8  --  98.8 99.1  PLT 146* 128* 127*   --  86* 89*   Blood Culture    Component Value Date/Time   SDES  05/03/2018 1345    PLEURAL Performed at [facility], [address], [place], [place] [address]    [facility]  05/03/2018 1345    NONE Performed at [facility], [address], [place], [place] [address]    CULT  05/03/2018 1345    NO GROWTH 3 DAYS Performed at [facility], [place] [address] [place], [place] [address]    REPTSTATUS 05/07/2018 FINAL 05/03/2018 1345    Cardiac Enzymes: No results for input(s): CKTOTAL, CKMB, CKMBINDEX, TROPONINI in the last 168 hours. CBG: Recent Labs  Lab 04/03/19 1648  GLUCAP 157*  Iron Studies: No results for input(s): IRON, TIBC, TRANSFERRIN, FERRITIN in the last 72 hours. Lab Results  Component Value Date   INR 1.2 04/03/2019   INR 1.17 01/22/2018   INR 1.19 02/25/2017   Studies/Results: Dg Cervical Spine 2-3 Views  Result Date: 04/05/2019 CLINICAL DATA:  Cervical fusion EXAM: CERVICAL SPINE - 2-3 VIEW; DG C-ARM 1-60 MIN COMPARISON:  None. FLUOROSCOPY TIME:  Radiation Exposure Index (as provided by the fluoroscopic device): Not available If the device does not provide the exposure index: Fluoroscopy Time:  43 seconds Number of Acquired Images:  5 FINDINGS: Initial images demonstrate a surgical instrument anteriorly at what appears to be C6-7 at the area of vertebral collapse. Subsequent films demonstrate evidence of interbody fusion at C5-6 and C6-7 with anterior fixation. Subsequently posterior fixation elements are noted from C5-T1. IMPRESSION: Cervical fusion both anteriorly and posteriorly. Electronically Signed   By: Inez Catalina M.D.   On: 04/05/2019 14:53   Dg C-arm 1-60 Min  Result Date: 04/05/2019 CLINICAL DATA:  Cervical fusion EXAM: CERVICAL SPINE - 2-3 VIEW; DG C-ARM 1-60 MIN COMPARISON:  None. FLUOROSCOPY TIME:  Radiation Exposure Index (as provided by the fluoroscopic device): Not available If the device does not provide the exposure  index: Fluoroscopy Time:  43 seconds Number of Acquired Images:  5 FINDINGS: Initial images demonstrate a surgical instrument anteriorly at what appears to be C6-7 at the area of vertebral collapse. Subsequent films demonstrate evidence of interbody fusion at C5-6 and C6-7 with anterior fixation. Subsequently posterior fixation elements are noted from C5-T1. IMPRESSION: Cervical fusion both anteriorly and posteriorly. Electronically Signed   By: Inez Catalina M.D.   On: 04/05/2019 14:53    Medications: . sodium chloride     . amLODipine  10 mg Oral Daily  . Chlorhexidine Gluconate Cloth  6 each Topical Q0600  . Chlorhexidine Gluconate Cloth  6 each Topical Q0600  . cyclobenzaprine  10 mg Oral TID  . dexamethasone (DECADRON) injection  4 mg Intravenous Q6H  . famotidine  20 mg Oral Daily  . heparin  5,000 Units Subcutaneous Q8H  . sodium chloride flush  3 mL Intravenous Q12H    Dialysis Orders: [facility]  HD TTS / [employer] 3h  2/ 2.5Ca    800/450   65kg   Hep none  L AVF No Heparin hectorol 18mcg qHD   Assessment/Plan: 1. Neck pain/cervical myelopathy - SP post cervical fusion C5-C7 2.  ESRD -  On HD TTS.  Had HD Monday off schedule. Next HD today to get back on schedule 3.  Hypertension/volume  - BP good. No vol^ on exam. Small UF w/ HD today 4.  Anemia of CKD - Hgb 13.2. No indication for ESA. 5.  Secondary Hyperparathyroidism -  Ca at goal. Phos elevated. Continue binders. 6.  Nutrition - Renal diet w/fluid restrictions 7. Hx CHF - Echo ordered 8. Alcoholic Cirrhosis   Kelly Splinter, MD 04/07/2019, 11:47 AM

## 2019-04-07 NOTE — Progress Notes (Signed)
PT Cancellation Note  Patient Details Name: Mario [name] MRN: [id number] DOB: 1971-01-29   Cancelled Treatment:    Reason Eval/Treat Not Completed: Patient at procedure or test/unavailable   Ellamae Sia, PT, DPT Acute Rehabilitation Services Pager [phone] Office [phone]    Willy Eddy 04/07/2019, 2:43 PM

## 2019-04-07 NOTE — TOC Initial Note (Signed)
Transition of Care [facility]) - Initial/Assessment Note    Patient Details  Name: Mario [name] MRN: [id number] Date of Birth: Sep 05, 1970  Transition of Care [facility]) CM/SW Contact:    Vinie Sill, [place] Phone Number: 04/07/2019, 6:01 PM  Clinical Narrative:                  CSW received patient's niece number fromRN. CSW spoke with the patient's niece, Lenna Sciara [phone]. She states the patient lives in the home with his brother and cousin however, she is the person that attends to the patient's needs.  She states she lives next door and to the patient, she is a Marine scientist, and the patient has plenty of family to help assist him. She expressed she would like to be updated on the patient's medical condition, especially before returning home. CSW forward this message to the RN.  She doesn't believe he would agree to SNF if recommended. Family states no other questions or concerns at this time.CSW will continue to follow and assist with discharge planning.    Thurmond Butts, MSW, [place] Clinical Social Worker [phone]    Barriers to Discharge: Continued Medical Work up   Patient Goals and CMS Choice        Expected Discharge Plan and Services         Living arrangements for the past 2 months: Single Family Home                                      Prior Living Arrangements/Services Living arrangements for the past 2 months: Single Family Home Lives with:: Self, Relatives Patient language and need for interpreter reviewed:: Yes        Need for Family Participation in Patient Care: Yes (Comment) Care giver support system in place?: Yes (comment)   Criminal Activity/Legal Involvement Pertinent to Current Situation/Hospitalization: No - Comment as needed  Activities of Daily Living      Permission Sought/Granted Permission sought to share information with : Family Supports Permission granted to share information with : Yes, Verbal Permission  Granted  Share Information with NAME: Lenna Sciara     Permission granted to share info w Relationship: Niece  Permission granted to share info w Contact Information: [phone]  Emotional Assessment Appearance:: Other (Comment Required Attitude/Demeanor/Rapport: Unable to Assess Affect (typically observed): Unable to Assess Orientation: : Oriented to Self, Oriented to Place, Oriented to  Time, Oriented to Situation Alcohol / Substance Use: Not Applicable Psych Involvement: No (comment)  Admission diagnosis:  Weakness  Patient Active Problem List   Diagnosis Date Noted  . Neck pain 04/03/2019  . Cervical radiculopathy 04/03/2019  . Hypertensive urgency 04/01/2019  . Intractable abdominal pain 05/01/2018  . Gastroenteritis 10/02/2017  . Abdominal pain, acute 08/21/2017  . Malnutrition of moderate degree 08/21/2017  . SBP (spontaneous bacterial peritonitis) ([place]) 06/09/2017  . Erroneous encounter - disregard 03/04/2017  . Hyperkalemia 11/27/2016  . Acute hyperkalemia 10/28/2016  . Chronic combined systolic and diastolic CHF (congestive heart failure) ([place]) 10/13/2016  . Cirrhosis ([place]) 10/13/2016  . HTN (hypertension) 10/13/2016  . Abdominal pain 08/28/2016  . Pulmonary edema 07/03/2016  . Fluid overload 04/12/2016  . Chest pain 04/11/2016  . Scrotal infection 01/25/2015  . ESRD (end stage renal disease) on dialysis ([place]) 01/25/2015   PCP:  [name], MD Pharmacy:   [facility] [place], [place]  [address] AT [facility] [address] [place] [address] Phone: [phone] Fax: [phone]  [place] [address]), [place] - [place]) [place] [address] Phone: [phone] Fax: [phone]     Social Determinants of Health (SDOH) Interventions    Readmission Risk Interventions No flowsheet data found.

## 2019-04-08 ENCOUNTER — Encounter (HOSPITAL_COMMUNITY): Payer: Self-pay | Admitting: Neurosurgery

## 2019-04-08 LAB — CBC WITH DIFFERENTIAL/PLATELET
Abs Immature Granulocytes: 0.15 10*3/uL — ABNORMAL HIGH (ref 0.00–0.07)
Basophils Absolute: 0 10*3/uL (ref 0.0–0.1)
Basophils Relative: 0 %
Eosinophils Absolute: 0 10*3/uL (ref 0.0–0.5)
Eosinophils Relative: 0 %
HCT: 31.9 % — ABNORMAL LOW (ref 39.0–52.0)
Hemoglobin: 10.4 g/dL — ABNORMAL LOW (ref 13.0–17.0)
Immature Granulocytes: 2 %
Lymphocytes Relative: 6 %
Lymphs Abs: 0.4 10*3/uL — ABNORMAL LOW (ref 0.7–4.0)
MCH: 33 pg (ref 26.0–34.0)
MCHC: 32.6 g/dL (ref 30.0–36.0)
MCV: 101.3 fL — ABNORMAL HIGH (ref 80.0–100.0)
Monocytes Absolute: 0.4 10*3/uL (ref 0.1–1.0)
Monocytes Relative: 6 %
Neutro Abs: 6.2 10*3/uL (ref 1.7–7.7)
Neutrophils Relative %: 86 %
Platelets: 94 10*3/uL — ABNORMAL LOW (ref 150–400)
RBC: 3.15 MIL/uL — ABNORMAL LOW (ref 4.22–5.81)
RDW: 14.6 % (ref 11.5–15.5)
WBC: 7.2 10*3/uL (ref 4.0–10.5)
nRBC: 0.4 % — ABNORMAL HIGH (ref 0.0–0.2)

## 2019-04-08 LAB — COMPREHENSIVE METABOLIC PANEL
ALT: 7 U/L (ref 0–44)
AST: 20 U/L (ref 15–41)
Albumin: 2.9 g/dL — ABNORMAL LOW (ref 3.5–5.0)
Alkaline Phosphatase: 141 U/L — ABNORMAL HIGH (ref 38–126)
Anion gap: 13 (ref 5–15)
BUN: 44 mg/dL — ABNORMAL HIGH (ref 6–20)
CO2: 25 mmol/L (ref 22–32)
Calcium: 7 mg/dL — ABNORMAL LOW (ref 8.9–10.3)
Chloride: 95 mmol/L — ABNORMAL LOW (ref 98–111)
Creatinine, Ser: 6.15 mg/dL — ABNORMAL HIGH (ref 0.61–1.24)
GFR calc Af Amer: 11 mL/min — ABNORMAL LOW (ref >60)
GFR calc non Af Amer: 10 mL/min — ABNORMAL LOW (ref >60)
Glucose, Bld: 131 mg/dL — ABNORMAL HIGH (ref 70–99)
Potassium: 4.5 mmol/L (ref 3.5–5.1)
Sodium: 133 mmol/L — ABNORMAL LOW (ref 135–145)
Total Bilirubin: 1.2 mg/dL (ref 0.3–1.2)
Total Protein: 5.7 g/dL — ABNORMAL LOW (ref 6.5–8.1)

## 2019-04-08 LAB — MAGNESIUM: Magnesium: 2.4 mg/dL (ref 1.7–2.4)

## 2019-04-08 MED ORDER — CHLORHEXIDINE GLUCONATE CLOTH 2 % EX PADS
6.0000 | MEDICATED_PAD | Freq: Every day | CUTANEOUS | Status: DC
  Administered 2019-04-09 – 2019-04-13 (×5): 6 via TOPICAL

## 2019-04-08 NOTE — Progress Notes (Signed)
PROGRESS NOTE   Mario [name]  FUX:[id number]    DOB: [birth year]    DOA: 04/03/2019  PCP: [name], MD   I have briefly reviewed patients previous medical records in [facility].  Chief complaint Neck pain with radiation to arms and associated weakness.  Brief Narrative:  48 year old Spanish-speaking male, lives alone, independent, PMH of ESRD on TTS HD via LUE AVF, chronic combined systolic and diastolic CHF, LVEF 02% by TTE in 2017, mild mitral regurgitation, cirrhosis, HTN, initially admitted to [facility] on 04/01/2019 due to progressively worsening neck/upper back pain with radiation to bilateral upper extremity with associated tingling, numbness and upper extremity weakness, left> right.  CT cervical spine showed erosive changes compatible with renal spondyloarthropathy at C3-4, C6-7, C7-T1.  Patient was transferred to [facility] for Neurosurgical consultation.  Better after steroids and cervical collar.  Neurosurgery requested preop cardiology clearance which was provided by cardiology.  Patient was dialyzed before the surgery and then on 04/05/2019 he underwent anterior cervical corpectomy, microdissection, posterior cervical fusion.   Assessment & Plan:   Principal Problem:   Cervical radiculopathy Active Problems:   ESRD (end stage renal disease) on dialysis (HCC)   HTN (hypertension)   Neck pain   C6 and 7 pathological fracture with severe spinal stenosis with cord compression and cervical myelopathy due to renal osteodystrophy  CT cervical spine at OSH showed erosive changes compatible with renal spondyloarthropathy at C3-4, C6-7, C7-T1.  Continues to have cervical collar and dexamethasone 4 mg IV every 6 hourly.  Continue Pepcid. Postoperative day 1 status post anterior cervical corpectomy, microdissection, posterior cervical fusion.  Pain controlled.  Continue current pain medications.  Good strength in all extremities.  Further management per neurosurgery.   Appreciate their help.   ESRD on TTS HD  Had HD at OSH on 8/29.  Nephrology consulted for HD needs.  Will be on TTS schedule.  Secondary hyperparathyroidism  Management per nephrology.  Tobacco abuse  Cessation counseled.  Patient declines nicotine patch.  Chronic combined systolic and diastolic CHF  Last TTE 12/05/2704 showed LVEF 40% with hypokinesis of anteroseptal myocardium and grade 1 diastolic dysfunction.  Mild MR, moderate TR, moderate pulmonary hypertension.  Clinically euvolemic.  Volume management across HD.  Repeat TTE shows normal EF and no MR.  As stated above, requested preop cardiology clearance.  Chest pain/slightly elevated troponin  Apparently, he complained of some chest pain late afternoon of 04/06/2019 and telemetry nurses had noted ST elevation.  A stat EKG was done which did not show any ST-T wave changes.  Troponins were followed once again and they remained flat once again.  Patient might have had referred anterior neck pain to the chest.  He did not have any chest pain this morning when I saw him.  Cirrhosis/splenomegaly  Noted on abdominal ultrasound 05/02/2018.  Had prior paracentesis 04/2018.  Quit alcohol intake 3 years ago.  Currently without clinical ascites.  INR 1.2 on 8/30.  Essential hypertension  Fairly controlled.  Continue PRN hydralazine.  Thrombocytopenia  Appears chronic and has been dropping with 86 on 04/06/2019 and improving and now 94.  No signs of bleeding.  Watch closely while he is on subcutaneous heparin DVT prophylaxis dose.  DVT prophylaxis: Subcutaneous heparin Code Status: Full Family Communication: None at bedside Disposition: CIR when medically stable and cleared by consultants.   Consultants:  Neurosurgery Nephrology Cardiology  Procedures:  Cervical collar anterior cervical corpectomy, microdissection, posterior cervical fusion on 04/05/2019.  Antimicrobials:  None  Subjective: Patient seen and  examined.  Eating food when I saw him.  Continues to complain of anterior and posterior neck pain and bilateral shoulder pain.  No chest pain or shortness of breath.  Looks comfortable.  Objective:  Vitals:   04/07/19 2348 04/08/19 0355 04/08/19 0818 04/08/19 1208  BP: (!) 149/74 (!) 154/72 (!) 156/79 (!) 167/87  Pulse: 73 69 74 94  Resp: 12 14 14  (!) 26  Temp: 97.7 F (36.5 C) 97.7 F (36.5 C) 97.6 F (36.4 C) 97.9 F (36.6 C)  TempSrc: Oral Oral Oral Oral  SpO2: 97% 97% 98% 97%  Weight:      Height:        Examination:  General exam: Appears calm and comfortable, Aspen collar in place. Respiratory system: Clear to auscultation. Respiratory effort normal. Cardiovascular system: S1 & S2 heard, RRR. No JVD, murmurs, rubs, gallops or clicks. No pedal edema. Gastrointestinal system: Abdomen is nondistended, soft and nontender. No organomegaly or masses felt. Normal bowel sounds heard. Central nervous system: Alert and oriented. No focal neurological deficits. Extremities: Symmetric 5 x 5 power. Skin: No rashes, lesions or ulcers.  Psychiatry: Judgement and insight appear normal. Mood & affect appropriate.   Data Reviewed: I have personally reviewed following labs and imaging studies  CBC: Recent Labs  Lab 04/03/19 0652 04/04/19 0733 04/05/19 0958 04/06/19 1106 04/07/19 0106 04/08/19 0531  WBC 3.1* 6.3  --  6.8 6.9 7.2  NEUTROABS  --   --   --  5.8 5.7 6.2  HGB 13.2 13.4 13.9 11.3* 10.9* 10.4*  HCT 40.0 41.2 41.0 34.2* 33.1* 31.9*  MCV 97.8 98.8  --  98.8 99.1 101.3*  PLT 128* 127*  --  86* 89* 94*   Basic Metabolic Panel: Recent Labs  Lab 04/02/19 0405  04/03/19 0652 04/04/19 1313 04/05/19 0958 04/06/19 1106 04/07/19 0106 04/08/19 0531  NA  --    < > 135 133* 136 136 134* 133*  K  --    < > 4.7 5.3* 4.2 5.0 5.4* 4.5  CL  --   --  97* 95*  --  98 96* 95*  CO2  --   --  23 21*  --  20* 21* 25  GLUCOSE  --    < > 144* 120* 108* 149* 140* 131*  BUN  --   --   25* 45*  --  60* 72* 44*  CREATININE  --   --  6.04* 7.82*  --  7.55* 8.50* 6.15*  CALCIUM  --   --  8.9 8.5*  --  7.3* 7.0* 7.0*  MG  --   --   --   --   --   --   --  2.4  PHOS 7.8*  --   --  6.5*  --   --   --   --    < > = values in this interval not displayed.   Liver Function Tests: Recent Labs  Lab 04/03/19 0652 04/04/19 1313 04/07/19 0106 04/08/19 0531  AST 24  --  24 20  ALT 18  --  12 7  ALKPHOS 201*  --  157* 141*  BILITOT 0.9  --  0.8 1.2  PROT 6.6  --  6.0* 5.7*  ALBUMIN 3.2* 3.7 3.1* 2.9*    Cardiac Enzymes: No results for input(s): CKTOTAL, CKMB, CKMBINDEX, TROPONINI in the last 168 hours.  CBG: Recent Labs  Lab 04/03/19 1648  GLUCAP 157*  Recent Results (from the past 240 hour(s))  SARS Coronavirus 2 [facility] order, Performed in [facility] hospital lab) Nasopharyngeal Nasopharyngeal Swab     Status: None   Collection Time: 04/01/19 11:15 AM   Specimen: Nasopharyngeal Swab  Result Value Ref Range Status   SARS Coronavirus 2 NEGATIVE NEGATIVE Final    Comment: (NOTE) If result is NEGATIVE SARS-CoV-2 target nucleic acids are NOT DETECTED. The SARS-CoV-2 RNA is generally detectable in upper and lower  respiratory specimens during the acute phase of infection. The lowest  concentration of SARS-CoV-2 viral copies this assay can detect is 250  copies / mL. A negative result does not preclude SARS-CoV-2 infection  and should not be used as the sole basis for treatment or other  patient management decisions.  A negative result may occur with  improper specimen collection / handling, submission of specimen other  than nasopharyngeal swab, presence of viral mutation(s) within the  areas targeted by this assay, and inadequate number of viral copies  (<250 copies / mL). A negative result must be combined with clinical  observations, patient history, and epidemiological information. If result is POSITIVE SARS-CoV-2 target nucleic acids are DETECTED. The  SARS-CoV-2 RNA is generally detectable in upper and lower  respiratory specimens dur ing the acute phase of infection.  Positive  results are indicative of active infection with SARS-CoV-2.  Clinical  correlation with patient history and other diagnostic information is  necessary to determine patient infection status.  Positive results do  not rule out bacterial infection or co-infection with other viruses. If result is PRESUMPTIVE POSTIVE SARS-CoV-2 nucleic acids MAY BE PRESENT.   A presumptive positive result was obtained on the submitted specimen  and confirmed on repeat testing.  While 2019 novel coronavirus  (SARS-CoV-2) nucleic acids may be present in the submitted sample  additional confirmatory testing may be necessary for epidemiological  and / or clinical management purposes  to differentiate between  SARS-CoV-2 and other Sarbecovirus currently known to infect humans.  If clinically indicated additional testing with an alternate test  methodology [phone]) is advised. The SARS-CoV-2 RNA is generally  detectable in upper and lower respiratory sp ecimens during the acute  phase of infection. The expected result is Negative. Fact Sheet for Patients:  StrictlyIdeas.no Fact Sheet for Healthcare Providers: BankingDealers.co.za This test is not yet approved or cleared by the [place] FDA and has been authorized for detection and/or diagnosis of SARS-CoV-2 by FDA under an Emergency Use Authorization (EUA).  This EUA will remain in effect (meaning this test can be used) for the duration of the COVID-19 declaration under Section 564(b)(1) of the Act, 21 U.S.C. section 360bbb-3(b)(1), unless the authorization is terminated or revoked sooner. Performed at [facility], [address] [place], [place] [address]   MRSA PCR Screening     Status: None   Collection Time: 04/03/19  6:32 AM   Specimen: Nasal Mucosa;  Nasopharyngeal  Result Value Ref Range Status   MRSA by PCR NEGATIVE NEGATIVE Final    Comment:        The GeneXpert MRSA Assay (FDA approved for NASAL specimens only), is one component of a comprehensive MRSA colonization surveillance program. It is not intended to diagnose MRSA infection nor to guide or monitor treatment for MRSA infections. Performed at [facility], [place] [address], [place], [place] [address]          Radiology Studies: No results found.      Scheduled Meds: .  amLODipine  10 mg Oral Daily  . [START ON 04/09/2019] Chlorhexidine Gluconate Cloth  6 each Topical Q0600  . cyclobenzaprine  10 mg Oral TID  . dexamethasone (DECADRON) injection  4 mg Intravenous Q6H  . famotidine  20 mg Oral Daily  . heparin  5,000 Units Subcutaneous Q8H  . sodium chloride flush  3 mL Intravenous Q12H   Continuous Infusions: . sodium chloride       LOS: 5 days     Darliss Cheney, MD,  [facility]  To contact the attending provider between 7A-7P or the covering provider during after hours 7P-7A, please log into the web site [email or web address] and access using universal  password for that web site. If you do not have the password, please call the hospital operator.  04/08/2019, 1:24 PM

## 2019-04-08 NOTE — Plan of Care (Signed)
  Problem: Pain Management: Goal: Pain level will decrease Outcome: Progressing   Problem: Bowel/Gastric: Goal: Gastrointestinal status for postoperative course will improve Outcome: Not Progressing

## 2019-04-08 NOTE — Evaluation (Signed)
Physical Therapy Evaluation Patient Details Name: Mario [name] MRN: [id number] DOB: 1971-01-05 Today's Date: 04/08/2019   History of Present Illness  48 yo male s/p cervical fusion C5-C7  C6-7 fx with severe spinal stenosis with cord compression pMH: alcoholic cirrhosis, ERSD, HTN, CHF  Clinical Impression  Patient is s/p above surgery resulting in the deficits listed below (see PT Problem List). Pt presents with decreased functional mobility secondary to dynamic balance impairments, pain, weakness, decreased activity tolerance, decreased knowledge of precautions. Ambulating 80 feet with walker at a min assist level, heavy reliance through arms on walker. Prior to admission, pt lives at home alone. Given deficits listed above, would benefit from CIR to advance to modI level in addition to reinforcement of education given language barrier. Patient will benefit from skilled PT to increase their independence and safety with mobility (while adhering to their precautions) to allow discharge to the venue listed below.      Follow Up Recommendations CIR    Equipment Recommendations  Rolling walker with 5" wheels;3in1 (PT)    Recommendations for Other Services Rehab consult     Precautions / Restrictions Precautions Precautions: Cervical Precaution Comments: pt is unable to read english or spanish so all education will need to be provided in great detail to ensure learning. spoke with RN and advised that family be present for any discharge instructions.  Required Braces or Orthoses: Cervical Brace Cervical Brace: Hard collar;At all times Restrictions Weight Bearing Restrictions: No      Mobility  Bed Mobility Overal bed mobility: Needs Assistance Bed Mobility: Rolling;Sidelying to Sit Rolling: Supervision Sidelying to sit: Supervision       General bed mobility comments: cues for log roll technique  Transfers Overall transfer level: Needs assistance Equipment used:  Rolling walker (2 wheeled) Transfers: Sit to/from Stand Sit to Stand: Min assist         General transfer comment: pt holding onto RW for transfer. pt needs cues for safety with UE use   Ambulation/Gait Ambulation/Gait assistance: Min assist Gait Distance (Feet): 80 Feet Assistive device: Rolling walker (2 wheeled) Gait Pattern/deviations: Step-through pattern;Decreased stride length;Scissoring;Narrow base of support Gait velocity: decreased Gait velocity interpretation: <1.8 ft/sec, indicate of risk for recurrent falls General Gait Details: Pt with heavily guarded gait pattern, cues for scapular depression and less weight through arms on walker in addition to wider BOS. Tends to display very narrow BOS and scissor at times.   Stairs            Wheelchair Mobility    Modified Rankin (Stroke Patients Only)       Balance Overall balance assessment: Needs assistance Sitting-balance support: Feet supported Sitting balance-Leahy Scale: Good     Standing balance support: Bilateral upper extremity supported;During functional activity Standing balance-Leahy Scale: Poor Standing balance comment: reliant on UE support                             Pertinent Vitals/Pain Pain Assessment: Faces Faces Pain Scale: Hurts little more Pain Location: neck Pain Descriptors / Indicators: Operative site guarding Pain Intervention(s): Repositioned;Monitored during session    Home Living Family/patient expects to be discharged to:: Private residence Living Arrangements: Alone Available Help at Discharge: Friend(s);Available PRN/intermittently Type of Home: Mobile home Home Access: Stairs to enter   Entrance Stairs-Number of Steps: 3 Home Layout: One level Home Equipment: Crutches Additional Comments: will be home alone but lives with brother/ cousin  Prior Function Level of Independence: Independent         Comments: Reports 1 fall in past 4 weeks, friend  drives him to dialysis     Hand Dominance   Dominant Hand: Right    Extremity/Trunk Assessment   Upper Extremity Assessment Upper Extremity Assessment: Defer to OT evaluation    Lower Extremity Assessment Lower Extremity Assessment: RLE deficits/detail;LLE deficits/detail RLE Deficits / Details: Strength 5/5 LLE Deficits / Details: Strength 5/5    Cervical / Trunk Assessment Cervical / Trunk Assessment: Other exceptions Cervical / Trunk Exceptions: s/p surg  Communication   Communication: Prefers language other than English(spanish)  Cognition Arousal/Alertness: Awake/alert Behavior During Therapy: Agitated Overall Cognitive Status: Impaired/Different from baseline Area of Impairment: Memory                     Memory: Decreased recall of precautions         General Comments: pt upset about food options and stating no one is listening to him. OT spoke with MD and Rn and diet is now changed to regular diet. OT calling the dietary services to help order lunch and breakfast to include a bagel whichis patients request.       General Comments General comments (skin integrity, edema, etc.): educated in detail for cervical precautions and aspen collar don doff , change of pads, use of [place] collar for shower transfer if crossing the tub     Exercises  Interpreter, Di Kindle, utilized from [employer] for this session.   Assessment/Plan    PT Assessment Patient needs continued PT services  PT Problem List Decreased strength;Decreased activity tolerance;Decreased balance;Decreased mobility;Pain;Decreased safety awareness;Decreased knowledge of precautions       PT Treatment Interventions DME instruction;Gait training;Stair training;Therapeutic activities;Therapeutic exercise;Functional mobility training;Balance training;Patient/family education    PT Goals (Current goals can be found in the Care Plan section)  Acute Rehab PT Goals Patient Stated  Goal: to get food so i can get my strength back PT Goal Formulation: With patient Time For Goal Achievement: 04/22/19 Potential to Achieve Goals: Good    Frequency Min 5X/week   Barriers to discharge        Co-evaluation PT/OT/SLP Co-Evaluation/Treatment: Yes Reason for Co-Treatment: Other (comment)(to provide interpreter services) PT goals addressed during session: Mobility/safety with mobility         AM-PAC PT "6 Clicks" Mobility  Outcome Measure Help needed turning from your back to your side while in a flat bed without using bedrails?: None Help needed moving from lying on your back to sitting on the side of a flat bed without using bedrails?: None Help needed moving to and from a bed to a chair (including a wheelchair)?: A Little Help needed standing up from a chair using your arms (e.g., wheelchair or bedside chair)?: A Little Help needed to walk in hospital room?: A Little Help needed climbing 3-5 steps with a railing? : A Lot 6 Click Score: 19    End of Session Equipment Utilized During Treatment: Cervical collar Activity Tolerance: Patient tolerated treatment well Patient left: in chair;with call bell/phone within reach;with chair alarm set;Other (comment)(with OT) Nurse Communication: Mobility status PT Visit Diagnosis: Unsteadiness on feet (R26.81);Other abnormalities of gait and mobility (R26.89);Pain;Difficulty in walking, not elsewhere classified (R26.2) Pain - part of body: (neck, shoulders)    Time: 7408-1448 PT Time Calculation (min) (ACUTE ONLY): 21 min   Charges:   PT Evaluation $PT Eval Moderate Complexity: 1 Mod         .  cht  Carloine Fabiola Backer 04/08/2019, 1:49 PM

## 2019-04-08 NOTE — Progress Notes (Signed)
[facility] Progress Note   Subjective:   Patient seen in room , no dialysis issues.   Objective Vitals:   04/07/19 2348 04/08/19 0355 04/08/19 0818 04/08/19 1208  BP: (!) 149/74 (!) 154/72 (!) 156/79 (!) 167/87  Pulse: 73 69 74 94  Resp: 12 14 14  (!) 26  Temp: 97.7 F (36.5 C) 97.7 F (36.5 C) 97.6 F (36.4 C) 97.9 F (36.6 C)  TempSrc: Oral Oral Oral Oral  SpO2: 97% 97% 98% 97%  Weight:      Height:       Physical Exam General:NAD, chronically ill appearing male, laying in bed Heart:RRRc Lungs:CTAB Abdomen:soft, NTND Extremities:no LE edema Dialysis Access: LU AVF   Filed Weights   04/04/19 1347 04/04/19 1738 04/05/19 0953  Weight: 66.6 kg 64.6 kg 64.6 kg    Intake/Output Summary (Last 24 hours) at 04/08/2019 1221 Last data filed at 04/08/2019 0955 Gross per 24 hour  Intake 240 ml  Output 1500 ml  Net -1260 ml    Additional Objective Labs: Basic Metabolic Panel: Recent Labs  Lab 04/02/19 0405  04/04/19 1313  04/06/19 1106 04/07/19 0106 04/08/19 0531  NA  --    < > 133*   < > 136 134* 133*  K  --    < > 5.3*   < > 5.0 5.4* 4.5  CL  --    < > 95*  --  98 96* 95*  CO2  --    < > 21*  --  20* 21* 25  GLUCOSE  --    < > 120*   < > 149* 140* 131*  BUN  --    < > 45*  --  60* 72* 44*  CREATININE  --    < > 7.82*  --  7.55* 8.50* 6.15*  CALCIUM  --    < > 8.5*  --  7.3* 7.0* 7.0*  PHOS 7.8*  --  6.5*  --   --   --   --    < > = values in this interval not displayed.   Liver Function Tests: Recent Labs  Lab 04/03/19 0652 04/04/19 1313 04/07/19 0106 04/08/19 0531  AST 24  --  24 20  ALT 18  --  12 7  ALKPHOS 201*  --  157* 141*  BILITOT 0.9  --  0.8 1.2  PROT 6.6  --  6.0* 5.7*  ALBUMIN 3.2* 3.7 3.1* 2.9*   No results for input(s): LIPASE, AMYLASE in the last 168 hours. CBC: Recent Labs  Lab 04/03/19 0652 04/04/19 0733  04/06/19 1106 04/07/19 0106 04/08/19 0531  WBC 3.1* 6.3  --  6.8 6.9 7.2  NEUTROABS  --   --   --  5.8 5.7  6.2  HGB 13.2 13.4   < > 11.3* 10.9* 10.4*  HCT 40.0 41.2   < > 34.2* 33.1* 31.9*  MCV 97.8 98.8  --  98.8 99.1 101.3*  PLT 128* 127*  --  86* 89* 94*   < > = values in this interval not displayed.   Blood Culture    Component Value Date/Time   SDES  05/03/2018 1345    PLEURAL Performed at [facility], [address] [place], [place] [address]    [facility]  05/03/2018 1345    NONE Performed at [facility], [address] [place], [place] [address]    CULT  05/03/2018 1345    NO GROWTH 3 DAYS Performed at  [facility], [place] [address] [place],  [address]    REPTSTATUS 05/07/2018 FINAL 05/03/2018 1345    Cardiac Enzymes: No results for input(s): CKTOTAL, CKMB, CKMBINDEX, TROPONINI in the last 168 hours. CBG: Recent Labs  Lab 04/03/19 1648  GLUCAP 157*   Iron Studies: No results for input(s): IRON, TIBC, TRANSFERRIN, FERRITIN in the last 72 hours. Lab Results  Component Value Date   INR 1.2 04/03/2019   INR 1.17 01/22/2018   INR 1.19 02/25/2017   Studies/Results: No results found.  Medications: . sodium chloride     . amLODipine  10 mg Oral Daily  . Chlorhexidine Gluconate Cloth  6 each Topical Q0600  . Chlorhexidine Gluconate Cloth  6 each Topical Q0600  . cyclobenzaprine  10 mg Oral TID  . dexamethasone (DECADRON) injection  4 mg Intravenous Q6H  . famotidine  20 mg Oral Daily  . heparin  5,000 Units Subcutaneous Q8H  . sodium chloride flush  3 mL Intravenous Q12H    Dialysis Orders: [facility]  HD TTS / [employer] 3h  2/ 2.5Ca    800/450   65kg   Hep none  L AVF No Heparin hectorol 60mcg qHD   Assessment/Plan: 1. Neck pain/cervical myelopathy - SP post cervical fusion C5-C7 2.  ESRD -  On HD TTS.  Next HD Sat.  3.  Hypertension/volume  - BP good. No vol^ on exam. At dry wt.  4.  Anemia of CKD - Hgb 13.2. No indication for ESA. 5.  Secondary Hyperparathyroidism -  Ca at goal. Phos elevated.  Continue binders. 6.  Nutrition - Renal diet w/fluid restrictions 7. Hx CHF - Echo ordered 8. Alcoholic Cirrhosis   Kelly Splinter, MD 04/08/2019, 12:21 PM

## 2019-04-08 NOTE — Progress Notes (Signed)
Rehab Admissions Coordinator Note:  Per OT recommendation, patient was screened by Michel Santee for appropriateness for an Inpatient Acute Rehab Consult.  At this time, we are recommending Inpatient Rehab consult.  Please place a consult order if pt would like to be considered.   Michel Santee 04/08/2019, 1:09 PM  I can be reached at [phone].

## 2019-04-08 NOTE — Evaluation (Signed)
Occupational Therapy Evaluation Patient Details Name: Mario [name] MRN: [id number] DOB: [birth year] Today's Date: 04/08/2019    History of Present Illness 48 yo male s/p cervical fusion C5-C7  C6-7 fx with severe spinal stenosis with cord compression pMH: alcoholic cirrhosis, ERSD, HTN, CHF   Clinical Impression   Patient is s/p C5-7  surgery resulting in functional limitations (cord compression) due to the deficits listed below (see OT problem list). Pt noted to have decreased balance and activity tolerance. Pt must be a mod I level to dc home safely at this time. Recommend 7-14 days to progress to MOD I level.  Patient will benefit from skilled OT acutely to increase independence and safety with ADLS to allow discharge cir.     Follow Up Recommendations  CIR    Equipment Recommendations  3 in 1 bedside commode    Recommendations for Other Services Rehab consult     Precautions / Restrictions Precautions Precautions: Cervical Precaution Comments: pt is unable to read english or spanish so all education will need to be provided in great detail to ensure learning. spoke with RN and advised that family be present for any discharge instructions.  Required Braces or Orthoses: Cervical Brace Cervical Brace: Hard collar;At all times      Mobility Bed Mobility               General bed mobility comments: on eob on arrival  Transfers Overall transfer level: Needs assistance Equipment used: Rolling walker (2 wheeled) Transfers: Sit to/from Stand Sit to Stand: Min assist         General transfer comment: pt holding onto RW for transfer. pt needs cues for safety with UE use     Balance Overall balance assessment: Mild deficits observed, not formally tested                                         ADL either performed or assessed with clinical judgement   ADL Overall ADL's : Needs assistance/impaired Eating/Feeding: Set up;Sitting    Grooming: Wash/dry hands;Supervision/safety   Upper Body Bathing: Supervision/ safety   Lower Body Bathing: Minimal assistance           Toilet Transfer: Minimal assistance;RW Toilet Transfer Details (indicate cue type and reason): heavy reliance on RW         Functional mobility during ADLs: Minimal assistance;Rolling walker General ADL Comments: pt heavy reliance on RW and scissoring gait every few steps. pt reports he will be home by himself     Vision Baseline Vision/History: No visual deficits       Perception     Praxis      Pertinent Vitals/Pain Pain Assessment: Faces Faces Pain Scale: Hurts little more Pain Location: neck Pain Descriptors / Indicators: Operative site guarding Pain Intervention(s): Repositioned;Monitored during session     Hand Dominance Right   Extremity/Trunk Assessment Upper Extremity Assessment Upper Extremity Assessment: Generalized weakness   Lower Extremity Assessment Lower Extremity Assessment: Defer to PT evaluation   Cervical / Trunk Assessment Cervical / Trunk Assessment: Other exceptions Cervical / Trunk Exceptions: s/p surg   Communication Communication Communication: Prefers language other than English(spanish)   Cognition Arousal/Alertness: Awake/alert Behavior During Therapy: Agitated Overall Cognitive Status: Impaired/Different from baseline Area of Impairment: Memory                     Memory:  Decreased recall of precautions         General Comments: pt upset about food options and stating no one is listening to him. OT spoke with MD and Rn and diet is now changed to regular diet. OT calling the dietary services to help order lunch and breakfast to include a bagel whichis patients request.    General Comments  educated in detail for cervical precautions and aspen collar don doff , change of pads, use of [place] collar for shower transfer if crossing the tub     Exercises     Shoulder  Instructions      Home Living Family/patient expects to be discharged to:: Private residence Living Arrangements: Alone Available Help at Discharge: Friend(s);Available PRN/intermittently Type of Home: Mobile home Home Access: Stairs to enter Entrance Stairs-Number of Steps: 3   Home Layout: One level     Bathroom Shower/Tub: Teacher, early years/pre: Standard     Home Equipment: Crutches   Additional Comments: will be home alone but lives with brother/ cousin      Prior Functioning/Environment Level of Independence: Independent        Comments: Reports 1 fall in past 4 weeks, friend drives him to dialysis        OT Problem List: Decreased activity tolerance;Decreased coordination;Decreased knowledge of precautions;Decreased knowledge of use of DME or AE;Decreased safety awareness;Decreased strength;Impaired balance (sitting and/or standing);Pain;Impaired UE functional use      OT Treatment/Interventions: Self-care/ADL training;Therapeutic exercise;Therapeutic activities;Neuromuscular education;DME and/or AE instruction;Manual therapy;Modalities;Cognitive remediation/compensation;Patient/family education;Balance training    OT Goals(Current goals can be found in the care plan section) Acute Rehab OT Goals Patient Stated Goal: to get food so i can get my strength back OT Goal Formulation: With patient Time For Goal Achievement: 04/22/19 Potential to Achieve Goals: Good  OT Frequency: Min 3X/week   Barriers to D/C:    will be home alone       Co-evaluation              AM-PAC OT "6 Clicks" Daily Activity     Outcome Measure Help from another person eating meals?: A Little Help from another person taking care of personal grooming?: A Little Help from another person toileting, which includes using toliet, bedpan, or urinal?: A Lot Help from another person bathing (including washing, rinsing, drying)?: A Lot Help from another person to put on and  taking off regular upper body clothing?: A Little Help from another person to put on and taking off regular lower body clothing?: A Lot 6 Click Score: 15   End of Session Equipment Utilized During Treatment: Gait belt;Cervical collar Nurse Communication: Mobility status;Precautions  Activity Tolerance: Patient tolerated treatment well Patient left: in chair;with call bell/phone within reach;with chair alarm set  OT Visit Diagnosis: Unsteadiness on feet (R26.81);Muscle weakness (generalized) (M62.81)                Time: 6606-3016 OT Time Calculation (min): 34 min Charges:  OT General Charges $OT Visit: 1 Visit OT Evaluation $OT Eval Moderate Complexity: 1 Mod      Brynn Deedra Pro 04/08/2019, 1:07 PM

## 2019-04-08 NOTE — Progress Notes (Signed)
Postop day 3.  No significant change in status.  Patient still complains of incisional pain.  Moving all extremities well.  Strength good bilaterally.  Wounds clean and dry.  Overall progressing as would be expected.  Continue efforts at mobilization and therapy.

## 2019-04-09 LAB — CBC WITH DIFFERENTIAL/PLATELET
Abs Immature Granulocytes: 0.26 10*3/uL — ABNORMAL HIGH (ref 0.00–0.07)
Basophils Absolute: 0 10*3/uL (ref 0.0–0.1)
Basophils Relative: 0 %
Eosinophils Absolute: 0 10*3/uL (ref 0.0–0.5)
Eosinophils Relative: 0 %
HCT: 33.5 % — ABNORMAL LOW (ref 39.0–52.0)
Hemoglobin: 10.8 g/dL — ABNORMAL LOW (ref 13.0–17.0)
Immature Granulocytes: 4 %
Lymphocytes Relative: 6 %
Lymphs Abs: 0.4 10*3/uL — ABNORMAL LOW (ref 0.7–4.0)
MCH: 32.5 pg (ref 26.0–34.0)
MCHC: 32.2 g/dL (ref 30.0–36.0)
MCV: 100.9 fL — ABNORMAL HIGH (ref 80.0–100.0)
Monocytes Absolute: 0.4 10*3/uL (ref 0.1–1.0)
Monocytes Relative: 6 %
Neutro Abs: 5.5 10*3/uL (ref 1.7–7.7)
Neutrophils Relative %: 84 %
Platelets: 105 10*3/uL — ABNORMAL LOW (ref 150–400)
RBC: 3.32 MIL/uL — ABNORMAL LOW (ref 4.22–5.81)
RDW: 14.4 % (ref 11.5–15.5)
WBC: 6.5 10*3/uL (ref 4.0–10.5)
nRBC: 0 % (ref 0.0–0.2)

## 2019-04-09 LAB — COMPREHENSIVE METABOLIC PANEL
ALT: 8 U/L (ref 0–44)
AST: 25 U/L (ref 15–41)
Albumin: 3 g/dL — ABNORMAL LOW (ref 3.5–5.0)
Alkaline Phosphatase: 156 U/L — ABNORMAL HIGH (ref 38–126)
Anion gap: 19 — ABNORMAL HIGH (ref 5–15)
BUN: 71 mg/dL — ABNORMAL HIGH (ref 6–20)
CO2: 23 mmol/L (ref 22–32)
Calcium: 7.2 mg/dL — ABNORMAL LOW (ref 8.9–10.3)
Chloride: 92 mmol/L — ABNORMAL LOW (ref 98–111)
Creatinine, Ser: 7.92 mg/dL — ABNORMAL HIGH (ref 0.61–1.24)
GFR calc Af Amer: 8 mL/min — ABNORMAL LOW (ref >60)
GFR calc non Af Amer: 7 mL/min — ABNORMAL LOW (ref >60)
Glucose, Bld: 134 mg/dL — ABNORMAL HIGH (ref 70–99)
Potassium: 5.5 mmol/L — ABNORMAL HIGH (ref 3.5–5.1)
Sodium: 134 mmol/L — ABNORMAL LOW (ref 135–145)
Total Bilirubin: 1 mg/dL (ref 0.3–1.2)
Total Protein: 6 g/dL — ABNORMAL LOW (ref 6.5–8.1)

## 2019-04-09 LAB — MAGNESIUM: Magnesium: 2.6 mg/dL — ABNORMAL HIGH (ref 1.7–2.4)

## 2019-04-09 MED ORDER — PRO-STAT SUGAR FREE PO LIQD
30.0000 mL | Freq: Two times a day (BID) | ORAL | Status: DC
  Administered 2019-04-09 – 2019-04-11 (×2): 30 mL via ORAL
  Filled 2019-04-09 (×6): qty 30

## 2019-04-09 NOTE — Progress Notes (Signed)
Physical Therapy Treatment Patient Details Name: Mario [name] MRN: [id number] DOB: 1970/12/10 Today's Date: 04/09/2019    History of Present Illness 48 yo male s/p cervical fusion C5-C7  C6-7 fx with severe spinal stenosis with cord compression pMH: alcoholic cirrhosis, ERSD, HTN, CHF    PT Comments    Patient seen for mobility progression.  Received from OT, patient tolerated ambulation but during attempts with mobility and no AD, patient with noted instability and decreased tolerance. Currently feel patient would benefit from further services to reinforce precautions and maximize recovery of function and stability. Patient remains high fall risk.  Current POC remains appropriate.   Follow Up Recommendations  CIR     Equipment Recommendations  Rolling walker with 5" wheels;3in1 (PT)    Recommendations for Other Services Rehab consult     Precautions / Restrictions Precautions Precautions: Cervical Precaution Comments: pt is unable to read english or spanish so all education will need to be provided in great detail to ensure learning. spoke with RN and advised that family be present for any discharge instructions.  Required Braces or Orthoses: Cervical Brace Cervical Brace: Hard collar;At all times Restrictions Weight Bearing Restrictions: No    Mobility  Bed Mobility Overal bed mobility: Needs Assistance Bed Mobility: Rolling;Sit to Sidelying Rolling: Supervision Sidelying to sit: Supervision     Sit to sidelying: Supervision General bed mobility comments: Vcs for positioning  Transfers Overall transfer level: Needs assistance Equipment used: Rolling walker (2 wheeled) Transfers: Sit to/from Stand Sit to Stand: Min assist         General transfer comment: Vcs for handplacement and positioning, min assist for stability  Ambulation/Gait Ambulation/Gait assistance: Min assist Gait Distance (Feet): 110 Feet Assistive device: Rolling walker (2  wheeled);1 person hand held assist   Gait velocity: decreased Gait velocity interpretation: <1.8 ft/sec, indicate of risk for recurrent falls General Gait Details: Min assist for stability with HHA, max multi modal cues for posture and positioning. Min guard with RW but limited endurance.   Stairs             Wheelchair Mobility    Modified Rankin (Stroke Patients Only)       Balance Overall balance assessment: Needs assistance Sitting-balance support: Feet supported Sitting balance-Leahy Scale: Good     Standing balance support: Bilateral upper extremity supported;During functional activity;No upper extremity supported Standing balance-Leahy Scale: Poor Standing balance comment: Requires UE support for stability                            Cognition Arousal/Alertness: Awake/alert Behavior During Therapy: Flat affect;Impulsive Overall Cognitive Status: Impaired/Different from baseline Area of Impairment: Memory;Safety/judgement;Problem solving                     Memory: Decreased recall of precautions   Safety/Judgement: Decreased awareness of safety;Decreased awareness of deficits   Problem Solving: Requires verbal cues General Comments: cueing to recall precautions, adhere to during functional tasks       Exercises      General Comments        Pertinent Vitals/Pain Pain Assessment: 0-10 Faces Pain Scale: Hurts whole lot Pain Location: neck, R shoulder blade Pain Descriptors / Indicators: Operative site guarding;Discomfort;Sore Pain Intervention(s): Monitored during session    Home Living                      Prior Function  PT Goals (current goals can now be found in the care plan section) Acute Rehab PT Goals Patient Stated Goal: none stated today PT Goal Formulation: With patient Time For Goal Achievement: 04/22/19 Potential to Achieve Goals: Good Progress towards PT goals: Progressing toward goals     Frequency    Min 5X/week      PT Plan Current plan remains appropriate    Co-evaluation              AM-PAC PT "6 Clicks" Mobility   Outcome Measure  Help needed turning from your back to your side while in a flat bed without using bedrails?: None Help needed moving from lying on your back to sitting on the side of a flat bed without using bedrails?: None Help needed moving to and from a bed to a chair (including a wheelchair)?: A Little Help needed standing up from a chair using your arms (e.g., wheelchair or bedside chair)?: A Little Help needed to walk in hospital room?: A Little Help needed climbing 3-5 steps with a railing? : A Lot 6 Click Score: 19    End of Session Equipment Utilized During Treatment: Cervical collar Activity Tolerance: Patient tolerated treatment well Patient left: in bed;with call bell/phone within reach;Other (comment) Nurse Communication: Mobility status PT Visit Diagnosis: Unsteadiness on feet (R26.81);Other abnormalities of gait and mobility (R26.89);Pain;Difficulty in walking, not elsewhere classified (R26.2) Pain - part of body: (neck, shoulders)     Time: 0814-4818 PT Time Calculation (min) (ACUTE ONLY): 12 min  Charges:  $Gait Training: 8-22 mins                     Mario [name], PT DPT  Board Certified Neurologic Specialist [place] Pager [phone] Office [phone]    Mario [name] 04/09/2019, 10:52 AM

## 2019-04-09 NOTE — Progress Notes (Signed)
Occupational Therapy Treatment Patient Details Name: Mario [name] MRN: [id number] DOB: [birth year] Today's Date: 04/09/2019    History of present illness 48 yo male s/p cervical fusion C5-C7  C6-7 fx with severe spinal stenosis with cord compression pMH: alcoholic cirrhosis, ERSD, HTN, CHF   OT comments  Patient supine in bed and agreeable to OT.  Interpreter, [place], used throughout session.  He is able to recall log roll technique for bed mobility, but requires cueing for remainder of cervical precautions. Maximal cueing for posture throughout session, ADL compensatory techniques and safety.  Demonstrated LB dressing with min assist using figure 4 technique, grooming at sink with min guard for balance given 0 hand support.  Fatigues easily. Plan for next session to focus on further precaution adherence, posture, and collar mgmt.    Follow Up Recommendations  CIR    Equipment Recommendations  3 in 1 bedside commode    Recommendations for Other Services Rehab consult    Precautions / Restrictions Precautions Precautions: Cervical Precaution Comments: pt is unable to read english or spanish so all education will need to be provided in great detail to ensure learning. spoke with RN and advised that family be present for any discharge instructions.  Required Braces or Orthoses: Cervical Brace Cervical Brace: Hard collar;At all times Restrictions Weight Bearing Restrictions: No       Mobility Bed Mobility Overal bed mobility: Needs Assistance Bed Mobility: Rolling;Sidelying to Sit Rolling: Supervision Sidelying to sit: Supervision       General bed mobility comments: cueing for log roll technique  Transfers Overall transfer level: Needs assistance Equipment used: Rolling walker (2 wheeled) Transfers: Sit to/from Stand Sit to Stand: Min assist         General transfer comment: requires cueing for hand placement, redirection to attend to cueing, safety, posture  and precaution adherence    Balance Overall balance assessment: Needs assistance Sitting-balance support: Feet supported Sitting balance-Leahy Scale: Good     Standing balance support: Bilateral upper extremity supported;During functional activity;No upper extremity supported Standing balance-Leahy Scale: Poor Standing balance comment: reliant on UE support, requires min guard during grooming tasks                            ADL either performed or assessed with clinical judgement   ADL Overall ADL's : Needs assistance/impaired     Grooming: Oral care;Min guard;Standing Grooming Details (indicate cue type and reason): min guard for balance, cueing for compensatory techniques and posture throughout task              Lower Body Dressing: Sit to/from stand;Minimal assistance Lower Body Dressing Details (indicate cue type and reason): able to complete figure 4 technique to don/doff socks, min assis sit<>stand  Toilet Transfer: Minimal assistance;RW Toilet Transfer Details (indicate cue type and reason): heavy reliance on RW, simulated in room         Functional mobility during ADLs: Minimal assistance;Rolling walker General ADL Comments: pt relaint on RW      Vision       Perception     Praxis      Cognition Arousal/Alertness: Awake/alert Behavior During Therapy: Flat affect;Impulsive Overall Cognitive Status: Impaired/Different from baseline Area of Impairment: Memory;Safety/judgement;Problem solving                     Memory: Decreased recall of precautions   Safety/Judgement: Decreased awareness of safety;Decreased awareness of deficits  Problem Solving: Requires verbal cues General Comments: cueing to recall precautions, adhere to during functional tasks         Exercises     Shoulder Instructions       General Comments      Pertinent Vitals/ Pain       Pain Assessment: Faces Faces Pain Scale: Hurts little more Pain  Location: neck, R shoulder blade Pain Descriptors / Indicators: Operative site guarding;Discomfort;Sore Pain Intervention(s): Repositioned;Monitored during session  Home Living                                          Prior Functioning/Environment              Frequency  Min 3X/week        Progress Toward Goals  OT Goals(current goals can now be found in the care plan section)  Progress towards OT goals: Progressing toward goals  Acute Rehab OT Goals Patient Stated Goal: none stated today  Plan Discharge plan remains appropriate;Frequency remains appropriate    Co-evaluation                 AM-PAC OT "6 Clicks" Daily Activity     Outcome Measure   Help from another person eating meals?: A Little Help from another person taking care of personal grooming?: A Little Help from another person toileting, which includes using toliet, bedpan, or urinal?: A Lot Help from another person bathing (including washing, rinsing, drying)?: A Little Help from another person to put on and taking off regular upper body clothing?: A Little Help from another person to put on and taking off regular lower body clothing?: A Little 6 Click Score: 17    End of Session Equipment Utilized During Treatment: Rolling walker;Cervical collar  OT Visit Diagnosis: Unsteadiness on feet (R26.81);Muscle weakness (generalized) (M62.81)   Activity Tolerance Patient tolerated treatment well   Patient Left Other (comment)(handoff to PT )   Nurse Communication Mobility status;Precautions        Time: 3754-3606 OT Time Calculation (min): 11 min  Charges: OT General Charges $OT Visit: 1 Visit OT Treatments $Self Care/Home Management : 8-22 mins  Delight Stare, [place] Pager [phone] Office [phone]    Delight Stare 04/09/2019, 10:15 AM

## 2019-04-09 NOTE — Progress Notes (Signed)
PROGRESS NOTE   Mario [name]  HUT:[id number]    DOB: 1971-03-29    DOA: 04/03/2019  PCP: [name], MD   I have briefly reviewed patients previous medical records in [facility].  Chief complaint Neck pain with radiation to arms and associated weakness.  Brief Narrative:  48 year old Spanish-speaking male, lives alone, independent, PMH of ESRD on TTS HD via LUE AVF, chronic combined systolic and diastolic CHF, LVEF 65% by TTE in 2017, mild mitral regurgitation, cirrhosis, HTN, initially admitted to [facility] on 04/01/2019 due to progressively worsening neck/upper back pain with radiation to bilateral upper extremity with associated tingling, numbness and upper extremity weakness, left> right.  CT cervical spine showed erosive changes compatible with renal spondyloarthropathy at C3-4, C6-7, C7-T1.  Patient was transferred to [facility] for Neurosurgical consultation.  Better after steroids and cervical collar.  Neurosurgery requested preop cardiology clearance which was provided by cardiology.  Patient was dialyzed before the surgery and then on 04/05/2019 he underwent anterior cervical corpectomy, microdissection, posterior cervical fusion.   Assessment & Plan:   Principal Problem:   Cervical radiculopathy Active Problems:   ESRD (end stage renal disease) on dialysis (HCC)   HTN (hypertension)   Neck pain   C6 and 7 pathological fracture with severe spinal stenosis with cord compression and cervical myelopathy due to renal osteodystrophy  CT cervical spine at OSH showed erosive changes compatible with renal spondyloarthropathy at C3-4, C6-7, C7-T1.  Continues to have cervical collar and dexamethasone 4 mg IV every 6 hourly.  Continue Pepcid. Postoperative day 1 status post anterior cervical corpectomy, microdissection, posterior cervical fusion.  Pain controlled.  Continue current pain medications.  Good strength in all extremities.  Further management per neurosurgery.   Appreciate their help.   ESRD on TTS HD  Had HD at OSH on 8/29.  Nephrology consulted for HD needs.  Will be on TTS schedule.  Secondary hyperparathyroidism  Management per nephrology.  Tobacco abuse  Cessation counseled.  Patient declines nicotine patch.  Chronic combined systolic and diastolic CHF  Last TTE 01/09/1274 showed LVEF 40% with hypokinesis of anteroseptal myocardium and grade 1 diastolic dysfunction.  Mild MR, moderate TR, moderate pulmonary hypertension.  Clinically euvolemic.  Volume management across HD.  Repeat TTE shows normal EF and no MR.  As stated above, requested preop cardiology clearance.  Chest pain/slightly elevated troponin  Apparently, he complained of some chest pain late afternoon of 04/06/2019 and telemetry nurses had noted ST elevation.  A stat EKG was done which did not show any ST-T wave changes.  Troponins were followed once again and they remained flat once again.  Patient might have had referred anterior neck pain to the chest.  He did not have any chest pain this morning when I saw him.  Cirrhosis/splenomegaly  Noted on abdominal ultrasound 05/02/2018.  Had prior paracentesis 04/2018.  Quit alcohol intake 3 years ago.  Currently without clinical ascites.  INR 1.2 on 8/30.  Essential hypertension  Fairly controlled except some mild elevation this morning which might be due to exertion while he was working with physical therapy.  Continue PRN hydralazine.  Thrombocytopenia  Appears chronic and has been dropping with 86 on 04/06/2019 and improving and now 94.  No signs of bleeding.  Watch closely while he is on subcutaneous heparin DVT prophylaxis dose.  Hypokalemia: Very mild 5.5.  Due to being HD patient, will defer to nephrology.  DVT prophylaxis: Subcutaneous heparin Code Status: Full Family Communication: None at bedside.  Disposition: CIR when things arranged.   Consultants:  Neurosurgery Nephrology Cardiology  Procedures:   Cervical collar anterior cervical corpectomy, microdissection, posterior cervical fusion on 04/05/2019.  Antimicrobials:  None   Subjective: Patient seen and examined.  He was walking with the physical therapy.  Complained of bilateral shoulder pain and anterior neck pain but this is better than yesterday.  Improving slowly.  No other complaint.  Objective:  Vitals:   04/08/19 2030 04/08/19 2340 04/09/19 0340 04/09/19 0811  BP: (!) 143/70 140/70 (!) 147/78 (!) 198/87  Pulse: 72 69 67 91  Resp: 10 15 13 12   Temp: 97.9 F (36.6 C) 97.9 F (36.6 C) 97.7 F (36.5 C) (!) 97.5 F (36.4 C)  TempSrc: Oral Oral Oral Axillary  SpO2: 96% 98% 98% 100%  Weight:      Height:        Examination:  General exam: Appears calm and comfortable  Respiratory system: Clear to auscultation. Respiratory effort normal. Cardiovascular system: S1 & S2 heard, RRR. No JVD, murmurs, rubs, gallops or clicks. No pedal edema. Gastrointestinal system: Abdomen is nondistended, soft and nontender. No organomegaly or masses felt. Normal bowel sounds heard. Central nervous system: Alert and oriented. No focal neurological deficits. Extremities: Symmetric 5 x 5 power. Skin: No rashes, lesions or ulcers.  Psychiatry: Judgement and insight appear normal. Mood & affect appropriate.   Data Reviewed: I have personally reviewed following labs and imaging studies  CBC: Recent Labs  Lab 04/04/19 0733 04/05/19 0958 04/06/19 1106 04/07/19 0106 04/08/19 0531 04/09/19 0527  WBC 6.3  --  6.8 6.9 7.2 6.5  NEUTROABS  --   --  5.8 5.7 6.2 5.5  HGB 13.4 13.9 11.3* 10.9* 10.4* 10.8*  HCT 41.2 41.0 34.2* 33.1* 31.9* 33.5*  MCV 98.8  --  98.8 99.1 101.3* 100.9*  PLT 127*  --  86* 89* 94* 850*   Basic Metabolic Panel: Recent Labs  Lab 04/04/19 1313 04/05/19 0958 04/06/19 1106 04/07/19 0106 04/08/19 0531 04/09/19 0527  NA 133* 136 136 134* 133* 134*  K 5.3* 4.2 5.0 5.4* 4.5 5.5*  CL 95*  --  98 96* 95* 92*   CO2 21*  --  20* 21* 25 23  GLUCOSE 120* 108* 149* 140* 131* 134*  BUN 45*  --  60* 72* 44* 71*  CREATININE 7.82*  --  7.55* 8.50* 6.15* 7.92*  CALCIUM 8.5*  --  7.3* 7.0* 7.0* 7.2*  MG  --   --   --   --  2.4 2.6*  PHOS 6.5*  --   --   --   --   --    Liver Function Tests: Recent Labs  Lab 04/03/19 0652 04/04/19 1313 04/07/19 0106 04/08/19 0531 04/09/19 0527  AST 24  --  24 20 25   ALT 18  --  12 7 8   ALKPHOS 201*  --  157* 141* 156*  BILITOT 0.9  --  0.8 1.2 1.0  PROT 6.6  --  6.0* 5.7* 6.0*  ALBUMIN 3.2* 3.7 3.1* 2.9* 3.0*    Cardiac Enzymes: No results for input(s): CKTOTAL, CKMB, CKMBINDEX, TROPONINI in the last 168 hours.  CBG: Recent Labs  Lab 04/03/19 1648  GLUCAP 157*    Recent Results (from the past 240 hour(s))  SARS Coronavirus 2 [facility] order, Performed in [facility] hospital lab) Nasopharyngeal Nasopharyngeal Swab     Status: None   Collection Time: 04/01/19 11:15 AM   Specimen: Nasopharyngeal Swab  Result Value Ref  Range Status   SARS Coronavirus 2 NEGATIVE NEGATIVE Final    Comment: (NOTE) If result is NEGATIVE SARS-CoV-2 target nucleic acids are NOT DETECTED. The SARS-CoV-2 RNA is generally detectable in upper and lower  respiratory specimens during the acute phase of infection. The lowest  concentration of SARS-CoV-2 viral copies this assay can detect is 250  copies / mL. A negative result does not preclude SARS-CoV-2 infection  and should not be used as the sole basis for treatment or other  patient management decisions.  A negative result may occur with  improper specimen collection / handling, submission of specimen other  than nasopharyngeal swab, presence of viral mutation(s) within the  areas targeted by this assay, and inadequate number of viral copies  (<250 copies / mL). A negative result must be combined with clinical  observations, patient history, and epidemiological information. If result is POSITIVE SARS-CoV-2 target nucleic  acids are DETECTED. The SARS-CoV-2 RNA is generally detectable in upper and lower  respiratory specimens dur ing the acute phase of infection.  Positive  results are indicative of active infection with SARS-CoV-2.  Clinical  correlation with patient history and other diagnostic information is  necessary to determine patient infection status.  Positive results do  not rule out bacterial infection or co-infection with other viruses. If result is PRESUMPTIVE POSTIVE SARS-CoV-2 nucleic acids MAY BE PRESENT.   A presumptive positive result was obtained on the submitted specimen  and confirmed on repeat testing.  While 2019 novel coronavirus  (SARS-CoV-2) nucleic acids may be present in the submitted sample  additional confirmatory testing may be necessary for epidemiological  and / or clinical management purposes  to differentiate between  SARS-CoV-2 and other Sarbecovirus currently known to infect humans.  If clinically indicated additional testing with an alternate test  methodology [phone]) is advised. The SARS-CoV-2 RNA is generally  detectable in upper and lower respiratory sp ecimens during the acute  phase of infection. The expected result is Negative. Fact Sheet for Patients:  StrictlyIdeas.no Fact Sheet for Healthcare Providers: BankingDealers.co.za This test is not yet approved or cleared by the [place] FDA and has been authorized for detection and/or diagnosis of SARS-CoV-2 by FDA under an Emergency Use Authorization (EUA).  This EUA will remain in effect (meaning this test can be used) for the duration of the COVID-19 declaration under Section 564(b)(1) of the Act, 21 U.S.C. section 360bbb-3(b)(1), unless the authorization is terminated or revoked sooner. Performed at [facility], [address] [place], [place] [address]   MRSA PCR Screening     Status: None   Collection Time: 04/03/19  6:32 AM   Specimen:  Nasal Mucosa; Nasopharyngeal  Result Value Ref Range Status   MRSA by PCR NEGATIVE NEGATIVE Final    Comment:        The GeneXpert MRSA Assay (FDA approved for NASAL specimens only), is one component of a comprehensive MRSA colonization surveillance program. It is not intended to diagnose MRSA infection nor to guide or monitor treatment for MRSA infections. Performed at [facility], [place] [address], [place],  [address]          Radiology Studies: No results found.      Scheduled Meds: . amLODipine  10 mg Oral Daily  . Chlorhexidine Gluconate Cloth  6 each Topical Q0600  . cyclobenzaprine  10 mg Oral TID  . dexamethasone (DECADRON) injection  4 mg Intravenous Q6H  . famotidine  20 mg Oral Daily  .  heparin  5,000 Units Subcutaneous Q8H  . sodium chloride flush  3 mL Intravenous Q12H   Continuous Infusions: . sodium chloride       LOS: 6 days     Darliss Cheney, MD,  [facility]  To contact the attending provider between 7A-7P or the covering provider during after hours 7P-7A, please log into the web site [email or web address] and access using universal [place] password for that web site. If you do not have the password, please call the hospital operator.  04/09/2019, 10:57 AM

## 2019-04-09 NOTE — Progress Notes (Addendum)
NEUROSURGERY PROGRESS NOTE  Doing well. Complains of appropriate neck soreness. No radicular symptoms  Temp:  [97.5 F (36.4 C)-97.9 F (36.6 C)] 97.5 F (36.4 C) (09/05 0811) Pulse Rate:  [67-94] 91 (09/05 0811) Resp:  [10-26] 12 (09/05 0811) BP: (136-198)/(67-87) 198/87 (09/05 0811) SpO2:  [96 %-100 %] 100 % (09/05 0811)  Plan: Continue therapies. Placed order for CIR consult per therapy recom.   Eleonore Chiquito, NP 04/09/2019 8:26 AM

## 2019-04-09 NOTE — Progress Notes (Addendum)
Mario [name] Progress Note   Subjective:   Seen in room. C/o neck and shoulder pain. No dyspnea. For HD today.  Objective Vitals:   04/08/19 2030 04/08/19 2340 04/09/19 0340 04/09/19 0811  BP: (!) 143/70 140/70 (!) 147/78 (!) 198/87  Pulse: 72 69 67 91  Resp: 10 15 13 12   Temp: 97.9 F (36.6 C) 97.9 F (36.6 C) 97.7 F (36.5 C) (!) 97.5 F (36.4 C)  TempSrc: Oral Oral Oral Axillary  SpO2: 96% 98% 98% 100%  Weight:      Height:       Physical Exam General: Well appearing man. Large neck brace. Room air. Heart: RRR; 2/6 SEM Lungs: CTAB Abdomen: soft, non-tender Extremities: No LE edema Dialysis Access:  L AVF, aneurysmal, + thrill  Additional Objective Labs: Basic Metabolic Panel: Recent Labs  Lab 04/04/19 1313  04/07/19 0106 04/08/19 0531 04/09/19 0527  NA 133*   < > 134* 133* 134*  K 5.3*   < > 5.4* 4.5 5.5*  CL 95*   < > 96* 95* 92*  CO2 21*   < > 21* 25 23  GLUCOSE 120*   < > 140* 131* 134*  BUN 45*   < > 72* 44* 71*  CREATININE 7.82*   < > 8.50* 6.15* 7.92*  CALCIUM 8.5*   < > 7.0* 7.0* 7.2*  PHOS 6.5*  --   --   --   --    < > = values in this interval not displayed.   Liver Function Tests: Recent Labs  Lab 04/07/19 0106 04/08/19 0531 04/09/19 0527  AST 24 20 25   ALT 12 7 8   ALKPHOS 157* 141* 156*  BILITOT 0.8 1.2 1.0  PROT 6.0* 5.7* 6.0*  ALBUMIN 3.1* 2.9* 3.0*   CBC: Recent Labs  Lab 04/04/19 0733  04/06/19 1106 04/07/19 0106 04/08/19 0531 04/09/19 0527  WBC 6.3  --  6.8 6.9 7.2 6.5  NEUTROABS  --    < > 5.8 5.7 6.2 5.5  HGB 13.4   < > 11.3* 10.9* 10.4* 10.8*  HCT 41.2   < > 34.2* 33.1* 31.9* 33.5*  MCV 98.8  --  98.8 99.1 101.3* 100.9*  PLT 127*  --  86* 89* 94* 105*   < > = values in this interval not displayed.   Medications: . sodium chloride     . amLODipine  10 mg Oral Daily  . Chlorhexidine Gluconate Cloth  6 each Topical Q0600  . cyclobenzaprine  10 mg Oral TID  . dexamethasone (DECADRON) injection  4 mg  Intravenous Q6H  . famotidine  20 mg Oral Daily  . heparin  5,000 Units Subcutaneous Q8H  . sodium chloride flush  3 mL Intravenous Q12H    Dialysis Orders: TTS at [facility]) 3hr, 450/800, 2K/2.5Ca, EDW 65kg, L AVF, no heparin - Hectoral 71mcg IV q HD  Assessment/Plan: 1. C6-C7 Fractures with myelopathy + spinal cord compression: S/p C5-C7 fusion by Dr. Annette Stable.  2. ESRD: Continue HD per TTS schedule - HD later today. No heparin. 3. HTN/volume: BP high - challenge EDW as tolerated. Continue home meds. 4. Anemia: Hgb 10.8 - follow without ESA. 5. Secondary hyperparathyroidism: CorrCa ok, Phos pending. ?Not on binders. 6. Nutrition: Alb low, add supplements 7. Hx CHF 8. EtOH cirrhosis.  Veneta Penton, PA-C 04/09/2019, 11:06 AM  [name] Pager: [phone]  Pt seen, examined and agree w A/P as above.  Rob Doctor, hospital  MD 04/09/2019, 12:47 PM

## 2019-04-10 LAB — GLUCOSE, CAPILLARY: Glucose-Capillary: 134 mg/dL — ABNORMAL HIGH (ref 70–99)

## 2019-04-10 LAB — HEPATITIS B SURFACE ANTIGEN: Hepatitis B Surface Ag: NEGATIVE

## 2019-04-10 MED ORDER — LISINOPRIL 20 MG PO TABS
20.0000 mg | ORAL_TABLET | Freq: Every day | ORAL | Status: DC
  Administered 2019-04-10 – 2019-04-13 (×4): 20 mg via ORAL
  Filled 2019-04-10 (×4): qty 1

## 2019-04-10 NOTE — Progress Notes (Signed)
Pt returned from dialysis in satisfactory condition ,no visible sign of distress noted. 1.5 liter was removed as per verbal report from dialysis nurse. Pt c/o neck pain, pain med administer as per prn order. Resting quietly in bed at this time.

## 2019-04-10 NOTE — Progress Notes (Signed)
   Providing Compassionate, Quality Care - Together   Subjective: Patient reports expected pain at neck and between shoulder blades. Nurse reports IV pain medication recently administered.  Objective: Vital signs in last 24 hours: Temp:  [97.5 F (36.4 C)-98.4 F (36.9 C)] 98.3 F (36.8 C) (09/06 0800) Pulse Rate:  [71-84] 82 (09/06 0800) Resp:  [11-19] 11 (09/06 0800) BP: (150-174)/(68-89) 174/76 (09/06 0800) SpO2:  [99 %-100 %] 100 % (09/06 0800) Weight:  [67.6 kg-69.1 kg] 67.6 kg (09/06 0510)  Intake/Output from previous day: 09/05 0701 - 09/06 0700 In: 480 [P.O.:480] Out: 1500  Intake/Output this shift: No intake/output data recorded.  Alert and oriented x 4 PERRLA Speech fluent, voice clear Neck soft MAE, Strength 4/5 BUE, 5/5 BLE Incision closed with Steri Strips; dried sanguinous drainage, not actively draining  Lab Results: Recent Labs    04/08/19 0531 04/09/19 0527  WBC 7.2 6.5  HGB 10.4* 10.8*  HCT 31.9* 33.5*  PLT 94* 105*   BMET Recent Labs    04/08/19 0531 04/09/19 0527  NA 133* 134*  K 4.5 5.5*  CL 95* 92*  CO2 25 23  GLUCOSE 131* 134*  BUN 44* 71*  CREATININE 6.15* 7.92*  CALCIUM 7.0* 7.2*    Studies/Results: No results found.  Assessment/Plan: Patient is 5 days s/p C6 and C7 corpectomy and microdissection, followed by C5-T1 anterior cervical fusion with interbody cage and locally harvested autograft.    LOS: 7 days    -Therapies recommending CIR -Continue to mobilize  Viona Gilmore, DNP, AGNP-C Nurse Practitioner  [facility] [place]. [address], [address], [place], [place] [address] P: [phone]    F: [phone]  04/10/2019, 8:54 AM

## 2019-04-10 NOTE — Progress Notes (Addendum)
[facility] Progress Note   Subjective:  Seen in room. Still with neck pain. No other complaints today. Had HD yesterday without complications.  Objective Vitals:   04/10/19 0500 04/10/19 0510 04/10/19 0630 04/10/19 0800  BP: (!) 159/70 (!) 153/76 (!) 172/89 (!) 174/76  Pulse: 71 72 75 82  Resp: 13 15 13 11   Temp:  97.9 F (36.6 C) (!) 97.5 F (36.4 C) 98.3 F (36.8 C)  TempSrc:  Oral Oral Oral  SpO2:  100% 100% 100%  Weight:  67.6 kg    Height:       Physical Exam General: Well appearing man. Large neck brace. Room air. Heart: RRR; 2/6 SEM Lungs: CTAB Abdomen: soft, non-tender Extremities: No LE edema Dialysis Access:  L AVF, aneurysmal, + thrill  Additional Objective Labs: Basic Metabolic Panel: Recent Labs  Lab 04/04/19 1313  04/07/19 0106 04/08/19 0531 04/09/19 0527  NA 133*   < > 134* 133* 134*  K 5.3*   < > 5.4* 4.5 5.5*  CL 95*   < > 96* 95* 92*  CO2 21*   < > 21* 25 23  GLUCOSE 120*   < > 140* 131* 134*  BUN 45*   < > 72* 44* 71*  CREATININE 7.82*   < > 8.50* 6.15* 7.92*  CALCIUM 8.5*   < > 7.0* 7.0* 7.2*  PHOS 6.5*  --   --   --   --    < > = values in this interval not displayed.   Liver Function Tests: Recent Labs  Lab 04/07/19 0106 04/08/19 0531 04/09/19 0527  AST 24 20 25   ALT 12 7 8   ALKPHOS 157* 141* 156*  BILITOT 0.8 1.2 1.0  PROT 6.0* 5.7* 6.0*  ALBUMIN 3.1* 2.9* 3.0*   CBC: Recent Labs  Lab 04/04/19 0733  04/06/19 1106 04/07/19 0106 04/08/19 0531 04/09/19 0527  WBC 6.3  --  6.8 6.9 7.2 6.5  NEUTROABS  --    < > 5.8 5.7 6.2 5.5  HGB 13.4   < > 11.3* 10.9* 10.4* 10.8*  HCT 41.2   < > 34.2* 33.1* 31.9* 33.5*  MCV 98.8  --  98.8 99.1 101.3* 100.9*  PLT 127*  --  86* 89* 94* 105*   < > = values in this interval not displayed.   CBG: Recent Labs  Lab 04/03/19 1648 04/10/19 0212  GLUCAP 157* 134*   Medications: . sodium chloride     . amLODipine  10 mg Oral Daily  . Chlorhexidine Gluconate Cloth  6  each Topical Q0600  . cyclobenzaprine  10 mg Oral TID  . dexamethasone (DECADRON) injection  4 mg Intravenous Q6H  . famotidine  20 mg Oral Daily  . feeding supplement (PRO-STAT SUGAR FREE 64)  30 mL Oral BID  . heparin  5,000 Units Subcutaneous Q8H  . lisinopril  20 mg Oral Daily  . sodium chloride flush  3 mL Intravenous Q12H    Dialysis Orders: TTS at [facility]) 3hr, 450/800, 2K/2.5Ca, EDW 65kg, L AVF, no heparin - Hectoral 32mcg IV q HD  Assessment/Plan: 1. C6-C7 Fractures with myelopathy + spinal cord compression: S/p C6-C7 corpectomy/ microdissection then C5-T1 fusion by Dr. Annette Stable on 04/05/19  2. ESRD: Continue HD per TTS schedule - next 9/8. No heparin. 3. HTN/volume: BP high - no edema, may be pain or steroid driven. Continue home meds - may need to add another agent. 4. Anemia: Hgb 10.8 -  follow without ESA. 5. Secondary hyperparathyroidism: CorrCa ok, Phos pending. ?Not on binders. 6. Nutrition: Alb low, continue supplements 7. Hx CHF 8. EtOH cirrhosis. 9. Debility - PT recommending CIR  Veneta Penton, PA-C 04/10/2019, 11:25 AM  [facility] Pager: [phone]  Pt seen, examined and agree w assess/plan as above with additions as indicated.  [facility] 04/10/2019, 12:21 PM

## 2019-04-10 NOTE — Progress Notes (Signed)
PROGRESS NOTE   Mario [name]  NTI:[id number]    DOB: [birth year]    DOA: 04/03/2019  PCP: [name], MD   I have briefly reviewed patients previous medical records in [facility].  Chief complaint Neck pain with radiation to arms and associated weakness.  Brief Narrative:  48 year old Spanish-speaking male, lives alone, independent, PMH of ESRD on TTS HD via LUE AVF, chronic combined systolic and diastolic CHF, LVEF 86% by TTE in 2017, mild mitral regurgitation, cirrhosis, HTN, initially admitted to [facility] on 04/01/2019 due to progressively worsening neck/upper back pain with radiation to bilateral upper extremity with associated tingling, numbness and upper extremity weakness, left> right.  CT cervical spine showed erosive changes compatible with renal spondyloarthropathy at C3-4, C6-7, C7-T1.  Patient was transferred to [facility] for Neurosurgical consultation.  Better after steroids and cervical collar.  Neurosurgery requested preop cardiology clearance which was provided by cardiology.  Patient was dialyzed before the surgery and then on 04/05/2019 he underwent anterior cervical corpectomy, microdissection, posterior cervical fusion.   Assessment & Plan:   Principal Problem:   Cervical radiculopathy Active Problems:   ESRD (end stage renal disease) on dialysis (HCC)   HTN (hypertension)   Neck pain   C6 and 7 pathological fracture with severe spinal stenosis with cord compression and cervical myelopathy due to renal osteodystrophy  CT cervical spine at OSH showed erosive changes compatible with renal spondyloarthropathy at C3-4, C6-7, C7-T1.  Continues to have cervical collar and dexamethasone 4 mg IV every 6 hourly.  Continue Pepcid. Postoperative day 5 status post anterior cervical corpectomy, microdissection, posterior cervical fusion.  Pain controlled.  Continue current pain medications.  Good strength in all extremities.  Further management per neurosurgery.   Appreciate their help.   ESRD on TTS HD  Had HD at OSH on 8/29.  Nephrology consulted for HD needs.  Getting hemodialysis on TTS schedule.  Secondary hyperparathyroidism  Management per nephrology.  Tobacco abuse  Cessation counseled.  Patient declines nicotine patch.  Chronic combined systolic and diastolic CHF  Last TTE 02/06/1949 showed LVEF 40% with hypokinesis of anteroseptal myocardium and grade 1 diastolic dysfunction.  Mild MR, moderate TR, moderate pulmonary hypertension.  Clinically euvolemic.  Volume management across HD.  Repeat TTE shows normal EF and no MR.  As stated above, requested preop cardiology clearance.  Chest pain/slightly elevated troponin  Apparently, he complained of some chest pain late afternoon of 04/06/2019 and telemetry nurses had noted ST elevation.  A stat EKG was done which did not show any ST-T wave changes.  Troponins were followed once again and they remained flat once again.  Patient might have had referred anterior neck pain to the chest.  He has not had any chest pain ever since.  Cirrhosis/splenomegaly  Noted on abdominal ultrasound 05/02/2018.  Had prior paracentesis 04/2018.  Quit alcohol intake 3 years ago.  Currently without clinical ascites.  INR 1.2 on 8/30.  Essential hypertension  Blood pressure has remained slightly elevated persistently now.  Will continue current dose of amlodipine.  Add lisinopril 20 mg p.o. daily.  Continue PRN hydralazine.  Thrombocytopenia  Appears chronic and has been dropping with 86 on 04/06/2019 and improving.  No signs of bleeding.  Watch closely while he is on subcutaneous heparin DVT prophylaxis dose.  Hypokalemia: Very mild 5.5.  Due to being HD patient, will defer to nephrology.  DVT prophylaxis: Subcutaneous heparin Code Status: Full Family Communication: None at bedside. Disposition: Pending CIR placement.  Consultants:  Neurosurgery Nephrology Cardiology  Procedures:  Cervical  collar anterior cervical corpectomy, microdissection, posterior cervical fusion on 04/05/2019.  Antimicrobials:  None   Subjective: Patient seen and examined.  Sitting in the bedside.  Using his phone.  No new complaint other than bilateral shoulder pain and anterior neck pain.  Objective:  Vitals:   04/10/19 0500 04/10/19 0510 04/10/19 0630 04/10/19 0800  BP: (!) 159/70 (!) 153/76 (!) 172/89 (!) 174/76  Pulse: 71 72 75 82  Resp: 13 15 13 11   Temp:  97.9 F (36.6 C) (!) 97.5 F (36.4 C) 98.3 F (36.8 C)  TempSrc:  Oral Oral Oral  SpO2:  100% 100% 100%  Weight:  67.6 kg    Height:        Examination:  General exam: Appears calm and comfortable, Aspen collar in place. Respiratory system: Clear to auscultation. Respiratory effort normal. Cardiovascular system: S1 & S2 heard, RRR. No JVD, murmurs, rubs, gallops or clicks. No pedal edema. Gastrointestinal system: Abdomen is nondistended, soft and nontender. No organomegaly or masses felt. Normal bowel sounds heard. Central nervous system: Alert and oriented. No focal neurological deficits. Extremities: Symmetric 5 x 5 power. Skin: No rashes, lesions or ulcers.  Psychiatry: Judgement and insight appear normal. Mood & affect appropriate.   Data Reviewed: I have personally reviewed following labs and imaging studies  CBC: Recent Labs  Lab 04/04/19 0733 04/05/19 0958 04/06/19 1106 04/07/19 0106 04/08/19 0531 04/09/19 0527  WBC 6.3  --  6.8 6.9 7.2 6.5  NEUTROABS  --   --  5.8 5.7 6.2 5.5  HGB 13.4 13.9 11.3* 10.9* 10.4* 10.8*  HCT 41.2 41.0 34.2* 33.1* 31.9* 33.5*  MCV 98.8  --  98.8 99.1 101.3* 100.9*  PLT 127*  --  86* 89* 94* 188*   Basic Metabolic Panel: Recent Labs  Lab 04/04/19 1313 04/05/19 0958 04/06/19 1106 04/07/19 0106 04/08/19 0531 04/09/19 0527  NA 133* 136 136 134* 133* 134*  K 5.3* 4.2 5.0 5.4* 4.5 5.5*  CL 95*  --  98 96* 95* 92*  CO2 21*  --  20* 21* 25 23  GLUCOSE 120* 108* 149* 140* 131*  134*  BUN 45*  --  60* 72* 44* 71*  CREATININE 7.82*  --  7.55* 8.50* 6.15* 7.92*  CALCIUM 8.5*  --  7.3* 7.0* 7.0* 7.2*  MG  --   --   --   --  2.4 2.6*  PHOS 6.5*  --   --   --   --   --    Liver Function Tests: Recent Labs  Lab 04/04/19 1313 04/07/19 0106 04/08/19 0531 04/09/19 0527  AST  --  24 20 25   ALT  --  12 7 8   ALKPHOS  --  157* 141* 156*  BILITOT  --  0.8 1.2 1.0  PROT  --  6.0* 5.7* 6.0*  ALBUMIN 3.7 3.1* 2.9* 3.0*    Cardiac Enzymes: No results for input(s): CKTOTAL, CKMB, CKMBINDEX, TROPONINI in the last 168 hours.  CBG: Recent Labs  Lab 04/03/19 1648 04/10/19 0212  GLUCAP 157* 134*    Recent Results (from the past 240 hour(s))  SARS Coronavirus 2 [facility] order, Performed in [facility] hospital lab) Nasopharyngeal Nasopharyngeal Swab     Status: None   Collection Time: 04/01/19 11:15 AM   Specimen: Nasopharyngeal Swab  Result Value Ref Range Status   SARS Coronavirus 2 NEGATIVE NEGATIVE Final    Comment: (NOTE) If result is NEGATIVE  SARS-CoV-2 target nucleic acids are NOT DETECTED. The SARS-CoV-2 RNA is generally detectable in upper and lower  respiratory specimens during the acute phase of infection. The lowest  concentration of SARS-CoV-2 viral copies this assay can detect is 250  copies / mL. A negative result does not preclude SARS-CoV-2 infection  and should not be used as the sole basis for treatment or other  patient management decisions.  A negative result may occur with  improper specimen collection / handling, submission of specimen other  than nasopharyngeal swab, presence of viral mutation(s) within the  areas targeted by this assay, and inadequate number of viral copies  (<250 copies / mL). A negative result must be combined with clinical  observations, patient history, and epidemiological information. If result is POSITIVE SARS-CoV-2 target nucleic acids are DETECTED. The SARS-CoV-2 RNA is generally detectable in upper and lower   respiratory specimens dur ing the acute phase of infection.  Positive  results are indicative of active infection with SARS-CoV-2.  Clinical  correlation with patient history and other diagnostic information is  necessary to determine patient infection status.  Positive results do  not rule out bacterial infection or co-infection with other viruses. If result is PRESUMPTIVE POSTIVE SARS-CoV-2 nucleic acids MAY BE PRESENT.   A presumptive positive result was obtained on the submitted specimen  and confirmed on repeat testing.  While 2019 novel coronavirus  (SARS-CoV-2) nucleic acids may be present in the submitted sample  additional confirmatory testing may be necessary for epidemiological  and / or clinical management purposes  to differentiate between  SARS-CoV-2 and other Sarbecovirus currently known to infect humans.  If clinically indicated additional testing with an alternate test  methodology [phone]) is advised. The SARS-CoV-2 RNA is generally  detectable in upper and lower respiratory sp ecimens during the acute  phase of infection. The expected result is Negative. Fact Sheet for Patients:  StrictlyIdeas.no Fact Sheet for Healthcare Providers: BankingDealers.co.za This test is not yet approved or cleared by the [place] FDA and has been authorized for detection and/or diagnosis of SARS-CoV-2 by FDA under an Emergency Use Authorization (EUA).  This EUA will remain in effect (meaning this test can be used) for the duration of the COVID-19 declaration under Section 564(b)(1) of the Act, 21 U.S.C. section 360bbb-3(b)(1), unless the authorization is terminated or revoked sooner. Performed at [facility], [address] [place], [place] [address]   MRSA PCR Screening     Status: None   Collection Time: 04/03/19  6:32 AM   Specimen: Nasal Mucosa; Nasopharyngeal  Result Value Ref Range Status   MRSA by PCR NEGATIVE  NEGATIVE Final    Comment:        The GeneXpert MRSA Assay (FDA approved for NASAL specimens only), is one component of a comprehensive MRSA colonization surveillance program. It is not intended to diagnose MRSA infection nor to guide or monitor treatment for MRSA infections. Performed at [facility], [place] [address] [place], [place] [address]          Radiology Studies: No results found.      Scheduled Meds: . amLODipine  10 mg Oral Daily  . Chlorhexidine Gluconate Cloth  6 each Topical Q0600  . cyclobenzaprine  10 mg Oral TID  . dexamethasone (DECADRON) injection  4 mg Intravenous Q6H  . famotidine  20 mg Oral Daily  . feeding supplement (PRO-STAT SUGAR FREE 64)  30 mL Oral BID  . heparin  5,000 Units Subcutaneous Q8H  .  lisinopril  20 mg Oral Daily  . sodium chloride flush  3 mL Intravenous Q12H   Continuous Infusions: . sodium chloride       LOS: 7 days   Darliss Cheney, MD,  [facility]  To contact the attending provider between 7A-7P or the covering provider during after hours 7P-7A, please log into the web site [email or web address] and access using universal  password for that web site. If you do not have the password, please call the hospital operator.  04/10/2019, 10:16 AM

## 2019-04-11 ENCOUNTER — Other Ambulatory Visit: Payer: Self-pay

## 2019-04-11 LAB — BASIC METABOLIC PANEL
Anion gap: 17 — ABNORMAL HIGH (ref 5–15)
BUN: 88 mg/dL — ABNORMAL HIGH (ref 6–20)
CO2: 24 mmol/L (ref 22–32)
Calcium: 7.3 mg/dL — ABNORMAL LOW (ref 8.9–10.3)
Chloride: 97 mmol/L — ABNORMAL LOW (ref 98–111)
Creatinine, Ser: 8.26 mg/dL — ABNORMAL HIGH (ref 0.61–1.24)
GFR calc Af Amer: 8 mL/min — ABNORMAL LOW (ref >60)
GFR calc non Af Amer: 7 mL/min — ABNORMAL LOW (ref >60)
Glucose, Bld: 134 mg/dL — ABNORMAL HIGH (ref 70–99)
Potassium: 5 mmol/L (ref 3.5–5.1)
Sodium: 138 mmol/L (ref 135–145)

## 2019-04-11 LAB — CBC WITH DIFFERENTIAL/PLATELET
Abs Immature Granulocytes: 0.69 10*3/uL — ABNORMAL HIGH (ref 0.00–0.07)
Basophils Absolute: 0 10*3/uL (ref 0.0–0.1)
Basophils Relative: 0 %
Eosinophils Absolute: 0 10*3/uL (ref 0.0–0.5)
Eosinophils Relative: 0 %
HCT: 35.2 % — ABNORMAL LOW (ref 39.0–52.0)
Hemoglobin: 11.5 g/dL — ABNORMAL LOW (ref 13.0–17.0)
Immature Granulocytes: 6 %
Lymphocytes Relative: 4 %
Lymphs Abs: 0.4 10*3/uL — ABNORMAL LOW (ref 0.7–4.0)
MCH: 33 pg (ref 26.0–34.0)
MCHC: 32.7 g/dL (ref 30.0–36.0)
MCV: 101.1 fL — ABNORMAL HIGH (ref 80.0–100.0)
Monocytes Absolute: 0.6 10*3/uL (ref 0.1–1.0)
Monocytes Relative: 5 %
Neutro Abs: 9.3 10*3/uL — ABNORMAL HIGH (ref 1.7–7.7)
Neutrophils Relative %: 85 %
Platelets: 161 10*3/uL (ref 150–400)
RBC: 3.48 MIL/uL — ABNORMAL LOW (ref 4.22–5.81)
RDW: 15.5 % (ref 11.5–15.5)
WBC: 11.1 10*3/uL — ABNORMAL HIGH (ref 4.0–10.5)
nRBC: 0.2 % (ref 0.0–0.2)

## 2019-04-11 LAB — MAGNESIUM: Magnesium: 2.7 mg/dL — ABNORMAL HIGH (ref 1.7–2.4)

## 2019-04-11 NOTE — Progress Notes (Signed)
   Providing Compassionate, Quality Care - Together   Subjective: Patient reports expected pain at neck and between shoulder blades.  Objective: Vital signs in last 24 hours: Temp:  [97.6 F (36.4 C)-98.2 F (36.8 C)] 97.9 F (36.6 C) (09/07 0836) Pulse Rate:  [73-86] 74 (09/07 0836) Resp:  [14-18] 16 (09/06 2340) BP: (149-183)/(62-84) 182/84 (09/07 0836) SpO2:  [97 %-100 %] 100 % (09/07 0836)  Intake/Output from previous day: 09/06 0701 - 09/07 0700 In: 322 [P.O.:322] Out: -  Intake/Output this shift: Total I/O In: 240 [P.O.:240] Out: 0   Alert and oriented x 4 PERRLA Speech fluent, voice clear Neck soft MAE, Strength 4/5 BUE, 5/5 BLE Incision closed with Steri Strips; dried sanguinous drainage, not actively draining  Lab Results: Recent Labs    04/09/19 0527 04/11/19 0841  WBC 6.5 11.1*  HGB 10.8* 11.5*  HCT 33.5* 35.2*  PLT 105* 161   BMET Recent Labs    04/09/19 0527 04/11/19 0841  NA 134* 138  K 5.5* 5.0  CL 92* 97*  CO2 23 24  GLUCOSE 134* 134*  BUN 71* 88*  CREATININE 7.92* 8.26*  CALCIUM 7.2* 7.3*    Studies/Results: No results found.  Assessment/Plan: Patient is 6 days s/p C6 and C7 corpectomy and microdissection, followed by C5-T1 anterior cervical fusion with interbody cage and locally harvested autograft.    LOS: 8 days    -Therapies recommending CIR -Continue to mobilize  Viona Gilmore, DNP, AGNP-C Nurse Practitioner  [facility] [place] [address], [address], [place],  [address] P: [phone]    F: [phone]  04/11/2019, 9:27 AM

## 2019-04-11 NOTE — Progress Notes (Signed)
Occupational Therapy Treatment Patient Details Name: Mario [name] MRN: [id number] DOB: [birth year] Today's Date: 04/11/2019    History of present illness 48 yo male s/p cervical fusion C5-C7  C6-7 fx with severe spinal stenosis with cord compression pMH: alcoholic cirrhosis, ERSD, HTN, CHF   OT comments  Pt noted to have large amount of drainage on dressing with saturated pads to ccollar. Rn callled to room and redressing wound at this time. Pt noted to have continued drainage from the bottom portion of wound. Please continue to monitor drainage. Pt reports pain 7 out 10 at all times and once sitting in bathroom for ccollar edcuation with collar off pain increasing. Pt reports pain is not controlled.    Follow Up Recommendations  Home health OT    Equipment Recommendations  3 in 1 bedside commode    Recommendations for Other Services      Precautions / Restrictions Precautions Precautions: Cervical Precaution Comments: interpretor used paty [id number] Required Braces or Orthoses: Cervical Brace Cervical Brace: Hard collar;At all times       Mobility Bed Mobility Overal bed mobility: Modified Independent                Transfers Overall transfer level: Needs assistance   Transfers: Sit to/from Stand Sit to Stand: Supervision              Balance                                           ADL either performed or assessed with clinical judgement   ADL Overall ADL's : Needs assistance/impaired Eating/Feeding: Modified independent Eating/Feeding Details (indicate cue type and reason): eating hamburger reports food is good now             Upper Body Dressing : Minimal assistance;Sitting Upper Body Dressing Details (indicate cue type and reason): don doff cervical brace. pt with drainage noted and RN called to room     Toilet Transfer: Min guard           Functional mobility during ADLs: Min guard;Rolling walker General ADL  Comments: pt will need ccollar and education reinforced     Vision       Perception     Praxis      Cognition Arousal/Alertness: Awake/alert Behavior During Therapy: Flat affect Overall Cognitive Status: Impaired/Different from baseline Area of Impairment: Memory;Awareness                     Memory: Decreased recall of precautions         General Comments: poor recall of precautions and now reports that someon will be home with him at all time.         Exercises     Shoulder Instructions       General Comments RN changing dressing on neck and OT changed to ccoollar pads iwth patient demonstrating how to complete task. OT washing out pads and laying out to dry    Pertinent Vitals/ Pain       Pain Assessment: 0-10 Pain Score: 7  Pain Location: neck Pain Descriptors / Indicators: Discomfort Pain Intervention(s): Monitored during session;Premedicated before session;Repositioned  Home Living  Prior Functioning/Environment              Frequency  Min 3X/week        Progress Toward Goals  OT Goals(current goals can now be found in the care plan section)  Progress towards OT goals: Progressing toward goals  Acute Rehab OT Goals Patient Stated Goal: pain is too much OT Goal Formulation: With patient Time For Goal Achievement: 04/22/19 Potential to Achieve Goals: Good ADL Goals Pt Will Perform Lower Body Dressing: with modified independence Pt Will Transfer to Toilet: with modified independence Additional ADL Goal #1: Pt will don doff ccollar mod I as precursor to adls. Additional ADL Goal #2: pt will complete bed mobility mod I as precursor to adls  Plan Discharge plan remains appropriate;Frequency remains appropriate    Co-evaluation                 AM-PAC OT "6 Clicks" Daily Activity     Outcome Measure   Help from another person eating meals?: A Little Help from  another person taking care of personal grooming?: A Little Help from another person toileting, which includes using toliet, bedpan, or urinal?: A Lot Help from another person bathing (including washing, rinsing, drying)?: A Little Help from another person to put on and taking off regular upper body clothing?: A Little Help from another person to put on and taking off regular lower body clothing?: A Little 6 Click Score: 17    End of Session Equipment Utilized During Treatment: Rolling walker;Cervical collar  OT Visit Diagnosis: Unsteadiness on feet (R26.81);Muscle weakness (generalized) (M62.81)   Activity Tolerance Patient tolerated treatment well   Patient Left Other (comment)   Nurse Communication Mobility status;Precautions        Time: 1310(1310)-1409 OT Time Calculation (min): 59 min  Charges: OT General Charges $OT Visit: 1 Visit OT Treatments $Self Care/Home Management : 53-67 mins   Jeri Modena, OTR/L  Acute Rehabilitation Services Pager: [phone] Office: [phone] .    Jeri Modena 04/11/2019, 3:31 PM

## 2019-04-11 NOTE — Care Management (Addendum)
  1700 Patient has been accepted by Mateo Flow with [facility]. Will need orders    Patient prefers home with home health.  Charity this week is Microbiologist. Gave referral to [facility] with [facility]. Valerie checking to see if [facility] can accept.    Magdalen Spatz RN [phone]

## 2019-04-11 NOTE — Progress Notes (Signed)
  [facility] Progress Note   Subjective: No complaints, potentially to CIR  Objective Vitals:   04/10/19 2010 04/10/19 2340 04/11/19 0803 04/11/19 0836  BP: (!) 169/62 (!) 149/67  (!) 182/84  Pulse: 74 73  74  Resp: 18 16    Temp: 98.2 F (36.8 C) 98 F (36.7 C) 97.7 F (36.5 C) 97.9 F (36.6 C)  TempSrc: Oral Oral Oral Oral  SpO2: 97%   100%  Weight:      Height:       Physical Exam General: Well appearing man. Large neck brace. Room air. Heart: RRR; 2/6 SEM Lungs: CTAB Abdomen: soft, non-tender Extremities: No LE edema Dialysis Access:  L AVF, aneurysmal, + thrill  Additional Objective Labs: Basic Metabolic Panel: Recent Labs  Lab 04/04/19 1313  04/08/19 0531 04/09/19 0527 04/11/19 0841  NA 133*   < > 133* 134* 138  K 5.3*   < > 4.5 5.5* 5.0  CL 95*   < > 95* 92* 97*  CO2 21*   < > 25 23 24   GLUCOSE 120*   < > 131* 134* 134*  BUN 45*   < > 44* 71* 88*  CREATININE 7.82*   < > 6.15* 7.92* 8.26*  CALCIUM 8.5*   < > 7.0* 7.2* 7.3*  PHOS 6.5*  --   --   --   --    < > = values in this interval not displayed.   Liver Function Tests: Recent Labs  Lab 04/07/19 0106 04/08/19 0531 04/09/19 0527  AST 24 20 25   ALT 12 7 8   ALKPHOS 157* 141* 156*  BILITOT 0.8 1.2 1.0  PROT 6.0* 5.7* 6.0*  ALBUMIN 3.1* 2.9* 3.0*   CBC: Recent Labs  Lab 04/06/19 1106 04/07/19 0106 04/08/19 0531 04/09/19 0527 04/11/19 0841  WBC 6.8 6.9 7.2 6.5 11.1*  NEUTROABS 5.8 5.7 6.2 5.5 9.3*  HGB 11.3* 10.9* 10.4* 10.8* 11.5*  HCT 34.2* 33.1* 31.9* 33.5* 35.2*  MCV 98.8 99.1 101.3* 100.9* 101.1*  PLT 86* 89* 94* 105* 161   CBG: Recent Labs  Lab 04/10/19 0212  GLUCAP 134*   Medications: . sodium chloride     . amLODipine  10 mg Oral Daily  . Chlorhexidine Gluconate Cloth  6 each Topical Q0600  . cyclobenzaprine  10 mg Oral TID  . dexamethasone (DECADRON) injection  4 mg Intravenous Q6H  . famotidine  20 mg Oral Daily  . feeding supplement (PRO-STAT  SUGAR FREE 64)  30 mL Oral BID  . heparin  5,000 Units Subcutaneous Q8H  . lisinopril  20 mg Oral Daily  . sodium chloride flush  3 mL Intravenous Q12H    Dialysis Orders: TTS at [facility]) 3hr, 450/800, 2K/2.5Ca, EDW 65kg, L AVF, no heparin - Hectoral 23mcg IV q HD  Assessment/Plan: 1. C6-C7 Fractures with myelopathy + spinal cord compression: S/p C6-C7 corpectomy/ microdissection then C5-T1 fusion by Dr. Annette Stable on 04/05/19  2. ESRD: Continue HD per TTS schedule - next 9/8. No heparin. 3. HTN/volume: BP high - no edema, may be pain or steroid driven. Continue home meds - may need to add another agent. 4. Anemia: Hgb stable above 10, - follow without ESA. 5. Secondary hyperparathyroidism: CorrCa ok, Phos pending. ?Not on binders. 6. Nutrition: Alb low, continue supplements 7. Hx CHF 8. EtOH cirrhosis. 9. Debility - PT recommending CIR

## 2019-04-11 NOTE — Progress Notes (Signed)
Inpatient Rehabilitation-Admissions Coordinator   Met with pt at the bedside for rehabilitation assessment and to discuss recommended inpatient rehab program. Pt understands he would benefit from rehab but does not want to risk the possible financial burden. He adamantly states that he wants to return home and feels he can manage with support form his cousins. He asked about HH option, with AC discussing he most likely will not get services as he is uninsured. Pt still wants to DC home once medically ready.  AC will sign off and will leave message for CM/SW regarding request for Home Health if able.   Please call if questions.   Jhonnie Garner, OTR/L  Rehab Admissions Coordinator  [phone] 04/11/2019 12:57 PM

## 2019-04-11 NOTE — Progress Notes (Signed)
PROGRESS NOTE   Mario [name]  [name]    DOB: [birth year]    DOA: 04/03/2019  PCP: Mario Legato, MD   I have briefly reviewed patients previous medical records in [facility].  Chief complaint Neck pain with radiation to arms and associated weakness.  Brief Narrative:  48 year old Spanish-speaking male, lives alone, independent, PMH of ESRD on TTS HD via LUE AVF, chronic combined systolic and diastolic CHF, LVEF 56% by TTE in 2017, mild mitral regurgitation, cirrhosis, HTN, initially admitted to [facility] on 04/01/2019 due to progressively worsening neck/upper back pain with radiation to bilateral upper extremity with associated tingling, numbness and upper extremity weakness, left> right.  CT cervical spine showed erosive changes compatible with renal spondyloarthropathy at C3-4, C6-7, C7-T1.  Patient was transferred to [facility] for Neurosurgical consultation.  Better after steroids and cervical collar.  Neurosurgery requested preop cardiology clearance which was provided by cardiology.  Patient was dialyzed before the surgery and then on 04/05/2019 he underwent anterior cervical corpectomy, microdissection, posterior cervical fusion.   Assessment & Plan:   Principal Problem:   Cervical radiculopathy Active Problems:   ESRD (end stage renal disease) on dialysis (HCC)   HTN (hypertension)   Neck pain   C6 and 7 pathological fracture with severe spinal stenosis with cord compression and cervical myelopathy due to renal osteodystrophy  CT cervical spine at OSH showed erosive changes compatible with renal spondyloarthropathy at C3-4, C6-7, C7-T1.  Continues to have cervical collar and dexamethasone 4 mg IV every 6 hourly.  Continue Pepcid. Postoperative day 6 status post anterior cervical corpectomy, microdissection, posterior cervical fusion.  Pain controlled.  Continue current pain medications.  Good strength in all extremities.  Further management per neurosurgery.   Appreciate their help.   ESRD on TTS HD  Had HD at OSH on 8/29.  Nephrology consulted for HD needs.  Getting hemodialysis on TTS schedule.  Secondary hyperparathyroidism  Management per nephrology.  Tobacco abuse  Cessation counseled.  Patient declines nicotine patch.  Chronic combined systolic and diastolic CHF  Last TTE 10/10/7562 showed LVEF 40% with hypokinesis of anteroseptal myocardium and grade 1 diastolic dysfunction.  Mild MR, moderate TR, moderate pulmonary hypertension.  Clinically euvolemic.  Volume management across HD.  Repeat TTE shows normal EF and no MR.  As stated above, requested preop cardiology clearance.  Chest pain/slightly elevated troponin  Apparently, he complained of some chest pain late afternoon of 04/06/2019 and telemetry nurses had noted ST elevation.  A stat EKG was done which did not show any ST-T wave changes.  Troponins were followed once again and they remained flat once again.  Patient might have had referred anterior neck pain to the chest.  He has not had any chest pain ever since.  Cirrhosis/splenomegaly  Noted on abdominal ultrasound 05/02/2018.  Had prior paracentesis 04/2018.  Quit alcohol intake 3 years ago.  Currently without clinical ascites.  INR 1.2 on 8/30.  Essential hypertension  Blood pressure still slightly elevated.  Will continue current dose of amlodipine.  Increase lisinopril to 40 mg p.o. daily.  Continue PRN hydralazine.  Thrombocytopenia  Appears chronic and has been dropping with 86 on 04/06/2019 and improving.  No signs of bleeding.  Watch closely while he is on subcutaneous heparin DVT prophylaxis dose.  Hypokalemia: Resolved.  DVT prophylaxis: Subcutaneous heparin Code Status: Full Family Communication: None at bedside. Disposition: Pending CIR placement.   Consultants:  Neurosurgery Nephrology Cardiology  Procedures:  Cervical collar anterior cervical corpectomy, microdissection,  posterior cervical  fusion on 04/05/2019.  Antimicrobials:  None   Subjective: Seen and examined.  Feels better today.  Still complains of shoulder pain and neck pain but he states that it is better improving.  Objective:  Vitals:   04/10/19 2010 04/10/19 2340 04/11/19 0803 04/11/19 0836  BP: (!) 169/62 (!) 149/67  (!) 182/84  Pulse: 74 73  74  Resp: 18 16    Temp: 98.2 F (36.8 C) 98 F (36.7 C) 97.7 F (36.5 C) 97.9 F (36.6 C)  TempSrc: Oral Oral Oral Oral  SpO2: 97%   100%  Weight:      Height:        Examination:  General exam: Appears calm and comfortable with Aspen collar in neck. Respiratory system: Clear to auscultation. Respiratory effort normal. Cardiovascular system: S1 & S2 heard, RRR. No JVD, murmurs, rubs, gallops or clicks. No pedal edema. Gastrointestinal system: Abdomen is nondistended, soft and nontender. No organomegaly or masses felt. Normal bowel sounds heard. Central nervous system: Alert and oriented. No focal neurological deficits. Extremities: Symmetric 5 x 5 power. Skin: No rashes, lesions or ulcers.  Psychiatry: Judgement and insight appear normal. Mood & affect appropriate.   Data Reviewed: I have personally reviewed following labs and imaging studies  CBC: Recent Labs  Lab 04/06/19 1106 04/07/19 0106 04/08/19 0531 04/09/19 0527 04/11/19 0841  WBC 6.8 6.9 7.2 6.5 11.1*  NEUTROABS 5.8 5.7 6.2 5.5 9.3*  HGB 11.3* 10.9* 10.4* 10.8* 11.5*  HCT 34.2* 33.1* 31.9* 33.5* 35.2*  MCV 98.8 99.1 101.3* 100.9* 101.1*  PLT 86* 89* 94* 105* 193   Basic Metabolic Panel: Recent Labs  Lab 04/04/19 1313  04/06/19 1106 04/07/19 0106 04/08/19 0531 04/09/19 0527 04/11/19 0841  NA 133*   < > 136 134* 133* 134* 138  K 5.3*   < > 5.0 5.4* 4.5 5.5* 5.0  CL 95*  --  98 96* 95* 92* 97*  CO2 21*  --  20* 21* 25 23 24   GLUCOSE 120*   < > 149* 140* 131* 134* 134*  BUN 45*  --  60* 72* 44* 71* 88*  CREATININE 7.82*  --  7.55* 8.50* 6.15* 7.92* 8.26*  CALCIUM 8.5*  --   7.3* 7.0* 7.0* 7.2* 7.3*  MG  --   --   --   --  2.4 2.6* 2.7*  PHOS 6.5*  --   --   --   --   --   --    < > = values in this interval not displayed.   Liver Function Tests: Recent Labs  Lab 04/04/19 1313 04/07/19 0106 04/08/19 0531 04/09/19 0527  AST  --  24 20 25   ALT  --  12 7 8   ALKPHOS  --  157* 141* 156*  BILITOT  --  0.8 1.2 1.0  PROT  --  6.0* 5.7* 6.0*  ALBUMIN 3.7 3.1* 2.9* 3.0*    Cardiac Enzymes: No results for input(s): CKTOTAL, CKMB, CKMBINDEX, TROPONINI in the last 168 hours.  CBG: Recent Labs  Lab 04/10/19 0212  GLUCAP 134*    Recent Results (from the past 240 hour(s))  SARS Coronavirus 2 [facility] order, Performed in [facility] hospital lab) Nasopharyngeal Nasopharyngeal Swab     Status: None   Collection Time: 04/01/19 11:15 AM   Specimen: Nasopharyngeal Swab  Result Value Ref Range Status   SARS Coronavirus 2 NEGATIVE NEGATIVE Final    Comment: (NOTE) If result is NEGATIVE SARS-CoV-2 target nucleic  acids are NOT DETECTED. The SARS-CoV-2 RNA is generally detectable in upper and lower  respiratory specimens during the acute phase of infection. The lowest  concentration of SARS-CoV-2 viral copies this assay can detect is 250  copies / mL. A negative result does not preclude SARS-CoV-2 infection  and should not be used as the sole basis for treatment or other  patient management decisions.  A negative result may occur with  improper specimen collection / handling, submission of specimen other  than nasopharyngeal swab, presence of viral mutation(s) within the  areas targeted by this assay, and inadequate number of viral copies  (<250 copies / mL). A negative result must be combined with clinical  observations, patient history, and epidemiological information. If result is POSITIVE SARS-CoV-2 target nucleic acids are DETECTED. The SARS-CoV-2 RNA is generally detectable in upper and lower  respiratory specimens dur ing the acute phase of infection.   Positive  results are indicative of active infection with SARS-CoV-2.  Clinical  correlation with patient history and other diagnostic information is  necessary to determine patient infection status.  Positive results do  not rule out bacterial infection or co-infection with other viruses. If result is PRESUMPTIVE POSTIVE SARS-CoV-2 nucleic acids MAY BE PRESENT.   A presumptive positive result was obtained on the submitted specimen  and confirmed on repeat testing.  While 2019 novel coronavirus  (SARS-CoV-2) nucleic acids may be present in the submitted sample  additional confirmatory testing may be necessary for epidemiological  and / or clinical management purposes  to differentiate between  SARS-CoV-2 and other Sarbecovirus currently known to infect humans.  If clinically indicated additional testing with an alternate test  methodology [phone]) is advised. The SARS-CoV-2 RNA is generally  detectable in upper and lower respiratory sp ecimens during the acute  phase of infection. The expected result is Negative. Fact Sheet for Patients:  StrictlyIdeas.no Fact Sheet for Healthcare Providers: BankingDealers.co.za This test is not yet approved or cleared by the [place] FDA and has been authorized for detection and/or diagnosis of SARS-CoV-2 by FDA under an Emergency Use Authorization (EUA).  This EUA will remain in effect (meaning this test can be used) for the duration of the COVID-19 declaration under Section 564(b)(1) of the Act, 21 U.S.C. section 360bbb-3(b)(1), unless the authorization is terminated or revoked sooner. Performed at [facility], [address] [place], [place] [address]   MRSA PCR Screening     Status: None   Collection Time: 04/03/19  6:32 AM   Specimen: Nasal Mucosa; Nasopharyngeal  Result Value Ref Range Status   MRSA by PCR NEGATIVE NEGATIVE Final    Comment:        The GeneXpert MRSA Assay  (FDA approved for NASAL specimens only), is one component of a comprehensive MRSA colonization surveillance program. It is not intended to diagnose MRSA infection nor to guide or monitor treatment for MRSA infections. Performed at [facility], [place] [address] [place], [place] [address]          Radiology Studies: No results found.      Scheduled Meds: . amLODipine  10 mg Oral Daily  . Chlorhexidine Gluconate Cloth  6 each Topical Q0600  . cyclobenzaprine  10 mg Oral TID  . dexamethasone (DECADRON) injection  4 mg Intravenous Q6H  . famotidine  20 mg Oral Daily  . feeding supplement (PRO-STAT SUGAR FREE 64)  30 mL Oral BID  . heparin  5,000 Units Subcutaneous Q8H  . lisinopril  20 mg Oral Daily  . sodium chloride flush  3 mL Intravenous Q12H   Continuous Infusions: . sodium chloride       LOS: 8 days   Darliss Cheney, MD,  [facility]  To contact the attending provider between 7A-7P or the covering provider during after hours 7P-7A, please log into the web site [email or web address] and access using universal [place] password for that web site. If you do not have the password, please call the hospital operator.  04/11/2019, 10:13 AM

## 2019-04-11 NOTE — Progress Notes (Signed)
Physical Therapy Treatment Patient Details Name: Mario [name] MRN: [id number] DOB: [birth year] Today's Date: 04/11/2019    History of Present Illness 48 yo male s/p cervical fusion C5-C7  C6-7 fx with severe spinal stenosis with cord compression pMH: alcoholic cirrhosis, ERSD, HTN, CHF    PT Comments    Pt progressing towards goals. Pt remains to have flat affect with c/o bilat shoulder pain at 7/10. Pt with improved ambulation tolerance today but cont to required RW for safe ambulation. Atttempted HHA however pt unsteady and requiring increased assist due to instability. [name] #[id number] for session. Acute PT to cont to follow. Cont to recommend CIR.   Follow Up Recommendations  CIR     Equipment Recommendations  Rolling walker with 5" wheels;3in1 (PT)    Recommendations for Other Services Rehab consult     Precautions / Restrictions Precautions Precautions: Cervical Precaution Booklet Issued: No Precaution Comments: pt educated on cervical precautions via interpretor [name] [phone] Required Braces or Orthoses: Cervical Brace Cervical Brace: Hard collar;At all times Restrictions Weight Bearing Restrictions: No    Mobility  Bed Mobility Overal bed mobility: Needs Assistance Bed Mobility: Rolling;Sidelying to Sit Rolling: Supervision Sidelying to sit: Supervision       General bed mobility comments: verbal cues to log roll instead up pulling self up into log sit and straining neck  Transfers Overall transfer level: Needs assistance Equipment used: None Transfers: Sit to/from Stand Sit to Stand: Min assist         General transfer comment: increased time, used hands to push up on thighs, minA to steady upon standing  Ambulation/Gait Ambulation/Gait assistance: Min assist Gait Distance (Feet): 120 Feet Assistive device: Rolling walker (2 wheeled);1 person hand held assist Gait Pattern/deviations: Step-through pattern;Decreased stride length Gait  velocity: decreased Gait velocity interpretation: <1.8 ft/sec, indicate of risk for recurrent falls General Gait Details: initially attempted to amb with R HHA however pt very guarded and unsteady, rigid in attempting to take steps, pt asked for RW, pt with improved stability. verbal cues to decrease bilat UE support and for erect posture, pt with increased trunk flexion with onset of fatigue   Stairs             Wheelchair Mobility    Modified Rankin (Stroke Patients Only)       Balance Overall balance assessment: Needs assistance Sitting-balance support: Feet supported Sitting balance-Leahy Scale: Good     Standing balance support: Bilateral upper extremity supported;During functional activity;No upper extremity supported Standing balance-Leahy Scale: Poor Standing balance comment: Requires UE support for stability                            Cognition Arousal/Alertness: Awake/alert Behavior During Therapy: Flat affect Overall Cognitive Status: Impaired/Different from baseline Area of Impairment: Memory;Awareness                     Memory: Decreased recall of precautions       Problem Solving: Requires verbal cues;Requires tactile cues General Comments: pt no impulsive today, no recall of cervical precautions, pt more aware of safety, asked to use RW for stability      Exercises      General Comments General comments (skin integrity, edema, etc.): provided therapeutic massage to bilat upper traps to aid in pain management and to release tension in shoulders      Pertinent Vitals/Pain Pain Assessment: 0-10 Pain Score: 7  Pain Location: bilat  shoulders Pain Descriptors / Indicators: Discomfort Pain Intervention(s): Monitored during session    Home Living Family/patient expects to be discharged to:: Private residence Living Arrangements: Alone                  Prior Function            PT Goals (current goals can now be  found in the care plan section) Progress towards PT goals: Progressing toward goals    Frequency    Min 5X/week      PT Plan Current plan remains appropriate    Co-evaluation              AM-PAC PT "6 Clicks" Mobility   Outcome Measure  Help needed turning from your back to your side while in a flat bed without using bedrails?: A Little Help needed moving from lying on your back to sitting on the side of a flat bed without using bedrails?: A Little Help needed moving to and from a bed to a chair (including a wheelchair)?: A Little Help needed standing up from a chair using your arms (e.g., wheelchair or bedside chair)?: A Little Help needed to walk in hospital room?: A Little Help needed climbing 3-5 steps with a railing? : A Lot 6 Click Score: 17    End of Session Equipment Utilized During Treatment: Cervical collar Activity Tolerance: Patient tolerated treatment well Patient left: in bed;with call bell/phone within reach;Other (comment) Nurse Communication: Mobility status PT Visit Diagnosis: Unsteadiness on feet (R26.81);Other abnormalities of gait and mobility (R26.89);Pain;Difficulty in walking, not elsewhere classified (R26.2)     Time: 9509-3267 PT Time Calculation (min) (ACUTE ONLY): 24 min  Charges:  $Gait Training: 8-22 mins $Therapeutic Exercise: 8-22 mins                     Kittie Plater, PT, DPT Acute Rehabilitation Services Pager #: [phone] Office #: [phone]    Berline Lopes 04/11/2019, 8:43 AM

## 2019-04-12 LAB — RENAL FUNCTION PANEL
Albumin: 3 g/dL — ABNORMAL LOW (ref 3.5–5.0)
Anion gap: 19 — ABNORMAL HIGH (ref 5–15)
BUN: 120 mg/dL — ABNORMAL HIGH (ref 6–20)
CO2: 18 mmol/L — ABNORMAL LOW (ref 22–32)
Calcium: 6.2 mg/dL — CL (ref 8.9–10.3)
Chloride: 95 mmol/L — ABNORMAL LOW (ref 98–111)
Creatinine, Ser: 10.01 mg/dL — ABNORMAL HIGH (ref 0.61–1.24)
GFR calc Af Amer: 6 mL/min — ABNORMAL LOW (ref >60)
GFR calc non Af Amer: 5 mL/min — ABNORMAL LOW (ref >60)
Glucose, Bld: 136 mg/dL — ABNORMAL HIGH (ref 70–99)
Phosphorus: 6.7 mg/dL — ABNORMAL HIGH (ref 2.5–4.6)
Potassium: 5.8 mmol/L — ABNORMAL HIGH (ref 3.5–5.1)
Sodium: 132 mmol/L — ABNORMAL LOW (ref 135–145)

## 2019-04-12 LAB — CBC
HCT: 45.3 % (ref 39.0–52.0)
Hemoglobin: 14.7 g/dL (ref 13.0–17.0)
MCH: 32.7 pg (ref 26.0–34.0)
MCHC: 32.5 g/dL (ref 30.0–36.0)
MCV: 100.9 fL — ABNORMAL HIGH (ref 80.0–100.0)
Platelets: 113 10*3/uL — ABNORMAL LOW (ref 150–400)
RBC: 4.49 MIL/uL (ref 4.22–5.81)
RDW: 16.4 % — ABNORMAL HIGH (ref 11.5–15.5)
WBC: 9.1 10*3/uL (ref 4.0–10.5)
nRBC: 0 % (ref 0.0–0.2)

## 2019-04-12 LAB — PHOSPHORUS: Phosphorus: 6.9 mg/dL — ABNORMAL HIGH (ref 2.5–4.6)

## 2019-04-12 MED ORDER — CALCIUM CARBONATE ANTACID 500 MG PO CHEW
400.0000 mg | CHEWABLE_TABLET | Freq: Every day | ORAL | Status: DC
  Administered 2019-04-12: 400 mg via ORAL
  Filled 2019-04-12: qty 2

## 2019-04-12 NOTE — Progress Notes (Signed)
PROGRESS NOTE   Mario [name]  VHQ:[id number]    DOB: 07/09/1971    DOA: 04/03/2019  PCP: [name], MD   I have briefly reviewed patients previous medical records in [facility].  Chief complaint Neck pain with radiation to arms and associated weakness.  Brief Narrative:  48 year old Spanish-speaking male, lives alone, independent, PMH of ESRD on TTS HD via LUE AVF, chronic combined systolic and diastolic CHF, LVEF 41% by TTE in 2017, mild mitral regurgitation, cirrhosis, HTN, initially admitted to [facility] on 04/01/2019 due to progressively worsening neck/upper back pain with radiation to bilateral upper extremity with associated tingling, numbness and upper extremity weakness, left> right.  CT cervical spine showed erosive changes compatible with renal spondyloarthropathy at C3-4, C6-7, C7-T1.  Patient was transferred to [facility] for Neurosurgical consultation.  Better after steroids and cervical collar.  Neurosurgery requested preop cardiology clearance which was provided by cardiology.  Patient was dialyzed before the surgery and then on 04/05/2019 he underwent anterior cervical corpectomy, microdissection, posterior cervical fusion.  Patient continued to receive his scheduled HD with nephrology.  He was evaluated by PT OT and they recommended CIR.   Assessment & Plan:   Principal Problem:   Cervical radiculopathy Active Problems:   ESRD (end stage renal disease) on dialysis (HCC)   HTN (hypertension)   Neck pain   C6 and 7 pathological fracture with severe spinal stenosis with cord compression and cervical myelopathy due to renal osteodystrophy  CT cervical spine at OSH showed erosive changes compatible with renal spondyloarthropathy at C3-4, C6-7, C7-T1.  Continues to have cervical collar and dexamethasone 4 mg IV every 6 hourly.  Continue Pepcid. Postoperative day 7 status post anterior cervical corpectomy, microdissection, posterior cervical fusion.  Pain controlled.   Continue current pain medications.  Good strength in all extremities.  Further management per neurosurgery.  Appreciate their help.   ESRD on TTS HD  Had HD at OSH on 8/29.  Nephrology consulted for HD needs.  Getting hemodialysis on TTS schedule.  Secondary hyperparathyroidism  Management per nephrology.  Tobacco abuse  Cessation counseled.  Patient declines nicotine patch.  Chronic combined systolic and diastolic CHF  Last TTE 10/04/4399 showed LVEF 40% with hypokinesis of anteroseptal myocardium and grade 1 diastolic dysfunction.  Mild MR, moderate TR, moderate pulmonary hypertension.  Clinically euvolemic.  Volume management across HD.  Repeat TTE shows normal EF and no MR.  As stated above, requested preop cardiology clearance.  Chest pain/slightly elevated troponin  Apparently, he complained of some chest pain late afternoon of 04/06/2019 and telemetry nurses had noted ST elevation.  A stat EKG was done which did not show any ST-T wave changes.  Troponins were followed once again and they remained flat once again.  Patient might have had referred anterior neck pain to the chest.  He has not had any chest pain ever since.  Cirrhosis/splenomegaly  Noted on abdominal ultrasound 05/02/2018.  Had prior paracentesis 04/2018.  Quit alcohol intake 3 years ago.  Currently without clinical ascites.  INR 1.2 on 8/30.  Essential hypertension  Blood pressure much better controlled now.  Will continue current dose of amlodipine.  Continue lisinopril 40 mg p.o. daily.  Continue PRN hydralazine.  Thrombocytopenia  Appears chronic and has been dropping with 86 on 04/06/2019 and improving.  No signs of bleeding.  Watch closely while he is on subcutaneous heparin DVT prophylaxis dose.  Hypokalemia: Resolved.  DVT prophylaxis: Subcutaneous heparin Code Status: Full Family Communication: None at  bedside. Disposition: Potential discharge tomorrow home with home health.   Consultants:   Neurosurgery Nephrology Cardiology  Procedures:  Cervical collar anterior cervical corpectomy, microdissection, posterior cervical fusion on 04/05/2019.  Antimicrobials:  None   Subjective: Patient seen and examined.  Continues to complain of shoulder pain and neck pain.  No new complaint.  Denied any chest pain or shortness of breath.  Objective:  Vitals:   04/11/19 2330 04/12/19 0400 04/12/19 0721 04/12/19 1252  BP: 140/65 120/62 (!) 155/75 (!) 149/62  Pulse: 66  74 76  Resp:    20  Temp: (!) 97.5 F (36.4 C) (!) 97.5 F (36.4 C) (!) 97.5 F (36.4 C) 98.2 F (36.8 C)  TempSrc: Oral Oral Oral Oral  SpO2: 96% 100% 100% 100%  Weight:      Height:        Examination:  General exam: Appears calm and comfortable, Aspen collar in neck. Respiratory system: Clear to auscultation. Respiratory effort normal. Cardiovascular system: S1 & S2 heard, RRR. No JVD, murmurs, rubs, gallops or clicks. No pedal edema. Gastrointestinal system: Abdomen is nondistended, soft and nontender. No organomegaly or masses felt. Normal bowel sounds heard. Central nervous system: Alert and oriented. No focal neurological deficits. Extremities: Symmetric 5 x 5 power. Skin: No rashes, lesions or ulcers.  Psychiatry: Judgement and insight appear normal. Mood & affect appropriate.   Data Reviewed: I have personally reviewed following labs and imaging studies  CBC: Recent Labs  Lab 04/06/19 1106 04/07/19 0106 04/08/19 0531 04/09/19 0527 04/11/19 0841  WBC 6.8 6.9 7.2 6.5 11.1*  NEUTROABS 5.8 5.7 6.2 5.5 9.3*  HGB 11.3* 10.9* 10.4* 10.8* 11.5*  HCT 34.2* 33.1* 31.9* 33.5* 35.2*  MCV 98.8 99.1 101.3* 100.9* 101.1*  PLT 86* 89* 94* 105* 734   Basic Metabolic Panel: Recent Labs  Lab 04/06/19 1106 04/07/19 0106 04/08/19 0531 04/09/19 0527 04/11/19 0841  NA 136 134* 133* 134* 138  K 5.0 5.4* 4.5 5.5* 5.0  CL 98 96* 95* 92* 97*  CO2 20* 21* 25 23 24   GLUCOSE 149* 140* 131* 134* 134*  BUN  60* 72* 44* 71* 88*  CREATININE 7.55* 8.50* 6.15* 7.92* 8.26*  CALCIUM 7.3* 7.0* 7.0* 7.2* 7.3*  MG  --   --  2.4 2.6* 2.7*   Liver Function Tests: Recent Labs  Lab 04/07/19 0106 04/08/19 0531 04/09/19 0527  AST 24 20 25   ALT 12 7 8   ALKPHOS 157* 141* 156*  BILITOT 0.8 1.2 1.0  PROT 6.0* 5.7* 6.0*  ALBUMIN 3.1* 2.9* 3.0*    Cardiac Enzymes: No results for input(s): CKTOTAL, CKMB, CKMBINDEX, TROPONINI in the last 168 hours.  CBG: Recent Labs  Lab 04/10/19 0212  GLUCAP 134*    Recent Results (from the past 240 hour(s))  MRSA PCR Screening     Status: None   Collection Time: 04/03/19  6:32 AM   Specimen: Nasal Mucosa; Nasopharyngeal  Result Value Ref Range Status   MRSA by PCR NEGATIVE NEGATIVE Final    Comment:        The GeneXpert MRSA Assay (FDA approved for NASAL specimens only), is one component of a comprehensive MRSA colonization surveillance program. It is not intended to diagnose MRSA infection nor to guide or monitor treatment for MRSA infections. Performed at [facility], [place] [address], [place], [place] [address]          Radiology Studies: No results found.  Scheduled Meds: . amLODipine  10 mg Oral Daily  .  Chlorhexidine Gluconate Cloth  6 each Topical Q0600  . cyclobenzaprine  10 mg Oral TID  . dexamethasone (DECADRON) injection  4 mg Intravenous Q6H  . famotidine  20 mg Oral Daily  . feeding supplement (PRO-STAT SUGAR FREE 64)  30 mL Oral BID  . heparin  5,000 Units Subcutaneous Q8H  . lisinopril  20 mg Oral Daily  . sodium chloride flush  3 mL Intravenous Q12H   Continuous Infusions: . sodium chloride       LOS: 9 days   Darliss Cheney, MD,  [facility]  To contact the attending provider between 7A-7P or the covering provider during after hours 7P-7A, please log into the web site [email or web address] and access using universal [place] password for that web site. If you do not have the password, please call the hospital  operator.  04/12/2019, 1:12 PM

## 2019-04-12 NOTE — Progress Notes (Signed)
CRITICAL VALUE ALERT  Critical Value:  Calcium: 6.2  Date & Time Notied:  04/12/19 at 1557  Provider Notified: Dr. Darliss Cheney advised.  Orders Received/Actions taken: Per Dr. Doristine Bosworth, patient in dialysis, call to advise Nephrologist.  Dr. Joelyn Oms advised of results.

## 2019-04-12 NOTE — Progress Notes (Signed)
Overall stable.  Patient with appropriate postoperative pain.  No new symptoms of numbness or weakness.  Wounds healing well.  Continue current efforts.

## 2019-04-12 NOTE — Progress Notes (Signed)
Patient is transferred via bed from room 4NP13 to HD at this time. Alert and in stable condition.

## 2019-04-12 NOTE — Progress Notes (Signed)
  [facility] Progress Note   Subjective: No complaints, potentially to CIR, for HD today  Objective Vitals:   04/11/19 2004 04/11/19 2330 04/12/19 0400 04/12/19 0721  BP: (!) 153/65 140/65 120/62 (!) 155/75  Pulse: 75 66  74  Resp:      Temp: 98.3 F (36.8 C) (!) 97.5 F (36.4 C) (!) 97.5 F (36.4 C) (!) 97.5 F (36.4 C)  TempSrc: Oral Oral Oral Oral  SpO2: 100% 96% 100% 100%  Weight:      Height:       Physical Exam General: Well appearing man. Large neck brace. Room air. Heart: RRR; 2/6 SEM Lungs: CTAB Abdomen: soft, non-tender Extremities: No LE edema Dialysis Access:  L AVF, aneurysmal, + thrill  Additional Objective Labs: Basic Metabolic Panel: Recent Labs  Lab 04/08/19 0531 04/09/19 0527 04/11/19 0841  NA 133* 134* 138  K 4.5 5.5* 5.0  CL 95* 92* 97*  CO2 25 23 24   GLUCOSE 131* 134* 134*  BUN 44* 71* 88*  CREATININE 6.15* 7.92* 8.26*  CALCIUM 7.0* 7.2* 7.3*   Liver Function Tests: Recent Labs  Lab 04/07/19 0106 04/08/19 0531 04/09/19 0527  AST 24 20 25   ALT 12 7 8   ALKPHOS 157* 141* 156*  BILITOT 0.8 1.2 1.0  PROT 6.0* 5.7* 6.0*  ALBUMIN 3.1* 2.9* 3.0*   CBC: Recent Labs  Lab 04/06/19 1106 04/07/19 0106 04/08/19 0531 04/09/19 0527 04/11/19 0841  WBC 6.8 6.9 7.2 6.5 11.1*  NEUTROABS 5.8 5.7 6.2 5.5 9.3*  HGB 11.3* 10.9* 10.4* 10.8* 11.5*  HCT 34.2* 33.1* 31.9* 33.5* 35.2*  MCV 98.8 99.1 101.3* 100.9* 101.1*  PLT 86* 89* 94* 105* 161   CBG: Recent Labs  Lab 04/10/19 0212  GLUCAP 134*   Medications: . sodium chloride     . amLODipine  10 mg Oral Daily  . Chlorhexidine Gluconate Cloth  6 each Topical Q0600  . cyclobenzaprine  10 mg Oral TID  . dexamethasone (DECADRON) injection  4 mg Intravenous Q6H  . famotidine  20 mg Oral Daily  . feeding supplement (PRO-STAT SUGAR FREE 64)  30 mL Oral BID  . heparin  5,000 Units Subcutaneous Q8H  . lisinopril  20 mg Oral Daily  . sodium chloride flush  3 mL Intravenous  Q12H    Dialysis Orders: TTS at [facility]) 3hr, 450/800, 2K/2.5Ca, EDW 65kg, L AVF, no heparin - Hectoral 70mcg IV q HD  Assessment/Plan: 1. C6-C7 Fractures with myelopathy + spinal cord compression: S/p C6-C7 corpectomy/ microdissection then C5-T1 fusion by Dr. Annette Stable on 04/05/19  2. ESRD: Continue HD per TTS schedule - next 9/8. No heparin. 3. HTN/volume: BP stable; Continue home meds - 4. Anemia: Hgb stable above 10, - follow without ESA. 5. Secondary hyperparathyroidism: CorrCa ok, Phos pending.. 6. Nutrition: Alb low, continue supplements 7. Hx CHF 8. EtOH cirrhosis. 9. Debility - PT recommending CIR, eval in process

## 2019-04-12 NOTE — Progress Notes (Signed)
Physical Therapy Treatment Patient Details Name: Mario [name] MRN: [id number] DOB: [birth year] Today's Date: 04/12/2019    History of Present Illness 48 yo male s/p cervical fusion C5-C7  C6-7 fx with severe spinal stenosis with cord compression pMH: alcoholic cirrhosis, ERSD, HTN, CHF    PT Comments    Patient progressing well towards PT goals. Tolerated gait training with Min A for balance/safety. Noted to have instability in BLEs and increased trunk flexion with onset of fatigue. Difficulty recalling cervical neck precautions. Reviewed cervical brace/precautions with pt via interpreter. Encouraged walking with nursing a few times/day. Pt agrees. Motivated to mobilize. Continue to recommend CIR. Will follow.    Follow Up Recommendations  CIR     Equipment Recommendations  Rolling walker with 5" wheels;3in1 (PT)    Recommendations for Other Services       Precautions / Restrictions Precautions Precautions: Cervical Precaution Booklet Issued: No Precaution Comments: Interpreter Mario [name] [phone] Required Braces or Orthoses: Cervical Brace Cervical Brace: Hard collar;At all times Restrictions Weight Bearing Restrictions: No    Mobility  Bed Mobility Overal bed mobility: Needs Assistance Bed Mobility: Sit to Sidelying         Sit to sidelying: Supervision General bed mobility comments: HOB flat, use of rails. Cues for log roll technique.  Transfers Overall transfer level: Needs assistance Equipment used: Rolling walker (2 wheeled) Transfers: Sit to/from Stand Sit to Stand: Supervision         General transfer comment: Min guard for safety. Stood from chair x1, from [employer]. Cues for hand placement.  Ambulation/Gait Ambulation/Gait assistance: Min assist Gait Distance (Feet): 150 Feet(x2 bouts) Assistive device: Rolling walker (2 wheeled) Gait Pattern/deviations: Step-through pattern;Decreased stride length;Trunk flexed Gait velocity: decreased    General Gait Details: Slow, unsteady gait with RW for support; cues for upright posture. Bil knee instability noted. 1 seated rest break due to fatigue. Increased trunk flexion with onset of fatigue.   Stairs             Wheelchair Mobility    Modified Rankin (Stroke Patients Only)       Balance Overall balance assessment: Needs assistance Sitting-balance support: Feet supported;No upper extremity supported Sitting balance-Leahy Scale: Good     Standing balance support: During functional activity Standing balance-Leahy Scale: Poor Standing balance comment: Requires UE support for stability                            Cognition Arousal/Alertness: Awake/alert Behavior During Therapy: Flat affect Overall Cognitive Status: Impaired/Different from baseline Area of Impairment: Memory;Awareness                     Memory: Decreased recall of precautions   Safety/Judgement: Decreased awareness of safety;Decreased awareness of deficits     General Comments: Minimal recall of precautions.      Exercises      General Comments        Pertinent Vitals/Pain Pain Assessment: 0-10 Pain Score: 8  Pain Location: neck and posterior shoulders Pain Descriptors / Indicators: Discomfort;Aching;Sore Pain Intervention(s): Repositioned;Monitored during session    Home Living                      Prior Function            PT Goals (current goals can now be found in the care plan section) Progress towards PT goals: Progressing toward goals    Frequency  Min 5X/week      PT Plan Current plan remains appropriate    Co-evaluation              AM-PAC PT "6 Clicks" Mobility   Outcome Measure  Help needed turning from your back to your side while in a flat bed without using bedrails?: A Little Help needed moving from lying on your back to sitting on the side of a flat bed without using bedrails?: A Little Help needed moving to and  from a bed to a chair (including a wheelchair)?: A Little Help needed standing up from a chair using your arms (e.g., wheelchair or bedside chair)?: A Little Help needed to walk in hospital room?: A Little Help needed climbing 3-5 steps with a railing? : A Lot 6 Click Score: 17    End of Session Equipment Utilized During Treatment: Cervical collar;Gait belt Activity Tolerance: Patient tolerated treatment well Patient left: in bed;with call bell/phone within reach;with bed alarm set Nurse Communication: Mobility status PT Visit Diagnosis: Unsteadiness on feet (R26.81);Other abnormalities of gait and mobility (R26.89);Pain;Difficulty in walking, not elsewhere classified (R26.2) Pain - part of body: (neck/shoulders)     Time: 8472-0721 PT Time Calculation (min) (ACUTE ONLY): 20 min  Charges:  $Gait Training: 8-22 mins                     Mario [name], PT, DPT Acute Rehabilitation Services Pager [phone] Office [place] 04/12/2019, 11:52 AM

## 2019-04-12 NOTE — Care Management (Signed)
CM received a call from [facility]; the agency is unable to accept the [facility] referral for [facility] at this time. The patient is refusing to discuss his financial information with the liaison to access financial assistance needs. CM will continue to follow for TOC needs.  Midge Minium RN, BSN, NCM-BC, ACM-RN [phone]

## 2019-04-13 DIAGNOSIS — M4802 Spinal stenosis, cervical region: Secondary | ICD-10-CM | POA: Diagnosis present

## 2019-04-13 DIAGNOSIS — S129XXA Fracture of neck, unspecified, initial encounter: Secondary | ICD-10-CM | POA: Diagnosis present

## 2019-04-13 MED ORDER — CYCLOBENZAPRINE HCL 10 MG PO TABS: 10.0000 mg | ORAL_TABLET | Freq: Three times a day (TID) | ORAL | 0 refills | Status: DC

## 2019-04-13 MED ORDER — LISINOPRIL 20 MG PO TABS: 20.0000 mg | ORAL_TABLET | Freq: Every day | ORAL | 0 refills | Status: DC

## 2019-04-13 MED ORDER — AMLODIPINE BESYLATE 10 MG PO TABS: 10.0000 mg | ORAL_TABLET | Freq: Every day | ORAL | 0 refills | Status: DC

## 2019-04-13 MED ORDER — OXYCODONE HCL 5 MG PO TABS: 5.0000 mg | ORAL_TABLET | Freq: Four times a day (QID) | ORAL | 0 refills | Status: DC | PRN

## 2019-04-13 MED FILL — AMLODIPINE BESYLATE 10 MG T: 10 | 30 days supply | Qty: 30 | Fill #0

## 2019-04-13 MED FILL — CYCLOBENZAPRINE 10 MG TAB: 10 | 10 days supply | Qty: 30 | Fill #0

## 2019-04-13 MED FILL — oxyCODONE HCL 5 MG TABS: 5 | 4 days supply | Qty: 15 | Fill #0

## 2019-04-13 MED FILL — LISINOPRIL 20 MG TABLET: 20 | 30 days supply | Qty: 30 | Fill #0

## 2019-04-13 NOTE — Progress Notes (Signed)
Occupational Therapy Treatment Patient Details Name: Mario [name] MRN: [id number] DOB: 14-Aug-1970 Today's Date: 04/13/2019    History of present illness 48 yo male s/p cervical fusion C5-C7  C6-7 fx with severe spinal stenosis with cord compression pMH: alcoholic cirrhosis, ERSD, HTN, CHF   OT comments  Pt able to recall lifting precaution this session but unable to recall all cervical precautions. Pt needs reinforcement of all precautions. Recommending that all education is repeated to family arriving to take patient home. Pt reports having help upon d/c. Advised patient to have family help setup room to maximize independence with items chest to hip height. Pt is high fall risk for any dynamic balance task. Pt advised to bath bath only at this time due to fall risk.    Follow Up Recommendations  Home health OT    Equipment Recommendations  3 in 1 bedside commode;Other (comment)(RW)    Recommendations for Other Services      Precautions / Restrictions Precautions Precautions: Cervical Precaution Comments: interpreter Jesus # [phone] Required Braces or Orthoses: Cervical Brace Cervical Brace: Hard collar;At all times       Mobility Bed Mobility Overal bed mobility: Modified Independent                Transfers Overall transfer level: Needs assistance Equipment used: 4-wheeled walker Transfers: Sit to/from Stand Sit to Stand: Supervision              Balance           Standing balance support: No upper extremity supported;During functional activity Standing balance-Leahy Scale: Poor Standing balance comment: posterior LOB and bracing on chair                           ADL either performed or assessed with clinical judgement   ADL Overall ADL's : Needs assistance/impaired                 Upper Body Dressing : Supervision/safety   Lower Body Dressing: Minimal assistance Lower Body Dressing Details (indicate cue type and  reason): educated on sitting to dress. pt requires bil LE against chair with heavy reliance on chair for support. pt posterior lean. pt with near LOB and sitting Toilet Transfer: Supervision/safety;RW           Functional mobility during ADLs: Supervision/safety;Rolling walker General ADL Comments: pt educated on changing collar, putting on second collar for shower, educated on hygiene with use of clean linen, needing to sit for dressing and pt with no additional questions     Vision       Perception     Praxis      Cognition Arousal/Alertness: Awake/alert Behavior During Therapy: Flat affect Overall Cognitive Status: Impaired/Different from baseline Area of Impairment: Memory                     Memory: Decreased short-term memory         General Comments: pt with not recall of phildephlia collar education        Exercises     Shoulder Instructions       General Comments noted to have drainage on dressing but brace pads are clean this session    Pertinent Vitals/ Pain       Pain Assessment: 0-10 Pain Score: 5 (pt states "half way" has to be cued to give number) Pain Location: neck Pain Descriptors / Indicators: Discomfort Pain Intervention(s): Repositioned;Premedicated before  session;Monitored during session  Home Living                                          Prior Functioning/Environment              Frequency  Min 3X/week        Progress Toward Goals  OT Goals(current goals can now be found in the care plan section)  Progress towards OT goals: Progressing toward goals  Acute Rehab OT Goals Patient Stated Goal: to go home today OT Goal Formulation: With patient Time For Goal Achievement: 04/22/19 Potential to Achieve Goals: Good ADL Goals Pt Will Perform Lower Body Dressing: with modified independence Pt Will Transfer to Toilet: with modified independence Additional ADL Goal #1: Pt will don doff ccollar mod I  as precursor to adls. Additional ADL Goal #2: pt will complete bed mobility mod I as precursor to adls  Plan Discharge plan remains appropriate;Frequency remains appropriate    Co-evaluation                 AM-PAC OT "6 Clicks" Daily Activity     Outcome Measure   Help from another person eating meals?: None Help from another person taking care of personal grooming?: None Help from another person toileting, which includes using toliet, bedpan, or urinal?: A Little Help from another person bathing (including washing, rinsing, drying)?: A Little Help from another person to put on and taking off regular upper body clothing?: A Little Help from another person to put on and taking off regular lower body clothing?: A Little 6 Click Score: 20    End of Session Equipment Utilized During Treatment: Rolling walker;Cervical collar  OT Visit Diagnosis: Unsteadiness on feet (R26.81);Muscle weakness (generalized) (M62.81)   Activity Tolerance Patient tolerated treatment well   Patient Left in chair;with call bell/phone within reach;with chair alarm set   Nurse Communication Mobility status;Precautions        Time: [phone] OT Time Calculation (min): 20 min  Charges: OT General Charges $OT Visit: 1 Visit OT Treatments $Self Care/Home Management : 8-22 mins   Jeri Modena, OTR/L  Acute Rehabilitation Services Pager: [phone] Office: [phone] .    Jeri Modena 04/13/2019, 11:18 AM

## 2019-04-13 NOTE — Progress Notes (Signed)
Looks good this morning.  Pain well controlled.  No new neurologic symptoms.  Wounds clean and dry.  Patient doing well following anterior posterior cervical surgery.  Continue efforts at rehabilitation and mobilization.  Okay for discharge planning from my standpoint.

## 2019-04-13 NOTE — Progress Notes (Signed)
  [facility] Progress Note   Subjective:  HD yesterday, 2.5 L removed Patient states is to be discharged today  Objective Vitals:   04/12/19 2018 04/13/19 0003 04/13/19 0439 04/13/19 0843  BP: 138/77 (!) 169/60 138/68 (!) 151/71  Pulse: 91 80 76 78  Resp:    16  Temp: 97.9 F (36.6 C) 98.4 F (36.9 C) 98.2 F (36.8 C) 98.5 F (36.9 C)  TempSrc: Oral Oral Oral Oral  SpO2: 96% 100% 99% 100%  Weight:      Height:       Physical Exam General: Well appearing man. Large neck brace. Room air. Heart: RRR; 2/6 SEM Lungs: CTAB Abdomen: soft, non-tender Extremities: No LE edema Dialysis Access:  L AVF, aneurysmal, + thrill  Additional Objective Labs: Basic Metabolic Panel: Recent Labs  Lab 04/09/19 0527 04/11/19 0841 04/11/19 1109 04/12/19 1349  NA 134* 138  --  132*  K 5.5* 5.0  --  5.8*  CL 92* 97*  --  95*  CO2 23 24  --  18*  GLUCOSE 134* 134*  --  136*  BUN 71* 88*  --  120*  CREATININE 7.92* 8.26*  --  10.01*  CALCIUM 7.2* 7.3*  --  6.2*  PHOS  --   --  6.9* 6.7*   Liver Function Tests: Recent Labs  Lab 04/07/19 0106 04/08/19 0531 04/09/19 0527 04/12/19 1349  AST 24 20 25   --   ALT 12 7 8   --   ALKPHOS 157* 141* 156*  --   BILITOT 0.8 1.2 1.0  --   PROT 6.0* 5.7* 6.0*  --   ALBUMIN 3.1* 2.9* 3.0* 3.0*   CBC: Recent Labs  Lab 04/07/19 0106 04/08/19 0531 04/09/19 0527 04/11/19 0841 04/12/19 1349  WBC 6.9 7.2 6.5 11.1* 9.1  NEUTROABS 5.7 6.2 5.5 9.3*  --   HGB 10.9* 10.4* 10.8* 11.5* 14.7  HCT 33.1* 31.9* 33.5* 35.2* 45.3  MCV 99.1 101.3* 100.9* 101.1* 100.9*  PLT 89* 94* 105* 161 113*   CBG: Recent Labs  Lab 04/10/19 0212  GLUCAP 134*   Medications: . sodium chloride     . amLODipine  10 mg Oral Daily  . calcium carbonate  400 mg of elemental calcium Oral QHS  . Chlorhexidine Gluconate Cloth  6 each Topical Q0600  . cyclobenzaprine  10 mg Oral TID  . dexamethasone (DECADRON) injection  4 mg Intravenous Q6H  .  famotidine  20 mg Oral Daily  . feeding supplement (PRO-STAT SUGAR FREE 64)  30 mL Oral BID  . heparin  5,000 Units Subcutaneous Q8H  . lisinopril  20 mg Oral Daily  . sodium chloride flush  3 mL Intravenous Q12H    Dialysis Orders: TTS at [facility]) 3hr, 450/800, 2K/2.5Ca, EDW 65kg, L AVF, no heparin - Hectoral 38mcg IV q HD  Assessment/Plan: 1. C6-C7 Fractures with myelopathy + spinal cord compression: S/p C6-C7 corpectomy/ microdissection then C5-T1 fusion by Dr. Annette Stable on 04/05/19  2. ESRD: Continue HD per TTS schedule - next 9/10 if inpatient. No heparin. 3. HTN/volume: BP stable; Continue home meds - 4. Anemia: Hgb stable above 10, - follow without ESA. 5. Secondary hyperparathyroidism: CorrCa ok, mild hyperphosphatemia 6. Nutrition: Alb low, continue supplements 7. Hx CHF 8. EtOH cirrhosis. 9. Debility -appears going home today can go to outpatient dialysis tomorrow

## 2019-04-13 NOTE — Progress Notes (Addendum)
Physical Therapy Treatment Patient Details Name: Mario [name] MRN: [id number] DOB: [birth year] Today's Date: 04/13/2019    History of Present Illness 48 yo male s/p cervical fusion C5-C7  C6-7 fx with severe spinal stenosis with cord compression pMH: alcoholic cirrhosis, ERSD, HTN, CHF    PT Comments    Pt progressing steadily towards physical therapy goals. Ambulating 200 feet with walker, supervision for safety. Still with moderate reliance through arms on walker, recommended to pt using walker for all mobility. Reinforced cervical precautions, brace use. Pt with noted decreased short term memory recall. Continue to recommend HHPT at discharge.     Follow Up Recommendations  Home health PT;Supervision for mobility/OOB     Equipment Recommendations  Rolling walker with 5" wheels;3in1 (PT)    Recommendations for Other Services       Precautions / Restrictions Precautions Precautions: Cervical Precaution Comments: interpreter Itzel [phone] Required Braces or Orthoses: Cervical Brace Cervical Brace: Hard collar;At all times Restrictions Weight Bearing Restrictions: No    Mobility  Bed Mobility Overal bed mobility: Modified Independent             General bed mobility comments: OOB in chair  Transfers Overall transfer level: Needs assistance Equipment used: Rolling walker (2 wheeled) Transfers: Sit to/from Stand Sit to Stand: Supervision         General transfer comment: preferring to pull up on walker  Ambulation/Gait Ambulation/Gait assistance: Supervision Gait Distance (Feet): 200 Feet Assistive device: Rolling walker (2 wheeled) Gait Pattern/deviations: Step-through pattern;Decreased stride length;Trunk flexed Gait velocity: decreased   General Gait Details: Cues for scapular depression but unable to significantly change, continued heavier reliance through arms on walker   Stairs             Wheelchair Mobility    Modified Rankin  (Stroke Patients Only)       Balance           Standing balance support: During functional activity;Bilateral upper extremity supported Standing balance-Leahy Scale: Poor Standing balance comment: posterior LOB and bracing on chair                            Cognition Arousal/Alertness: Awake/alert Behavior During Therapy: Flat affect Overall Cognitive Status: Impaired/Different from baseline Area of Impairment: Memory                     Memory: Decreased short-term memory         General Comments: pt with not recall of phildephlia collar education      Exercises      General Comments General comments (skin integrity, edema, etc.): noted to have drainage on dressing but brace pads are clean this session      Pertinent Vitals/Pain Pain Assessment: 0-10 Pain Score: 5  Pain Location: neck Pain Descriptors / Indicators: Discomfort Pain Intervention(s): Premedicated before session;Monitored during session    Home Living                      Prior Function            PT Goals (current goals can now be found in the care plan section) Acute Rehab PT Goals Patient Stated Goal: to go home today Potential to Achieve Goals: Good Progress towards PT goals: Progressing toward goals    Frequency    Min 5X/week      PT Plan Current plan remains appropriate    Co-evaluation  AM-PAC PT "6 Clicks" Mobility   Outcome Measure  Help needed turning from your back to your side while in a flat bed without using bedrails?: None Help needed moving from lying on your back to sitting on the side of a flat bed without using bedrails?: None Help needed moving to and from a bed to a chair (including a wheelchair)?: None Help needed standing up from a chair using your arms (e.g., wheelchair or bedside chair)?: None Help needed to walk in hospital room?: None Help needed climbing 3-5 steps with a railing? : A Little 6 Click  Score: 23    End of Session Equipment Utilized During Treatment: Cervical collar Activity Tolerance: Patient tolerated treatment well Patient left: in bed;with call bell/phone within reach Nurse Communication: Mobility status PT Visit Diagnosis: Unsteadiness on feet (R26.81);Other abnormalities of gait and mobility (R26.89);Pain;Difficulty in walking, not elsewhere classified (R26.2)     Time: 5521-7471 PT Time Calculation (min) (ACUTE ONLY): 10 min  Charges:  $Gait Training: 8-22 mins                     Ellamae Sia, [place], DPT Acute Rehabilitation Services Pager [phone] Office [phone]    Willy Eddy 04/13/2019, 12:41 PM

## 2019-04-13 NOTE — Discharge Instructions (Signed)
Spinal Stenosis  Spinal stenosis happens when the open space (spinal canal) between the bones of your spine (vertebrae) gets smaller. It is caused by bone pushing into the open spaces of your backbone (spine). This puts pressure on your backbone and the nerves in your backbone. Treatment often focuses on managing any pain and symptoms. In some cases, surgery may be needed. Follow these instructions at home: Managing pain, stiffness, and swelling   Do all exercises and stretches as told by your doctor.  Stand and sit up straight (use good posture). If you were given a brace or a corset, wear it as told by your doctor.  Do not do any activities that cause pain. Ask your doctor what activities are safe for you.  Do not lift anything that is heavier than 10 lb (4.5 kg) or heavier than your doctor tells you.  Try to stay at a healthy weight. Talk with your doctor if you need help losing weight.  If directed, put heat on the affected area as often as told by your doctor. Use the heat source that your doctor recommends, such as a moist heat pack or a heating pad. ? Put a towel between your skin and the heat source. ? Leave the heat on for 20-30 minutes. ? Remove the heat if your skin turns bright red. This is especially important if you are not able to feel pain, heat, or cold. You may have a greater risk of getting burned. General instructions  Take over-the-counter and prescription medicines only as told by your doctor.  Do not use any products that contain nicotine or tobacco, such as cigarettes and e-cigarettes. If you need help quitting, ask your doctor.  Eat a healthy diet. This includes plenty of fruits and vegetables, whole grains, and low-fat (lean) protein.  Keep all follow-up visits as told by your doctor. This is important. Contact a doctor if:  Your symptoms do not get better.  Your symptoms get worse.  You have a fever. Get help right away if:  You have new or worse pain  in your neck or upper back.  You have very bad pain that medicine does not control.  You are dizzy.  You have vision problems, blurred vision, or double vision.  You have a very bad headache that is worse when you stand.  You feel sick to your stomach (nauseous).  You throw up (vomit).  You have new or worse numbness or tingling in your back or legs.  You have pain, redness, swelling, or warmth in your arm or leg. Summary  Spinal stenosis happens when the open space (spinal canal) between the bones of your spine gets smaller (narrow).  Contact a doctor if your symptoms get worse.  In some cases, surgery may be needed. This information is not intended to replace advice given to you by your health care provider. Make sure you discuss any questions you have with your health care provider. Document Released: 11/14/2010 Document Revised: 07/03/2017 Document Reviewed: 06/25/2016 Elsevier Patient Education  2020 [employer].

## 2019-04-13 NOTE — Discharge Summary (Signed)
Physician Discharge Summary  Mario [name] FEO:[id number] DOB: 02/03/1971 DOA: 04/03/2019  PCP: Mario Legato, MD  Admit date: 04/03/2019 Discharge date: 04/13/2019  Admitted From: Home Disposition: Home  Recommendations for Outpatient Follow-up:  1. Follow up with PCP in 1-2 weeks 2. Follow-up with neurosurgery in 2 to 3 weeks 3. Please obtain BMP/CBC in one week 4. Please follow up on the following pending results:  Home Health: Yes Equipment/Devices: Rolling walker and commode  Discharge Condition: Stable CODE STATUS: Full code Diet recommendation: Cardiac  Subjective: Patient seen and examined.  Continues to complain of anterior neck pain and shoulder pain.  No other complaint.  Denies any chest pain or shortness of breath.  Brief/Interim Summary: 48 year old Spanish-speaking male, lives alone, independent, PMH of ESRD on TTS HD via LUE AVF, chronic combined systolic and diastolic CHF, LVEF 32% by TTE in 2017, mild mitral regurgitation, cirrhosis, HTN, initially admitted to [facility] on 04/01/2019 due to progressively worsening neck/upper back pain with radiation to bilateral upper extremity with associated tingling, numbness and upper extremity weakness, left> right.  CT cervical spine showed erosive changes compatible with renal spondyloarthropathy at C3-4, C6-7, C7-T1.  Patient was transferred to [facility] for Neurosurgical consultation.  Better after steroids and cervical collar.  Neurosurgery requested preop cardiology clearance which was provided by cardiology.  Patient was dialyzed before the surgery and then on 04/05/2019 he underwent anterior cervical corpectomy, microdissection, posterior cervical fusion.  Patient continued to receive his scheduled HD with nephrology.  He was evaluated by PT OT and they recommended CIR however patient decided to go home with home health.  Rolling walker and commode has been arranged for him.  I discussed case with Dr. Annette Stable from neurosurgery and he  did not recommend any dexamethasone to go home with.  I am prescribing him a few tablets of pain medications.  Of note, patient's blood pressure was elevated during this hospitalization so he was started on antihypertensives, amlodipine first and then lisinopril.  These have controlled his blood pressure so I am going to discharge him on those as well.  He needs to follow-up with neurosurgery in 2 to 3 weeks and PCP within a week.  He needs to continue to wear Aspen collar in the neck until seen by neurosurgery.  Discharge Diagnoses:  Principal Problem:   Cervical radiculopathy Active Problems:   ESRD (end stage renal disease) on dialysis (HCC)   HTN (hypertension)   Neck pain   Cervical spinal stenosis   Cervical vertebral fracture [facility])    Discharge Instructions  Discharge Instructions    Discharge patient   Complete by: As directed    Discharge disposition: 06-Home-Health Care Svc   Discharge patient date: 04/13/2019     Allergies as of 04/13/2019      Reactions   Betadine [povidone Iodine] Rash      Medication List    TAKE these medications   acetaminophen 500 MG tablet Commonly known as: TYLENOL Take 500 mg by mouth every 6 (six) hours as needed for mild pain.   amLODipine 10 MG tablet Commonly known as: NORVASC Take 1 tablet (10 mg total) by mouth daily. Start taking on: April 14, 2019   Auryxia 1 GM 210 MG(Fe) tablet Generic drug: ferric citrate Take 420 mg by mouth 3 (three) times daily with meals.   cyclobenzaprine 10 MG tablet Commonly known as: FLEXERIL Take 1 tablet (10 mg total) by mouth 3 (three) times daily.   lisinopril 20 MG tablet Commonly known  as: ZESTRIL Take 1 tablet (20 mg total) by mouth daily. Start taking on: April 14, 2019   oxyCODONE 5 MG immediate release tablet Commonly known as: Oxy IR/ROXICODONE Take 1 tablet (5 mg total) by mouth every 6 (six) hours as needed for up to 15 doses for moderate pain.            Durable  Medical Equipment  (From admission, onward)         Start     Ordered   04/12/19 1312  For home use only DME Walker rolling  Once    Question:  Patient needs a walker to treat with the following condition  Answer:  Balance disorder   04/12/19 1311         Follow-up Information    Gollan, Mario November, MD Follow up.   Specialty: Cardiology Why: [place] Comment hemos programado Mario [name] cita con un cardiologo de [facility] en [place] 07/11/19 - 11am. Contact information: [place] [address] [phone]        Mario Legato, MD Follow up in 1 week(s).   Specialty: Internal Medicine Contact information: [place] [address] [phone]        Mario Spark, MD .   Specialty: Cardiology Contact information: [place] [id number] [phone]          Allergies  Allergen Reactions  . Betadine [Povidone Iodine] Rash    Consultations: Neurosurgery   Procedures/Studies: Dg Cervical Spine 2-3 Views  Result Date: 04/05/2019 CLINICAL DATA:  Cervical fusion EXAM: CERVICAL SPINE - 2-3 VIEW; DG C-ARM 1-60 MIN COMPARISON:  None. FLUOROSCOPY TIME:  Radiation Exposure Index (as provided by the fluoroscopic device): Not available If the device does not provide the exposure index: Fluoroscopy Time:  43 seconds Number of Acquired Images:  5 FINDINGS: Initial images demonstrate a surgical instrument anteriorly at what appears to be C6-7 at the area of vertebral collapse. Subsequent films demonstrate evidence of interbody fusion at C5-6 and C6-7 with anterior fixation. Subsequently posterior fixation elements are noted from C5-T1. IMPRESSION: Cervical fusion both anteriorly and posteriorly. Electronically Signed   By: Mario [name] M.D.   On: 04/05/2019 14:53   Ct Cervical Spine Wo Contrast  Result Date: 04/01/2019 CLINICAL DATA:  Neck pain. Bilateral arm numbness. Dialysis patient EXAM: CT CERVICAL SPINE  WITHOUT CONTRAST TECHNIQUE: Multidetector CT imaging of the cervical spine was performed without intravenous contrast. Multiplanar CT image reconstructions were also generated. COMPARISON:  CT cervical spine 03/23/2018 FINDINGS: Alignment: Mild retrolisthesis C3-4. Mild anterolisthesis C4-5. Marked kyphosis centered at C6-7 measuring 31 degrees. Kyphosis has progressed since the prior study due to progressive erosive changes of C6 and C7 vertebral bodies. Skull base and vertebrae: Erosive endplate changes at G2-5, C6-7, C7-T1 most likely due to spondyloarthropathy of dialysis. Progressive changes at C6-7 since the prior CT Soft tissues and spinal canal: Negative Disc levels:  C2-3: Negative C3-4: Well corticated erosive changes in the endplates at W3-8 similar to the prior study. No significant spinal stenosis C4-5: Negative C5-6: Negative C6-7: Erosive changes in the endplates bilaterally has progressed in the interval. There are erosive changes with loss of vertebral body volume of C6 and C7. Only a small amount of vertebral bodies present in the inferior endplate of C7. There is marked kyphosis at this level. Posterior spurring is present with mild spinal stenosis. Left facet subluxation with either degenerative or erosive changes in the  facet. Possible instability. C7-T1: Erosive endplate changes similar to the prior study. 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 Marked progression of bony erosion at C6-7 with 31 degrees of kyphosis which has progressed in the interval. Subluxation of the left C6-7 facet which may be degenerative or due to erosive arthropathy. Surgical consultation is suggested. These results were called by telephone at the time of interpretation on 04/01/2019 at 3:13 pm to Dr. Quentin Cornwall , who verbally acknowledged these results. Electronically Signed    By: Franchot Gallo M.D.   On: 04/01/2019 15:14   Mr Cervical Spine Wo Contrast  Result Date: 04/03/2019 CLINICAL DATA:  Instead age renal disease on chronic hemodialysis. Progressive neck pain with upper extremity pain, tingling and weakness. Pathologic fracture. EXAM: MRI CERVICAL SPINE WITHOUT CONTRAST TECHNIQUE: Multiplanar, multisequence MR imaging of the cervical spine was performed. No intravenous contrast was administered. COMPARISON:  CT 04/01/2019 and 03/23/2018 FINDINGS: Alignment: Kyphotic curvature of the spine at C6-7. Vertebrae: Chronic endplate irregularity at C3-4. Chronic but progressive collapse of the C6 and C7 vertebral bodies with kyphotic angulation and retropulsion. Chronic but progressive superior endplate deformity at T1. Cord: Cord indentation at C3-4 and C4-5 because of spinal stenosis with AP diameter of the canal 7-7.5 mm. Spinal stenosis at C6-7 with AP diameter of the canal only 6.3 mm with cord deformity. Abnormal T2 signal cord. Posterior Fossa, vertebral arteries, paraspinal tissues: Otherwise negative Disc levels: Foramen magnum is widely patent.  C1-2 and C2-3 are normal. C3-4: Chronic spondylosis/spondyloarthropathy. Bulging of the disc. Mild ligamentous hypertrophy. Spinal stenosis with AP diameter 7.3 mm. Slight indentation of cord. Bilateral foraminal stenosis. C4-5: Bulging of the disc. Ligamentous hypertrophy. Spinal stenosis with AP diameter of the canal 7.6 mm. Slight indentation of the cord. Mild foraminal narrowing right more than left. C5-6: Mild bulging of the disc. No compressive canal or foraminal stenosis. C6-7: Vertebral body collapse and kyphotic angulation as described above. Spinal stenosis with AP diameter of the canal only 6.3 mm. Cord deformity and increased T2 cord signal. Bilateral foraminal stenosis. C7-T1: Mild bulging of the disc. Superior endplate deformity at T1. Canal and foramina sufficiently patent. Upper thoracic region negative. IMPRESSION:  Likely dialysis related spondyloarthropathy with vertebral body collapse and kyphotic curvature at C6 and C7. Canal stenosis with AP diameter only 6.3 mm, effacing the subarachnoid space and deforming the cord without abnormal T2 signal within the cord. Severe bilateral foraminal stenosis at this level. Findings are progressive over time. Chronic spondyloarthropathy at C3-4 without progressive change since last year. Canal stenosis with AP diameter measuring 7.3 mm. Effacement of the subarachnoid space and slight indentation of the cord. Bilateral foraminal stenosis. C4-5 disc bulge. Canal stenosis with AP diameter of 7.6 mm. Slight indentation of the cord. No foraminal stenosis. Progressive superior endplate deformity at T1 over time but without canal or foraminal stenosis. Electronically Signed   By: Nelson Chimes M.D.   On: 04/03/2019 13:27   Dg Chest Portable 1 View  Result Date: 04/01/2019 CLINICAL DATA:  Pain between the shoulder blades and intermittent bilateral arm numbness for 2 weeks. EXAM: PORTABLE CHEST 1 VIEW COMPARISON:  Single-view of the chest 05/01/2018. FINDINGS: There is cardiomegaly and mild interstitial edema. No consolidative process, pneumothorax or effusion. Atherosclerosis noted. No acute or focal bony abnormality. IMPRESSION: Cardiomegaly mild interstitial edema. Atherosclerosis. Electronically Signed   By: Inge Rise M.D.   On:  04/01/2019 11:15   Dg C-arm 1-60 Min  Result Date: 04/05/2019 CLINICAL DATA:  Cervical fusion EXAM: CERVICAL SPINE - 2-3 VIEW; DG C-ARM 1-60 MIN COMPARISON:  None. FLUOROSCOPY TIME:  Radiation Exposure Index (as provided by the fluoroscopic device): Not available If the device does not provide the exposure index: Fluoroscopy Time:  43 seconds Number of Acquired Images:  5 FINDINGS: Initial images demonstrate a surgical instrument anteriorly at what appears to be C6-7 at the area of vertebral collapse. Subsequent films demonstrate evidence of interbody  fusion at C5-6 and C6-7 with anterior fixation. Subsequently posterior fixation elements are noted from C5-T1. IMPRESSION: Cervical fusion both anteriorly and posteriorly. Electronically Signed   By: Mario [name] M.D.   On: 04/05/2019 14:53      Discharge Exam: Vitals:   04/13/19 0843 04/13/19 1235  BP: (!) 151/71 135/71  Pulse: 78 77  Resp: 16 18  Temp: 98.5 F (36.9 C) 98.2 F (36.8 C)  SpO2: 100% 100%   Vitals:   04/13/19 0003 04/13/19 0439 04/13/19 0843 04/13/19 1235  BP: (!) 169/60 138/68 (!) 151/71 135/71  Pulse: 80 76 78 77  Resp:   16 18  Temp: 98.4 F (36.9 C) 98.2 F (36.8 C) 98.5 F (36.9 C) 98.2 F (36.8 C)  TempSrc: Oral Oral Oral Oral  SpO2: 100% 99% 100% 100%  Weight:      Height:        General: Pt is alert, awake, not in acute distress Cardiovascular: RRR, S1/S2 +, no rubs, no gallops Respiratory: CTA bilaterally, no wheezing, no rhonchi Abdominal: Soft, NT, ND, bowel sounds + Extremities: no edema, no cyanosis Neuro: Alert and oriented x4.  Moving all extremities with no weakness.  No focal deficit.    The results of significant diagnostics from this hospitalization (including imaging, microbiology, ancillary and laboratory) are listed below for reference.     Microbiology: No results found for this or any previous visit (from the past 240 hour(s)).   Labs: BNP (last 3 results) No results for input(s): BNP in the last 8760 hours. Basic Metabolic Panel: Recent Labs  Lab 04/07/19 0106 04/08/19 0531 04/09/19 0527 04/11/19 0841 04/11/19 1109 04/12/19 1349  NA 134* 133* 134* 138  --  132*  K 5.4* 4.5 5.5* 5.0  --  5.8*  CL 96* 95* 92* 97*  --  95*  CO2 21* 25 23 24   --  18*  GLUCOSE 140* 131* 134* 134*  --  136*  BUN 72* 44* 71* 88*  --  120*  CREATININE 8.50* 6.15* 7.92* 8.26*  --  10.01*  CALCIUM 7.0* 7.0* 7.2* 7.3*  --  6.2*  MG  --  2.4 2.6* 2.7*  --   --   PHOS  --   --   --   --  6.9* 6.7*   Liver Function Tests: Recent Labs   Lab 04/07/19 0106 04/08/19 0531 04/09/19 0527 04/12/19 1349  AST 24 20 25   --   ALT 12 7 8   --   ALKPHOS 157* 141* 156*  --   BILITOT 0.8 1.2 1.0  --   PROT 6.0* 5.7* 6.0*  --   ALBUMIN 3.1* 2.9* 3.0* 3.0*   No results for input(s): LIPASE, AMYLASE in the last 168 hours. No results for input(s): AMMONIA in the last 168 hours. CBC: Recent Labs  Lab 04/07/19 0106 04/08/19 0531 04/09/19 0527 04/11/19 0841 04/12/19 1349  WBC 6.9 7.2 6.5 11.1* 9.1  NEUTROABS 5.7 6.2 5.5  9.3*  --   HGB 10.9* 10.4* 10.8* 11.5* 14.7  HCT 33.1* 31.9* 33.5* 35.2* 45.3  MCV 99.1 101.3* 100.9* 101.1* 100.9*  PLT 89* 94* 105* 161 113*   Cardiac Enzymes: No results for input(s): CKTOTAL, CKMB, CKMBINDEX, TROPONINI in the last 168 hours. BNP: Invalid input(s): POCBNP CBG: Recent Labs  Lab 04/10/19 0212  GLUCAP 134*   D-Dimer No results for input(s): DDIMER in the last 72 hours. Hgb A1c No results for input(s): HGBA1C in the last 72 hours. Lipid Profile No results for input(s): CHOL, HDL, LDLCALC, TRIG, CHOLHDL, LDLDIRECT in the last 72 hours. Thyroid function studies No results for input(s): TSH, T4TOTAL, T3FREE, THYROIDAB in the last 72 hours.  Invalid input(s): FREET3 Anemia work up No results for input(s): VITAMINB12, FOLATE, FERRITIN, TIBC, IRON, RETICCTPCT in the last 72 hours. Urinalysis No results found for: COLORURINE, APPEARANCEUR, LABSPEC, [place], GLUCOSEU, HGBUR, BILIRUBINUR, KETONESUR, PROTEINUR, UROBILINOGEN, NITRITE, LEUKOCYTESUR Sepsis Labs Invalid input(s): PROCALCITONIN,  WBC,  LACTICIDVEN Microbiology No results found for this or any previous visit (from the past 240 hour(s)).   Time coordinating discharge: Over 30 minutes  SIGNED:   Darliss Cheney, MD  [facility] 04/13/2019, 1:15 PM Pager [phone]  If 7PM-7AM, please contact night-coverage [email or web address] Password [email or web address]

## 2019-04-13 NOTE — TOC Progression Note (Signed)
Transition of Care [facility]) - Progression Note    Patient Details  Name: Mario [name] MRN: [id number] Date of Birth: 1971/01/12  Transition of Care [facility]) CM/SW [place], [place] Phone Number: 04/13/2019, 12:42 PM  Clinical Narrative:      [place] for PT/OT/RN has been set up with Tiffany at [place] at [facility]. DME 3 in 1 and rolling walker arranged with Zack at [facility], equipment will be delivered to the patient's room.   CSW called and spoke with the patient's niece. Melissa at [phone] to advise the patient is discharging today. Including she will receive a call from [facility] regarding patient's [place] arrangement. Melissa states she will pick up patient when ready for discharge.   Thurmond Butts, MSW, [facility] Clinical Social Worker [phone]     Expected Discharge Plan: [place] Barriers to Discharge: Barriers Resolved  Expected Discharge Plan and Services Expected Discharge Plan: [place] In-house Referral: Clinical Social Work     Living arrangements for the past 2 months: Single Family Home Expected Discharge Date: (Pending)               DME Arranged: 3-N-1, Walker rolling DME Agency: [employer] Date DME Agency Contacted: 04/13/19 Time DME Agency Contacted: [phone] Representative spoke with at DME Agency: Zack HH Arranged: PT, OT, RN [place] Agency: ([facility] [place]) Date Cut Off: 04/13/19 Time [place]: 1242 Representative spoke with at [place]: [place] ([place]) Interventions    Readmission Risk Interventions No flowsheet data found.

## 2019-04-13 NOTE — Care Management (Signed)
MATCH completed for Rx needs for [facility] pharmacy.  Midge Minium RN, BSN, NCM-BC, ACM-RN [phone]

## 2019-04-14 LAB — PARATHYROID HORMONE, INTACT (NO CA)

## 2019-04-15 ENCOUNTER — Other Ambulatory Visit: Payer: Self-pay

## 2019-04-15 ENCOUNTER — Inpatient Hospital Stay (HOSPITAL_COMMUNITY)
Admission: EM | Admit: 2019-04-15 | Discharge: 2019-04-21 | DRG: 947 | Disposition: A | Discharge status: Home / Self Care | Payer: Medicaid Other | Attending: Internal Medicine | Admitting: Internal Medicine

## 2019-04-15 ENCOUNTER — Encounter (HOSPITAL_COMMUNITY): Payer: Self-pay | Admitting: *Deleted

## 2019-04-15 DIAGNOSIS — D638 Anemia in other chronic diseases classified elsewhere: Secondary | ICD-10-CM | POA: Diagnosis present

## 2019-04-15 DIAGNOSIS — Z833 Family history of diabetes mellitus: Secondary | ICD-10-CM

## 2019-04-15 DIAGNOSIS — N2581 Secondary hyperparathyroidism of renal origin: Secondary | ICD-10-CM | POA: Diagnosis present

## 2019-04-15 DIAGNOSIS — N186 End stage renal disease: Secondary | ICD-10-CM

## 2019-04-15 DIAGNOSIS — I10 Essential (primary) hypertension: Secondary | ICD-10-CM | POA: Diagnosis present

## 2019-04-15 DIAGNOSIS — M4802 Spinal stenosis, cervical region: Secondary | ICD-10-CM | POA: Diagnosis present

## 2019-04-15 DIAGNOSIS — D72829 Elevated white blood cell count, unspecified: Secondary | ICD-10-CM | POA: Diagnosis present

## 2019-04-15 DIAGNOSIS — K7030 Alcoholic cirrhosis of liver without ascites: Secondary | ICD-10-CM | POA: Diagnosis present

## 2019-04-15 DIAGNOSIS — N250 Renal osteodystrophy: Secondary | ICD-10-CM | POA: Diagnosis present

## 2019-04-15 DIAGNOSIS — Z841 Family history of disorders of kidney and ureter: Secondary | ICD-10-CM

## 2019-04-15 DIAGNOSIS — D649 Anemia, unspecified: Secondary | ICD-10-CM

## 2019-04-15 DIAGNOSIS — M62838 Other muscle spasm: Secondary | ICD-10-CM | POA: Diagnosis present

## 2019-04-15 DIAGNOSIS — R6883 Chills (without fever): Secondary | ICD-10-CM | POA: Diagnosis present

## 2019-04-15 DIAGNOSIS — G8918 Other acute postprocedural pain: Principal | ICD-10-CM | POA: Diagnosis present

## 2019-04-15 DIAGNOSIS — D6959 Other secondary thrombocytopenia: Secondary | ICD-10-CM | POA: Diagnosis present

## 2019-04-15 DIAGNOSIS — R531 Weakness: Secondary | ICD-10-CM | POA: Diagnosis present

## 2019-04-15 DIAGNOSIS — Z981 Arthrodesis status: Secondary | ICD-10-CM

## 2019-04-15 DIAGNOSIS — Z20828 Contact with and (suspected) exposure to other viral communicable diseases: Secondary | ICD-10-CM | POA: Diagnosis present

## 2019-04-15 DIAGNOSIS — M542 Cervicalgia: Secondary | ICD-10-CM | POA: Diagnosis present

## 2019-04-15 DIAGNOSIS — Z720 Tobacco use: Secondary | ICD-10-CM

## 2019-04-15 DIAGNOSIS — R51 Headache: Secondary | ICD-10-CM | POA: Diagnosis present

## 2019-04-15 DIAGNOSIS — Z888 Allergy status to other drugs, medicaments and biological substances status: Secondary | ICD-10-CM

## 2019-04-15 DIAGNOSIS — I5042 Chronic combined systolic (congestive) and diastolic (congestive) heart failure: Secondary | ICD-10-CM | POA: Diagnosis present

## 2019-04-15 DIAGNOSIS — I132 Hypertensive heart and chronic kidney disease with heart failure and with stage 5 chronic kidney disease, or end stage renal disease: Secondary | ICD-10-CM | POA: Diagnosis present

## 2019-04-15 DIAGNOSIS — Z79899 Other long term (current) drug therapy: Secondary | ICD-10-CM

## 2019-04-15 DIAGNOSIS — D631 Anemia in chronic kidney disease: Secondary | ICD-10-CM

## 2019-04-15 DIAGNOSIS — Z992 Dependence on renal dialysis: Secondary | ICD-10-CM

## 2019-04-15 DIAGNOSIS — F17210 Nicotine dependence, cigarettes, uncomplicated: Secondary | ICD-10-CM | POA: Diagnosis present

## 2019-04-15 LAB — COMPREHENSIVE METABOLIC PANEL
ALT: 28 U/L (ref 0–44)
AST: 29 U/L (ref 15–41)
Albumin: 3.1 g/dL — ABNORMAL LOW (ref 3.5–5.0)
Alkaline Phosphatase: 158 U/L — ABNORMAL HIGH (ref 38–126)
Anion gap: 16 — ABNORMAL HIGH (ref 5–15)
BUN: 77 mg/dL — ABNORMAL HIGH (ref 6–20)
CO2: 22 mmol/L (ref 22–32)
Calcium: 7 mg/dL — ABNORMAL LOW (ref 8.9–10.3)
Chloride: 93 mmol/L — ABNORMAL LOW (ref 98–111)
Creatinine, Ser: 7.67 mg/dL — ABNORMAL HIGH (ref 0.61–1.24)
GFR calc Af Amer: 9 mL/min — ABNORMAL LOW (ref >60)
GFR calc non Af Amer: 8 mL/min — ABNORMAL LOW (ref >60)
Glucose, Bld: 91 mg/dL (ref 70–99)
Potassium: 5.4 mmol/L — ABNORMAL HIGH (ref 3.5–5.1)
Sodium: 131 mmol/L — ABNORMAL LOW (ref 135–145)
Total Bilirubin: 1.3 mg/dL — ABNORMAL HIGH (ref 0.3–1.2)
Total Protein: 5.8 g/dL — ABNORMAL LOW (ref 6.5–8.1)

## 2019-04-15 LAB — CBC
HCT: 39.8 % (ref 39.0–52.0)
Hemoglobin: 12.7 g/dL — ABNORMAL LOW (ref 13.0–17.0)
MCH: 33 pg (ref 26.0–34.0)
MCHC: 31.9 g/dL (ref 30.0–36.0)
MCV: 103.4 fL — ABNORMAL HIGH (ref 80.0–100.0)
Platelets: 126 10*3/uL — ABNORMAL LOW (ref 150–400)
RBC: 3.85 MIL/uL — ABNORMAL LOW (ref 4.22–5.81)
RDW: 17.1 % — ABNORMAL HIGH (ref 11.5–15.5)
WBC: 13.7 10*3/uL — ABNORMAL HIGH (ref 4.0–10.5)
nRBC: 0 % (ref 0.0–0.2)

## 2019-04-15 MED ORDER — SODIUM CHLORIDE 0.9% FLUSH
3.0000 mL | Freq: Once | INTRAVENOUS | Status: AC
  Administered 2019-04-15: 3 mL via INTRAVENOUS

## 2019-04-15 MED ORDER — HYDROMORPHONE HCL 1 MG/ML IJ SOLN
1.0000 mg | Freq: Once | INTRAMUSCULAR | Status: AC
  Administered 2019-04-15: 1 mg via INTRAVENOUS
  Filled 2019-04-15: qty 1

## 2019-04-15 NOTE — ED Triage Notes (Signed)
The pt arrived by [facility] from home he had neck surgery last Tuesday.. he has insision drainage from his neck since the surgery no temp  He was told to come here by his surgeon  mujch pain last oxycocone was 1300 today every 6 hours

## 2019-04-15 NOTE — Consult Note (Signed)
Reason for Consult:worsening neck pain, numbness and weakness Referring Physician: Georgiann Cocker Mario [name] is an 48 y.o. male.  HPI: Patient is 10 days post neck surgery.  Anterior and posterior surgery per Dr. Annette Stable.  States pain is uncontrolled by home medicines.  Has tingling in both hands.  States may be some weakness.  States really unchanged since he left the hospital however.  No frank fever but has had chills.  States he called the nurse and was told to come into the hospital.  No headache.  He is on oxycodone every 6 hours that really does not help the pain.  No trouble swallowing.  No trauma.  He is wearing cervical collar.  Dr. Annette Stable operated on September 1.  Patient is a Tuesday Thursday Saturday dialysis patient was dialyzed yesterday.  The patient states that he was numb before surgery and was weak as well.  He says he was walking before the surgery and is still able to walk, but tires more rapidly.Even with an interpreter, getting a consistent history from the patient is difficult.  Past Medical History:  Diagnosis Date  . Abdominal pain 08/28/2016  . Acute hyperkalemia 10/28/2016  . Chest pain 04/11/2016  . Chronic combined systolic and diastolic CHF (congestive heart failure) ([place])   . Cirrhosis ([place])   . Dialysis patient ([place])   . ESRD (end stage renal disease) on dialysis ([place]) 01/25/2015  . Fluid overload 04/12/2016  . HTN (hypertension) 10/13/2016  . Hyperkalemia 11/27/2016  . Hypertension   . Mitral regurgitation   . Pulmonary edema 07/03/2016  . Renal disorder   . Renal insufficiency   . Scrotal infection 01/25/2015    Past Surgical History:  Procedure Laterality Date  . ANTERIOR CERVICAL CORPECTOMY N/A 04/05/2019   Procedure: ANTERIOR CERVICAL CORPECTOMY Cervical six and Cervical seven with strut graft fusion from Cervical five-Thoracic one   ;  Surgeon: Earnie Larsson, MD;  Location: [place];  Service: Neurosurgery;  Laterality: N/A;  . AV FISTULA PLACEMENT    .  POSTERIOR CERVICAL FUSION/FORAMINOTOMY N/A 04/05/2019   Procedure: Posterior cervical fusion utilizing segmental instrumentation from Cervical five-Thoracic one ;  Surgeon: Earnie Larsson, MD;  Location: [place];  Service: Neurosurgery;  Laterality: N/A;    Family History  Problem Relation Age of Onset  . Kidney failure Father   . Diabetes Mother     Social History:  reports that he has been smoking cigarettes. He has a 6.25 pack-year smoking history. He has never used smokeless tobacco. He reports that he does not drink alcohol or use drugs.  Allergies:  Allergies  Allergen Reactions  . Betadine [Povidone Iodine] Rash    Medications: I have reviewed the patient's current medications.  Results for orders placed or performed during the hospital encounter of 04/15/19 (from the past 48 hour(s))  Comprehensive metabolic panel     Status: Abnormal   Collection Time: 04/15/19  4:36 PM  Result Value Ref Range   Sodium 131 (L) 135 - 145 mmol/L   Potassium 5.4 (H) 3.5 - 5.1 mmol/L   Chloride 93 (L) 98 - 111 mmol/L   CO2 22 22 - 32 mmol/L   Glucose, Bld 91 70 - 99 mg/dL   BUN 77 (H) 6 - 20 mg/dL   Creatinine, Ser 7.67 (H) 0.61 - 1.24 mg/dL   Calcium 7.0 (L) 8.9 - 10.3 mg/dL   Total Protein 5.8 (L) 6.5 - 8.1 g/dL   Albumin 3.1 (L) 3.5 - 5.0 g/dL  AST 29 15 - 41 U/L   ALT 28 0 - 44 U/L   Alkaline Phosphatase 158 (H) 38 - 126 U/L   Total Bilirubin 1.3 (H) 0.3 - 1.2 mg/dL   GFR calc non Af Amer 8 (L) >60 mL/min   GFR calc Af Amer 9 (L) >60 mL/min   Anion gap 16 (H) 5 - 15    Comment: Performed at [facility] [address], [place], [place] [address]  CBC     Status: Abnormal   Collection Time: 04/15/19  4:36 PM  Result Value Ref Range   WBC 13.7 (H) 4.0 - 10.5 K/uL   RBC 3.85 (L) 4.22 - 5.81 MIL/uL   Hemoglobin 12.7 (L) 13.0 - 17.0 g/dL    Comment: REPEATED TO VERIFY   HCT 39.8 39.0 - 52.0 %   MCV 103.4 (H) 80.0 - 100.0 fL   MCH 33.0 26.0 - 34.0 pg   MCHC 31.9 30.0 - 36.0  g/dL   RDW 17.1 (H) 11.5 - 15.5 %   Platelets 126 (L) 150 - 400 K/uL    Comment: REPEATED TO VERIFY   nRBC 0.0 0.0 - 0.2 %    Comment: Performed at [facility], [place] [address], [place], [place] [address]    No results found.  Review of Systems - Negative except per HPI    Blood pressure 119/68, pulse 78, temperature 98 F (36.7 C), temperature source Oral, resp. rate 15, height 5\' 4"  (1.626 m), weight 69.8 kg, SpO2 97 %. Physical Exam  Constitutional: He is oriented to person, place, and time. He appears well-developed. He has a sickly appearance.  HENT:  Head: Normocephalic and atraumatic.  Eyes: Pupils are equal, round, and reactive to light. EOM are normal.  Neck:  Patient is wearing collar.  Incisions appear to be well-opposed without redness or drainage.  Trachea is midline.    Neurological: He is alert and oriented to person, place, and time. He has normal reflexes. No cranial nerve deficit. GCS eye subscore is 4. GCS verbal subscore is 5. GCS motor subscore is 6.  Patient notes greater weakness in left arm than right.  He has good bilateral upper and lower extremity strength (AG all groups) with some increased weakness in left grip, compared to right.  He appears to have good strength in both lower extremities.  No Hoffman's signs either hand.    Assessment/Plan: Patient came to ER with increasing neck pain and subjectively worse weakness and numbness.  He does not appear to be terribly weak.  His incisions appear to be healing well.  His WBC is elevated at 13K.  He is not on steroids.  He is due for dialysis tomorrow.  I have recommended admission to medicine service with MRI to assess cervical spine.  I will follow patient.  Peggyann Shoals, MD 04/15/2019, 10:55 PM

## 2019-04-15 NOTE — H&P (Signed)
History and Physical    Mario [name] KGM:[id number] DOB: [birth year] DOA: 04/15/2019  PCP: [name], MD Patient coming from: Home  Chief Complaint: Neck pain  HPI: Mario [name] is a 48 y.o. male with medical history significant of recent neck surgery, chronic combined systolic and diastolic congestive heart failure, liver cirrhosis, ESRD on HD, hypertension presenting to the hospital for evaluation of neck pain.  On 04/05/2019 patient underwent anterior cervical corpectomy, microdissection, and posterior cervical fusion for cervical spine erosive changes due to renal spondyloarthropathy.  Spanish interpreter services used.  Patient reports having a lot of pain in his neck for the past 1 month.  States he had a surgery done recently but continues to have a lot of pain.  He is also having tingling in both of his arms and hands.  Denies fevers.  States his home health nurse told him there was pus in the dressing.  He has been wearing a c-collar since his surgery.  Denies shortness of breath.  No dysphagia, he is requesting food to eat.  ED Course: Afebrile.  White count 13.7. ED provider discussed the case with Dr. Delice Lesch who recommended hospitalist admission for pain control and MRI to evaluate for complications from surgery.  He will see the patient.  Review of Systems:  All systems reviewed and apart from history of presenting illness, are negative.  Past Medical History:  Diagnosis Date   Abdominal pain 08/28/2016   Acute hyperkalemia 10/28/2016   Chest pain 04/11/2016   Chronic combined systolic and diastolic CHF (congestive heart failure) ([place])    Cirrhosis ([place])    Dialysis patient ([place])    ESRD (end stage renal disease) on dialysis ([place]) 01/25/2015   Fluid overload 04/12/2016   HTN (hypertension) 10/13/2016   Hyperkalemia 11/27/2016   Hypertension    Mitral regurgitation    Pulmonary edema 07/03/2016   Renal disorder    Renal insufficiency     Scrotal infection 01/25/2015    Past Surgical History:  Procedure Laterality Date   ANTERIOR CERVICAL CORPECTOMY N/A 04/05/2019   Procedure: ANTERIOR CERVICAL CORPECTOMY Cervical six and Cervical seven with strut graft fusion from Cervical five-Thoracic one   ;  Surgeon: Earnie Larsson, MD;  Location: [place];  Service: Neurosurgery;  Laterality: N/A;   AV FISTULA PLACEMENT     POSTERIOR CERVICAL FUSION/FORAMINOTOMY N/A 04/05/2019   Procedure: Posterior cervical fusion utilizing segmental instrumentation from Cervical five-Thoracic one ;  Surgeon: Earnie Larsson, MD;  Location: [facility];  Service: Neurosurgery;  Laterality: N/A;     reports that he has been smoking cigarettes. He has a 6.25 pack-year smoking history. He has never used smokeless tobacco. He reports that he does not drink alcohol or use drugs.  Allergies  Allergen Reactions   Betadine [Povidone Iodine] Rash    Family History  Problem Relation Age of Onset   Kidney failure Father    Diabetes Mother     Prior to Admission medications   Medication Sig Start Date End Date Taking? Authorizing Provider  acetaminophen (TYLENOL) 500 MG tablet Take 500 mg by mouth every 6 (six) hours as needed for mild pain.    [provider]  amLODipine (NORVASC) 10 MG tablet Take 1 tablet (10 mg total) by mouth daily. 04/14/19 05/14/19  Darliss Cheney, MD  cyclobenzaprine (FLEXERIL) 10 MG tablet Take 1 tablet (10 mg total) by mouth 3 (three) times daily. 04/13/19   Darliss Cheney, MD  ferric citrate (AURYXIA) 1 GM 210  MG(Fe) tablet Take 420 mg by mouth 3 (three) times daily with meals.     [provider]  lisinopril (ZESTRIL) 20 MG tablet Take 1 tablet (20 mg total) by mouth daily. 04/14/19 05/14/19  Darliss Cheney, MD  oxyCODONE (OXY IR/ROXICODONE) 5 MG immediate release tablet Take 1 tablet (5 mg total) by mouth every 6 (six) hours as needed for up to 15 doses for moderate pain. 04/13/19   Darliss Cheney, MD    Physical Exam: Vitals:    04/15/19 2145 04/15/19 2215 04/15/19 2300 04/15/19 2314  BP: 119/68 118/65 130/63 130/63  Pulse: 78 76 80 76  Resp:    18  Temp:      TempSrc:      SpO2: 97% 100% 99% 96%  Weight:      Height:        Physical Exam  Constitutional: He is oriented to person, place, and time. He appears well-developed and well-nourished. No distress.  HENT:  Head: Normocephalic.  Mouth/Throat: Oropharynx is clear and moist.  Eyes: Right eye exhibits no discharge. Left eye exhibits no discharge.  Neck:  C-collar in place Anterior surgical incision site with dressing in place with old dried blood.  No active bleeding. Posterior surgical incision site appears clean, dry, and intact. No pus drainage noted.  Cardiovascular: Normal rate, regular rhythm and intact distal pulses.  Pulmonary/Chest: Effort normal and breath sounds normal. No respiratory distress. He has no wheezes. He has no rales.  Abdominal: Soft. Bowel sounds are normal. He exhibits no distension. There is no abdominal tenderness. There is no guarding.  Musculoskeletal:        General: No edema.  Neurological: He is alert and oriented to person, place, and time.  Strength 5 out of 5 in bilateral upper and lower extremities except mild grip weakness of the left hand.  Skin: Skin is warm and dry. He is not diaphoretic.     Labs on Admission: I have personally reviewed following labs and imaging studies  CBC: Recent Labs  Lab 04/09/19 0527 04/11/19 0841 04/12/19 1349 04/15/19 1636  WBC 6.5 11.1* 9.1 13.7*  NEUTROABS 5.5 9.3*  --   --   HGB 10.8* 11.5* 14.7 12.7*  HCT 33.5* 35.2* 45.3 39.8  MCV 100.9* 101.1* 100.9* 103.4*  PLT 105* 161 113* 053*   Basic Metabolic Panel: Recent Labs  Lab 04/09/19 0527 04/11/19 0841 04/11/19 1109 04/12/19 1349 04/15/19 1636  NA 134* 138  --  132* 131*  K 5.5* 5.0  --  5.8* 5.4*  CL 92* 97*  --  95* 93*  CO2 23 24  --  18* 22  GLUCOSE 134* 134*  --  136* 91  BUN 71* 88*  --  120* 77*    CREATININE 7.92* 8.26*  --  10.01* 7.67*  CALCIUM 7.2* 7.3*  --  6.2* 7.0*  MG 2.6* 2.7*  --   --   --   PHOS  --   --  6.9* 6.7*  --    GFR: Estimated Creatinine Clearance: 9.9 mL/min (A) (by C-G formula based on SCr of 7.67 mg/dL (H)). Liver Function Tests: Recent Labs  Lab 04/09/19 0527 04/12/19 1349 04/15/19 1636  AST 25  --  29  ALT 8  --  28  ALKPHOS 156*  --  158*  BILITOT 1.0  --  1.3*  PROT 6.0*  --  5.8*  ALBUMIN 3.0* 3.0* 3.1*   No results for input(s): LIPASE, AMYLASE in the last  168 hours. No results for input(s): AMMONIA in the last 168 hours. Coagulation Profile: No results for input(s): INR, PROTIME in the last 168 hours. Cardiac Enzymes: No results for input(s): CKTOTAL, CKMB, CKMBINDEX, TROPONINI in the last 168 hours. BNP (last 3 results) No results for input(s): PROBNP in the last 8760 hours. HbA1C: No results for input(s): HGBA1C in the last 72 hours. CBG: Recent Labs  Lab 04/10/19 0212  GLUCAP 134*   Lipid Profile: No results for input(s): CHOL, HDL, LDLCALC, TRIG, CHOLHDL, LDLDIRECT in the last 72 hours. Thyroid Function Tests: No results for input(s): TSH, T4TOTAL, FREET4, T3FREE, THYROIDAB in the last 72 hours. Anemia Panel: No results for input(s): VITAMINB12, FOLATE, FERRITIN, TIBC, IRON, RETICCTPCT in the last 72 hours. Urine analysis: No results found for: COLORURINE, APPEARANCEUR, LABSPEC, PHURINE, GLUCOSEU, HGBUR, BILIRUBINUR, KETONESUR, PROTEINUR, UROBILINOGEN, NITRITE, LEUKOCYTESUR  Radiological Exams on Admission: No results found.  Assessment/Plan Principal Problem:   Neck pain Active Problems:   ESRD (end stage renal disease) on dialysis ([facility])   HTN (hypertension)   Tobacco abuse   Anemia   Neck pain in the setting of recent cervical spine surgery Afebrile.  White count 13.7, not on steroids.  Neurosurgery recommending pain control and MRI to evaluate for complications from surgery.  Patient reports tingling of both of  his arms and hands.  He has decreased grip strength of his left hand. -Morphine PRN, Tylenol PRN -STAT MRI C-spine without contrast.  Radiology unable to do contrast at this time as he is a dialysis patient and contrast can only be administered prior to dialysis session. -Neurosurgery following  Chronic thrombocytopenia Likely related to end-stage renal disease.  Platelet count 126,000.  No active bleeding. -Continue to monitor  Chronic anemia Suspect related to end-stage renal disease.  Hemoglobin 12.7, baseline in the 11-12 range.  Hemoglobin is 14.7 four days ago. -Continue to monitor  ESRD on HD TTSat Sodium 131. Potassium 5.4, was 5.8 three days ago.  Corrected calcium 7.7.  Bicarb 22.  No signs of volume overload. -Consult nephrology in a.m. for dialysis -Calcium gluconate -Renal function panel in a.m.  Tobacco use -Counseling -NicoDerm patch  Hypertension -Currently normotensive.  Consult nephrology in a.m. for dialysis.  Pharmacy med rec pending.  DVT prophylaxis: SCDs at this time, MRI pending Code Status: Full code Family Communication: No family available Disposition Plan: Anticipate discharge after clinical improvement. Consults called: Neurosurgery Admission status: It is my clinical opinion that referral for OBSERVATION is reasonable and necessary in this patient based on the above information provided. The aforementioned taken together are felt to place the patient at high risk for further clinical deterioration. However it is anticipated that the patient may be medically stable for discharge from the hospital within 24 to 48 hours.  The medical decision making on this patient was of high complexity and the patient is at high risk for clinical deterioration, therefore this is a level 3 visit.   Shela Leff MD [facility] Pager [phone]  If 7PM-7AM, please contact night-coverage [email or web address] Password [facility]  04/16/2019, 12:36 AM

## 2019-04-15 NOTE — ED Provider Notes (Signed)
Portal EMERGENCY DEPARTMENT Provider Note   CSN: [id number] Arrival date & time: 04/15/19  1605     History   Chief Complaint Chief Complaint  Patient presents with  . Neck Pain    HPI [facility] Dessie Coma is a 48 y.o. male.    Patient speaks Spanish and translator video was used. HPI Patient is 10 days post neck surgery.  Anterior and posterior surgery.  States pain is uncontrolled by home medicines.  Has tingling in both hands.  States may be some weakness.  States really unchanged since he left the hospital however.  No frank fever but has had chills.  States he called the nurse and was told to come into the hospital.  No headache.  He is on oxycodone every 6 hours that really does not help the pain.  No trouble swallowing.  No trauma.  He is wearing cervical collar.  Dr. Trenton Gammon operated on September 1.  Patient is a Tuesday Thursday Saturday dialysis patient was dialyzed yesterday. Past Medical History:  Diagnosis Date  . Abdominal pain 08/28/2016  . Acute hyperkalemia 10/28/2016  . Chest pain 04/11/2016  . Chronic combined systolic and diastolic CHF (congestive heart failure) ([place])   . Cirrhosis ([place])   . Dialysis patient ([place])   . ESRD (end stage renal disease) on dialysis ([place]) 01/25/2015  . Fluid overload 04/12/2016  . HTN (hypertension) 10/13/2016  . Hyperkalemia 11/27/2016  . Hypertension   . Mitral regurgitation   . Pulmonary edema 07/03/2016  . Renal disorder   . Renal insufficiency   . Scrotal infection 01/25/2015    Patient Active Problem List   Diagnosis Date Noted  . Cervical spinal stenosis 04/13/2019  . Cervical vertebral fracture ([place]) 04/13/2019  . Neck pain 04/03/2019  . Cervical radiculopathy 04/03/2019  . Hypertensive urgency 04/01/2019  . Intractable abdominal pain 05/01/2018  . Gastroenteritis 10/02/2017  . Abdominal pain, acute 08/21/2017  . Malnutrition of moderate degree 08/21/2017  . SBP (spontaneous bacterial  peritonitis) ([place]) 06/09/2017  . Erroneous encounter - disregard 03/04/2017  . Hyperkalemia 11/27/2016  . Acute hyperkalemia 10/28/2016  . Chronic combined systolic and diastolic CHF (congestive heart failure) ([place]) 10/13/2016  . Cirrhosis ([place]) 10/13/2016  . HTN (hypertension) 10/13/2016  . Abdominal pain 08/28/2016  . Pulmonary edema 07/03/2016  . Fluid overload 04/12/2016  . Chest pain 04/11/2016  . Scrotal infection 01/25/2015  . ESRD (end stage renal disease) on dialysis ([place]) 01/25/2015    Past Surgical History:  Procedure Laterality Date  . ANTERIOR CERVICAL CORPECTOMY N/A 04/05/2019   Procedure: ANTERIOR CERVICAL CORPECTOMY Cervical six and Cervical seven with strut graft fusion from Cervical five-Thoracic one   ;  Surgeon: Earnie Larsson, MD;  Location: [facility];  Service: Neurosurgery;  Laterality: N/A;  . AV FISTULA PLACEMENT    . POSTERIOR CERVICAL FUSION/FORAMINOTOMY N/A 04/05/2019   Procedure: Posterior cervical fusion utilizing segmental instrumentation from Cervical five-Thoracic one ;  Surgeon: Earnie Larsson, MD;  Location: [facility];  Service: Neurosurgery;  Laterality: N/A;        Home Medications    Prior to Admission medications   Medication Sig Start Date End Date Taking? Authorizing Provider  acetaminophen (TYLENOL) 500 MG tablet Take 500 mg by mouth every 6 (six) hours as needed for mild pain.    [provider]  amLODipine (NORVASC) 10 MG tablet Take 1 tablet (10 mg total) by mouth daily. 04/14/19 05/14/19  Darliss Cheney, MD  cyclobenzaprine (FLEXERIL) 10 MG tablet  Take 1 tablet (10 mg total) by mouth 3 (three) times daily. 04/13/19   Darliss Cheney, MD  ferric citrate (AURYXIA) 1 GM 210 MG(Fe) tablet Take 420 mg by mouth 3 (three) times daily with meals.     [provider]  lisinopril (ZESTRIL) 20 MG tablet Take 1 tablet (20 mg total) by mouth daily. 04/14/19 05/14/19  Darliss Cheney, MD  oxyCODONE (OXY IR/ROXICODONE) 5 MG immediate release tablet Take 1  tablet (5 mg total) by mouth every 6 (six) hours as needed for up to 15 doses for moderate pain. 04/13/19   Darliss Cheney, MD    Family History Family History  Problem Relation Age of Onset  . Kidney failure Father   . Diabetes Mother     Social History Social History   Tobacco Use  . Smoking status: Current Every Day Smoker    Packs/day: 0.25    Years: 25.00    Pack years: 6.25    Types: Cigarettes  . Smokeless tobacco: Never Used  Substance Use Topics  . Alcohol use: No    Comment: pt denies  . Drug use: No     Allergies   Betadine [povidone iodine]   Review of Systems Review of Systems  Constitutional: Positive for chills. Negative for fever.  HENT: Negative for congestion.   Respiratory: Negative for shortness of breath.   Cardiovascular: Negative for chest pain.  Gastrointestinal: Negative for abdominal pain.  Genitourinary: Negative for flank pain.  Musculoskeletal: Positive for neck pain.  Neurological: Positive for weakness and numbness.     Physical Exam Updated Vital Signs BP 119/68   Pulse 78   Temp 98 F (36.7 C) (Oral)   Resp 15   Ht 5\' 4"  (1.626 m)   Wt 69.8 kg   SpO2 97%   BMI 26.41 kg/m   Physical Exam Vitals signs and nursing note reviewed.  Eyes:     Extraocular Movements: Extraocular movements intact.     Pupils: Pupils are equal, round, and reactive to light.  Neck:     Comments: Cervical collar in place.  Dressing anterior and posterior.  Dressings clean dry intact.  No drainage seen from wounds.  Mild erythema on wounds. Cardiovascular:     Heart sounds: Normal heart sounds.  Pulmonary:     Breath sounds: Normal breath sounds.  Abdominal:     Tenderness: There is no abdominal tenderness.  Musculoskeletal:     Comments: Good grip strength bilateral hands.  Sensation grossly intact bilateral hands.  Face symmetric.  Skin:    General: Skin is warm.     Capillary Refill: Capillary refill takes less than 2 seconds.   Neurological:     Mental Status: He is alert.      ED Treatments / Results  Labs (all labs ordered are listed, but only abnormal results are displayed) Labs Reviewed  COMPREHENSIVE METABOLIC PANEL - Abnormal; Notable for the following components:      Result Value   Sodium 131 (*)    Potassium 5.4 (*)    Chloride 93 (*)    BUN 77 (*)    Creatinine, Ser 7.67 (*)    Calcium 7.0 (*)    Total Protein 5.8 (*)    Albumin 3.1 (*)    Alkaline Phosphatase 158 (*)    Total Bilirubin 1.3 (*)    GFR calc non Af Amer 8 (*)    GFR calc Af Amer 9 (*)    Anion gap 16 (*)  All other components within normal limits  CBC - Abnormal; Notable for the following components:   WBC 13.7 (*)    RBC 3.85 (*)    Hemoglobin 12.7 (*)    MCV 103.4 (*)    RDW 17.1 (*)    Platelets 126 (*)    All other components within normal limits    EKG None  Radiology No results found.  Procedures Procedures (including critical care time)  Medications Ordered in ED Medications  sodium chloride flush (NS) 0.9 % injection 3 mL (has no administration in time range)  HYDROmorphone (DILAUDID) injection 1 mg (has no administration in time range)     Initial Impression / Assessment and Plan / ED Course  I have reviewed the triage vital signs and the nursing notes.  Pertinent labs & imaging results that were available during my care of the patient were reviewed by me and considered in my medical decision making (see chart for details).        Patient with intractable pain post neck surgery.  Anterior and posterior approach had been done.  White count mildly elevated.  No fever here.  Discussed with Dr. Vertell Limber.  Feels the patient benefit from admission to hospital for pain control and will likely do dialysis in the morning.  Also will get MRI to evaluate for complications from surgery.  Dr. sternal states that these surgeries can be very painful on their own.  Final Clinical Impressions(s) / ED Diagnoses    Final diagnoses:  Postoperative pain after spinal surgery  End stage renal disease on dialysis [facility])    ED Discharge Orders    None       Davonna Belling, MD 04/15/19 2213

## 2019-04-15 NOTE — ED Notes (Signed)
ED TO INPATIENT HANDOFF REPORT  ED Nurse Name and Phone #: Levada Dy [phone] Name/Age/Gender Mario [name] 48 y.o. male Room/Bed: 038C/038C  Code Status   Code Status: Prior  Home/SNF/Other Home Patient oriented to: self, place, time and situation Is this baseline? Yes   Triage Complete: Triage complete  Chief Complaint drainage from surgical site  Triage Note The pt arrived by [facility] from home he had neck surgery last Tuesday.. he has insision drainage from his neck since the surgery no temp  He was told to come here by his surgeon  mujch pain last oxycocone was 1300 today every 6 hours   Allergies Allergies  Allergen Reactions  . Betadine [Povidone Iodine] Rash    Level of Care/Admitting Diagnosis ED Disposition    ED Disposition Condition [facility]: [place] [[id number]]  Level of Care: Med-Surg [16]  I expect the patient will be discharged within 24 hours: Yes  LOW acuity---Tx typically complete <24 hrs---ACUTE conditions typically can be evaluated <24 hours---LABS likely to return to acceptable levels <24 hours---IS near functional baseline---EXPECTED to return to current living arrangement---NOT newly hypoxic: Meets criteria for 5C-Observation unit  Covid Evaluation: Asymptomatic Screening Protocol (No Symptoms)  Diagnosis: Neck pain [158309]  Admitting Physician: Shela Leff [[id number]]  Attending Physician: Shela Leff [[id number]]  PT Class (Do Not Modify): Observation [104]  PT Acc Code (Do Not Modify): Observation [10022]       B Medical/Surgery History Past Medical History:  Diagnosis Date  . Abdominal pain 08/28/2016  . Acute hyperkalemia 10/28/2016  . Chest pain 04/11/2016  . Chronic combined systolic and diastolic CHF (congestive heart failure) ([place])   . Cirrhosis ([place])   . Dialysis patient ([place])   . ESRD (end stage renal disease) on dialysis ([place]) 01/25/2015  . Fluid overload 04/12/2016   . HTN (hypertension) 10/13/2016  . Hyperkalemia 11/27/2016  . Hypertension   . Mitral regurgitation   . Pulmonary edema 07/03/2016  . Renal disorder   . Renal insufficiency   . Scrotal infection 01/25/2015   Past Surgical History:  Procedure Laterality Date  . ANTERIOR CERVICAL CORPECTOMY N/A 04/05/2019   Procedure: ANTERIOR CERVICAL CORPECTOMY Cervical six and Cervical seven with strut graft fusion from Cervical five-Thoracic one   ;  Surgeon: Earnie Larsson, MD;  Location: [facility];  Service: Neurosurgery;  Laterality: N/A;  . AV FISTULA PLACEMENT    . POSTERIOR CERVICAL FUSION/FORAMINOTOMY N/A 04/05/2019   Procedure: Posterior cervical fusion utilizing segmental instrumentation from Cervical five-Thoracic one ;  Surgeon: Earnie Larsson, MD;  Location: [place];  Service: Neurosurgery;  Laterality: N/A;     A IV Location/Drains/Wounds Patient Lines/Drains/Airways Status   Active Line/Drains/Airways    Name:   Placement date:   Placement time:   Site:   Days:   Peripheral IV 04/15/19 Right Forearm   04/15/19    2226    Forearm   less than 1   Fistula / Graft Left Upper arm Arteriovenous fistula   -    -    Upper arm      Incision (Closed) 04/05/19 Neck   04/05/19    1339     10          Intake/Output Last 24 hours No intake or output data in the 24 hours ending 04/15/19 2328  Labs/Imaging Results for orders placed or performed during the hospital encounter of 04/15/19 (from the past 48 hour(s))  Comprehensive metabolic panel  Status: Abnormal   Collection Time: 04/15/19  4:36 PM  Result Value Ref Range   Sodium 131 (L) 135 - 145 mmol/L   Potassium 5.4 (H) 3.5 - 5.1 mmol/L   Chloride 93 (L) 98 - 111 mmol/L   CO2 22 22 - 32 mmol/L   Glucose, Bld 91 70 - 99 mg/dL   BUN 77 (H) 6 - 20 mg/dL   Creatinine, Ser 7.67 (H) 0.61 - 1.24 mg/dL   Calcium 7.0 (L) 8.9 - 10.3 mg/dL   Total Protein 5.8 (L) 6.5 - 8.1 g/dL   Albumin 3.1 (L) 3.5 - 5.0 g/dL   AST 29 15 - 41 U/L   ALT 28 0 - 44 U/L    Alkaline Phosphatase 158 (H) 38 - 126 U/L   Total Bilirubin 1.3 (H) 0.3 - 1.2 mg/dL   GFR calc non Af Amer 8 (L) >60 mL/min   GFR calc Af Amer 9 (L) >60 mL/min   Anion gap 16 (H) 5 - 15    Comment: Performed at [facility], [place] [address], [place], [place] [address]  CBC     Status: Abnormal   Collection Time: 04/15/19  4:36 PM  Result Value Ref Range   WBC 13.7 (H) 4.0 - 10.5 K/uL   RBC 3.85 (L) 4.22 - 5.81 MIL/uL   Hemoglobin 12.7 (L) 13.0 - 17.0 g/dL    Comment: REPEATED TO VERIFY   HCT 39.8 39.0 - 52.0 %   MCV 103.4 (H) 80.0 - 100.0 fL   MCH 33.0 26.0 - 34.0 pg   MCHC 31.9 30.0 - 36.0 g/dL   RDW 17.1 (H) 11.5 - 15.5 %   Platelets 126 (L) 150 - 400 K/uL    Comment: REPEATED TO VERIFY   nRBC 0.0 0.0 - 0.2 %    Comment: Performed at [facility], [place] [address] [place], [place] [address]   No results found.  Pending Labs [employer] (From admission, onward)    Start     Ordered   04/15/19 2242  SARS Coronavirus 2 [facility] order, Performed in [facility] hospital lab) Nasopharyngeal Nasopharyngeal Swab  (Symptomatic/High Risk of Exposure/Tier 1 Patients Labs with Precautions)  Once,   STAT    Question Answer Comment  Is this test for diagnosis or screening Screening   Symptomatic for COVID-19 as defined by CDC No   Hospitalized for COVID-19 No   Admitted to ICU for COVID-19 No   Previously tested for COVID-19 Yes   Resident in a congregate (group) care setting No   Employed in healthcare setting No      04/15/19 2241          Vitals/Pain Today's Vitals   04/15/19 2145 04/15/19 2215 04/15/19 2300 04/15/19 2314  BP: 119/68 118/65 130/63 130/63  Pulse: 78 76 80 76  Resp:    18  Temp:      TempSrc:      SpO2: 97% 100% 99% 96%  Weight:      Height:      PainSc:        Isolation Precautions No active isolations  Medications Medications  sodium chloride flush (NS) 0.9 % injection 3 mL (3 mLs Intravenous Given 04/15/19 2237)  HYDROmorphone  (DILAUDID) injection 1 mg (1 mg Intravenous Given 04/15/19 2250)    Mobility walks Low fall risk   Focused Assessments Skin   R Recommendations: See Admitting Provider Note  Report given to:   Additional Notes:

## 2019-04-16 ENCOUNTER — Observation Stay (HOSPITAL_COMMUNITY): Payer: Medicaid Other

## 2019-04-16 ENCOUNTER — Other Ambulatory Visit: Payer: Self-pay

## 2019-04-16 DIAGNOSIS — F17210 Nicotine dependence, cigarettes, uncomplicated: Secondary | ICD-10-CM | POA: Diagnosis present

## 2019-04-16 DIAGNOSIS — M542 Cervicalgia: Secondary | ICD-10-CM | POA: Diagnosis present

## 2019-04-16 DIAGNOSIS — Z981 Arthrodesis status: Secondary | ICD-10-CM | POA: Diagnosis not present

## 2019-04-16 DIAGNOSIS — Z720 Tobacco use: Secondary | ICD-10-CM

## 2019-04-16 DIAGNOSIS — Z888 Allergy status to other drugs, medicaments and biological substances status: Secondary | ICD-10-CM | POA: Diagnosis not present

## 2019-04-16 DIAGNOSIS — N2581 Secondary hyperparathyroidism of renal origin: Secondary | ICD-10-CM | POA: Diagnosis present

## 2019-04-16 DIAGNOSIS — D638 Anemia in other chronic diseases classified elsewhere: Secondary | ICD-10-CM | POA: Diagnosis present

## 2019-04-16 DIAGNOSIS — M62838 Other muscle spasm: Secondary | ICD-10-CM | POA: Diagnosis present

## 2019-04-16 DIAGNOSIS — N186 End stage renal disease: Secondary | ICD-10-CM | POA: Diagnosis present

## 2019-04-16 DIAGNOSIS — G8918 Other acute postprocedural pain: Secondary | ICD-10-CM | POA: Diagnosis present

## 2019-04-16 DIAGNOSIS — I132 Hypertensive heart and chronic kidney disease with heart failure and with stage 5 chronic kidney disease, or end stage renal disease: Secondary | ICD-10-CM | POA: Diagnosis present

## 2019-04-16 DIAGNOSIS — K7030 Alcoholic cirrhosis of liver without ascites: Secondary | ICD-10-CM | POA: Diagnosis present

## 2019-04-16 DIAGNOSIS — Z841 Family history of disorders of kidney and ureter: Secondary | ICD-10-CM | POA: Diagnosis not present

## 2019-04-16 DIAGNOSIS — D72829 Elevated white blood cell count, unspecified: Secondary | ICD-10-CM | POA: Diagnosis present

## 2019-04-16 DIAGNOSIS — I5042 Chronic combined systolic (congestive) and diastolic (congestive) heart failure: Secondary | ICD-10-CM | POA: Diagnosis present

## 2019-04-16 DIAGNOSIS — Z833 Family history of diabetes mellitus: Secondary | ICD-10-CM | POA: Diagnosis not present

## 2019-04-16 DIAGNOSIS — D631 Anemia in chronic kidney disease: Secondary | ICD-10-CM

## 2019-04-16 DIAGNOSIS — D6959 Other secondary thrombocytopenia: Secondary | ICD-10-CM | POA: Diagnosis present

## 2019-04-16 DIAGNOSIS — R51 Headache: Secondary | ICD-10-CM | POA: Diagnosis present

## 2019-04-16 DIAGNOSIS — Z992 Dependence on renal dialysis: Secondary | ICD-10-CM | POA: Diagnosis not present

## 2019-04-16 DIAGNOSIS — R6883 Chills (without fever): Secondary | ICD-10-CM | POA: Diagnosis present

## 2019-04-16 DIAGNOSIS — Z79899 Other long term (current) drug therapy: Secondary | ICD-10-CM | POA: Diagnosis not present

## 2019-04-16 DIAGNOSIS — N250 Renal osteodystrophy: Secondary | ICD-10-CM | POA: Diagnosis present

## 2019-04-16 DIAGNOSIS — Z20828 Contact with and (suspected) exposure to other viral communicable diseases: Secondary | ICD-10-CM | POA: Diagnosis present

## 2019-04-16 DIAGNOSIS — R531 Weakness: Secondary | ICD-10-CM | POA: Diagnosis present

## 2019-04-16 DIAGNOSIS — M4802 Spinal stenosis, cervical region: Secondary | ICD-10-CM | POA: Diagnosis present

## 2019-04-16 DIAGNOSIS — D649 Anemia, unspecified: Secondary | ICD-10-CM

## 2019-04-16 LAB — CBC
HCT: 35 % — ABNORMAL LOW (ref 39.0–52.0)
Hemoglobin: 11.5 g/dL — ABNORMAL LOW (ref 13.0–17.0)
MCH: 33.7 pg (ref 26.0–34.0)
MCHC: 32.9 g/dL (ref 30.0–36.0)
MCV: 102.6 fL — ABNORMAL HIGH (ref 80.0–100.0)
Platelets: 122 10*3/uL — ABNORMAL LOW (ref 150–400)
RBC: 3.41 MIL/uL — ABNORMAL LOW (ref 4.22–5.81)
RDW: 17.2 % — ABNORMAL HIGH (ref 11.5–15.5)
WBC: 12.8 10*3/uL — ABNORMAL HIGH (ref 4.0–10.5)
nRBC: 0 % (ref 0.0–0.2)

## 2019-04-16 LAB — RENAL FUNCTION PANEL
Albumin: 3 g/dL — ABNORMAL LOW (ref 3.5–5.0)
Anion gap: 19 — ABNORMAL HIGH (ref 5–15)
BUN: 83 mg/dL — ABNORMAL HIGH (ref 6–20)
CO2: 21 mmol/L — ABNORMAL LOW (ref 22–32)
Calcium: 7 mg/dL — ABNORMAL LOW (ref 8.9–10.3)
Chloride: 93 mmol/L — ABNORMAL LOW (ref 98–111)
Creatinine, Ser: 8.12 mg/dL — ABNORMAL HIGH (ref 0.61–1.24)
GFR calc Af Amer: 8 mL/min — ABNORMAL LOW (ref >60)
GFR calc non Af Amer: 7 mL/min — ABNORMAL LOW (ref >60)
Glucose, Bld: 66 mg/dL — ABNORMAL LOW (ref 70–99)
Phosphorus: 7.6 mg/dL — ABNORMAL HIGH (ref 2.5–4.6)
Potassium: 5.6 mmol/L — ABNORMAL HIGH (ref 3.5–5.1)
Sodium: 133 mmol/L — ABNORMAL LOW (ref 135–145)

## 2019-04-16 LAB — PROCALCITONIN: Procalcitonin: 1.81 ng/mL

## 2019-04-16 LAB — SARS CORONAVIRUS 2 BY RT PCR (HOSPITAL ORDER, PERFORMED IN ~~LOC~~ HOSPITAL LAB): SARS Coronavirus 2: NEGATIVE

## 2019-04-16 MED ORDER — CHLORHEXIDINE GLUCONATE CLOTH 2 % EX PADS
6.0000 | MEDICATED_PAD | Freq: Every day | CUTANEOUS | Status: DC
  Administered 2019-04-16 – 2019-04-21 (×5): 6 via TOPICAL

## 2019-04-16 MED ORDER — SODIUM CHLORIDE 0.9 % IV SOLN: 100.0000 mL | INTRAVENOUS | Status: DC | PRN

## 2019-04-16 MED ORDER — LIDOCAINE HCL (PF) 1 % IJ SOLN: 5.0000 mL | INTRAMUSCULAR | Status: DC | PRN

## 2019-04-16 MED ORDER — LIDOCAINE-PRILOCAINE 2.5-2.5 % EX CREA
1.0000 "application " | TOPICAL_CREAM | CUTANEOUS | Status: DC | PRN
  Filled 2019-04-16: qty 5

## 2019-04-16 MED ORDER — HYDROMORPHONE HCL 1 MG/ML IJ SOLN
1.0000 mg | Freq: Once | INTRAMUSCULAR | Status: AC
  Administered 2019-04-16: 1 mg via INTRAVENOUS
  Filled 2019-04-16: qty 1

## 2019-04-16 MED ORDER — ACETAMINOPHEN 650 MG RE SUPP: 650.0000 mg | Freq: Four times a day (QID) | RECTAL | Status: DC | PRN

## 2019-04-16 MED ORDER — MORPHINE SULFATE (PF) 2 MG/ML IV SOLN
1.0000 mg | INTRAVENOUS | Status: DC | PRN
  Administered 2019-04-16 – 2019-04-17 (×6): 1 mg via INTRAVENOUS
  Filled 2019-04-16 (×7): qty 1

## 2019-04-16 MED ORDER — ACETAMINOPHEN 325 MG PO TABS
650.0000 mg | ORAL_TABLET | Freq: Four times a day (QID) | ORAL | Status: DC | PRN
  Administered 2019-04-16 – 2019-04-21 (×10): 650 mg via ORAL
  Filled 2019-04-16 (×9): qty 2

## 2019-04-16 MED ORDER — NICOTINE 7 MG/24HR TD PT24
7.0000 mg | MEDICATED_PATCH | Freq: Every day | TRANSDERMAL | Status: DC
  Administered 2019-04-16 – 2019-04-21 (×6): 7 mg via TRANSDERMAL
  Filled 2019-04-16 (×8): qty 1

## 2019-04-16 MED ORDER — RENA-VITE PO TABS
1.0000 | ORAL_TABLET | Freq: Every day | ORAL | Status: DC
  Administered 2019-04-17 – 2019-04-20 (×4): 1 via ORAL
  Filled 2019-04-16 (×4): qty 1

## 2019-04-16 MED ORDER — OXYCODONE HCL 5 MG PO TABS
5.0000 mg | ORAL_TABLET | Freq: Four times a day (QID) | ORAL | Status: DC | PRN
  Administered 2019-04-16 – 2019-04-19 (×11): 5 mg via ORAL
  Filled 2019-04-16 (×12): qty 1

## 2019-04-16 MED ORDER — DOXERCALCIFEROL 4 MCG/2ML IV SOLN
1.0000 ug | INTRAVENOUS | Status: DC
  Administered 2019-04-19: 1 ug via INTRAVENOUS
  Filled 2019-04-16 (×2): qty 2

## 2019-04-16 MED ORDER — CALCIUM GLUCONATE-NACL 1-0.675 GM/50ML-% IV SOLN
1.0000 g | Freq: Once | INTRAVENOUS | Status: AC
  Administered 2019-04-16: 1000 mg via INTRAVENOUS
  Filled 2019-04-16: qty 50

## 2019-04-16 MED ORDER — CALCIUM ACETATE (PHOS BINDER) 667 MG PO CAPS
667.0000 mg | ORAL_CAPSULE | Freq: Three times a day (TID) | ORAL | Status: DC
  Administered 2019-04-16 – 2019-04-21 (×12): 667 mg via ORAL
  Filled 2019-04-16 (×17): qty 1

## 2019-04-16 MED ORDER — PENTAFLUOROPROP-TETRAFLUOROETH EX AERO: 1.0000 "application " | INHALATION_SPRAY | CUTANEOUS | Status: DC | PRN

## 2019-04-16 NOTE — Progress Notes (Addendum)
PROGRESS NOTE                                                                                                                                                                                                             Patient Demographics:    Mario [name], is a 48 y.o. male, DOB - May 20, 1971, ENI:[id number]  Outpatient Primary MD for the patient is [name], MD    LOS - 0  Admit date - 04/15/2019    Chief Complaint  Patient presents with   Neck Pain       Brief Narrative  Mario [name] is a 48 y.o. male with medical history significant of recent neck surgery, chronic combined systolic and diastolic congestive heart failure, liver cirrhosis, ESRD on HD, hypertension presenting to the hospital for evaluation of neck pain.  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  He now presents with worsening neck pain for the last few days and continues to have bilateral arm discomfort she describes as bilateral tingling and some numbness.  In the ER work-up suggestive of some fluid collection around the postop C-spine area, neurosurgery was consulted and hospitalist team was requested to admit.   Subjective:    Mario [name] today has, No headache, No chest pain, No abdominal pain - No Nausea, No new weakness tingling or numbness, No Cough - SOB.  Does have neck pain and bilateral arm discomfort which she says has been present for several months.   Assessment  & Plan :      1.  Neck pain, bilateral upper extremity tingling and numbness in a patient with recent anterior C-spine surgery.  MRI noted, findings nonspecific, patient afebrile but does have mild leukocytosis, neurosurgery is following, he is currently off of antibiotics  being monitored.  Will trend procalcitonin and WBC count.  If he turns febrile will initiate empiric broad-spectrum antibiotics.  Further changes per neurosurgery recommendation.  Continue c-collar for now.  Case discussed with Dr. Vertell Limber neurosurgeon on 04/16/2019.  Continue PRN IV morphine for intractable neck pain.  2.  ESRD.  On Tuesday, Thursday, Saturday schedule.  Nephrology on board.  3.  History of smoking.  Counseled to quit.  Has NicoDerm  patch.   4.  History of hypertension.  Placed on hydralazine as needed for now.  5.  Anemia of chronic disease and chronic thrombocytopenia.  Monitor.   Condition - Fair  Family Communication  :  None  Code Status :  Full  Diet :   Diet Order            Diet renal with fluid restriction Fluid restriction: 1200 mL Fluid; Room service appropriate? Yes; Fluid consistency: Thin  Diet effective now               Disposition Plan  :    Consults  : N.Surg, Nephrology  Procedures  :     MRI - 1. Severely motion degraded examination, also truncated due to patient discomfort. 2. Postsurgical edema and fluid in the posterior paraspinal soft tissues without specific evidence of infection. Prevertebral effusion is also likely postoperative. 3. Poor visualization of the spinal cord and spinal canal, particularly at the operative levels, but I suspect there is moderate-to-severe stenosis at the C5-6 level, where the CSF space is effaced.   PUD Prophylaxis :    DVT Prophylaxis  :  Lovenox - Heparin    Lab Results  Component Value Date   PLT 122 (L) 04/16/2019    Inpatient Medications  Scheduled Meds:  calcium acetate  667 mg Oral TID WC   Chlorhexidine Gluconate Cloth  6 each Topical Q0600   [START ON 04/19/2019] doxercalciferol  1 mcg Intravenous Q T,Th,Sa-HD   multivitamin  1 tablet Oral QHS   nicotine  7 mg Transdermal Daily   Continuous Infusions: PRN Meds:.acetaminophen **OR** acetaminophen, morphine injection,  oxyCODONE  Antibiotics  :    Anti-infectives (From admission, onward)   None       Time Spent in minutes  30   Lala Lund M.D on 04/16/2019 at 12:59 PM  To page go to [email or web address] - password [facility]  [facility] -  Office  [phone]    See all Orders from today for further details    Objective:   Vitals:   04/16/19 0046 04/16/19 0448 04/16/19 0758 04/16/19 0930  BP: (!) 144/70 (!) 125/51 136/63 (P) 122/65  Pulse: 74 80 83 (P) 82  Resp: 19 19 16  (P) 16  Temp: 98.3 F (36.8 C) 98 F (36.7 C) 98.3 F (36.8 C) (P) 98.4 F (36.9 C)  TempSrc: Oral Oral Oral (P) Oral  SpO2: 99% 100% 97%   Weight: 71.3 kg     Height: 5\' 4"  (1.626 m)       Wt Readings from Last 3 Encounters:  04/16/19 71.3 kg  04/12/19 69.8 kg  04/01/19 65.7 kg     Intake/Output Summary (Last 24 hours) at 04/16/2019 1259 Last data filed at 04/16/2019 1000 Gross per 24 hour  Intake 480 ml  Output 500 ml  Net -20 ml     Physical Exam  Awake Alert, Oriented X 3, No new F.N deficits, Normal affect Barnum.AT,PERRAL Neck in c-collar  Symmetrical Chest wall movement, Good air movement bilaterally, CTAB RRR,No Gallops,Rubs or new Murmurs, No Parasternal Heave +ve B.Sounds, Abd Soft, No tenderness, No organomegaly appriciated, No rebound - guarding or rigidity. No Cyanosis, Clubbing or edema,     Data Review:    CBC Recent Labs  Lab 04/11/19 0841 04/12/19 1349 04/15/19 1636 04/16/19 0420  WBC 11.1* 9.1 13.7* 12.8*  HGB 11.5* 14.7 12.7* 11.5*  HCT 35.2* 45.3 39.8 35.0*  PLT 161 113* 126* 122*  MCV 101.1*  100.9* 103.4* 102.6*  MCH 33.0 32.7 33.0 33.7  MCHC 32.7 32.5 31.9 32.9  RDW 15.5 16.4* 17.1* 17.2*  LYMPHSABS 0.4*  --   --   --   MONOABS 0.6  --   --   --   EOSABS 0.0  --   --   --   BASOSABS 0.0  --   --   --     Chemistries  Recent Labs  Lab 04/11/19 0841 04/12/19 1349 04/15/19 1636 04/16/19 0420  NA 138 132* 131* 133*  K 5.0 5.8* 5.4* 5.6*  CL 97* 95* 93*  93*  CO2 24 18* 22 21*  GLUCOSE 134* 136* 91 66*  BUN 88* 120* 77* 83*  CREATININE 8.26* 10.01* 7.67* 8.12*  CALCIUM 7.3* 6.2* 7.0* 7.0*  MG 2.7*  --   --   --   AST  --   --  29  --   ALT  --   --  28  --   ALKPHOS  --   --  158*  --   BILITOT  --   --  1.3*  --    ------------------------------------------------------------------------------------------------------------------ No results for input(s): CHOL, HDL, LDLCALC, TRIG, CHOLHDL, LDLDIRECT in the last 72 hours.  Lab Results  Component Value Date   HGBA1C 5.3 04/04/2019   ------------------------------------------------------------------------------------------------------------------ No results for input(s): TSH, T4TOTAL, T3FREE, THYROIDAB in the last 72 hours.  Invalid input(s): FREET3  Cardiac Enzymes No results for input(s): CKMB, TROPONINI, MYOGLOBIN in the last 168 hours.  Invalid input(s): CK ------------------------------------------------------------------------------------------------------------------    Component Value Date/Time   BNP 784.0 (H) 10/13/2016 1307    Micro Results Recent Results (from the past 240 hour(s))  SARS Coronavirus 2 [facility] order, Performed in [facility] hospital lab) Nasopharyngeal Nasopharyngeal Swab     Status: None   Collection Time: 04/15/19 11:19 PM   Specimen: Nasopharyngeal Swab  Result Value Ref Range Status   SARS Coronavirus 2 NEGATIVE NEGATIVE Final    Comment: (NOTE) If result is NEGATIVE SARS-CoV-2 target nucleic acids are NOT DETECTED. The SARS-CoV-2 RNA is generally detectable in upper and lower  respiratory specimens during the acute phase of infection. The lowest  concentration of SARS-CoV-2 viral copies this assay can detect is 250  copies / mL. A negative result does not preclude SARS-CoV-2 infection  and should not be used as the sole basis for treatment or other  patient management decisions.  A negative result may occur with  improper specimen  collection / handling, submission of specimen other  than nasopharyngeal swab, presence of viral mutation(s) within the  areas targeted by this assay, and inadequate number of viral copies  (<250 copies / mL). A negative result must be combined with clinical  observations, patient history, and epidemiological information. If result is POSITIVE SARS-CoV-2 target nucleic acids are DETECTED. The SARS-CoV-2 RNA is generally detectable in upper and lower  respiratory specimens dur ing the acute phase of infection.  Positive  results are indicative of active infection with SARS-CoV-2.  Clinical  correlation with patient history and other diagnostic information is  necessary to determine patient infection status.  Positive results do  not rule out bacterial infection or co-infection with other viruses. If result is PRESUMPTIVE POSTIVE SARS-CoV-2 nucleic acids MAY BE PRESENT.   A presumptive positive result was obtained on the submitted specimen  and confirmed on repeat testing.  While 2019 novel coronavirus  (SARS-CoV-2) nucleic acids may be present in the submitted sample  additional confirmatory testing  may be necessary for epidemiological  and / or clinical management purposes  to differentiate between  SARS-CoV-2 and other Sarbecovirus currently known to infect humans.  If clinically indicated additional testing with an alternate test  methodology [phone]) is advised. The SARS-CoV-2 RNA is generally  detectable in upper and lower respiratory sp ecimens during the acute  phase of infection. The expected result is Negative. Fact Sheet for Patients:  StrictlyIdeas.no Fact Sheet for Healthcare Providers: BankingDealers.co.za This test is not yet approved or cleared by the [place] FDA and has been authorized for detection and/or diagnosis of SARS-CoV-2 by FDA under an Emergency Use Authorization (EUA).  This EUA will remain in effect  (meaning this test can be used) for the duration of the COVID-19 declaration under Section 564(b)(1) of the Act, 21 U.S.C. section 360bbb-3(b)(1), unless the authorization is terminated or revoked sooner. Performed at [facility], [place] [address] [place], [place] [address]     Radiology Reports Dg Cervical Spine 2-3 Views  Result Date: 04/05/2019 CLINICAL DATA:  Cervical fusion EXAM: CERVICAL SPINE - 2-3 VIEW; DG C-ARM 1-60 MIN COMPARISON:  None. FLUOROSCOPY TIME:  Radiation Exposure Index (as provided by the fluoroscopic device): Not available If the device does not provide the exposure index: Fluoroscopy Time:  43 seconds Number of Acquired Images:  5 FINDINGS: Initial images demonstrate a surgical instrument anteriorly at what appears to be C6-7 at the area of vertebral collapse. Subsequent films demonstrate evidence of interbody fusion at C5-6 and C6-7 with anterior fixation. Subsequently posterior fixation elements are noted from C5-T1. IMPRESSION: Cervical fusion both anteriorly and posteriorly. Electronically Signed   By: Inez Catalina M.D.   On: 04/05/2019 14:53   Ct Cervical Spine Wo Contrast  Result Date: 04/01/2019 CLINICAL DATA:  Neck pain. Bilateral arm numbness. Dialysis patient EXAM: CT CERVICAL SPINE WITHOUT CONTRAST TECHNIQUE: Multidetector CT imaging of the cervical spine was performed without intravenous contrast. Multiplanar CT image reconstructions were also generated. COMPARISON:  CT cervical spine 03/23/2018 FINDINGS: Alignment: Mild retrolisthesis C3-4. Mild anterolisthesis C4-5. Marked kyphosis centered at C6-7 measuring 31 degrees. Kyphosis has progressed since the prior study due to progressive erosive changes of C6 and C7 vertebral bodies. Skull base and vertebrae: Erosive endplate changes at I5-0, C6-7, C7-T1 most likely due to spondyloarthropathy of dialysis. Progressive changes at C6-7 since the prior CT Soft tissues and spinal canal: Negative Disc levels:  C2-3:  Negative C3-4: Well corticated erosive changes in the endplates at Y7-7 similar to the prior study. No significant spinal stenosis C4-5: Negative C5-6: Negative C6-7: Erosive changes in the endplates bilaterally has progressed in the interval. There are erosive changes with loss of vertebral body volume of C6 and C7. Only a small amount of vertebral bodies present in the inferior endplate of C7. There is marked kyphosis at this level. Posterior spurring is present with mild spinal stenosis. Left facet subluxation with either degenerative or erosive changes in the facet. Possible instability. C7-T1: Erosive endplate changes similar to the prior study. 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 Marked progression of bony erosion at C6-7 with 31 degrees of kyphosis which has progressed in the interval. Subluxation of the left C6-7 facet which may be degenerative or due to erosive arthropathy. Surgical consultation is suggested. These results were called by telephone at the time of interpretation on 04/01/2019 at 3:13 pm to Dr. Quentin Cornwall ,  who verbally acknowledged these results. Electronically Signed   By: Franchot Gallo M.D.   On: 04/01/2019 15:14   Mr Cervical Spine Wo Contrast  Result Date: 04/16/2019 CLINICAL DATA:  Neck pain with bilateral upper extremity tingling. EXAM: MRI CERVICAL SPINE WITHOUT CONTRAST TECHNIQUE: Multiplanar, multisequence MR imaging of the cervical spine was performed. No intravenous contrast was administered. COMPARISON:  None. FINDINGS: The examination was discontinued prior to completion due to patient discomfort. Alignment: Reversal of normal cervical lordosis. No static subluxation. Vertebrae: C5-T1 posterior fusion with C5-7 anterior fusion. Cord: Visualization of the spinal cord is limited due to motion. There is unchanged mild hyperintense  T2-weighted signal at the C6-7 level. Posterior Fossa, vertebral arteries, paraspinal tissues: Visualized posterior fossa is normal. There is fluid and edema within the posterior paraspinal soft tissues, not unexpected in the setting of recent surgery. Disc levels: Level by level assessment is somewhat limited due to the degree of motion. C2-3: No spinal canal stenosis. C3-4: Small disc osteophyte complex with at least mild spinal canal stenosis. C4-5: No spinal canal stenosis. C5-6: The CSF space surrounding the spinal cord is effaced, but motion and susceptibility artifact from surgical hardware otherwise limited assessment. There is probably moderate-to-severe stenosis at this level. C6-7: CSF space is narrowed due to ligamentum flavum redundancy. Motion and susceptibility artifacts obscure further assessment. C7-T1: Spinal canal is patent. IMPRESSION: 1. Severely motion degraded examination, also truncated due to patient discomfort. 2. Postsurgical edema and fluid in the posterior paraspinal soft tissues without specific evidence of infection. Prevertebral effusion is also likely postoperative. 3. Poor visualization of the spinal cord and spinal canal, particularly at the operative levels, but I suspect there is moderate-to-severe stenosis at the C5-6 level, where the CSF space is effaced. Electronically Signed   By: Ulyses Jarred M.D.   On: 04/16/2019 03:56   Mr Cervical Spine Wo Contrast  Result Date: 04/03/2019 CLINICAL DATA:  Instead age renal disease on chronic hemodialysis. Progressive neck pain with upper extremity pain, tingling and weakness. Pathologic fracture. EXAM: MRI CERVICAL SPINE WITHOUT CONTRAST TECHNIQUE: Multiplanar, multisequence MR imaging of the cervical spine was performed. No intravenous contrast was administered. COMPARISON:  CT 04/01/2019 and 03/23/2018 FINDINGS: Alignment: Kyphotic curvature of the spine at C6-7. Vertebrae: Chronic endplate irregularity at C3-4. Chronic but  progressive collapse of the C6 and C7 vertebral bodies with kyphotic angulation and retropulsion. Chronic but progressive superior endplate deformity at T1. Cord: Cord indentation at C3-4 and C4-5 because of spinal stenosis with AP diameter of the canal 7-7.5 mm. Spinal stenosis at C6-7 with AP diameter of the canal only 6.3 mm with cord deformity. Abnormal T2 signal cord. Posterior Fossa, vertebral arteries, paraspinal tissues: Otherwise negative Disc levels: Foramen magnum is widely patent.  C1-2 and C2-3 are normal. C3-4: Chronic spondylosis/spondyloarthropathy. Bulging of the disc. Mild ligamentous hypertrophy. Spinal stenosis with AP diameter 7.3 mm. Slight indentation of cord. Bilateral foraminal stenosis. C4-5: Bulging of the disc. Ligamentous hypertrophy. Spinal stenosis with AP diameter of the canal 7.6 mm. Slight indentation of the cord. Mild foraminal narrowing right more than left. C5-6: Mild bulging of the disc. No compressive canal or foraminal stenosis. C6-7: Vertebral body collapse and kyphotic angulation as described above. Spinal stenosis with AP diameter of the canal only 6.3 mm. Cord deformity and increased T2 cord signal. Bilateral foraminal stenosis. C7-T1: Mild bulging of the disc. Superior endplate deformity at T1. Canal and foramina sufficiently patent. Upper thoracic region negative. IMPRESSION: Likely dialysis related spondyloarthropathy with vertebral body  collapse and kyphotic curvature at C6 and C7. Canal stenosis with AP diameter only 6.3 mm, effacing the subarachnoid space and deforming the cord without abnormal T2 signal within the cord. Severe bilateral foraminal stenosis at this level. Findings are progressive over time. Chronic spondyloarthropathy at C3-4 without progressive change since last year. Canal stenosis with AP diameter measuring 7.3 mm. Effacement of the subarachnoid space and slight indentation of the cord. Bilateral foraminal stenosis. C4-5 disc bulge. Canal stenosis  with AP diameter of 7.6 mm. Slight indentation of the cord. No foraminal stenosis. Progressive superior endplate deformity at T1 over time but without canal or foraminal stenosis. Electronically Signed   By: Nelson Chimes M.D.   On: 04/03/2019 13:27   Dg Chest Portable 1 View  Result Date: 04/01/2019 CLINICAL DATA:  Pain between the shoulder blades and intermittent bilateral arm numbness for 2 weeks. EXAM: PORTABLE CHEST 1 VIEW COMPARISON:  Single-view of the chest 05/01/2018. FINDINGS: There is cardiomegaly and mild interstitial edema. No consolidative process, pneumothorax or effusion. Atherosclerosis noted. No acute or focal bony abnormality. IMPRESSION: Cardiomegaly mild interstitial edema. Atherosclerosis. Electronically Signed   By: Inge Rise M.D.   On: 04/01/2019 11:15   Dg C-arm 1-60 Min  Result Date: 04/05/2019 CLINICAL DATA:  Cervical fusion EXAM: CERVICAL SPINE - 2-3 VIEW; DG C-ARM 1-60 MIN COMPARISON:  None. FLUOROSCOPY TIME:  Radiation Exposure Index (as provided by the fluoroscopic device): Not available If the device does not provide the exposure index: Fluoroscopy Time:  43 seconds Number of Acquired Images:  5 FINDINGS: Initial images demonstrate a surgical instrument anteriorly at what appears to be C6-7 at the area of vertebral collapse. Subsequent films demonstrate evidence of interbody fusion at C5-6 and C6-7 with anterior fixation. Subsequently posterior fixation elements are noted from C5-T1. IMPRESSION: Cervical fusion both anteriorly and posteriorly. Electronically Signed   By: Inez Catalina M.D.   On: 04/05/2019 14:53

## 2019-04-16 NOTE — Progress Notes (Signed)
Subjective: Patient reports less severe pain over night  Objective: Vital signs in last 24 hours: Temp:  [98 F (36.7 C)-98.3 F (36.8 C)] 98.3 F (36.8 C) (09/12 0758) Pulse Rate:  [74-84] 83 (09/12 0758) Resp:  [15-19] 16 (09/12 0758) BP: (118-144)/(51-70) 136/63 (09/12 0758) SpO2:  [96 %-100 %] 97 % (09/12 0758) Weight:  [69.8 kg-71.3 kg] 71.3 kg (09/12 0046)  Intake/Output from previous day: 09/11 0701 - 09/12 0700 In: 240 [P.O.:240] Out: -  Intake/Output this shift: No intake/output data recorded.  Physical Exam: Motor exam stable  Lab Results: Recent Labs    04/15/19 1636 04/16/19 0420  WBC 13.7* 12.8*  HGB 12.7* 11.5*  HCT 39.8 35.0*  PLT 126* 122*   BMET Recent Labs    04/15/19 1636 04/16/19 0420  NA 131* 133*  K 5.4* 5.6*  CL 93* 93*  CO2 22 21*  GLUCOSE 91 66*  BUN 77* 83*  CREATININE 7.67* 8.12*  CALCIUM 7.0* 7.0*    Studies/Results: Mr Cervical Spine Wo Contrast  Result Date: 04/16/2019 CLINICAL DATA:  Neck pain with bilateral upper extremity tingling. EXAM: MRI CERVICAL SPINE WITHOUT CONTRAST TECHNIQUE: Multiplanar, multisequence MR imaging of the cervical spine was performed. No intravenous contrast was administered. COMPARISON:  None. FINDINGS: The examination was discontinued prior to completion due to patient discomfort. Alignment: Reversal of normal cervical lordosis. No static subluxation. Vertebrae: C5-T1 posterior fusion with C5-7 anterior fusion. Cord: Visualization of the spinal cord is limited due to motion. There is unchanged mild hyperintense T2-weighted signal at the C6-7 level. Posterior Fossa, vertebral arteries, paraspinal tissues: Visualized posterior fossa is normal. There is fluid and edema within the posterior paraspinal soft tissues, not unexpected in the setting of recent surgery. Disc levels: Level by level assessment is somewhat limited due to the degree of motion. C2-3: No spinal canal stenosis. C3-4: Small disc osteophyte  complex with at least mild spinal canal stenosis. C4-5: No spinal canal stenosis. C5-6: The CSF space surrounding the spinal cord is effaced, but motion and susceptibility artifact from surgical hardware otherwise limited assessment. There is probably moderate-to-severe stenosis at this level. C6-7: CSF space is narrowed due to ligamentum flavum redundancy. Motion and susceptibility artifacts obscure further assessment. C7-T1: Spinal canal is patent. IMPRESSION: 1. Severely motion degraded examination, also truncated due to patient discomfort. 2. Postsurgical edema and fluid in the posterior paraspinal soft tissues without specific evidence of infection. Prevertebral effusion is also likely postoperative. 3. Poor visualization of the spinal cord and spinal canal, particularly at the operative levels, but I suspect there is moderate-to-severe stenosis at the C5-6 level, where the CSF space is effaced. Electronically Signed   By: Ulyses Jarred M.D.   On: 04/16/2019 03:56    Assessment/Plan: I have reviewed patient's MRI.  There is some suggestion of continued cord compression at [facility], but this is a motion degraded study.  There does not appear to be findings c/w infection.  Dr. Annette Stable will review situation with patient.  Continue supportive care.  Patient had dialysis this AM.    LOS: 0 days    Peggyann Shoals, MD 04/16/2019, 10:14 AM

## 2019-04-16 NOTE — Progress Notes (Signed)
[facility] Progress Note  Background:  Patient d/c 9/9 following admission for anterior cervical corpectomy and posterior cervical fusion due to renal osteodystrophy with myelopathy. Marland Kitchen He presented with post op pain. [place] RN told pt there was pus in the dressing. WBC in the ED 13.7   Dialysis Orders: [facility] / [employer] 3h  2/ 2.5Ca    800/450   65kg   Hep none  L AVF No Heparin hectorol 8mcg qHD  Assessment/Plan: 1. Neck pain s/p cervical spine surgery 9/1 for C6-7 fx with myelopathy/cord compression - NS following - suboptimal MRI - does not appear to have infection ? Cord compression at C5-6 2. ESRD -TTS - no heparin K 5.4 - 2 K bath today 3. Anemia - hgb 12.7 - no ESA needed 4. Secondary hyperparathyroidism - cotninue binders/hectorol 1 , Ca low 7 - correct Ca 7.7 - use 2.5 Ca bath today- P up add phoslo 1 ac tid - will help with Ca and P - outpatient history of binders not known -   5. HTN/volume - BP ok - UF to EDW 6. Nutrition - alb 3.1 - renal diet/fluid restriction 7. Alcoholic cirrhosis - hx prior paracentesis 8. Hx CHF 9. Thrombocytopenia - follow plts  Myriam Jacobson, PA-C [place] 04/16/2019,12:07 PM  LOS: 0 days   Subjective:  Pain in head/neck/shoulders. Ate very little lunch  Objective Vitals:   04/16/19 0046 04/16/19 0448 04/16/19 0758 04/16/19 0930  BP: (!) 144/70 (!) 125/51 136/63 (P) 122/65  Pulse: 74 80 83 (P) 82  Resp: 19 19 16  (P) 16  Temp: 98.3 F (36.8 C) 98 F (36.7 C) 98.3 F (36.8 C) (P) 98.4 F (36.9 C)  TempSrc: Oral Oral Oral (P) Oral  SpO2: 99% 100% 97%   Weight: 71.3 kg     Height: 5\' 4"  (1.626 m)      Physical Exam General: Miserable - moaning in pain chronically ill appearing Neck: Hard C-collar Heart: RRR Lungs: grossly clear anteriorly Abdomen: distended  Some ascites  Extremities:some UE wasting, bruising above armpits bilaterally - tr at best LE  edema Dialysis Access: left AVF aneurysmal + bruit   Additional Objective Labs: Basic Metabolic Panel: Recent Labs  Lab 04/11/19 1109 04/12/19 1349 04/15/19 1636 04/16/19 0420  NA  --  132* 131* 133*  K  --  5.8* 5.4* 5.6*  CL  --  95* 93* 93*  CO2  --  18* 22 21*  GLUCOSE  --  136* 91 66*  BUN  --  120* 77* 83*  CREATININE  --  10.01* 7.67* 8.12*  CALCIUM  --  6.2* 7.0* 7.0*  PHOS 6.9* 6.7*  --  7.6*   Liver Function Tests: Recent Labs  Lab 04/12/19 1349 04/15/19 1636 04/16/19 0420  AST  --  29  --   ALT  --  28  --   ALKPHOS  --  158*  --   BILITOT  --  1.3*  --   PROT  --  5.8*  --   ALBUMIN 3.0* 3.1* 3.0*   No results for input(s): LIPASE, AMYLASE in the last 168 hours. CBC: Recent Labs  Lab 04/11/19 0841 04/12/19 1349 04/15/19 1636 04/16/19 0420  WBC 11.1* 9.1 13.7* 12.8*  NEUTROABS 9.3*  --   --   --   HGB 11.5* 14.7 12.7* 11.5*  HCT 35.2* 45.3 39.8 35.0*  MCV 101.1* 100.9* 103.4* 102.6*  PLT 161 113* 126* 122*   Blood Culture    Component Value Date/Time   SDES  05/03/2018 1345    PLEURAL Performed at [facility], [address] [place], [place] [address]    [facility]  05/03/2018 1345    NONE Performed at [facility], [address] [place], [place] [address]    CULT  05/03/2018 1345    NO GROWTH 3 DAYS Performed at [facility], [place] [address], [place], [place] [address]    REPTSTATUS 05/07/2018 FINAL 05/03/2018 1345    Cardiac Enzymes: No results for input(s): CKTOTAL, CKMB, CKMBINDEX, TROPONINI in the last 168 hours. CBG: Recent Labs  Lab 04/10/19 0212  GLUCAP 134*   Iron Studies: No results for input(s): IRON, TIBC, TRANSFERRIN, FERRITIN in the last 72 hours. Lab Results  Component Value Date   INR 1.2 04/03/2019   INR 1.17 01/22/2018   INR 1.19 02/25/2017   Studies/Results: Mr Cervical Spine Wo Contrast  Result Date: 04/16/2019 CLINICAL DATA:  Neck pain with bilateral upper extremity  tingling. EXAM: MRI CERVICAL SPINE WITHOUT CONTRAST TECHNIQUE: Multiplanar, multisequence MR imaging of the cervical spine was performed. No intravenous contrast was administered. COMPARISON:  None. FINDINGS: The examination was discontinued prior to completion due to patient discomfort. Alignment: Reversal of normal cervical lordosis. No static subluxation. Vertebrae: C5-T1 posterior fusion with C5-7 anterior fusion. Cord: Visualization of the spinal cord is limited due to motion. There is unchanged mild hyperintense T2-weighted signal at the C6-7 level. Posterior Fossa, vertebral arteries, paraspinal tissues: Visualized posterior fossa is normal. There is fluid and edema within the posterior paraspinal soft tissues, not unexpected in the setting of recent surgery. Disc levels: Level by level assessment is somewhat limited due to the degree of motion. C2-3: No spinal canal stenosis. C3-4: Small disc osteophyte complex with at least mild spinal canal stenosis. C4-5: No spinal canal stenosis. C5-6: The CSF space surrounding the spinal cord is effaced, but motion and susceptibility artifact from surgical hardware otherwise limited assessment. There is probably moderate-to-severe stenosis at this level. C6-7: CSF space is narrowed due to ligamentum flavum redundancy. Motion and susceptibility artifacts obscure further assessment. C7-T1: Spinal canal is patent. IMPRESSION: 1. Severely motion degraded examination, also truncated due to patient discomfort. 2. Postsurgical edema and fluid in the posterior paraspinal soft tissues without specific evidence of infection. Prevertebral effusion is also likely postoperative. 3. Poor visualization of the spinal cord and spinal canal, particularly at the operative levels, but I suspect there is moderate-to-severe stenosis at the C5-6 level, where the CSF space is effaced. Electronically Signed   By: Ulyses Jarred M.D.   On: 04/16/2019 03:56   Medications:  . Chlorhexidine  Gluconate Cloth  6 each Topical Q0600  . nicotine  7 mg Transdermal Daily

## 2019-04-16 NOTE — Progress Notes (Signed)
Pt understands little English but assessment done with the use of [employer] interpreter.

## 2019-04-16 NOTE — Progress Notes (Signed)
Pt transported to hemodialysis via bed in stable condition with mask on.

## 2019-04-17 ENCOUNTER — Encounter (HOSPITAL_COMMUNITY): Payer: Self-pay | Admitting: *Deleted

## 2019-04-17 LAB — CBC
HCT: 34.1 % — ABNORMAL LOW (ref 39.0–52.0)
Hemoglobin: 11.2 g/dL — ABNORMAL LOW (ref 13.0–17.0)
MCH: 33.5 pg (ref 26.0–34.0)
MCHC: 32.8 g/dL (ref 30.0–36.0)
MCV: 102.1 fL — ABNORMAL HIGH (ref 80.0–100.0)
Platelets: 114 10*3/uL — ABNORMAL LOW (ref 150–400)
RBC: 3.34 MIL/uL — ABNORMAL LOW (ref 4.22–5.81)
RDW: 17.2 % — ABNORMAL HIGH (ref 11.5–15.5)
WBC: 11.2 10*3/uL — ABNORMAL HIGH (ref 4.0–10.5)
nRBC: 0 % (ref 0.0–0.2)

## 2019-04-17 LAB — PROCALCITONIN: Procalcitonin: 2.33 ng/mL

## 2019-04-17 MED ORDER — METHOCARBAMOL 500 MG PO TABS
500.0000 mg | ORAL_TABLET | Freq: Three times a day (TID) | ORAL | Status: DC | PRN
  Administered 2019-04-17 – 2019-04-20 (×7): 500 mg via ORAL
  Filled 2019-04-17 (×8): qty 1

## 2019-04-17 MED ORDER — MORPHINE SULFATE (PF) 2 MG/ML IV SOLN
2.0000 mg | INTRAVENOUS | Status: DC | PRN
  Administered 2019-04-17 (×4): 2 mg via INTRAVENOUS
  Filled 2019-04-17 (×3): qty 1

## 2019-04-17 MED ORDER — FENTANYL 50 MCG/HR TD PT72
1.0000 | MEDICATED_PATCH | TRANSDERMAL | Status: DC
  Administered 2019-04-17 – 2019-04-20 (×2): 1 via TRANSDERMAL
  Filled 2019-04-17 (×2): qty 1

## 2019-04-17 NOTE — Progress Notes (Signed)
PROGRESS NOTE                                                                                                                                                                                                             Patient Demographics:    Mario [name], is a 48 y.o. male, DOB - September 22, 1970, HKV:[id number]  Outpatient Primary MD for the patient is [name], MD    LOS - 1  Admit date - 04/15/2019    Chief Complaint  Patient presents with   Neck Pain       Brief Narrative  Mario [name] is a 48 y.o. male with medical history significant of recent neck surgery, chronic combined systolic and diastolic congestive heart failure, liver cirrhosis, ESRD on HD, hypertension presenting to the hospital for evaluation of neck pain.  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  He now presents with worsening neck pain for the last few days and continues to have bilateral arm discomfort she describes as bilateral tingling and some numbness.  In the ER work-up suggestive of some fluid collection around the postop C-spine area, neurosurgery was consulted and hospitalist team was requested to admit.   Subjective:   Patient in bed, appears comfortable, denies any headache, no fever, no chest pain or pressure, no shortness of breath , no abdominal pain. No focal weakness.  Continues to have neck pain and chronic bilateral upper extremity tingling and numbness, he is also complaining of some muscle spasms today.   Assessment  & Plan :      1.  Neck pain, bilateral upper extremity tingling and numbness in a patient with recent anterior C-spine surgery.  MRI noted, findings nonspecific, patient afebrile but does have mild leukocytosis,  neurosurgery is following, he is currently off of antibiotics being monitored.  Procalcitonin is 2.3 which is rather questionable to interpret in the setting of ESRD, currently afebrile, blood cultures have been ordered along with a baseline CRP level, case was discussed with Dr. Vertell Limber neurosurgeon on 04/16/2019.  For now continue supportive care with p.o. and IV pain medications, muscle relaxants added.  Dr. Annette Stable will reevaluate the patient on 04/18/2019.  2.  ESRD.  On Tuesday, Thursday,  Saturday schedule.  Nephrology on board, was dialyzed 04/17/2019 early in the morning.  3.  History of smoking.  Counseled to quit.  Has NicoDerm patch.   4.  History of hypertension.  Placed on hydralazine as needed for now.  5.  Anemia of chronic disease and chronic thrombocytopenia.  Monitor.   Condition - Fair  Family Communication  :  None  Code Status :  Full  Diet :   Diet Order            Diet renal with fluid restriction Fluid restriction: 1200 mL Fluid; Room service appropriate? Yes; Fluid consistency: Thin  Diet effective now               Disposition Plan  :    Consults  : N.Surg, Nephrology  Procedures  :     MRI - 1. Severely motion degraded examination, also truncated due to patient discomfort. 2. Postsurgical edema and fluid in the posterior paraspinal soft tissues without specific evidence of infection. Prevertebral effusion is also likely postoperative. 3. Poor visualization of the spinal cord and spinal canal, particularly at the operative levels, but I suspect there is moderate-to-severe stenosis at the C5-6 level, where the CSF space is effaced.   PUD Prophylaxis :    DVT Prophylaxis  :  Lovenox - Heparin    Lab Results  Component Value Date   PLT 114 (L) 04/17/2019    Inpatient Medications  Scheduled Meds:  calcium acetate  667 mg Oral TID WC   Chlorhexidine Gluconate Cloth  6 each Topical Q0600   [START ON 04/19/2019] doxercalciferol  1 mcg Intravenous Q  T,Th,Sa-HD   fentaNYL  1 patch Transdermal Q72H   multivitamin  1 tablet Oral QHS   nicotine  7 mg Transdermal Daily   Continuous Infusions: PRN Meds:.acetaminophen **OR** acetaminophen, methocarbamol, morphine injection, oxyCODONE  Antibiotics  :    Anti-infectives (From admission, onward)   None       Time Spent in minutes  30   Lala Lund M.D on 04/17/2019 at 10:26 AM  To page go to [email or web address] - password [facility]  [facility] -  Office  [phone]    See all Orders from today for further details    Objective:   Vitals:   04/17/19 0330 04/17/19 0349 04/17/19 0412 04/17/19 0741  BP: 116/60 130/60 (!) 147/71 131/61  Pulse: 87 85 93 79  Resp:  17 20 16   Temp:  98.9 F (37.2 C) 98.9 F (37.2 C) 98.3 F (36.8 C)  TempSrc:  Oral Oral Oral  SpO2:  97% 99% 99%  Weight:  67.6 kg    Height:        Wt Readings from Last 3 Encounters:  04/17/19 67.6 kg  04/12/19 69.8 kg  04/01/19 65.7 kg     Intake/Output Summary (Last 24 hours) at 04/17/2019 1026 Last data filed at 04/17/2019 0500 Gross per 24 hour  Intake 240 ml  Output 2501 ml  Net -2261 ml     Physical Exam Awake Alert,  No new F.N deficits, Normal affect [place].AT,PERRAL Neck in c-collar Symmetrical Chest wall movement, Good air movement bilaterally, CTAB RRR,No Gallops, Rubs or new Murmurs, No Parasternal Heave +ve B.Sounds, Abd Soft, No tenderness, No organomegaly appriciated, No rebound - guarding or rigidity. No Cyanosis, Clubbing or edema, No new Rash or bruise     Data Review:    CBC Recent Labs  Lab 04/11/19 0841 04/12/19 1349 04/15/19 1636 04/16/19 0420 04/17/19 6767  WBC 11.1* 9.1 13.7* 12.8* 11.2*  HGB 11.5* 14.7 12.7* 11.5* 11.2*  HCT 35.2* 45.3 39.8 35.0* 34.1*  PLT 161 113* 126* 122* 114*  MCV 101.1* 100.9* 103.4* 102.6* 102.1*  MCH 33.0 32.7 33.0 33.7 33.5  MCHC 32.7 32.5 31.9 32.9 32.8  RDW 15.5 16.4* 17.1* 17.2* 17.2*  LYMPHSABS 0.4*  --   --   --   --     MONOABS 0.6  --   --   --   --   EOSABS 0.0  --   --   --   --   BASOSABS 0.0  --   --   --   --     Chemistries  Recent Labs  Lab 04/11/19 0841 04/12/19 1349 04/15/19 1636 04/16/19 0420  NA 138 132* 131* 133*  K 5.0 5.8* 5.4* 5.6*  CL 97* 95* 93* 93*  CO2 24 18* 22 21*  GLUCOSE 134* 136* 91 66*  BUN 88* 120* 77* 83*  CREATININE 8.26* 10.01* 7.67* 8.12*  CALCIUM 7.3* 6.2* 7.0* 7.0*  MG 2.7*  --   --   --   AST  --   --  29  --   ALT  --   --  28  --   ALKPHOS  --   --  158*  --   BILITOT  --   --  1.3*  --    ------------------------------------------------------------------------------------------------------------------ No results for input(s): CHOL, HDL, LDLCALC, TRIG, CHOLHDL, LDLDIRECT in the last 72 hours.  Lab Results  Component Value Date   HGBA1C 5.3 04/04/2019   ------------------------------------------------------------------------------------------------------------------ No results for input(s): TSH, T4TOTAL, T3FREE, THYROIDAB in the last 72 hours.  Invalid input(s): FREET3  Cardiac Enzymes No results for input(s): CKMB, TROPONINI, MYOGLOBIN in the last 168 hours.  Invalid input(s): CK ------------------------------------------------------------------------------------------------------------------    Component Value Date/Time   BNP 784.0 (H) 10/13/2016 1307    Micro Results Recent Results (from the past 240 hour(s))  SARS Coronavirus 2 [facility] order, Performed in [facility] hospital lab) Nasopharyngeal Nasopharyngeal Swab     Status: None   Collection Time: 04/15/19 11:19 PM   Specimen: Nasopharyngeal Swab  Result Value Ref Range Status   SARS Coronavirus 2 NEGATIVE NEGATIVE Final    Comment: (NOTE) If result is NEGATIVE SARS-CoV-2 target nucleic acids are NOT DETECTED. The SARS-CoV-2 RNA is generally detectable in upper and lower  respiratory specimens during the acute phase of infection. The lowest  concentration of SARS-CoV-2 viral  copies this assay can detect is 250  copies / mL. A negative result does not preclude SARS-CoV-2 infection  and should not be used as the sole basis for treatment or other  patient management decisions.  A negative result may occur with  improper specimen collection / handling, submission of specimen other  than nasopharyngeal swab, presence of viral mutation(s) within the  areas targeted by this assay, and inadequate number of viral copies  (<250 copies / mL). A negative result must be combined with clinical  observations, patient history, and epidemiological information. If result is POSITIVE SARS-CoV-2 target nucleic acids are DETECTED. The SARS-CoV-2 RNA is generally detectable in upper and lower  respiratory specimens dur ing the acute phase of infection.  Positive  results are indicative of active infection with SARS-CoV-2.  Clinical  correlation with patient history and other diagnostic information is  necessary to determine patient infection status.  Positive results do  not rule out bacterial infection or co-infection with other viruses. If result  is PRESUMPTIVE POSTIVE SARS-CoV-2 nucleic acids MAY BE PRESENT.   A presumptive positive result was obtained on the submitted specimen  and confirmed on repeat testing.  While 2019 novel coronavirus  (SARS-CoV-2) nucleic acids may be present in the submitted sample  additional confirmatory testing may be necessary for epidemiological  and / or clinical management purposes  to differentiate between  SARS-CoV-2 and other Sarbecovirus currently known to infect humans.  If clinically indicated additional testing with an alternate test  methodology [phone]) is advised. The SARS-CoV-2 RNA is generally  detectable in upper and lower respiratory sp ecimens during the acute  phase of infection. The expected result is Negative. Fact Sheet for Patients:  StrictlyIdeas.no Fact Sheet for Healthcare  Providers: BankingDealers.co.za This test is not yet approved or cleared by the [place] FDA and has been authorized for detection and/or diagnosis of SARS-CoV-2 by FDA under an Emergency Use Authorization (EUA).  This EUA will remain in effect (meaning this test can be used) for the duration of the COVID-19 declaration under Section 564(b)(1) of the Act, 21 U.S.C. section 360bbb-3(b)(1), unless the authorization is terminated or revoked sooner. Performed at [facility], [address], [place], [place] [address]     Radiology Reports Dg Cervical Spine 2-3 Views  Result Date: 04/05/2019 CLINICAL DATA:  Cervical fusion EXAM: CERVICAL SPINE - 2-3 VIEW; DG C-ARM 1-60 MIN COMPARISON:  None. FLUOROSCOPY TIME:  Radiation Exposure Index (as provided by the fluoroscopic device): Not available If the device does not provide the exposure index: Fluoroscopy Time:  43 seconds Number of Acquired Images:  5 FINDINGS: Initial images demonstrate a surgical instrument anteriorly at what appears to be C6-7 at the area of vertebral collapse. Subsequent films demonstrate evidence of interbody fusion at C5-6 and C6-7 with anterior fixation. Subsequently posterior fixation elements are noted from C5-T1. IMPRESSION: Cervical fusion both anteriorly and posteriorly. Electronically Signed   By: Inez Catalina M.D.   On: 04/05/2019 14:53   Ct Cervical Spine Wo Contrast  Result Date: 04/01/2019 CLINICAL DATA:  Neck pain. Bilateral arm numbness. Dialysis patient EXAM: CT CERVICAL SPINE WITHOUT CONTRAST TECHNIQUE: Multidetector CT imaging of the cervical spine was performed without intravenous contrast. Multiplanar CT image reconstructions were also generated. COMPARISON:  CT cervical spine 03/23/2018 FINDINGS: Alignment: Mild retrolisthesis C3-4. Mild anterolisthesis C4-5. Marked kyphosis centered at C6-7 measuring 31 degrees. Kyphosis has progressed since the prior study due to progressive  erosive changes of C6 and C7 vertebral bodies. Skull base and vertebrae: Erosive endplate changes at I2-9, C6-7, C7-T1 most likely due to spondyloarthropathy of dialysis. Progressive changes at C6-7 since the prior CT Soft tissues and spinal canal: Negative Disc levels:  C2-3: Negative C3-4: Well corticated erosive changes in the endplates at N9-8 similar to the prior study. No significant spinal stenosis C4-5: Negative C5-6: Negative C6-7: Erosive changes in the endplates bilaterally has progressed in the interval. There are erosive changes with loss of vertebral body volume of C6 and C7. Only a small amount of vertebral bodies present in the inferior endplate of C7. There is marked kyphosis at this level. Posterior spurring is present with mild spinal stenosis. Left facet subluxation with either degenerative or erosive changes in the facet. Possible instability. C7-T1: Erosive endplate changes similar to the prior study. 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 Marked progression of bony erosion at C6-7 with 31 degrees  of kyphosis which has progressed in the interval. Subluxation of the left C6-7 facet which may be degenerative or due to erosive arthropathy. Surgical consultation is suggested. These results were called by telephone at the time of interpretation on 04/01/2019 at 3:13 pm to Dr. Quentin Cornwall , who verbally acknowledged these results. Electronically Signed   By: Franchot Gallo M.D.   On: 04/01/2019 15:14   Mr Cervical Spine Wo Contrast  Result Date: 04/16/2019 CLINICAL DATA:  Neck pain with bilateral upper extremity tingling. EXAM: MRI CERVICAL SPINE WITHOUT CONTRAST TECHNIQUE: Multiplanar, multisequence MR imaging of the cervical spine was performed. No intravenous contrast was administered. COMPARISON:  None. FINDINGS: The examination was discontinued prior  to completion due to patient discomfort. Alignment: Reversal of normal cervical lordosis. No static subluxation. Vertebrae: C5-T1 posterior fusion with C5-7 anterior fusion. Cord: Visualization of the spinal cord is limited due to motion. There is unchanged mild hyperintense T2-weighted signal at the C6-7 level. Posterior Fossa, vertebral arteries, paraspinal tissues: Visualized posterior fossa is normal. There is fluid and edema within the posterior paraspinal soft tissues, not unexpected in the setting of recent surgery. Disc levels: Level by level assessment is somewhat limited due to the degree of motion. C2-3: No spinal canal stenosis. C3-4: Small disc osteophyte complex with at least mild spinal canal stenosis. C4-5: No spinal canal stenosis. C5-6: The CSF space surrounding the spinal cord is effaced, but motion and susceptibility artifact from surgical hardware otherwise limited assessment. There is probably moderate-to-severe stenosis at this level. C6-7: CSF space is narrowed due to ligamentum flavum redundancy. Motion and susceptibility artifacts obscure further assessment. C7-T1: Spinal canal is patent. IMPRESSION: 1. Severely motion degraded examination, also truncated due to patient discomfort. 2. Postsurgical edema and fluid in the posterior paraspinal soft tissues without specific evidence of infection. Prevertebral effusion is also likely postoperative. 3. Poor visualization of the spinal cord and spinal canal, particularly at the operative levels, but I suspect there is moderate-to-severe stenosis at the C5-6 level, where the CSF space is effaced. Electronically Signed   By: Ulyses Jarred M.D.   On: 04/16/2019 03:56   Mr Cervical Spine Wo Contrast  Result Date: 04/03/2019 CLINICAL DATA:  Instead age renal disease on chronic hemodialysis. Progressive neck pain with upper extremity pain, tingling and weakness. Pathologic fracture. EXAM: MRI CERVICAL SPINE WITHOUT CONTRAST TECHNIQUE: Multiplanar,  multisequence MR imaging of the cervical spine was performed. No intravenous contrast was administered. COMPARISON:  CT 04/01/2019 and 03/23/2018 FINDINGS: Alignment: Kyphotic curvature of the spine at C6-7. Vertebrae: Chronic endplate irregularity at C3-4. Chronic but progressive collapse of the C6 and C7 vertebral bodies with kyphotic angulation and retropulsion. Chronic but progressive superior endplate deformity at T1. Cord: Cord indentation at C3-4 and C4-5 because of spinal stenosis with AP diameter of the canal 7-7.5 mm. Spinal stenosis at C6-7 with AP diameter of the canal only 6.3 mm with cord deformity. Abnormal T2 signal cord. Posterior Fossa, vertebral arteries, paraspinal tissues: Otherwise negative Disc levels: Foramen magnum is widely patent.  C1-2 and C2-3 are normal. C3-4: Chronic spondylosis/spondyloarthropathy. Bulging of the disc. Mild ligamentous hypertrophy. Spinal stenosis with AP diameter 7.3 mm. Slight indentation of cord. Bilateral foraminal stenosis. C4-5: Bulging of the disc. Ligamentous hypertrophy. Spinal stenosis with AP diameter of the canal 7.6 mm. Slight indentation of the cord. Mild foraminal narrowing right more than left. C5-6: Mild bulging of the disc. No compressive canal or foraminal stenosis. C6-7: Vertebral body collapse and kyphotic angulation as described above. Spinal stenosis  with AP diameter of the canal only 6.3 mm. Cord deformity and increased T2 cord signal. Bilateral foraminal stenosis. C7-T1: Mild bulging of the disc. Superior endplate deformity at T1. Canal and foramina sufficiently patent. Upper thoracic region negative. IMPRESSION: Likely dialysis related spondyloarthropathy with vertebral body collapse and kyphotic curvature at C6 and C7. Canal stenosis with AP diameter only 6.3 mm, effacing the subarachnoid space and deforming the cord without abnormal T2 signal within the cord. Severe bilateral foraminal stenosis at this level. Findings are progressive over  time. Chronic spondyloarthropathy at C3-4 without progressive change since last year. Canal stenosis with AP diameter measuring 7.3 mm. Effacement of the subarachnoid space and slight indentation of the cord. Bilateral foraminal stenosis. C4-5 disc bulge. Canal stenosis with AP diameter of 7.6 mm. Slight indentation of the cord. No foraminal stenosis. Progressive superior endplate deformity at T1 over time but without canal or foraminal stenosis. Electronically Signed   By: Nelson Chimes M.D.   On: 04/03/2019 13:27   Dg Chest Portable 1 View  Result Date: 04/01/2019 CLINICAL DATA:  Pain between the shoulder blades and intermittent bilateral arm numbness for 2 weeks. EXAM: PORTABLE CHEST 1 VIEW COMPARISON:  Single-view of the chest 05/01/2018. FINDINGS: There is cardiomegaly and mild interstitial edema. No consolidative process, pneumothorax or effusion. Atherosclerosis noted. No acute or focal bony abnormality. IMPRESSION: Cardiomegaly mild interstitial edema. Atherosclerosis. Electronically Signed   By: Inge Rise M.D.   On: 04/01/2019 11:15   Dg C-arm 1-60 Min  Result Date: 04/05/2019 CLINICAL DATA:  Cervical fusion EXAM: CERVICAL SPINE - 2-3 VIEW; DG C-ARM 1-60 MIN COMPARISON:  None. FLUOROSCOPY TIME:  Radiation Exposure Index (as provided by the fluoroscopic device): Not available If the device does not provide the exposure index: Fluoroscopy Time:  43 seconds Number of Acquired Images:  5 FINDINGS: Initial images demonstrate a surgical instrument anteriorly at what appears to be C6-7 at the area of vertebral collapse. Subsequent films demonstrate evidence of interbody fusion at C5-6 and C6-7 with anterior fixation. Subsequently posterior fixation elements are noted from C5-T1. IMPRESSION: Cervical fusion both anteriorly and posteriorly. Electronically Signed   By: Inez Catalina M.D.   On: 04/05/2019 14:53

## 2019-04-17 NOTE — Progress Notes (Signed)
Subjective: Patient reports continued severe pain  Objective: Vital signs in last 24 hours: Temp:  [98.2 F (36.8 C)-98.9 F (37.2 C)] 98.3 F (36.8 C) (09/13 0741) Pulse Rate:  [78-93] 79 (09/13 0741) Resp:  [16-20] 16 (09/13 0741) BP: (96-147)/(47-71) 131/61 (09/13 0741) SpO2:  [97 %-100 %] 99 % (09/13 0741) Weight:  [67.6 kg-69.6 kg] 67.6 kg (09/13 0349)  Intake/Output from previous day: 09/12 0701 - 09/13 0700 In: 600 [P.O.:600] Out: 3001 [Urine:1000; Stool:1] Intake/Output this shift: No intake/output data recorded.  Physical Exam: Stable motor exam.    Lab Results: Recent Labs    04/16/19 0420 04/17/19 0658  WBC 12.8* 11.2*  HGB 11.5* 11.2*  HCT 35.0* 34.1*  PLT 122* 114*   BMET Recent Labs    04/15/19 1636 04/16/19 0420  NA 131* 133*  K 5.4* 5.6*  CL 93* 93*  CO2 22 21*  GLUCOSE 91 66*  BUN 77* 83*  CREATININE 7.67* 8.12*  CALCIUM 7.0* 7.0*    Studies/Results: Mr Cervical Spine Wo Contrast  Result Date: 04/16/2019 CLINICAL DATA:  Neck pain with bilateral upper extremity tingling. EXAM: MRI CERVICAL SPINE WITHOUT CONTRAST TECHNIQUE: Multiplanar, multisequence MR imaging of the cervical spine was performed. No intravenous contrast was administered. COMPARISON:  None. FINDINGS: The examination was discontinued prior to completion due to patient discomfort. Alignment: Reversal of normal cervical lordosis. No static subluxation. Vertebrae: C5-T1 posterior fusion with C5-7 anterior fusion. Cord: Visualization of the spinal cord is limited due to motion. There is unchanged mild hyperintense T2-weighted signal at the C6-7 level. Posterior Fossa, vertebral arteries, paraspinal tissues: Visualized posterior fossa is normal. There is fluid and edema within the posterior paraspinal soft tissues, not unexpected in the setting of recent surgery. Disc levels: Level by level assessment is somewhat limited due to the degree of motion. C2-3: No spinal canal stenosis. C3-4:  Small disc osteophyte complex with at least mild spinal canal stenosis. C4-5: No spinal canal stenosis. C5-6: The CSF space surrounding the spinal cord is effaced, but motion and susceptibility artifact from surgical hardware otherwise limited assessment. There is probably moderate-to-severe stenosis at this level. C6-7: CSF space is narrowed due to ligamentum flavum redundancy. Motion and susceptibility artifacts obscure further assessment. C7-T1: Spinal canal is patent. IMPRESSION: 1. Severely motion degraded examination, also truncated due to patient discomfort. 2. Postsurgical edema and fluid in the posterior paraspinal soft tissues without specific evidence of infection. Prevertebral effusion is also likely postoperative. 3. Poor visualization of the spinal cord and spinal canal, particularly at the operative levels, but I suspect there is moderate-to-severe stenosis at the C5-6 level, where the CSF space is effaced. Electronically Signed   By: Ulyses Jarred M.D.   On: 04/16/2019 03:56    Assessment/Plan: Patient continues to complain of severe neck pain.  Dr.  Annette Stable to evaluate in AM.  Neurologically stable.  WBC down from yesterday.  No fever.    LOS: 1 day    Peggyann Shoals, MD 04/17/2019, 10:05 AM

## 2019-04-17 NOTE — Progress Notes (Addendum)
[facility] Progress Note  Background:  Patient d/c 9/9 following admission for anterior cervical corpectomy and posterior cervical fusion due to renal osteodystrophy with myelopathy. Marland Kitchen He presented with post op pain. [place] RN told pt there was pus in the dressing. WBC in the ED 13.7   Dialysis Orders: [facility]TS / [employer] 3h  2/ 2.5Ca    800/450   65kg   Hep none  L AVF No Heparin hectorol 69mcg qHD  Assessment/Plan: 1. Neck pain s/p cervical spine surgery 9/1 for C6-7 fx with myelopathy/cord compression - NS following - suboptimal MRI - does not appear to show infection ? Cord compression at C5-6 2. ESRD -TTS - no heparin next HD Tuesday 3. Anemia - hgb 12.7> 11.2  - no ESA needed 4. Secondary hyperparathyroidism - cotninue binders/hectorol 1 , Ca low 7 - correct Ca 7.7 - use 2.5 Ca bath today- P up add phoslo 1 ac tid - will help with Ca and P - outpatient history of binders not known -   5. HTN/volume - BP ok - ne UF 2 L 9/13 with post wt 67.6 - above EDW - BP dropped into 90s on HD  6. Nutrition - alb 3.1 - renal diet/fluid restriction 7. Alcoholic cirrhosis - hx prior paracentesis 8. Hx CHF 9. Thrombocytopenia - follow plts > 114K   Myriam Jacobson, PA-C [facility] Beeper [phone] 04/17/2019,8:41 AM  LOS: 1 day   Subjective:  Continues to have significant pain in neck/arms  Objective Vitals:   04/17/19 0330 04/17/19 0349 04/17/19 0412 04/17/19 0741  BP: 116/60 130/60 (!) 147/71 131/61  Pulse: 87 85 93 79  Resp:  17 20 16   Temp:  98.9 F (37.2 C) 98.9 F (37.2 C) 98.3 F (36.8 C)  TempSrc:  Oral Oral Oral  SpO2:  97% 99% 99%  Weight:  67.6 kg    Height:       Physical Exam General: Grimacing in pain chronically ill appearing Neck: Hard C-collar/neck brace Heart: RRR 2/6 murmur Lungs: grossly clear anteriorly Abdomen: distended w/ascites  Extremities:some UE wasting, bruising above armpits bilaterally - tr at  best LE edema Dialysis Access: left AVF aneurysmal + bruit   Additional Objective Labs: Basic Metabolic Panel: Recent Labs  Lab 04/11/19 1109 04/12/19 1349 04/15/19 1636 04/16/19 0420  NA  --  132* 131* 133*  K  --  5.8* 5.4* 5.6*  CL  --  95* 93* 93*  CO2  --  18* 22 21*  GLUCOSE  --  136* 91 66*  BUN  --  120* 77* 83*  CREATININE  --  10.01* 7.67* 8.12*  CALCIUM  --  6.2* 7.0* 7.0*  PHOS 6.9* 6.7*  --  7.6*   Liver Function Tests: Recent Labs  Lab 04/12/19 1349 04/15/19 1636 04/16/19 0420  AST  --  29  --   ALT  --  28  --   ALKPHOS  --  158*  --   BILITOT  --  1.3*  --   PROT  --  5.8*  --   ALBUMIN 3.0* 3.1* 3.0*   No results for input(s): LIPASE, AMYLASE in the last 168 hours. CBC: Recent Labs  Lab 04/11/19 0841 04/12/19 1349 04/15/19 1636 04/16/19 0420 04/17/19 0658  WBC 11.1* 9.1 13.7* 12.8* 11.2*  NEUTROABS 9.3*  --   --   --   --   HGB 11.5* 14.7 12.7* 11.5* 11.2*  HCT 35.2* 45.3 39.8 35.0* 34.1*  MCV 101.1* 100.9* 103.4* 102.6* 102.1*  PLT 161 113* 126* 122* 114*   Lab Results  Component Value Date   INR 1.2 04/03/2019   INR 1.17 01/22/2018   INR 1.19 02/25/2017   Studies/Results: Mr Cervical Spine Wo Contrast  Result Date: 04/16/2019 CLINICAL DATA:  Neck pain with bilateral upper extremity tingling. EXAM: MRI CERVICAL SPINE WITHOUT CONTRAST TECHNIQUE: Multiplanar, multisequence MR imaging of the cervical spine was performed. No intravenous contrast was administered. COMPARISON:  None. FINDINGS: The examination was discontinued prior to completion due to patient discomfort. Alignment: Reversal of normal cervical lordosis. No static subluxation. Vertebrae: C5-T1 posterior fusion with C5-7 anterior fusion. Cord: Visualization of the spinal cord is limited due to motion. There is unchanged mild hyperintense T2-weighted signal at the C6-7 level. Posterior Fossa, vertebral arteries, paraspinal tissues: Visualized posterior fossa is normal. There is  fluid and edema within the posterior paraspinal soft tissues, not unexpected in the setting of recent surgery. Disc levels: Level by level assessment is somewhat limited due to the degree of motion. C2-3: No spinal canal stenosis. C3-4: Small disc osteophyte complex with at least mild spinal canal stenosis. C4-5: No spinal canal stenosis. C5-6: The CSF space surrounding the spinal cord is effaced, but motion and susceptibility artifact from surgical hardware otherwise limited assessment. There is probably moderate-to-severe stenosis at this level. C6-7: CSF space is narrowed due to ligamentum flavum redundancy. Motion and susceptibility artifacts obscure further assessment. C7-T1: Spinal canal is patent. IMPRESSION: 1. Severely motion degraded examination, also truncated due to patient discomfort. 2. Postsurgical edema and fluid in the posterior paraspinal soft tissues without specific evidence of infection. Prevertebral effusion is also likely postoperative. 3. Poor visualization of the spinal cord and spinal canal, particularly at the operative levels, but I suspect there is moderate-to-severe stenosis at the C5-6 level, where the CSF space is effaced. Electronically Signed   By: Ulyses Jarred M.D.   On: 04/16/2019 03:56   Medications:  . calcium acetate  667 mg Oral TID WC  . Chlorhexidine Gluconate Cloth  6 each Topical Q0600  . [START ON 04/19/2019] doxercalciferol  1 mcg Intravenous Q T,Th,Sa-HD  . multivitamin  1 tablet Oral QHS  . nicotine  7 mg Transdermal Daily

## 2019-04-18 LAB — CBC
HCT: 31.3 % — ABNORMAL LOW (ref 39.0–52.0)
Hemoglobin: 10.2 g/dL — ABNORMAL LOW (ref 13.0–17.0)
MCH: 33.9 pg (ref 26.0–34.0)
MCHC: 32.6 g/dL (ref 30.0–36.0)
MCV: 104 fL — ABNORMAL HIGH (ref 80.0–100.0)
Platelets: 120 10*3/uL — ABNORMAL LOW (ref 150–400)
RBC: 3.01 MIL/uL — ABNORMAL LOW (ref 4.22–5.81)
RDW: 17.1 % — ABNORMAL HIGH (ref 11.5–15.5)
WBC: 10.9 10*3/uL — ABNORMAL HIGH (ref 4.0–10.5)
nRBC: 0 % (ref 0.0–0.2)

## 2019-04-18 LAB — C-REACTIVE PROTEIN: CRP: 16.7 mg/dL — ABNORMAL HIGH (ref <1.0)

## 2019-04-18 LAB — PROCALCITONIN: Procalcitonin: 2.07 ng/mL

## 2019-04-18 NOTE — Progress Notes (Signed)
04/18/19 1501  PT Visit Information  Last PT Received On 04/18/19  Assistance Needed +1 (+2 progressive mobility for chair follow)  PT/OT/SLP Co-Evaluation/Treatment Yes  Reason for Co-Treatment Complexity of the patient's impairments (multi-system involvement);For patient/therapist safety;Other (comment) (to provide interpreter services)  PT goals addressed during session Mobility/safety with mobility;Balance;Proper use of DME  History of Present Illness 48 yo male admitted secondary to worsening pain and weakness following recent cervical surgery. Pt is recently s/p cervical fusion C5-C7  C6-7 fx with severe spinal stenosis with cord compression pMH: alcoholic cirrhosis, ERSD, HTN, CHF  Precautions  Precautions Cervical  Precaution Booklet Issued Yes (comment)  Precaution Comments reviewed cervical precautions with pt using interpreter  Required Braces or Orthoses Cervical Brace  Cervical Brace Hard collar;At all times  Restrictions  Weight Bearing Restrictions No  Home Living  Family/patient expects to be discharged to: Private residence  Living Arrangements Alone  Available Help at Discharge Other (Comment) (reports no one to assist)  Type of [place] to enter  Entrance Stairs-Number of Steps 3  Entrance Stairs-Rails None  Home Layout One level  Bathroom Shower/Tub Tub/shower unit  Tax adviser - 2 wheels  Additional Comments Pt reports he has no family/friends to check on him, but from previous stay ~10 days ago, pt reports he lives with cousin/brother, but mostly lives alone? Pt's information for home set-up pt provided was very different from what was in chart from 10 days ago.  Prior Function  Level of Independence Independent with assistive device(s)  Comments Unable to recall falls, but did not report any friends/family to assist pt with transportation. From 10 days ago, pt reported a friend drives him to  dialysis.  Communication  Communication Prefers language other than English Public house manager Helene Kelp [phone]; spanish)  Pain Assessment  Pain Assessment Faces  Faces Pain Scale 4  Pain Location neck  Pain Descriptors / Indicators Discomfort  Pain Intervention(s) Monitored during session;Limited activity within patient's tolerance;Repositioned  Cognition  Arousal/Alertness Awake/alert  Behavior During Therapy Flat affect  Overall Cognitive Status Impaired/Different from baseline  Area of Impairment Memory  Memory Decreased short-term memory;Decreased recall of precautions  Safety/Judgement Decreased awareness of safety;Decreased awareness of deficits  Problem Solving Requires verbal cues;Requires tactile cues  General Comments Pt requiring cues for no bending. Pt's PLOF information was completely different regarding assist level and how he was doing at home now versus what was previously in chart from 10 days ago OT/PT Eval.  Upper Extremity Assessment  Upper Extremity Assessment Defer to OT evaluation (Reports numbness in BUE )  Lower Extremity Assessment  Lower Extremity Assessment Generalized weakness  Cervical / Trunk Assessment  Cervical / Trunk Assessment Other exceptions  Cervical / Trunk Exceptions s/p recent cervical surgery  Bed Mobility  Overal bed mobility Needs Assistance  Bed Mobility Sidelying to Sit  Rolling Supervision  Sidelying to sit Supervision  General bed mobility comments use of rail  Transfers  Overall transfer level Needs assistance  Equipment used Rolling walker (2 wheeled)  Transfers Sit to/from Stand  Sit to Stand Min guard;Min assist  General transfer comment preferring to pull up on walker. Required cues for safe hand placement. Min to min guard for steadying assist.   Ambulation/Gait  Ambulation/Gait assistance Min guard;Min assist  Gait Distance (Feet) 75 Feet  Assistive device Rolling walker (2 wheeled)  Gait Pattern/deviations Step-through  pattern;Decreased stride length;Narrow base of support  General Gait Details Guarded  gait with heavy reliance on RW. Requiring min to min guard for stability +chair follow  as pt with decreased activity tolerance.   Gait velocity decreased  Balance  Overall balance assessment Needs assistance  Sitting-balance support Feet supported;Bilateral upper extremity supported  Sitting balance-Leahy Scale Good  Standing balance support During functional activity;Bilateral upper extremity supported  Standing balance-Leahy Scale Poor  Standing balance comment posterior LOB when standing at sink. Reliant on BUE support during gait.   General Comments  General comments (skin integrity, edema, etc.) Pt with short term memory deficits reagrding precautions and PLOF info unknown if true or not. Pt tolerating session well, but functionally and safety wise, pt requires full supervisionA due to lack of safety awareness and language barrier involved.  PT - End of Session  Equipment Utilized During Treatment Cervical collar;Gait belt  Activity Tolerance Patient tolerated treatment well  Patient left in chair;with call bell/phone within reach;with chair alarm set  Nurse Communication Mobility status  PT Assessment  PT Recommendation/Assessment Patient needs continued PT services  PT Visit Diagnosis Unsteadiness on feet (R26.81);Other abnormalities of gait and mobility (R26.89);Pain;Difficulty in walking, not elsewhere classified (R26.2)  Pain - part of body  (neck)  PT Problem List Decreased strength;Decreased balance;Decreased mobility;Decreased knowledge of use of DME;Decreased knowledge of precautions;Decreased cognition;Decreased safety awareness;Decreased activity tolerance  Barriers to Discharge Decreased caregiver support  PT Plan  PT Frequency (ACUTE ONLY) Min 3X/week  PT Treatment/Interventions (ACUTE ONLY) DME instruction;Gait training;Stair training;Therapeutic activities;Therapeutic  exercise;Functional mobility training;Balance training;Patient/family education  AM-PAC PT "6 Clicks" Mobility Outcome Measure (Version 2)  Help needed turning from your back to your side while in a flat bed without using bedrails? 4  Help needed moving from lying on your back to sitting on the side of a flat bed without using bedrails? 3  Help needed moving to and from a bed to a chair (including a wheelchair)? 3  Help needed standing up from a chair using your arms (e.g., wheelchair or bedside chair)? 3  Help needed to walk in hospital room? 3  Help needed climbing 3-5 steps with a railing?  2  6 Click Score 18  Consider Recommendation of Discharge To: Home with [facility]  PT Recommendation  Follow Up Recommendations SNF;Supervision/Assistance - 24 hour  PT equipment None recommended by PT  Individuals Consulted  Consulted and Agree with Results and Recommendations Patient  Acute Rehab PT Goals  Patient Stated Goal to go to rehab  PT Goal Formulation With patient  Time For Goal Achievement 05/02/19  Potential to Achieve Goals Good  PT Time Calculation  PT Start Time (ACUTE ONLY) 1210  PT Stop Time (ACUTE ONLY) 1245  PT Time Calculation (min) (ACUTE ONLY) 35 min  PT General Charges  $$ ACUTE PT VISIT 1 Visit  PT Evaluation  $PT Eval Moderate Complexity 1 Mod  Written Expression  Dominant Hand Right   Pt admitted secondary to problem above with deficits above. Pt requiring min to min guard A for mobility using RW this session. Also requiring chair follow secondary to weakness. Pt with cognitive deficits and reporting differing PLOF and home information than during previous admission a few days ago. Pt reports he had no one to assist at home and has been struggling to care for himself at home. Feel pt would benefit from SNF at d/c to increase independence and safety given he has no one to assist at home. Will continue to follow acutely to maximize functional mobility independence and safety.  Leighton Ruff, PT, DPT  Acute Rehabilitation Services  Pager: [phone] Office: [phone]

## 2019-04-18 NOTE — Progress Notes (Signed)
Patient readmitted over the weekend secondary to poor pain control and possible infection.  Follow-up MRI scan demonstrates good appearance of his anterior decompression status post corpectomy set C6 and C7.  Cage in good position.  No evidence of anterior infection or significant residual anterior stenosis.  Patient with some posterior facet arthropathy and mild residual stenosis at the C5-6 level.  Overall this much improved from his preoperative state and I do not think that this mild stenosis requires any type of intervention.  His posterior surgery has a normal postoperative changes without evidence of deep wound space collection or obvious infection.  Currently his pain is well controlled.  He complains of just a little pain in the back of his neck.  He is having no pain in his anterior wound region.  He is having no radiating upper or lower extremity symptoms.  Overall I do not see any signs of active infection.  I believe that his critical spinal stenosis is well relieved following his extensive anterior posterior surgery.  I would recommend continue mobilization with therapy and possible discharge back to his skilled nursing facility.

## 2019-04-18 NOTE — Plan of Care (Signed)
  Problem: Education: Goal: Knowledge of General Education information will improve Description: Including pain rating scale, medication(s)/side effects and non-pharmacologic comfort measures Outcome: Progressing   Problem: Health Behavior/Discharge Planning: Goal: Ability to manage health-related needs will improve Outcome: Progressing   Problem: Clinical Measurements: Goal: Will remain free from infection Outcome: Progressing   Problem: Coping: Goal: Level of anxiety will decrease Outcome: Progressing   Problem: Pain Managment: Goal: General experience of comfort will improve Outcome: Progressing   Problem: Safety: Goal: Ability to remain free from injury will improve Outcome: Progressing   Problem: Skin Integrity: Goal: Risk for impaired skin integrity will decrease Outcome: Progressing   

## 2019-04-18 NOTE — Progress Notes (Signed)
PROGRESS NOTE                                                                                                                                                                                                             Patient Demographics:    Mario [name], is a 48 y.o. male, DOB - 1971-05-15, IRJ:[id number]  Outpatient Primary MD for the patient is [name], MD    LOS - 2  Admit date - 04/15/2019    Chief Complaint  Patient presents with   Neck Pain       Brief Narrative  Mario [name] is a 48 y.o. male with medical history significant of recent neck surgery, chronic combined systolic and diastolic congestive heart failure, liver cirrhosis, ESRD on HD, hypertension presenting to the hospital for evaluation of neck pain.  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  He now presents with worsening neck pain for the last few days and continues to have bilateral arm discomfort she describes as bilateral tingling and some numbness.  In the ER work-up suggestive of some fluid collection around the postop C-spine area, neurosurgery was consulted and hospitalist team was requested to admit.   Subjective:   Patient in bed, appears to be in less pain and discomfort, says his pain is much improved.  Spasms have improved to.   Assessment  & Plan :      1.  Neck pain, bilateral upper extremity tingling and numbness in a patient with recent anterior C-spine surgery.  MRI noted, findings nonspecific, patient afebrile but does have mild leukocytosis, neurosurgery is following, he is currently off of antibiotics being monitored.    Procalcitonin is 2.3 which is rather questionable to interpret in the setting of ESRD, currently afebrile,  blood cultures have been ordered which are thus far negative, CRP was elevated at around 16, has been placed on fentanyl patch with good effect and pain has finally improved, neurosurgery following, advance activity, PT OT and monitor.  2.  ESRD.  On Tuesday, Thursday, Saturday schedule.  Nephrology on board, was dialyzed 04/17/2019 early in the morning.  3.  History of smoking.  Counseled to quit.  Has NicoDerm patch.   4.  History of hypertension.  Placed on hydralazine as needed for now.  5.  Anemia of chronic disease and chronic thrombocytopenia.  Monitor.   Condition - Fair  Family Communication  :  None  Code Status :  Full  Diet :   Diet Order            Diet renal with fluid restriction Fluid restriction: 1200 mL Fluid; Room service appropriate? Yes; Fluid consistency: Thin  Diet effective now               Disposition Plan  :    Consults  : N.Surg, Nephrology  Procedures  :     MRI - 1. Severely motion degraded examination, also truncated due to patient discomfort. 2. Postsurgical edema and fluid in the posterior paraspinal soft tissues without specific evidence of infection. Prevertebral effusion is also likely postoperative. 3. Poor visualization of the spinal cord and spinal canal, particularly at the operative levels, but I suspect there is moderate-to-severe stenosis at the C5-6 level, where the CSF space is effaced.   PUD Prophylaxis :    DVT Prophylaxis  :  Lovenox - Heparin    Lab Results  Component Value Date   PLT 120 (L) 04/18/2019    Inpatient Medications  Scheduled Meds:  calcium acetate  667 mg Oral TID WC   Chlorhexidine Gluconate Cloth  6 each Topical Q0600   [START ON 04/19/2019] doxercalciferol  1 mcg Intravenous Q T,Th,Sa-HD   fentaNYL  1 patch Transdermal Q72H   multivitamin  1 tablet Oral QHS   nicotine  7 mg Transdermal Daily   Continuous Infusions: PRN Meds:.acetaminophen **OR** acetaminophen, methocarbamol, morphine  injection, oxyCODONE  Antibiotics  :    Anti-infectives (From admission, onward)   None       Time Spent in minutes  30   Lala Lund M.D on 04/18/2019 at 11:44 AM  To page go to [email or web address] - password [facility]  [facility] -  Office  [phone]    See all Orders from today for further details    Objective:   Vitals:   04/17/19 1614 04/17/19 2008 04/18/19 0312 04/18/19 0844  BP: (!) 142/71 137/66 130/72 134/66  Pulse: 88 85 77 76  Resp: 16 18 16 18   Temp: 98.4 F (36.9 C) 98.6 F (37 C) 98.1 F (36.7 C) 98 F (36.7 C)  TempSrc: Oral Oral Oral Oral  SpO2: 98% 98% 97% 100%  Weight:      Height:        Wt Readings from Last 3 Encounters:  04/17/19 67.6 kg  04/12/19 69.8 kg  04/01/19 65.7 kg     Intake/Output Summary (Last 24 hours) at 04/18/2019 1144 Last data filed at 04/18/2019 0700 Gross per 24 hour  Intake 840 ml  Output --  Net 840 ml     Physical Exam  Awake Alert, Oriented X 3, No new F.N deficits, Normal affect [place].AT,PERRAL Neck in c-collar Symmetrical Chest wall movement, Good air movement bilaterally, CTAB RRR,No Gallops, Rubs or new Murmurs, No Parasternal Heave +ve B.Sounds, Abd Soft, No tenderness, No organomegaly appriciated, No rebound - guarding or rigidity. No Cyanosis, Clubbing or edema, No new Rash or bruise      Data Review:    CBC Recent Labs  Lab 04/12/19 1349 04/15/19 1636 04/16/19 0420 04/17/19 0658 04/18/19 0324  WBC 9.1 13.7* 12.8* 11.2* 10.9*  HGB 14.7 12.7* 11.5* 11.2* 10.2*  HCT 45.3 39.8 35.0* 34.1* 31.3*  PLT 113* 126* 122*  114* 120*  MCV 100.9* 103.4* 102.6* 102.1* 104.0*  MCH 32.7 33.0 33.7 33.5 33.9  MCHC 32.5 31.9 32.9 32.8 32.6  RDW 16.4* 17.1* 17.2* 17.2* 17.1*    Chemistries  Recent Labs  Lab 04/12/19 1349 04/15/19 1636 04/16/19 0420  NA 132* 131* 133*  K 5.8* 5.4* 5.6*  CL 95* 93* 93*  CO2 18* 22 21*  GLUCOSE 136* 91 66*  BUN 120* 77* 83*  CREATININE 10.01* 7.67* 8.12*   CALCIUM 6.2* 7.0* 7.0*  AST  --  29  --   ALT  --  28  --   ALKPHOS  --  158*  --   BILITOT  --  1.3*  --    ------------------------------------------------------------------------------------------------------------------ No results for input(s): CHOL, HDL, LDLCALC, TRIG, CHOLHDL, LDLDIRECT in the last 72 hours.  Lab Results  Component Value Date   HGBA1C 5.3 04/04/2019   ------------------------------------------------------------------------------------------------------------------ No results for input(s): TSH, T4TOTAL, T3FREE, THYROIDAB in the last 72 hours.  Invalid input(s): FREET3  Cardiac Enzymes No results for input(s): CKMB, TROPONINI, MYOGLOBIN in the last 168 hours.  Invalid input(s): CK ------------------------------------------------------------------------------------------------------------------    Component Value Date/Time   BNP 784.0 (H) 10/13/2016 1307    Micro Results Recent Results (from the past 240 hour(s))  SARS Coronavirus 2 [facility] order, Performed in [facility] hospital lab) Nasopharyngeal Nasopharyngeal Swab     Status: None   Collection Time: 04/15/19 11:19 PM   Specimen: Nasopharyngeal Swab  Result Value Ref Range Status   SARS Coronavirus 2 NEGATIVE NEGATIVE Final    Comment: (NOTE) If result is NEGATIVE SARS-CoV-2 target nucleic acids are NOT DETECTED. The SARS-CoV-2 RNA is generally detectable in upper and lower  respiratory specimens during the acute phase of infection. The lowest  concentration of SARS-CoV-2 viral copies this assay can detect is 250  copies / mL. A negative result does not preclude SARS-CoV-2 infection  and should not be used as the sole basis for treatment or other  patient management decisions.  A negative result may occur with  improper specimen collection / handling, submission of specimen other  than nasopharyngeal swab, presence of viral mutation(s) within the  areas targeted by this assay, and inadequate  number of viral copies  (<250 copies / mL). A negative result must be combined with clinical  observations, patient history, and epidemiological information. If result is POSITIVE SARS-CoV-2 target nucleic acids are DETECTED. The SARS-CoV-2 RNA is generally detectable in upper and lower  respiratory specimens dur ing the acute phase of infection.  Positive  results are indicative of active infection with SARS-CoV-2.  Clinical  correlation with patient history and other diagnostic information is  necessary to determine patient infection status.  Positive results do  not rule out bacterial infection or co-infection with other viruses. If result is PRESUMPTIVE POSTIVE SARS-CoV-2 nucleic acids MAY BE PRESENT.   A presumptive positive result was obtained on the submitted specimen  and confirmed on repeat testing.  While 2019 novel coronavirus  (SARS-CoV-2) nucleic acids may be present in the submitted sample  additional confirmatory testing may be necessary for epidemiological  and / or clinical management purposes  to differentiate between  SARS-CoV-2 and other Sarbecovirus currently known to infect humans.  If clinically indicated additional testing with an alternate test  methodology [phone]) is advised. The SARS-CoV-2 RNA is generally  detectable in upper and lower respiratory sp ecimens during the acute  phase of infection. The expected result is Negative. Fact Sheet for Patients:  StrictlyIdeas.no  Fact Sheet for Healthcare Providers: BankingDealers.co.za This test is not yet approved or cleared by the [place] FDA and has been authorized for detection and/or diagnosis of SARS-CoV-2 by FDA under an Emergency Use Authorization (EUA).  This EUA will remain in effect (meaning this test can be used) for the duration of the COVID-19 declaration under Section 564(b)(1) of the Act, 21 U.S.C. section 360bbb-3(b)(1), unless the  authorization is terminated or revoked sooner. Performed at [facility], [place] [address], [place], [place] [address]     Radiology Reports Dg Cervical Spine 2-3 Views  Result Date: 04/05/2019 CLINICAL DATA:  Cervical fusion EXAM: CERVICAL SPINE - 2-3 VIEW; DG C-ARM 1-60 MIN COMPARISON:  None. FLUOROSCOPY TIME:  Radiation Exposure Index (as provided by the fluoroscopic device): Not available If the device does not provide the exposure index: Fluoroscopy Time:  43 seconds Number of Acquired Images:  5 FINDINGS: Initial images demonstrate a surgical instrument anteriorly at what appears to be C6-7 at the area of vertebral collapse. Subsequent films demonstrate evidence of interbody fusion at C5-6 and C6-7 with anterior fixation. Subsequently posterior fixation elements are noted from C5-T1. IMPRESSION: Cervical fusion both anteriorly and posteriorly. Electronically Signed   By: Inez Catalina M.D.   On: 04/05/2019 14:53   Ct Cervical Spine Wo Contrast  Result Date: 04/01/2019 CLINICAL DATA:  Neck pain. Bilateral arm numbness. Dialysis patient EXAM: CT CERVICAL SPINE WITHOUT CONTRAST TECHNIQUE: Multidetector CT imaging of the cervical spine was performed without intravenous contrast. Multiplanar CT image reconstructions were also generated. COMPARISON:  CT cervical spine 03/23/2018 FINDINGS: Alignment: Mild retrolisthesis C3-4. Mild anterolisthesis C4-5. Marked kyphosis centered at C6-7 measuring 31 degrees. Kyphosis has progressed since the prior study due to progressive erosive changes of C6 and C7 vertebral bodies. Skull base and vertebrae: Erosive endplate changes at O2-7, C6-7, C7-T1 most likely due to spondyloarthropathy of dialysis. Progressive changes at C6-7 since the prior CT Soft tissues and spinal canal: Negative Disc levels:  C2-3: Negative C3-4: Well corticated erosive changes in the endplates at X4-1 similar to the prior study. No significant spinal stenosis C4-5: Negative C5-6: Negative  C6-7: Erosive changes in the endplates bilaterally has progressed in the interval. There are erosive changes with loss of vertebral body volume of C6 and C7. Only a small amount of vertebral bodies present in the inferior endplate of C7. There is marked kyphosis at this level. Posterior spurring is present with mild spinal stenosis. Left facet subluxation with either degenerative or erosive changes in the facet. Possible instability. C7-T1: Erosive endplate changes similar to the prior study. 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 Marked progression of bony erosion at C6-7 with 31 degrees of kyphosis which has progressed in the interval. Subluxation of the left C6-7 facet which may be degenerative or due to erosive arthropathy. Surgical consultation is suggested. These results were called by telephone at the time of interpretation on 04/01/2019 at 3:13 pm to Dr. Quentin Cornwall , who verbally acknowledged these results. Electronically Signed   By: Franchot Gallo M.D.   On: 04/01/2019 15:14   Mr Cervical Spine Wo Contrast  Result Date: 04/16/2019 CLINICAL DATA:  Neck pain with bilateral upper extremity tingling. EXAM: MRI CERVICAL SPINE WITHOUT CONTRAST TECHNIQUE: Multiplanar, multisequence MR imaging of the cervical spine was performed. No intravenous contrast was administered. COMPARISON:  None. FINDINGS: The examination was discontinued prior to completion  due to patient discomfort. Alignment: Reversal of normal cervical lordosis. No static subluxation. Vertebrae: C5-T1 posterior fusion with C5-7 anterior fusion. Cord: Visualization of the spinal cord is limited due to motion. There is unchanged mild hyperintense T2-weighted signal at the C6-7 level. Posterior Fossa, vertebral arteries, paraspinal tissues: Visualized posterior fossa is normal. There is fluid and edema  within the posterior paraspinal soft tissues, not unexpected in the setting of recent surgery. Disc levels: Level by level assessment is somewhat limited due to the degree of motion. C2-3: No spinal canal stenosis. C3-4: Small disc osteophyte complex with at least mild spinal canal stenosis. C4-5: No spinal canal stenosis. C5-6: The CSF space surrounding the spinal cord is effaced, but motion and susceptibility artifact from surgical hardware otherwise limited assessment. There is probably moderate-to-severe stenosis at this level. C6-7: CSF space is narrowed due to ligamentum flavum redundancy. Motion and susceptibility artifacts obscure further assessment. C7-T1: Spinal canal is patent. IMPRESSION: 1. Severely motion degraded examination, also truncated due to patient discomfort. 2. Postsurgical edema and fluid in the posterior paraspinal soft tissues without specific evidence of infection. Prevertebral effusion is also likely postoperative. 3. Poor visualization of the spinal cord and spinal canal, particularly at the operative levels, but I suspect there is moderate-to-severe stenosis at the C5-6 level, where the CSF space is effaced. Electronically Signed   By: Ulyses Jarred M.D.   On: 04/16/2019 03:56   Mr Cervical Spine Wo Contrast  Result Date: 04/03/2019 CLINICAL DATA:  Instead age renal disease on chronic hemodialysis. Progressive neck pain with upper extremity pain, tingling and weakness. Pathologic fracture. EXAM: MRI CERVICAL SPINE WITHOUT CONTRAST TECHNIQUE: Multiplanar, multisequence MR imaging of the cervical spine was performed. No intravenous contrast was administered. COMPARISON:  CT 04/01/2019 and 03/23/2018 FINDINGS: Alignment: Kyphotic curvature of the spine at C6-7. Vertebrae: Chronic endplate irregularity at C3-4. Chronic but progressive collapse of the C6 and C7 vertebral bodies with kyphotic angulation and retropulsion. Chronic but progressive superior endplate deformity at T1. Cord:  Cord indentation at C3-4 and C4-5 because of spinal stenosis with AP diameter of the canal 7-7.5 mm. Spinal stenosis at C6-7 with AP diameter of the canal only 6.3 mm with cord deformity. Abnormal T2 signal cord. Posterior Fossa, vertebral arteries, paraspinal tissues: Otherwise negative Disc levels: Foramen magnum is widely patent.  C1-2 and C2-3 are normal. C3-4: Chronic spondylosis/spondyloarthropathy. Bulging of the disc. Mild ligamentous hypertrophy. Spinal stenosis with AP diameter 7.3 mm. Slight indentation of cord. Bilateral foraminal stenosis. C4-5: Bulging of the disc. Ligamentous hypertrophy. Spinal stenosis with AP diameter of the canal 7.6 mm. Slight indentation of the cord. Mild foraminal narrowing right more than left. C5-6: Mild bulging of the disc. No compressive canal or foraminal stenosis. C6-7: Vertebral body collapse and kyphotic angulation as described above. Spinal stenosis with AP diameter of the canal only 6.3 mm. Cord deformity and increased T2 cord signal. Bilateral foraminal stenosis. C7-T1: Mild bulging of the disc. Superior endplate deformity at T1. Canal and foramina sufficiently patent. Upper thoracic region negative. IMPRESSION: Likely dialysis related spondyloarthropathy with vertebral body collapse and kyphotic curvature at C6 and C7. Canal stenosis with AP diameter only 6.3 mm, effacing the subarachnoid space and deforming the cord without abnormal T2 signal within the cord. Severe bilateral foraminal stenosis at this level. Findings are progressive over time. Chronic spondyloarthropathy at C3-4 without progressive change since last year. Canal stenosis with AP diameter measuring 7.3 mm. Effacement of the subarachnoid space and slight indentation of the cord. Bilateral  foraminal stenosis. C4-5 disc bulge. Canal stenosis with AP diameter of 7.6 mm. Slight indentation of the cord. No foraminal stenosis. Progressive superior endplate deformity at T1 over time but without canal or  foraminal stenosis. Electronically Signed   By: Nelson Chimes M.D.   On: 04/03/2019 13:27   Dg Chest Portable 1 View  Result Date: 04/01/2019 CLINICAL DATA:  Pain between the shoulder blades and intermittent bilateral arm numbness for 2 weeks. EXAM: PORTABLE CHEST 1 VIEW COMPARISON:  Single-view of the chest 05/01/2018. FINDINGS: There is cardiomegaly and mild interstitial edema. No consolidative process, pneumothorax or effusion. Atherosclerosis noted. No acute or focal bony abnormality. IMPRESSION: Cardiomegaly mild interstitial edema. Atherosclerosis. Electronically Signed   By: Inge Rise M.D.   On: 04/01/2019 11:15   Dg C-arm 1-60 Min  Result Date: 04/05/2019 CLINICAL DATA:  Cervical fusion EXAM: CERVICAL SPINE - 2-3 VIEW; DG C-ARM 1-60 MIN COMPARISON:  None. FLUOROSCOPY TIME:  Radiation Exposure Index (as provided by the fluoroscopic device): Not available If the device does not provide the exposure index: Fluoroscopy Time:  43 seconds Number of Acquired Images:  5 FINDINGS: Initial images demonstrate a surgical instrument anteriorly at what appears to be C6-7 at the area of vertebral collapse. Subsequent films demonstrate evidence of interbody fusion at C5-6 and C6-7 with anterior fixation. Subsequently posterior fixation elements are noted from C5-T1. IMPRESSION: Cervical fusion both anteriorly and posteriorly. Electronically Signed   By: Inez Catalina M.D.   On: 04/05/2019 14:53

## 2019-04-18 NOTE — Evaluation (Signed)
Occupational Therapy Evaluation Patient Details Name: Mario [name] MRN: [id number] DOB: Apr 18, 1971 Today's Date: 04/18/2019    History of Present Illness 48 yo male s/p cervical fusion C5-C7  C6-7 fx with severe spinal stenosis with cord compression pMH: alcoholic cirrhosis, ERSD, HTN, CHF   Clinical Impression   Pt PTA:Pt reports he has no family/friends to check on him, but from previous stay ~10 days ago, pt reports he lives with cousin/brother, but mostly lives alone? Pt's information for home set-up was very different from what was in chart from 10 days ago. Pt reports using RW for mobility, but said nothing about home health or that a friend drives him to dialysis. Pt requires modA overall for ADL; pt using figure 4 technique for ADL and heavily reliant upon RW for stability. Pt minguardA to minA for stability with mobility and standing for functional tasks. Pt with short term memory deficits regarding precautions and PLOF info unknown if true or not. Pt tolerating session well, but functionally and safety wise, pt requires full supervisionA due to lack of safety awareness and language barrier involved.  Pt able to state 2/3 precautions. Pt greatly requires continued OT skilled services for ADL and mobility. OT following acutely.   Follow Up Recommendations  SNF;Supervision/Assistance - 24 hour(pt reports no one at home to assist)    Equipment Recommendations  None recommended by OT    Recommendations for Other Services       Precautions / Restrictions Precautions Precautions: Cervical Precaution Booklet Issued: Yes (comment) Precaution Comments: Alden Benjamin [phone] Required Braces or Orthoses: Cervical Brace Cervical Brace: Hard collar;At all times Restrictions Weight Bearing Restrictions: No      Mobility Bed Mobility Overal bed mobility: Needs Assistance Bed Mobility: Sidelying to Sit Rolling: Supervision Sidelying to sit: Supervision        General bed mobility comments: use of rail  Transfers Overall transfer level: Needs assistance Equipment used: Rolling walker (2 wheeled) Transfers: Sit to/from Stand Sit to Stand: Min guard;Min assist         General transfer comment: preferring to pull up on walker    Balance Overall balance assessment: Needs assistance Sitting-balance support: Feet supported;Bilateral upper extremity supported Sitting balance-Leahy Scale: Good     Standing balance support: During functional activity;Bilateral upper extremity supported Standing balance-Leahy Scale: Poor Standing balance comment: posterior LOB when standing at sink                           ADL either performed or assessed with clinical judgement   ADL Overall ADL's : Needs assistance/impaired Eating/Feeding: Modified independent Eating/Feeding Details (indicate cue type and reason): eating grapes in bed (spitting out skins) into cup Grooming: Oral care;Min guard;Standing Grooming Details (indicate cue type and reason): min guard for balance, cueing for using 2 cups to avoid bending and posture throughout task  Upper Body Bathing: Supervision/ safety   Lower Body Bathing: Minimal assistance;Sitting/lateral leans;Sit to/from stand Lower Body Bathing Details (indicate cue type and reason): figure 4 technique Upper Body Dressing : Minimal assistance;Cueing for safety Upper Body Dressing Details (indicate cue type and reason): Did not attempt to donn/doff C-collar  Lower Body Dressing: Minimal assistance Lower Body Dressing Details (indicate cue type and reason): Education for LB dressing  Toilet Transfer: Min guard;RW   Toileting- Clothing Manipulation and Hygiene: Minimal assistance;Sitting/lateral lean;Sit to/from stand       Functional mobility during ADLs: Min guard;Rolling walker General ADL Comments: Requiring  increased assist for UB/LB ADL; increased pain and poor mobility.     Vision Baseline  Vision/History: No visual deficits Vision Assessment?: No apparent visual deficits     Perception     Praxis      Pertinent Vitals/Pain Pain Assessment: Faces Faces Pain Scale: Hurts little more Pain Location: neck Pain Descriptors / Indicators: Discomfort Pain Intervention(s): Limited activity within patient's tolerance;Monitored during session     Hand Dominance Right   Extremity/Trunk Assessment Upper Extremity Assessment Upper Extremity Assessment: Overall WFL for tasks assessed   Lower Extremity Assessment Lower Extremity Assessment: Defer to PT evaluation;Generalized weakness   Cervical / Trunk Assessment Cervical / Trunk Assessment: Other exceptions Cervical / Trunk Exceptions: s/p surg   Communication Communication Communication: Prefers language other than English(spanish)   Cognition Arousal/Alertness: Awake/alert Behavior During Therapy: Flat affect Overall Cognitive Status: Impaired/Different from baseline Area of Impairment: Memory                     Memory: Decreased short-term memory;Decreased recall of precautions   Safety/Judgement: Decreased awareness of safety;Decreased awareness of deficits   Problem Solving: Requires verbal cues;Requires tactile cues General Comments: Pt requiring cues for no bending. Pt's PLOF information was completely different regarding assist level and how he was doing at home now versus what was previously in chart from 10 days ago OT Eval.   General Comments  Pt with short term memory deficits reagrding precautions and PLOF info unknown if true or not. Pt tolerating session well, but functionally and safety wise, pt requires full supervisionA due to lack of safety awareness and language barrier involved.    Exercises     Shoulder Instructions      Home Living Family/patient expects to be discharged to:: Private residence Living Arrangements: Alone Available Help at Discharge: Other (Comment)(reports no one to  assist) Type of Home: Mobile home Home Access: Stairs to enter Entrance Stairs-Number of Steps: 3 Entrance Stairs-Rails: None Home Layout: One level     Bathroom Shower/Tub: Teacher, early years/pre: Standard     Home Equipment: Environmental consultant - 2 wheels   Additional Comments: Pt reports he has no family/friends to check on him, but from previous stay ~10 days ago, pt reports he lives with cousin/brother, but mostly lives alone? Pt's information for home set-up pt provided was very different from what was in chart from 10 days ago.      Prior Functioning/Environment Level of Independence: Independent with assistive device(s)        Comments: Unable to recall falls, but did not report any friends/family to assist pt with transportation. From 10 days ago, pt reported a friend drives him to dialysis.        OT Problem List: Decreased activity tolerance;Decreased coordination;Decreased knowledge of precautions;Decreased knowledge of use of DME or AE;Decreased safety awareness;Decreased strength;Impaired balance (sitting and/or standing);Pain;Impaired UE functional use;Decreased cognition      OT Treatment/Interventions: Self-care/ADL training;Therapeutic exercise;Therapeutic activities;Neuromuscular education;DME and/or AE instruction;Manual therapy;Modalities;Cognitive remediation/compensation;Patient/family education;Balance training    OT Goals(Current goals can be found in the care plan section) Acute Rehab OT Goals Patient Stated Goal: to go to rehab OT Goal Formulation: With patient Time For Goal Achievement: 05/02/19 Potential to Achieve Goals: Good ADL Goals Pt Will Perform Upper Body Dressing: (P) with set-up;sitting Pt Will Perform Lower Body Dressing: (P) with modified independence;sitting/lateral leans;sit to/from stand Pt Will Transfer to Toilet: (P) with modified independence Pt Will Perform Toileting - Clothing Manipulation and hygiene: (P)  with modified  independence;sitting/lateral leans;sit to/from stand Pt/caregiver will Perform Home Exercise Program: (P) Both right and left upper extremity;With written HEP provided Additional ADL Goal #1: (P) Pt will be able to perform ADL tasks with minimal safety cues for proper techniques abiding by precautions.  OT Frequency: Min 2X/week   Barriers to D/C: Decreased caregiver support  reports no one at home to assist       Co-evaluation PT/OT/SLP Co-Evaluation/Treatment: Yes Reason for Co-Treatment: Complexity of the patient's impairments (multi-system involvement);For patient/therapist safety   OT goals addressed during session: ADL's and self-care      AM-PAC OT "6 Clicks" Daily Activity     Outcome Measure Help from another person eating meals?: None Help from another person taking care of personal grooming?: A Little Help from another person toileting, which includes using toliet, bedpan, or urinal?: A Little Help from another person bathing (including washing, rinsing, drying)?: A Little Help from another person to put on and taking off regular upper body clothing?: A Little Help from another person to put on and taking off regular lower body clothing?: A Little 6 Click Score: 19   End of Session Equipment Utilized During Treatment: Rolling walker;Cervical collar;Gait belt Nurse Communication: Mobility status;Precautions  Activity Tolerance: Patient tolerated treatment well Patient left: in chair;with call bell/phone within reach;with chair alarm set  OT Visit Diagnosis: Unsteadiness on feet (R26.81);Muscle weakness (generalized) (M62.81)                Time: 0932-6712 OT Time Calculation (min): 35 min Charges:  OT General Charges $OT Visit: 1 Visit OT Evaluation $OT Eval Moderate Complexity: 1 Mod  Darryl Nestle) Marsa Aris OTR/L Acute Rehabilitation Services Pager: [phone] Office: [phone]   Audie Pinto 04/18/2019, 1:44 PM

## 2019-04-18 NOTE — Progress Notes (Signed)
[facility]    NEPHROLOGY PROGRESS NOTE  SUBJECTIVE: Patient seen and examined early this morning.  Denies any chest pain, shortness of breath, nausea, vomiting, or dysuria.  For dialysis tomorrow.     OBJECTIVE:  Vitals:   04/18/19 0312 04/18/19 0844  BP: 130/72 134/66  Pulse: 77 76  Resp: 16 18  Temp: 98.1 F (36.7 C) 98 F (36.7 C)  SpO2: 97% 100%    Intake/Output Summary (Last 24 hours) at 04/18/2019 1533 Last data filed at 04/18/2019 0700 Gross per 24 hour  Intake 600 ml  Output -  Net 600 ml      General:  AAOx3 NAD HEENT: MMM Bertha AT anicteric sclera Neck:  No JVD, no adenopathy CV:  Heart RRR  Lungs:  L/S CTA bilaterally Abd:  abd SNT/ND with normal BS GU:  Bladder non-palpable Extremities:  No LE edema, left upper extremity AV fistula Skin:  No skin rash  MEDICATIONS:  . calcium acetate  667 mg Oral TID WC  . Chlorhexidine Gluconate Cloth  6 each Topical Q0600  . [START ON 04/19/2019] doxercalciferol  1 mcg Intravenous Q T,Th,Sa-HD  . fentaNYL  1 patch Transdermal Q72H  . multivitamin  1 tablet Oral QHS  . nicotine  7 mg Transdermal Daily       LABS:   CBC Latest Ref Rng & Units 04/18/2019 04/17/2019 04/16/2019  WBC 4.0 - 10.5 K/uL 10.9(H) 11.2(H) 12.8(H)  Hemoglobin 13.0 - 17.0 g/dL 10.2(L) 11.2(L) 11.5(L)  Hematocrit 39.0 - 52.0 % 31.3(L) 34.1(L) 35.0(L)  Platelets 150 - 400 K/uL 120(L) 114(L) 122(L)    CMP Latest Ref Rng & Units 04/16/2019 04/15/2019 04/12/2019  Glucose 70 - 99 mg/dL 66(L) 91 136(H)  BUN 6 - 20 mg/dL 83(H) 77(H) 120(H)  Creatinine 0.61 - 1.24 mg/dL 8.12(H) 7.67(H) 10.01(H)  Sodium 135 - 145 mmol/L 133(L) 131(L) 132(L)  Potassium 3.5 - 5.1 mmol/L 5.6(H) 5.4(H) 5.8(H)  Chloride 98 - 111 mmol/L 93(L) 93(L) 95(L)  CO2 22 - 32 mmol/L 21(L) 22 18(L)  Calcium 8.9 - 10.3 mg/dL 7.0(L) 7.0(L) 6.2(LL)  Total Protein 6.5 - 8.1 g/dL - 5.8(L) -  Total Bilirubin 0.3 - 1.2 mg/dL - 1.3(H) -  Alkaline Phos 38 - 126 U/L - 158(H) -   AST 15 - 41 U/L - 29 -  ALT 0 - 44 U/L - 28 -    Lab Results  Component Value Date   PTH [facility] 04/12/2019   CALCIUM 7.0 (L) 04/16/2019   PHOS 7.6 (H) 04/16/2019    No results found for: COLORURINE, APPEARANCEUR, LABSPEC, PHURINE, GLUCOSEU, HGBUR, BILIRUBINUR, KETONESUR, PROTEINUR, UROBILINOGEN, NITRITE, LEUKOCYTESUR No results found for: PHART, PCO2ART, PO2ART, HCO3, TCO2, ACIDBASEDEF, O2SAT  No results found for: IRON, TIBC, FERRITIN, IRONPCTSAT    Dialysis Orders: [facility] 3h2/2.5Ca 800/450 65kg Hep none L AVF No Heparin hectorol 70mcg qHD    ASSESSMENT/PLAN:    1.  End-stage renal disease.  We will plan for dialysis on Tuesday, Thursday, and Saturday.  2.  Neck pain status post cervical spine surgery on 9/1.  Neurosurgery following.   3.  Anemia.  Hemoglobin above goal.  No ESA.  4.  Hypertension.  Blood pressures are stable.  Continue current Rx.  5.  Hypertension.  Blood pressures and volume status are stable.  6.  Alcoholic cirrhosis.  7.  History of congestive heart failure.  Looks euvolemic today.     [place], DO, [place]

## 2019-04-19 LAB — RENAL FUNCTION PANEL
Albumin: 2.8 g/dL — ABNORMAL LOW (ref 3.5–5.0)
Anion gap: 20 — ABNORMAL HIGH (ref 5–15)
BUN: 76 mg/dL — ABNORMAL HIGH (ref 6–20)
CO2: 22 mmol/L (ref 22–32)
Calcium: 6.5 mg/dL — ABNORMAL LOW (ref 8.9–10.3)
Chloride: 90 mmol/L — ABNORMAL LOW (ref 98–111)
Creatinine, Ser: 8.61 mg/dL — ABNORMAL HIGH (ref 0.61–1.24)
GFR calc Af Amer: 8 mL/min — ABNORMAL LOW (ref >60)
GFR calc non Af Amer: 7 mL/min — ABNORMAL LOW (ref >60)
Glucose, Bld: 84 mg/dL (ref 70–99)
Phosphorus: 8.7 mg/dL — ABNORMAL HIGH (ref 2.5–4.6)
Potassium: 6.1 mmol/L — ABNORMAL HIGH (ref 3.5–5.1)
Sodium: 132 mmol/L — ABNORMAL LOW (ref 135–145)

## 2019-04-19 LAB — CBC
HCT: 31.7 % — ABNORMAL LOW (ref 39.0–52.0)
Hemoglobin: 10.7 g/dL — ABNORMAL LOW (ref 13.0–17.0)
MCH: 34.4 pg — ABNORMAL HIGH (ref 26.0–34.0)
MCHC: 33.8 g/dL (ref 30.0–36.0)
MCV: 101.9 fL — ABNORMAL HIGH (ref 80.0–100.0)
Platelets: 120 10*3/uL — ABNORMAL LOW (ref 150–400)
RBC: 3.11 MIL/uL — ABNORMAL LOW (ref 4.22–5.81)
RDW: 16.8 % — ABNORMAL HIGH (ref 11.5–15.5)
WBC: 12.1 10*3/uL — ABNORMAL HIGH (ref 4.0–10.5)
nRBC: 0 % (ref 0.0–0.2)

## 2019-04-19 LAB — C-REACTIVE PROTEIN: CRP: 15.6 mg/dL — ABNORMAL HIGH (ref <1.0)

## 2019-04-19 MED ORDER — DOXERCALCIFEROL 4 MCG/2ML IV SOLN
INTRAVENOUS | Status: AC
  Filled 2019-04-19: qty 2

## 2019-04-19 MED ORDER — LIDOCAINE HCL (PF) 1 % IJ SOLN: 5.0000 mL | INTRAMUSCULAR | Status: DC | PRN

## 2019-04-19 MED ORDER — SODIUM CHLORIDE 0.9 % IV SOLN: 100.0000 mL | INTRAVENOUS | Status: DC | PRN

## 2019-04-19 MED ORDER — HEPARIN SODIUM (PORCINE) 5000 UNIT/ML IJ SOLN
5000.0000 [IU] | Freq: Three times a day (TID) | INTRAMUSCULAR | Status: DC
  Administered 2019-04-19 – 2019-04-21 (×6): 5000 [IU] via SUBCUTANEOUS
  Filled 2019-04-19 (×6): qty 1

## 2019-04-19 MED ORDER — LIDOCAINE-PRILOCAINE 2.5-2.5 % EX CREA: 1.0000 "application " | TOPICAL_CREAM | CUTANEOUS | Status: DC | PRN

## 2019-04-19 MED ORDER — PENTAFLUOROPROP-TETRAFLUOROETH EX AERO: 1.0000 "application " | INHALATION_SPRAY | CUTANEOUS | Status: DC | PRN

## 2019-04-19 NOTE — Progress Notes (Signed)
Phone report given to Tawanna Solo RN in HD. She stated they'll be after the pt in 30 min.

## 2019-04-19 NOTE — Progress Notes (Signed)
Transporter here to take pt to HD. Pt refusing to go until he eats his breakfast. Transporter informed pt if he didn't go now it's undetermined when they can come back and pick him up for HD. Pt stated he wasn't going now because his breakfast just came.

## 2019-04-19 NOTE — Progress Notes (Signed)
[facility]    NEPHROLOGY PROGRESS NOTE  SUBJECTIVE: on HD today.  In C-collar      OBJECTIVE:  Vitals:   04/19/19 1030 04/19/19 1100  BP: (!) 152/72 (!) 157/73  Pulse: 89 89  Resp:    Temp:    SpO2:      Intake/Output Summary (Last 24 hours) at 04/19/2019 1141 Last data filed at 04/18/2019 1900 Gross per 24 hour  Intake 840 ml  Output -  Net 840 ml      General:  AAOx3 NAD  Neck:  In C-collar CV:  Heart RRR  Lungs:  Clear bilaterally anteriorly Abd: normal BS Extremities:  No LE edema, left upper extremity AV fistula  MEDICATIONS:  . doxercalciferol      . calcium acetate  667 mg Oral TID WC  . Chlorhexidine Gluconate Cloth  6 each Topical Q0600  . doxercalciferol  1 mcg Intravenous Q T,Th,Sa-HD  . fentaNYL  1 patch Transdermal Q72H  . heparin injection (subcutaneous)  5,000 Units Subcutaneous Q8H  . multivitamin  1 tablet Oral QHS  . nicotine  7 mg Transdermal Daily       LABS:   CBC Latest Ref Rng & Units 04/19/2019 04/18/2019 04/17/2019  WBC 4.0 - 10.5 K/uL 12.1(H) 10.9(H) 11.2(H)  Hemoglobin 13.0 - 17.0 g/dL 10.7(L) 10.2(L) 11.2(L)  Hematocrit 39.0 - 52.0 % 31.7(L) 31.3(L) 34.1(L)  Platelets 150 - 400 K/uL 120(L) 120(L) 114(L)    CMP Latest Ref Rng & Units 04/19/2019 04/16/2019 04/15/2019  Glucose 70 - 99 mg/dL 84 66(L) 91  BUN 6 - 20 mg/dL 76(H) 83(H) 77(H)  Creatinine 0.61 - 1.24 mg/dL 8.61(H) 8.12(H) 7.67(H)  Sodium 135 - 145 mmol/L 132(L) 133(L) 131(L)  Potassium 3.5 - 5.1 mmol/L 6.1(H) 5.6(H) 5.4(H)  Chloride 98 - 111 mmol/L 90(L) 93(L) 93(L)  CO2 22 - 32 mmol/L 22 21(L) 22  Calcium 8.9 - 10.3 mg/dL 6.5(L) 7.0(L) 7.0(L)  Total Protein 6.5 - 8.1 g/dL - - 5.8(L)  Total Bilirubin 0.3 - 1.2 mg/dL - - 1.3(H)  Alkaline Phos 38 - 126 U/L - - 158(H)  AST 15 - 41 U/L - - 29  ALT 0 - 44 U/L - - 28    Lab Results  Component Value Date   PTH [facility] 04/12/2019   CALCIUM 6.5 (L) 04/19/2019   PHOS 8.7 (H) 04/19/2019    No results found  for: COLORURINE, APPEARANCEUR, LABSPEC, PHURINE, GLUCOSEU, HGBUR, BILIRUBINUR, KETONESUR, PROTEINUR, UROBILINOGEN, NITRITE, LEUKOCYTESUR No results found for: PHART, PCO2ART, PO2ART, HCO3, TCO2, ACIDBASEDEF, O2SAT  No results found for: IRON, TIBC, FERRITIN, IRONPCTSAT    Dialysis Orders: [facility] 3h2/2.5Ca 800/450 65kg Hep none L AVF No Heparin hectorol 40mcg qHD    ASSESSMENT/PLAN:    1.  End-stage renal disease.  We will plan for dialysis on Tuesday, Thursday, and Saturday.  HD today, 2K bath  2.  Neck pain status post cervical spine surgery on 9/1.  Neurosurgery following.   3.  Anemia.  Hemoglobin above goal.  No ESA.  4.  Hypertension.  Blood pressures are stable.  Continue current Rx.  5.  Hypertension.  Blood pressures and volume status are stable.  6.  Alcoholic cirrhosis.  7.  History of congestive heart failure.  Looks euvolemic today.   Madelon Lips MD

## 2019-04-19 NOTE — Progress Notes (Addendum)
Pt received resting in bed, no distress. He has eaten breakfast and says he is ready for dialysis. He reports 7/10 pain, says it is "okay". Aspen collar on and in place.

## 2019-04-19 NOTE — Progress Notes (Signed)
No significant change in status.  Believe patient's posterior cervical pain is incisional in nature.  No indication for any further surgical intervention.  Continue supportive efforts.  Okay to transfer to skilled nursing when bed available.

## 2019-04-19 NOTE — Progress Notes (Signed)
PROGRESS NOTE                                                                                                                                                                                                             Patient Demographics:    Mario [name], is a 48 y.o. male, DOB - 1971-03-30, KDX:[id number]  Outpatient Primary MD for the patient is [name], MD    LOS - 3  Admit date - 04/15/2019    Chief Complaint  Patient presents with   Neck Pain       Brief Narrative  Mario [name] is a 48 y.o. male with medical history significant of recent neck surgery, chronic combined systolic and diastolic congestive heart failure, liver cirrhosis, ESRD on HD, hypertension presenting to the hospital for evaluation of neck pain.  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  He now presents with worsening neck pain for the last few days and continues to have bilateral arm discomfort she describes as bilateral tingling and some numbness.  In the ER work-up suggestive of some fluid collection around the postop C-spine area, neurosurgery was consulted and hospitalist team was requested to admit.   Subjective:   Patient in bed, getting dialysis.  Still has neck pain.  Overall mildly improved.   Assessment  & Plan :     1.  Neck pain, bilateral upper extremity tingling and numbness in a patient with recent anterior C-spine surgery.  MRI noted, findings nonspecific, patient afebrile but does have mild leukocytosis, case discussed with Dr. Trenton Gammon neurosurgery on 04/19/2019.  For now continue to monitor, c-collar for total of 6 weeks, no antibiotics for now.    Procalcitonin is 2.3 which is rather questionable to interpret in the setting of  ESRD, currently afebrile, blood cultures have been ordered which are thus far negative, CRP was elevated at around 16, continue fentanyl patch, muscle relaxers and PRN breakthrough narcotic.  PT OT and advance activity.  Will require SNF.  2.  ESRD.  On Tuesday, Thursday, Saturday schedule.  Nephrology on board, was dialyzed 04/17/2019 early in the morning.  3.  History of smoking.  Counseled to quit.  Has NicoDerm patch.   4.  History of hypertension.  Placed on hydralazine as needed for now.  5.  Anemia of chronic disease and chronic thrombocytopenia.  Monitor.   Condition - Fair  Family Communication  :  None  Code Status :  Full  Diet :   Diet Order            Diet renal with fluid restriction Fluid restriction: 1200 mL Fluid; Room service appropriate? Yes; Fluid consistency: Thin  Diet effective now               Disposition Plan  :    Consults  : N.Surg, Nephrology  Procedures  :     MRI - 1. Severely motion degraded examination, also truncated due to patient discomfort. 2. Postsurgical edema and fluid in the posterior paraspinal soft tissues without specific evidence of infection. Prevertebral effusion is also likely postoperative. 3. Poor visualization of the spinal cord and spinal canal, particularly at the operative levels, but I suspect there is moderate-to-severe stenosis at the C5-6 level, where the CSF space is effaced.   PUD Prophylaxis :    DVT Prophylaxis  :    Heparin    Lab Results  Component Value Date   PLT 120 (L) 04/19/2019    Inpatient Medications  Scheduled Meds:  doxercalciferol       calcium acetate  667 mg Oral TID WC   Chlorhexidine Gluconate Cloth  6 each Topical Q0600   doxercalciferol  1 mcg Intravenous Q T,Th,Sa-HD   fentaNYL  1 patch Transdermal Q72H   heparin injection (subcutaneous)  5,000 Units Subcutaneous Q8H   multivitamin  1 tablet Oral QHS   nicotine  7 mg Transdermal Daily   Continuous Infusions:  sodium  chloride     sodium chloride     PRN Meds:.sodium chloride, sodium chloride, acetaminophen **OR** acetaminophen, lidocaine (PF), lidocaine-prilocaine, methocarbamol, morphine injection, oxyCODONE, pentafluoroprop-tetrafluoroeth  Antibiotics  :    Anti-infectives (From admission, onward)   None       Time Spent in minutes  30   Lala Lund M.D on 04/19/2019 at 11:36 AM  To page go to [email or web address] - password [facility]  [facility] -  Office  [phone]    See all Orders from today for further details    Objective:   Vitals:   04/19/19 0930 04/19/19 1000 04/19/19 1030 04/19/19 1100  BP: 137/75 (!) 160/75 (!) 152/72 (!) 157/73  Pulse: 94 88 89 89  Resp:      Temp:      TempSrc:      SpO2:      Weight:      Height:        Wt Readings from Last 3 Encounters:  04/19/19 73 kg  04/12/19 69.8 kg  04/01/19 65.7 kg     Intake/Output Summary (Last 24 hours) at 04/19/2019 1136 Last data filed at 04/18/2019 1900 Gross per 24 hour  Intake 840 ml  Output --  Net 840 ml     Physical Exam  Awake Alert, No new F.N deficits,   [place].AT,PERRAL Neck remains in c-collar Symmetrical Chest wall movement, Good air movement bilaterally, CTAB RRR,No Gallops, Rubs or new Murmurs, No Parasternal Heave +ve B.Sounds, Abd Soft, No tenderness, No organomegaly appriciated, No rebound - guarding or rigidity. No Cyanosis, Clubbing or edema, No new Rash or bruise     Data Review:    CBC Recent Labs  Lab 04/15/19 1636 04/16/19 0420 04/17/19 0658 04/18/19 0324 04/19/19 0234  WBC 13.7* 12.8*  11.2* 10.9* 12.1*  HGB 12.7* 11.5* 11.2* 10.2* 10.7*  HCT 39.8 35.0* 34.1* 31.3* 31.7*  PLT 126* 122* 114* 120* 120*  MCV 103.4* 102.6* 102.1* 104.0* 101.9*  MCH 33.0 33.7 33.5 33.9 34.4*  MCHC 31.9 32.9 32.8 32.6 33.8  RDW 17.1* 17.2* 17.2* 17.1* 16.8*    Chemistries  Recent Labs  Lab 04/12/19 1349 04/15/19 1636 04/16/19 0420 04/19/19 0649  NA 132* 131* 133* 132*  K 5.8*  5.4* 5.6* 6.1*  CL 95* 93* 93* 90*  CO2 18* 22 21* 22  GLUCOSE 136* 91 66* 84  BUN 120* 77* 83* 76*  CREATININE 10.01* 7.67* 8.12* 8.61*  CALCIUM 6.2* 7.0* 7.0* 6.5*  AST  --  29  --   --   ALT  --  28  --   --   ALKPHOS  --  158*  --   --   BILITOT  --  1.3*  --   --    ------------------------------------------------------------------------------------------------------------------ No results for input(s): CHOL, HDL, LDLCALC, TRIG, CHOLHDL, LDLDIRECT in the last 72 hours.  Lab Results  Component Value Date   HGBA1C 5.3 04/04/2019   ------------------------------------------------------------------------------------------------------------------ No results for input(s): TSH, T4TOTAL, T3FREE, THYROIDAB in the last 72 hours.  Invalid input(s): FREET3  Cardiac Enzymes No results for input(s): CKMB, TROPONINI, MYOGLOBIN in the last 168 hours.  Invalid input(s): CK ------------------------------------------------------------------------------------------------------------------    Component Value Date/Time   BNP 784.0 (H) 10/13/2016 1307    Micro Results Recent Results (from the past 240 hour(s))  SARS Coronavirus 2 [facility] order, Performed in [facility] hospital lab) Nasopharyngeal Nasopharyngeal Swab     Status: None   Collection Time: 04/15/19 11:19 PM   Specimen: Nasopharyngeal Swab  Result Value Ref Range Status   SARS Coronavirus 2 NEGATIVE NEGATIVE Final    Comment: (NOTE) If result is NEGATIVE SARS-CoV-2 target nucleic acids are NOT DETECTED. The SARS-CoV-2 RNA is generally detectable in upper and lower  respiratory specimens during the acute phase of infection. The lowest  concentration of SARS-CoV-2 viral copies this assay can detect is 250  copies / mL. A negative result does not preclude SARS-CoV-2 infection  and should not be used as the sole basis for treatment or other  patient management decisions.  A negative result may occur with  improper specimen  collection / handling, submission of specimen other  than nasopharyngeal swab, presence of viral mutation(s) within the  areas targeted by this assay, and inadequate number of viral copies  (<250 copies / mL). A negative result must be combined with clinical  observations, patient history, and epidemiological information. If result is POSITIVE SARS-CoV-2 target nucleic acids are DETECTED. The SARS-CoV-2 RNA is generally detectable in upper and lower  respiratory specimens dur ing the acute phase of infection.  Positive  results are indicative of active infection with SARS-CoV-2.  Clinical  correlation with patient history and other diagnostic information is  necessary to determine patient infection status.  Positive results do  not rule out bacterial infection or co-infection with other viruses. If result is PRESUMPTIVE POSTIVE SARS-CoV-2 nucleic acids MAY BE PRESENT.   A presumptive positive result was obtained on the submitted specimen  and confirmed on repeat testing.  While 2019 novel coronavirus  (SARS-CoV-2) nucleic acids may be present in the submitted sample  additional confirmatory testing may be necessary for epidemiological  and / or clinical management purposes  to differentiate between  SARS-CoV-2 and other Sarbecovirus currently known to infect humans.  If clinically  indicated additional testing with an alternate test  methodology [phone]) is advised. The SARS-CoV-2 RNA is generally  detectable in upper and lower respiratory sp ecimens during the acute  phase of infection. The expected result is Negative. Fact Sheet for Patients:  StrictlyIdeas.no Fact Sheet for Healthcare Providers: BankingDealers.co.za This test is not yet approved or cleared by the [place] FDA and has been authorized for detection and/or diagnosis of SARS-CoV-2 by FDA under an Emergency Use Authorization (EUA).  This EUA will remain in effect  (meaning this test can be used) for the duration of the COVID-19 declaration under Section 564(b)(1) of the Act, 21 U.S.C. section 360bbb-3(b)(1), unless the authorization is terminated or revoked sooner. Performed at [facility], [place] [address] [place], [place] [address]   Culture, blood (routine x 2)     Status: None (Preliminary result)   Collection Time: 04/17/19 11:34 AM   Specimen: BLOOD RIGHT ARM  Result Value Ref Range Status   Specimen Description BLOOD RIGHT ARM  Final   Special Requests   Final    AEROBIC BOTTLE ONLY Blood Culture results may not be optimal due to an excessive volume of blood received in culture bottles   Culture   Final    NO GROWTH 2 DAYS Performed at [facility], [place] [address], [place], [place] [address]    Report Status PENDING  Incomplete  Culture, blood (routine x 2)     Status: None (Preliminary result)   Collection Time: 04/17/19 11:35 AM   Specimen: BLOOD RIGHT ARM  Result Value Ref Range Status   Specimen Description BLOOD RIGHT ARM  Final   Special Requests AEROBIC BOTTLE ONLY Blood Culture adequate volume  Final   Culture   Final    NO GROWTH 2 DAYS Performed at [facility], [place] [address], [place], [place] [address]    Report Status PENDING  Incomplete    Radiology Reports Dg Cervical Spine 2-3 Views  Result Date: 04/05/2019 CLINICAL DATA:  Cervical fusion EXAM: CERVICAL SPINE - 2-3 VIEW; DG C-ARM 1-60 MIN COMPARISON:  None. FLUOROSCOPY TIME:  Radiation Exposure Index (as provided by the fluoroscopic device): Not available If the device does not provide the exposure index: Fluoroscopy Time:  43 seconds Number of Acquired Images:  5 FINDINGS: Initial images demonstrate a surgical instrument anteriorly at what appears to be C6-7 at the area of vertebral collapse. Subsequent films demonstrate evidence of interbody fusion at C5-6 and C6-7 with anterior fixation. Subsequently posterior fixation elements are noted from C5-T1.  IMPRESSION: Cervical fusion both anteriorly and posteriorly. Electronically Signed   By: Inez Catalina M.D.   On: 04/05/2019 14:53   Ct Cervical Spine Wo Contrast  Result Date: 04/01/2019 CLINICAL DATA:  Neck pain. Bilateral arm numbness. Dialysis patient EXAM: CT CERVICAL SPINE WITHOUT CONTRAST TECHNIQUE: Multidetector CT imaging of the cervical spine was performed without intravenous contrast. Multiplanar CT image reconstructions were also generated. COMPARISON:  CT cervical spine 03/23/2018 FINDINGS: Alignment: Mild retrolisthesis C3-4. Mild anterolisthesis C4-5. Marked kyphosis centered at C6-7 measuring 31 degrees. Kyphosis has progressed since the prior study due to progressive erosive changes of C6 and C7 vertebral bodies. Skull base and vertebrae: Erosive endplate changes at X7-3, C6-7, C7-T1 most likely due to spondyloarthropathy of dialysis. Progressive changes at C6-7 since the prior CT Soft tissues and spinal canal: Negative Disc levels:  C2-3: Negative C3-4: Well corticated erosive changes in the endplates at Z3-2 similar to the prior study. No  significant spinal stenosis C4-5: Negative C5-6: Negative C6-7: Erosive changes in the endplates bilaterally has progressed in the interval. There are erosive changes with loss of vertebral body volume of C6 and C7. Only a small amount of vertebral bodies present in the inferior endplate of C7. There is marked kyphosis at this level. Posterior spurring is present with mild spinal stenosis. Left facet subluxation with either degenerative or erosive changes in the facet. Possible instability. C7-T1: Erosive endplate changes similar to the prior study. 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 Marked progression of bony erosion at C6-7 with 31 degrees of kyphosis which has progressed in the interval. Subluxation of the  left C6-7 facet which may be degenerative or due to erosive arthropathy. Surgical consultation is suggested. These results were called by telephone at the time of interpretation on 04/01/2019 at 3:13 pm to Dr. Quentin Cornwall , who verbally acknowledged these results. Electronically Signed   By: Franchot Gallo M.D.   On: 04/01/2019 15:14   Mr Cervical Spine Wo Contrast  Result Date: 04/16/2019 CLINICAL DATA:  Neck pain with bilateral upper extremity tingling. EXAM: MRI CERVICAL SPINE WITHOUT CONTRAST TECHNIQUE: Multiplanar, multisequence MR imaging of the cervical spine was performed. No intravenous contrast was administered. COMPARISON:  None. FINDINGS: The examination was discontinued prior to completion due to patient discomfort. Alignment: Reversal of normal cervical lordosis. No static subluxation. Vertebrae: C5-T1 posterior fusion with C5-7 anterior fusion. Cord: Visualization of the spinal cord is limited due to motion. There is unchanged mild hyperintense T2-weighted signal at the C6-7 level. Posterior Fossa, vertebral arteries, paraspinal tissues: Visualized posterior fossa is normal. There is fluid and edema within the posterior paraspinal soft tissues, not unexpected in the setting of recent surgery. Disc levels: Level by level assessment is somewhat limited due to the degree of motion. C2-3: No spinal canal stenosis. C3-4: Small disc osteophyte complex with at least mild spinal canal stenosis. C4-5: No spinal canal stenosis. C5-6: The CSF space surrounding the spinal cord is effaced, but motion and susceptibility artifact from surgical hardware otherwise limited assessment. There is probably moderate-to-severe stenosis at this level. C6-7: CSF space is narrowed due to ligamentum flavum redundancy. Motion and susceptibility artifacts obscure further assessment. C7-T1: Spinal canal is patent. IMPRESSION: 1. Severely motion degraded examination, also truncated due to patient discomfort. 2. Postsurgical edema  and fluid in the posterior paraspinal soft tissues without specific evidence of infection. Prevertebral effusion is also likely postoperative. 3. Poor visualization of the spinal cord and spinal canal, particularly at the operative levels, but I suspect there is moderate-to-severe stenosis at the C5-6 level, where the CSF space is effaced. Electronically Signed   By: Ulyses Jarred M.D.   On: 04/16/2019 03:56   Mr Cervical Spine Wo Contrast  Result Date: 04/03/2019 CLINICAL DATA:  Instead age renal disease on chronic hemodialysis. Progressive neck pain with upper extremity pain, tingling and weakness. Pathologic fracture. EXAM: MRI CERVICAL SPINE WITHOUT CONTRAST TECHNIQUE: Multiplanar, multisequence MR imaging of the cervical spine was performed. No intravenous contrast was administered. COMPARISON:  CT 04/01/2019 and 03/23/2018 FINDINGS: Alignment: Kyphotic curvature of the spine at C6-7. Vertebrae: Chronic endplate irregularity at C3-4. Chronic but progressive collapse of the C6 and C7 vertebral bodies with kyphotic angulation and retropulsion. Chronic but progressive superior endplate deformity at T1. Cord: Cord indentation at C3-4 and C4-5 because of spinal stenosis with AP diameter of the canal  7-7.5 mm. Spinal stenosis at C6-7 with AP diameter of the canal only 6.3 mm with cord deformity. Abnormal T2 signal cord. Posterior Fossa, vertebral arteries, paraspinal tissues: Otherwise negative Disc levels: Foramen magnum is widely patent.  C1-2 and C2-3 are normal. C3-4: Chronic spondylosis/spondyloarthropathy. Bulging of the disc. Mild ligamentous hypertrophy. Spinal stenosis with AP diameter 7.3 mm. Slight indentation of cord. Bilateral foraminal stenosis. C4-5: Bulging of the disc. Ligamentous hypertrophy. Spinal stenosis with AP diameter of the canal 7.6 mm. Slight indentation of the cord. Mild foraminal narrowing right more than left. C5-6: Mild bulging of the disc. No compressive canal or foraminal  stenosis. C6-7: Vertebral body collapse and kyphotic angulation as described above. Spinal stenosis with AP diameter of the canal only 6.3 mm. Cord deformity and increased T2 cord signal. Bilateral foraminal stenosis. C7-T1: Mild bulging of the disc. Superior endplate deformity at T1. Canal and foramina sufficiently patent. Upper thoracic region negative. IMPRESSION: Likely dialysis related spondyloarthropathy with vertebral body collapse and kyphotic curvature at C6 and C7. Canal stenosis with AP diameter only 6.3 mm, effacing the subarachnoid space and deforming the cord without abnormal T2 signal within the cord. Severe bilateral foraminal stenosis at this level. Findings are progressive over time. Chronic spondyloarthropathy at C3-4 without progressive change since last year. Canal stenosis with AP diameter measuring 7.3 mm. Effacement of the subarachnoid space and slight indentation of the cord. Bilateral foraminal stenosis. C4-5 disc bulge. Canal stenosis with AP diameter of 7.6 mm. Slight indentation of the cord. No foraminal stenosis. Progressive superior endplate deformity at T1 over time but without canal or foraminal stenosis. Electronically Signed   By: Nelson Chimes M.D.   On: 04/03/2019 13:27   Dg Chest Portable 1 View  Result Date: 04/01/2019 CLINICAL DATA:  Pain between the shoulder blades and intermittent bilateral arm numbness for 2 weeks. EXAM: PORTABLE CHEST 1 VIEW COMPARISON:  Single-view of the chest 05/01/2018. FINDINGS: There is cardiomegaly and mild interstitial edema. No consolidative process, pneumothorax or effusion. Atherosclerosis noted. No acute or focal bony abnormality. IMPRESSION: Cardiomegaly mild interstitial edema. Atherosclerosis. Electronically Signed   By: Inge Rise M.D.   On: 04/01/2019 11:15   Dg C-arm 1-60 Min  Result Date: 04/05/2019 CLINICAL DATA:  Cervical fusion EXAM: CERVICAL SPINE - 2-3 VIEW; DG C-ARM 1-60 MIN COMPARISON:  None. FLUOROSCOPY TIME:   Radiation Exposure Index (as provided by the fluoroscopic device): Not available If the device does not provide the exposure index: Fluoroscopy Time:  43 seconds Number of Acquired Images:  5 FINDINGS: Initial images demonstrate a surgical instrument anteriorly at what appears to be C6-7 at the area of vertebral collapse. Subsequent films demonstrate evidence of interbody fusion at C5-6 and C6-7 with anterior fixation. Subsequently posterior fixation elements are noted from C5-T1. IMPRESSION: Cervical fusion both anteriorly and posteriorly. Electronically Signed   By: Inez Catalina M.D.   On: 04/05/2019 14:53

## 2019-04-20 LAB — CBC
HCT: 30.1 % — ABNORMAL LOW (ref 39.0–52.0)
Hemoglobin: 9.7 g/dL — ABNORMAL LOW (ref 13.0–17.0)
MCH: 33.7 pg (ref 26.0–34.0)
MCHC: 32.2 g/dL (ref 30.0–36.0)
MCV: 104.5 fL — ABNORMAL HIGH (ref 80.0–100.0)
Platelets: 103 10*3/uL — ABNORMAL LOW (ref 150–400)
RBC: 2.88 MIL/uL — ABNORMAL LOW (ref 4.22–5.81)
RDW: 16.3 % — ABNORMAL HIGH (ref 11.5–15.5)
WBC: 9.7 10*3/uL (ref 4.0–10.5)
nRBC: 0 % (ref 0.0–0.2)

## 2019-04-20 LAB — C-REACTIVE PROTEIN: CRP: 21.3 mg/dL — ABNORMAL HIGH (ref <1.0)

## 2019-04-20 NOTE — Progress Notes (Signed)
[facility]    NEPHROLOGY PROGRESS NOTE  SUBJECTIVE: s/p HD yesterday, 2.5L UF.  Still reporting neck pain, no surgical intervention necessary per NSG.  Has a pedialyte-type beverage at bedside which has significant K content- discussed with pt (K was 6.1 pre-dialysis yesterday).     OBJECTIVE:  Vitals:   04/20/19 0351 04/20/19 0757  BP: 131/68 138/77  Pulse: 83 79  Resp: 17 18  Temp: 98.3 F (36.8 C) 98.4 F (36.9 C)  SpO2: 98% 100%    Intake/Output Summary (Last 24 hours) at 04/20/2019 1139 Last data filed at 04/20/2019 0800 Gross per 24 hour  Intake 600 ml  Output 2500 ml  Net -1900 ml      General:  AAOx3 NAD  Neck:  In C-collar CV:  Heart RRR  Lungs:  Clear bilaterally anteriorly Abd: normal BS Extremities:  No LE edema, left upper extremity AV fistula-- aneurysmal  MEDICATIONS:  . calcium acetate  667 mg Oral TID WC  . Chlorhexidine Gluconate Cloth  6 each Topical Q0600  . doxercalciferol  1 mcg Intravenous Q T,Th,Sa-HD  . fentaNYL  1 patch Transdermal Q72H  . heparin injection (subcutaneous)  5,000 Units Subcutaneous Q8H  . multivitamin  1 tablet Oral QHS  . nicotine  7 mg Transdermal Daily       LABS:   CBC Latest Ref Rng & Units 04/20/2019 04/19/2019 04/18/2019  WBC 4.0 - 10.5 K/uL 9.7 12.1(H) 10.9(H)  Hemoglobin 13.0 - 17.0 g/dL 9.7(L) 10.7(L) 10.2(L)  Hematocrit 39.0 - 52.0 % 30.1(L) 31.7(L) 31.3(L)  Platelets 150 - 400 K/uL 103(L) 120(L) 120(L)    CMP Latest Ref Rng & Units 04/19/2019 04/16/2019 04/15/2019  Glucose 70 - 99 mg/dL 84 66(L) 91  BUN 6 - 20 mg/dL 76(H) 83(H) 77(H)  Creatinine 0.61 - 1.24 mg/dL 8.61(H) 8.12(H) 7.67(H)  Sodium 135 - 145 mmol/L 132(L) 133(L) 131(L)  Potassium 3.5 - 5.1 mmol/L 6.1(H) 5.6(H) 5.4(H)  Chloride 98 - 111 mmol/L 90(L) 93(L) 93(L)  CO2 22 - 32 mmol/L 22 21(L) 22  Calcium 8.9 - 10.3 mg/dL 6.5(L) 7.0(L) 7.0(L)  Total Protein 6.5 - 8.1 g/dL - - 5.8(L)  Total Bilirubin 0.3 - 1.2 mg/dL - - 1.3(H)   Alkaline Phos 38 - 126 U/L - - 158(H)  AST 15 - 41 U/L - - 29  ALT 0 - 44 U/L - - 28    Lab Results  Component Value Date   PTH [facility] 04/12/2019   CALCIUM 6.5 (L) 04/19/2019   PHOS 8.7 (H) 04/19/2019    No results found for: COLORURINE, APPEARANCEUR, LABSPEC, PHURINE, GLUCOSEU, HGBUR, BILIRUBINUR, KETONESUR, PROTEINUR, UROBILINOGEN, NITRITE, LEUKOCYTESUR No results found for: PHART, PCO2ART, PO2ART, HCO3, TCO2, ACIDBASEDEF, O2SAT  No results found for: IRON, TIBC, FERRITIN, IRONPCTSAT    Dialysis Orders: [facility] TTS /[facility] 3h2/2.5Ca 800/450 65kg Hep none L AVF No Heparin hectorol 23mcg qHD    ASSESSMENT/PLAN:    1.  End-stage renal disease.  We will plan for dialysis on Tuesday, Thursday, and Saturday.  Next HD 9/17 if still here, 2K bath, will plan for first shift.  Discussed low K diet.  2.  Neck pain status post cervical spine surgery on 9/1.  Neurosurgery following.   3.  Anemia.  Hemoglobin above goal.  No ESA.  4.  Hypertension.  Blood pressures are stable.  Continue current Rx.  5.  Hypertension.  Blood pressures and volume status are stable.  6.  Alcoholic cirrhosis.  7.  History of congestive heart failure.  Looks euvolemic today.   Madelon Lips MD

## 2019-04-20 NOTE — Progress Notes (Signed)
PROGRESS NOTE                                                                                                                                                                                                             Patient Demographics:    Mario [name], is a 48 y.o. male, DOB - 1970/08/21, NIO:[id number]  Outpatient Primary MD for the patient is [name], MD    LOS - 4  Admit date - 04/15/2019    Chief Complaint  Patient presents with   Neck Pain       Brief Narrative  Mario [name] is a 48 y.o. male with medical history significant of recent neck surgery, chronic combined systolic and diastolic congestive heart failure, liver cirrhosis, ESRD on HD, hypertension presenting to the hospital for evaluation of neck pain.  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  He now presents with worsening neck pain for the last few days and continues to have bilateral arm discomfort she describes as bilateral tingling and some numbness.  In the ER work-up suggestive of some fluid collection around the postop C-spine area, neurosurgery was consulted and hospitalist team was requested to admit.   Subjective:   Patient in bed, in no distress this morning, states neck pain has improved.   Assessment  & Plan :     1.  Neck pain, bilateral upper extremity tingling and numbness in a patient with recent anterior C-spine surgery.  MRI noted, findings nonspecific, patient afebrile but does have mild leukocytosis, case discussed with Dr. Annette Stable neurosurgery on 04/19/2019.  For now continue to monitor, c-collar for total of 6 weeks, no antibiotics for now.    Procalcitonin is 2.3 which is rather questionable to interpret in the setting of ESRD,  currently afebrile, blood cultures have been ordered which are thus far negative, CRP was elevated at around 16, continue fentanyl patch, muscle relaxers and PRN breakthrough narcotic.  PT OT and advance activity.  Discharge on 04/21/2019 either to SNF or [facility].  2.  ESRD.  On Tuesday, Thursday, Saturday schedule.  Nephrology on board, was dialyzed 04/17/2019 early in the morning.  3.  History of smoking.  Counseled to quit.  Has  NicoDerm patch.   4.  History of hypertension.  Placed on hydralazine as needed for now.  5.  Anemia of chronic disease and chronic thrombocytopenia.  Monitor.   Condition - Fair  Family Communication  :  None  Code Status :  Full  Diet :   Diet Order            Diet renal with fluid restriction Fluid restriction: 1200 mL Fluid; Room service appropriate? Yes; Fluid consistency: Thin  Diet effective now               Disposition Plan  : Discharge on 04/21/2019 either to SNF or home health with PT.  Consults  : N.Surg, Nephrology  Procedures  :     MRI - 1. Severely motion degraded examination, also truncated due to patient discomfort. 2. Postsurgical edema and fluid in the posterior paraspinal soft tissues without specific evidence of infection. Prevertebral effusion is also likely postoperative. 3. Poor visualization of the spinal cord and spinal canal, particularly at the operative levels, but I suspect there is moderate-to-severe stenosis at the C5-6 level, where the CSF space is effaced.   PUD Prophylaxis :    DVT Prophylaxis  :    Heparin    Lab Results  Component Value Date   PLT 103 (L) 04/20/2019    Inpatient Medications  Scheduled Meds:  calcium acetate  667 mg Oral TID WC   Chlorhexidine Gluconate Cloth  6 each Topical Q0600   doxercalciferol  1 mcg Intravenous Q T,Th,Sa-HD   fentaNYL  1 patch Transdermal Q72H   heparin injection (subcutaneous)  5,000 Units Subcutaneous Q8H   multivitamin  1 tablet Oral QHS   nicotine  7 mg  Transdermal Daily   Continuous Infusions:  PRN Meds:.acetaminophen **OR** acetaminophen, methocarbamol, morphine injection, oxyCODONE  Antibiotics  :    Anti-infectives (From admission, onward)   None       Time Spent in minutes  30   Lala Lund M.D on 04/20/2019 at 12:08 PM  To page go to [email or web address] - password [facility]  [facility] -  Office  [phone]    See all Orders from today for further details    Objective:   Vitals:   04/19/19 1408 04/19/19 2002 04/20/19 0351 04/20/19 0757  BP: (!) 176/83 (!) 143/77 131/68 138/77  Pulse: 95 89 83 79  Resp: 16 17 17 18   Temp: 98.5 F (36.9 C) 99.7 F (37.6 C) 98.3 F (36.8 C) 98.4 F (36.9 C)  TempSrc: Oral Oral Oral Oral  SpO2: 100% 100% 98% 100%  Weight:      Height:        Wt Readings from Last 3 Encounters:  04/19/19 70.5 kg  04/12/19 69.8 kg  04/01/19 65.7 kg     Intake/Output Summary (Last 24 hours) at 04/20/2019 1208 Last data filed at 04/20/2019 0800 Gross per 24 hour  Intake 360 ml  Output --  Net 360 ml     Physical Exam  Awake Alert, Oriented X 3, No new F.N deficits, Normal affect Barry.AT,PERRAL Neck remains in c-collar Symmetrical Chest wall movement, Good air movement bilaterally, CTAB RRR,No Gallops, Rubs or new Murmurs, No Parasternal Heave +ve B.Sounds, Abd Soft, No tenderness, No organomegaly appriciated, No rebound - guarding or rigidity. No Cyanosis, Clubbing or edema, No new Rash or bruise    Data Review:    CBC Recent Labs  Lab 04/16/19 0420 04/17/19 0658 04/18/19 0324 04/19/19 0234 04/20/19 0235  WBC 12.8*  11.2* 10.9* 12.1* 9.7  HGB 11.5* 11.2* 10.2* 10.7* 9.7*  HCT 35.0* 34.1* 31.3* 31.7* 30.1*  PLT 122* 114* 120* 120* 103*  MCV 102.6* 102.1* 104.0* 101.9* 104.5*  MCH 33.7 33.5 33.9 34.4* 33.7  MCHC 32.9 32.8 32.6 33.8 32.2  RDW 17.2* 17.2* 17.1* 16.8* 16.3*    Chemistries  Recent Labs  Lab 04/15/19 1636 04/16/19 0420 04/19/19 0649  NA 131* 133*  132*  K 5.4* 5.6* 6.1*  CL 93* 93* 90*  CO2 22 21* 22  GLUCOSE 91 66* 84  BUN 77* 83* 76*  CREATININE 7.67* 8.12* 8.61*  CALCIUM 7.0* 7.0* 6.5*  AST 29  --   --   ALT 28  --   --   ALKPHOS 158*  --   --   BILITOT 1.3*  --   --    ------------------------------------------------------------------------------------------------------------------ No results for input(s): CHOL, HDL, LDLCALC, TRIG, CHOLHDL, LDLDIRECT in the last 72 hours.  Lab Results  Component Value Date   HGBA1C 5.3 04/04/2019   ------------------------------------------------------------------------------------------------------------------ No results for input(s): TSH, T4TOTAL, T3FREE, THYROIDAB in the last 72 hours.  Invalid input(s): FREET3  Cardiac Enzymes No results for input(s): CKMB, TROPONINI, MYOGLOBIN in the last 168 hours.  Invalid input(s): CK ------------------------------------------------------------------------------------------------------------------    Component Value Date/Time   BNP 784.0 (H) 10/13/2016 1307    Micro Results Recent Results (from the past 240 hour(s))  SARS Coronavirus 2 [facility] order, Performed in [facility] hospital lab) Nasopharyngeal Nasopharyngeal Swab     Status: None   Collection Time: 04/15/19 11:19 PM   Specimen: Nasopharyngeal Swab  Result Value Ref Range Status   SARS Coronavirus 2 NEGATIVE NEGATIVE Final    Comment: (NOTE) If result is NEGATIVE SARS-CoV-2 target nucleic acids are NOT DETECTED. The SARS-CoV-2 RNA is generally detectable in upper and lower  respiratory specimens during the acute phase of infection. The lowest  concentration of SARS-CoV-2 viral copies this assay can detect is 250  copies / mL. A negative result does not preclude SARS-CoV-2 infection  and should not be used as the sole basis for treatment or other  patient management decisions.  A negative result may occur with  improper specimen collection / handling, submission of  specimen other  than nasopharyngeal swab, presence of viral mutation(s) within the  areas targeted by this assay, and inadequate number of viral copies  (<250 copies / mL). A negative result must be combined with clinical  observations, patient history, and epidemiological information. If result is POSITIVE SARS-CoV-2 target nucleic acids are DETECTED. The SARS-CoV-2 RNA is generally detectable in upper and lower  respiratory specimens dur ing the acute phase of infection.  Positive  results are indicative of active infection with SARS-CoV-2.  Clinical  correlation with patient history and other diagnostic information is  necessary to determine patient infection status.  Positive results do  not rule out bacterial infection or co-infection with other viruses. If result is PRESUMPTIVE POSTIVE SARS-CoV-2 nucleic acids MAY BE PRESENT.   A presumptive positive result was obtained on the submitted specimen  and confirmed on repeat testing.  While 2019 novel coronavirus  (SARS-CoV-2) nucleic acids may be present in the submitted sample  additional confirmatory testing may be necessary for epidemiological  and / or clinical management purposes  to differentiate between  SARS-CoV-2 and other Sarbecovirus currently known to infect humans.  If clinically indicated additional testing with an alternate test  methodology [phone]) is advised. The SARS-CoV-2 RNA is generally  detectable in upper  and lower respiratory sp ecimens during the acute  phase of infection. The expected result is Negative. Fact Sheet for Patients:  StrictlyIdeas.no Fact Sheet for Healthcare Providers: BankingDealers.co.za This test is not yet approved or cleared by the [place] FDA and has been authorized for detection and/or diagnosis of SARS-CoV-2 by FDA under an Emergency Use Authorization (EUA).  This EUA will remain in effect (meaning this test can be used) for the  duration of the COVID-19 declaration under Section 564(b)(1) of the Act, 21 U.S.C. section 360bbb-3(b)(1), unless the authorization is terminated or revoked sooner. Performed at [facility], [place] [address] [place], [place] [address]   Culture, blood (routine x 2)     Status: None (Preliminary result)   Collection Time: 04/17/19 11:34 AM   Specimen: BLOOD RIGHT ARM  Result Value Ref Range Status   Specimen Description BLOOD RIGHT ARM  Final   Special Requests   Final    AEROBIC BOTTLE ONLY Blood Culture results may not be optimal due to an excessive volume of blood received in culture bottles   Culture   Final    NO GROWTH 3 DAYS Performed at [facility], [place] [address], [place], [place] [address]    Report Status PENDING  Incomplete  Culture, blood (routine x 2)     Status: None (Preliminary result)   Collection Time: 04/17/19 11:35 AM   Specimen: BLOOD RIGHT ARM  Result Value Ref Range Status   Specimen Description BLOOD RIGHT ARM  Final   Special Requests AEROBIC BOTTLE ONLY Blood Culture adequate volume  Final   Culture   Final    NO GROWTH 3 DAYS Performed at [facility], [place] [address] [place], [place] [address]    Report Status PENDING  Incomplete    Radiology Reports Dg Cervical Spine 2-3 Views  Result Date: 04/05/2019 CLINICAL DATA:  Cervical fusion EXAM: CERVICAL SPINE - 2-3 VIEW; DG C-ARM 1-60 MIN COMPARISON:  None. FLUOROSCOPY TIME:  Radiation Exposure Index (as provided by the fluoroscopic device): Not available If the device does not provide the exposure index: Fluoroscopy Time:  43 seconds Number of Acquired Images:  5 FINDINGS: Initial images demonstrate a surgical instrument anteriorly at what appears to be C6-7 at the area of vertebral collapse. Subsequent films demonstrate evidence of interbody fusion at C5-6 and C6-7 with anterior fixation. Subsequently posterior fixation elements are noted from C5-T1. IMPRESSION: Cervical fusion both  anteriorly and posteriorly. Electronically Signed   By: Inez Catalina M.D.   On: 04/05/2019 14:53   Ct Cervical Spine Wo Contrast  Result Date: 04/01/2019 CLINICAL DATA:  Neck pain. Bilateral arm numbness. Dialysis patient EXAM: CT CERVICAL SPINE WITHOUT CONTRAST TECHNIQUE: Multidetector CT imaging of the cervical spine was performed without intravenous contrast. Multiplanar CT image reconstructions were also generated. COMPARISON:  CT cervical spine 03/23/2018 FINDINGS: Alignment: Mild retrolisthesis C3-4. Mild anterolisthesis C4-5. Marked kyphosis centered at C6-7 measuring 31 degrees. Kyphosis has progressed since the prior study due to progressive erosive changes of C6 and C7 vertebral bodies. Skull base and vertebrae: Erosive endplate changes at Q0-0, C6-7, C7-T1 most likely due to spondyloarthropathy of dialysis. Progressive changes at C6-7 since the prior CT Soft tissues and spinal canal: Negative Disc levels:  C2-3: Negative C3-4: Well corticated erosive changes in the endplates at Q6-7 similar to the prior study. No significant spinal stenosis C4-5: Negative C5-6: Negative C6-7: Erosive changes in the endplates bilaterally has progressed in the interval. There are  erosive changes with loss of vertebral body volume of C6 and C7. Only a small amount of vertebral bodies present in the inferior endplate of C7. There is marked kyphosis at this level. Posterior spurring is present with mild spinal stenosis. Left facet subluxation with either degenerative or erosive changes in the facet. Possible instability. C7-T1: Erosive endplate changes similar to the prior study. 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 Marked progression of bony erosion at C6-7 with 31 degrees of kyphosis which has progressed in the interval. Subluxation of the left C6-7 facet which may be  degenerative or due to erosive arthropathy. Surgical consultation is suggested. These results were called by telephone at the time of interpretation on 04/01/2019 at 3:13 pm to Dr. Quentin Cornwall , who verbally acknowledged these results. Electronically Signed   By: Franchot Gallo M.D.   On: 04/01/2019 15:14   Mr Cervical Spine Wo Contrast  Result Date: 04/16/2019 CLINICAL DATA:  Neck pain with bilateral upper extremity tingling. EXAM: MRI CERVICAL SPINE WITHOUT CONTRAST TECHNIQUE: Multiplanar, multisequence MR imaging of the cervical spine was performed. No intravenous contrast was administered. COMPARISON:  None. FINDINGS: The examination was discontinued prior to completion due to patient discomfort. Alignment: Reversal of normal cervical lordosis. No static subluxation. Vertebrae: C5-T1 posterior fusion with C5-7 anterior fusion. Cord: Visualization of the spinal cord is limited due to motion. There is unchanged mild hyperintense T2-weighted signal at the C6-7 level. Posterior Fossa, vertebral arteries, paraspinal tissues: Visualized posterior fossa is normal. There is fluid and edema within the posterior paraspinal soft tissues, not unexpected in the setting of recent surgery. Disc levels: Level by level assessment is somewhat limited due to the degree of motion. C2-3: No spinal canal stenosis. C3-4: Small disc osteophyte complex with at least mild spinal canal stenosis. C4-5: No spinal canal stenosis. C5-6: The CSF space surrounding the spinal cord is effaced, but motion and susceptibility artifact from surgical hardware otherwise limited assessment. There is probably moderate-to-severe stenosis at this level. C6-7: CSF space is narrowed due to ligamentum flavum redundancy. Motion and susceptibility artifacts obscure further assessment. C7-T1: Spinal canal is patent. IMPRESSION: 1. Severely motion degraded examination, also truncated due to patient discomfort. 2. Postsurgical edema and fluid in the posterior  paraspinal soft tissues without specific evidence of infection. Prevertebral effusion is also likely postoperative. 3. Poor visualization of the spinal cord and spinal canal, particularly at the operative levels, but I suspect there is moderate-to-severe stenosis at the C5-6 level, where the CSF space is effaced. Electronically Signed   By: Ulyses Jarred M.D.   On: 04/16/2019 03:56   Mr Cervical Spine Wo Contrast  Result Date: 04/03/2019 CLINICAL DATA:  Instead age renal disease on chronic hemodialysis. Progressive neck pain with upper extremity pain, tingling and weakness. Pathologic fracture. EXAM: MRI CERVICAL SPINE WITHOUT CONTRAST TECHNIQUE: Multiplanar, multisequence MR imaging of the cervical spine was performed. No intravenous contrast was administered. COMPARISON:  CT 04/01/2019 and 03/23/2018 FINDINGS: Alignment: Kyphotic curvature of the spine at C6-7. Vertebrae: Chronic endplate irregularity at C3-4. Chronic but progressive collapse of the C6 and C7 vertebral bodies with kyphotic angulation and retropulsion. Chronic but progressive superior endplate deformity at T1. Cord: Cord indentation at C3-4 and C4-5 because of spinal stenosis with AP diameter of the canal 7-7.5 mm. Spinal stenosis at C6-7 with AP diameter of the canal only 6.3 mm with cord deformity. Abnormal T2 signal  cord. Posterior Fossa, vertebral arteries, paraspinal tissues: Otherwise negative Disc levels: Foramen magnum is widely patent.  C1-2 and C2-3 are normal. C3-4: Chronic spondylosis/spondyloarthropathy. Bulging of the disc. Mild ligamentous hypertrophy. Spinal stenosis with AP diameter 7.3 mm. Slight indentation of cord. Bilateral foraminal stenosis. C4-5: Bulging of the disc. Ligamentous hypertrophy. Spinal stenosis with AP diameter of the canal 7.6 mm. Slight indentation of the cord. Mild foraminal narrowing right more than left. C5-6: Mild bulging of the disc. No compressive canal or foraminal stenosis. C6-7: Vertebral body  collapse and kyphotic angulation as described above. Spinal stenosis with AP diameter of the canal only 6.3 mm. Cord deformity and increased T2 cord signal. Bilateral foraminal stenosis. C7-T1: Mild bulging of the disc. Superior endplate deformity at T1. Canal and foramina sufficiently patent. Upper thoracic region negative. IMPRESSION: Likely dialysis related spondyloarthropathy with vertebral body collapse and kyphotic curvature at C6 and C7. Canal stenosis with AP diameter only 6.3 mm, effacing the subarachnoid space and deforming the cord without abnormal T2 signal within the cord. Severe bilateral foraminal stenosis at this level. Findings are progressive over time. Chronic spondyloarthropathy at C3-4 without progressive change since last year. Canal stenosis with AP diameter measuring 7.3 mm. Effacement of the subarachnoid space and slight indentation of the cord. Bilateral foraminal stenosis. C4-5 disc bulge. Canal stenosis with AP diameter of 7.6 mm. Slight indentation of the cord. No foraminal stenosis. Progressive superior endplate deformity at T1 over time but without canal or foraminal stenosis. Electronically Signed   By: Nelson Chimes M.D.   On: 04/03/2019 13:27   Dg Chest Portable 1 View  Result Date: 04/01/2019 CLINICAL DATA:  Pain between the shoulder blades and intermittent bilateral arm numbness for 2 weeks. EXAM: PORTABLE CHEST 1 VIEW COMPARISON:  Single-view of the chest 05/01/2018. FINDINGS: There is cardiomegaly and mild interstitial edema. No consolidative process, pneumothorax or effusion. Atherosclerosis noted. No acute or focal bony abnormality. IMPRESSION: Cardiomegaly mild interstitial edema. Atherosclerosis. Electronically Signed   By: Inge Rise M.D.   On: 04/01/2019 11:15   Dg C-arm 1-60 Min  Result Date: 04/05/2019 CLINICAL DATA:  Cervical fusion EXAM: CERVICAL SPINE - 2-3 VIEW; DG C-ARM 1-60 MIN COMPARISON:  None. FLUOROSCOPY TIME:  Radiation Exposure Index (as provided  by the fluoroscopic device): Not available If the device does not provide the exposure index: Fluoroscopy Time:  43 seconds Number of Acquired Images:  5 FINDINGS: Initial images demonstrate a surgical instrument anteriorly at what appears to be C6-7 at the area of vertebral collapse. Subsequent films demonstrate evidence of interbody fusion at C5-6 and C6-7 with anterior fixation. Subsequently posterior fixation elements are noted from C5-T1. IMPRESSION: Cervical fusion both anteriorly and posteriorly. Electronically Signed   By: Inez Catalina M.D.   On: 04/05/2019 14:53

## 2019-04-20 NOTE — Progress Notes (Signed)
PT Cancellation Note  Patient Details Name: Mario [name] MRN: [id number] DOB: [birth year]   Cancelled Treatment:    Reason Eval/Treat Not Completed: Medical issues which prohibited therapy Per chart review, K+ 6.1 and is outside of safe range for exercise due to risk of cardiac arrythmias. Will monitor patient and initiate treatment when K+ levels improve and if time/schedule allow.    Deniece Ree PT, DPT, CBIS  Supplemental Physical Therapist [facility]    Pager [phone] Acute Rehab Office [phone]

## 2019-04-20 NOTE — Plan of Care (Signed)
  Problem: Education: Goal: Knowledge of General Education information will improve Description: Including pain rating scale, medication(s)/side effects and non-pharmacologic comfort measures Outcome: Progressing   Problem: Pain Managment: Goal: General experience of comfort will improve Outcome: Progressing   Problem: Safety: Goal: Ability to remain free from injury will improve Outcome: Progressing   

## 2019-04-21 LAB — C-REACTIVE PROTEIN: CRP: 25.4 mg/dL — ABNORMAL HIGH (ref <1.0)

## 2019-04-21 NOTE — Progress Notes (Signed)
Discharge instructions addressed in Spanish with interpreter via video chat;Pt in stable condition;not in respiratory distress.Pt to be picked up by family friend at the [employer] entrance.

## 2019-04-21 NOTE — Discharge Instructions (Signed)
Follow with Primary MD Holley Raring Munsoor, MD in 7 days   Get CBC, CMP, 2 view Chest X ray -  checked next visit within 1 week by Primary MD    Activity: As tolerated with Full fall precautions use walker/cane & assistance as needed.  Wearing a c-collar at all times for the next 6 weeks you can take it off only when taking a shower.  Do not move your neck when the c-collar is off.  Disposition Home   Diet: Renal with 1.2 lit /day fluid restriction  Special Instructions: If you have smoked or chewed Tobacco  in the last 2 yrs please stop smoking, stop any regular Alcohol  and or any Recreational drug use.  On your next visit with your primary care physician please Get Medicines reviewed and adjusted.  Please request your Prim.MD to go over all Hospital Tests and Procedure/Radiological results at the follow up, please get all Hospital records sent to your Prim MD by signing hospital release before you go home.  If you experience worsening of your admission symptoms, develop shortness of breath, life threatening emergency, suicidal or homicidal thoughts you must seek medical attention immediately by calling 911 or calling your MD immediately  if symptoms less severe.  You Must read complete instructions/literature along with all the possible adverse reactions/side effects for all the Medicines you take and that have been prescribed to you. Take any new Medicines after you have completely understood and accpet all the possible adverse reactions/side effects.   Do not drive, operate heavy machinery, perform activities at heights, swimming or participation in water activities or provide baby sitting services if your were admitted for syncope or siezures until you have seen by Primary MD or a Neurologist and advised to do so again.  Do not drive when taking Pain medications.  Do not take more than prescribed Pain, Sleep and Anxiety Medications  Wear Seat belts while driving.   Please note  You  were cared for by a hospitalist during your hospital stay. If you have any questions about your discharge medications or the care you received while you were in the hospital after you are discharged, you can call the unit and asked to speak with the hospitalist on call if the hospitalist that took care of you is not available. Once you are discharged, your primary care physician will handle any further medical issues. Please note that NO REFILLS for any discharge medications will be authorized once you are discharged, as it is imperative that you return to your primary care physician (or establish a relationship with a primary care physician if you do not have one) for your aftercare needs so that they can reassess your need for medications and monitor your lab values.

## 2019-04-21 NOTE — Progress Notes (Signed)
Pt going for dialysis during shift report.

## 2019-04-21 NOTE — Progress Notes (Signed)
PT Cancellation Note  Patient Details Name: Mario [name] MRN: [id number] DOB: 1971/07/08   Cancelled Treatment:    Reason Eval/Treat Not Completed: Patient at procedure or test/unavailable - Pt at HD, will check back as schedule allows.  Julien Girt, PT Acute Rehabilitation Services Pager [phone]  Office [phone]    Roxine Caddy D Elonda Husky 04/21/2019, 8:45 AM

## 2019-04-21 NOTE — Progress Notes (Signed)
   Providing Compassionate, Quality Care - Together   Subjective: Patient is in HD at the time of this assessment. Patient reports pain in his posterior c-spine and between his scapulae. He is asking about going home today.  Objective: Vital signs in last 24 hours: Temp:  [98.2 F (36.8 C)-99.8 F (37.7 C)] 98.5 F (36.9 C) (09/17 1123) Pulse Rate:  [80-96] 87 (09/17 1123) Resp:  [16-19] 16 (09/17 1123) BP: (129-182)/(38-89) 140/38 (09/17 1123) SpO2:  [100 %] 100 % (09/17 0739) Weight:  [70.5 kg] 70.5 kg (09/17 0739)  Intake/Output from previous day: 09/16 0701 - 09/17 0700 In: 840 [P.O.:840] Out: 0  Intake/Output this shift: Total I/O In: -  Out: 1951 [Other:1951]  Alert and oriented x 4 PERRLA MAE, Strength 5/5 BUE, BLE; grip slightly weaker on the left Incisions clean dry, intact, and well-approximated  Lab Results: Recent Labs    04/19/19 0234 04/20/19 0235  WBC 12.1* 9.7  HGB 10.7* 9.7*  HCT 31.7* 30.1*  PLT 120* 103*   BMET Recent Labs    04/19/19 0649  NA 132*  K 6.1*  CL 90*  CO2 22  GLUCOSE 84  BUN 76*  CREATININE 8.61*  CALCIUM 6.5*    Studies/Results: No results found.  Assessment/Plan: Patient is 16 days s/p anterior cervical corpectomy, microdissection, and posterior cervical fusion for cervical spine erosive changes due to renal spondyloarthropathy. He was discharged home on 04/13/2019, but returned on 04/15/2019 due to increased pain and incisional drainage per his home health nurse. Repeat MRI did not reveal findings consistent with infection. Last WBC 9.7. Patient's pain is better controlled. He is ready for discharge to SNF when medically ready.   LOS: 5 days    -Therapies recommending SNF at discharge -No further recommendations from Neurosurgery   Viona Gilmore, [place], AGNP-C Nurse Practitioner  [facility] [place] [address], [address], [place], [place] [address] P: [phone]    F: [phone]   04/21/2019, 10:55 AM

## 2019-04-21 NOTE — Progress Notes (Signed)
CSW spoke with patient via phone- patient is wanting to return home at this time and had already made arrangements for transportation. Patient is active with [facility] and has all DME equipment from previous admission. CSW notified Tiffany with [facility] that patient would be discharging today.   Kingsley Spittle, LCSW [facility] [facility]  [phone]

## 2019-04-21 NOTE — Discharge Summary (Addendum)
[address] Mario [name] WGY:[id number] DOB: [birth year] DOA: 04/15/2019  PCP: [name], MD  Admit date: 04/15/2019  Discharge date: 04/21/2019  Admitted From: Home   Disposition:  Home   Recommendations for Outpatient Follow-up:   Follow up with PCP in 1-2 weeks  PCP Please obtain BMP/CBC, 2 view CXR in 1week,  (see Discharge instructions)   PCP Please follow up on the following pending results:    Home Health: PT,RN   Equipment/Devices: None  Consultations: Neuro.Surg Discharge Condition: Stable    CODE STATUS: Full    Diet Recommendation: Renal     Chief Complaint  Patient presents with   Neck Pain     Brief history of present illness from the day of admission and additional interim summary    Mario [name] a 48 y.o.malewith medical history significant ofrecent neck surgery, chronic combined systolic and diastolic congestive heart failure, liver cirrhosis, ESRD on HD, hypertension presenting to the hospital for evaluation of neck pain. 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  He now presents with worsening neck pain for the last few days and continues to have bilateral arm discomfort she describes as bilateral tingling and some numbness.  In the ER work-up suggestive of some fluid collection around the postop C-spine area, neurosurgery was consulted and hospitalist team was requested to admit.                                                                 Hospital Course    1.  Neck pain, bilateral upper extremity tingling and numbness in a patient with recent anterior C-spine surgery.  MRI  noted, findings nonspecific, patient afebrile but does have mild leukocytosis, case discussed with Dr. Annette Stable neurosurgery on 04/19/2019.  For now continue to monitor, c-collar for total of 6 weeks, no antibiotics for now.    Procalcitonin is 2.3 which is rather questionable to interpret in the setting of ESRD, currently afebrile, blood cultures have been ordered which are thus far negative, CRP was elevated at around 16, continue fentanyl patch, muscle relaxers and PRN breakthrough narcotic.  PT OT and advance activity.    Case discussed with Dr. Trenton Gammon again in detail on 04/20/2019.  No change discharge on c-collar with follow-up with him in 1 to [redacted] weeks along with PCP in a week I will discharge him with home health PT.  Continue to wear c-collar.  No antibiotics.    2.  ESRD.  On Tuesday, Thursday, Saturday schedule.  Nephrology on board, was dialyzed 04/21/2019 early in the morning.  3.  History of smoking.  Counseled to quit.  Has NicoDerm patch.   4.  History of hypertension.  Resume home regimen..  5.  Anemia of chronic disease and chronic thrombocytopenia.  Monitor.   Discharge diagnosis     Principal Problem:   Neck pain Active Problems:   ESRD (end stage renal disease) on dialysis (HCC)   HTN (hypertension)   Tobacco abuse   Anemia   Neck pain, bilateral posterior    Discharge instructions    Discharge Instructions    Discharge instructions   Complete by: As directed    Follow with Primary MD Mario [name], [place], MD in 7 days   Get CBC, CMP, 2 view Chest X ray -  checked next visit within 1 week by Primary MD    Activity: As tolerated with Full fall precautions use walker/cane & assistance as needed.  Wearing a c-collar at all times for the next 6 weeks you can take it off only when taking a shower.  Do not move your neck when the c-collar is off.  Disposition Home   Diet: Renal with 1.2 lit /day fluid restriction  Special Instructions: If you have smoked or chewed  Tobacco  in the last 2 yrs please stop smoking, stop any regular Alcohol  and or any Recreational drug use.  On your next visit with your primary care physician please Get Medicines reviewed and adjusted.  Please request your Prim.MD to go over all Hospital Tests and Procedure/Radiological results at the follow up, please get all Hospital records sent to your Prim MD by signing hospital release before you go home.  If you experience worsening of your admission symptoms, develop shortness of breath, life threatening emergency, suicidal or homicidal thoughts you must seek medical attention immediately by calling 911 or calling your MD immediately  if symptoms less severe.  You Must read complete instructions/literature along with all the possible adverse reactions/side effects for all the Medicines you take and that have been prescribed to you. Take any new Medicines after you have completely understood and accpet all the possible adverse reactions/side effects.   Do not drive, operate heavy machinery, perform activities at heights, swimming or participation in water activities or provide baby sitting services if your were admitted for syncope or siezures until you have seen by Primary MD or a Neurologist and advised to do so again.  Do not drive when taking Pain medications.  Do not take more than prescribed Pain, Sleep and Anxiety Medications  Wear Seat belts while driving.   Please note  You were cared for by a hospitalist during your hospital stay. If you have any questions about your discharge medications or the care you received while you were in the hospital after you are discharged, you can call the unit and asked to speak with the hospitalist on call if the hospitalist that took care of you is not available. Once you are discharged, your primary care physician will handle any further medical issues. Please note that NO REFILLS for any discharge medications will be authorized once you are  discharged, as it is imperative that you return to your primary care physician (or establish a relationship with a primary care physician if you do not have one) for your aftercare needs so that they can reassess your need for medications and monitor your lab values.   Increase activity slowly   Complete by: As directed       Discharge Medications   Allergies as of 04/21/2019      Reactions   Betadine [povidone Iodine] Rash      Medication List  TAKE these medications   acetaminophen 500 MG tablet Commonly known as: TYLENOL Take 500 mg by mouth every 6 (six) hours as needed for mild pain.   amLODipine 10 MG tablet Commonly known as: NORVASC Take 1 tablet (10 mg total) by mouth daily.   Auryxia 1 GM 210 MG(Fe) tablet Generic drug: ferric citrate Take 420 mg by mouth 3 (three) times daily with meals.   cyclobenzaprine 10 MG tablet Commonly known as: FLEXERIL Take 1 tablet (10 mg total) by mouth 3 (three) times daily.   lisinopril 20 MG tablet Commonly known as: ZESTRIL Take 1 tablet (20 mg total) by mouth daily.   oxyCODONE 5 MG immediate release tablet Commonly known as: Oxy IR/ROXICODONE Take 1 tablet (5 mg total) by mouth every 6 (six) hours as needed for up to 15 doses for moderate pain.       Follow-up Information    Mario [name], [place], MD. Schedule an appointment as soon as possible for a visit in 1 week(s).   Specialty: Internal Medicine Contact information: [place] [address] [phone]        Dorothy Spark, MD .   Specialty: Cardiology Contact information: [place] [address] [place] [address] [phone]        Earnie Larsson, MD. Schedule an appointment as soon as possible for a visit in 1 week(s).   Specialty: Neurosurgery Contact information: [address] [place] [address] [place] [address] [phone]           Major procedures and Radiology Reports - PLEASE review detailed and final  reports thoroughly  -      Dg Cervical Spine 2-3 Views  Result Date: 04/05/2019 CLINICAL DATA:  Cervical fusion EXAM: CERVICAL SPINE - 2-3 VIEW; DG C-ARM 1-60 MIN COMPARISON:  None. FLUOROSCOPY TIME:  Radiation Exposure Index (as provided by the fluoroscopic device): Not available If the device does not provide the exposure index: Fluoroscopy Time:  43 seconds Number of Acquired Images:  5 FINDINGS: Initial images demonstrate a surgical instrument anteriorly at what appears to be C6-7 at the area of vertebral collapse. Subsequent films demonstrate evidence of interbody fusion at C5-6 and C6-7 with anterior fixation. Subsequently posterior fixation elements are noted from C5-T1. IMPRESSION: Cervical fusion both anteriorly and posteriorly. Electronically Signed   By: Inez Catalina M.D.   On: 04/05/2019 14:53   Ct Cervical Spine Wo Contrast  Result Date: 04/01/2019 CLINICAL DATA:  Neck pain. Bilateral arm numbness. Dialysis patient EXAM: CT CERVICAL SPINE WITHOUT CONTRAST TECHNIQUE: Multidetector CT imaging of the cervical spine was performed without intravenous contrast. Multiplanar CT image reconstructions were also generated. COMPARISON:  CT cervical spine 03/23/2018 FINDINGS: Alignment: Mild retrolisthesis C3-4. Mild anterolisthesis C4-5. Marked kyphosis centered at C6-7 measuring 31 degrees. Kyphosis has progressed since the prior study due to progressive erosive changes of C6 and C7 vertebral bodies. Skull base and vertebrae: Erosive endplate changes at K5-3, C6-7, C7-T1 most likely due to spondyloarthropathy of dialysis. Progressive changes at C6-7 since the prior CT Soft tissues and spinal canal: Negative Disc levels:  C2-3: Negative C3-4: Well corticated erosive changes in the endplates at Z7-6 similar to the prior study. No significant spinal stenosis C4-5: Negative C5-6: Negative C6-7: Erosive changes in the endplates bilaterally has progressed in the interval. There are erosive changes with loss of  vertebral body volume of C6 and C7. Only a small amount of vertebral bodies present in the inferior endplate of C7. There  is marked kyphosis at this level. Posterior spurring is present with mild spinal stenosis. Left facet subluxation with either degenerative or erosive changes in the facet. Possible instability. C7-T1: Erosive endplate changes similar to the prior study. 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 Marked progression of bony erosion at C6-7 with 31 degrees of kyphosis which has progressed in the interval. Subluxation of the left C6-7 facet which may be degenerative or due to erosive arthropathy. Surgical consultation is suggested. These results were called by telephone at the time of interpretation on 04/01/2019 at 3:13 pm to Dr. Quentin Cornwall , who verbally acknowledged these results. Electronically Signed   By: Franchot Gallo M.D.   On: 04/01/2019 15:14   Mr Cervical Spine Wo Contrast  Result Date: 04/16/2019 CLINICAL DATA:  Neck pain with bilateral upper extremity tingling. EXAM: MRI CERVICAL SPINE WITHOUT CONTRAST TECHNIQUE: Multiplanar, multisequence MR imaging of the cervical spine was performed. No intravenous contrast was administered. COMPARISON:  None. FINDINGS: The examination was discontinued prior to completion due to patient discomfort. Alignment: Reversal of normal cervical lordosis. No static subluxation. Vertebrae: C5-T1 posterior fusion with C5-7 anterior fusion. Cord: Visualization of the spinal cord is limited due to motion. There is unchanged mild hyperintense T2-weighted signal at the C6-7 level. Posterior Fossa, vertebral arteries, paraspinal tissues: Visualized posterior fossa is normal. There is fluid and edema within the posterior paraspinal soft tissues, not unexpected in the setting of recent surgery. Disc levels: Level by level  assessment is somewhat limited due to the degree of motion. C2-3: No spinal canal stenosis. C3-4: Small disc osteophyte complex with at least mild spinal canal stenosis. C4-5: No spinal canal stenosis. C5-6: The CSF space surrounding the spinal cord is effaced, but motion and susceptibility artifact from surgical hardware otherwise limited assessment. There is probably moderate-to-severe stenosis at this level. C6-7: CSF space is narrowed due to ligamentum flavum redundancy. Motion and susceptibility artifacts obscure further assessment. C7-T1: Spinal canal is patent. IMPRESSION: 1. Severely motion degraded examination, also truncated due to patient discomfort. 2. Postsurgical edema and fluid in the posterior paraspinal soft tissues without specific evidence of infection. Prevertebral effusion is also likely postoperative. 3. Poor visualization of the spinal cord and spinal canal, particularly at the operative levels, but I suspect there is moderate-to-severe stenosis at the C5-6 level, where the CSF space is effaced. Electronically Signed   By: Ulyses Jarred M.D.   On: 04/16/2019 03:56   Mr Cervical Spine Wo Contrast  Result Date: 04/03/2019 CLINICAL DATA:  Instead age renal disease on chronic hemodialysis. Progressive neck pain with upper extremity pain, tingling and weakness. Pathologic fracture. EXAM: MRI CERVICAL SPINE WITHOUT CONTRAST TECHNIQUE: Multiplanar, multisequence MR imaging of the cervical spine was performed. No intravenous contrast was administered. COMPARISON:  CT 04/01/2019 and 03/23/2018 FINDINGS: Alignment: Kyphotic curvature of the spine at C6-7. Vertebrae: Chronic endplate irregularity at C3-4. Chronic but progressive collapse of the C6 and C7 vertebral bodies with kyphotic angulation and retropulsion. Chronic but progressive superior endplate deformity at T1. Cord: Cord indentation at C3-4 and C4-5 because of spinal stenosis with AP diameter of the canal 7-7.5 mm. Spinal stenosis at C6-7  with AP diameter of the canal only 6.3 mm with cord deformity. Abnormal T2 signal cord. Posterior Fossa, vertebral arteries, paraspinal tissues: Otherwise negative Disc levels: Foramen magnum is widely patent.  C1-2 and C2-3 are normal. C3-4: Chronic spondylosis/spondyloarthropathy. Bulging of  the disc. Mild ligamentous hypertrophy. Spinal stenosis with AP diameter 7.3 mm. Slight indentation of cord. Bilateral foraminal stenosis. C4-5: Bulging of the disc. Ligamentous hypertrophy. Spinal stenosis with AP diameter of the canal 7.6 mm. Slight indentation of the cord. Mild foraminal narrowing right more than left. C5-6: Mild bulging of the disc. No compressive canal or foraminal stenosis. C6-7: Vertebral body collapse and kyphotic angulation as described above. Spinal stenosis with AP diameter of the canal only 6.3 mm. Cord deformity and increased T2 cord signal. Bilateral foraminal stenosis. C7-T1: Mild bulging of the disc. Superior endplate deformity at T1. Canal and foramina sufficiently patent. Upper thoracic region negative. IMPRESSION: Likely dialysis related spondyloarthropathy with vertebral body collapse and kyphotic curvature at C6 and C7. Canal stenosis with AP diameter only 6.3 mm, effacing the subarachnoid space and deforming the cord without abnormal T2 signal within the cord. Severe bilateral foraminal stenosis at this level. Findings are progressive over time. Chronic spondyloarthropathy at C3-4 without progressive change since last year. Canal stenosis with AP diameter measuring 7.3 mm. Effacement of the subarachnoid space and slight indentation of the cord. Bilateral foraminal stenosis. C4-5 disc bulge. Canal stenosis with AP diameter of 7.6 mm. Slight indentation of the cord. No foraminal stenosis. Progressive superior endplate deformity at T1 over time but without canal or foraminal stenosis. Electronically Signed   By: Nelson Chimes M.D.   On: 04/03/2019 13:27   Dg Chest Portable 1 View  Result  Date: 04/01/2019 CLINICAL DATA:  Pain between the shoulder blades and intermittent bilateral arm numbness for 2 weeks. EXAM: PORTABLE CHEST 1 VIEW COMPARISON:  Single-view of the chest 05/01/2018. FINDINGS: There is cardiomegaly and mild interstitial edema. No consolidative process, pneumothorax or effusion. Atherosclerosis noted. No acute or focal bony abnormality. IMPRESSION: Cardiomegaly mild interstitial edema. Atherosclerosis. Electronically Signed   By: Inge Rise M.D.   On: 04/01/2019 11:15   Dg C-arm 1-60 Min  Result Date: 04/05/2019 CLINICAL DATA:  Cervical fusion EXAM: CERVICAL SPINE - 2-3 VIEW; DG C-ARM 1-60 MIN COMPARISON:  None. FLUOROSCOPY TIME:  Radiation Exposure Index (as provided by the fluoroscopic device): Not available If the device does not provide the exposure index: Fluoroscopy Time:  43 seconds Number of Acquired Images:  5 FINDINGS: Initial images demonstrate a surgical instrument anteriorly at what appears to be C6-7 at the area of vertebral collapse. Subsequent films demonstrate evidence of interbody fusion at C5-6 and C6-7 with anterior fixation. Subsequently posterior fixation elements are noted from C5-T1. IMPRESSION: Cervical fusion both anteriorly and posteriorly. Electronically Signed   By: Inez Catalina M.D.   On: 04/05/2019 14:53    Micro Results    Recent Results (from the past 240 hour(s))  SARS Coronavirus 2 [facility] order, Performed in [facility] hospital lab) Nasopharyngeal Nasopharyngeal Swab     Status: None   Collection Time: 04/15/19 11:19 PM   Specimen: Nasopharyngeal Swab  Result Value Ref Range Status   SARS Coronavirus 2 NEGATIVE NEGATIVE Final    Comment: (NOTE) If result is NEGATIVE SARS-CoV-2 target nucleic acids are NOT DETECTED. The SARS-CoV-2 RNA is generally detectable in upper and lower  respiratory specimens during the acute phase of infection. The lowest  concentration of SARS-CoV-2 viral copies this assay can detect is 250    copies / mL. A negative result does not preclude SARS-CoV-2 infection  and should not be used as the sole basis for treatment or other  patient management decisions.  A negative result may occur with  improper  specimen collection / handling, submission of specimen other  than nasopharyngeal swab, presence of viral mutation(s) within the  areas targeted by this assay, and inadequate number of viral copies  (<250 copies / mL). A negative result must be combined with clinical  observations, patient history, and epidemiological information. If result is POSITIVE SARS-CoV-2 target nucleic acids are DETECTED. The SARS-CoV-2 RNA is generally detectable in upper and lower  respiratory specimens dur ing the acute phase of infection.  Positive  results are indicative of active infection with SARS-CoV-2.  Clinical  correlation with patient history and other diagnostic information is  necessary to determine patient infection status.  Positive results do  not rule out bacterial infection or co-infection with other viruses. If result is PRESUMPTIVE POSTIVE SARS-CoV-2 nucleic acids MAY BE PRESENT.   A presumptive positive result was obtained on the submitted specimen  and confirmed on repeat testing.  While 2019 novel coronavirus  (SARS-CoV-2) nucleic acids may be present in the submitted sample  additional confirmatory testing may be necessary for epidemiological  and / or clinical management purposes  to differentiate between  SARS-CoV-2 and other Sarbecovirus currently known to infect humans.  If clinically indicated additional testing with an alternate test  methodology [phone]) is advised. The SARS-CoV-2 RNA is generally  detectable in upper and lower respiratory sp ecimens during the acute  phase of infection. The expected result is Negative. Fact Sheet for Patients:  StrictlyIdeas.no Fact Sheet for Healthcare  Providers: BankingDealers.co.za This test is not yet approved or cleared by the [place] FDA and has been authorized for detection and/or diagnosis of SARS-CoV-2 by FDA under an Emergency Use Authorization (EUA).  This EUA will remain in effect (meaning this test can be used) for the duration of the COVID-19 declaration under Section 564(b)(1) of the Act, 21 U.S.C. section 360bbb-3(b)(1), unless the authorization is terminated or revoked sooner. Performed at [facility], [place] [address] [place], [place] [address]   Culture, blood (routine x 2)     Status: None (Preliminary result)   Collection Time: 04/17/19 11:34 AM   Specimen: BLOOD RIGHT ARM  Result Value Ref Range Status   Specimen Description BLOOD RIGHT ARM  Final   Special Requests   Final    AEROBIC BOTTLE ONLY Blood Culture results may not be optimal due to an excessive volume of blood received in culture bottles   Culture   Final    NO GROWTH 4 DAYS Performed at [facility], [place] [address], [place], [place] [address]    Report Status PENDING  Incomplete  Culture, blood (routine x 2)     Status: None (Preliminary result)   Collection Time: 04/17/19 11:35 AM   Specimen: BLOOD RIGHT ARM  Result Value Ref Range Status   Specimen Description BLOOD RIGHT ARM  Final   Special Requests AEROBIC BOTTLE ONLY Blood Culture adequate volume  Final   Culture   Final    NO GROWTH 4 DAYS Performed at [facility], [place] [address] [place], [place] [address]    Report Status PENDING  Incomplete    Today   Subjective    [place] Every today has no headache,no chest abdominal pain,no new weakness tingling or numbness, feels much better wants to go home today.    Objective   Blood pressure (!) 140/38, pulse 87, temperature 98.5 F (36.9 C), temperature source Oral, resp. rate 16, height 5\' 4"  (1.626 m), weight 68.7 kg, SpO2 96 %.  Intake/Output Summary (Last 24 hours) at  04/21/2019 1146 Last data filed at 04/21/2019 1123 Gross per 24 hour  Intake --  Output 1951 ml  Net -1951 ml    Exam  Awake Alert, Oriented x 3, No new F.N deficits, Normal affect [place].AT,PERRAL Wearing C collar Symmetrical Chest wall movement, Good air movement bilaterally, CTAB RRR,No Gallops,Rubs or new Murmurs, No Parasternal Heave +ve B.Sounds, Abd Soft, Non tender, No organomegaly appriciated, No rebound -guarding or rigidity. No Cyanosis, Clubbing or edema, No new Rash or bruise   Data Review   CBC w Diff:  Lab Results  Component Value Date   WBC 9.7 04/20/2019   HGB 9.7 (L) 04/20/2019   HCT 30.1 (L) 04/20/2019   PLT 103 (L) 04/20/2019   LYMPHOPCT 4 04/11/2019   MONOPCT 5 04/11/2019   EOSPCT 0 04/11/2019   BASOPCT 0 04/11/2019    CMP:  Lab Results  Component Value Date   NA 132 (L) 04/19/2019   K 6.1 (H) 04/19/2019   CL 90 (L) 04/19/2019   CO2 22 04/19/2019   BUN 76 (H) 04/19/2019   CREATININE 8.61 (H) 04/19/2019   PROT 5.8 (L) 04/15/2019   ALBUMIN 2.8 (L) 04/19/2019   BILITOT 1.3 (H) 04/15/2019   ALKPHOS 158 (H) 04/15/2019   AST 29 04/15/2019   ALT 28 04/15/2019  .   Total Time in preparing paper work, data evaluation and todays exam - 68 minutes  Lala Lund M.D on 04/21/2019 at 11:46 AM  [facility]   Office  [phone]

## 2019-04-21 NOTE — Progress Notes (Signed)
[facility]    NEPHROLOGY PROGRESS NOTE  SUBJECTIVE: HD today.  Upset that he didn't get to eat breakfast before coming to HD.  Possible d/c today?   OBJECTIVE:  Vitals:   04/21/19 0748 04/21/19 0800  BP: 139/69 (!) 182/89  Pulse: 88 96  Resp:    Temp:    SpO2:      Intake/Output Summary (Last 24 hours) at 04/21/2019 1010 Last data filed at 04/21/2019 0500 Gross per 24 hour  Intake 480 ml  Output 0 ml  Net 480 ml      General:  AAOx3 NAD  Neck:  In C-collar CV:  Heart RRR  Lungs:  Clear bilaterally anteriorly Abd: normal BS Extremities:  No LE edema, left upper extremity AV fistula-- aneurysmal  MEDICATIONS:  . calcium acetate  667 mg Oral TID WC  . Chlorhexidine Gluconate Cloth  6 each Topical Q0600  . doxercalciferol  1 mcg Intravenous Q T,Th,Sa-HD  . fentaNYL  1 patch Transdermal Q72H  . heparin injection (subcutaneous)  5,000 Units Subcutaneous Q8H  . multivitamin  1 tablet Oral QHS  . nicotine  7 mg Transdermal Daily       LABS:   CBC Latest Ref Rng & Units 04/20/2019 04/19/2019 04/18/2019  WBC 4.0 - 10.5 K/uL 9.7 12.1(H) 10.9(H)  Hemoglobin 13.0 - 17.0 g/dL 9.7(L) 10.7(L) 10.2(L)  Hematocrit 39.0 - 52.0 % 30.1(L) 31.7(L) 31.3(L)  Platelets 150 - 400 K/uL 103(L) 120(L) 120(L)    CMP Latest Ref Rng & Units 04/19/2019 04/16/2019 04/15/2019  Glucose 70 - 99 mg/dL 84 66(L) 91  BUN 6 - 20 mg/dL 76(H) 83(H) 77(H)  Creatinine 0.61 - 1.24 mg/dL 8.61(H) 8.12(H) 7.67(H)  Sodium 135 - 145 mmol/L 132(L) 133(L) 131(L)  Potassium 3.5 - 5.1 mmol/L 6.1(H) 5.6(H) 5.4(H)  Chloride 98 - 111 mmol/L 90(L) 93(L) 93(L)  CO2 22 - 32 mmol/L 22 21(L) 22  Calcium 8.9 - 10.3 mg/dL 6.5(L) 7.0(L) 7.0(L)  Total Protein 6.5 - 8.1 g/dL - - 5.8(L)  Total Bilirubin 0.3 - 1.2 mg/dL - - 1.3(H)  Alkaline Phos 38 - 126 U/L - - 158(H)  AST 15 - 41 U/L - - 29  ALT 0 - 44 U/L - - 28    Lab Results  Component Value Date   PTH [facility] 04/12/2019   CALCIUM 6.5 (L) 04/19/2019    PHOS 8.7 (H) 04/19/2019    No results found for: COLORURINE, APPEARANCEUR, LABSPEC, PHURINE, GLUCOSEU, HGBUR, BILIRUBINUR, KETONESUR, PROTEINUR, UROBILINOGEN, NITRITE, LEUKOCYTESUR No results found for: PHART, PCO2ART, PO2ART, HCO3, TCO2, ACIDBASEDEF, O2SAT  No results found for: IRON, TIBC, FERRITIN, IRONPCTSAT    Dialysis Orders: [facility] TTS /[facility] 3h2/2.5Ca 800/450 65kg Hep none L AVF No Heparin hectorol 42mcg qHD    ASSESSMENT/PLAN:    1.  End-stage renal disease.  We will plan for dialysis on Tuesday, Thursday, and Saturday.  Next HD 9/17, 2K bath, will plan for first shift.  Discussed low K diet.  2.  Neck pain status post cervical spine surgery on 9/1.  Neurosurgery following.   3.  Anemia.  Hemoglobin above goal.  No ESA.  4.  Hypertension.  Blood pressures are stable.  Continue current Rx.  5.  Alcoholic cirrhosis.  6.  History of congestive heart failure.  Looks euvolemic today.  7.  Dispo: ok to go from renal perspective at any time   Madelon Lips MD

## 2019-04-21 NOTE — Progress Notes (Signed)
Physical Therapy Treatment Patient Details Name: Mario [name] MRN: [id number] DOB: 09/05/1970 Today's Date: 04/21/2019    History of Present Illness 48 yo male admitted secondary to worsening pain and weakness following recent cervical surgery. Pt is recently s/p cervical fusion C5-C7  C6-7 fx with severe spinal stenosis with cord compression pMH: alcoholic cirrhosis, ERSD, HTN, CHF.    PT Comments    Pt eager to d/c home when PT arrived, so pt appeared somewhat irritated at points during session. Pt ambulated hallway distance with RW with no physical assist, but for all mobility pt requires min verbal cuing to maintain precautions and mobilize safely.  Pt unable to recall precautions to PT after PT stated them x2 during session. Pt proficiently climbed stairs this session, and pt assures PT that pt will have someone to move his RW to top/bottom of stairs when going into and out of house. Pt reported dizziness at end of session post-ambulation, but BP and HR WNL (see below).   PT updated recommendation to reflect HHPT with increased supervision assist at home for pt safety, and pt states he has family and friends that will help him although he will not have 24/7. Pt with no further questions for PT.   BP and HR post-ambulation, respectively: 182/76, 93 bpm  Interpreter: Michaelle Birks, [id number]   Follow Up Recommendations  Supervision/Assistance - 24 hour;Home health PT     Equipment Recommendations  None recommended by PT    Recommendations for Other Services       Precautions / Restrictions Precautions Precautions: Cervical Precaution Booklet Issued: Yes (comment) Precaution Comments: Cervical spine precautions (no bending neck/spine, no turning head/twisting back, no lifting >5 lbs, and no reaching UEs greater than eye level) reviewed, demonstrated, shown via handout and pictures described as pt does not read english or spanish. Required Braces or Orthoses: Cervical  Brace Cervical Brace: Hard collar;At all times Restrictions Weight Bearing Restrictions: No    Mobility  Bed Mobility Overal bed mobility: Needs Assistance Bed Mobility: Rolling;Sidelying to Sit;Sit to Sidelying Rolling: Supervision Sidelying to sit: Supervision     Sit to sidelying: Supervision General bed mobility comments: supervision for safety, min verbal cuing to reinforce log roll technique when moving from supine to sit.  Transfers Overall transfer level: Needs assistance Equipment used: Rolling walker (2 wheeled) Transfers: Sit to/from Stand Sit to Stand: Min guard         General transfer comment: min guard for safety, verbal cuing for pt to push up from bed versus holding onto RW, but pt pulled up on RW. Pt reinforced proper form after this for safety.  Ambulation/Gait Ambulation/Gait assistance: Min guard;Supervision Gait Distance (Feet): 350 Feet Assistive device: Rolling walker (2 wheeled) Gait Pattern/deviations: Step-through pattern;Decreased stride length;Narrow base of support;Trunk flexed Gait velocity: WFL   General Gait Details: Min guard initially, transitioning to supervision for safety. Verbal cuing for placement in RW, upright postures to maintain spinal precautions.   Stairs Stairs: Yes Stairs assistance: Min guard Stair Management: Two rails;Alternating pattern Number of Stairs: 5(5 total - 2 +3) General stair comments: Min guard for safety. Verbal cuing for step-to pattern of stair navigation, but pt using alternating pattern with mild unsteadiness.   Wheelchair Mobility    Modified Rankin (Stroke Patients Only)       Balance Overall balance assessment: Needs assistance Sitting-balance support: Feet supported;Bilateral upper extremity supported Sitting balance-Leahy Scale: Good     Standing balance support: During functional activity;Bilateral upper extremity supported Standing balance-Leahy  Scale: Poor Standing balance comment:  reliant on external support in standing                            Cognition Arousal/Alertness: Awake/alert Behavior During Therapy: Flat affect;Impulsive Overall Cognitive Status: Impaired/Different from baseline Area of Impairment: Memory                     Memory: Decreased recall of precautions   Safety/Judgement: Decreased awareness of safety;Decreased awareness of deficits   Problem Solving: Requires verbal cues;Requires tactile cues General Comments: Pt with decreased recall of precautions when asked at end of session, PT verbalized and demonstrated precautions x2 during session. Pt moves quickly and at times unsafely (i.e. twisting spine to look behind him, using RW to pull to standing), PT reinforced pt safety throughout mobility.      Exercises      General Comments        Pertinent Vitals/Pain Pain Assessment: 0-10 Pain Score: 5  Pain Location: neck Pain Descriptors / Indicators: Discomfort;Grimacing Pain Intervention(s): Limited activity within patient's tolerance;Monitored during session;Repositioned;Premedicated before session    Home Living                      Prior Function            PT Goals (current goals can now be found in the care plan section) Acute Rehab PT Goals Patient Stated Goal: to go home PT Goal Formulation: With patient Time For Goal Achievement: 05/02/19 Potential to Achieve Goals: Good Progress towards PT goals: Progressing toward goals    Frequency    Min 3X/week      PT Plan Discharge plan needs to be updated    Co-evaluation              AM-PAC PT "6 Clicks" Mobility   Outcome Measure  Help needed turning from your back to your side while in a flat bed without using bedrails?: None Help needed moving from lying on your back to sitting on the side of a flat bed without using bedrails?: None Help needed moving to and from a bed to a chair (including a wheelchair)?: A Little Help  needed standing up from a chair using your arms (e.g., wheelchair or bedside chair)?: A Little Help needed to walk in hospital room?: A Little Help needed climbing 3-5 steps with a railing? : A Little 6 Click Score: 20    End of Session Equipment Utilized During Treatment: Cervical collar;Gait belt Activity Tolerance: Patient tolerated treatment well Patient left: with call bell/phone within reach;in bed;with bed alarm set Nurse Communication: Mobility status PT Visit Diagnosis: Unsteadiness on feet (R26.81);Other abnormalities of gait and mobility (R26.89);Pain;Difficulty in walking, not elsewhere classified (R26.2) Pain - part of body: (neck)     Time: 1236-1300 PT Time Calculation (min) (ACUTE ONLY): 24 min  Charges:  $Gait Training: 8-22 mins $Therapeutic Activity: 8-22 mins                     Julien Girt, PT Acute Rehabilitation Services Pager [phone]  Office [phone]    Dakisha Schoof D Elonda Husky 04/21/2019, 1:38 PM

## 2019-04-22 LAB — CULTURE, BLOOD (ROUTINE X 2)
Culture: NO GROWTH
Culture: NO GROWTH
Special Requests: ADEQUATE

## 2019-07-11 ENCOUNTER — Ambulatory Visit: Payer: Self-pay | Admitting: Cardiovascular Disease

## 2019-12-26 ENCOUNTER — Encounter: Payer: Self-pay | Admitting: *Deleted

## 2019-12-26 ENCOUNTER — Other Ambulatory Visit: Payer: Self-pay

## 2019-12-26 DIAGNOSIS — Z981 Arthrodesis status: Secondary | ICD-10-CM

## 2019-12-26 DIAGNOSIS — Z20822 Contact with and (suspected) exposure to covid-19: Secondary | ICD-10-CM | POA: Diagnosis present

## 2019-12-26 DIAGNOSIS — Z888 Allergy status to other drugs, medicaments and biological substances status: Secondary | ICD-10-CM

## 2019-12-26 DIAGNOSIS — Z992 Dependence on renal dialysis: Secondary | ICD-10-CM

## 2019-12-26 DIAGNOSIS — K766 Portal hypertension: Secondary | ICD-10-CM | POA: Diagnosis present

## 2019-12-26 DIAGNOSIS — D735 Infarction of spleen: Principal | ICD-10-CM | POA: Diagnosis present

## 2019-12-26 DIAGNOSIS — I8510 Secondary esophageal varices without bleeding: Secondary | ICD-10-CM | POA: Diagnosis present

## 2019-12-26 DIAGNOSIS — K7460 Unspecified cirrhosis of liver: Secondary | ICD-10-CM | POA: Diagnosis present

## 2019-12-26 DIAGNOSIS — D631 Anemia in chronic kidney disease: Secondary | ICD-10-CM | POA: Diagnosis present

## 2019-12-26 DIAGNOSIS — I5042 Chronic combined systolic (congestive) and diastolic (congestive) heart failure: Secondary | ICD-10-CM | POA: Diagnosis present

## 2019-12-26 DIAGNOSIS — E785 Hyperlipidemia, unspecified: Secondary | ICD-10-CM | POA: Diagnosis present

## 2019-12-26 DIAGNOSIS — N2581 Secondary hyperparathyroidism of renal origin: Secondary | ICD-10-CM | POA: Diagnosis present

## 2019-12-26 DIAGNOSIS — R112 Nausea with vomiting, unspecified: Secondary | ICD-10-CM | POA: Diagnosis present

## 2019-12-26 DIAGNOSIS — N186 End stage renal disease: Secondary | ICD-10-CM | POA: Diagnosis present

## 2019-12-26 DIAGNOSIS — F17210 Nicotine dependence, cigarettes, uncomplicated: Secondary | ICD-10-CM | POA: Diagnosis present

## 2019-12-26 DIAGNOSIS — I132 Hypertensive heart and chronic kidney disease with heart failure and with stage 5 chronic kidney disease, or end stage renal disease: Secondary | ICD-10-CM | POA: Diagnosis present

## 2019-12-26 DIAGNOSIS — X58XXXA Exposure to other specified factors, initial encounter: Secondary | ICD-10-CM | POA: Diagnosis present

## 2019-12-26 DIAGNOSIS — Z841 Family history of disorders of kidney and ureter: Secondary | ICD-10-CM

## 2019-12-26 DIAGNOSIS — Z79899 Other long term (current) drug therapy: Secondary | ICD-10-CM

## 2019-12-26 DIAGNOSIS — I868 Varicose veins of other specified sites: Secondary | ICD-10-CM | POA: Diagnosis present

## 2019-12-26 DIAGNOSIS — I083 Combined rheumatic disorders of mitral, aortic and tricuspid valves: Secondary | ICD-10-CM | POA: Diagnosis present

## 2019-12-26 DIAGNOSIS — S2232XA Fracture of one rib, left side, initial encounter for closed fracture: Secondary | ICD-10-CM | POA: Diagnosis present

## 2019-12-26 LAB — CBC
HCT: 33.4 % — ABNORMAL LOW (ref 39.0–52.0)
Hemoglobin: 11.2 g/dL — ABNORMAL LOW (ref 13.0–17.0)
MCH: 32.3 pg (ref 26.0–34.0)
MCHC: 33.5 g/dL (ref 30.0–36.0)
MCV: 96.3 fL (ref 80.0–100.0)
Platelets: 126 10*3/uL — ABNORMAL LOW (ref 150–400)
RBC: 3.47 MIL/uL — ABNORMAL LOW (ref 4.22–5.81)
RDW: 14.4 % (ref 11.5–15.5)
WBC: 6.4 10*3/uL (ref 4.0–10.5)
nRBC: 0 % (ref 0.0–0.2)

## 2019-12-26 LAB — BASIC METABOLIC PANEL
Anion gap: 18 — ABNORMAL HIGH (ref 5–15)
BUN: 49 mg/dL — ABNORMAL HIGH (ref 6–20)
CO2: 26 mmol/L (ref 22–32)
Calcium: 7.7 mg/dL — ABNORMAL LOW (ref 8.9–10.3)
Chloride: 92 mmol/L — ABNORMAL LOW (ref 98–111)
Creatinine, Ser: 10.23 mg/dL — ABNORMAL HIGH (ref 0.61–1.24)
GFR calc Af Amer: 6 mL/min — ABNORMAL LOW (ref >60)
GFR calc non Af Amer: 5 mL/min — ABNORMAL LOW (ref >60)
Glucose, Bld: 99 mg/dL (ref 70–99)
Potassium: 4.8 mmol/L (ref 3.5–5.1)
Sodium: 136 mmol/L (ref 135–145)

## 2019-12-26 LAB — LIPASE, BLOOD: Lipase: 36 U/L (ref 11–51)

## 2019-12-26 NOTE — ED Notes (Signed)
Pt unable to void 

## 2019-12-26 NOTE — ED Triage Notes (Addendum)
Pt to triage via wheelchair.  Pt has left side abd pain.  Dialysis was 2 days ago.   Pt reports vomiting every morning.  No diarrhea.  Pt denies chest pain or sob.   Pt moans out.  pt alert  interpreter with pt.

## 2019-12-27 ENCOUNTER — Emergency Department: Payer: Self-pay

## 2019-12-27 ENCOUNTER — Inpatient Hospital Stay
Admission: EM | Admit: 2019-12-27 | Discharge: 2019-12-31 | DRG: 814 | Disposition: A | Discharge status: Home / Self Care | Payer: Self-pay | Attending: Internal Medicine | Admitting: Internal Medicine

## 2019-12-27 ENCOUNTER — Encounter: Payer: Self-pay | Admitting: Radiology

## 2019-12-27 DIAGNOSIS — N186 End stage renal disease: Secondary | ICD-10-CM

## 2019-12-27 DIAGNOSIS — Z992 Dependence on renal dialysis: Secondary | ICD-10-CM

## 2019-12-27 DIAGNOSIS — D735 Infarction of spleen: Secondary | ICD-10-CM | POA: Diagnosis present

## 2019-12-27 DIAGNOSIS — R188 Other ascites: Secondary | ICD-10-CM

## 2019-12-27 DIAGNOSIS — I5032 Chronic diastolic (congestive) heart failure: Secondary | ICD-10-CM

## 2019-12-27 DIAGNOSIS — Z720 Tobacco use: Secondary | ICD-10-CM

## 2019-12-27 DIAGNOSIS — I10 Essential (primary) hypertension: Secondary | ICD-10-CM

## 2019-12-27 DIAGNOSIS — D631 Anemia in chronic kidney disease: Secondary | ICD-10-CM

## 2019-12-27 DIAGNOSIS — R109 Unspecified abdominal pain: Secondary | ICD-10-CM

## 2019-12-27 DIAGNOSIS — R112 Nausea with vomiting, unspecified: Secondary | ICD-10-CM

## 2019-12-27 DIAGNOSIS — K7460 Unspecified cirrhosis of liver: Secondary | ICD-10-CM

## 2019-12-27 DIAGNOSIS — I749 Embolism and thrombosis of unspecified artery: Secondary | ICD-10-CM

## 2019-12-27 LAB — PROTIME-INR
INR: 1.3 — ABNORMAL HIGH (ref 0.8–1.2)
Prothrombin Time: 15.6 seconds — ABNORMAL HIGH (ref 11.4–15.2)

## 2019-12-27 LAB — CBC
HCT: 34 % — ABNORMAL LOW (ref 39.0–52.0)
Hemoglobin: 10.8 g/dL — ABNORMAL LOW (ref 13.0–17.0)
MCH: 31.5 pg (ref 26.0–34.0)
MCHC: 31.8 g/dL (ref 30.0–36.0)
MCV: 99.1 fL (ref 80.0–100.0)
Platelets: 111 10*3/uL — ABNORMAL LOW (ref 150–400)
RBC: 3.43 MIL/uL — ABNORMAL LOW (ref 4.22–5.81)
RDW: 14.4 % (ref 11.5–15.5)
WBC: 4.4 10*3/uL (ref 4.0–10.5)
nRBC: 0 % (ref 0.0–0.2)

## 2019-12-27 LAB — SARS CORONAVIRUS 2 BY RT PCR (HOSPITAL ORDER, PERFORMED IN ~~LOC~~ HOSPITAL LAB): SARS Coronavirus 2: NEGATIVE

## 2019-12-27 LAB — HEPATIC FUNCTION PANEL
ALT: 15 U/L (ref 0–44)
AST: 25 U/L (ref 15–41)
Albumin: 3.6 g/dL (ref 3.5–5.0)
Alkaline Phosphatase: 163 U/L — ABNORMAL HIGH (ref 38–126)
Bilirubin, Direct: 0.2 mg/dL (ref 0.0–0.2)
Indirect Bilirubin: 0.9 mg/dL (ref 0.3–0.9)
Total Bilirubin: 1.1 mg/dL (ref 0.3–1.2)
Total Protein: 7.1 g/dL (ref 6.5–8.1)

## 2019-12-27 LAB — ANTITHROMBIN III: AntiThromb III Func: 70 % — ABNORMAL LOW (ref 75–120)

## 2019-12-27 LAB — HEPATITIS B CORE ANTIBODY, TOTAL: Hep B Core Total Ab: NONREACTIVE

## 2019-12-27 LAB — AMMONIA: Ammonia: 39 umol/L — ABNORMAL HIGH (ref 9–35)

## 2019-12-27 LAB — HEPATITIS C ANTIBODY: HCV Ab: NONREACTIVE

## 2019-12-27 LAB — HEPARIN LEVEL (UNFRACTIONATED)
Heparin Unfractionated: 0.38 IU/mL (ref 0.30–0.70)
Heparin Unfractionated: 0.44 IU/mL (ref 0.30–0.70)

## 2019-12-27 LAB — APTT: aPTT: 155 seconds — ABNORMAL HIGH (ref 24–36)

## 2019-12-27 LAB — HEPATITIS B SURFACE ANTIBODY,QUALITATIVE: Hep B S Ab: REACTIVE — AB

## 2019-12-27 LAB — PHOSPHORUS: Phosphorus: 6.5 mg/dL — ABNORMAL HIGH (ref 2.5–4.6)

## 2019-12-27 LAB — HEPATITIS B SURFACE ANTIGEN: Hepatitis B Surface Ag: NONREACTIVE

## 2019-12-27 MED ORDER — IOHEXOL 300 MG/ML  SOLN
100.0000 mL | Freq: Once | INTRAMUSCULAR | Status: AC | PRN
  Administered 2019-12-27: 100 mL via INTRAVENOUS

## 2019-12-27 MED ORDER — HYDRALAZINE HCL 20 MG/ML IJ SOLN
10.0000 mg | INTRAMUSCULAR | Status: DC | PRN
  Administered 2019-12-28: 10 mg via INTRAVENOUS
  Filled 2019-12-27 (×2): qty 1

## 2019-12-27 MED ORDER — ONDANSETRON HCL 4 MG/2ML IJ SOLN
4.0000 mg | Freq: Once | INTRAMUSCULAR | Status: AC
  Administered 2019-12-27: 4 mg via INTRAVENOUS
  Filled 2019-12-27: qty 2

## 2019-12-27 MED ORDER — HEPARIN (PORCINE) 25000 UT/250ML-% IV SOLN
1200.0000 [IU]/h | INTRAVENOUS | Status: DC
  Administered 2019-12-27 – 2019-12-30 (×3): 1050 [IU]/h via INTRAVENOUS
  Administered 2019-12-31: 1200 [IU]/h via INTRAVENOUS
  Filled 2019-12-27 (×6): qty 250

## 2019-12-27 MED ORDER — LISINOPRIL 20 MG PO TABS
20.0000 mg | ORAL_TABLET | Freq: Every day | ORAL | Status: DC
  Administered 2019-12-27 – 2019-12-29 (×3): 20 mg via ORAL
  Filled 2019-12-27: qty 2
  Filled 2019-12-27: qty 1
  Filled 2019-12-27: qty 2

## 2019-12-27 MED ORDER — MORPHINE SULFATE (PF) 2 MG/ML IV SOLN
1.0000 mg | INTRAVENOUS | Status: DC | PRN
  Administered 2019-12-27 – 2019-12-31 (×7): 1 mg via INTRAVENOUS
  Filled 2019-12-27 (×7): qty 1

## 2019-12-27 MED ORDER — IOHEXOL 9 MG/ML PO SOLN
500.0000 mL | Freq: Two times a day (BID) | ORAL | Status: DC | PRN
  Administered 2019-12-27: 500 mL via ORAL
  Filled 2019-12-27: qty 500

## 2019-12-27 MED ORDER — MORPHINE SULFATE (PF) 2 MG/ML IV SOLN: 2.0000 mg | INTRAVENOUS | Status: DC | PRN

## 2019-12-27 MED ORDER — ALBUTEROL SULFATE (2.5 MG/3ML) 0.083% IN NEBU: 2.5000 mg | INHALATION_SOLUTION | RESPIRATORY_TRACT | Status: DC | PRN

## 2019-12-27 MED ORDER — CHLORHEXIDINE GLUCONATE CLOTH 2 % EX PADS
6.0000 | MEDICATED_PAD | Freq: Every day | CUTANEOUS | Status: DC
  Administered 2019-12-30 – 2019-12-31 (×2): 6 via TOPICAL
  Filled 2019-12-27 (×3): qty 6

## 2019-12-27 MED ORDER — HEPARIN SODIUM (PORCINE) 5000 UNIT/ML IJ SOLN
60.0000 [IU]/kg | Freq: Once | INTRAMUSCULAR | Status: AC
  Administered 2019-12-27: 3900 [IU] via INTRAVENOUS

## 2019-12-27 MED ORDER — MORPHINE SULFATE (PF) 4 MG/ML IV SOLN
4.0000 mg | Freq: Once | INTRAVENOUS | Status: AC
  Administered 2019-12-27: 4 mg via INTRAVENOUS
  Filled 2019-12-27: qty 1

## 2019-12-27 MED ORDER — FERRIC CITRATE 1 GM 210 MG(FE) PO TABS
420.0000 mg | ORAL_TABLET | Freq: Three times a day (TID) | ORAL | Status: DC
  Administered 2019-12-28 – 2019-12-31 (×8): 420 mg via ORAL
  Filled 2019-12-27 (×12): qty 2

## 2019-12-27 MED ORDER — HYDRALAZINE HCL 20 MG/ML IJ SOLN
5.0000 mg | INTRAMUSCULAR | Status: DC | PRN
  Administered 2019-12-27: 5 mg via INTRAVENOUS
  Filled 2019-12-27: qty 1

## 2019-12-27 MED ORDER — SODIUM CHLORIDE 0.9 % IV BOLUS
500.0000 mL | Freq: Once | INTRAVENOUS | Status: AC
  Administered 2019-12-27: 500 mL via INTRAVENOUS

## 2019-12-27 MED ORDER — NICOTINE 21 MG/24HR TD PT24
21.0000 mg | MEDICATED_PATCH | Freq: Every day | TRANSDERMAL | Status: DC
  Administered 2019-12-27 – 2019-12-31 (×3): 21 mg via TRANSDERMAL
  Filled 2019-12-27 (×5): qty 1

## 2019-12-27 MED ORDER — HYDROXYZINE HCL 25 MG PO TABS
25.0000 mg | ORAL_TABLET | Freq: Four times a day (QID) | ORAL | Status: DC | PRN
  Administered 2019-12-28 – 2019-12-31 (×3): 25 mg via ORAL
  Filled 2019-12-27 (×3): qty 1

## 2019-12-27 MED ORDER — OXYCODONE HCL 5 MG PO TABS
5.0000 mg | ORAL_TABLET | Freq: Four times a day (QID) | ORAL | Status: DC | PRN
  Administered 2019-12-28 – 2019-12-31 (×6): 5 mg via ORAL
  Filled 2019-12-27 (×8): qty 1

## 2019-12-27 MED ORDER — DM-GUAIFENESIN ER 30-600 MG PO TB12: 1.0000 | ORAL_TABLET | Freq: Two times a day (BID) | ORAL | Status: DC | PRN

## 2019-12-27 NOTE — Progress Notes (Signed)
HD started. 

## 2019-12-27 NOTE — Progress Notes (Signed)
ANTICOAGULATION CONSULT NOTE - Initial Consult  Pharmacy Consult for Heparin Indication: splenic infarct  Allergies  Allergen Reactions  . Betadine [Povidone Iodine] Rash    Patient Measurements: Height: 5\' 5"  (165.1 cm) Weight: 65 kg (143 lb 4.8 oz) IBW/kg (Calculated) : 61.5 HEPARIN DW (KG): 65  Vital Signs: Temp: 97.9 F (36.6 C) (05/25 1230) Temp Source: Oral (05/25 1230) BP: 146/72 (05/25 1400) Pulse Rate: 70 (05/25 1400)  Labs: Recent Labs    12/26/19 2109 12/27/19 0245 12/27/19 1112  HGB 11.2*  --   --   HCT 33.4*  --   --   PLT 126*  --   --   APTT  --   --  155*  LABPROT  --  15.6*  --   INR  --  1.3*  --   HEPARINUNFRC  --   --  0.38  CREATININE 10.23*  --   --     Estimated Creatinine Clearance: 7.6 mL/min (A) (by C-G formula based on SCr of 10.23 mg/dL (H)).   Medical History: Past Medical History:  Diagnosis Date  . Abdominal pain 08/28/2016  . Acute hyperkalemia 10/28/2016  . Chest pain 04/11/2016  . Chronic combined systolic and diastolic CHF (congestive heart failure) ([place])   . Cirrhosis ([place])   . Dialysis patient ([place])   . ESRD (end stage renal disease) on dialysis ([place]) 01/25/2015  . Fluid overload 04/12/2016  . HTN (hypertension) 10/13/2016  . Hyperkalemia 11/27/2016  . Hypertension   . Mitral regurgitation   . Pulmonary edema 07/03/2016  . Renal disorder   . Renal insufficiency   . Scrotal infection 01/25/2015    Medications:  Medications Prior to Admission  Medication Sig Dispense Refill Last Dose  . ferric citrate (AURYXIA) 1 GM 210 MG(Fe) tablet Take 420 mg by mouth 3 (three) times daily with meals.    12/24/2019 at Unknown  . acetaminophen (TYLENOL) 500 MG tablet Take 500 mg by mouth every 6 (six) hours as needed for mild pain.   Not Taking at Unknown time  . amLODipine (NORVASC) 10 MG tablet Take 1 tablet (10 mg total) by mouth daily. (Patient not taking: Reported on 12/27/2019) 30 tablet 0 Not Taking at Unknown time  . cyclobenzaprine  (FLEXERIL) 10 MG tablet Take 1 tablet (10 mg total) by mouth 3 (three) times daily. (Patient not taking: Reported on 12/27/2019) 30 tablet 0 Not Taking at Unknown time  . lisinopril (ZESTRIL) 20 MG tablet Take 1 tablet (20 mg total) by mouth daily. 30 tablet 0   . oxyCODONE (OXY IR/ROXICODONE) 5 MG immediate release tablet Take 1 tablet (5 mg total) by mouth every 6 (six) hours as needed for up to 15 doses for moderate pain. (Patient not taking: Reported on 12/27/2019) 15 tablet 0 Not Taking at Unknown time    Assessment: Heparin initiated by EDP.  No anticoagulants per PTA med list.  Baseline labs ordered.  Pharmacy asked to follow and manage.  5/25 Heparin infusion stared @ 1050 units/hr.   Goal of Therapy:  Heparin level 0.3-0.7 units/ml Monitor platelets by anticoagulation protocol: Yes   Plan:  05/25 @ 1112 HL: 0.38. Level is therapeutic x 1. Continue current infusion rate of  1050 units/hr. Recheck confirmatory HL in 6 hours.  Monitor CBC daily while on heparin infusion.   Pernell Dupre, PharmD, BCPS Clinical Pharmacist 12/27/2019 2:17 PM

## 2019-12-27 NOTE — Progress Notes (Signed)
ANTICOAGULATION CONSULT NOTE - Initial Consult  Pharmacy Consult for Heparin Indication: splenic infarct  Allergies  Allergen Reactions  . Betadine [Povidone Iodine] Rash    Patient Measurements: Height: 5\' 5"  (165.1 cm) Weight: 65 kg (143 lb 4.8 oz) IBW/kg (Calculated) : 61.5 HEPARIN DW (KG): 65  Vital Signs: Temp: 98.5 F (36.9 C) (05/24 2110) Temp Source: Oral (05/24 2110) BP: 169/82 (05/25 0437) Pulse Rate: 72 (05/25 0437)  Labs: Recent Labs    12/26/19 2109 12/27/19 0245  HGB 11.2*  --   HCT 33.4*  --   PLT 126*  --   LABPROT  --  15.6*  INR  --  1.3*  CREATININE 10.23*  --     Estimated Creatinine Clearance: 7.6 mL/min (A) (by C-G formula based on SCr of 10.23 mg/dL (H)).   Medical History: Past Medical History:  Diagnosis Date  . Abdominal pain 08/28/2016  . Acute hyperkalemia 10/28/2016  . Chest pain 04/11/2016  . Chronic combined systolic and diastolic CHF (congestive heart failure) ([place])   . Cirrhosis ([place])   . Dialysis patient ([place])   . ESRD (end stage renal disease) on dialysis ([employer]) 01/25/2015  . Fluid overload 04/12/2016  . HTN (hypertension) 10/13/2016  . Hyperkalemia 11/27/2016  . Hypertension   . Mitral regurgitation   . Pulmonary edema 07/03/2016  . Renal disorder   . Renal insufficiency   . Scrotal infection 01/25/2015    Medications:  (Not in a hospital admission)   Assessment: Heparin initiated by EDP.  No anticoagulants per PTA med list.  Baseline labs ordered.  Pharmacy asked to follow and manage.  Goal of Therapy:  Heparin level 0.3-0.7 units/ml Monitor platelets by anticoagulation protocol: Yes   Plan:  Heparin bolus of 3900 units followed by infusion of 1050 units/hr ordered by EDP Will check heparin level 8 hours after heparin started and adjust per protocol  Hart Robinsons A 12/27/2019,4:57 AM

## 2019-12-27 NOTE — H&P (Signed)
History and Physical    [address] [name] DOB: 05-Oct-1970 DOA: 12/27/2019  Referring MD/NP/PA:   PCP: Mario Legato, MD   Patient coming from:  The patient is coming from home.  At baseline, pt is independent for most of ADL.        Chief Complaint: Left upper quadrant abdominal pain, left flank pain  HPI: Mario [name] is a 49 y.o. male with medical history significant of hypertension, ESRD-HD (TTS), liver cirrhosis, SBP, CHF, anemia, tobacco abuse, mitral valve prolapse, who presents with left upper quadrant abdominal pain and left flank pain.  Patient states that he has been having pain in the left upper quadrant and left flank area, which has been going on for 5 days.  Associated with nausea and vomiting.  He has had a few episodes of nonbloody nonbilious vomiting.  No diarrhea.  Patient does not have chest pain, cough, shortness breath.  Patient reports subjective fever and chills. His body temperature is normal in the ED. No symptoms of UTI or unilateral weakness.  Patient denies recent fall or head injury.  No dark stool.  ED Course: pt was found to have WBC 6.4, INR 1.3, pending COVID-19 PCR, potassium 4.8, bicarbonate 26, creatinine 10.23, BUN 49, temperature normal, blood pressure 147/70, heart rate 68, RR 16, oxygen saturation 96% on room air.  CT scan showed splenic infarct.  Patient is admitted to [place] bed as inpatient  CT of abdomen/pelvis showed: 1. Wedge-shaped regions of hypoattenuation in the central spleen and anterior spleen have an appearance suggestive of splenic infarct. 2. Stigmata of cirrhosis and portal hypertension with a markedly nodular and dysplastic appearing liver, extensive venous collateralization through the abdomen, splenomegaly, and moderate volume ascites. 3. Venous collaterals including a recanalized umbilical vein, gastric and esophageal varices, and rectal varices. 4. Calcification in the hepatic vein could reflect  sequela of remote or chronic thrombosis, poorly opacified on this exam. 5. There is hypodense filling defect extending from the left lower extremity from the femoral and greater saphenous veins into the common femoral, iliac and IVC which extends to the confluence of the renal arteries. 6. Diffuse mild edematous thickening of the small bowel likely related to the moderate volume ascites and/or portal enteropathy. 7. Diffuse bladder wall thickening, possibly related to intrinsic liver disease. Recommend correlation with urinalysis to exclude cystitis. 8. Chronic osseous manifestations of hyperparathyroidism/renal osteodystrophy. 9. Prior changes of bilateral sacroiliitis which are progressive from prior studies. Additional erosive changes along the left symphysis pubis are similar to the comparison. 10. Chronic left basilar thickening calcified pleural plaque with adjacent rounded atelectasis in the left lower lobe, similar to comparison in 2019. 11. Aortic Atherosclerosis (ICD10-I70.0).  Review of Systems:   General: has subjective fevers, chills, no body weight gain, has poor appetite, has fatigue HEENT: no blurry vision, hearing changes or sore throat Respiratory: no dyspnea, coughing, wheezing CV: no chest pain, no palpitations GI: has nausea, vomiting, abdominal pain, no diarrhea, constipation GU: no dysuria, burning on urination, increased urinary frequency, hematuria  Ext: no leg edema Neuro: no unilateral weakness, numbness, or tingling, no vision change or hearing loss Skin: no rash, no skin tear. MSK: No muscle spasm, no deformity, no limitation of range of movement in spin Heme: No easy bruising.  Travel history: No recent long distant travel.  Allergy:  Allergies  Allergen Reactions  . Betadine [Povidone Iodine] Rash    Past Medical History:  Diagnosis Date  . Abdominal pain 08/28/2016  .  Acute hyperkalemia 10/28/2016  . Chest pain 04/11/2016  . Chronic combined systolic and  diastolic CHF (congestive heart failure) ([place])   . Cirrhosis ([place])   . Dialysis patient ([place])   . ESRD (end stage renal disease) on dialysis ([place]) 01/25/2015  . Fluid overload 04/12/2016  . HTN (hypertension) 10/13/2016  . Hyperkalemia 11/27/2016  . Hypertension   . Mitral regurgitation   . Pulmonary edema 07/03/2016  . Renal disorder   . Renal insufficiency   . Scrotal infection 01/25/2015    Past Surgical History:  Procedure Laterality Date  . ANTERIOR CERVICAL CORPECTOMY N/A 04/05/2019   Procedure: ANTERIOR CERVICAL CORPECTOMY Cervical six and Cervical seven with strut graft fusion from Cervical five-Thoracic one   ;  Surgeon: Earnie Larsson, MD;  Location: [facility];  Service: Neurosurgery;  Laterality: N/A;  . AV FISTULA PLACEMENT    . POSTERIOR CERVICAL FUSION/FORAMINOTOMY N/A 04/05/2019   Procedure: Posterior cervical fusion utilizing segmental instrumentation from Cervical five-Thoracic one ;  Surgeon: Earnie Larsson, MD;  Location: [facility];  Service: Neurosurgery;  Laterality: N/A;    Social History:  reports that he has been smoking cigarettes. He has a 6.25 pack-year smoking history. He has never used smokeless tobacco. He reports that he does not drink alcohol or use drugs.  Family History:  Family History  Problem Relation Age of Onset  . Kidney failure Father   . Diabetes Mother      Prior to Admission medications   Medication Sig Start Date End Date Taking? Authorizing Provider  ferric citrate (AURYXIA) 1 GM 210 MG(Fe) tablet Take 420 mg by mouth 3 (three) times daily with meals.    Yes [provider]  acetaminophen (TYLENOL) 500 MG tablet Take 500 mg by mouth every 6 (six) hours as needed for mild pain.    [provider]  amLODipine (NORVASC) 10 MG tablet Take 1 tablet (10 mg total) by mouth daily. Patient not taking: Reported on 12/27/2019 04/14/19 05/14/19  Darliss Cheney, MD  cyclobenzaprine (FLEXERIL) 10 MG tablet Take 1 tablet (10 mg total) by mouth 3 (three)  times daily. Patient not taking: Reported on 12/27/2019 04/13/19   Darliss Cheney, MD  lisinopril (ZESTRIL) 20 MG tablet Take 1 tablet (20 mg total) by mouth daily. 04/14/19 05/14/19  Darliss Cheney, MD  oxyCODONE (OXY IR/ROXICODONE) 5 MG immediate release tablet Take 1 tablet (5 mg total) by mouth every 6 (six) hours as needed for up to 15 doses for moderate pain. Patient not taking: Reported on 12/27/2019 04/13/19   Darliss Cheney, MD    Physical Exam: Vitals:   12/27/19 0338 12/27/19 0437 12/27/19 0548 12/27/19 0700  BP: (!) 171/80 (!) 169/82 (!) 161/76 (!) 147/70  Pulse: 72 72 70 68  Resp: 17 18 17 16   Temp:      TempSrc:      SpO2: 96% 96% 96% 96%  Weight:      Height:       General: Not in acute distress HEENT:       Eyes: PERRL, EOMI, no scleral icterus.       ENT: No discharge from the ears and nose, no pharynx injection, no tonsillar enlargement.        Neck: No JVD, no bruit, no mass felt. Heme: No neck lymph node enlargement. Cardiac: S1/S2, RRR, No gallops or rubs. Respiratory: No rales, wheezing, rhonchi or rubs. GI: Soft, nondistended, has tenderness in LUQ, no rebound pain, no organomegaly, BS present. GU: No hematuria Ext: No  pitting leg edema bilaterally. 2+DP/PT pulse bilaterally. Musculoskeletal: No joint deformities, No joint redness or warmth, no limitation of ROM in spin. Skin: No rashes.  Neuro: Alert, oriented X3, cranial nerves II-XII grossly intact, moves all extremities normally. Psych: Patient is not psychotic, no suicidal or hemocidal ideation.  Labs on Admission: I have personally reviewed following labs and imaging studies  CBC: Recent Labs  Lab 12/26/19 2109  WBC 6.4  HGB 11.2*  HCT 33.4*  MCV 96.3  PLT 505*   Basic Metabolic Panel: Recent Labs  Lab 12/26/19 2109  NA 136  K 4.8  CL 92*  CO2 26  GLUCOSE 99  BUN 49*  CREATININE 10.23*  CALCIUM 7.7*   GFR: Estimated Creatinine Clearance: 7.6 mL/min (A) (by C-G formula based on SCr of  10.23 mg/dL (H)). Liver Function Tests: Recent Labs  Lab 12/27/19 0245  AST 25  ALT 15  ALKPHOS 163*  BILITOT 1.1  PROT 7.1  ALBUMIN 3.6   Recent Labs  Lab 12/26/19 2109  LIPASE 36   No results for input(s): AMMONIA in the last 168 hours. Coagulation Profile: Recent Labs  Lab 12/27/19 0245  INR 1.3*   Cardiac Enzymes: No results for input(s): CKTOTAL, CKMB, CKMBINDEX, TROPONINI in the last 168 hours. BNP (last 3 results) No results for input(s): PROBNP in the last 8760 hours. HbA1C: No results for input(s): HGBA1C in the last 72 hours. CBG: No results for input(s): GLUCAP in the last 168 hours. Lipid Profile: No results for input(s): CHOL, HDL, LDLCALC, TRIG, CHOLHDL, LDLDIRECT in the last 72 hours. Thyroid Function Tests: No results for input(s): TSH, T4TOTAL, FREET4, T3FREE, THYROIDAB in the last 72 hours. Anemia Panel: No results for input(s): VITAMINB12, FOLATE, FERRITIN, TIBC, IRON, RETICCTPCT in the last 72 hours. Urine analysis: No results found for: COLORURINE, APPEARANCEUR, LABSPEC, PHURINE, GLUCOSEU, HGBUR, BILIRUBINUR, KETONESUR, PROTEINUR, UROBILINOGEN, NITRITE, LEUKOCYTESUR Sepsis Labs: @LABRCNTIP (procalcitonin:4,lacticidven:4) )No results found for this or any previous visit (from the past 240 hour(s)).   Radiological Exams on Admission: DG Chest 2 View  Result Date: 12/27/2019 CLINICAL DATA:  Left upper quadrant pain. Vomiting. EXAM: CHEST - 2 VIEW COMPARISON:  Chest radiograph 03/24/2019 FINDINGS: Unchanged heart size and mediastinal contours. Peribronchial thickening. Suspected small left pleural effusion. Subsegmental atelectasis at the bases. No pneumothorax. Surgical hardware in the cervical spine is partially included. IMPRESSION: 1. Mild cardiomegaly. Peribronchial thickening may be bronchitic or congestive. 2. Suspected small left pleural effusion. Electronically Signed   By: Keith Rake M.D.   On: 12/27/2019 02:09   CT Abdomen Pelvis W  Contrast  Addendum Date: 12/27/2019   ADDENDUM REPORT: 12/27/2019 03:58 ADDENDUM: These results were called by telephone on 12/27/2019 at 3:58 am to provider JADE SUNG , who verbally acknowledged these results. Electronically Signed   By: Lovena Le M.D.   On: 12/27/2019 03:58   Result Date: 12/27/2019 CLINICAL DATA:  Nausea vomiting, left-sided abdominal pain, recent dialysis 2 days prior EXAM: CT ABDOMEN AND PELVIS WITH CONTRAST TECHNIQUE: Multidetector CT imaging of the abdomen and pelvis was performed using the standard protocol following bolus administration of intravenous contrast. CONTRAST:  12mL OMNIPAQUE IOHEXOL 300 MG/ML  SOLN COMPARISON:  CT 05/01/2018 FINDINGS: Lower chest: Dependent atelectatic changes noted in the lung bases. Chronic left basilar thickening calcified pleural plaque with adjacent rounded atelectasis in the left lower lobe, similar to comparison exam. No new consolidative opacity. Mild cardiomegaly. Dense calcification of the mitral annulus and upon the chordae tendinae. Apical thinning of the left ventricle  with calcification may reflect sequela of prior infarct. Coronary artery calcifications are present. No pericardial effusion. Hepatobiliary: There is a diffusely did dysplastic appearance throughout the liver with a markedly nodular liver surface contour, similar to comparison exams. This likely reflect sequela of chronic fibrotic changes of cirrhosis. No focal liver lesion. Mild gallbladder wall thickening is nonspecific in the setting of intrinsic liver disease. No visible calcified gallstones or biliary ductal dilatation. Pancreas: Unremarkable. No pancreatic ductal dilatation or surrounding inflammatory changes. Spleen: Wedge-shaped regions of hypoattenuation in the central spleen and anterior spleen (2/39 for example) have an appearance suggestive of splenic infarct. Splenomegaly is noted. Adrenals/Urinary Tract: Normal adrenal glands. Diffusely atrophic and dysplastic  appearance of the kidneys with multiple small cysts of mixed attenuation many of which are unchanged from comparison in 2019. Findings are compatible with chronic dialysis dependence. No hydronephrosis or urolithiasis. No new concerning renal lesion. Urinary bladder is largely decompressed at the time of exam and therefore poorly evaluated by CT imaging. Diffuse bladder wall thickening is noted. A calcification seen along the and anterior bladder wall is unchanged prior. Stomach/Bowel: Extensive paraesophageal varices. Gastric varices are more mild. Diffuse mild edematous thickening of the small bowel likely related to the moderate volume ascites and or portal enteropathy. A normal appendix is visualized. No colonic dilatation or wall thickening. Moderate stool burden. Minimal rectal wall thickening with extensive perirectal varices as well. Vascular/Lymphatic: Extensive aortoiliac atherosclerosis. No aneurysm or ectasia. The hepatic veins are poorly opacified. Dense calcification in the hepatic veins may suggest some chronic thrombosis. There is recanalization of the umbilical vein as well as caput medusa across the anterior abdomen. There are extensive upper abdominal venous collaterals including esophageal and gastric varices, as above. Evidence of splenorenal shunting is noted. The portal vein and splenic veins are patent. Question a portal venous stent. There is notable hypodense expansile filling defect extending from the left common femoral vein and greater saphenous vein into the iliac vein and IVC which extends to the level of the confluence with the renal veins. Reproductive: Prostate is unremarkable. Few adjacent varices of the right gonadal vein. Other: Moderate volume ascites. No bowel containing hernia. No abdominopelvic free air, pneumatosis or portal venous gas. Musculoskeletal: Chronic osseous manifestations of hyperparathyroidism/renal osteodystrophy. Prior changes of bilateral sacroiliitis which  are progressive from prior studies. Additional erosive changes along the left symphysis pubis are similar to the comparison. No new concerning osseous lesions. IMPRESSION: 1. Wedge-shaped regions of hypoattenuation in the central spleen and anterior spleen have an appearance suggestive of splenic infarct. 2. Stigmata of cirrhosis and portal hypertension with a markedly nodular and dysplastic appearing liver, extensive venous collateralization through the abdomen, splenomegaly, and moderate volume ascites. 3. Venous collaterals including a recanalized umbilical vein, gastric and esophageal varices, and rectal varices. 4. Calcification in the hepatic vein could reflect sequela of remote or chronic thrombosis, poorly opacified on this exam. 5. There is hypodense filling defect extending from the left lower extremity from the femoral and greater saphenous veins into the common femoral, iliac and IVC which extends to the confluence of the renal arteries. 6. Diffuse mild edematous thickening of the small bowel likely related to the moderate volume ascites and/or portal enteropathy. 7. Diffuse bladder wall thickening, possibly related to intrinsic liver disease. Recommend correlation with urinalysis to exclude cystitis. 8. Chronic osseous manifestations of hyperparathyroidism/renal osteodystrophy. 9. Prior changes of bilateral sacroiliitis which are progressive from prior studies. Additional erosive changes along the left symphysis pubis are similar to the  comparison. 10. Chronic left basilar thickening calcified pleural plaque with adjacent rounded atelectasis in the left lower lobe, similar to comparison in 2019. 11. Aortic Atherosclerosis (ICD10-I70.0). Currently attempting to contact the ordering provider with a critical value result. Addendum will be submitted upon case discussion. Electronically Signed: By: Lovena Le M.D. On: 12/27/2019 03:53     EKG: Independently reviewed.  Sinus rhythm, QTC 502, LAE,  LAD.  Assessment/Plan Principal Problem:   Splenic infarction Active Problems:   ESRD (end stage renal disease) on dialysis (HCC)   Chronic diastolic (congestive) heart failure (HCC)   Cirrhosis (HCC)   HTN (hypertension)   Tobacco abuse   Anemia in ESRD (end-stage renal disease) (HCC)   Splenic infarction: Vascular surgery, Dr. Delana Meyer is consulted.  -Admitted to MedSurg bed as inpatient -Continue IV heparin -Pain control: As needed oxycodone and morphine -Check hyercoagulable panel -Monitor any signs of spleen rupture -Follow-up vascular surgeon's recommendation  ESRD (end stage renal disease) on dialysis (TTS) -Dr. Candiss Norse of renal is consulted  Chronic diastolic (congestive) heart failure ([place]): 2D echo on 04/03/2019 showed EF of 60-65%.  Patient does not have leg edema.  No respiratory distress.  CHF seem to be compensated. -Volume management per renal by dialysis.  Lvie Cirrhosis ([place]): Mental status normal. -check INR and PTT -check ammonia level  HTN:  -Continue home medications: Amlodipine and lisinopril -hydralazine prn  Tobacco abuse -nicotine patch  Anemia in ESRD (end-stage renal disease) ([place]): Hgb stable, 11.2 -f/u by CBC    DVT ppx: on IV Heparin    Code Status: Full code Family Communication: not done, no family member is at bed side.    Disposition Plan:  Anticipate discharge back to previous environment Consults called:  Dr. Candiss Norse of renal and Dr. Delana Meyer of [facility] are consulted. Admission status: Med-surg bed as inpt         Status is: Inpatient  Remains inpatient appropriate because:Inpatient level of care appropriate due to severity of illness patient has multiple comorbidities, now presents with a splenic infarct.  Patient's presentation is highly complicated.  Patient is at high risk of developing complications, such as splenic rupture.  Patient needs to be treated and monitored closely in hospital for at least 2 days.  Dispo: The patient  is from: Home              Anticipated d/c is to: Home              Anticipated d/c date is: 2 days              Patient currently is not medically stable to d/c.           Date of Service 12/27/2019    Ivor Costa [facility]   If 7PM-7AM, please contact night-coverage [email or web address] 12/27/2019, 8:08 AM

## 2019-12-27 NOTE — Progress Notes (Signed)
PRE HD   

## 2019-12-27 NOTE — Progress Notes (Signed)
HD ended 

## 2019-12-27 NOTE — Progress Notes (Signed)
[facility], [place] 12/27/19  Subjective:   LOS: 0 Patient presented to the emergency room for left flank pain and abdominal pain.  States pain has been for about 3 to 5 days. No fevers or chills No leg edema Blood pressure150s to 160s Appetite has been poor Conversation through video Spanish interpreter  Objective:  Vital signs in last 24 hours:  Temp:  [97.8 F (36.6 C)-98.5 F (36.9 C)] 97.8 F (36.6 C) (05/25 1608) Pulse Rate:  [60-78] 64 (05/25 1615) Resp:  [13-24] 23 (05/25 1615) BP: (124-171)/(64-88) 150/77 (05/25 1615) SpO2:  [94 %-100 %] 99 % (05/25 1615) Weight:  [65 kg] 65 kg (05/25 1230)  Weight change:  Filed Weights   12/26/19 2107 12/27/19 1230  Weight: 65 kg 65 kg    Intake/Output:    Intake/Output Summary (Last 24 hours) at 12/27/2019 1647 Last data filed at 12/27/2019 1608 Gross per 24 hour  Intake 500 ml  Output 500 ml  Net 0 ml     Physical Exam: General:  Chronically ill-appearing, laying in the bed  HEENT  moist oral mucous membrane  Pulm/lungs  normal breathing effort on room air  CVS/Heart  no rub or gallop  Abdomen:   Soft, mild left abdominal tenderness  Extremities:  Trace edema  Neurologic:  Alert, able to follow commands  Skin:  Warm, dry  Access:  Left arm aneurysmal fistula       Basic Metabolic Panel:  Recent Labs  Lab 12/26/19 2109 12/27/19 1253  NA 136  --   K 4.8  --   CL 92*  --   CO2 26  --   GLUCOSE 99  --   BUN 49*  --   CREATININE 10.23*  --   CALCIUM 7.7*  --   PHOS  --  6.5*     CBC: Recent Labs  Lab 12/26/19 2109  WBC 6.4  HGB 11.2*  HCT 33.4*  MCV 96.3  PLT 126*      Lab Results  Component Value Date   HEPBSAG Negative 04/10/2019   HEPBIGM Negative 05/02/2018      Microbiology:  Recent Results (from the past 240 hour(s))  SARS Coronavirus 2 by RT PCR (hospital order, performed in [place] hospital lab) Nasopharyngeal Nasopharyngeal Swab     Status:  None   Collection Time: 12/27/19  4:38 AM   Specimen: Nasopharyngeal Swab  Result Value Ref Range Status   SARS Coronavirus 2 NEGATIVE NEGATIVE Final    Comment: (NOTE) SARS-CoV-2 target nucleic acids are NOT DETECTED. The SARS-CoV-2 RNA is generally detectable in upper and lower respiratory specimens during the acute phase of infection. The lowest concentration of SARS-CoV-2 viral copies this assay can detect is 250 copies / mL. A negative result does not preclude SARS-CoV-2 infection and should not be used as the sole basis for treatment or other patient management decisions.  A negative result may occur with improper specimen collection / handling, submission of specimen other than nasopharyngeal swab, presence of viral mutation(s) within the areas targeted by this assay, and inadequate number of viral copies (<250 copies / mL). A negative result must be combined with clinical observations, patient history, and epidemiological information. Fact Sheet for Patients:   StrictlyIdeas.no Fact Sheet for Healthcare Providers: BankingDealers.co.za This test is not yet approved or cleared  by the [place] FDA and has been authorized for detection and/or diagnosis of SARS-CoV-2 by FDA under an Emergency Use Authorization (EUA).  This EUA  will remain in effect (meaning this test can be used) for the duration of the COVID-19 declaration under Section 564(b)(1) of the Act, 21 U.S.C. section 360bbb-3(b)(1), unless the authorization is terminated or revoked sooner. Performed at [facility], [address] [place], [place] [address]     Coagulation Studies: Recent Labs    12/27/19 0245  LABPROT 15.6*  INR 1.3*    Urinalysis: No results for input(s): COLORURINE, LABSPEC, PHURINE, GLUCOSEU, HGBUR, BILIRUBINUR, KETONESUR, PROTEINUR, UROBILINOGEN, NITRITE, LEUKOCYTESUR in the last 72 hours.  Invalid input(s): APPERANCEUR     Imaging: DG Chest 2 View  Result Date: 12/27/2019 CLINICAL DATA:  Left upper quadrant pain. Vomiting. EXAM: CHEST - 2 VIEW COMPARISON:  Chest radiograph 03/24/2019 FINDINGS: Unchanged heart size and mediastinal contours. Peribronchial thickening. Suspected small left pleural effusion. Subsegmental atelectasis at the bases. No pneumothorax. Surgical hardware in the cervical spine is partially included. IMPRESSION: 1. Mild cardiomegaly. Peribronchial thickening may be bronchitic or congestive. 2. Suspected small left pleural effusion. Electronically Signed   By: Keith Rake M.D.   On: 12/27/2019 02:09   CT Abdomen Pelvis W Contrast  Addendum Date: 12/27/2019   ADDENDUM REPORT: 12/27/2019 03:58 ADDENDUM: These results were called by telephone on 12/27/2019 at 3:58 am to provider JADE SUNG , who verbally acknowledged these results. Electronically Signed   By: Lovena Le M.D.   On: 12/27/2019 03:58   Result Date: 12/27/2019 CLINICAL DATA:  Nausea vomiting, left-sided abdominal pain, recent dialysis 2 days prior EXAM: CT ABDOMEN AND PELVIS WITH CONTRAST TECHNIQUE: Multidetector CT imaging of the abdomen and pelvis was performed using the standard protocol following bolus administration of intravenous contrast. CONTRAST:  153mL OMNIPAQUE IOHEXOL 300 MG/ML  SOLN COMPARISON:  CT 05/01/2018 FINDINGS: Lower chest: Dependent atelectatic changes noted in the lung bases. Chronic left basilar thickening calcified pleural plaque with adjacent rounded atelectasis in the left lower lobe, similar to comparison exam. No new consolidative opacity. Mild cardiomegaly. Dense calcification of the mitral annulus and upon the chordae tendinae. Apical thinning of the left ventricle with calcification may reflect sequela of prior infarct. Coronary artery calcifications are present. No pericardial effusion. Hepatobiliary: There is a diffusely did dysplastic appearance throughout the liver with a markedly nodular liver  surface contour, similar to comparison exams. This likely reflect sequela of chronic fibrotic changes of cirrhosis. No focal liver lesion. Mild gallbladder wall thickening is nonspecific in the setting of intrinsic liver disease. No visible calcified gallstones or biliary ductal dilatation. Pancreas: Unremarkable. No pancreatic ductal dilatation or surrounding inflammatory changes. Spleen: Wedge-shaped regions of hypoattenuation in the central spleen and anterior spleen (2/39 for example) have an appearance suggestive of splenic infarct. Splenomegaly is noted. Adrenals/Urinary Tract: Normal adrenal glands. Diffusely atrophic and dysplastic appearance of the kidneys with multiple small cysts of mixed attenuation many of which are unchanged from comparison in 2019. Findings are compatible with chronic dialysis dependence. No hydronephrosis or urolithiasis. No new concerning renal lesion. Urinary bladder is largely decompressed at the time of exam and therefore poorly evaluated by CT imaging. Diffuse bladder wall thickening is noted. A calcification seen along the and anterior bladder wall is unchanged prior. Stomach/Bowel: Extensive paraesophageal varices. Gastric varices are more mild. Diffuse mild edematous thickening of the small bowel likely related to the moderate volume ascites and or portal enteropathy. A normal appendix is visualized. No colonic dilatation or wall thickening. Moderate stool burden. Minimal rectal wall thickening with extensive perirectal varices as well. Vascular/Lymphatic: Extensive aortoiliac atherosclerosis. No aneurysm or  ectasia. The hepatic veins are poorly opacified. Dense calcification in the hepatic veins may suggest some chronic thrombosis. There is recanalization of the umbilical vein as well as caput medusa across the anterior abdomen. There are extensive upper abdominal venous collaterals including esophageal and gastric varices, as above. Evidence of splenorenal shunting is  noted. The portal vein and splenic veins are patent. Question a portal venous stent. There is notable hypodense expansile filling defect extending from the left common femoral vein and greater saphenous vein into the iliac vein and IVC which extends to the level of the confluence with the renal veins. Reproductive: Prostate is unremarkable. Few adjacent varices of the right gonadal vein. Other: Moderate volume ascites. No bowel containing hernia. No abdominopelvic free air, pneumatosis or portal venous gas. Musculoskeletal: Chronic osseous manifestations of hyperparathyroidism/renal osteodystrophy. Prior changes of bilateral sacroiliitis which are progressive from prior studies. Additional erosive changes along the left symphysis pubis are similar to the comparison. No new concerning osseous lesions. IMPRESSION: 1. Wedge-shaped regions of hypoattenuation in the central spleen and anterior spleen have an appearance suggestive of splenic infarct. 2. Stigmata of cirrhosis and portal hypertension with a markedly nodular and dysplastic appearing liver, extensive venous collateralization through the abdomen, splenomegaly, and moderate volume ascites. 3. Venous collaterals including a recanalized umbilical vein, gastric and esophageal varices, and rectal varices. 4. Calcification in the hepatic vein could reflect sequela of remote or chronic thrombosis, poorly opacified on this exam. 5. There is hypodense filling defect extending from the left lower extremity from the femoral and greater saphenous veins into the common femoral, iliac and IVC which extends to the confluence of the renal arteries. 6. Diffuse mild edematous thickening of the small bowel likely related to the moderate volume ascites and/or portal enteropathy. 7. Diffuse bladder wall thickening, possibly related to intrinsic liver disease. Recommend correlation with urinalysis to exclude cystitis. 8. Chronic osseous manifestations of hyperparathyroidism/renal  osteodystrophy. 9. Prior changes of bilateral sacroiliitis which are progressive from prior studies. Additional erosive changes along the left symphysis pubis are similar to the comparison. 10. Chronic left basilar thickening calcified pleural plaque with adjacent rounded atelectasis in the left lower lobe, similar to comparison in 2019. 11. Aortic Atherosclerosis (ICD10-I70.0). Currently attempting to contact the ordering provider with a critical value result. Addendum will be submitted upon case discussion. Electronically Signed: By: Lovena Le M.D. On: 12/27/2019 03:53     Medications:   . heparin 1,050 Units/hr (12/27/19 0436)   . Chlorhexidine Gluconate Cloth  6 each Topical Q0600  . nicotine  21 mg Transdermal Daily   albuterol, dextromethorphan-guaiFENesin, hydrALAZINE, hydrOXYzine, iohexol, morphine injection, oxyCODONE  Assessment/ Plan:  49 y.o. male with  was admitted on 12/27/2019 for  Principal Problem:   Splenic infarction Active Problems:   ESRD (end stage renal disease) on dialysis (HCC)   Chronic diastolic (congestive) heart failure (HCC)   Cirrhosis (HCC)   HTN (hypertension)   Tobacco abuse   Anemia in ESRD (end-stage renal disease) ([place])  Splenic infarction [D73.5] Thromboembolism (HCC) [I74.9] Splenic infarct [D73.5] ESRD (end stage renal disease) on dialysis ([place]) [N18.6, Z99.2] Abdominal pain, unspecified abdominal location [R10.9] Non-intractable vomiting with nausea, unspecified vomiting type [R11.2]  #. ESRD [employer] [facility]/TTS/[facility] weight 65 kg/[facility] nephrology We will arrange for hemodialysis on his usual schedule while he is in the hospital Orders prepared Potassium 4.8  #. Anemia of CKD  Lab Results  Component Value Date   HGB 11.2 (L) 12/26/2019   Low dose EPO with  HD once hemoglobin less than 11  #.Secondary hyperparathyroidism of renal origin N 25.81      Component Value Date/Time   PTH [facility] 04/12/2019 1349   Lab  Results  Component Value Date   PHOS 6.5 (H) 12/27/2019   Monitor calcium and phos level during this admission  #Left upper quadrant abdominal pain, splenic infarction IV heparin Pain control Vascular surgery evaluation in progress    LOS: 0 Presleigh Feldstein Candiss Norse 5/25/20214:47 PM  [facility] [place], [place]

## 2019-12-27 NOTE — Progress Notes (Signed)
Post HD  

## 2019-12-27 NOTE — Progress Notes (Signed)
Hemodialysis patient known at [facility] TTS 6:15am, family transports. Please contact me with any dialysis placement concerns.  Elvera Bicker Dialysis Coordinator  [phone]

## 2019-12-27 NOTE — Progress Notes (Signed)
ANTICOAGULATION CONSULT NOTE - Follow up [place] for Heparin Indication: splenic infarct  Allergies  Allergen Reactions  . Betadine [Povidone Iodine] Rash    Patient Measurements: Height: 5\' 5"  (165.1 cm) Weight: 65 kg (143 lb 4.8 oz) IBW/kg (Calculated) : 61.5 HEPARIN DW (KG): 65  Vital Signs: Temp: 97.8 F (36.6 C) (05/25 1608) Temp Source: Oral (05/25 1230) BP: 184/72 (05/25 1657) Pulse Rate: 67 (05/25 1657)  Labs: Recent Labs    12/26/19 2109 12/27/19 0245 12/27/19 1112 12/27/19 1922  HGB 11.2*  --   --   --   HCT 33.4*  --   --   --   PLT 126*  --   --   --   APTT  --   --  155*  --   LABPROT  --  15.6*  --   --   INR  --  1.3*  --   --   HEPARINUNFRC  --   --  0.38 0.44  CREATININE 10.23*  --   --   --     Estimated Creatinine Clearance: 7.6 mL/min (A) (by C-G formula based on SCr of 10.23 mg/dL (H)).   Medical History: Past Medical History:  Diagnosis Date  . Abdominal pain 08/28/2016  . Acute hyperkalemia 10/28/2016  . Chest pain 04/11/2016  . Chronic combined systolic and diastolic CHF (congestive heart failure) ([place])   . Cirrhosis ([place])   . Dialysis patient ([place])   . ESRD (end stage renal disease) on dialysis ([place]) 01/25/2015  . Fluid overload 04/12/2016  . HTN (hypertension) 10/13/2016  . Hyperkalemia 11/27/2016  . Hypertension   . Mitral regurgitation   . Pulmonary edema 07/03/2016  . Renal disorder   . Renal insufficiency   . Scrotal infection 01/25/2015    Assessment: Heparin initiated by EDP.  No anticoagulants per PTA med list.  Baseline labs ordered.  Pharmacy asked to follow and manage.  5/25 Heparin infusion stared @ 1050 units/hr.  05/25 @ 1112 HL: 0.38. Level is therapeutic x 1.  Goal of Therapy:  Heparin level 0.3-0.7 units/ml Monitor platelets by anticoagulation protocol: Yes   Plan:  05/25 @ 1922 HL: 0.44. Level is therapeutic x 2. Continue current infusion rate of 1050 units/hr. Recheck HL with AM labs.   Monitor CBC daily while on heparin infusion.   Pharmacy will continue to follow   Rocky Morel, PharmD, BCPS Clinical Pharmacist 12/27/2019 7:55 PM

## 2019-12-27 NOTE — ED Provider Notes (Signed)
[facility] Emergency Department Provider Note   ____________________________________________   None    (approximate)  I have reviewed the triage vital signs and the nursing notes.   HISTORY  Chief Complaint Abdominal Pain  History obtained via tele-Spanish interpreter  HPI Mario [name] is a 49 y.o. male who presents to the ED from home with a chief complaint of left flank and abdominal pain.  Patient has a history of ESRD on HD T/TH/SAT, CHF, hypertension, cirrhosis who reports onset of left flank and abdominal pain approximately 5 days ago.  Symptoms associated with subjective fevers and nausea/vomiting in the mornings.  Patient does not make urine.  Denies cough, chest pain, shortness of breath, testicular pain or swelling, diarrhea.  Denies recent travel or trauma.  No antipyretic taken prior to arrival.       Past Medical History:  Diagnosis Date  . Abdominal pain 08/28/2016  . Acute hyperkalemia 10/28/2016  . Chest pain 04/11/2016  . Chronic combined systolic and diastolic CHF (congestive heart failure) ([place])   . Cirrhosis ([place])   . Dialysis patient ([place])   . ESRD (end stage renal disease) on dialysis ([place]) 01/25/2015  . Fluid overload 04/12/2016  . HTN (hypertension) 10/13/2016  . Hyperkalemia 11/27/2016  . Hypertension   . Mitral regurgitation   . Pulmonary edema 07/03/2016  . Renal disorder   . Renal insufficiency   . Scrotal infection 01/25/2015    Patient Active Problem List   Diagnosis Date Noted  . Tobacco abuse 04/16/2019  . Anemia 04/16/2019  . Neck pain, bilateral posterior 04/16/2019  . Cervical spinal stenosis 04/13/2019  . Cervical vertebral fracture ([place]) 04/13/2019  . Neck pain 04/03/2019  . Cervical radiculopathy 04/03/2019  . Hypertensive urgency 04/01/2019  . Intractable abdominal pain 05/01/2018  . Gastroenteritis 10/02/2017  . Abdominal pain, acute 08/21/2017  . Malnutrition of moderate degree 08/21/2017  .  SBP (spontaneous bacterial peritonitis) ([place]) 06/09/2017  . Erroneous encounter - disregard 03/04/2017  . Hyperkalemia 11/27/2016  . Acute hyperkalemia 10/28/2016  . Chronic combined systolic and diastolic CHF (congestive heart failure) ([place]) 10/13/2016  . Cirrhosis ([place]) 10/13/2016  . HTN (hypertension) 10/13/2016  . Abdominal pain 08/28/2016  . Pulmonary edema 07/03/2016  . Fluid overload 04/12/2016  . Chest pain 04/11/2016  . Scrotal infection 01/25/2015  . ESRD (end stage renal disease) on dialysis ([place]) 01/25/2015    Past Surgical History:  Procedure Laterality Date  . ANTERIOR CERVICAL CORPECTOMY N/A 04/05/2019   Procedure: ANTERIOR CERVICAL CORPECTOMY Cervical six and Cervical seven with strut graft fusion from Cervical five-Thoracic one   ;  Surgeon: Earnie Larsson, MD;  Location: [facility];  Service: Neurosurgery;  Laterality: N/A;  . AV FISTULA PLACEMENT    . POSTERIOR CERVICAL FUSION/FORAMINOTOMY N/A 04/05/2019   Procedure: Posterior cervical fusion utilizing segmental instrumentation from Cervical five-Thoracic one ;  Surgeon: Earnie Larsson, MD;  Location: [facility];  Service: Neurosurgery;  Laterality: N/A;    Prior to Admission medications   Medication Sig Start Date End Date Taking? Authorizing Provider  acetaminophen (TYLENOL) 500 MG tablet Take 500 mg by mouth every 6 (six) hours as needed for mild pain.    [provider]  amLODipine (NORVASC) 10 MG tablet Take 1 tablet (10 mg total) by mouth daily. 04/14/19 05/14/19  Darliss Cheney, MD  cyclobenzaprine (FLEXERIL) 10 MG tablet Take 1 tablet (10 mg total) by mouth 3 (three) times daily. 04/13/19   Darliss Cheney, MD  ferric citrate Lorin Picket)  1 GM 210 MG(Fe) tablet Take 420 mg by mouth 3 (three) times daily with meals.     [provider]  lisinopril (ZESTRIL) 20 MG tablet Take 1 tablet (20 mg total) by mouth daily. 04/14/19 05/14/19  Darliss Cheney, MD  oxyCODONE (OXY IR/ROXICODONE) 5 MG immediate release tablet Take 1  tablet (5 mg total) by mouth every 6 (six) hours as needed for up to 15 doses for moderate pain. 04/13/19   Darliss Cheney, MD    Allergies Betadine [povidone iodine]  Family History  Problem Relation Age of Onset  . Kidney failure Father   . Diabetes Mother     Social History Social History   Tobacco Use  . Smoking status: Current Every Day Smoker    Packs/day: 0.25    Years: 25.00    Pack years: 6.25    Types: Cigarettes  . Smokeless tobacco: Never Used  Substance Use Topics  . Alcohol use: No    Comment: pt denies  . Drug use: No    Review of Systems  Constitutional: Positive for subjective fevers Eyes: No visual changes. ENT: No sore throat. Cardiovascular: Denies chest pain. Respiratory: Denies shortness of breath. Gastrointestinal: Positive for left flank and abdominal pain.  Positive for nausea and vomiting.  No diarrhea.  No constipation. Genitourinary: Negative for dysuria. Musculoskeletal: Negative for back pain. Skin: Negative for rash. Neurological: Negative for headaches, focal weakness or numbness.   ____________________________________________   PHYSICAL EXAM:  VITAL SIGNS: ED Triage Vitals  Enc Vitals Group     BP 12/26/19 2110 125/64     Pulse Rate 12/26/19 2110 68     Resp 12/26/19 2110 20     Temp 12/26/19 2110 98.5 F (36.9 C)     Temp Source 12/26/19 2110 Oral     SpO2 12/26/19 2110 96 %     Weight 12/26/19 2107 143 lb 4.8 oz (65 kg)     Height 12/26/19 2107 5\' 5"  (1.651 m)     Head Circumference --      Peak Flow --      Pain Score 12/26/19 2106 10     Pain Loc --      Pain Edu? --      Excl. in [place]? --     Constitutional: Alert and oriented.  Uncomfortable appearing and in mild acute distress. Eyes: Conjunctivae are normal. PERRL. EOMI. Head: Atraumatic. Nose: No congestion/rhinnorhea. Mouth/Throat: Mucous membranes are mildly dry. Neck: No stridor.   Cardiovascular: Normal rate, regular rhythm. Grossly normal heart sounds.   Good peripheral circulation. Respiratory: Normal respiratory effort.  No retractions. Lungs CTAB. Gastrointestinal: Soft and mildly tender to palpation left upper and lower quadrant without rebound or guarding. No distention. No abdominal bruits.  Mild left CVA tenderness. Musculoskeletal: No lower extremity tenderness nor edema.  No joint effusions. Neurologic:  Normal speech and language. No gross focal neurologic deficits are appreciated.  Skin:  Skin is warm, dry and intact. No rash noted. Psychiatric: Mood and affect are normal. Speech and behavior are normal.  ____________________________________________   LABS (all labs ordered are listed, but only abnormal results are displayed)  Labs Reviewed  BASIC METABOLIC PANEL - Abnormal; Notable for the following components:      Result Value   Chloride 92 (*)    BUN 49 (*)    Creatinine, Ser 10.23 (*)    Calcium 7.7 (*)    GFR calc non Af Amer 5 (*)    GFR calc Af  Amer 6 (*)    Anion gap 18 (*)    All other components within normal limits  CBC - Abnormal; Notable for the following components:   RBC 3.47 (*)    Hemoglobin 11.2 (*)    HCT 33.4 (*)    Platelets 126 (*)    All other components within normal limits  HEPATIC FUNCTION PANEL - Abnormal; Notable for the following components:   Alkaline Phosphatase 163 (*)    All other components within normal limits  PROTIME-INR - Abnormal; Notable for the following components:   Prothrombin Time 15.6 (*)    INR 1.3 (*)    All other components within normal limits  SARS CORONAVIRUS 2 BY RT PCR (HOSPITAL ORDER, [facility])  LIPASE, BLOOD   ____________________________________________  EKG  ED ECG REPORT I, Reo Portela J, the attending physician, personally viewed and interpreted this ECG.   Date: 12/27/2019  EKG Time: 0420  Rate: 70  Rhythm: normal EKG, normal sinus rhythm  Axis: Normal  Intervals:none  ST&T Change:  Nonspecific  ____________________________________________  RADIOLOGY  ED MD interpretation: Small left pleural effusion on chest x-ray; CT abdomen pelvis discussed with Dr. Marguerita Merles which demonstrates splenic infarct and extensive thromboembolism  Official radiology report(s): DG Chest 2 View  Result Date: 12/27/2019 CLINICAL DATA:  Left upper quadrant pain. Vomiting. EXAM: CHEST - 2 VIEW COMPARISON:  Chest radiograph 03/24/2019 FINDINGS: Unchanged heart size and mediastinal contours. Peribronchial thickening. Suspected small left pleural effusion. Subsegmental atelectasis at the bases. No pneumothorax. Surgical hardware in the cervical spine is partially included. IMPRESSION: 1. Mild cardiomegaly. Peribronchial thickening may be bronchitic or congestive. 2. Suspected small left pleural effusion. Electronically Signed   By: Keith Rake M.D.   On: 12/27/2019 02:09   CT Abdomen Pelvis W Contrast  Addendum Date: 12/27/2019   ADDENDUM REPORT: 12/27/2019 03:58 ADDENDUM: These results were called by telephone on 12/27/2019 at 3:58 am to provider Chae Oommen , who verbally acknowledged these results. Electronically Signed   By: Lovena Le M.D.   On: 12/27/2019 03:58   Result Date: 12/27/2019 CLINICAL DATA:  Nausea vomiting, left-sided abdominal pain, recent dialysis 2 days prior EXAM: CT ABDOMEN AND PELVIS WITH CONTRAST TECHNIQUE: Multidetector CT imaging of the abdomen and pelvis was performed using the standard protocol following bolus administration of intravenous contrast. CONTRAST:  157mL OMNIPAQUE IOHEXOL 300 MG/ML  SOLN COMPARISON:  CT 05/01/2018 FINDINGS: Lower chest: Dependent atelectatic changes noted in the lung bases. Chronic left basilar thickening calcified pleural plaque with adjacent rounded atelectasis in the left lower lobe, similar to comparison exam. No new consolidative opacity. Mild cardiomegaly. Dense calcification of the mitral annulus and upon the chordae tendinae. Apical thinning  of the left ventricle with calcification may reflect sequela of prior infarct. Coronary artery calcifications are present. No pericardial effusion. Hepatobiliary: There is a diffusely did dysplastic appearance throughout the liver with a markedly nodular liver surface contour, similar to comparison exams. This likely reflect sequela of chronic fibrotic changes of cirrhosis. No focal liver lesion. Mild gallbladder wall thickening is nonspecific in the setting of intrinsic liver disease. No visible calcified gallstones or biliary ductal dilatation. Pancreas: Unremarkable. No pancreatic ductal dilatation or surrounding inflammatory changes. Spleen: Wedge-shaped regions of hypoattenuation in the central spleen and anterior spleen (2/39 for example) have an appearance suggestive of splenic infarct. Splenomegaly is noted. Adrenals/Urinary Tract: Normal adrenal glands. Diffusely atrophic and dysplastic appearance of the kidneys with multiple small cysts of mixed attenuation many of  which are unchanged from comparison in 2019. Findings are compatible with chronic dialysis dependence. No hydronephrosis or urolithiasis. No new concerning renal lesion. Urinary bladder is largely decompressed at the time of exam and therefore poorly evaluated by CT imaging. Diffuse bladder wall thickening is noted. A calcification seen along the and anterior bladder wall is unchanged prior. Stomach/Bowel: Extensive paraesophageal varices. Gastric varices are more mild. Diffuse mild edematous thickening of the small bowel likely related to the moderate volume ascites and or portal enteropathy. A normal appendix is visualized. No colonic dilatation or wall thickening. Moderate stool burden. Minimal rectal wall thickening with extensive perirectal varices as well. Vascular/Lymphatic: Extensive aortoiliac atherosclerosis. No aneurysm or ectasia. The hepatic veins are poorly opacified. Dense calcification in the hepatic veins may suggest some  chronic thrombosis. There is recanalization of the umbilical vein as well as caput medusa across the anterior abdomen. There are extensive upper abdominal venous collaterals including esophageal and gastric varices, as above. Evidence of splenorenal shunting is noted. The portal vein and splenic veins are patent. Question a portal venous stent. There is notable hypodense expansile filling defect extending from the left common femoral vein and greater saphenous vein into the iliac vein and IVC which extends to the level of the confluence with the renal veins. Reproductive: Prostate is unremarkable. Few adjacent varices of the right gonadal vein. Other: Moderate volume ascites. No bowel containing hernia. No abdominopelvic free air, pneumatosis or portal venous gas. Musculoskeletal: Chronic osseous manifestations of hyperparathyroidism/renal osteodystrophy. Prior changes of bilateral sacroiliitis which are progressive from prior studies. Additional erosive changes along the left symphysis pubis are similar to the comparison. No new concerning osseous lesions. IMPRESSION: 1. Wedge-shaped regions of hypoattenuation in the central spleen and anterior spleen have an appearance suggestive of splenic infarct. 2. Stigmata of cirrhosis and portal hypertension with a markedly nodular and dysplastic appearing liver, extensive venous collateralization through the abdomen, splenomegaly, and moderate volume ascites. 3. Venous collaterals including a recanalized umbilical vein, gastric and esophageal varices, and rectal varices. 4. Calcification in the hepatic vein could reflect sequela of remote or chronic thrombosis, poorly opacified on this exam. 5. There is hypodense filling defect extending from the left lower extremity from the femoral and greater saphenous veins into the common femoral, iliac and IVC which extends to the confluence of the renal arteries. 6. Diffuse mild edematous thickening of the small bowel likely related  to the moderate volume ascites and/or portal enteropathy. 7. Diffuse bladder wall thickening, possibly related to intrinsic liver disease. Recommend correlation with urinalysis to exclude cystitis. 8. Chronic osseous manifestations of hyperparathyroidism/renal osteodystrophy. 9. Prior changes of bilateral sacroiliitis which are progressive from prior studies. Additional erosive changes along the left symphysis pubis are similar to the comparison. 10. Chronic left basilar thickening calcified pleural plaque with adjacent rounded atelectasis in the left lower lobe, similar to comparison in 2019. 11. Aortic Atherosclerosis (ICD10-I70.0). Currently attempting to contact the ordering provider with a critical value result. Addendum will be submitted upon case discussion. Electronically Signed: By: Lovena Le M.D. On: 12/27/2019 03:53    ____________________________________________   PROCEDURES  Procedure(s) performed (including Critical Care):  Procedures  CRITICAL CARE Performed by: Paulette Blanch   Total critical care time: 30 minutes  Critical care time was exclusive of separately billable procedures and treating other patients.  Critical care was necessary to treat or prevent imminent or life-threatening deterioration.  Critical care was time spent personally by me on the following activities: development of treatment plan  with patient and/or surrogate as well as nursing, discussions with consultants, evaluation of patient's response to treatment, examination of patient, obtaining history from patient or surrogate, ordering and performing treatments and interventions, ordering and review of laboratory studies, ordering and review of radiographic studies, pulse oximetry and re-evaluation of patient's condition.  ____________________________________________   INITIAL IMPRESSION / ASSESSMENT AND PLAN / ED COURSE  As part of my medical decision making, I reviewed the following data within the  [facility] notes reviewed and incorporated, Labs reviewed, Old chart reviewed, Radiograph reviewed and Notes from prior ED visits     [place] was evaluated in Emergency Department on 12/27/2019 for the symptoms described in the history of present illness. He was evaluated in the context of the global COVID-19 pandemic, which necessitated consideration that the patient might be at risk for infection with the SARS-CoV-2 virus that causes COVID-19. Institutional protocols and algorithms that pertain to the evaluation of patients at risk for COVID-19 are in a state of rapid change based on information released by regulatory bodies including the CDC and federal and state organizations. These policies and algorithms were followed during the patient's care in the ED.    49 year old male with ESRD on HD, CHF, cirrhosis, hypertension presenting with left flank and abdominal pain. Differential diagnosis includes, but is not limited to, acute appendicitis, renal colic, testicular torsion, urinary tract infection/pyelonephritis, prostatitis,  epididymitis, diverticulitis, small bowel obstruction or ileus, colitis, abdominal aortic aneurysm, gastroenteritis, hernia, etc.  Laboratory results reveal normal WBC, elevated creatinine consistent with history of ESRD.  Will check LFTs, coags.  Obtain chest x-ray, CT abdomen/pelvis.  Administer judicious fluids, IV morphine for pain paired with IV Zofran for nausea.  Will reassess.   Clinical Course as of Dec 27 407  Tue Dec 27, 2019  0157 Patient noted to be eating Cheetos and asked to stop eating while he is being evaluated.   [JS]  [id number] Updated patient via Spanish interpreter of CT imaging result.  Will initiate heparin bolus with drip.  Will contact hospitalist services for admission.   [JS]    Clinical Course User Index [JS] Paulette Blanch, MD     ____________________________________________   FINAL CLINICAL  IMPRESSION(S) / ED DIAGNOSES  Final diagnoses:  Abdominal pain, unspecified abdominal location  Non-intractable vomiting with nausea, unspecified vomiting type  Splenic infarct  ESRD (end stage renal disease) on dialysis ([place])  Thromboembolism [facility])     ED Discharge Orders    None       Note:  This document was prepared using Dragon voice recognition software and may include unintentional dictation errors.   Paulette Blanch, MD 12/27/19 [phone]

## 2019-12-27 NOTE — Consult Note (Signed)
[facility] Vascular Consult Note  MRN : [id number]  Mario [name] Mario [name] is a 49 y.o. (1970-12-03) male who presents with chief complaint of  Chief Complaint  Patient presents with  . Abdominal Pain  .  History of Present Illness:   I am asked to evaluate the patient by Dr. Blaine Hamper for splenic infarction.  The patient is a 49 year old gentleman with multiple medical problems who presented to the emergency room yesterday with increasing pain of the left upper quadrant and left flank area.  Patient notes have been going on for several days.  He feels it been increasing in intensity and so he sought medical attention.  It is associated with some nausea and vomiting.  He denies fever or chills.  He denies any trauma.  He does have a history of significant cirrhosis with portal hypertension as well as end-stage renal disease and congestive heart failure.  I have personally reviewed the CT and the findings are consistent with a splenic infarction.  I do not find evidence of vascular pathology in the distal thoracic aorta celiac axis or splenic artery that would be a likely etiology for this.  Of note there is mention made of an abnormality of the left common femoral and iliac veins.  When I look back at a CT scan from 2019 this finding was also present and could represent chronic deep vein thrombosis.   Current Facility-Administered Medications  Medication Dose Route Frequency Provider Last Rate Last Admin  . albuterol (PROVENTIL) (2.5 MG/3ML) 0.083% nebulizer solution 2.5 mg  2.5 mg Inhalation Q4H PRN Ivor Costa, MD      . Chlorhexidine Gluconate Cloth 2 % PADS 6 each  6 each Topical Q0600 Murlean Iba, MD      . dextromethorphan-guaiFENesin ([place] DM) 30-600 MG per 12 hr tablet 1 tablet  1 tablet Oral BID PRN Ivor Costa, MD      . Derrill Memo ON 12/28/2019] ferric citrate (AURYXIA) tablet 420 mg  420 mg Oral TID WC Ivor Costa, MD      . heparin ADULT infusion 100  units/mL (25000 units/240mL sodium chloride 0.45%)  1,050 Units/hr Intravenous Continuous Ivor Costa, MD 10.5 mL/hr at 12/27/19 0436 1,050 Units/hr at 12/27/19 0436  . hydrALAZINE (APRESOLINE) injection 5 mg  5 mg Intravenous Q2H PRN Ivor Costa, MD      . hydrOXYzine (ATARAX/VISTARIL) tablet 25 mg  25 mg Oral Q6H PRN Ivor Costa, MD      . iohexol (OMNIPAQUE) 9 MG/ML oral solution 500 mL  500 mL Oral BID PRN Ivor Costa, MD   500 mL at 12/27/19 0226  . lisinopril (ZESTRIL) tablet 20 mg  20 mg Oral Daily Ivor Costa, MD   20 mg at 12/27/19 1740  . morphine 2 MG/ML injection 1 mg  1 mg Intravenous Q1H PRN Ivor Costa, MD   1 mg at 12/27/19 1740  . nicotine (NICODERM CQ - dosed in mg/24 hours) patch 21 mg  21 mg Transdermal Daily Ivor Costa, MD   21 mg at 12/27/19 1125  . oxyCODONE (Oxy IR/ROXICODONE) immediate release tablet 5 mg  5 mg Oral Q6H PRN Ivor Costa, MD       Current Outpatient Medications  Medication Sig Dispense Refill  . ferric citrate (AURYXIA) 1 GM 210 MG(Fe) tablet Take 420 mg by mouth 3 (three) times daily with meals.     Marland Kitchen acetaminophen (TYLENOL) 500 MG tablet Take 500 mg by mouth every 6 (six) hours as needed for  mild pain.    Marland Kitchen amLODipine (NORVASC) 10 MG tablet Take 1 tablet (10 mg total) by mouth daily. (Patient not taking: Reported on 12/27/2019) 30 tablet 0  . cyclobenzaprine (FLEXERIL) 10 MG tablet Take 1 tablet (10 mg total) by mouth 3 (three) times daily. (Patient not taking: Reported on 12/27/2019) 30 tablet 0  . lisinopril (ZESTRIL) 20 MG tablet Take 1 tablet (20 mg total) by mouth daily. 30 tablet 0  . oxyCODONE (OXY IR/ROXICODONE) 5 MG immediate release tablet Take 1 tablet (5 mg total) by mouth every 6 (six) hours as needed for up to 15 doses for moderate pain. (Patient not taking: Reported on 12/27/2019) 15 tablet 0    Past Medical History:  Diagnosis Date  . Abdominal pain 08/28/2016  . Acute hyperkalemia 10/28/2016  . Chest pain 04/11/2016  . Chronic combined systolic and  diastolic CHF (congestive heart failure) ([facility])   . Cirrhosis ([place])   . Dialysis patient ([facility])   . ESRD (end stage renal disease) on dialysis ([place]) 01/25/2015  . Fluid overload 04/12/2016  . HTN (hypertension) 10/13/2016  . Hyperkalemia 11/27/2016  . Hypertension   . Mitral regurgitation   . Pulmonary edema 07/03/2016  . Renal disorder   . Renal insufficiency   . Scrotal infection 01/25/2015    Past Surgical History:  Procedure Laterality Date  . ANTERIOR CERVICAL CORPECTOMY N/A 04/05/2019   Procedure: ANTERIOR CERVICAL CORPECTOMY Cervical six and Cervical seven with strut graft fusion from Cervical five-Thoracic one   ;  Surgeon: Earnie Larsson, MD;  Location: [facility];  Service: Neurosurgery;  Laterality: N/A;  . AV FISTULA PLACEMENT    . POSTERIOR CERVICAL FUSION/FORAMINOTOMY N/A 04/05/2019   Procedure: Posterior cervical fusion utilizing segmental instrumentation from Cervical five-Thoracic one ;  Surgeon: Earnie Larsson, MD;  Location: [facility];  Service: Neurosurgery;  Laterality: N/A;    Social History Social History   Tobacco Use  . Smoking status: Current Every Day Smoker    Packs/day: 0.25    Years: 25.00    Pack years: 6.25    Types: Cigarettes  . Smokeless tobacco: Never Used  Substance Use Topics  . Alcohol use: No    Comment: pt denies  . Drug use: No    Family History Family History  Problem Relation Age of Onset  . Kidney failure Father   . Diabetes Mother   No family history of bleeding/clotting disorders, porphyria or autoimmune disease   Allergies  Allergen Reactions  . Betadine [Povidone Iodine] Rash     REVIEW OF SYSTEMS (Negative unless checked)  Constitutional: [] Weight loss  [] Fever  [] Chills Cardiac: [] Chest pain   [] Chest pressure   [] Palpitations   [x] Shortness of breath when laying flat   [] Shortness of breath at rest   [] Shortness of breath with exertion. Vascular:  [] Pain in legs with walking   [] Pain in legs at rest   [] Pain in legs when laying flat    [] Claudication   [] Pain in feet when walking  [] Pain in feet at rest  [] Pain in feet when laying flat   [] History of DVT   [] Phlebitis   [x] Swelling in legs   [] Varicose veins   [] Non-healing ulcers Pulmonary:   [] Uses home oxygen   [] Productive cough   [] Hemoptysis   [] Wheeze  [] COPD   [] Asthma Neurologic:  [] Dizziness  [] Blackouts   [] Seizures   [] History of stroke   [] History of TIA  [] Aphasia   [] Temporary blindness   [] Dysphagia   [] Weakness or numbness  in arms   [] Weakness or numbness in legs Musculoskeletal:  [] Arthritis   [] Joint swelling   [] Joint pain   [] Low back pain Hematologic:  [] Easy bruising  [] Easy bleeding   [] Hypercoagulable state   [] Anemic  [] Hepatitis Gastrointestinal:  [] Blood in stool   [] Vomiting blood  [] Gastroesophageal reflux/heartburn   [] Difficulty swallowing. Genitourinary:  [x] Chronic kidney disease   [] Difficult urination  [] Frequent urination  [] Burning with urination   [] Blood in urine Skin:  [] Rashes   [] Ulcers   [] Wounds Psychological:  [] History of anxiety   []  History of major depression.   Physical Examination  Vitals:   12/27/19 1600 12/27/19 1608 12/27/19 1615 12/27/19 1657  BP: (!) 149/72 (!) 157/73 (!) 150/77 (!) 184/72  Pulse: 66 66 64 67  Resp: 16 18 (!) 23 20  Temp:  97.8 F (36.6 C)    TempSrc:      SpO2: 100% 100% 99% 100%  Weight:      Height:       Body mass index is 23.85 kg/m.  Head: [place]/AT, No temporalis wasting. Prominent temp pulse not noted. Ear/Nose/Throat: Nares w/o erythema or drainage, oropharynx w/o obsrtuction,   Eyes: PERRLA, Sclera nonicteric.  Neck: Supple, no nuchal rigidity.  No bruit or JVD.  Pulmonary: No audible wheezing, no use of accessory muscles.  Cardiac: RRR, no evidence of a hyperdynamic precordium. Vascular: Palpable radial pulses bilaterally.  Edema bilateral lower extremities Gastrointestinal: soft, tender left side, distended.  Musculoskeletal: Moves all extremities.  No deformity or atrophy. No  edema. Neurologic: CN 2-12 intact. Symmetrical.  Speech is fluent.  Psychiatric: Judgment intact, Mood & affect appropriate for pt's clinical situation. Dermatologic: No rashes or ulcers noted.  No cellulitis or open wounds.      CBC Lab Results  Component Value Date   WBC 6.4 12/26/2019   HGB 11.2 (L) 12/26/2019   HCT 33.4 (L) 12/26/2019   MCV 96.3 12/26/2019   PLT 126 (L) 12/26/2019    BMET    Component Value Date/Time   NA 136 12/26/2019 2109   K 4.8 12/26/2019 2109   CL 92 (L) 12/26/2019 2109   CO2 26 12/26/2019 2109   GLUCOSE 99 12/26/2019 2109   BUN 49 (H) 12/26/2019 2109   CREATININE 10.23 (H) 12/26/2019 2109   CALCIUM 7.7 (L) 12/26/2019 2109   GFRNONAA 5 (L) 12/26/2019 2109   GFRAA 6 (L) 12/26/2019 2109   Estimated Creatinine Clearance: 7.6 mL/min (A) (by C-G formula based on SCr of 10.23 mg/dL (H)).  COAG Lab Results  Component Value Date   INR 1.3 (H) 12/27/2019   INR 1.2 04/03/2019   INR 1.17 01/22/2018    Radiology See H&P for discussion of yesterday's CAT scan as well as CAT scan from 2019    Assessment/Plan 1.  Splenic infarct: Patient does have evidence of a splenic infarct which certainly is consistent with his left upper quadrant and flank pain.  At the present time he has been initiated on heparin anticoagulation which I agree with.  He is also receiving medication for control of his pain.  There are 3 likely sources for his potential splenic embolic event (the third of which is intrinsic to the spleen and not really embolic). 1.  Certainly a vascular source is a common etiology either an ulcerated plaque or an aneurysm.  The CT scan of the abdomen does not show any pathology consistent with either of these conditions so we can exclude a visceral artery aneurysm or a atherosclerotic  ulcer or stenosis from the distal aorta to the spleen.  However, we do not know the status of his aortic arch and more proximal descending aorta.  I will order CT  angio of the chest to evaluate these areas. 2.  Emboli with a cardiac source.  If the CT of the chest does not show any vascular abnormality to explain the infarct then I would move forward with cardiology consultation and a cardiac evaluation.  Both of these conditions would be treated with anticoagulation in part. 3.  If neither his vascular tree or his heart is a likely source then an intrinsic infarct secondary to his portal hypertension and cirrhosis is the most likely cause of his infarct.  This would not necessarily require anticoagulation unless GI were to feel that would be helpful.  Secondarily, there is this comment on the hypodensity in the venous system as noted this appears to be a finding present in 2019 and therefore I do not feel strongly that he needs to be initiated on heparin therapy for this alone however anticoagulation for his embolic event would also cover any acute on chronic condition in this region as well.  Hortencia Pilar, MD  12/27/2019 7:19 PM

## 2019-12-28 ENCOUNTER — Inpatient Hospital Stay: Payer: Self-pay

## 2019-12-28 ENCOUNTER — Encounter: Payer: Self-pay | Admitting: Internal Medicine

## 2019-12-28 LAB — CARDIOLIPIN ANTIBODIES, IGG, IGM, IGA
Anticardiolipin IgA: <9 APL U/mL (ref 0–11)
Anticardiolipin IgG: 9 GPL U/mL (ref 0–14)
Anticardiolipin IgM: 12 MPL U/mL (ref 0–12)

## 2019-12-28 LAB — CBC
HCT: 32.6 % — ABNORMAL LOW (ref 39.0–52.0)
Hemoglobin: 10.9 g/dL — ABNORMAL LOW (ref 13.0–17.0)
MCH: 32.4 pg (ref 26.0–34.0)
MCHC: 33.4 g/dL (ref 30.0–36.0)
MCV: 97 fL (ref 80.0–100.0)
Platelets: 111 10*3/uL — ABNORMAL LOW (ref 150–400)
RBC: 3.36 MIL/uL — ABNORMAL LOW (ref 4.22–5.81)
RDW: 14.3 % (ref 11.5–15.5)
WBC: 4.5 10*3/uL (ref 4.0–10.5)
nRBC: 0 % (ref 0.0–0.2)

## 2019-12-28 LAB — BASIC METABOLIC PANEL
Anion gap: 12 (ref 5–15)
BUN: 25 mg/dL — ABNORMAL HIGH (ref 6–20)
CO2: 28 mmol/L (ref 22–32)
Calcium: 8.4 mg/dL — ABNORMAL LOW (ref 8.9–10.3)
Chloride: 97 mmol/L — ABNORMAL LOW (ref 98–111)
Creatinine, Ser: 6.49 mg/dL — ABNORMAL HIGH (ref 0.61–1.24)
GFR calc Af Amer: 11 mL/min — ABNORMAL LOW (ref >60)
GFR calc non Af Amer: 9 mL/min — ABNORMAL LOW (ref >60)
Glucose, Bld: 79 mg/dL (ref 70–99)
Potassium: 4.9 mmol/L (ref 3.5–5.1)
Sodium: 137 mmol/L (ref 135–145)

## 2019-12-28 LAB — HEPARIN LEVEL (UNFRACTIONATED): Heparin Unfractionated: 0.34 IU/mL (ref 0.30–0.70)

## 2019-12-28 LAB — HIV ANTIBODY (ROUTINE TESTING W REFLEX): HIV Screen 4th Generation wRfx: NONREACTIVE

## 2019-12-28 LAB — HOMOCYSTEINE: Homocysteine: 26.2 umol/L — ABNORMAL HIGH (ref 0.0–14.5)

## 2019-12-28 MED ORDER — IOHEXOL 350 MG/ML SOLN
100.0000 mL | Freq: Once | INTRAVENOUS | Status: AC | PRN
  Administered 2019-12-28: 100 mL via INTRAVENOUS
  Filled 2019-12-28: qty 100

## 2019-12-28 NOTE — Progress Notes (Signed)
PROGRESS NOTE    Mario [name]  KDX:[id number] DOB: [birth year] DOA: 12/27/2019 PCP: [name], MD   Brief Narrative:  Mario [name] is a 49 y.o. male with medical history significant of hypertension, ESRD-HD (TTS), liver cirrhosis, SBP, CHF, anemia, tobacco abuse, mitral valve prolapse, who presents with left upper quadrant abdominal pain and left flank pain. Found to have splenic infarct.  Started on heparin infusion and vascular surgery was consulted.  Subjective: Patient was complaining of left upper quadrant and flank pain.  Denies any nausea or vomiting.  Assessment & Plan:   Principal Problem:   Splenic infarction Active Problems:   ESRD (end stage renal disease) on dialysis (HCC)   Chronic diastolic (congestive) heart failure (HCC)   Cirrhosis (HCC)   HTN (hypertension)   Tobacco abuse   Anemia in ESRD (end-stage renal disease) (HCC)  Splenic infarction: Vascular surgery, Dr. Delana Meyer is consulted. Patient underwent CTA of chest and abdomen today, they did not find any vascular cause for his splenic infarct and they are recommending cardiology consult. -cardiology will be consulted tomorrow. -Continue Heparin infusion. -Continue pain management. -Hypercoagulable panel drawn-pending results. -Monitor any signs of spleen rupture  ESRD (end stage renal disease) on dialysis (TTS) -Dr. Candiss Norse of renal is consulted  Chronic diastolic (congestive) heart failure ([place]): 2D echo on 04/03/2019 showed EF of 60-65%.  Patient does not have leg edema.  No respiratory distress.  CHF seem to be compensated. -Volume management per renal by dialysis.  Lvie Cirrhosis ([place]): Mental status normal. -check INR and PTT -check ammonia level  HTN:  -Continue home medications: Amlodipine and lisinopril -hydralazine prn  Tobacco abuse -nicotine patch  Anemia in ESRD (end-stage renal disease) ([place]): Hgb stable, 11.2 -f/u by CBC  Objective: Vitals:    12/28/19 1600 12/28/19 1730 12/28/19 1747 12/28/19 1748  BP: (!) 177/74 (!) 172/74 (!) 179/72   Pulse:    68  Resp: 18 (!) 23 (!) 22 19  Temp:      TempSrc:      SpO2:    99%  Weight:      Height:       No intake or output data in the 24 hours ending 12/28/19 1820 Filed Weights   12/26/19 2107 12/27/19 1230  Weight: 65 kg 65 kg    Examination:  General exam: Appears calm and comfortable  Respiratory system: Clear to auscultation. Respiratory effort normal. Cardiovascular system: S1 & S2 heard, RRR. No JVD, murmurs, rubs, gallops or clicks. Gastrointestinal system: Soft, nontender, nondistended, bowel sounds positive. Central nervous system: Alert and oriented. No focal neurological deficits.Symmetric 5 x 5 power. Extremities: No edema, no cyanosis, pulses intact and symmetrical.  Large left upper extremity fistula. Psychiatry: Judgement and insight appear normal.   DVT prophylaxis: Heparin Code Status: Full Family Communication: Discussed with patient  disposition Plan:  Status is: Inpatient   Remains inpatient appropriate because:Inpatient level of care appropriate due to severity of illness   Dispo: The patient is from: Home              Anticipated d/c is to: Home              Anticipated d/c date is: 2 days              Patient currently is not medically stable to d/c.   Consultants:   Vascular surgery  Procedures:  Antimicrobials:   Data Reviewed: I have personally reviewed following labs and imaging studies  CBC:  Recent Labs  Lab 12/26/19 2109 12/27/19 1922 12/28/19 0443  WBC 6.4 4.4 4.5  HGB 11.2* 10.8* 10.9*  HCT 33.4* 34.0* 32.6*  MCV 96.3 99.1 97.0  PLT 126* 111* 789*   Basic Metabolic Panel: Recent Labs  Lab 12/26/19 2109 12/27/19 1253 12/28/19 0443  NA 136  --  137  K 4.8  --  4.9  CL 92*  --  97*  CO2 26  --  28  GLUCOSE 99  --  79  BUN 49*  --  25*  CREATININE 10.23*  --  6.49*  CALCIUM 7.7*  --  8.4*  PHOS  --  6.5*  --     GFR: Estimated Creatinine Clearance: 12 mL/min (A) (by C-G formula based on SCr of 6.49 mg/dL (H)). Liver Function Tests: Recent Labs  Lab 12/27/19 0245  AST 25  ALT 15  ALKPHOS 163*  BILITOT 1.1  PROT 7.1  ALBUMIN 3.6   Recent Labs  Lab 12/26/19 2109  LIPASE 36   Recent Labs  Lab 12/27/19 1112  AMMONIA 39*   Coagulation Profile: Recent Labs  Lab 12/27/19 0245  INR 1.3*   Cardiac Enzymes: No results for input(s): CKTOTAL, CKMB, CKMBINDEX, TROPONINI in the last 168 hours. BNP (last 3 results) No results for input(s): PROBNP in the last 8760 hours. HbA1C: No results for input(s): HGBA1C in the last 72 hours. CBG: No results for input(s): GLUCAP in the last 168 hours. Lipid Profile: No results for input(s): CHOL, HDL, LDLCALC, TRIG, CHOLHDL, LDLDIRECT in the last 72 hours. Thyroid Function Tests: No results for input(s): TSH, T4TOTAL, FREET4, T3FREE, THYROIDAB in the last 72 hours. Anemia Panel: No results for input(s): VITAMINB12, FOLATE, FERRITIN, TIBC, IRON, RETICCTPCT in the last 72 hours. Sepsis Labs: No results for input(s): PROCALCITON, LATICACIDVEN in the last 168 hours.  Recent Results (from the past 240 hour(s))  SARS Coronavirus 2 by RT PCR (hospital order, performed in [facility] hospital lab) Nasopharyngeal Nasopharyngeal Swab     Status: None   Collection Time: 12/27/19  4:38 AM   Specimen: Nasopharyngeal Swab  Result Value Ref Range Status   SARS Coronavirus 2 NEGATIVE NEGATIVE Final    Comment: (NOTE) SARS-CoV-2 target nucleic acids are NOT DETECTED. The SARS-CoV-2 RNA is generally detectable in upper and lower respiratory specimens during the acute phase of infection. The lowest concentration of SARS-CoV-2 viral copies this assay can detect is 250 copies / mL. A negative result does not preclude SARS-CoV-2 infection and should not be used as the sole basis for treatment or other patient management decisions.  A negative result may occur  with improper specimen collection / handling, submission of specimen other than nasopharyngeal swab, presence of viral mutation(s) within the areas targeted by this assay, and inadequate number of viral copies (<250 copies / mL). A negative result must be combined with clinical observations, patient history, and epidemiological information. Fact Sheet for Patients:   StrictlyIdeas.no Fact Sheet for Healthcare Providers: BankingDealers.co.za This test is not yet approved or cleared  by the [place] FDA and has been authorized for detection and/or diagnosis of SARS-CoV-2 by FDA under an Emergency Use Authorization (EUA).  This EUA will remain in effect (meaning this test can be used) for the duration of the COVID-19 declaration under Section 564(b)(1) of the Act, 21 U.S.C. section 360bbb-3(b)(1), unless the authorization is terminated or revoked sooner. Performed at [facility], [address], [place], [place] [address]      Radiology Studies:  DG Chest 2 View  Result Date: 12/27/2019 CLINICAL DATA:  Left upper quadrant pain. Vomiting. EXAM: CHEST - 2 VIEW COMPARISON:  Chest radiograph 03/24/2019 FINDINGS: Unchanged heart size and mediastinal contours. Peribronchial thickening. Suspected small left pleural effusion. Subsegmental atelectasis at the bases. No pneumothorax. Surgical hardware in the cervical spine is partially included. IMPRESSION: 1. Mild cardiomegaly. Peribronchial thickening may be bronchitic or congestive. 2. Suspected small left pleural effusion. Electronically Signed   By: Keith Rake M.D.   On: 12/27/2019 02:09   CT Abdomen Pelvis W Contrast  Addendum Date: 12/27/2019   ADDENDUM REPORT: 12/27/2019 03:58 ADDENDUM: These results were called by telephone on 12/27/2019 at 3:58 am to provider JADE SUNG , who verbally acknowledged these results. Electronically Signed   By: Lovena Le M.D.   On: 12/27/2019  03:58   Result Date: 12/27/2019 CLINICAL DATA:  Nausea vomiting, left-sided abdominal pain, recent dialysis 2 days prior EXAM: CT ABDOMEN AND PELVIS WITH CONTRAST TECHNIQUE: Multidetector CT imaging of the abdomen and pelvis was performed using the standard protocol following bolus administration of intravenous contrast. CONTRAST:  160mL OMNIPAQUE IOHEXOL 300 MG/ML  SOLN COMPARISON:  CT 05/01/2018 FINDINGS: Lower chest: Dependent atelectatic changes noted in the lung bases. Chronic left basilar thickening calcified pleural plaque with adjacent rounded atelectasis in the left lower lobe, similar to comparison exam. No new consolidative opacity. Mild cardiomegaly. Dense calcification of the mitral annulus and upon the chordae tendinae. Apical thinning of the left ventricle with calcification may reflect sequela of prior infarct. Coronary artery calcifications are present. No pericardial effusion. Hepatobiliary: There is a diffusely did dysplastic appearance throughout the liver with a markedly nodular liver surface contour, similar to comparison exams. This likely reflect sequela of chronic fibrotic changes of cirrhosis. No focal liver lesion. Mild gallbladder wall thickening is nonspecific in the setting of intrinsic liver disease. No visible calcified gallstones or biliary ductal dilatation. Pancreas: Unremarkable. No pancreatic ductal dilatation or surrounding inflammatory changes. Spleen: Wedge-shaped regions of hypoattenuation in the central spleen and anterior spleen (2/39 for example) have an appearance suggestive of splenic infarct. Splenomegaly is noted. Adrenals/Urinary Tract: Normal adrenal glands. Diffusely atrophic and dysplastic appearance of the kidneys with multiple small cysts of mixed attenuation many of which are unchanged from comparison in 2019. Findings are compatible with chronic dialysis dependence. No hydronephrosis or urolithiasis. No new concerning renal lesion. Urinary bladder is largely  decompressed at the time of exam and therefore poorly evaluated by CT imaging. Diffuse bladder wall thickening is noted. A calcification seen along the and anterior bladder wall is unchanged prior. Stomach/Bowel: Extensive paraesophageal varices. Gastric varices are more mild. Diffuse mild edematous thickening of the small bowel likely related to the moderate volume ascites and or portal enteropathy. A normal appendix is visualized. No colonic dilatation or wall thickening. Moderate stool burden. Minimal rectal wall thickening with extensive perirectal varices as well. Vascular/Lymphatic: Extensive aortoiliac atherosclerosis. No aneurysm or ectasia. The hepatic veins are poorly opacified. Dense calcification in the hepatic veins may suggest some chronic thrombosis. There is recanalization of the umbilical vein as well as caput medusa across the anterior abdomen. There are extensive upper abdominal venous collaterals including esophageal and gastric varices, as above. Evidence of splenorenal shunting is noted. The portal vein and splenic veins are patent. Question a portal venous stent. There is notable hypodense expansile filling defect extending from the left common femoral vein and greater saphenous vein into the iliac vein and IVC which extends to the level of the  confluence with the renal veins. Reproductive: Prostate is unremarkable. Few adjacent varices of the right gonadal vein. Other: Moderate volume ascites. No bowel containing hernia. No abdominopelvic free air, pneumatosis or portal venous gas. Musculoskeletal: Chronic osseous manifestations of hyperparathyroidism/renal osteodystrophy. Prior changes of bilateral sacroiliitis which are progressive from prior studies. Additional erosive changes along the left symphysis pubis are similar to the comparison. No new concerning osseous lesions. IMPRESSION: 1. Wedge-shaped regions of hypoattenuation in the central spleen and anterior spleen have an appearance  suggestive of splenic infarct. 2. Stigmata of cirrhosis and portal hypertension with a markedly nodular and dysplastic appearing liver, extensive venous collateralization through the abdomen, splenomegaly, and moderate volume ascites. 3. Venous collaterals including a recanalized umbilical vein, gastric and esophageal varices, and rectal varices. 4. Calcification in the hepatic vein could reflect sequela of remote or chronic thrombosis, poorly opacified on this exam. 5. There is hypodense filling defect extending from the left lower extremity from the femoral and greater saphenous veins into the common femoral, iliac and IVC which extends to the confluence of the renal arteries. 6. Diffuse mild edematous thickening of the small bowel likely related to the moderate volume ascites and/or portal enteropathy. 7. Diffuse bladder wall thickening, possibly related to intrinsic liver disease. Recommend correlation with urinalysis to exclude cystitis. 8. Chronic osseous manifestations of hyperparathyroidism/renal osteodystrophy. 9. Prior changes of bilateral sacroiliitis which are progressive from prior studies. Additional erosive changes along the left symphysis pubis are similar to the comparison. 10. Chronic left basilar thickening calcified pleural plaque with adjacent rounded atelectasis in the left lower lobe, similar to comparison in 2019. 11. Aortic Atherosclerosis (ICD10-I70.0). Currently attempting to contact the ordering provider with a critical value result. Addendum will be submitted upon case discussion. Electronically Signed: By: Lovena Le M.D. On: 12/27/2019 03:53   CT Angio Chest/Abd/Pel for Dissection W and/or W/WO  Result Date: 12/28/2019 CLINICAL DATA:  Left-sided abdominal pain. EXAM: CT ANGIOGRAPHY CHEST, ABDOMEN AND PELVIS TECHNIQUE: Non-contrast CT of the chest was initially obtained. Multidetector CT imaging through the chest, abdomen and pelvis was performed using the standard protocol during  bolus administration of intravenous contrast. Multiplanar reconstructed images and MIPs were obtained and reviewed to evaluate the vascular anatomy. CONTRAST:  156mL OMNIPAQUE IOHEXOL 350 MG/ML SOLN COMPARISON:  May 01, 2018. FINDINGS: CTA CHEST FINDINGS Cardiovascular: Preferential opacification of the thoracic aorta. No evidence of thoracic aortic aneurysm or dissection. Normal heart size. No pericardial effusion. Large paraesophageal varices are noted with large collateral veins in the posterior mediastinum extending to the azygos vein. Mediastinum/Nodes: No enlarged mediastinal, hilar, or axillary lymph nodes. Thyroid gland, trachea, and esophagus demonstrate no significant findings. Lungs/Pleura: No pneumothorax is noted. Right lung is clear. Mild left posterior basilar subsegmental atelectasis is noted with possible calcified pleural plaque seen posteriorly in the left lung base. Musculoskeletal: Findings consistent with renal osteodystrophy. Probable nondisplaced fracture seen involving lateral portion of left ninth rib. Review of the MIP images confirms the above findings. CTA ABDOMEN AND PELVIS FINDINGS VASCULAR Aorta: Atherosclerosis of thoracic aorta is noted without aneurysm or dissection. Celiac: Patent without evidence of aneurysm, dissection, vasculitis or significant stenosis. SMA: Patent without evidence of aneurysm, dissection, vasculitis or significant stenosis. Renals: Renal arteries bilaterally are severely diminished in caliber consistent with end-stage renal disease. IMA: Patent without evidence of aneurysm, dissection, vasculitis or significant stenosis. Inflow: Patent without evidence of aneurysm, dissection, vasculitis or significant stenosis. Veins: No obvious venous abnormality within the limitations of this arterial phase study. Review  of the MIP images confirms the above findings. NON-VASCULAR Hepatobiliary: Hepatic cirrhosis is again noted. No biliary dilatation is noted. No  cholelithiasis is noted. Pancreas: Unremarkable. No pancreatic ductal dilatation or surrounding inflammatory changes. Spleen: Stable peripheral low density is noted in the spleen which may represent infarction of indeterminate age. Adrenals/Urinary Tract: Adrenal glands appear normal. Severely atrophic kidneys are noted bilaterally consistent with end-stage renal disease. No hydronephrosis or renal obstruction is noted. Urinary bladder is decompressed. Stomach/Bowel: Stomach is within normal limits. Appendix appears normal. No evidence of bowel wall thickening, distention, or inflammatory changes. Enlarged perirectal varices are noted which drain into enlarged left gonadal vein; some of these varices appear to extend into the lumen of the rectum. Lymphatic: No adenopathy is noted. Reproductive: Prostate is unremarkable. Other: Moderate ascites is noted. No hernia is noted. Musculoskeletal: Findings consistent with renal osteodystrophy. Degenerative changes are seen involving the pubic symphysis. No acute abnormality is noted. Review of the MIP images confirms the above findings. IMPRESSION: 1. Atherosclerosis of thoracic and abdominal aorta is noted without aneurysm or dissection. 2. Enlarged perirectal varices are noted which drain into large left gonadal vein; some of these varices appear to extend into the lumen of the rectum. 3. Large paraesophageal varices are noted with large collateral veins in the posterior mediastinum extending to the azygos vein. 4. Hepatic cirrhosis is again noted. 5. Stable peripheral low density is noted in the spleen which may represent infarction of indeterminate age. 6. Severely atrophic kidneys are noted bilaterally consistent with end-stage renal disease. 7. Probable nondisplaced fracture seen involving lateral portion of left ninth rib. 8. Moderate ascites is noted. Electronically Signed   By: Marijo Conception M.D.   On: 12/28/2019 10:02    Scheduled Meds: . Chlorhexidine  Gluconate Cloth  6 each Topical Q0600  . ferric citrate  420 mg Oral TID WC  . lisinopril  20 mg Oral Daily  . nicotine  21 mg Transdermal Daily   Continuous Infusions: . heparin 1,050 Units/hr (12/28/19 1216)     LOS: 1 day   Time spent: 40 minutes.  Lorella Nimrod, MD [facility]  If 7PM-7AM, please contact night-coverage [email or web address]  12/28/2019, 6:20 PM   This record has been created using Systems analyst. Errors have been sought and corrected,but may not always be located. Such creation errors do not reflect on the standard of care.

## 2019-12-28 NOTE — Progress Notes (Signed)
ANTICOAGULATION CONSULT NOTE - Follow up [place] for Heparin Indication: splenic infarct  Allergies  Allergen Reactions  . Betadine [Povidone Iodine] Rash   Patient Measurements: Height: 5\' 5"  (165.1 cm) Weight: 65 kg (143 lb 4.8 oz) IBW/kg (Calculated) : 61.5 HEPARIN DW (KG): 65  Vital Signs: BP: 169/69 (05/26 0530) Pulse Rate: 76 (05/26 0402)  Labs: Recent Labs    12/26/19 2109 12/26/19 2109 12/27/19 0245 12/27/19 1112 12/27/19 1922 12/28/19 0443  HGB 11.2*   < >  --   --  10.8* 10.9*  HCT 33.4*  --   --   --  34.0* 32.6*  PLT 126*  --   --   --  111* 111*  APTT  --   --   --  155*  --   --   LABPROT  --   --  15.6*  --   --   --   INR  --   --  1.3*  --   --   --   HEPARINUNFRC  --   --   --  0.38 0.44 0.34  CREATININE 10.23*  --   --   --   --  6.49*   < > = values in this interval not displayed.    Estimated Creatinine Clearance: 12 mL/min (A) (by C-G formula based on SCr of 6.49 mg/dL (H)).   Medical History: Past Medical History:  Diagnosis Date  . Abdominal pain 08/28/2016  . Acute hyperkalemia 10/28/2016  . Chest pain 04/11/2016  . Chronic combined systolic and diastolic CHF (congestive heart failure) ([place])   . Cirrhosis ([place])   . Dialysis patient ([place])   . ESRD (end stage renal disease) on dialysis ([place]) 01/25/2015  . Fluid overload 04/12/2016  . HTN (hypertension) 10/13/2016  . Hyperkalemia 11/27/2016  . Hypertension   . Mitral regurgitation   . Pulmonary edema 07/03/2016  . Renal disorder   . Renal insufficiency   . Scrotal infection 01/25/2015    Assessment: Heparin initiated by EDP.  No anticoagulants per PTA med list.  Baseline labs ordered.  Pharmacy asked to follow and manage.  5/25 Heparin infusion stared @ 1050 units/hr.  05/25 @ 1112 HL: 0.38. Level is therapeutic x 1.  Goal of Therapy:  Heparin level 0.3-0.7 units/ml Monitor platelets by anticoagulation protocol: Yes   Plan:  05/26 @ 0443 HL: 0.34. Level is  therapeutic x 3. Continue current infusion rate of 1050 units/hr. Recheck HL with AM labs.  Monitor CBC daily while on heparin infusion.   Pharmacy will continue to follow   Ena Dawley, PharmD Clinical Pharmacist 12/28/2019 6:03 AM

## 2019-12-29 ENCOUNTER — Inpatient Hospital Stay
Admit: 2019-12-29 | Discharge: 2019-12-29 | Disposition: A | Discharge status: Home / Self Care | Payer: Self-pay | Attending: Internal Medicine | Admitting: Internal Medicine

## 2019-12-29 ENCOUNTER — Encounter: Payer: Self-pay | Admitting: Internal Medicine

## 2019-12-29 LAB — CBC
HCT: 33.2 % — ABNORMAL LOW (ref 39.0–52.0)
Hemoglobin: 10.7 g/dL — ABNORMAL LOW (ref 13.0–17.0)
MCH: 31.9 pg (ref 26.0–34.0)
MCHC: 32.2 g/dL (ref 30.0–36.0)
MCV: 99.1 fL (ref 80.0–100.0)
Platelets: 106 10*3/uL — ABNORMAL LOW (ref 150–400)
RBC: 3.35 MIL/uL — ABNORMAL LOW (ref 4.22–5.81)
RDW: 14.4 % (ref 11.5–15.5)
WBC: 4 10*3/uL (ref 4.0–10.5)
nRBC: 0 % (ref 0.0–0.2)

## 2019-12-29 LAB — PROTEIN S, TOTAL: Protein S Ag, Total: 96 % (ref 60–150)

## 2019-12-29 LAB — PROTEIN S ACTIVITY: Protein S Activity: 84 % (ref 63–140)

## 2019-12-29 LAB — GLUCOSE, CAPILLARY
Glucose-Capillary: 66 mg/dL — ABNORMAL LOW (ref 70–99)
Glucose-Capillary: 94 mg/dL (ref 70–99)

## 2019-12-29 LAB — ECHOCARDIOGRAM COMPLETE: Weight: 2292.78 oz

## 2019-12-29 LAB — BETA-2-GLYCOPROTEIN I ABS, IGG/M/A
Beta-2 Glyco I IgG: <9 GPI IgG units (ref 0–20)
Beta-2-Glycoprotein I IgA: 30 GPI IgA units — ABNORMAL HIGH (ref 0–25)
Beta-2-Glycoprotein I IgM: 19 GPI IgM units (ref 0–32)

## 2019-12-29 LAB — PROTEIN C ACTIVITY: Protein C Activity: 72 % — ABNORMAL LOW (ref 73–180)

## 2019-12-29 LAB — HEPARIN LEVEL (UNFRACTIONATED): Heparin Unfractionated: 0.37 IU/mL (ref 0.30–0.70)

## 2019-12-29 LAB — PROTEIN C, TOTAL: Protein C, Total: 73 % (ref 60–150)

## 2019-12-29 MED ORDER — IRBESARTAN 150 MG PO TABS
300.0000 mg | ORAL_TABLET | Freq: Every day | ORAL | Status: DC
  Administered 2019-12-29 – 2019-12-30 (×2): 300 mg via ORAL
  Filled 2019-12-29 (×2): qty 2

## 2019-12-29 NOTE — Progress Notes (Signed)
ANTICOAGULATION CONSULT NOTE - Follow up [place] for Heparin Indication: splenic infarct  Allergies  Allergen Reactions  . Betadine [Povidone Iodine] Rash   Patient Measurements: Height: 5\' 5"  (165.1 cm) Weight: 65 kg (143 lb 4.8 oz) IBW/kg (Calculated) : 61.5 HEPARIN DW (KG): 65  Vital Signs: Temp: 98.4 F (36.9 C) (05/27 0514) Temp Source: Oral (05/27 0514) BP: 135/92 (05/27 0514) Pulse Rate: 70 (05/27 0514)  Labs: Recent Labs    12/26/19 2109 12/26/19 2109 12/27/19 0245 12/27/19 1112 12/27/19 1922 12/27/19 1922 12/28/19 0443 12/29/19 0449  HGB 11.2*   < >  --   --  10.8*   < > 10.9* 10.7*  HCT 33.4*   < >  --   --  34.0*  --  32.6* 33.2*  PLT 126*   < >  --   --  111*  --  111* 106*  APTT  --   --   --  155*  --   --   --   --   LABPROT  --   --  15.6*  --   --   --   --   --   INR  --   --  1.3*  --   --   --   --   --   HEPARINUNFRC  --    < >  --  0.38 0.44  --  0.34 0.37  CREATININE 10.23*  --   --   --   --   --  6.49*  --    < > = values in this interval not displayed.    Estimated Creatinine Clearance (by C-G formula based on SCr of 6.49 mg/dL (H)) Male: 9.4 mL/min (A) Male: 12 mL/min (A)   Medical History: Past Medical History:  Diagnosis Date  . Abdominal pain 08/28/2016  . Acute hyperkalemia 10/28/2016  . Chest pain 04/11/2016  . Chronic combined systolic and diastolic CHF (congestive heart failure) ([place])   . Cirrhosis ([place])   . Dialysis patient ([place])   . ESRD (end stage renal disease) on dialysis ([place]) 01/25/2015  . Fluid overload 04/12/2016  . HTN (hypertension) 10/13/2016  . Hyperkalemia 11/27/2016  . Hypertension   . Mitral regurgitation   . Pulmonary edema 07/03/2016  . Renal disorder   . Renal insufficiency   . Scrotal infection 01/25/2015    Assessment: Heparin initiated by EDP.  No anticoagulants per PTA med list.  Baseline labs ordered.  Pharmacy asked to follow and manage.  5/25 Heparin infusion stared @ 1050  units/hr.  05/25 @ 1112 HL: 0.38. Level is therapeutic x 1.  Goal of Therapy:  Heparin level 0.3-0.7 units/ml Monitor platelets by anticoagulation protocol: Yes   Plan:  05/27 @ 0449 HL: 0.37. Level is therapeutic x 4.  CBC stable Continue current infusion rate of 1050 units/hr. Recheck HL with AM labs.  Monitor CBC daily while on heparin infusion.   Pharmacy will continue to follow   Ena Dawley, PharmD Clinical Pharmacist 12/29/2019 6:48 AM

## 2019-12-29 NOTE — Progress Notes (Signed)
PROGRESS NOTE    Mario [name]  LOV:[id number] DOB: Nov 13, 1970 DOA: 12/27/2019 PCP: [name], MD   Brief Narrative:  Mario [name] is a 49 y.o. male with medical history significant of hypertension, ESRD-HD (TTS), liver cirrhosis, SBP, CHF, anemia, tobacco abuse, mitral valve prolapse, who presents with left upper quadrant abdominal pain and left flank pain. Found to have splenic infarct.  Started on heparin infusion and vascular surgery was consulted.  Subjective: Patient was complaining of left upper quadrant and flank pain.  Denies any nausea or vomiting.  Assessment & Plan:   Principal Problem:   Splenic infarction Active Problems:   ESRD (end stage renal disease) on dialysis (HCC)   Chronic diastolic (congestive) heart failure (HCC)   Cirrhosis (HCC)   HTN (hypertension)   Tobacco abuse   Anemia in ESRD (end-stage renal disease) (HCC)  Splenic infarction: Vascular surgery, Dr. Delana Meyer is consulted. Patient underwent CTA of chest and abdomen today, they did not find any vascular cause for his splenic infarct and they are recommending cardiology consult. -cardiology was consulted today-pending recommendations. -Continue Heparin infusion. -Continue pain management. -Hypercoagulable panel drawn-Antithrombin III mildly decreased at 70, cardiolipin antibodies negative.  Homocysteine borderline elevated at 26.2 and beta-2 glycoprotein IgA borderline positive. -Monitor any signs of spleen rupture  ESRD (end stage renal disease) on dialysis (TTS) -Dr. Candiss Norse of renal is consulted  Chronic diastolic (congestive) heart failure ([place]): 2D echo on 04/03/2019 showed EF of 60-65%.  Patient does not have leg edema.  No respiratory distress.  CHF seem to be compensated. -Volume management per renal by dialysis.  Liver Cirrhosis ([place]): Mental status normal. -check INR and PTT -check ammonia level  HTN:  -Continue home medications: Amlodipine and  lisinopril -hydralazine prn  Tobacco abuse -nicotine patch  Anemia in ESRD (end-stage renal disease) ([place]): Hgb stable, 11.2 -f/u by CBC  Objective: Vitals:   12/29/19 1115 12/29/19 1130 12/29/19 1145 12/29/19 1600  BP: (!) 184/75 (!) 179/71 (!) 183/75 (!) 167/77  Pulse: 66 65 66 71  Resp: 15 15 15 18   Temp:    98.4 F (36.9 C)  TempSrc:    Oral  SpO2:    100%  Weight:      Height:        Intake/Output Summary (Last 24 hours) at 12/29/2019 1917 Last data filed at 12/29/2019 1915 Gross per 24 hour  Intake 600 ml  Output 0 ml  Net 600 ml   Filed Weights   12/26/19 2107 12/27/19 1230  Weight: 65 kg 65 kg    Examination:  General exam: Appears calm and comfortable  Respiratory system: Clear to auscultation. Respiratory effort normal. Cardiovascular system: S1 & S2 heard, RRR. No JVD, murmurs, rubs, gallops or clicks. Gastrointestinal system: Soft, nontender, nondistended, bowel sounds positive. Central nervous system: Alert and oriented. No focal neurological deficits.Symmetric 5 x 5 power. Extremities: No edema, no cyanosis, pulses intact and symmetrical.  Large left upper extremity fistula. Psychiatry: Judgement and insight appear normal.   DVT prophylaxis: Heparin Code Status: Full Family Communication: Discussed with patient  disposition Plan:  Status is: Inpatient   Remains inpatient appropriate because:Inpatient level of care appropriate due to severity of illness   Dispo: The patient is from: Home              Anticipated d/c is to: Home              Anticipated d/c date is: 2 days  Patient currently is not medically stable to d/c.   Consultants:   Vascular surgery  Cardiology  Procedures:  Antimicrobials:   Data Reviewed: I have personally reviewed following labs and imaging studies  CBC: Recent Labs  Lab 12/26/19 2109 12/27/19 1922 12/28/19 0443 12/29/19 0449  WBC 6.4 4.4 4.5 4.0  HGB 11.2* 10.8* 10.9* 10.7*  HCT 33.4*  34.0* 32.6* 33.2*  MCV 96.3 99.1 97.0 99.1  PLT 126* 111* 111* 814*   Basic Metabolic Panel: Recent Labs  Lab 12/26/19 2109 12/27/19 1253 12/28/19 0443  NA 136  --  137  K 4.8  --  4.9  CL 92*  --  97*  CO2 26  --  28  GLUCOSE 99  --  79  BUN 49*  --  25*  CREATININE 10.23*  --  6.49*  CALCIUM 7.7*  --  8.4*  PHOS  --  6.5*  --    GFR: Estimated Creatinine Clearance (by C-G formula based on SCr of 6.49 mg/dL (H)) Male: 9.4 mL/min (A) Male: 12 mL/min (A) Liver Function Tests: Recent Labs  Lab 12/27/19 0245  AST 25  ALT 15  ALKPHOS 163*  BILITOT 1.1  PROT 7.1  ALBUMIN 3.6   Recent Labs  Lab 12/26/19 2109  LIPASE 36   Recent Labs  Lab 12/27/19 1112  AMMONIA 39*   Coagulation Profile: Recent Labs  Lab 12/27/19 0245  INR 1.3*   Cardiac Enzymes: No results for input(s): CKTOTAL, CKMB, CKMBINDEX, TROPONINI in the last 168 hours. BNP (last 3 results) No results for input(s): PROBNP in the last 8760 hours. HbA1C: No results for input(s): HGBA1C in the last 72 hours. CBG: Recent Labs  Lab 12/29/19 0804 12/29/19 0851  GLUCAP 66* 94   Lipid Profile: No results for input(s): CHOL, HDL, LDLCALC, TRIG, CHOLHDL, LDLDIRECT in the last 72 hours. Thyroid Function Tests: No results for input(s): TSH, T4TOTAL, FREET4, T3FREE, THYROIDAB in the last 72 hours. Anemia Panel: No results for input(s): VITAMINB12, FOLATE, FERRITIN, TIBC, IRON, RETICCTPCT in the last 72 hours. Sepsis Labs: No results for input(s): PROCALCITON, LATICACIDVEN in the last 168 hours.  Recent Results (from the past 240 hour(s))  SARS Coronavirus 2 by RT PCR (hospital order, performed in [facility] hospital lab) Nasopharyngeal Nasopharyngeal Swab     Status: None   Collection Time: 12/27/19  4:38 AM   Specimen: Nasopharyngeal Swab  Result Value Ref Range Status   SARS Coronavirus 2 NEGATIVE NEGATIVE Final    Comment: (NOTE) SARS-CoV-2 target nucleic acids are NOT DETECTED. The  SARS-CoV-2 RNA is generally detectable in upper and lower respiratory specimens during the acute phase of infection. The lowest concentration of SARS-CoV-2 viral copies this assay can detect is 250 copies / mL. A negative result does not preclude SARS-CoV-2 infection and should not be used as the sole basis for treatment or other patient management decisions.  A negative result may occur with improper specimen collection / handling, submission of specimen other than nasopharyngeal swab, presence of viral mutation(s) within the areas targeted by this assay, and inadequate number of viral copies (<250 copies / mL). A negative result must be combined with clinical observations, patient history, and epidemiological information. Fact Sheet for Patients:   StrictlyIdeas.no Fact Sheet for Healthcare Providers: BankingDealers.co.za This test is not yet approved or cleared  by the [place] FDA and has been authorized for detection and/or diagnosis of SARS-CoV-2 by FDA under an Emergency Use Authorization (EUA).  This EUA will remain  in effect (meaning this test can be used) for the duration of the COVID-19 declaration under Section 564(b)(1) of the Act, 21 U.S.C. section 360bbb-3(b)(1), unless the authorization is terminated or revoked sooner. Performed at [facility], [address], [place], [place] [address]      Radiology Studies: ECHOCARDIOGRAM COMPLETE  Result Date: 12/29/2019    ECHOCARDIOGRAM REPORT   Patient Name:   MURIEL WILBER Date of Exam: 12/29/2019 Medical Rec #:  [id number]                Height:       65.0 in Accession #:    [id number]               Weight:       143.3 lb Date of Birth:  Jun 30, 1971               BSA:          1.717 m Patient Age:    16 years                 BP:           183/75 mmHg Patient Gender: M                        HR:           68 bpm. Exam Location:  [facility] Procedure: 2D Echo, Color  Doppler and Cardiac Doppler Indications:     R06.00 Dyspnea  History:         Patient has prior history of Echocardiogram examinations. CHF,                  ESRD; Risk Factors:Hypertension.  Sonographer:     Charmayne Sheer RDCS (AE) Referring Phys:  [id number] [facility] Marquail Bradwell Diagnosing Phys: Serafina Royals MD IMPRESSIONS  1. Left ventricular ejection fraction, by estimation, is 55 to 60%. The left ventricle has normal function. The left ventricle has no regional wall motion abnormalities. Left ventricular diastolic parameters were normal.  2. Right ventricular systolic function is normal. The right ventricular size is normal.  3. Left atrial size was mildly dilated.  4. The mitral valve is myxomatous. Trivial mitral valve regurgitation. Mild mitral stenosis.  5. The aortic valve is normal in structure. Aortic valve regurgitation is trivial. FINDINGS  Left Ventricle: Left ventricular ejection fraction, by estimation, is 55 to 60%. The left ventricle has normal function. The left ventricle has no regional wall motion abnormalities. The left ventricular internal cavity size was normal in size. There is  no left ventricular hypertrophy. Left ventricular diastolic parameters were normal. Right Ventricle: The right ventricular size is normal. No increase in right ventricular wall thickness. Right ventricular systolic function is normal. Left Atrium: Left atrial size was mildly dilated. Right Atrium: Right atrial size was normal in size. Pericardium: There is no evidence of pericardial effusion. Mitral Valve: The mitral valve is myxomatous. Trivial mitral valve regurgitation. Mild mitral valve stenosis. MV peak gradient, 18.8 mmHg. The mean mitral valve gradient is 9.0 mmHg. Tricuspid Valve: The tricuspid valve is normal in structure. Tricuspid valve regurgitation is trivial. Aortic Valve: The aortic valve is normal in structure. Aortic valve regurgitation is trivial. Aortic valve mean gradient measures 15.0 mmHg. Aortic valve  peak gradient measures 33.0 mmHg. Aortic valve area, by VTI measures 1.94 cm. Pulmonic Valve: The pulmonic valve was normal in structure. Pulmonic valve regurgitation is not visualized. Aorta: The aortic root  and ascending aorta are structurally normal, with no evidence of dilitation. IAS/Shunts: No atrial level shunt detected by color flow Doppler.  LEFT VENTRICLE PLAX 2D LVIDd:         5.87 cm  Diastology LVIDs:         3.31 cm  LV e' lateral:   9.14 cm/s LV PW:         1.06 cm  LV E/e' lateral: 19.8 LV IVS:        0.69 cm  LV e' medial:    5.98 cm/s LVOT diam:     2.00 cm  LV E/e' medial:  30.3 LV SV:         103 LV SV Index:   60 LVOT Area:     3.14 cm  RIGHT VENTRICLE RV Basal diam:  3.25 cm LEFT ATRIUM           Index       RIGHT ATRIUM           Index LA diam:      4.10 cm 2.39 cm/m  RA Area:     17.10 cm LA Vol (A2C): 48.9 ml 28.48 ml/m RA Volume:   46.90 ml  27.32 ml/m LA Vol (A4C): 42.3 ml 24.64 ml/m  AORTIC VALVE                    PULMONIC VALVE AV Area (Vmax):    1.85 cm     PV Vmax:       2.11 m/s AV Area (Vmean):   1.80 cm     PV Vmean:      119.000 cm/s AV Area (VTI):     1.94 cm     PV VTI:        0.380 m AV Vmax:           287.33 cm/s  PV Peak grad:  17.8 mmHg AV Vmean:          176.333 cm/s PV Mean grad:  7.0 mmHg AV VTI:            0.534 m AV Peak Grad:      33.0 mmHg AV Mean Grad:      15.0 mmHg LVOT Vmax:         169.00 cm/s LVOT Vmean:        101.000 cm/s LVOT VTI:          0.329 m LVOT/AV VTI ratio: 0.62  AORTA Ao Root diam: 3.00 cm MITRAL VALVE MV Area (PHT): 2.72 cm     SHUNTS MV Peak grad:  18.8 mmHg    Systemic VTI:  0.33 m MV Mean grad:  9.0 mmHg     Systemic Diam: 2.00 cm MV Vmax:       2.17 m/s MV Vmean:      143.0 cm/s MV Decel Time: 279 msec MV E velocity: 181.00 cm/s MV A velocity: 138.00 cm/s MV E/A ratio:  1.31 Serafina Royals MD Electronically signed by Serafina Royals MD Signature Date/Time: 12/29/2019/5:31:52 PM    Final    CT Angio Chest/Abd/Pel for Dissection W  and/or W/WO  Result Date: 12/28/2019 CLINICAL DATA:  Left-sided abdominal pain. EXAM: CT ANGIOGRAPHY CHEST, ABDOMEN AND PELVIS TECHNIQUE: Non-contrast CT of the chest was initially obtained. Multidetector CT imaging through the chest, abdomen and pelvis was performed using the standard protocol during bolus administration of intravenous contrast. Multiplanar reconstructed images and MIPs were obtained and reviewed to evaluate the vascular anatomy. CONTRAST:  171mL OMNIPAQUE IOHEXOL 350 MG/ML SOLN COMPARISON:  May 01, 2018. FINDINGS: CTA CHEST FINDINGS Cardiovascular: Preferential opacification of the thoracic aorta. No evidence of thoracic aortic aneurysm or dissection. Normal heart size. No pericardial effusion. Large paraesophageal varices are noted with large collateral veins in the posterior mediastinum extending to the azygos vein. Mediastinum/Nodes: No enlarged mediastinal, hilar, or axillary lymph nodes. Thyroid gland, trachea, and esophagus demonstrate no significant findings. Lungs/Pleura: No pneumothorax is noted. Right lung is clear. Mild left posterior basilar subsegmental atelectasis is noted with possible calcified pleural plaque seen posteriorly in the left lung base. Musculoskeletal: Findings consistent with renal osteodystrophy. Probable nondisplaced fracture seen involving lateral portion of left ninth rib. Review of the MIP images confirms the above findings. CTA ABDOMEN AND PELVIS FINDINGS VASCULAR Aorta: Atherosclerosis of thoracic aorta is noted without aneurysm or dissection. Celiac: Patent without evidence of aneurysm, dissection, vasculitis or significant stenosis. SMA: Patent without evidence of aneurysm, dissection, vasculitis or significant stenosis. Renals: Renal arteries bilaterally are severely diminished in caliber consistent with end-stage renal disease. IMA: Patent without evidence of aneurysm, dissection, vasculitis or significant stenosis. Inflow: Patent without evidence  of aneurysm, dissection, vasculitis or significant stenosis. Veins: No obvious venous abnormality within the limitations of this arterial phase study. Review of the MIP images confirms the above findings. NON-VASCULAR Hepatobiliary: Hepatic cirrhosis is again noted. No biliary dilatation is noted. No cholelithiasis is noted. Pancreas: Unremarkable. No pancreatic ductal dilatation or surrounding inflammatory changes. Spleen: Stable peripheral low density is noted in the spleen which may represent infarction of indeterminate age. Adrenals/Urinary Tract: Adrenal glands appear normal. Severely atrophic kidneys are noted bilaterally consistent with end-stage renal disease. No hydronephrosis or renal obstruction is noted. Urinary bladder is decompressed. Stomach/Bowel: Stomach is within normal limits. Appendix appears normal. No evidence of bowel wall thickening, distention, or inflammatory changes. Enlarged perirectal varices are noted which drain into enlarged left gonadal vein; some of these varices appear to extend into the lumen of the rectum. Lymphatic: No adenopathy is noted. Reproductive: Prostate is unremarkable. Other: Moderate ascites is noted. No hernia is noted. Musculoskeletal: Findings consistent with renal osteodystrophy. Degenerative changes are seen involving the pubic symphysis. No acute abnormality is noted. Review of the MIP images confirms the above findings. IMPRESSION: 1. Atherosclerosis of thoracic and abdominal aorta is noted without aneurysm or dissection. 2. Enlarged perirectal varices are noted which drain into large left gonadal vein; some of these varices appear to extend into the lumen of the rectum. 3. Large paraesophageal varices are noted with large collateral veins in the posterior mediastinum extending to the azygos vein. 4. Hepatic cirrhosis is again noted. 5. Stable peripheral low density is noted in the spleen which may represent infarction of indeterminate age. 6. Severely atrophic  kidneys are noted bilaterally consistent with end-stage renal disease. 7. Probable nondisplaced fracture seen involving lateral portion of left ninth rib. 8. Moderate ascites is noted. Electronically Signed   By: Marijo Conception M.D.   On: 12/28/2019 10:02    Scheduled Meds: . Chlorhexidine Gluconate Cloth  6 each Topical Q0600  . ferric citrate  420 mg Oral TID WC  . irbesartan  300 mg Oral QHS  . nicotine  21 mg Transdermal Daily   Continuous Infusions: . heparin 1,050 Units/hr (12/28/19 1216)     LOS: 2 days   Time spent: 40 minutes.  Lorella Nimrod, MD [facility]  If 7PM-7AM, please contact night-coverage [email or web address]  12/29/2019, 7:17 PM   This record has been created  using Systems analyst. Errors have been sought and corrected,but may not always be located. Such creation errors do not reflect on the standard of care.

## 2019-12-29 NOTE — Progress Notes (Signed)
[facility], [place] 12/29/19  Subjective:   LOS: 2 Patient presented to the emergency room for left flank pain and abdominal pain.  States pain has been for about 3 to 5 days. No fevers or chills No leg edema Blood pressure150s to 180s Ate 100% of his breakfast today    Objective:  Vital signs in last 24 hours:  Temp:  [98.4 F (36.9 C)-98.7 F (37.1 C)] 98.4 F (36.9 C) (05/27 1105) Pulse Rate:  [65-74] 66 (05/27 1145) Resp:  [15-23] 15 (05/27 1145) BP: (135-189)/(71-92) 183/75 (05/27 1145) SpO2:  [97 %-100 %] 100 % (05/27 1105)  Weight change:  Filed Weights   12/26/19 2107 12/27/19 1230  Weight: 65 kg 65 kg    Intake/Output:    Intake/Output Summary (Last 24 hours) at 12/29/2019 1435 Last data filed at 12/29/2019 1039 Gross per 24 hour  Intake 120 ml  Output 0 ml  Net 120 ml     Physical Exam: General:  Chronically ill-appearing, laying in the bed  HEENT  moist oral mucous membrane  Pulm/lungs  normal breathing effort on room air  CVS/Heart  no rub or gallop  Abdomen:   Soft, mild left abdominal tenderness  Extremities:  Trace edema  Neurologic:  Alert, able to follow commands  Skin:  Warm, dry  Access:  Left arm aneurysmal fistula       Basic Metabolic Panel:  Recent Labs  Lab 12/26/19 2109 12/27/19 1253 12/28/19 0443  NA 136  --  137  K 4.8  --  4.9  CL 92*  --  97*  CO2 26  --  28  GLUCOSE 99  --  79  BUN 49*  --  25*  CREATININE 10.23*  --  6.49*  CALCIUM 7.7*  --  8.4*  PHOS  --  6.5*  --      CBC: Recent Labs  Lab 12/26/19 2109 12/27/19 1922 12/28/19 0443 12/29/19 0449  WBC 6.4 4.4 4.5 4.0  HGB 11.2* 10.8* 10.9* 10.7*  HCT 33.4* 34.0* 32.6* 33.2*  MCV 96.3 99.1 97.0 99.1  PLT 126* 111* 111* 106*      Lab Results  Component Value Date   HEPBSAG NON REACTIVE 12/27/2019   HEPBSAB Reactive (A) 12/27/2019   HEPBIGM Negative 05/02/2018      Microbiology:  Recent Results (from the past 240  hour(s))  SARS Coronavirus 2 by RT PCR (hospital order, performed in [place] hospital lab) Nasopharyngeal Nasopharyngeal Swab     Status: None   Collection Time: 12/27/19  4:38 AM   Specimen: Nasopharyngeal Swab  Result Value Ref Range Status   SARS Coronavirus 2 NEGATIVE NEGATIVE Final    Comment: (NOTE) SARS-CoV-2 target nucleic acids are NOT DETECTED. The SARS-CoV-2 RNA is generally detectable in upper and lower respiratory specimens during the acute phase of infection. The lowest concentration of SARS-CoV-2 viral copies this assay can detect is 250 copies / mL. A negative result does not preclude SARS-CoV-2 infection and should not be used as the sole basis for treatment or other patient management decisions.  A negative result may occur with improper specimen collection / handling, submission of specimen other than nasopharyngeal swab, presence of viral mutation(s) within the areas targeted by this assay, and inadequate number of viral copies (<250 copies / mL). A negative result must be combined with clinical observations, patient history, and epidemiological information. Fact Sheet for Patients:   StrictlyIdeas.no Fact Sheet for Healthcare Providers: BankingDealers.co.za This test is  not yet approved or cleared  by the [place] and has been authorized for detection and/or diagnosis of SARS-CoV-2 by FDA under an Emergency Use Authorization (EUA).  This EUA will remain in effect (meaning this test can be used) for the duration of the COVID-19 declaration under Section 564(b)(1) of the Act, 21 U.S.C. section 360bbb-3(b)(1), unless the authorization is terminated or revoked sooner. Performed at [facility], [address] [place], [place] [address]     Coagulation Studies: Recent Labs    12/27/19 0245  LABPROT 15.6*  INR 1.3*    Urinalysis: No results for input(s): COLORURINE, LABSPEC, PHURINE,  GLUCOSEU, HGBUR, BILIRUBINUR, KETONESUR, PROTEINUR, UROBILINOGEN, NITRITE, LEUKOCYTESUR in the last 72 hours.  Invalid input(s): APPERANCEUR    Imaging: CT Angio Chest/Abd/Pel for Dissection W and/or W/WO  Result Date: 12/28/2019 CLINICAL DATA:  Left-sided abdominal pain. EXAM: CT ANGIOGRAPHY CHEST, ABDOMEN AND PELVIS TECHNIQUE: Non-contrast CT of the chest was initially obtained. Multidetector CT imaging through the chest, abdomen and pelvis was performed using the standard protocol during bolus administration of intravenous contrast. Multiplanar reconstructed images and MIPs were obtained and reviewed to evaluate the vascular anatomy. CONTRAST:  188mL OMNIPAQUE IOHEXOL 350 MG/ML SOLN COMPARISON:  May 01, 2018. FINDINGS: CTA CHEST FINDINGS Cardiovascular: Preferential opacification of the thoracic aorta. No evidence of thoracic aortic aneurysm or dissection. Normal heart size. No pericardial effusion. Large paraesophageal varices are noted with large collateral veins in the posterior mediastinum extending to the azygos vein. Mediastinum/Nodes: No enlarged mediastinal, hilar, or axillary lymph nodes. Thyroid gland, trachea, and esophagus demonstrate no significant findings. Lungs/Pleura: No pneumothorax is noted. Right lung is clear. Mild left posterior basilar subsegmental atelectasis is noted with possible calcified pleural plaque seen posteriorly in the left lung base. Musculoskeletal: Findings consistent with renal osteodystrophy. Probable nondisplaced fracture seen involving lateral portion of left ninth rib. Review of the MIP images confirms the above findings. CTA ABDOMEN AND PELVIS FINDINGS VASCULAR Aorta: Atherosclerosis of thoracic aorta is noted without aneurysm or dissection. Celiac: Patent without evidence of aneurysm, dissection, vasculitis or significant stenosis. SMA: Patent without evidence of aneurysm, dissection, vasculitis or significant stenosis. Renals: Renal arteries bilaterally  are severely diminished in caliber consistent with end-stage renal disease. IMA: Patent without evidence of aneurysm, dissection, vasculitis or significant stenosis. Inflow: Patent without evidence of aneurysm, dissection, vasculitis or significant stenosis. Veins: No obvious venous abnormality within the limitations of this arterial phase study. Review of the MIP images confirms the above findings. NON-VASCULAR Hepatobiliary: Hepatic cirrhosis is again noted. No biliary dilatation is noted. No cholelithiasis is noted. Pancreas: Unremarkable. No pancreatic ductal dilatation or surrounding inflammatory changes. Spleen: Stable peripheral low density is noted in the spleen which may represent infarction of indeterminate age. Adrenals/Urinary Tract: Adrenal glands appear normal. Severely atrophic kidneys are noted bilaterally consistent with end-stage renal disease. No hydronephrosis or renal obstruction is noted. Urinary bladder is decompressed. Stomach/Bowel: Stomach is within normal limits. Appendix appears normal. No evidence of bowel wall thickening, distention, or inflammatory changes. Enlarged perirectal varices are noted which drain into enlarged left gonadal vein; some of these varices appear to extend into the lumen of the rectum. Lymphatic: No adenopathy is noted. Reproductive: Prostate is unremarkable. Other: Moderate ascites is noted. No hernia is noted. Musculoskeletal: Findings consistent with renal osteodystrophy. Degenerative changes are seen involving the pubic symphysis. No acute abnormality is noted. Review of the MIP images confirms the above findings. IMPRESSION: 1. Atherosclerosis of thoracic and abdominal aorta is noted without aneurysm  or dissection. 2. Enlarged perirectal varices are noted which drain into large left gonadal vein; some of these varices appear to extend into the lumen of the rectum. 3. Large paraesophageal varices are noted with large collateral veins in the posterior  mediastinum extending to the azygos vein. 4. Hepatic cirrhosis is again noted. 5. Stable peripheral low density is noted in the spleen which may represent infarction of indeterminate age. 6. Severely atrophic kidneys are noted bilaterally consistent with end-stage renal disease. 7. Probable nondisplaced fracture seen involving lateral portion of left ninth rib. 8. Moderate ascites is noted. Electronically Signed   By: Marijo Conception M.D.   On: 12/28/2019 10:02     Medications:   . heparin 1,050 Units/hr (12/28/19 1216)   . Chlorhexidine Gluconate Cloth  6 each Topical Q0600  . ferric citrate  420 mg Oral TID WC  . lisinopril  20 mg Oral Daily  . nicotine  21 mg Transdermal Daily   albuterol, dextromethorphan-guaiFENesin, hydrALAZINE, hydrOXYzine, iohexol, morphine injection, oxyCODONE  Assessment/ Plan:  49 y.o. adult with  was admitted on 12/27/2019 for  Principal Problem:   Splenic infarction Active Problems:   ESRD (end stage renal disease) on dialysis (HCC)   Chronic diastolic (congestive) heart failure (HCC)   Cirrhosis (HCC)   HTN (hypertension)   Tobacco abuse   Anemia in ESRD (end-stage renal disease) ([place])  Splenic infarction [D73.5] Thromboembolism ([place]) [I74.9] Splenic infarct [D73.5] ESRD (end stage renal disease) on dialysis ([place]) [N18.6, Z99.2] Abdominal pain, unspecified abdominal location [R10.9] Non-intractable vomiting with nausea, unspecified vomiting type [R11.2]  #. ESRD [employer] [facility]/TTS/[employer] weight 65 kg/[facility] neph[facility] We will arrange for hemodialysis on his usual schedule while he is in the hospital Prefer standing weights   #. Anemia of CKD  Lab Results  Component Value Date   HGB 10.7 (L) 12/29/2019   Low dose EPO with HD once hemoglobin less than 11  #.Secondary hyperparathyroidism of renal origin N 25.81      Component Value Date/Time   PTH [facility] 04/12/2019 1349   Lab Results  Component Value Date   PHOS 6.5 (H)  12/27/2019   Monitor calcium and phos level during this admission  #Left upper quadrant abdominal pain, splenic infarction IV heparin Pain control Vascular surgery evaluation in progress  #Hypertension Blood pressure in the 160s to 180s today.  Will discontinue lisinopril as ACE inhibitors can be dialyzed out.  We will place him on ARB irbesartan and dose it at night so it does not have to be held in the morning.    LOS: [place] 5/27/20212:35 PM  [place], [place]

## 2019-12-29 NOTE — Progress Notes (Signed)
*  PRELIMINARY RESULTS* Echocardiogram 2D Echocardiogram has been performed.  Mario [name] 12/29/2019, 3:36 PM

## 2019-12-30 DIAGNOSIS — D735 Infarction of spleen: Principal | ICD-10-CM

## 2019-12-30 LAB — LUPUS ANTICOAGULANT PANEL
DRVVT: 68.3 s — ABNORMAL HIGH (ref 0.0–47.0)
PTT Lupus Anticoagulant: 57.9 s — ABNORMAL HIGH (ref 0.0–51.9)

## 2019-12-30 LAB — CBC
HCT: 33.5 % — ABNORMAL LOW (ref 39.0–52.0)
Hemoglobin: 11.1 g/dL — ABNORMAL LOW (ref 13.0–17.0)
MCH: 32.1 pg (ref 26.0–34.0)
MCHC: 33.1 g/dL (ref 30.0–36.0)
MCV: 96.8 fL (ref 80.0–100.0)
Platelets: 106 10*3/uL — ABNORMAL LOW (ref 150–400)
RBC: 3.46 MIL/uL — ABNORMAL LOW (ref 4.22–5.81)
RDW: 14.4 % (ref 11.5–15.5)
WBC: 4.3 10*3/uL (ref 4.0–10.5)
nRBC: 0 % (ref 0.0–0.2)

## 2019-12-30 LAB — GLUCOSE, CAPILLARY
Glucose-Capillary: 68 mg/dL — ABNORMAL LOW (ref 70–99)
Glucose-Capillary: 113 mg/dL — ABNORMAL HIGH (ref 70–99)

## 2019-12-30 LAB — HEPARIN LEVEL (UNFRACTIONATED)
Heparin Unfractionated: 0.25 IU/mL — ABNORMAL LOW (ref 0.30–0.70)
Heparin Unfractionated: 0.39 IU/mL (ref 0.30–0.70)
Heparin Unfractionated: 0.43 IU/mL (ref 0.30–0.70)

## 2019-12-30 LAB — PTT-LA MIX: PTT-LA Mix: 48.5 s (ref 0.0–48.9)

## 2019-12-30 LAB — DRVVT CONFIRM: dRVVT Confirm: 1.2 ratio (ref 0.8–1.2)

## 2019-12-30 LAB — PTT-LA INCUB MIX: PTT-LA Incub Mix: 50.3 s — ABNORMAL HIGH (ref 0.0–48.9)

## 2019-12-30 LAB — DRVVT MIX: dRVVT Mix: 42.4 s — ABNORMAL HIGH (ref 0.0–40.4)

## 2019-12-30 LAB — FACTOR 5 LEIDEN

## 2019-12-30 LAB — HEXAGONAL PHASE PHOSPHOLIPID: Hexagonal Phase Phospholipid: 0 s (ref 0–11)

## 2019-12-30 LAB — PROTHROMBIN GENE MUTATION

## 2019-12-30 NOTE — Progress Notes (Signed)
ANTICOAGULATION CONSULT NOTE - Follow up [place] for Heparin Indication: splenic infarct  Allergies  Allergen Reactions  . Betadine [Povidone Iodine] Rash   Patient Measurements: Height: 5\' 5"  (165.1 cm) Weight: 65 kg (143 lb 4.8 oz) IBW/kg (Calculated) : 61.5 HEPARIN DW (KG): 65  Vital Signs: Temp: 98.5 F (36.9 C) (05/28 0500) Temp Source: Oral (05/28 0500) BP: 158/71 (05/28 0500) Pulse Rate: 72 (05/28 0500)  Labs: Recent Labs    12/27/19 1112 12/27/19 1922 12/28/19 0443 12/28/19 0443 12/29/19 0449 12/30/19 0438  HGB  --    < > 10.9*   < > 10.7* 11.1*  HCT  --    < > 32.6*  --  33.2* 33.5*  PLT  --    < > 111*  --  106* 106*  APTT 155*  --   --   --   --   --   HEPARINUNFRC 0.38   < > 0.34  --  0.37 0.25*  CREATININE  --   --  6.49*  --   --   --    < > = values in this interval not displayed.    Estimated Creatinine Clearance (by C-G formula based on SCr of 6.49 mg/dL (H)) Male: 9.4 mL/min (A) Male: 12 mL/min (A)   Medical History: Past Medical History:  Diagnosis Date  . Abdominal pain 08/28/2016  . Acute hyperkalemia 10/28/2016  . Chest pain 04/11/2016  . Chronic combined systolic and diastolic CHF (congestive heart failure) ([place])   . Cirrhosis ([place])   . Dialysis patient ([place])   . ESRD (end stage renal disease) on dialysis ([place]) 01/25/2015  . Fluid overload 04/12/2016  . HTN (hypertension) 10/13/2016  . Hyperkalemia 11/27/2016  . Hypertension   . Mitral regurgitation   . Pulmonary edema 07/03/2016  . Renal disorder   . Renal insufficiency   . Scrotal infection 01/25/2015    Assessment: Heparin initiated by EDP.  No anticoagulants per PTA med list.  Baseline labs ordered.  Pharmacy asked to follow and manage.  5/25 Heparin infusion stared @ 1050 units/hr.  05/25 @ 1112 HL: 0.38. Level is therapeutic x 1.  Goal of Therapy:  Heparin level 0.3-0.7 units/ml Monitor platelets by anticoagulation protocol: Yes   Plan:  05/28 @ 0438  HL: 0.25. Level is now SUBtherapeutic. CBC stable Will increase heparin infusion to 1200 units/hr. Will recheck HL in 6 hours.   Monitor CBC daily while on heparin infusion.   Pharmacy will continue to follow   Ena Dawley, PharmD Clinical Pharmacist 12/30/2019 6:21 AM

## 2019-12-30 NOTE — Consult Note (Signed)
Date of Consultation:  12/30/2019  Requesting Physician:  Lorella Nimrod, MD  Reason for Consultation:  Splenic infarct  History of Present Illness: Mario [name] Mario [name] is a 49 y.o. adult admitted on 5/25 with left upper quadrant abdominal pain.  He has a history of HTN, ESRD, liver cirrhosis with ascites, CHF.  He reports now the pain has been ongoing for about 8 days.  Associated with nausea and dry heaving/phlegm.  He's able to tolerate po intake.  On admission, his WBC was normal at 6.4, INR was 1.3, and LFTs were normal.  CT scan of abdomen/pelvis was obtained with showed small wedge-shaped areas of splenic infarcts.  There's also significant variceal findings around the esophagus, skin, spleen, and rectum.  Vascular surgery was consulted and CTA was obtained on 5/26 which did not show any thrombotic/embolic sources in his abdomen, with patent splenic artery.  The infarcts were stable.  He's undergoing hypercoagulable workup as well and some labs are still pending.  He's also had a cardiology consult today for evaluation for possible cardiac etiology.  His WBC remains normal and has not had any fevers, but the patient continues to complain of abdominal pain.  Past Medical History: Past Medical History:  Diagnosis Date  . Abdominal pain 08/28/2016  . Acute hyperkalemia 10/28/2016  . Chest pain 04/11/2016  . Chronic combined systolic and diastolic CHF (congestive heart failure) ([place])   . Cirrhosis ([place])   . Dialysis patient ([place])   . ESRD (end stage renal disease) on dialysis ([place]) 01/25/2015  . Fluid overload 04/12/2016  . HTN (hypertension) 10/13/2016  . Hyperkalemia 11/27/2016  . Hypertension   . Mitral regurgitation   . Pulmonary edema 07/03/2016  . Renal disorder   . Renal insufficiency   . Scrotal infection 01/25/2015     Past Surgical History: Past Surgical History:  Procedure Laterality Date  . ANTERIOR CERVICAL CORPECTOMY N/A 04/05/2019   Procedure: ANTERIOR CERVICAL CORPECTOMY  Cervical six and Cervical seven with strut graft fusion from Cervical five-Thoracic one   ;  Surgeon: Earnie Larsson, MD;  Location: [facility];  Service: Neurosurgery;  Laterality: N/A;  . AV FISTULA PLACEMENT    . POSTERIOR CERVICAL FUSION/FORAMINOTOMY N/A 04/05/2019   Procedure: Posterior cervical fusion utilizing segmental instrumentation from Cervical five-Thoracic one ;  Surgeon: Earnie Larsson, MD;  Location: [facility];  Service: Neurosurgery;  Laterality: N/A;    Home Medications: Prior to Admission medications   Medication Sig Start Date End Date Taking? Authorizing Provider  ferric citrate (AURYXIA) 1 GM 210 MG(Fe) tablet Take 420 mg by mouth 3 (three) times daily with meals.    Yes [provider]  acetaminophen (TYLENOL) 500 MG tablet Take 500 mg by mouth every 6 (six) hours as needed for mild pain.    [provider]  amLODipine (NORVASC) 10 MG tablet Take 1 tablet (10 mg total) by mouth daily. Patient not taking: Reported on 12/27/2019 04/14/19 05/14/19  Darliss Cheney, MD  cyclobenzaprine (FLEXERIL) 10 MG tablet Take 1 tablet (10 mg total) by mouth 3 (three) times daily. Patient not taking: Reported on 12/27/2019 04/13/19   Darliss Cheney, MD  lisinopril (ZESTRIL) 20 MG tablet Take 1 tablet (20 mg total) by mouth daily. 04/14/19 05/14/19  Darliss Cheney, MD  oxyCODONE (OXY IR/ROXICODONE) 5 MG immediate release tablet Take 1 tablet (5 mg total) by mouth every 6 (six) hours as needed for up to 15 doses for moderate pain. Patient not taking: Reported on 12/27/2019 04/13/19   Pahwani,  Einar Grad, MD    Allergies: Allergies  Allergen Reactions  . Betadine [Povidone Iodine] Rash    Social History:  reports that he has been smoking cigarettes. He has a 6.25 pack-year smoking history. He has never used smokeless tobacco. He reports that he does not drink alcohol or use drugs.   Family History: Family History  Problem Relation Age of Onset  . Kidney failure Father   . Diabetes Mother      Review of Systems: Review of Systems  Constitutional: Negative for chills and fever.  HENT: Negative for hearing loss.   Eyes: Negative for blurred vision.  Respiratory: Negative for shortness of breath.   Cardiovascular: Negative for chest pain.  Gastrointestinal: Positive for abdominal pain (left upper quadrant) and nausea. Negative for constipation, diarrhea and vomiting.  Genitourinary: Negative for dysuria.  Musculoskeletal: Negative for back pain.       Left lower chest pain  Neurological: Negative for dizziness.  Psychiatric/Behavioral: Negative for depression.    Physical Exam BP (!) 158/71 (BP Location: Right Arm)   Pulse 72   Temp 98.5 F (36.9 C) (Oral)   Resp 16   Ht 5\' 5"  (1.651 m)   Wt 67.3 kg   SpO2 97%   BMI 24.69 kg/m  CONSTITUTIONAL: No acute distress HEENT:  Normocephalic, atraumatic, extraocular motion intact. NECK: Trachea is midline, and there is no jugular venous distension. RESPIRATORY:  Lungs are clear, and breath sounds are equal bilaterally. Normal respiratory effort without pathologic use of accessory muscles. CARDIOVASCULAR: Heart is regular without murmurs, gallops, or rubs. GI: The abdomen is soft, distended, with some tenderness to palpation in the left upper quadrant / left lower chest area.  The pain is mostly focused towards the left lower chest, around the 9th rib, extending towards the costal margin.  The patient also has evidence of varices in the upper abdomen epigastric area. MUSCULOSKELETAL:  Normal muscle strength and tone in all four extremities.  No peripheral edema or cyanosis. SKIN: Skin turgor is normal. There are no pathologic skin lesions.  NEUROLOGIC:  Motor and sensation is grossly normal.  Cranial nerves are grossly intact. PSYCH:  Alert and oriented to person, place and time. Affect is normal.  Laboratory Analysis: Results for orders placed or performed during the hospital encounter of 12/27/19 (from the past 24 hour(s))   Heparin level (unfractionated)     Status: Abnormal   Collection Time: 12/30/19  4:38 AM  Result Value Ref Range   Heparin Unfractionated 0.25 (L) 0.30 - 0.70 IU/mL  CBC     Status: Abnormal   Collection Time: 12/30/19  4:38 AM  Result Value Ref Range   WBC 4.3 4.0 - 10.5 K/uL   RBC 3.46 (L) 4.22 - 5.81 MIL/uL   Hemoglobin 11.1 (L) 13.0 - 17.0 g/dL   HCT 33.5 (L) 39.0 - 52.0 %   MCV 96.8 80.0 - 100.0 fL   MCH 32.1 26.0 - 34.0 pg   MCHC 33.1 30.0 - 36.0 g/dL   RDW 14.4 11.5 - 15.5 %   Platelets 106 (L) 150 - 400 K/uL   nRBC 0.0 0.0 - 0.2 %  Glucose, capillary     Status: Abnormal   Collection Time: 12/30/19  7:46 AM  Result Value Ref Range   Glucose-Capillary 68 (L) 70 - 99 mg/dL  Glucose, capillary     Status: Abnormal   Collection Time: 12/30/19  8:22 AM  Result Value Ref Range   Glucose-Capillary 113 (H) 70 - 99  mg/dL   Comment 1 Notify RN     Imaging: ECHOCARDIOGRAM COMPLETE  Result Date: 12/29/2019    ECHOCARDIOGRAM REPORT   Patient Name:   Quirino AMADO Mario [name] Date of Exam: 12/29/2019 Medical Rec #:  [id number]                Height:       65.0 in Accession #:    [id number]               Weight:       143.3 lb Date of Birth:  12/30/1970               BSA:          1.717 m Patient Age:    50 years                 BP:           183/75 mmHg Patient Gender: M                        HR:           68 bpm. Exam Location:  [facility] Procedure: 2D Echo, Color Doppler and Cardiac Doppler Indications:     R06.00 Dyspnea  History:         Patient has prior history of Echocardiogram examinations. CHF,                  ESRD; Risk Factors:Hypertension.  Sonographer:     Charmayne Sheer RDCS (AE) Referring Phys:  [id number] [facility] AMIN Diagnosing Phys: Serafina Royals MD IMPRESSIONS  1. Left ventricular ejection fraction, by estimation, is 55 to 60%. The left ventricle has normal function. The left ventricle has no regional wall motion abnormalities. Left ventricular diastolic parameters were normal.  2.  Right ventricular systolic function is normal. The right ventricular size is normal.  3. Left atrial size was mildly dilated.  4. The mitral valve is myxomatous. Trivial mitral valve regurgitation. Mild mitral stenosis.  5. The aortic valve is normal in structure. Aortic valve regurgitation is trivial. FINDINGS  Left Ventricle: Left ventricular ejection fraction, by estimation, is 55 to 60%. The left ventricle has normal function. The left ventricle has no regional wall motion abnormalities. The left ventricular internal cavity size was normal in size. There is  no left ventricular hypertrophy. Left ventricular diastolic parameters were normal. Right Ventricle: The right ventricular size is normal. No increase in right ventricular wall thickness. Right ventricular systolic function is normal. Left Atrium: Left atrial size was mildly dilated. Right Atrium: Right atrial size was normal in size. Pericardium: There is no evidence of pericardial effusion. Mitral Valve: The mitral valve is myxomatous. Trivial mitral valve regurgitation. Mild mitral valve stenosis. MV peak gradient, 18.8 mmHg. The mean mitral valve gradient is 9.0 mmHg. Tricuspid Valve: The tricuspid valve is normal in structure. Tricuspid valve regurgitation is trivial. Aortic Valve: The aortic valve is normal in structure. Aortic valve regurgitation is trivial. Aortic valve mean gradient measures 15.0 mmHg. Aortic valve peak gradient measures 33.0 mmHg. Aortic valve area, by VTI measures 1.94 cm. Pulmonic Valve: The pulmonic valve was normal in structure. Pulmonic valve regurgitation is not visualized. Aorta: The aortic root and ascending aorta are structurally normal, with no evidence of dilitation. IAS/Shunts: No atrial level shunt detected by color flow Doppler.  LEFT VENTRICLE PLAX 2D LVIDd:         5.87 cm  Diastology LVIDs:  3.31 cm  LV e' lateral:   9.14 cm/s LV PW:         1.06 cm  LV E/e' lateral: 19.8 LV IVS:        0.69 cm  LV e'  medial:    5.98 cm/s LVOT diam:     2.00 cm  LV E/e' medial:  30.3 LV SV:         103 LV SV Index:   60 LVOT Area:     3.14 cm  RIGHT VENTRICLE RV Basal diam:  3.25 cm LEFT ATRIUM           Index       RIGHT ATRIUM           Index LA diam:      4.10 cm 2.39 cm/m  RA Area:     17.10 cm LA Vol (A2C): 48.9 ml 28.48 ml/m RA Volume:   46.90 ml  27.32 ml/m LA Vol (A4C): 42.3 ml 24.64 ml/m  AORTIC VALVE                    PULMONIC VALVE AV Area (Vmax):    1.85 cm     PV Vmax:       2.11 m/s AV Area (Vmean):   1.80 cm     PV Vmean:      119.000 cm/s AV Area (VTI):     1.94 cm     PV VTI:        0.380 m AV Vmax:           287.33 cm/s  PV Peak grad:  17.8 mmHg AV Vmean:          176.333 cm/s PV Mean grad:  7.0 mmHg AV VTI:            0.534 m AV Peak Grad:      33.0 mmHg AV Mean Grad:      15.0 mmHg LVOT Vmax:         169.00 cm/s LVOT Vmean:        101.000 cm/s LVOT VTI:          0.329 m LVOT/AV VTI ratio: 0.62  AORTA Ao Root diam: 3.00 cm MITRAL VALVE MV Area (PHT): 2.72 cm     SHUNTS MV Peak grad:  18.8 mmHg    Systemic VTI:  0.33 m MV Mean grad:  9.0 mmHg     Systemic Diam: 2.00 cm MV Vmax:       2.17 m/s MV Vmean:      143.0 cm/s MV Decel Time: 279 msec MV E velocity: 181.00 cm/s MV A velocity: 138.00 cm/s MV E/A ratio:  1.31 Serafina Royals MD Electronically signed by Serafina Royals MD Signature Date/Time: 12/29/2019/5:31:52 PM    Final     Assessment and Plan: This is a 49 y.o. adult with history of cirrhosis, recently admitted with splenic infarct.  --Reviewing the two CT scans that the patient has had, the areas of infarct are stable.  There is no evidence of abscess formation or hemorrhage at this point.  From the surgical standpoint, there is no indication for splenectomy, and this is rarely indicated for this kind of presentation.  Furthermore, given his cirrhosis, he has significant evidence of portal hypertension with esophageal varices, recanalization of umbilical vein, varices around rectum, he  would not be a good surgical candidate.  At this point, he does not need any surgical intervention. --There is a possibility that the  infarct can develop into an abscess.  Empiric antibiotics are not needed.  However, if he starts developing fevers, worsening LUQ pain, or worsening WBC, may be worthwhile to repeat CT scan to evaluate further. --With regards to his pain, recommend analgesic pain medication management.  May benefit from Lidocaine patch given some potential MSK involvement as well. --With regards to anticoagulation, would defer to cardiology as he's still having workup done for the etiology of the infarct, vs Hematology if cardiac workup is negative. --Surgery will sign off for now, but please feel free to contact again if any questions or concerns.   Face-to-face time spent with the patient and care providers was 55 minutes, with more than 50% of the time spent counseling, educating, and coordinating care of the patient.     Melvyn Neth, MD [facility] Pg:  [phone]

## 2019-12-30 NOTE — Progress Notes (Signed)
[facility], [place] 12/30/19  Subjective:   LOS: 3 Patient presented to the emergency room for left flank pain and abdominal pain.  States pain has been for about 3 to 5 days. Diagnosed with splenic infarcts last CT angiogram on May 26. Denies any shortness of breath or acute complaints today Ate about half of his breakfast this morning   Objective:  Vital signs in last 24 hours:  Temp:  [98.4 F (36.9 C)-98.9 F (37.2 C)] 98.5 F (36.9 C) (05/28 0500) Pulse Rate:  [65-72] 72 (05/28 0500) Resp:  [15-20] 16 (05/28 0500) BP: (158-184)/(71-77) 158/71 (05/28 0500) SpO2:  [97 %-100 %] 97 % (05/28 0500) Weight:  [67.3 kg] 67.3 kg (05/28 0500)  Weight change:  Filed Weights   12/26/19 2107 12/27/19 1230 12/30/19 0500  Weight: 65 kg 65 kg 67.3 kg    Intake/Output:    Intake/Output Summary (Last 24 hours) at 12/30/2019 1002 Last data filed at 12/30/2019 0500 Gross per 24 hour  Intake 1175.49 ml  Output 0 ml  Net 1175.49 ml     Physical Exam: General:  Chronically ill-appearing, laying in the bed  HEENT  moist oral mucous membrane  Pulm/lungs  normal breathing effort on room air  CVS/Heart  no rub or gallop  Abdomen:   Soft, mild left abdominal tenderness  Extremities:  Trace edema  Neurologic:  Alert, able to follow commands  Skin:  Warm, dry  Access:  Left arm aneurysmal fistula       Basic Metabolic Panel:  Recent Labs  Lab 12/26/19 2109 12/27/19 1253 12/28/19 0443  NA 136  --  137  K 4.8  --  4.9  CL 92*  --  97*  CO2 26  --  28  GLUCOSE 99  --  79  BUN 49*  --  25*  CREATININE 10.23*  --  6.49*  CALCIUM 7.7*  --  8.4*  PHOS  --  6.5*  --      CBC: Recent Labs  Lab 12/26/19 2109 12/27/19 1922 12/28/19 0443 12/29/19 0449 12/30/19 0438  WBC 6.4 4.4 4.5 4.0 4.3  HGB 11.2* 10.8* 10.9* 10.7* 11.1*  HCT 33.4* 34.0* 32.6* 33.2* 33.5*  MCV 96.3 99.1 97.0 99.1 96.8  PLT 126* 111* 111* 106* 106*      Lab Results   Component Value Date   HEPBSAG NON REACTIVE 12/27/2019   HEPBSAB Reactive (A) 12/27/2019   HEPBIGM Negative 05/02/2018      Microbiology:  Recent Results (from the past 240 hour(s))  SARS Coronavirus 2 by RT PCR (hospital order, performed in [place] hospital lab) Nasopharyngeal Nasopharyngeal Swab     Status: None   Collection Time: 12/27/19  4:38 AM   Specimen: Nasopharyngeal Swab  Result Value Ref Range Status   SARS Coronavirus 2 NEGATIVE NEGATIVE Final    Comment: (NOTE) SARS-CoV-2 target nucleic acids are NOT DETECTED. The SARS-CoV-2 RNA is generally detectable in upper and lower respiratory specimens during the acute phase of infection. The lowest concentration of SARS-CoV-2 viral copies this assay can detect is 250 copies / mL. A negative result does not preclude SARS-CoV-2 infection and should not be used as the sole basis for treatment or other patient management decisions.  A negative result may occur with improper specimen collection / handling, submission of specimen other than nasopharyngeal swab, presence of viral mutation(s) within the areas targeted by this assay, and inadequate number of viral copies (<250 copies / mL). A negative  result must be combined with clinical observations, patient history, and epidemiological information. Fact Sheet for Patients:   StrictlyIdeas.no Fact Sheet for Healthcare Providers: BankingDealers.co.za This test is not yet approved or cleared  by the [place] FDA and has been authorized for detection and/or diagnosis of SARS-CoV-2 by FDA under an Emergency Use Authorization (EUA).  This EUA will remain in effect (meaning this test can be used) for the duration of the COVID-19 declaration under Section 564(b)(1) of the Act, 21 U.S.C. section 360bbb-3(b)(1), unless the authorization is terminated or revoked sooner. Performed at [facility], [address]  [place], [place] [address]     Coagulation Studies: No results for input(s): LABPROT, INR in the last 72 hours.  Urinalysis: No results for input(s): COLORURINE, LABSPEC, PHURINE, GLUCOSEU, HGBUR, BILIRUBINUR, KETONESUR, PROTEINUR, UROBILINOGEN, NITRITE, LEUKOCYTESUR in the last 72 hours.  Invalid input(s): APPERANCEUR    Imaging: ECHOCARDIOGRAM COMPLETE  Result Date: 12/29/2019    ECHOCARDIOGRAM REPORT   Patient Name:   Mario [name] Date of Exam: 12/29/2019 Medical Rec #:  [id number]                Height:       65.0 in Accession #:    [id number]               Weight:       143.3 lb Date of Birth:  [birth year]               BSA:          1.717 m Patient Age:    49 years                 BP:           183/75 mmHg Patient Gender: M                        HR:           68 bpm. Exam Location:  [facility] Procedure: 2D Echo, Color Doppler and Cardiac Doppler Indications:     R06.00 Dyspnea  History:         Patient has prior history of Echocardiogram examinations. CHF,                  ESRD; Risk Factors:Hypertension.  Sonographer:     Mario [name] RDCS (AE) Referring Phys:  [id number] [facility] AMIN Diagnosing Phys: Mario Royals MD IMPRESSIONS  1. Left ventricular ejection fraction, by estimation, is 55 to 60%. The left ventricle has normal function. The left ventricle has no regional wall motion abnormalities. Left ventricular diastolic parameters were normal.  2. Right ventricular systolic function is normal. The right ventricular size is normal.  3. Left atrial size was mildly dilated.  4. The mitral valve is myxomatous. Trivial mitral valve regurgitation. Mild mitral stenosis.  5. The aortic valve is normal in structure. Aortic valve regurgitation is trivial. FINDINGS  Left Ventricle: Left ventricular ejection fraction, by estimation, is 55 to 60%. The left ventricle has normal function. The left ventricle has no regional wall motion abnormalities. The left ventricular internal cavity size was normal in  size. There is  no left ventricular hypertrophy. Left ventricular diastolic parameters were normal. Right Ventricle: The right ventricular size is normal. No increase in right ventricular wall thickness. Right ventricular systolic function is normal. Left Atrium: Left atrial size was mildly dilated. Right Atrium: Right atrial size was normal in size. Pericardium: There is  no evidence of pericardial effusion. Mitral Valve: The mitral valve is myxomatous. Trivial mitral valve regurgitation. Mild mitral valve stenosis. MV peak gradient, 18.8 mmHg. The mean mitral valve gradient is 9.0 mmHg. Tricuspid Valve: The tricuspid valve is normal in structure. Tricuspid valve regurgitation is trivial. Aortic Valve: The aortic valve is normal in structure. Aortic valve regurgitation is trivial. Aortic valve mean gradient measures 15.0 mmHg. Aortic valve peak gradient measures 33.0 mmHg. Aortic valve area, by VTI measures 1.94 cm. Pulmonic Valve: The pulmonic valve was normal in structure. Pulmonic valve regurgitation is not visualized. Aorta: The aortic root and ascending aorta are structurally normal, with no evidence of dilitation. IAS/Shunts: No atrial level shunt detected by color flow Doppler.  LEFT VENTRICLE PLAX 2D LVIDd:         5.87 cm  Diastology LVIDs:         3.31 cm  LV e' lateral:   9.14 cm/s LV PW:         1.06 cm  LV E/e' lateral: 19.8 LV IVS:        0.69 cm  LV e' medial:    5.98 cm/s LVOT diam:     2.00 cm  LV E/e' medial:  30.3 LV SV:         103 LV SV Index:   60 LVOT Area:     3.14 cm  RIGHT VENTRICLE RV Basal diam:  3.25 cm LEFT ATRIUM           Index       RIGHT ATRIUM           Index LA diam:      4.10 cm 2.39 cm/m  RA Area:     17.10 cm LA Vol (A2C): 48.9 ml 28.48 ml/m RA Volume:   46.90 ml  27.32 ml/m LA Vol (A4C): 42.3 ml 24.64 ml/m  AORTIC VALVE                    PULMONIC VALVE AV Area (Vmax):    1.85 cm     PV Vmax:       2.11 m/s AV Area (Vmean):   1.80 cm     PV Vmean:      119.000 cm/s AV  Area (VTI):     1.94 cm     PV VTI:        0.380 m AV Vmax:           287.33 cm/s  PV Peak grad:  17.8 mmHg AV Vmean:          176.333 cm/s PV Mean grad:  7.0 mmHg AV VTI:            0.534 m AV Peak Grad:      33.0 mmHg AV Mean Grad:      15.0 mmHg LVOT Vmax:         169.00 cm/s LVOT Vmean:        101.000 cm/s LVOT VTI:          0.329 m LVOT/AV VTI ratio: 0.62  AORTA Ao Root diam: 3.00 cm MITRAL VALVE MV Area (PHT): 2.72 cm     SHUNTS MV Peak grad:  18.8 mmHg    Systemic VTI:  0.33 m MV Mean grad:  9.0 mmHg     Systemic Diam: 2.00 cm MV Vmax:       2.17 m/s MV Vmean:      143.0 cm/s MV Decel Time: 279 msec MV E velocity:  181.00 cm/s MV A velocity: 138.00 cm/s MV E/A ratio:  1.31 Mario Royals MD Electronically signed by Mario Royals MD Signature Date/Time: 12/29/2019/5:31:52 PM    Final      Medications:   . heparin 1,200 Units/hr (12/30/19 8242)   . Chlorhexidine Gluconate Cloth  6 each Topical Q0600  . ferric citrate  420 mg Oral TID WC  . irbesartan  300 mg Oral QHS  . nicotine  21 mg Transdermal Daily   albuterol, dextromethorphan-guaiFENesin, hydrALAZINE, hydrOXYzine, iohexol, morphine injection, oxyCODONE  Assessment/ Plan:  49 y.o. adult with  was admitted on 12/27/2019 for  Principal Problem:   Splenic infarction Active Problems:   ESRD (end stage renal disease) on dialysis ([facility])   Chronic diastolic (congestive) heart failure ([facility])   Cirrhosis ([facility])   HTN (hypertension)   Tobacco abuse   Anemia in ESRD (end-stage renal disease) ([place])  Splenic infarction [D73.5] Thromboembolism ([place]) [I74.9] Splenic infarct [D73.5] ESRD (end stage renal disease) on dialysis ([place]) [N18.6, Z99.2] Abdominal pain, unspecified abdominal location [R10.9] Non-intractable vomiting with nausea, unspecified vomiting type [R11.2]  #. ESRD [employer] [facility]/TTS/[facility] weight 65 kg/[facility] nephrology We will arrange for hemodialysis on his usual schedule while he is in the hospital Prefer  standing weights Next hemodialysis planned for Saturday   #. Anemia of CKD  Lab Results  Component Value Date   HGB 11.1 (L) 12/30/2019   Low dose EPO with HD once hemoglobin less than 11  #.Secondary hyperparathyroidism of renal origin N 25.81      Component Value Date/Time   PTH [facility] 04/12/2019 1349   Lab Results  Component Value Date   PHOS 6.5 (H) 12/27/2019   Monitor calcium and phos level during this admission  #Left upper quadrant abdominal pain, splenic infarction IV heparin Pain control Vascular surgery evaluation in progress  #Hypertension Discontinued lisinopril and added ARB irbesartan this admission.  To be administered at night.    LOS: [place] 5/28/202110:02 AM  [place], [place]

## 2019-12-30 NOTE — TOC Initial Note (Signed)
Transition of Care [facility]) - Initial/Assessment Note    Patient Details  Name: Mario [name] MRN: [id number] Date of Birth: 03-10-1971  Transition of Care [facility]) CM/SW Contact:    Candie Chroman, LCSW Phone Number: 12/30/2019, 11:43 AM  Clinical Narrative: Readmission prevention screen complete. Tablet interpreter off unit. Nurse provided interpreter services. CSW met with patient. No supports at bedside. CSW introduced role and explained that discharge planning would be discussed. Patient does not have a PCP. Offered booklet for free/low-cost healthcare in [facility] but patient refused, stating he cannot read or write. He has no family in the area to help him and he says his friends cannot help him either. Patient gets his medications at [facility] because he cannot afford them. His friends drive him to HD. He did not use any DME or have home health services prior to admission. He has a walker from a previous surgery but does not use it. No further concerns. CSW encouraged patient to contact CSW as needed. CSW will continue to follow patient for support and facilitate return home when stable.               Expected Discharge Plan: Home/Self Care Barriers to Discharge: Continued Medical Work up   Patient Goals and CMS Choice        Expected Discharge Plan and Services Expected Discharge Plan: Home/Self Care     Post Acute Care Choice: NA Living arrangements for the past 2 months: Single Family Home                                      Prior Living Arrangements/Services Living arrangements for the past 2 months: Single Family Home Lives with:: Self Patient language and need for interpreter reviewed:: Yes(Spanish) Do you feel safe going back to the place where you live?: Yes      Need for Family Participation in Patient Care: No (Comment) Care giver support system in place?: No (comment)   Criminal Activity/Legal Involvement Pertinent to  Current Situation/Hospitalization: No - Comment as needed  Activities of Daily Living Home Assistive Devices/Equipment: None ADL Screening (condition at time of admission) Patient's cognitive ability adequate to safely complete daily activities?: Yes Is the patient deaf or have difficulty hearing?: No Does the patient have difficulty seeing, even when wearing glasses/contacts?: No Does the patient have difficulty concentrating, remembering, or making decisions?: No Patient able to express need for assistance with ADLs?: Yes Does the patient have difficulty dressing or bathing?: No Independently performs ADLs?: Yes (appropriate for developmental age) Does the patient have difficulty walking or climbing stairs?: No Weakness of Legs: None Weakness of Arms/Hands: None  Permission Sought/Granted                  Emotional Assessment Appearance:: Appears stated age Attitude/Demeanor/Rapport: Engaged Affect (typically observed): Appropriate, Calm Orientation: : Oriented to Self, Oriented to Place, Oriented to  Time, Oriented to Situation Alcohol / Substance Use: Not Applicable Psych Involvement: No (comment)  Admission diagnosis:  Splenic infarction [D73.5] Thromboembolism (HCC) [I74.9] Splenic infarct [D73.5] ESRD (end stage renal disease) on dialysis ([place]) [N18.6, Z99.2] Abdominal pain, unspecified abdominal location [R10.9] Non-intractable vomiting with nausea, unspecified vomiting type [R11.2] Patient Active Problem List   Diagnosis Date Noted  . 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  . Tobacco abuse 04/16/2019  . Anemia in ESRD (end-stage renal disease) ([place]) 04/16/2019  . Neck pain,  bilateral posterior 04/16/2019  . Cervical spinal stenosis 04/13/2019  . Cervical vertebral fracture ([place]) 04/13/2019  . Neck pain 04/03/2019  . Cervical radiculopathy 04/03/2019  . Hypertensive urgency 04/01/2019  . Intractable abdominal pain 05/01/2018  . Gastroenteritis 10/02/2017  . Abdominal  pain, acute 08/21/2017  . Malnutrition of moderate degree 08/21/2017  . SBP (spontaneous bacterial peritonitis) ([place]) 06/09/2017  . Erroneous encounter - disregard 03/04/2017  . Hyperkalemia 11/27/2016  . Acute hyperkalemia 10/28/2016  . Chronic diastolic (congestive) heart failure ([place]) 10/13/2016  . Cirrhosis ([place]) 10/13/2016  . HTN (hypertension) 10/13/2016  . Abdominal pain 08/28/2016  . Pulmonary edema 07/03/2016  . Fluid overload 04/12/2016  . Chest pain 04/11/2016  . Scrotal infection 01/25/2015  . ESRD (end stage renal disease) on dialysis ([place]) 01/25/2015   PCP:  [name], MD Pharmacy:   [facility] [address] ([address]), [place] [address] [place]) [place] [address] Phone: [phone] Fax: [place], [place] - [address] [place] [address] Phone: [phone] Fax: [phone]     Social Determinants of Health (SDOH) Interventions    Readmission Risk Interventions Readmission Risk Prevention Plan 12/30/2019  Transportation Screening Complete  PCP or Specialist Appt within 3-5 Days Patient refused  Social Work Consult for [place] Planning/Counseling Complete  Palliative Care Screening Not Applicable  Medication Review Press photographer) Complete  Some recent data might be hidden

## 2019-12-30 NOTE — Progress Notes (Signed)
ANTICOAGULATION CONSULT NOTE - Follow up [place] for Heparin Indication: splenic infarct  Allergies  Allergen Reactions  . Betadine [Povidone Iodine] Rash   Patient Measurements: Height: 5\' 5"  (165.1 cm) Weight: 67.3 kg (148 lb 5.9 oz) IBW/kg (Calculated) : 61.5 HEPARIN DW (KG): 65  Vital Signs: Temp: 98.4 F (36.9 C) (05/28 1201) Temp Source: Oral (05/28 1201) BP: 164/80 (05/28 1201) Pulse Rate: 68 (05/28 1201)  Labs: Recent Labs    12/28/19 0443 12/28/19 0443 12/29/19 0449 12/30/19 0438 12/30/19 1248  HGB 10.9*   < > 10.7* 11.1*  --   HCT 32.6*  --  33.2* 33.5*  --   PLT 111*  --  106* 106*  --   HEPARINUNFRC 0.34   < > 0.37 0.25* 0.39  CREATININE 6.49*  --   --   --   --    < > = values in this interval not displayed.    Estimated Creatinine Clearance (by C-G formula based on SCr of 6.49 mg/dL (H)) Male: 9.4 mL/min (A) Male: 12 mL/min (A)   Medical History: Past Medical History:  Diagnosis Date  . Abdominal pain 08/28/2016  . Acute hyperkalemia 10/28/2016  . Chest pain 04/11/2016  . Chronic combined systolic and diastolic CHF (congestive heart failure) ([place])   . Cirrhosis ([place])   . Dialysis patient ([place])   . ESRD (end stage renal disease) on dialysis ([place]) 01/25/2015  . Fluid overload 04/12/2016  . HTN (hypertension) 10/13/2016  . Hyperkalemia 11/27/2016  . Hypertension   . Mitral regurgitation   . Pulmonary edema 07/03/2016  . Renal disorder   . Renal insufficiency   . Scrotal infection 01/25/2015    Assessment: Heparin initiated by EDP.  No anticoagulants per PTA med list.  Baseline labs ordered.  Pharmacy asked to follow and manage.  5/28 1248 HL 0.39  Goal of Therapy:  Heparin level 0.3-0.7 units/ml Monitor platelets by anticoagulation protocol: Yes   Plan:  Heparin level is therapeutic x 1. Will continue current rate of heparin. Will order heparin level in 6 hours. CBC daily while on heparin. CBC stable. Plt are trending down  - continue to monitor.   Pharmacy will continue to follow   Oswald Hillock, PharmD, BCPS Clinical Pharmacist 12/30/2019 2:12 PM

## 2019-12-30 NOTE — Progress Notes (Signed)
ANTICOAGULATION CONSULT NOTE - Follow up [place] for Heparin Indication: splenic infarct  Allergies  Allergen Reactions  . Betadine [Povidone Iodine] Rash   Patient Measurements: Height: 5\' 5"  (165.1 cm) Weight: 67.3 kg (148 lb 5.9 oz) IBW/kg (Calculated) : 61.5 HEPARIN DW (KG): 65  Vital Signs: Temp: 98.4 F (36.9 C) (05/28 1201) Temp Source: Oral (05/28 1201) BP: 164/80 (05/28 1201) Pulse Rate: 68 (05/28 1201)  Labs: Recent Labs    12/28/19 0443 12/28/19 0443 12/29/19 0449 12/29/19 0449 12/30/19 0438 12/30/19 1248 12/30/19 1950  HGB 10.9*   < > 10.7*  --  11.1*  --   --   HCT 32.6*  --  33.2*  --  33.5*  --   --   PLT 111*  --  106*  --  106*  --   --   HEPARINUNFRC 0.34   < > 0.37   < > 0.25* 0.39 0.43  CREATININE 6.49*  --   --   --   --   --   --    < > = values in this interval not displayed.    Estimated Creatinine Clearance (by C-G formula based on SCr of 6.49 mg/dL (H)) Male: 9.4 mL/min (A) Male: 12 mL/min (A)   Medical History: Past Medical History:  Diagnosis Date  . Abdominal pain 08/28/2016  . Acute hyperkalemia 10/28/2016  . Chest pain 04/11/2016  . Chronic combined systolic and diastolic CHF (congestive heart failure) ([place])   . Cirrhosis ([place])   . Dialysis patient ([place])   . ESRD (end stage renal disease) on dialysis ([place]) 01/25/2015  . Fluid overload 04/12/2016  . HTN (hypertension) 10/13/2016  . Hyperkalemia 11/27/2016  . Hypertension   . Mitral regurgitation   . Pulmonary edema 07/03/2016  . Renal disorder   . Renal insufficiency   . Scrotal infection 01/25/2015    Assessment: Heparin initiated by EDP.  No anticoagulants per PTA med list.  Baseline labs ordered.  Pharmacy asked to follow and manage.  5/28 1248 HL 0.39 Heparin level is therapeutic x 1. Will continue current rate of heparin.  Goal of Therapy:  Heparin level 0.3-0.7 units/ml Monitor platelets by anticoagulation protocol: Yes   Plan:  5/28 1950 HL=  0.43 therapeutic.Will continue current rate of 1200 units/hr. F/u HL with am labs.  CBC daily while on heparin.   Pharmacy will continue to follow   Noralee Space, PharmD, BCPS Clinical Pharmacist 12/30/2019 8:29 PM

## 2019-12-30 NOTE — Consult Note (Signed)
[facility] Consultation Note  Patient ID: Mario [name], MRN: [id number], DOB/AGE: [birth year] 49 y.o. Admit date: 12/27/2019   Date of Consult: 12/30/2019 Primary Physician: [name], MD Primary Cardiologist: None  Chief Complaint:  Chief Complaint  Patient presents with  . Abdominal Pain   Reason for Consult: Splenic infarct  HPI: 49 y.o. adult with known hypertension hyperlipidemia end-stage renal disease with clinically some valvular heart disease having an acute evidence of splenic infarct.  After further vascular work-up there was no apparent primary cause for splenic infarct and therefore there has been concerns of hypercoagulable state as well as possible embolic phenomenon.  Current hypercoagulable work-up does not appear to have a primary result.  Echocardiogram shown normal LV systolic function and no evidence of significant valvular heart disease although clinically the patient does have murmurs and may have valvular changes.  There has been no current evidence of atrial fibrillation or telemetry changes both during the hospital and/or during dialysis but atrial fibrillation is possible.  The patient has been improving so far with tolerating dialysis and not having any further significant complication.  The patient remains on heparin at this time until further evaluation and treatment options.  Past Medical History:  Diagnosis Date  . Abdominal pain 08/28/2016  . Acute hyperkalemia 10/28/2016  . Chest pain 04/11/2016  . Chronic combined systolic and diastolic CHF (congestive heart failure) ([place])   . Cirrhosis ([place])   . Dialysis patient ([place])   . ESRD (end stage renal disease) on dialysis ([place]) 01/25/2015  . Fluid overload 04/12/2016  . HTN (hypertension) 10/13/2016  . Hyperkalemia 11/27/2016  . Hypertension   . Mitral regurgitation   . Pulmonary edema 07/03/2016  . Renal disorder   . Renal insufficiency   . Scrotal infection 01/25/2015       Surgical History:  Past Surgical History:  Procedure Laterality Date  . ANTERIOR CERVICAL CORPECTOMY N/A 04/05/2019   Procedure: ANTERIOR CERVICAL CORPECTOMY Cervical six and Cervical seven with strut graft fusion from Cervical five-Thoracic one   ;  Surgeon: Earnie Larsson, MD;  Location: [place];  Service: Neurosurgery;  Laterality: N/A;  . AV FISTULA PLACEMENT    . POSTERIOR CERVICAL FUSION/FORAMINOTOMY N/A 04/05/2019   Procedure: Posterior cervical fusion utilizing segmental instrumentation from Cervical five-Thoracic one ;  Surgeon: Earnie Larsson, MD;  Location: [place];  Service: Neurosurgery;  Laterality: N/A;     Home Meds: Prior to Admission medications   Medication Sig Start Date End Date Taking? Authorizing Provider  ferric citrate (AURYXIA) 1 GM 210 MG(Fe) tablet Take 420 mg by mouth 3 (three) times daily with meals.    Yes [provider]  acetaminophen (TYLENOL) 500 MG tablet Take 500 mg by mouth every 6 (six) hours as needed for mild pain.    [provider]  amLODipine (NORVASC) 10 MG tablet Take 1 tablet (10 mg total) by mouth daily. Patient not taking: Reported on 12/27/2019 04/14/19 05/14/19  Darliss Cheney, MD  cyclobenzaprine (FLEXERIL) 10 MG tablet Take 1 tablet (10 mg total) by mouth 3 (three) times daily. Patient not taking: Reported on 12/27/2019 04/13/19   Darliss Cheney, MD  lisinopril (ZESTRIL) 20 MG tablet Take 1 tablet (20 mg total) by mouth daily. 04/14/19 05/14/19  Darliss Cheney, MD  oxyCODONE (OXY IR/ROXICODONE) 5 MG immediate release tablet Take 1 tablet (5 mg total) by mouth every 6 (six) hours as needed for up to 15 doses for moderate pain. Patient not taking: Reported on 12/27/2019  04/13/19   Darliss Cheney, MD    Inpatient Medications:  . Chlorhexidine Gluconate Cloth  6 each Topical Q0600  . ferric citrate  420 mg Oral TID WC  . irbesartan  300 mg Oral QHS  . nicotine  21 mg Transdermal Daily   . heparin 1,200 Units/hr (12/30/19 2409)    Allergies:   Allergies  Allergen Reactions  . Betadine [Povidone Iodine] Rash    Social History   Socioeconomic History  . Marital status: Single    Spouse name: Not on file  . Number of children: Not on file  . Years of education: Not on file  . Highest education level: Not on file  Occupational History  . Not on file  Tobacco Use  . Smoking status: Current Every Day Smoker    Packs/day: 0.25    Years: 25.00    Pack years: 6.25    Types: Cigarettes  . Smokeless tobacco: Never Used  Substance and Sexual Activity  . Alcohol use: No    Comment: pt denies  . Drug use: No  . Sexual activity: Not on file  Other Topics Concern  . Not on file  Social History Narrative  . Not on file   Social Determinants of Health   Financial Resource Strain:   . Difficulty of Paying Living Expenses:   Food Insecurity:   . Worried About Charity fundraiser in the Last Year:   . Arboriculturist in the Last Year:   Transportation Needs:   . Film/video editor (Medical):   Marland Kitchen Lack of Transportation (Non-Medical):   Physical Activity:   . Days of Exercise per Week:   . Minutes of Exercise per Session:   Stress:   . Feeling of Stress :   Social Connections:   . Frequency of Communication with Friends and Family:   . Frequency of Social Gatherings with Friends and Family:   . Attends Religious Services:   . Active Member of Clubs or Organizations:   . Attends Archivist Meetings:   Marland Kitchen Marital Status:   Intimate Partner Violence:   . Fear of Current or Ex-Partner:   . Emotionally Abused:   Marland Kitchen Physically Abused:   . Sexually Abused:      Family History  Problem Relation Age of Onset  . Kidney failure Father   . Diabetes Mother      Review of Systems Positive for splenic infarct pain Negative for: General:  chills, fever, night sweats or weight changes.  Cardiovascular: PND orthopnea syncope dizziness  Dermatological skin lesions rashes Respiratory: Cough congestion Urologic:  Frequent urination urination at night and hematuria Abdominal: negative for nausea, vomiting, diarrhea, bright red blood per rectum, melena, or hematemesis Neurologic: negative for visual changes, and/or hearing changes  All other systems reviewed and are otherwise negative except as noted above.  Labs: No results for input(s): CKTOTAL, CKMB, TROPONINI in the last 72 hours. Lab Results  Component Value Date   WBC 4.3 12/30/2019   HGB 11.1 (L) 12/30/2019   HCT 33.5 (L) 12/30/2019   MCV 96.8 12/30/2019   PLT 106 (L) 12/30/2019    Recent Labs  Lab 12/26/19 2109 12/27/19 0245 12/28/19 0443  NA   < >  --  137  K   < >  --  4.9  CL   < >  --  97*  CO2   < >  --  28  BUN   < >  --  25*  CREATININE   < >  --  6.49*  CALCIUM   < >  --  8.4*  PROT  --  7.1  --   BILITOT  --  1.1  --   ALKPHOS  --  163*  --   ALT  --  15  --   AST  --  25  --   GLUCOSE   < >  --  79   < > = values in this interval not displayed.   Lab Results  Component Value Date   CHOL 180 04/04/2019   HDL 69 04/04/2019   LDLCALC 96 04/04/2019   TRIG 75 04/04/2019   No results found for: DDIMER  Radiology/Studies:  DG Chest 2 View  Result Date: 12/27/2019 CLINICAL DATA:  Left upper quadrant pain. Vomiting. EXAM: CHEST - 2 VIEW COMPARISON:  Chest radiograph 03/24/2019 FINDINGS: Unchanged heart size and mediastinal contours. Peribronchial thickening. Suspected small left pleural effusion. Subsegmental atelectasis at the bases. No pneumothorax. Surgical hardware in the cervical spine is partially included. IMPRESSION: 1. Mild cardiomegaly. Peribronchial thickening may be bronchitic or congestive. 2. Suspected small left pleural effusion. Electronically Signed   By: Keith Rake M.D.   On: 12/27/2019 02:09   CT Abdomen Pelvis W Contrast  Addendum Date: 12/27/2019   ADDENDUM REPORT: 12/27/2019 03:58 ADDENDUM: These results were called by telephone on 12/27/2019 at 3:58 am to provider JADE SUNG , who verbally  acknowledged these results. Electronically Signed   By: Lovena Le M.D.   On: 12/27/2019 03:58   Result Date: 12/27/2019 CLINICAL DATA:  Nausea vomiting, left-sided abdominal pain, recent dialysis 2 days prior EXAM: CT ABDOMEN AND PELVIS WITH CONTRAST TECHNIQUE: Multidetector CT imaging of the abdomen and pelvis was performed using the standard protocol following bolus administration of intravenous contrast. CONTRAST:  183mL OMNIPAQUE IOHEXOL 300 MG/ML  SOLN COMPARISON:  CT 05/01/2018 FINDINGS: Lower chest: Dependent atelectatic changes noted in the lung bases. Chronic left basilar thickening calcified pleural plaque with adjacent rounded atelectasis in the left lower lobe, similar to comparison exam. No new consolidative opacity. Mild cardiomegaly. Dense calcification of the mitral annulus and upon the chordae tendinae. Apical thinning of the left ventricle with calcification may reflect sequela of prior infarct. Coronary artery calcifications are present. No pericardial effusion. Hepatobiliary: There is a diffusely did dysplastic appearance throughout the liver with a markedly nodular liver surface contour, similar to comparison exams. This likely reflect sequela of chronic fibrotic changes of cirrhosis. No focal liver lesion. Mild gallbladder wall thickening is nonspecific in the setting of intrinsic liver disease. No visible calcified gallstones or biliary ductal dilatation. Pancreas: Unremarkable. No pancreatic ductal dilatation or surrounding inflammatory changes. Spleen: Wedge-shaped regions of hypoattenuation in the central spleen and anterior spleen (2/39 for example) have an appearance suggestive of splenic infarct. Splenomegaly is noted. Adrenals/Urinary Tract: Normal adrenal glands. Diffusely atrophic and dysplastic appearance of the kidneys with multiple small cysts of mixed attenuation many of which are unchanged from comparison in 2019. Findings are compatible with chronic dialysis dependence. No  hydronephrosis or urolithiasis. No new concerning renal lesion. Urinary bladder is largely decompressed at the time of exam and therefore poorly evaluated by CT imaging. Diffuse bladder wall thickening is noted. A calcification seen along the and anterior bladder wall is unchanged prior. Stomach/Bowel: Extensive paraesophageal varices. Gastric varices are more mild. Diffuse mild edematous thickening of the small bowel likely related to the moderate volume ascites and or portal enteropathy. A normal appendix is  visualized. No colonic dilatation or wall thickening. Moderate stool burden. Minimal rectal wall thickening with extensive perirectal varices as well. Vascular/Lymphatic: Extensive aortoiliac atherosclerosis. No aneurysm or ectasia. The hepatic veins are poorly opacified. Dense calcification in the hepatic veins may suggest some chronic thrombosis. There is recanalization of the umbilical vein as well as caput medusa across the anterior abdomen. There are extensive upper abdominal venous collaterals including esophageal and gastric varices, as above. Evidence of splenorenal shunting is noted. The portal vein and splenic veins are patent. Question a portal venous stent. There is notable hypodense expansile filling defect extending from the left common femoral vein and greater saphenous vein into the iliac vein and IVC which extends to the level of the confluence with the renal veins. Reproductive: Prostate is unremarkable. Few adjacent varices of the right gonadal vein. Other: Moderate volume ascites. No bowel containing hernia. No abdominopelvic free air, pneumatosis or portal venous gas. Musculoskeletal: Chronic osseous manifestations of hyperparathyroidism/renal osteodystrophy. Prior changes of bilateral sacroiliitis which are progressive from prior studies. Additional erosive changes along the left symphysis pubis are similar to the comparison. No new concerning osseous lesions. IMPRESSION: 1. Wedge-shaped  regions of hypoattenuation in the central spleen and anterior spleen have an appearance suggestive of splenic infarct. 2. Stigmata of cirrhosis and portal hypertension with a markedly nodular and dysplastic appearing liver, extensive venous collateralization through the abdomen, splenomegaly, and moderate volume ascites. 3. Venous collaterals including a recanalized umbilical vein, gastric and esophageal varices, and rectal varices. 4. Calcification in the hepatic vein could reflect sequela of remote or chronic thrombosis, poorly opacified on this exam. 5. There is hypodense filling defect extending from the left lower extremity from the femoral and greater saphenous veins into the common femoral, iliac and IVC which extends to the confluence of the renal arteries. 6. Diffuse mild edematous thickening of the small bowel likely related to the moderate volume ascites and/or portal enteropathy. 7. Diffuse bladder wall thickening, possibly related to intrinsic liver disease. Recommend correlation with urinalysis to exclude cystitis. 8. Chronic osseous manifestations of hyperparathyroidism/renal osteodystrophy. 9. Prior changes of bilateral sacroiliitis which are progressive from prior studies. Additional erosive changes along the left symphysis pubis are similar to the comparison. 10. Chronic left basilar thickening calcified pleural plaque with adjacent rounded atelectasis in the left lower lobe, similar to comparison in 2019. 11. Aortic Atherosclerosis (ICD10-I70.0). Currently attempting to contact the ordering provider with a critical value result. Addendum will be submitted upon case discussion. Electronically Signed: By: Lovena Le M.D. On: 12/27/2019 03:53   ECHOCARDIOGRAM COMPLETE  Result Date: 12/29/2019    ECHOCARDIOGRAM REPORT   Patient Name:   Mario [name] Date of Exam: 12/29/2019 Medical Rec #:  [id number]                Height:       65.0 in Accession #:    [id number]               Weight:        143.3 lb Date of Birth:  06-15-1971               BSA:          1.717 m Patient Age:    54 years                 BP:           183/75 mmHg Patient Gender: M  HR:           68 bpm. Exam Location:  [facility] Procedure: 2D Echo, Color Doppler and Cardiac Doppler Indications:     R06.00 Dyspnea  History:         Patient has prior history of Echocardiogram examinations. CHF,                  ESRD; Risk Factors:Hypertension.  Sonographer:     Charmayne Sheer RDCS (AE) Referring Phys:  [id number] [facility] AMIN Diagnosing Phys: Serafina Royals MD IMPRESSIONS  1. Left ventricular ejection fraction, by estimation, is 55 to 60%. The left ventricle has normal function. The left ventricle has no regional wall motion abnormalities. Left ventricular diastolic parameters were normal.  2. Right ventricular systolic function is normal. The right ventricular size is normal.  3. Left atrial size was mildly dilated.  4. The mitral valve is myxomatous. Trivial mitral valve regurgitation. Mild mitral stenosis.  5. The aortic valve is normal in structure. Aortic valve regurgitation is trivial. FINDINGS  Left Ventricle: Left ventricular ejection fraction, by estimation, is 55 to 60%. The left ventricle has normal function. The left ventricle has no regional wall motion abnormalities. The left ventricular internal cavity size was normal in size. There is  no left ventricular hypertrophy. Left ventricular diastolic parameters were normal. Right Ventricle: The right ventricular size is normal. No increase in right ventricular wall thickness. Right ventricular systolic function is normal. Left Atrium: Left atrial size was mildly dilated. Right Atrium: Right atrial size was normal in size. Pericardium: There is no evidence of pericardial effusion. Mitral Valve: The mitral valve is myxomatous. Trivial mitral valve regurgitation. Mild mitral valve stenosis. MV peak gradient, 18.8 mmHg. The mean mitral valve gradient is 9.0 mmHg.  Tricuspid Valve: The tricuspid valve is normal in structure. Tricuspid valve regurgitation is trivial. Aortic Valve: The aortic valve is normal in structure. Aortic valve regurgitation is trivial. Aortic valve mean gradient measures 15.0 mmHg. Aortic valve peak gradient measures 33.0 mmHg. Aortic valve area, by VTI measures 1.94 cm. Pulmonic Valve: The pulmonic valve was normal in structure. Pulmonic valve regurgitation is not visualized. Aorta: The aortic root and ascending aorta are structurally normal, with no evidence of dilitation. IAS/Shunts: No atrial level shunt detected by color flow Doppler.  LEFT VENTRICLE PLAX 2D LVIDd:         5.87 cm  Diastology LVIDs:         3.31 cm  LV e' lateral:   9.14 cm/s LV PW:         1.06 cm  LV E/e' lateral: 19.8 LV IVS:        0.69 cm  LV e' medial:    5.98 cm/s LVOT diam:     2.00 cm  LV E/e' medial:  30.3 LV SV:         103 LV SV Index:   60 LVOT Area:     3.14 cm  RIGHT VENTRICLE RV Basal diam:  3.25 cm LEFT ATRIUM           Index       RIGHT ATRIUM           Index LA diam:      4.10 cm 2.39 cm/m  RA Area:     17.10 cm LA Vol (A2C): 48.9 ml 28.48 ml/m RA Volume:   46.90 ml  27.32 ml/m LA Vol (A4C): 42.3 ml 24.64 ml/m  AORTIC VALVE  PULMONIC VALVE AV Area (Vmax):    1.85 cm     PV Vmax:       2.11 m/s AV Area (Vmean):   1.80 cm     PV Vmean:      119.000 cm/s AV Area (VTI):     1.94 cm     PV VTI:        0.380 m AV Vmax:           287.33 cm/s  PV Peak grad:  17.8 mmHg AV Vmean:          176.333 cm/s PV Mean grad:  7.0 mmHg AV VTI:            0.534 m AV Peak Grad:      33.0 mmHg AV Mean Grad:      15.0 mmHg LVOT Vmax:         169.00 cm/s LVOT Vmean:        101.000 cm/s LVOT VTI:          0.329 m LVOT/AV VTI ratio: 0.62  AORTA Ao Root diam: 3.00 cm MITRAL VALVE MV Area (PHT): 2.72 cm     SHUNTS MV Peak grad:  18.8 mmHg    Systemic VTI:  0.33 m MV Mean grad:  9.0 mmHg     Systemic Diam: 2.00 cm MV Vmax:       2.17 m/s MV Vmean:      143.0 cm/s MV  Decel Time: 279 msec MV E velocity: 181.00 cm/s MV A velocity: 138.00 cm/s MV E/A ratio:  1.31 Serafina Royals MD Electronically signed by Serafina Royals MD Signature Date/Time: 12/29/2019/5:31:52 PM    Final    CT Angio Chest/Abd/Pel for Dissection W and/or W/WO  Result Date: 12/28/2019 CLINICAL DATA:  Left-sided abdominal pain. EXAM: CT ANGIOGRAPHY CHEST, ABDOMEN AND PELVIS TECHNIQUE: Non-contrast CT of the chest was initially obtained. Multidetector CT imaging through the chest, abdomen and pelvis was performed using the standard protocol during bolus administration of intravenous contrast. Multiplanar reconstructed images and MIPs were obtained and reviewed to evaluate the vascular anatomy. CONTRAST:  163mL OMNIPAQUE IOHEXOL 350 MG/ML SOLN COMPARISON:  May 01, 2018. FINDINGS: CTA CHEST FINDINGS Cardiovascular: Preferential opacification of the thoracic aorta. No evidence of thoracic aortic aneurysm or dissection. Normal heart size. No pericardial effusion. Large paraesophageal varices are noted with large collateral veins in the posterior mediastinum extending to the azygos vein. Mediastinum/Nodes: No enlarged mediastinal, hilar, or axillary lymph nodes. Thyroid gland, trachea, and esophagus demonstrate no significant findings. Lungs/Pleura: No pneumothorax is noted. Right lung is clear. Mild left posterior basilar subsegmental atelectasis is noted with possible calcified pleural plaque seen posteriorly in the left lung base. Musculoskeletal: Findings consistent with renal osteodystrophy. Probable nondisplaced fracture seen involving lateral portion of left ninth rib. Review of the MIP images confirms the above findings. CTA ABDOMEN AND PELVIS FINDINGS VASCULAR Aorta: Atherosclerosis of thoracic aorta is noted without aneurysm or dissection. Celiac: Patent without evidence of aneurysm, dissection, vasculitis or significant stenosis. SMA: Patent without evidence of aneurysm, dissection, vasculitis or  significant stenosis. Renals: Renal arteries bilaterally are severely diminished in caliber consistent with end-stage renal disease. IMA: Patent without evidence of aneurysm, dissection, vasculitis or significant stenosis. Inflow: Patent without evidence of aneurysm, dissection, vasculitis or significant stenosis. Veins: No obvious venous abnormality within the limitations of this arterial phase study. Review of the MIP images confirms the above findings. NON-VASCULAR Hepatobiliary: Hepatic cirrhosis is again noted. No biliary dilatation is noted. No cholelithiasis  is noted. Pancreas: Unremarkable. No pancreatic ductal dilatation or surrounding inflammatory changes. Spleen: Stable peripheral low density is noted in the spleen which may represent infarction of indeterminate age. Adrenals/Urinary Tract: Adrenal glands appear normal. Severely atrophic kidneys are noted bilaterally consistent with end-stage renal disease. No hydronephrosis or renal obstruction is noted. Urinary bladder is decompressed. Stomach/Bowel: Stomach is within normal limits. Appendix appears normal. No evidence of bowel wall thickening, distention, or inflammatory changes. Enlarged perirectal varices are noted which drain into enlarged left gonadal vein; some of these varices appear to extend into the lumen of the rectum. Lymphatic: No adenopathy is noted. Reproductive: Prostate is unremarkable. Other: Moderate ascites is noted. No hernia is noted. Musculoskeletal: Findings consistent with renal osteodystrophy. Degenerative changes are seen involving the pubic symphysis. No acute abnormality is noted. Review of the MIP images confirms the above findings. IMPRESSION: 1. Atherosclerosis of thoracic and abdominal aorta is noted without aneurysm or dissection. 2. Enlarged perirectal varices are noted which drain into large left gonadal vein; some of these varices appear to extend into the lumen of the rectum. 3. Large paraesophageal varices are  noted with large collateral veins in the posterior mediastinum extending to the azygos vein. 4. Hepatic cirrhosis is again noted. 5. Stable peripheral low density is noted in the spleen which may represent infarction of indeterminate age. 6. Severely atrophic kidneys are noted bilaterally consistent with end-stage renal disease. 7. Probable nondisplaced fracture seen involving lateral portion of left ninth rib. 8. Moderate ascites is noted. Electronically Signed   By: Marijo Conception M.D.   On: 12/28/2019 10:02    EKG: Normal sinus rhythm otherwise normal EKG  Weights: Filed Weights   12/26/19 2107 12/27/19 1230 12/30/19 0500  Weight: 65 kg 65 kg 67.3 kg     Physical Exam: Blood pressure (!) 158/71, pulse 72, temperature 98.5 F (36.9 C), temperature source Oral, resp. rate 16, height 5\' 5"  (1.651 m), weight 67.3 kg, SpO2 97 %. Body mass index is 24.69 kg/m. General: Well developed, well nourished, in no acute distress. Head eyes ears nose throat: Normocephalic, atraumatic, sclera non-icteric, no xanthomas, nares are without discharge. No apparent thyromegaly and/or mass  Lungs: Normal respiratory effort.  no wheezes, no rales, no rhonchi.  Heart: RRR with normal S1 S2.  2-3+ right upper sternal border and apical  murmur gallop, no rub, PMI is normal size and placement, carotid upstroke normal without bruit, jugular venous pressure is normal Abdomen: Soft, non-tender, non-distended with normoactive bowel sounds. No hepatomegaly. No rebound/guarding. No obvious abdominal masses. Abdominal aorta is normal size without bruit Extremities: Trace edema. no cyanosis, no clubbing, no ulcers  Peripheral : 2+ bilateral upper extremity pulses, 2+ bilateral femoral pulses, 2+ bilateral dorsal pedal pulse Neuro: Alert and oriented. No facial asymmetry. No focal deficit. Moves all extremities spontaneously. Musculoskeletal: Normal muscle tone without kyphosis Psych:  Responds to questions appropriately  with a normal affect.    Assessment: 49 year old with hypertension hyperlipidemia end-stage renal disease with splenic infarct of unknown etiology currently without evidence of heart failure or myocardial infarction  Plan: 1.  Continue monitor to assess for possible rhythm disturbances although no current evidence of rhythm disturbances.  Would potentially need for longer-term monitor to assess for rhythm disturbances causing above 2.  Patient will need transesophageal echocardiogram for further evaluation and treatment options of possibility of atrial septal defect patent foramen ovale valvular heart disease contributing to above 3.  Continue heparin and would consider continuation of anticoagulation with Eliquis  if no contraindication due to concerns of splenic infarct and recurrence 4.  No restrictions to dialysis at this time due to no evidence of myocardial infarction or congestive heart failure 5.  Continue hypertension control with current medical regimen without change  Signed, Corey Skains M.D. [facility] 12/30/2019, 7:53 AM

## 2019-12-30 NOTE — Progress Notes (Signed)
Hypoglycemic Event  CBG: 68  Treatment: 1 cup of apple juice  Symptoms: None  Follow-up CBG: Time: 0822 CBG Result: 113  Possible Reasons for Event: no snacks eaten over night  Comments/MD notified: yes    Mario [name]

## 2019-12-30 NOTE — Progress Notes (Signed)
PROGRESS NOTE    Mario [name]  OEV:[id number] DOB: 1970/09/23 DOA: 12/27/2019 PCP: [name], MD   Brief Narrative:  Mario [name] is a 49 y.o. male with medical history significant of hypertension, ESRD-HD (TTS), liver cirrhosis, SBP, CHF, anemia, tobacco abuse, mitral valve prolapse, who presents with left upper quadrant abdominal pain and left flank pain. Found to have splenic infarct.  Started on heparin infusion and vascular surgery was consulted.  Subjective: Patient continued to experience left upper quadrant pain.  Assessment & Plan:   Principal Problem:   Splenic infarction Active Problems:   ESRD (end stage renal disease) on dialysis (HCC)   Chronic diastolic (congestive) heart failure (HCC)   Cirrhosis (HCC)   HTN (hypertension)   Tobacco abuse   Anemia in ESRD (end-stage renal disease) (HCC)  Splenic infarction: Vascular surgery, Dr. Delana Meyer is consulted. Patient underwent CTA of chest and abdomen today, they did not find any vascular cause for his splenic infarct and they are recommending cardiology consult. -cardiology was consulted-they are recommending TEE most likely later today. -Continue Heparin infusion-can be changed to Eliquis tomorrow after TEE done. -Surgery was consulted today and they do not think that he is a good candidate for surgery at this time-recommending conservative management. -Continue pain management. -Hypercoagulable panel drawn-Antithrombin III mildly decreased at 70, cardiolipin antibodies negative.  Homocysteine borderline elevated at 26.2 and beta-2 glycoprotein IgA borderline positive. -Monitor any signs of spleen rupture  ESRD (end stage renal disease) on dialysis (TTS) -Dr. Candiss Norse of renal is consulted  Chronic diastolic (congestive) heart failure ([place]): 2D echo on 04/03/2019 showed EF of 60-65%.  Patient does not have leg edema.  No respiratory distress.  CHF seem to be compensated. -Volume management  per renal by dialysis.  Liver Cirrhosis ([place]): Mental status normal. -check INR and PTT -check ammonia level  HTN:  -Continue home medications: Amlodipine and lisinopril -hydralazine prn  Tobacco abuse -nicotine patch  Anemia in ESRD (end-stage renal disease) ([place]): Hgb stable, 11.2 -f/u by CBC  Objective: Vitals:   12/29/19 2044 12/30/19 0500 12/30/19 0500 12/30/19 1201  BP: (!) 163/72  (!) 158/71 (!) 164/80  Pulse: 72  72 68  Resp: 20  16 16   Temp: 98.9 F (37.2 C)  98.5 F (36.9 C) 98.4 F (36.9 C)  TempSrc: Oral  Oral Oral  SpO2: 100%  97% 98%  Weight:  67.3 kg    Height:        Intake/Output Summary (Last 24 hours) at 12/30/2019 1519 Last data filed at 12/30/2019 0900 Gross per 24 hour  Intake 1055.49 ml  Output 0 ml  Net 1055.49 ml   Filed Weights   12/26/19 2107 12/27/19 1230 12/30/19 0500  Weight: 65 kg 65 kg 67.3 kg    Examination:  General exam: Appears calm and comfortable  Respiratory system: Clear to auscultation. Respiratory effort normal. Cardiovascular system: S1 & S2 heard, RRR. No JVD, murmurs, rubs, gallops or clicks. Gastrointestinal system: Soft, nontender, nondistended, bowel sounds positive. Central nervous system: Alert and oriented. No focal neurological deficits.Symmetric 5 x 5 power. Extremities: No edema, no cyanosis, pulses intact and symmetrical.  Large left upper extremity fistula. Psychiatry: Judgement and insight appear normal.   DVT prophylaxis: Heparin Code Status: Full Family Communication: Discussed with patient  disposition Plan:  Status is: Inpatient   Remains inpatient appropriate because:Inpatient level of care appropriate due to severity of illness   Dispo: The patient is from: Home  Anticipated d/c is to: Home              Anticipated d/c date is: 2 days              Patient currently is not medically stable to d/c.   Consultants:   Vascular surgery  Cardiology  General  surgery.  Procedures:  Antimicrobials:   Data Reviewed: I have personally reviewed following labs and imaging studies  CBC: Recent Labs  Lab 12/26/19 2109 12/27/19 1922 12/28/19 0443 12/29/19 0449 12/30/19 0438  WBC 6.4 4.4 4.5 4.0 4.3  HGB 11.2* 10.8* 10.9* 10.7* 11.1*  HCT 33.4* 34.0* 32.6* 33.2* 33.5*  MCV 96.3 99.1 97.0 99.1 96.8  PLT 126* 111* 111* 106* 423*   Basic Metabolic Panel: Recent Labs  Lab 12/26/19 2109 12/27/19 1253 12/28/19 0443  NA 136  --  137  K 4.8  --  4.9  CL 92*  --  97*  CO2 26  --  28  GLUCOSE 99  --  79  BUN 49*  --  25*  CREATININE 10.23*  --  6.49*  CALCIUM 7.7*  --  8.4*  PHOS  --  6.5*  --    GFR: Estimated Creatinine Clearance (by C-G formula based on SCr of 6.49 mg/dL (H)) Male: 9.4 mL/min (A) Male: 12 mL/min (A) Liver Function Tests: Recent Labs  Lab 12/27/19 0245  AST 25  ALT 15  ALKPHOS 163*  BILITOT 1.1  PROT 7.1  ALBUMIN 3.6   Recent Labs  Lab 12/26/19 2109  LIPASE 36   Recent Labs  Lab 12/27/19 1112  AMMONIA 39*   Coagulation Profile: Recent Labs  Lab 12/27/19 0245  INR 1.3*   Cardiac Enzymes: No results for input(s): CKTOTAL, CKMB, CKMBINDEX, TROPONINI in the last 168 hours. BNP (last 3 results) No results for input(s): PROBNP in the last 8760 hours. HbA1C: No results for input(s): HGBA1C in the last 72 hours. CBG: Recent Labs  Lab 12/29/19 0804 12/29/19 0851 12/30/19 0746 12/30/19 0822  GLUCAP 66* 94 68* 113*   Lipid Profile: No results for input(s): CHOL, HDL, LDLCALC, TRIG, CHOLHDL, LDLDIRECT in the last 72 hours. Thyroid Function Tests: No results for input(s): TSH, T4TOTAL, FREET4, T3FREE, THYROIDAB in the last 72 hours. Anemia Panel: No results for input(s): VITAMINB12, FOLATE, FERRITIN, TIBC, IRON, RETICCTPCT in the last 72 hours. Sepsis Labs: No results for input(s): PROCALCITON, LATICACIDVEN in the last 168 hours.  Recent Results (from the past 240 hour(s))  SARS Coronavirus  2 by RT PCR (hospital order, performed in [facility] hospital lab) Nasopharyngeal Nasopharyngeal Swab     Status: None   Collection Time: 12/27/19  4:38 AM   Specimen: Nasopharyngeal Swab  Result Value Ref Range Status   SARS Coronavirus 2 NEGATIVE NEGATIVE Final    Comment: (NOTE) SARS-CoV-2 target nucleic acids are NOT DETECTED. The SARS-CoV-2 RNA is generally detectable in upper and lower respiratory specimens during the acute phase of infection. The lowest concentration of SARS-CoV-2 viral copies this assay can detect is 250 copies / mL. A negative result does not preclude SARS-CoV-2 infection and should not be used as the sole basis for treatment or other patient management decisions.  A negative result may occur with improper specimen collection / handling, submission of specimen other than nasopharyngeal swab, presence of viral mutation(s) within the areas targeted by this assay, and inadequate number of viral copies (<250 copies / mL). A negative result must be combined with clinical observations, patient history,  and epidemiological information. Fact Sheet for Patients:   StrictlyIdeas.no Fact Sheet for Healthcare Providers: BankingDealers.co.za This test is not yet approved or cleared  by the [place] FDA and has been authorized for detection and/or diagnosis of SARS-CoV-2 by FDA under an Emergency Use Authorization (EUA).  This EUA will remain in effect (meaning this test can be used) for the duration of the COVID-19 declaration under Section 564(b)(1) of the Act, 21 U.S.C. section 360bbb-3(b)(1), unless the authorization is terminated or revoked sooner. Performed at [facility], [address] [place], [place] [address]      Radiology Studies: ECHOCARDIOGRAM COMPLETE  Result Date: 12/29/2019    ECHOCARDIOGRAM REPORT   Patient Name:   KEIGAN TAFOYA Date of Exam: 12/29/2019 Medical Rec #:   [id number]                Height:       65.0 in Accession #:    [id number]               Weight:       143.3 lb Date of Birth:  [birth year]               BSA:          1.717 m Patient Age:    39 years                 BP:           183/75 mmHg Patient Gender: M                        HR:           68 bpm. Exam Location:  [facility] Procedure: 2D Echo, Color Doppler and Cardiac Doppler Indications:     R06.00 Dyspnea  History:         Patient has prior history of Echocardiogram examinations. CHF,                  ESRD; Risk Factors:Hypertension.  Sonographer:     Charmayne Sheer RDCS (AE) Referring Phys:  [id number] [facility] Keslee Harrington Diagnosing Phys: Serafina Royals MD IMPRESSIONS  1. Left ventricular ejection fraction, by estimation, is 55 to 60%. The left ventricle has normal function. The left ventricle has no regional wall motion abnormalities. Left ventricular diastolic parameters were normal.  2. Right ventricular systolic function is normal. The right ventricular size is normal.  3. Left atrial size was mildly dilated.  4. The mitral valve is myxomatous. Trivial mitral valve regurgitation. Mild mitral stenosis.  5. The aortic valve is normal in structure. Aortic valve regurgitation is trivial. FINDINGS  Left Ventricle: Left ventricular ejection fraction, by estimation, is 55 to 60%. The left ventricle has normal function. The left ventricle has no regional wall motion abnormalities. The left ventricular internal cavity size was normal in size. There is  no left ventricular hypertrophy. Left ventricular diastolic parameters were normal. Right Ventricle: The right ventricular size is normal. No increase in right ventricular wall thickness. Right ventricular systolic function is normal. Left Atrium: Left atrial size was mildly dilated. Right Atrium: Right atrial size was normal in size. Pericardium: There is no evidence of pericardial effusion. Mitral Valve: The mitral valve is myxomatous. Trivial mitral valve regurgitation. Mild  mitral valve stenosis. MV peak gradient, 18.8 mmHg. The mean mitral valve gradient is 9.0 mmHg. Tricuspid Valve: The tricuspid valve is normal in structure. Tricuspid valve regurgitation is trivial. Aortic Valve:  The aortic valve is normal in structure. Aortic valve regurgitation is trivial. Aortic valve mean gradient measures 15.0 mmHg. Aortic valve peak gradient measures 33.0 mmHg. Aortic valve area, by VTI measures 1.94 cm. Pulmonic Valve: The pulmonic valve was normal in structure. Pulmonic valve regurgitation is not visualized. Aorta: The aortic root and ascending aorta are structurally normal, with no evidence of dilitation. IAS/Shunts: No atrial level shunt detected by color flow Doppler.  LEFT VENTRICLE PLAX 2D LVIDd:         5.87 cm  Diastology LVIDs:         3.31 cm  LV e' lateral:   9.14 cm/s LV PW:         1.06 cm  LV E/e' lateral: 19.8 LV IVS:        0.69 cm  LV e' medial:    5.98 cm/s LVOT diam:     2.00 cm  LV E/e' medial:  30.3 LV SV:         103 LV SV Index:   60 LVOT Area:     3.14 cm  RIGHT VENTRICLE RV Basal diam:  3.25 cm LEFT ATRIUM           Index       RIGHT ATRIUM           Index LA diam:      4.10 cm 2.39 cm/m  RA Area:     17.10 cm LA Vol (A2C): 48.9 ml 28.48 ml/m RA Volume:   46.90 ml  27.32 ml/m LA Vol (A4C): 42.3 ml 24.64 ml/m  AORTIC VALVE                    PULMONIC VALVE AV Area (Vmax):    1.85 cm     PV Vmax:       2.11 m/s AV Area (Vmean):   1.80 cm     PV Vmean:      119.000 cm/s AV Area (VTI):     1.94 cm     PV VTI:        0.380 m AV Vmax:           287.33 cm/s  PV Peak grad:  17.8 mmHg AV Vmean:          176.333 cm/s PV Mean grad:  7.0 mmHg AV VTI:            0.534 m AV Peak Grad:      33.0 mmHg AV Mean Grad:      15.0 mmHg LVOT Vmax:         169.00 cm/s LVOT Vmean:        101.000 cm/s LVOT VTI:          0.329 m LVOT/AV VTI ratio: 0.62  AORTA Ao Root diam: 3.00 cm MITRAL VALVE MV Area (PHT): 2.72 cm     SHUNTS MV Peak grad:  18.8 mmHg    Systemic VTI:  0.33 m MV Mean  grad:  9.0 mmHg     Systemic Diam: 2.00 cm MV Vmax:       2.17 m/s MV Vmean:      143.0 cm/s MV Decel Time: 279 msec MV E velocity: 181.00 cm/s MV A velocity: 138.00 cm/s MV E/A ratio:  1.31 Serafina Royals MD Electronically signed by Serafina Royals MD Signature Date/Time: 12/29/2019/5:31:52 PM    Final     Scheduled Meds: . Chlorhexidine Gluconate Cloth  6 each Topical Q0600  . ferric citrate  420 mg Oral TID WC  . irbesartan  300 mg Oral QHS  . nicotine  21 mg Transdermal Daily   Continuous Infusions: . heparin 1,200 Units/hr (12/30/19 0635)     LOS: 3 days   Time spent: 40 minutes.  Lorella Nimrod, MD [facility]  If 7PM-7AM, please contact night-coverage [email or web address]  12/30/2019, 3:19 PM   This record has been created using Systems analyst. Errors have been sought and corrected,but may not always be located. Such creation errors do not reflect on the standard of care.

## 2019-12-31 LAB — CBC
HCT: 34.4 % — ABNORMAL LOW (ref 39.0–52.0)
Hemoglobin: 11.2 g/dL — ABNORMAL LOW (ref 13.0–17.0)
MCH: 32.4 pg (ref 26.0–34.0)
MCHC: 32.6 g/dL (ref 30.0–36.0)
MCV: 99.4 fL (ref 80.0–100.0)
Platelets: 95 10*3/uL — ABNORMAL LOW (ref 150–400)
RBC: 3.46 MIL/uL — ABNORMAL LOW (ref 4.22–5.81)
RDW: 14.1 % (ref 11.5–15.5)
WBC: 4.1 10*3/uL (ref 4.0–10.5)
nRBC: 0 % (ref 0.0–0.2)

## 2019-12-31 LAB — HEPARIN LEVEL (UNFRACTIONATED): Heparin Unfractionated: 0.36 IU/mL (ref 0.30–0.70)

## 2019-12-31 LAB — GLUCOSE, CAPILLARY
Glucose-Capillary: 67 mg/dL — ABNORMAL LOW (ref 70–99)
Glucose-Capillary: 99 mg/dL (ref 70–99)

## 2019-12-31 MED ORDER — IRBESARTAN 300 MG PO TABS: 300.0000 mg | ORAL_TABLET | Freq: Every day | ORAL | 1 refills | Status: DC

## 2019-12-31 MED ORDER — APIXABAN 5 MG PO TABS: 5.0000 mg | ORAL_TABLET | Freq: Two times a day (BID) | ORAL | 1 refills | Status: DC

## 2019-12-31 MED ORDER — LIDOCAINE 5 % EX PTCH
1.0000 | MEDICATED_PATCH | CUTANEOUS | Status: DC
  Administered 2019-12-31: 1 via TRANSDERMAL
  Filled 2019-12-31: qty 1

## 2019-12-31 MED ORDER — APIXABAN 5 MG PO TABS
5.0000 mg | ORAL_TABLET | Freq: Two times a day (BID) | ORAL | Status: DC
  Administered 2019-12-31: 5 mg via ORAL
  Filled 2019-12-31: qty 1

## 2019-12-31 MED ORDER — OXYCODONE HCL 5 MG PO TABS: 5.0000 mg | ORAL_TABLET | Freq: Four times a day (QID) | ORAL | 0 refills | Status: DC | PRN

## 2019-12-31 MED ORDER — LIDOCAINE 5 % EX PTCH: 1.0000 | MEDICATED_PATCH | CUTANEOUS | 0 refills | Status: DC

## 2019-12-31 NOTE — Progress Notes (Signed)
Patient is back from HD. BP 192/78 MD Amin made aware. IV PRN Hydralazine will be given.

## 2019-12-31 NOTE — Progress Notes (Signed)
ANTICOAGULATION CONSULT NOTE - Initial Consult  Pharmacy Consult for apixaban Indication: Splenic infarct most likely embolic  Allergies  Allergen Reactions  . Betadine [Povidone Iodine] Rash    Patient Measurements: Height: 5\' 5"  (165.1 cm) Weight: 67.6 kg (149 lb 0.5 oz) IBW/kg (Calculated) : 61.5 Heparin Dosing Weight:    Vital Signs: Temp: 97.6 F (36.4 C) (05/29 0556) Temp Source: Oral (05/29 0556) BP: 165/81 (05/29 0556) Pulse Rate: 80 (05/29 0556)  Labs: Recent Labs    12/29/19 0449 12/29/19 0449 12/30/19 0438 12/30/19 0438 12/30/19 1248 12/30/19 1950 12/31/19 0743  HGB 10.7*   < > 11.1*  --   --   --  11.2*  HCT 33.2*  --  33.5*  --   --   --  34.4*  PLT 106*  --  106*  --   --   --  95*  HEPARINUNFRC 0.37   < > 0.25*   < > 0.39 0.43 0.36   < > = values in this interval not displayed.    Estimated Creatinine Clearance (by C-G formula based on SCr of 6.49 mg/dL (H)) Male: 9.4 mL/min (A) Male: 12 mL/min (A)   Medical History: Past Medical History:  Diagnosis Date  . Abdominal pain 08/28/2016  . Acute hyperkalemia 10/28/2016  . Chest pain 04/11/2016  . Chronic combined systolic and diastolic CHF (congestive heart failure) ([place])   . Cirrhosis ([place])   . Dialysis patient ([place])   . ESRD (end stage renal disease) on dialysis ([place]) 01/25/2015  . Fluid overload 04/12/2016  . HTN (hypertension) 10/13/2016  . Hyperkalemia 11/27/2016  . Hypertension   . Mitral regurgitation   . Pulmonary edema 07/03/2016  . Renal disorder   . Renal insufficiency   . Scrotal infection 01/25/2015    Medications:  Scheduled:  . apixaban  5 mg Oral BID  . Chlorhexidine Gluconate Cloth  6 each Topical Q0600  . ferric citrate  420 mg Oral TID WC  . irbesartan  300 mg Oral QHS  . nicotine  21 mg Transdermal Daily   Infusions:    Assessment: 49 yo M to transition from Heparin drip to apixaban. See Cardiology note. Myxomatous mitral valve possibly consistent with previous  rheumatic fever and a possible source of embolism either from the valve and or the possibility of atrial fibrillation.  -Hemodialysis pt  Goal of Therapy:  Monitor platelets by anticoagulation protocol: Yes   Plan:  Will order apixaban 5 mg po BID and Heparin drip to be discontinued.  Suren Payne A 12/31/2019,9:23 AM

## 2019-12-31 NOTE — Discharge Summary (Signed)
Physician Discharge Summary  Mario [name] FBP:[id number] DOB: 14-Nov-1970 DOA: 12/27/2019  PCP: [name], MD  Admit date: 12/27/2019 Discharge date: 12/31/2019  Admitted From: Home Disposition: Home   Recommendations for Outpatient Follow-up:  1. Follow up with PCP in 1-2 weeks 2. Follow-up with cardiology 3. Follow-up with your dialysis center 4. Please obtain BMP/CBC in one week 5. Please follow up on the following pending results: None  Home Health: No Equipment/Devices: None Discharge Condition: Stable CODE STATUS: Full Diet recommendation: Heart Healthy / Carb Modified   Brief/Interim Summary: Mario [name] a 49 y.o.malewith medical history significant of hypertension, ESRD-HD (TTS), liver cirrhosis, SBP, CHF, anemia, tobacco abuse, mitral valve prolapse, who presents with left upper quadrant abdominal pain and left flank pain. Found to have splenic infarct.  Started on heparin infusion and vascular surgery was consulted. Patient underwent CTA of chest and abdomen , they did not find any vascular cause for his splenic infarct and they are recommending cardiology consult. 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  That can be a possible source of emboli for his current splenic infarct.  They did recommended doing TEE.  Unfortunately that was not done during current hospitalization and cardiology suggested to continue with Eliquis and they will follow him up as an outpatient for further management.  He might need a long-term monitor to rule out any A. Fib.  Clinically patient improved with improvement in his pain.  Continue to have tenderness along the left upper quadrant and left lower chest.  He also has a rib fracture that might be contributory.  He was given a lidocaine patch and oxycodone for pain on discharge and will need to follow-up with his primary care  physician.  Patient currently have well compensated liver cirrhosis.  He will continue with his supportive care.  His blood pressure remained elevated.  We restarted his home dose of amlodipine and start him on irbesartan instead of lisinopril.  He needs a close follow-up for better control of his blood pressure.  He has anemia of chronic disease secondary to end-stage renal disease.  His hemoglobin remained stable.  We will continue with his scheduled dialysis.   Discharge Diagnoses:  Principal Problem:   Splenic infarction Active Problems:   ESRD (end stage renal disease) on dialysis (HCC)   Chronic diastolic (congestive) heart failure (HCC)   Cirrhosis (HCC)   HTN (hypertension)   Tobacco abuse   Anemia in ESRD (end-stage renal disease) ([place])  Discharge Instructions  Discharge Instructions    Diet - low sodium heart healthy   Complete by: As directed    Discharge instructions   Complete by: As directed    It was pleasure taking care of you. You are being given a blood thinner for the clot in your artery, please take all the medications as directed. Please follow-up with cardiology and your primary care physician for further recommendations. Continue going for your dialysis according to your schedule.   Increase activity slowly   Complete by: As directed      Allergies as of 12/31/2019      Reactions   Betadine [povidone Iodine] Rash      Medication List    STOP taking these medications   cyclobenzaprine 10 MG tablet Commonly known as: FLEXERIL   lisinopril 20 MG tablet Commonly known as: ZESTRIL     TAKE these medications   acetaminophen 500 MG tablet Commonly known  as: TYLENOL Take 500 mg by mouth every 6 (six) hours as needed for mild pain.   amLODipine 10 MG tablet Commonly known as: NORVASC Take 1 tablet (10 mg total) by mouth daily.   apixaban 5 MG Tabs tablet Commonly known as: ELIQUIS Take 1 tablet (5 mg total) by mouth 2 (two) times daily.    Auryxia 1 GM 210 MG(Fe) tablet Generic drug: ferric citrate Take 420 mg by mouth 3 (three) times daily with meals.   irbesartan 300 MG tablet Commonly known as: AVAPRO Take 1 tablet (300 mg total) by mouth at bedtime.   lidocaine 5 % Commonly known as: LIDODERM Place 1 patch onto the skin daily. Remove & Discard patch within 12 hours or as directed by MD   oxyCODONE 5 MG immediate release tablet Commonly known as: Oxy IR/ROXICODONE Take 1 tablet (5 mg total) by mouth every 6 (six) hours as needed for up to 15 doses for moderate pain or severe pain. What changed: reasons to take this      Follow-up Information    Corey Skains, MD. Schedule an appointment as soon as possible for a visit.   Specialty: Cardiology Contact information: [address] [facility] [place] [address] [phone]        Lateef, Munsoor, MD. Schedule an appointment as soon as possible for a visit.   Specialty: Nephrology Contact information: [place] [address] [phone]        Dorothy Spark, MD .   Specialty: Cardiology Contact information: [place] [address] [phone]          Allergies  Allergen Reactions  . Betadine [Povidone Iodine] Rash    Consultations:  Neurosurgery  Cardiology  Nephrology  Procedures/Studies: DG Chest 2 View  Result Date: 12/27/2019 CLINICAL DATA:  Left upper quadrant pain. Vomiting. EXAM: CHEST - 2 VIEW COMPARISON:  Chest radiograph 03/24/2019 FINDINGS: Unchanged heart size and mediastinal contours. Peribronchial thickening. Suspected small left pleural effusion. Subsegmental atelectasis at the bases. No pneumothorax. Surgical hardware in the cervical spine is partially included. IMPRESSION: 1. Mild cardiomegaly. Peribronchial thickening may be bronchitic or congestive. 2. Suspected small left pleural effusion. Electronically Signed   By: Keith Rake M.D.   On: 12/27/2019 02:09   CT Abdomen Pelvis W Contrast  Addendum Date: 12/27/2019   ADDENDUM REPORT: 12/27/2019 03:58 ADDENDUM: These results were called by telephone on 12/27/2019 at 3:58 am to provider JADE SUNG , who verbally acknowledged these results. Electronically Signed   By: Lovena Le M.D.   On: 12/27/2019 03:58   Result Date: 12/27/2019 CLINICAL DATA:  Nausea vomiting, left-sided abdominal pain, recent dialysis 2 days prior EXAM: CT ABDOMEN AND PELVIS WITH CONTRAST TECHNIQUE: Multidetector CT imaging of the abdomen and pelvis was performed using the standard protocol following bolus administration of intravenous contrast. CONTRAST:  166mL OMNIPAQUE IOHEXOL 300 MG/ML  SOLN COMPARISON:  CT 05/01/2018 FINDINGS: Lower chest: Dependent atelectatic changes noted in the lung bases. Chronic left basilar thickening calcified pleural plaque with adjacent rounded atelectasis in the left lower lobe, similar to comparison exam. No new consolidative opacity. Mild cardiomegaly. Dense calcification of the mitral annulus and upon the chordae tendinae. Apical thinning of the left ventricle with calcification may reflect sequela of prior infarct. Coronary artery calcifications are present. No pericardial effusion. Hepatobiliary: There is a diffusely did dysplastic appearance throughout the liver with a markedly nodular liver surface contour, similar to  comparison exams. This likely reflect sequela of chronic fibrotic changes of cirrhosis. No focal liver lesion. Mild gallbladder wall thickening is nonspecific in the setting of intrinsic liver disease. No visible calcified gallstones or biliary ductal dilatation. Pancreas: Unremarkable. No pancreatic ductal dilatation or surrounding inflammatory changes. Spleen: Wedge-shaped regions of hypoattenuation in the central spleen and anterior spleen (2/39 for example) have an appearance suggestive of splenic infarct. Splenomegaly is noted. Adrenals/Urinary Tract:  Normal adrenal glands. Diffusely atrophic and dysplastic appearance of the kidneys with multiple small cysts of mixed attenuation many of which are unchanged from comparison in 2019. Findings are compatible with chronic dialysis dependence. No hydronephrosis or urolithiasis. No new concerning renal lesion. Urinary bladder is largely decompressed at the time of exam and therefore poorly evaluated by CT imaging. Diffuse bladder wall thickening is noted. A calcification seen along the and anterior bladder wall is unchanged prior. Stomach/Bowel: Extensive paraesophageal varices. Gastric varices are more mild. Diffuse mild edematous thickening of the small bowel likely related to the moderate volume ascites and or portal enteropathy. A normal appendix is visualized. No colonic dilatation or wall thickening. Moderate stool burden. Minimal rectal wall thickening with extensive perirectal varices as well. Vascular/Lymphatic: Extensive aortoiliac atherosclerosis. No aneurysm or ectasia. The hepatic veins are poorly opacified. Dense calcification in the hepatic veins may suggest some chronic thrombosis. There is recanalization of the umbilical vein as well as caput medusa across the anterior abdomen. There are extensive upper abdominal venous collaterals including esophageal and gastric varices, as above. Evidence of splenorenal shunting is noted. The portal vein and splenic veins are patent. Question a portal venous stent. There is notable hypodense expansile filling defect extending from the left common femoral vein and greater saphenous vein into the iliac vein and IVC which extends to the level of the confluence with the renal veins. Reproductive: Prostate is unremarkable. Few adjacent varices of the right gonadal vein. Other: Moderate volume ascites. No bowel containing hernia. No abdominopelvic free air, pneumatosis or portal venous gas. Musculoskeletal: Chronic osseous manifestations of hyperparathyroidism/renal  osteodystrophy. Prior changes of bilateral sacroiliitis which are progressive from prior studies. Additional erosive changes along the left symphysis pubis are similar to the comparison. No new concerning osseous lesions. IMPRESSION: 1. Wedge-shaped regions of hypoattenuation in the central spleen and anterior spleen have an appearance suggestive of splenic infarct. 2. Stigmata of cirrhosis and portal hypertension with a markedly nodular and dysplastic appearing liver, extensive venous collateralization through the abdomen, splenomegaly, and moderate volume ascites. 3. Venous collaterals including a recanalized umbilical vein, gastric and esophageal varices, and rectal varices. 4. Calcification in the hepatic vein could reflect sequela of remote or chronic thrombosis, poorly opacified on this exam. 5. There is hypodense filling defect extending from the left lower extremity from the femoral and greater saphenous veins into the common femoral, iliac and IVC which extends to the confluence of the renal arteries. 6. Diffuse mild edematous thickening of the small bowel likely related to the moderate volume ascites and/or portal enteropathy. 7. Diffuse bladder wall thickening, possibly related to intrinsic liver disease. Recommend correlation with urinalysis to exclude cystitis. 8. Chronic osseous manifestations of hyperparathyroidism/renal osteodystrophy. 9. Prior changes of bilateral sacroiliitis which are progressive from prior studies. Additional erosive changes along the left symphysis pubis are similar to the comparison. 10. Chronic left basilar thickening calcified pleural plaque with adjacent rounded atelectasis in the left lower lobe, similar to comparison in 2019. 11. Aortic Atherosclerosis (ICD10-I70.0). Currently attempting to contact the ordering provider with a  critical value result. Addendum will be submitted upon case discussion. Electronically Signed: By: Lovena Le M.D. On: 12/27/2019 03:53    ECHOCARDIOGRAM COMPLETE  Result Date: 12/29/2019    ECHOCARDIOGRAM REPORT   Patient Name:   Izaan AMADO Dessie Coma Date of Exam: 12/29/2019 Medical Rec #:  [id number]                Height:       65.0 in Accession #:    [id number]               Weight:       143.3 lb Date of Birth:  Mar 24, 1971               BSA:          1.717 m Patient Age:    69 years                 BP:           183/75 mmHg Patient Gender: M                        HR:           68 bpm. Exam Location:  [facility] Procedure: 2D Echo, Color Doppler and Cardiac Doppler Indications:     R06.00 Dyspnea  History:         Patient has prior history of Echocardiogram examinations. CHF,                  ESRD; Risk Factors:Hypertension.  Sonographer:     Charmayne Sheer RDCS (AE) Referring Phys:  [id number] [facility] Geanie Pacifico Diagnosing Phys: Serafina Royals MD IMPRESSIONS  1. Left ventricular ejection fraction, by estimation, is 55 to 60%. The left ventricle has normal function. The left ventricle has no regional wall motion abnormalities. Left ventricular diastolic parameters were normal.  2. Right ventricular systolic function is normal. The right ventricular size is normal.  3. Left atrial size was mildly dilated.  4. The mitral valve is myxomatous. Trivial mitral valve regurgitation. Mild mitral stenosis.  5. The aortic valve is normal in structure. Aortic valve regurgitation is trivial. FINDINGS  Left Ventricle: Left ventricular ejection fraction, by estimation, is 55 to 60%. The left ventricle has normal function. The left ventricle has no regional wall motion abnormalities. The left ventricular internal cavity size was normal in size. There is  no left ventricular hypertrophy. Left ventricular diastolic parameters were normal. Right Ventricle: The right ventricular size is normal. No increase in right ventricular wall thickness. Right ventricular systolic function is normal. Left Atrium: Left atrial size was mildly dilated. Right Atrium: Right atrial size was  normal in size. Pericardium: There is no evidence of pericardial effusion. Mitral Valve: The mitral valve is myxomatous. Trivial mitral valve regurgitation. Mild mitral valve stenosis. MV peak gradient, 18.8 mmHg. The mean mitral valve gradient is 9.0 mmHg. Tricuspid Valve: The tricuspid valve is normal in structure. Tricuspid valve regurgitation is trivial. Aortic Valve: The aortic valve is normal in structure. Aortic valve regurgitation is trivial. Aortic valve mean gradient measures 15.0 mmHg. Aortic valve peak gradient measures 33.0 mmHg. Aortic valve area, by VTI measures 1.94 cm. Pulmonic Valve: The pulmonic valve was normal in structure. Pulmonic valve regurgitation is not visualized. Aorta: The aortic root and ascending aorta are structurally normal, with no evidence of dilitation. IAS/Shunts: No atrial level shunt detected by color flow Doppler.  LEFT VENTRICLE PLAX 2D LVIDd:  5.87 cm  Diastology LVIDs:         3.31 cm  LV e' lateral:   9.14 cm/s LV PW:         1.06 cm  LV E/e' lateral: 19.8 LV IVS:        0.69 cm  LV e' medial:    5.98 cm/s LVOT diam:     2.00 cm  LV E/e' medial:  30.3 LV SV:         103 LV SV Index:   60 LVOT Area:     3.14 cm  RIGHT VENTRICLE RV Basal diam:  3.25 cm LEFT ATRIUM           Index       RIGHT ATRIUM           Index LA diam:      4.10 cm 2.39 cm/m  RA Area:     17.10 cm LA Vol (A2C): 48.9 ml 28.48 ml/m RA Volume:   46.90 ml  27.32 ml/m LA Vol (A4C): 42.3 ml 24.64 ml/m  AORTIC VALVE                    PULMONIC VALVE AV Area (Vmax):    1.85 cm     PV Vmax:       2.11 m/s AV Area (Vmean):   1.80 cm     PV Vmean:      119.000 cm/s AV Area (VTI):     1.94 cm     PV VTI:        0.380 m AV Vmax:           287.33 cm/s  PV Peak grad:  17.8 mmHg AV Vmean:          176.333 cm/s PV Mean grad:  7.0 mmHg AV VTI:            0.534 m AV Peak Grad:      33.0 mmHg AV Mean Grad:      15.0 mmHg LVOT Vmax:         169.00 cm/s LVOT Vmean:        101.000 cm/s LVOT VTI:           0.329 m LVOT/AV VTI ratio: 0.62  AORTA Ao Root diam: 3.00 cm MITRAL VALVE MV Area (PHT): 2.72 cm     SHUNTS MV Peak grad:  18.8 mmHg    Systemic VTI:  0.33 m MV Mean grad:  9.0 mmHg     Systemic Diam: 2.00 cm MV Vmax:       2.17 m/s MV Vmean:      143.0 cm/s MV Decel Time: 279 msec MV E velocity: 181.00 cm/s MV A velocity: 138.00 cm/s MV E/A ratio:  1.31 Serafina Royals MD Electronically signed by Serafina Royals MD Signature Date/Time: 12/29/2019/5:31:52 PM    Final    CT Angio Chest/Abd/Pel for Dissection W and/or W/WO  Result Date: 12/28/2019 CLINICAL DATA:  Left-sided abdominal pain. EXAM: CT ANGIOGRAPHY CHEST, ABDOMEN AND PELVIS TECHNIQUE: Non-contrast CT of the chest was initially obtained. Multidetector CT imaging through the chest, abdomen and pelvis was performed using the standard protocol during bolus administration of intravenous contrast. Multiplanar reconstructed images and MIPs were obtained and reviewed to evaluate the vascular anatomy. CONTRAST:  131mL OMNIPAQUE IOHEXOL 350 MG/ML SOLN COMPARISON:  May 01, 2018. FINDINGS: CTA CHEST FINDINGS Cardiovascular: Preferential opacification of the thoracic aorta. No evidence of thoracic aortic aneurysm or dissection. Normal heart size. No  pericardial effusion. Large paraesophageal varices are noted with large collateral veins in the posterior mediastinum extending to the azygos vein. Mediastinum/Nodes: No enlarged mediastinal, hilar, or axillary lymph nodes. Thyroid gland, trachea, and esophagus demonstrate no significant findings. Lungs/Pleura: No pneumothorax is noted. Right lung is clear. Mild left posterior basilar subsegmental atelectasis is noted with possible calcified pleural plaque seen posteriorly in the left lung base. Musculoskeletal: Findings consistent with renal osteodystrophy. Probable nondisplaced fracture seen involving lateral portion of left ninth rib. Review of the MIP images confirms the above findings. CTA ABDOMEN AND PELVIS  FINDINGS VASCULAR Aorta: Atherosclerosis of thoracic aorta is noted without aneurysm or dissection. Celiac: Patent without evidence of aneurysm, dissection, vasculitis or significant stenosis. SMA: Patent without evidence of aneurysm, dissection, vasculitis or significant stenosis. Renals: Renal arteries bilaterally are severely diminished in caliber consistent with end-stage renal disease. IMA: Patent without evidence of aneurysm, dissection, vasculitis or significant stenosis. Inflow: Patent without evidence of aneurysm, dissection, vasculitis or significant stenosis. Veins: No obvious venous abnormality within the limitations of this arterial phase study. Review of the MIP images confirms the above findings. NON-VASCULAR Hepatobiliary: Hepatic cirrhosis is again noted. No biliary dilatation is noted. No cholelithiasis is noted. Pancreas: Unremarkable. No pancreatic ductal dilatation or surrounding inflammatory changes. Spleen: Stable peripheral low density is noted in the spleen which may represent infarction of indeterminate age. Adrenals/Urinary Tract: Adrenal glands appear normal. Severely atrophic kidneys are noted bilaterally consistent with end-stage renal disease. No hydronephrosis or renal obstruction is noted. Urinary bladder is decompressed. Stomach/Bowel: Stomach is within normal limits. Appendix appears normal. No evidence of bowel wall thickening, distention, or inflammatory changes. Enlarged perirectal varices are noted which drain into enlarged left gonadal vein; some of these varices appear to extend into the lumen of the rectum. Lymphatic: No adenopathy is noted. Reproductive: Prostate is unremarkable. Other: Moderate ascites is noted. No hernia is noted. Musculoskeletal: Findings consistent with renal osteodystrophy. Degenerative changes are seen involving the pubic symphysis. No acute abnormality is noted. Review of the MIP images confirms the above findings. IMPRESSION: 1. Atherosclerosis of  thoracic and abdominal aorta is noted without aneurysm or dissection. 2. Enlarged perirectal varices are noted which drain into large left gonadal vein; some of these varices appear to extend into the lumen of the rectum. 3. Large paraesophageal varices are noted with large collateral veins in the posterior mediastinum extending to the azygos vein. 4. Hepatic cirrhosis is again noted. 5. Stable peripheral low density is noted in the spleen which may represent infarction of indeterminate age. 6. Severely atrophic kidneys are noted bilaterally consistent with end-stage renal disease. 7. Probable nondisplaced fracture seen involving lateral portion of left ninth rib. 8. Moderate ascites is noted. Electronically Signed   By: Marijo Conception M.D.   On: 12/28/2019 10:02     Subjective:   Discharge Exam: Vitals:   12/30/19 2056 12/31/19 0556  BP: (!) 172/84 (!) 165/81  Pulse: 67 80  Resp: 15 16  Temp: 98.6 F (37 C) 97.6 F (36.4 C)  SpO2: 100% 97%   Vitals:   12/30/19 1201 12/30/19 2056 12/31/19 0500 12/31/19 0556  BP: (!) 164/80 (!) 172/84  (!) 165/81  Pulse: 68 67  80  Resp: 16 15  16   Temp: 98.4 F (36.9 C) 98.6 F (37 C)  97.6 F (36.4 C)  TempSrc: Oral Oral  Oral  SpO2: 98% 100%  97%  Weight:   67.6 kg   Height:  General: Pt is alert, awake, not in acute distress Cardiovascular: RRR, S1/S2 +, no rubs, no gallops Respiratory: CTA bilaterally, no wheezing, no rhonchi Abdominal: Soft, mild left upper quadrant tenderness, ND, bowel sounds + Extremities: no edema, no cyanosis   The results of significant diagnostics from this hospitalization (including imaging, microbiology, ancillary and laboratory) are listed below for reference.    Microbiology: Recent Results (from the past 240 hour(s))  SARS Coronavirus 2 by RT PCR (hospital order, performed in [facility] hospital lab) Nasopharyngeal Nasopharyngeal Swab     Status: None   Collection Time: 12/27/19  4:38 AM    Specimen: Nasopharyngeal Swab  Result Value Ref Range Status   SARS Coronavirus 2 NEGATIVE NEGATIVE Final    Comment: (NOTE) SARS-CoV-2 target nucleic acids are NOT DETECTED. The SARS-CoV-2 RNA is generally detectable in upper and lower respiratory specimens during the acute phase of infection. The lowest concentration of SARS-CoV-2 viral copies this assay can detect is 250 copies / mL. A negative result does not preclude SARS-CoV-2 infection and should not be used as the sole basis for treatment or other patient management decisions.  A negative result may occur with improper specimen collection / handling, submission of specimen other than nasopharyngeal swab, presence of viral mutation(s) within the areas targeted by this assay, and inadequate number of viral copies (<250 copies / mL). A negative result must be combined with clinical observations, patient history, and epidemiological information. Fact Sheet for Patients:   StrictlyIdeas.no Fact Sheet for Healthcare Providers: BankingDealers.co.za This test is not yet approved or cleared  by the [place] FDA and has been authorized for detection and/or diagnosis of SARS-CoV-2 by FDA under an Emergency Use Authorization (EUA).  This EUA will remain in effect (meaning this test can be used) for the duration of the COVID-19 declaration under Section 564(b)(1) of the Act, 21 U.S.C. section 360bbb-3(b)(1), unless the authorization is terminated or revoked sooner. Performed at [facility], [address] [place], [place] [address]      Labs: BNP (last 3 results) No results for input(s): BNP in the last 8760 hours. Basic Metabolic Panel: Recent Labs  Lab 12/26/19 2109 12/27/19 1253 12/28/19 0443  NA 136  --  137  K 4.8  --  4.9  CL 92*  --  97*  CO2 26  --  28  GLUCOSE 99  --  79  BUN 49*  --  25*  CREATININE 10.23*  --  6.49*  CALCIUM 7.7*  --  8.4*  PHOS  --   6.5*  --    Liver Function Tests: Recent Labs  Lab 12/27/19 0245  AST 25  ALT 15  ALKPHOS 163*  BILITOT 1.1  PROT 7.1  ALBUMIN 3.6   Recent Labs  Lab 12/26/19 2109  LIPASE 36   Recent Labs  Lab 12/27/19 1112  AMMONIA 39*   CBC: Recent Labs  Lab 12/27/19 1922 12/28/19 0443 12/29/19 0449 12/30/19 0438 12/31/19 0743  WBC 4.4 4.5 4.0 4.3 4.1  HGB 10.8* 10.9* 10.7* 11.1* 11.2*  HCT 34.0* 32.6* 33.2* 33.5* 34.4*  MCV 99.1 97.0 99.1 96.8 99.4  PLT 111* 111* 106* 106* 95*   Cardiac Enzymes: No results for input(s): CKTOTAL, CKMB, CKMBINDEX, TROPONINI in the last 168 hours. BNP: Invalid input(s): POCBNP CBG: Recent Labs  Lab 12/29/19 0851 12/30/19 0746 12/30/19 0822 12/31/19 0801 12/31/19 0912  GLUCAP 94 68* 113* 67* 99   D-Dimer No results for input(s): DDIMER in the last 72  hours. Hgb A1c No results for input(s): HGBA1C in the last 72 hours. Lipid Profile No results for input(s): CHOL, HDL, LDLCALC, TRIG, CHOLHDL, LDLDIRECT in the last 72 hours. Thyroid function studies No results for input(s): TSH, T4TOTAL, T3FREE, THYROIDAB in the last 72 hours.  Invalid input(s): FREET3 Anemia work up No results for input(s): VITAMINB12, FOLATE, FERRITIN, TIBC, IRON, RETICCTPCT in the last 72 hours. Urinalysis No results found for: COLORURINE, APPEARANCEUR, [place], [place], [place], [place], [place], [place], PROTEINUR, UROBILINOGEN, NITRITE, LEUKOCYTESUR Sepsis Labs Invalid input(s): PROCALCITONIN,  WBC,  LACTICIDVEN Microbiology Recent Results (from the past 240 hour(s))  SARS Coronavirus 2 by RT PCR (hospital order, performed in [facility] hospital lab) Nasopharyngeal Nasopharyngeal Swab     Status: None   Collection Time: 12/27/19  4:38 AM   Specimen: Nasopharyngeal Swab  Result Value Ref Range Status   SARS Coronavirus 2 NEGATIVE NEGATIVE Final    Comment: (NOTE) SARS-CoV-2 target nucleic acids are NOT DETECTED. The SARS-CoV-2 RNA is generally  detectable in upper and lower respiratory specimens during the acute phase of infection. The lowest concentration of SARS-CoV-2 viral copies this assay can detect is 250 copies / mL. A negative result does not preclude SARS-CoV-2 infection and should not be used as the sole basis for treatment or other patient management decisions.  A negative result may occur with improper specimen collection / handling, submission of specimen other than nasopharyngeal swab, presence of viral mutation(s) within the areas targeted by this assay, and inadequate number of viral copies (<250 copies / mL). A negative result must be combined with clinical observations, patient history, and epidemiological information. Fact Sheet for Patients:   StrictlyIdeas.no Fact Sheet for Healthcare Providers: BankingDealers.co.za This test is not yet approved or cleared  by the [place] FDA and has been authorized for detection and/or diagnosis of SARS-CoV-2 by FDA under an Emergency Use Authorization (EUA).  This EUA will remain in effect (meaning this test can be used) for the duration of the COVID-19 declaration under Section 564(b)(1) of the Act, 21 U.S.C. section 360bbb-3(b)(1), unless the authorization is terminated or revoked sooner. Performed at [facility], [address] [place],  [address]     Time coordinating discharge: Over 30 minutes  SIGNED:  Lorella Nimrod, MD  [facility] 12/31/2019, 12:38 PM  If 7PM-7AM, please contact night-coverage [email or web address]  This record has been created using Systems analyst. Errors have been sought and corrected,but may not always be located. Such creation errors do not reflect on the standard of care.

## 2019-12-31 NOTE — Progress Notes (Signed)
IV Hydralazine PRN given. BP recheck 161/72. MD Reesa Chew made aware. Per MD, OK for patient to be discharged.

## 2019-12-31 NOTE — Progress Notes (Signed)
Discharge instructions and medication details reviewed with patient. Patient verbalized understanding. Pt aware of medications that need to be picked up from pharmacy. IV removed. Printed AVS given to patient.  Pt escorted out by a volunteer.

## 2019-12-31 NOTE — Progress Notes (Signed)
ANTICOAGULATION CONSULT NOTE - Follow up [facility] for Heparin Indication: splenic infarct  Allergies  Allergen Reactions  . Betadine [Povidone Iodine] Rash   Patient Measurements: Height: 5\' 5"  (165.1 cm) Weight: 67.6 kg (149 lb 0.5 oz) IBW/kg (Calculated) : 61.5 HEPARIN DW (KG): 65  Vital Signs: Temp: 97.6 F (36.4 C) (05/29 0556) Temp Source: Oral (05/29 0556) BP: 165/81 (05/29 0556) Pulse Rate: 80 (05/29 0556)  Labs: Recent Labs    12/29/19 0449 12/29/19 0449 12/30/19 0438 12/30/19 0438 12/30/19 1248 12/30/19 1950 12/31/19 0743  HGB 10.7*   < > 11.1*  --   --   --  11.2*  HCT 33.2*  --  33.5*  --   --   --  34.4*  PLT 106*  --  106*  --   --   --  95*  HEPARINUNFRC 0.37   < > 0.25*   < > 0.39 0.43 0.36   < > = values in this interval not displayed.    Estimated Creatinine Clearance (by C-G formula based on SCr of 6.49 mg/dL (H)) Male: 9.4 mL/min (A) Male: 12 mL/min (A)   Medical History: Past Medical History:  Diagnosis Date  . Abdominal pain 08/28/2016  . Acute hyperkalemia 10/28/2016  . Chest pain 04/11/2016  . Chronic combined systolic and diastolic CHF (congestive heart failure) ([place])   . Cirrhosis ([place])   . Dialysis patient ([place])   . ESRD (end stage renal disease) on dialysis ([place]) 01/25/2015  . Fluid overload 04/12/2016  . HTN (hypertension) 10/13/2016  . Hyperkalemia 11/27/2016  . Hypertension   . Mitral regurgitation   . Pulmonary edema 07/03/2016  . Renal disorder   . Renal insufficiency   . Scrotal infection 01/25/2015    Assessment: Heparin initiated by EDP.  No anticoagulants per PTA med list.  Baseline labs ordered.  Pharmacy asked to follow and manage.  5/28 1248 HL 0.39 Heparin level is therapeutic x 1. Will continue current rate of heparin. 5/28 1950 HL= 0.43 therapeutic.Will continue current rate of 1200 units/hr.  Goal of Therapy:  Heparin level 0.3-0.7 units/ml Monitor platelets by anticoagulation protocol: Yes    Plan:  5/29 0743 HL= 0.36 therapeutic.Will continue current rate of 1200 units/hr. F/u HL with am labs.  CBC daily while on heparin.   Pharmacy will continue to follow   Noralee Space, PharmD, BCPS Clinical Pharmacist 12/31/2019 9:17 AM

## 2019-12-31 NOTE — Progress Notes (Signed)
[facility] Encounter Note  Patient: Mario [name] / Admit Date: 12/27/2019 / Date of Encounter: 12/31/2019, 5:40 AM   Subjective: Condition not specifically different overnight.  Patient with significant higher blood pressure requiring potential addition of added other medication management. Unable to negotiate transesophageal echocardiogram yesterday due to conflict and therefore would be able to perform later or possibly as an outpatient if necessary. No other significant symptoms at this time Echocardiogram showing normal LV systolic function with ejection fraction of 55 to 60% with myxomatous mitral valve possibly consistent with previous rheumatic fever and a possible source of embolism either from the valve and or the possibility of atrial fibrillation Review of Systems: Positive for: None Negative for: Vision change, hearing change, syncope, dizziness, nausea, vomiting,diarrhea, bloody stool, stomach pain, cough, congestion, diaphoresis, urinary frequency, urinary pain,skin lesions, skin rashes Others previously listed  Objective: Telemetry: Normal sinus rhythm Physical Exam: Blood pressure (!) 172/84, pulse 67, temperature 98.6 F (37 C), temperature source Oral, resp. rate 15, height 5\' 5"  (1.651 m), weight 67.3 kg, SpO2 100 %. Body mass index is 24.69 kg/m. General: Well developed, well nourished, in no acute distress. Head: Normocephalic, atraumatic, sclera non-icteric, no xanthomas, nares are without discharge. Neck: No apparent masses Lungs: Normal respirations with no wheezes, no rhonchi, no rales , no crackles   Heart: Regular rate and rhythm, normal S1 S2, no murmur, no rub, no gallop, PMI is normal size and placement, carotid upstroke normal without bruit, jugular venous pressure normal Abdomen: Soft, non-tender, non-distended with normoactive bowel sounds. No hepatosplenomegaly. Abdominal aorta is normal size without bruit Extremities: No  edema, no clubbing, no cyanosis, no ulcers,  Peripheral: 2+ radial, 2+ femoral, 2+ dorsal pedal pulses Neuro: Alert and oriented. Moves all extremities spontaneously. Psych:  Responds to questions appropriately with a normal affect.   Intake/Output Summary (Last 24 hours) at 12/31/2019 0540 Last data filed at 12/31/2019 0257 Gross per 24 hour  Intake 507.22 ml  Output 0 ml  Net 507.22 ml    Inpatient Medications:  . Chlorhexidine Gluconate Cloth  6 each Topical Q0600  . ferric citrate  420 mg Oral TID WC  . irbesartan  300 mg Oral QHS  . nicotine  21 mg Transdermal Daily   Infusions:  . heparin 1,200 Units/hr (12/31/19 0409)    Labs: No results for input(s): NA, K, CL, CO2, GLUCOSE, BUN, CREATININE, CALCIUM, MG, PHOS in the last 72 hours. No results for input(s): AST, ALT, ALKPHOS, BILITOT, PROT, ALBUMIN in the last 72 hours. Recent Labs    12/29/19 0449 12/30/19 0438  WBC 4.0 4.3  HGB 10.7* 11.1*  HCT 33.2* 33.5*  MCV 99.1 96.8  PLT 106* 106*   No results for input(s): CKTOTAL, CKMB, TROPONINI in the last 72 hours. Invalid input(s): POCBNP No results for input(s): HGBA1C in the last 72 hours.   Weights: Filed Weights   12/26/19 2107 12/27/19 1230 12/30/19 0500  Weight: 65 kg 65 kg 67.3 kg     Radiology/Studies:  DG Chest 2 View  Result Date: 12/27/2019 CLINICAL DATA:  Left upper quadrant pain. Vomiting. EXAM: CHEST - 2 VIEW COMPARISON:  Chest radiograph 03/24/2019 FINDINGS: Unchanged heart size and mediastinal contours. Peribronchial thickening. Suspected small left pleural effusion. Subsegmental atelectasis at the bases. No pneumothorax. Surgical hardware in the cervical spine is partially included. IMPRESSION: 1. Mild cardiomegaly. Peribronchial thickening may be bronchitic or congestive. 2. Suspected small left pleural effusion. Electronically Signed   By: Keith Rake  M.D.   On: 12/27/2019 02:09   CT Abdomen Pelvis W Contrast  Addendum Date: 12/27/2019    ADDENDUM REPORT: 12/27/2019 03:58 ADDENDUM: These results were called by telephone on 12/27/2019 at 3:58 am to provider JADE SUNG , who verbally acknowledged these results. Electronically Signed   By: Lovena Le M.D.   On: 12/27/2019 03:58   Result Date: 12/27/2019 CLINICAL DATA:  Nausea vomiting, left-sided abdominal pain, recent dialysis 2 days prior EXAM: CT ABDOMEN AND PELVIS WITH CONTRAST TECHNIQUE: Multidetector CT imaging of the abdomen and pelvis was performed using the standard protocol following bolus administration of intravenous contrast. CONTRAST:  149mL OMNIPAQUE IOHEXOL 300 MG/ML  SOLN COMPARISON:  CT 05/01/2018 FINDINGS: Lower chest: Dependent atelectatic changes noted in the lung bases. Chronic left basilar thickening calcified pleural plaque with adjacent rounded atelectasis in the left lower lobe, similar to comparison exam. No new consolidative opacity. Mild cardiomegaly. Dense calcification of the mitral annulus and upon the chordae tendinae. Apical thinning of the left ventricle with calcification may reflect sequela of prior infarct. Coronary artery calcifications are present. No pericardial effusion. Hepatobiliary: There is a diffusely did dysplastic appearance throughout the liver with a markedly nodular liver surface contour, similar to comparison exams. This likely reflect sequela of chronic fibrotic changes of cirrhosis. No focal liver lesion. Mild gallbladder wall thickening is nonspecific in the setting of intrinsic liver disease. No visible calcified gallstones or biliary ductal dilatation. Pancreas: Unremarkable. No pancreatic ductal dilatation or surrounding inflammatory changes. Spleen: Wedge-shaped regions of hypoattenuation in the central spleen and anterior spleen (2/39 for example) have an appearance suggestive of splenic infarct. Splenomegaly is noted. Adrenals/Urinary Tract: Normal adrenal glands. Diffusely atrophic and dysplastic appearance of the kidneys with multiple  small cysts of mixed attenuation many of which are unchanged from comparison in 2019. Findings are compatible with chronic dialysis dependence. No hydronephrosis or urolithiasis. No new concerning renal lesion. Urinary bladder is largely decompressed at the time of exam and therefore poorly evaluated by CT imaging. Diffuse bladder wall thickening is noted. A calcification seen along the and anterior bladder wall is unchanged prior. Stomach/Bowel: Extensive paraesophageal varices. Gastric varices are more mild. Diffuse mild edematous thickening of the small bowel likely related to the moderate volume ascites and or portal enteropathy. A normal appendix is visualized. No colonic dilatation or wall thickening. Moderate stool burden. Minimal rectal wall thickening with extensive perirectal varices as well. Vascular/Lymphatic: Extensive aortoiliac atherosclerosis. No aneurysm or ectasia. The hepatic veins are poorly opacified. Dense calcification in the hepatic veins may suggest some chronic thrombosis. There is recanalization of the umbilical vein as well as caput medusa across the anterior abdomen. There are extensive upper abdominal venous collaterals including esophageal and gastric varices, as above. Evidence of splenorenal shunting is noted. The portal vein and splenic veins are patent. Question a portal venous stent. There is notable hypodense expansile filling defect extending from the left common femoral vein and greater saphenous vein into the iliac vein and IVC which extends to the level of the confluence with the renal veins. Reproductive: Prostate is unremarkable. Few adjacent varices of the right gonadal vein. Other: Moderate volume ascites. No bowel containing hernia. No abdominopelvic free air, pneumatosis or portal venous gas. Musculoskeletal: Chronic osseous manifestations of hyperparathyroidism/renal osteodystrophy. Prior changes of bilateral sacroiliitis which are progressive from prior studies.  Additional erosive changes along the left symphysis pubis are similar to the comparison. No new concerning osseous lesions. IMPRESSION: 1. Wedge-shaped regions of hypoattenuation in the central spleen  and anterior spleen have an appearance suggestive of splenic infarct. 2. Stigmata of cirrhosis and portal hypertension with a markedly nodular and dysplastic appearing liver, extensive venous collateralization through the abdomen, splenomegaly, and moderate volume ascites. 3. Venous collaterals including a recanalized umbilical vein, gastric and esophageal varices, and rectal varices. 4. Calcification in the hepatic vein could reflect sequela of remote or chronic thrombosis, poorly opacified on this exam. 5. There is hypodense filling defect extending from the left lower extremity from the femoral and greater saphenous veins into the common femoral, iliac and IVC which extends to the confluence of the renal arteries. 6. Diffuse mild edematous thickening of the small bowel likely related to the moderate volume ascites and/or portal enteropathy. 7. Diffuse bladder wall thickening, possibly related to intrinsic liver disease. Recommend correlation with urinalysis to exclude cystitis. 8. Chronic osseous manifestations of hyperparathyroidism/renal osteodystrophy. 9. Prior changes of bilateral sacroiliitis which are progressive from prior studies. Additional erosive changes along the left symphysis pubis are similar to the comparison. 10. Chronic left basilar thickening calcified pleural plaque with adjacent rounded atelectasis in the left lower lobe, similar to comparison in 2019. 11. Aortic Atherosclerosis (ICD10-I70.0). Currently attempting to contact the ordering provider with a critical value result. Addendum will be submitted upon case discussion. Electronically Signed: By: Lovena Le M.D. On: 12/27/2019 03:53   ECHOCARDIOGRAM COMPLETE  Result Date: 12/29/2019    ECHOCARDIOGRAM REPORT   Patient Name:   Emeterio AMADO  Dessie Coma Date of Exam: 12/29/2019 Medical Rec #:  [id number]                Height:       65.0 in Accession #:    [id number]               Weight:       143.3 lb Date of Birth:  1971/01/03               BSA:          1.717 m Patient Age:    49 years                 BP:           183/75 mmHg Patient Gender: M                        HR:           68 bpm. Exam Location:  [facility] Procedure: 2D Echo, Color Doppler and Cardiac Doppler Indications:     R06.00 Dyspnea  History:         Patient has prior history of Echocardiogram examinations. CHF,                  ESRD; Risk Factors:Hypertension.  Sonographer:     Charmayne Sheer RDCS (AE) Referring Phys:  [id number] [facility] AMIN Diagnosing Phys: Serafina Royals MD IMPRESSIONS  1. Left ventricular ejection fraction, by estimation, is 55 to 60%. The left ventricle has normal function. The left ventricle has no regional wall motion abnormalities. Left ventricular diastolic parameters were normal.  2. Right ventricular systolic function is normal. The right ventricular size is normal.  3. Left atrial size was mildly dilated.  4. The mitral valve is myxomatous. Trivial mitral valve regurgitation. Mild mitral stenosis.  5. The aortic valve is normal in structure. Aortic valve regurgitation is trivial. FINDINGS  Left Ventricle: Left ventricular ejection fraction, by estimation, is 55 to 60%. The left ventricle  has normal function. The left ventricle has no regional wall motion abnormalities. The left ventricular internal cavity size was normal in size. There is  no left ventricular hypertrophy. Left ventricular diastolic parameters were normal. Right Ventricle: The right ventricular size is normal. No increase in right ventricular wall thickness. Right ventricular systolic function is normal. Left Atrium: Left atrial size was mildly dilated. Right Atrium: Right atrial size was normal in size. Pericardium: There is no evidence of pericardial effusion. Mitral Valve: The mitral valve is  myxomatous. Trivial mitral valve regurgitation. Mild mitral valve stenosis. MV peak gradient, 18.8 mmHg. The mean mitral valve gradient is 9.0 mmHg. Tricuspid Valve: The tricuspid valve is normal in structure. Tricuspid valve regurgitation is trivial. Aortic Valve: The aortic valve is normal in structure. Aortic valve regurgitation is trivial. Aortic valve mean gradient measures 15.0 mmHg. Aortic valve peak gradient measures 33.0 mmHg. Aortic valve area, by VTI measures 1.94 cm. Pulmonic Valve: The pulmonic valve was normal in structure. Pulmonic valve regurgitation is not visualized. Aorta: The aortic root and ascending aorta are structurally normal, with no evidence of dilitation. IAS/Shunts: No atrial level shunt detected by color flow Doppler.  LEFT VENTRICLE PLAX 2D LVIDd:         5.87 cm  Diastology LVIDs:         3.31 cm  LV e' lateral:   9.14 cm/s LV PW:         1.06 cm  LV E/e' lateral: 19.8 LV IVS:        0.69 cm  LV e' medial:    5.98 cm/s LVOT diam:     2.00 cm  LV E/e' medial:  30.3 LV SV:         103 LV SV Index:   60 LVOT Area:     3.14 cm  RIGHT VENTRICLE RV Basal diam:  3.25 cm LEFT ATRIUM           Index       RIGHT ATRIUM           Index LA diam:      4.10 cm 2.39 cm/m  RA Area:     17.10 cm LA Vol (A2C): 48.9 ml 28.48 ml/m RA Volume:   46.90 ml  27.32 ml/m LA Vol (A4C): 42.3 ml 24.64 ml/m  AORTIC VALVE                    PULMONIC VALVE AV Area (Vmax):    1.85 cm     PV Vmax:       2.11 m/s AV Area (Vmean):   1.80 cm     PV Vmean:      119.000 cm/s AV Area (VTI):     1.94 cm     PV VTI:        0.380 m AV Vmax:           287.33 cm/s  PV Peak grad:  17.8 mmHg AV Vmean:          176.333 cm/s PV Mean grad:  7.0 mmHg AV VTI:            0.534 m AV Peak Grad:      33.0 mmHg AV Mean Grad:      15.0 mmHg LVOT Vmax:         169.00 cm/s LVOT Vmean:        101.000 cm/s LVOT VTI:          0.329 m LVOT/AV VTI  ratio: 0.62  AORTA Ao Root diam: 3.00 cm MITRAL VALVE MV Area (PHT): 2.72 cm     SHUNTS MV  Peak grad:  18.8 mmHg    Systemic VTI:  0.33 m MV Mean grad:  9.0 mmHg     Systemic Diam: 2.00 cm MV Vmax:       2.17 m/s MV Vmean:      143.0 cm/s MV Decel Time: 279 msec MV E velocity: 181.00 cm/s MV A velocity: 138.00 cm/s MV E/A ratio:  1.31 Serafina Royals MD Electronically signed by Serafina Royals MD Signature Date/Time: 12/29/2019/5:31:52 PM    Final    CT Angio Chest/Abd/Pel for Dissection W and/or W/WO  Result Date: 12/28/2019 CLINICAL DATA:  Left-sided abdominal pain. EXAM: CT ANGIOGRAPHY CHEST, ABDOMEN AND PELVIS TECHNIQUE: Non-contrast CT of the chest was initially obtained. Multidetector CT imaging through the chest, abdomen and pelvis was performed using the standard protocol during bolus administration of intravenous contrast. Multiplanar reconstructed images and MIPs were obtained and reviewed to evaluate the vascular anatomy. CONTRAST:  158mL OMNIPAQUE IOHEXOL 350 MG/ML SOLN COMPARISON:  May 01, 2018. FINDINGS: CTA CHEST FINDINGS Cardiovascular: Preferential opacification of the thoracic aorta. No evidence of thoracic aortic aneurysm or dissection. Normal heart size. No pericardial effusion. Large paraesophageal varices are noted with large collateral veins in the posterior mediastinum extending to the azygos vein. Mediastinum/Nodes: No enlarged mediastinal, hilar, or axillary lymph nodes. Thyroid gland, trachea, and esophagus demonstrate no significant findings. Lungs/Pleura: No pneumothorax is noted. Right lung is clear. Mild left posterior basilar subsegmental atelectasis is noted with possible calcified pleural plaque seen posteriorly in the left lung base. Musculoskeletal: Findings consistent with renal osteodystrophy. Probable nondisplaced fracture seen involving lateral portion of left ninth rib. Review of the MIP images confirms the above findings. CTA ABDOMEN AND PELVIS FINDINGS VASCULAR Aorta: Atherosclerosis of thoracic aorta is noted without aneurysm or dissection. Celiac: Patent  without evidence of aneurysm, dissection, vasculitis or significant stenosis. SMA: Patent without evidence of aneurysm, dissection, vasculitis or significant stenosis. Renals: Renal arteries bilaterally are severely diminished in caliber consistent with end-stage renal disease. IMA: Patent without evidence of aneurysm, dissection, vasculitis or significant stenosis. Inflow: Patent without evidence of aneurysm, dissection, vasculitis or significant stenosis. Veins: No obvious venous abnormality within the limitations of this arterial phase study. Review of the MIP images confirms the above findings. NON-VASCULAR Hepatobiliary: Hepatic cirrhosis is again noted. No biliary dilatation is noted. No cholelithiasis is noted. Pancreas: Unremarkable. No pancreatic ductal dilatation or surrounding inflammatory changes. Spleen: Stable peripheral low density is noted in the spleen which may represent infarction of indeterminate age. Adrenals/Urinary Tract: Adrenal glands appear normal. Severely atrophic kidneys are noted bilaterally consistent with end-stage renal disease. No hydronephrosis or renal obstruction is noted. Urinary bladder is decompressed. Stomach/Bowel: Stomach is within normal limits. Appendix appears normal. No evidence of bowel wall thickening, distention, or inflammatory changes. Enlarged perirectal varices are noted which drain into enlarged left gonadal vein; some of these varices appear to extend into the lumen of the rectum. Lymphatic: No adenopathy is noted. Reproductive: Prostate is unremarkable. Other: Moderate ascites is noted. No hernia is noted. Musculoskeletal: Findings consistent with renal osteodystrophy. Degenerative changes are seen involving the pubic symphysis. No acute abnormality is noted. Review of the MIP images confirms the above findings. IMPRESSION: 1. Atherosclerosis of thoracic and abdominal aorta is noted without aneurysm or dissection. 2. Enlarged perirectal varices are noted which  drain into large left gonadal vein; some of these varices appear to  extend into the lumen of the rectum. 3. Large paraesophageal varices are noted with large collateral veins in the posterior mediastinum extending to the azygos vein. 4. Hepatic cirrhosis is again noted. 5. Stable peripheral low density is noted in the spleen which may represent infarction of indeterminate age. 6. Severely atrophic kidneys are noted bilaterally consistent with end-stage renal disease. 7. Probable nondisplaced fracture seen involving lateral portion of left ninth rib. 8. Moderate ascites is noted. Electronically Signed   By: Marijo Conception M.D.   On: 12/28/2019 10:02     Assessment and Recommendation  49 y.o. adult with chronic kidney disease having a splenic infarct of unknown etiology without evidence of current rhythm disturbances or primary source seen at this time 1.  Continue hypertension control with irbesartan and would consider calcium channel blocker amlodipine 5 mg for better hypertension control with a goal systolic blood pressure below 130 mm 2.  Continue dialysis treatment without restriction 3.  Initiate anticoagulation from heparin to Eliquis 5 mg twice per day for further risk reduction of embolic phenomenon either from atrial fibrillation or myxomatous mitral valve until further specific evaluation as outpatient can be performed including further longer term evaluation by monitor to assess for atrial fibrillation and transesophageal echocardiogram to assess for thrombosis and or valvular cardiac source 4.  Further treatment options after above  Signed, Serafina Royals M.D. FACC

## 2020-03-03 ENCOUNTER — Encounter: Payer: Self-pay | Admitting: Emergency Medicine

## 2020-03-03 ENCOUNTER — Other Ambulatory Visit: Payer: Self-pay

## 2020-03-03 ENCOUNTER — Emergency Department: Payer: Self-pay

## 2020-03-03 ENCOUNTER — Emergency Department
Admission: EM | Admit: 2020-03-03 | Discharge: 2020-03-03 | Disposition: A | Discharge status: Home / Self Care | Payer: Self-pay | Attending: Emergency Medicine | Admitting: Emergency Medicine

## 2020-03-03 DIAGNOSIS — I5032 Chronic diastolic (congestive) heart failure: Secondary | ICD-10-CM | POA: Insufficient documentation

## 2020-03-03 DIAGNOSIS — I10 Essential (primary) hypertension: Secondary | ICD-10-CM | POA: Insufficient documentation

## 2020-03-03 DIAGNOSIS — I110 Hypertensive heart disease with heart failure: Secondary | ICD-10-CM | POA: Insufficient documentation

## 2020-03-03 DIAGNOSIS — R220 Localized swelling, mass and lump, head: Secondary | ICD-10-CM | POA: Insufficient documentation

## 2020-03-03 DIAGNOSIS — R21 Rash and other nonspecific skin eruption: Secondary | ICD-10-CM | POA: Insufficient documentation

## 2020-03-03 DIAGNOSIS — F17210 Nicotine dependence, cigarettes, uncomplicated: Secondary | ICD-10-CM | POA: Insufficient documentation

## 2020-03-03 DIAGNOSIS — Z992 Dependence on renal dialysis: Secondary | ICD-10-CM | POA: Insufficient documentation

## 2020-03-03 LAB — COMPREHENSIVE METABOLIC PANEL
ALT: 18 U/L (ref 0–44)
AST: 30 U/L (ref 15–41)
Albumin: 3.9 g/dL (ref 3.5–5.0)
Alkaline Phosphatase: 156 U/L — ABNORMAL HIGH (ref 38–126)
Anion gap: 19 — ABNORMAL HIGH (ref 5–15)
BUN: 34 mg/dL — ABNORMAL HIGH (ref 6–20)
CO2: 27 mmol/L (ref 22–32)
Calcium: 7.8 mg/dL — ABNORMAL LOW (ref 8.9–10.3)
Chloride: 90 mmol/L — ABNORMAL LOW (ref 98–111)
Creatinine, Ser: 8.1 mg/dL — ABNORMAL HIGH (ref 0.61–1.24)
GFR calc Af Amer: 8 mL/min — ABNORMAL LOW (ref >60)
GFR calc non Af Amer: 7 mL/min — ABNORMAL LOW (ref >60)
Glucose, Bld: 152 mg/dL — ABNORMAL HIGH (ref 70–99)
Potassium: 4.4 mmol/L (ref 3.5–5.1)
Sodium: 136 mmol/L (ref 135–145)
Total Bilirubin: 1.5 mg/dL — ABNORMAL HIGH (ref 0.3–1.2)
Total Protein: 7.4 g/dL (ref 6.5–8.1)

## 2020-03-03 LAB — CBC WITH DIFFERENTIAL/PLATELET
Abs Immature Granulocytes: 0.02 10*3/uL (ref 0.00–0.07)
Basophils Absolute: 0.1 10*3/uL (ref 0.0–0.1)
Basophils Relative: 1 %
Eosinophils Absolute: 0.1 10*3/uL (ref 0.0–0.5)
Eosinophils Relative: 2 %
HCT: 36.1 % — ABNORMAL LOW (ref 39.0–52.0)
Hemoglobin: 11.8 g/dL — ABNORMAL LOW (ref 13.0–17.0)
Immature Granulocytes: 0 %
Lymphocytes Relative: 14 %
Lymphs Abs: 0.7 10*3/uL (ref 0.7–4.0)
MCH: 33.7 pg (ref 26.0–34.0)
MCHC: 32.7 g/dL (ref 30.0–36.0)
MCV: 103.1 fL — ABNORMAL HIGH (ref 80.0–100.0)
Monocytes Absolute: 0.3 10*3/uL (ref 0.1–1.0)
Monocytes Relative: 7 %
Neutro Abs: 3.8 10*3/uL (ref 1.7–7.7)
Neutrophils Relative %: 76 %
Platelets: 123 10*3/uL — ABNORMAL LOW (ref 150–400)
RBC: 3.5 MIL/uL — ABNORMAL LOW (ref 4.22–5.81)
RDW: 14.1 % (ref 11.5–15.5)
WBC: 4.9 10*3/uL (ref 4.0–10.5)
nRBC: 0 % (ref 0.0–0.2)

## 2020-03-03 MED ORDER — LIDOCAINE-EPINEPHRINE (PF) 1 %-1:200000 IJ SOLN: 10.0000 mL | Freq: Once | INTRAMUSCULAR | Status: DC

## 2020-03-03 MED ORDER — OXYCODONE HCL 5 MG PO TABS
5.0000 mg | ORAL_TABLET | Freq: Once | ORAL | Status: AC
  Administered 2020-03-03: 5 mg via ORAL
  Filled 2020-03-03: qty 1

## 2020-03-03 MED ORDER — DOXYCYCLINE HYCLATE 100 MG PO TABS
100.0000 mg | ORAL_TABLET | Freq: Once | ORAL | Status: AC
  Administered 2020-03-03: 100 mg via ORAL
  Filled 2020-03-03: qty 1

## 2020-03-03 MED ORDER — ONDANSETRON 4 MG PO TBDP
4.0000 mg | ORAL_TABLET | Freq: Once | ORAL | Status: AC
  Administered 2020-03-03: 4 mg via ORAL
  Filled 2020-03-03: qty 1

## 2020-03-03 MED ORDER — OXYCODONE-ACETAMINOPHEN 5-325 MG PO TABS: 1.0000 | ORAL_TABLET | Freq: Four times a day (QID) | ORAL | 0 refills | Status: DC | PRN

## 2020-03-03 MED ORDER — LIDOCAINE-EPINEPHRINE 2 %-1:100000 IJ SOLN
20.0000 mL | Freq: Once | INTRAMUSCULAR | Status: AC
  Administered 2020-03-03: 20 mL via INTRADERMAL

## 2020-03-03 MED ORDER — ACETAMINOPHEN 500 MG PO TABS
1000.0000 mg | ORAL_TABLET | Freq: Once | ORAL | Status: AC
  Administered 2020-03-03: 1000 mg via ORAL
  Filled 2020-03-03: qty 2

## 2020-03-03 MED ORDER — DOXYCYCLINE HYCLATE 100 MG PO CAPS
100.0000 mg | ORAL_CAPSULE | Freq: Two times a day (BID) | ORAL | 0 refills | Status: AC
Start: 2020-03-03 — End: 2020-03-10

## 2020-03-03 MED ORDER — OXYCODONE-ACETAMINOPHEN 5-325 MG PO TABS
2.0000 | ORAL_TABLET | Freq: Once | ORAL | Status: AC
  Administered 2020-03-03: 2 via ORAL
  Filled 2020-03-03: qty 2

## 2020-03-03 NOTE — ED Notes (Signed)
Pt standing in doorway and swearing in spanish

## 2020-03-03 NOTE — ED Triage Notes (Signed)
Rafael, Interpreter in triage at this time. Pt states is a T/TH/S dialysis patient. Pt presents with mass to L temporal area x 6 weeks. Pt states intermittent fevers only at night.   Pt with dialysis fistula to L upper arm, states last dialysis on Thursday.   Pt states pain 10/10 that is throbbing in nature.

## 2020-03-03 NOTE — ED Provider Notes (Signed)
[facility] Emergency Department Provider Note  ____________________________________________   First MD Initiated Contact with Patient 03/03/20 1549     (approximate)  I have reviewed the triage vital signs and the nursing notes.   HISTORY  Chief Complaint Mass    HPI Mario [name] is a 48 y.o. adult here with facial mass.  The patient arrives with chief complaint of worsening left temporal facial mass.  He states it began approximately 6 to 7 weeks ago as a flesh-colored wound.  It was nonpainful to time.  Over the last 6 weeks, it has gotten progressively larger and more painful.  Is been trying to drain it with occasional drainage, mostly bloody but occasionally purulent, and associated pain.  He has not seen anyone for this.  He denies any fever.  Denies any surrounding redness.  He states he is here because he wants it taken off.  He has not seen a primary doctor for this.  No known history of facial cancer or other masses or lesions.  No other acute complaints.  History obtained with Spanish interpreter.        Past Medical History:  Diagnosis Date  . Abdominal pain 08/28/2016  . Acute hyperkalemia 10/28/2016  . Chest pain 04/11/2016  . Chronic combined systolic and diastolic CHF (congestive heart failure) ([place])   . Cirrhosis ([place])   . Dialysis patient ([place])   . ESRD (end stage renal disease) on dialysis ([place]) 01/25/2015  . Fluid overload 04/12/2016  . HTN (hypertension) 10/13/2016  . Hyperkalemia 11/27/2016  . Hypertension   . Mitral regurgitation   . Pulmonary edema 07/03/2016  . Renal disorder   . Renal insufficiency   . Scrotal infection 01/25/2015    Patient Active Problem List   Diagnosis Date Noted  . 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  . Tobacco abuse 04/16/2019  . Anemia in ESRD (end-stage renal disease) ([place]) 04/16/2019  . Neck pain, bilateral posterior 04/16/2019  . Cervical spinal stenosis 04/13/2019  . Cervical vertebral  fracture ([place]) 04/13/2019  . Neck pain 04/03/2019  . Cervical radiculopathy 04/03/2019  . Hypertensive urgency 04/01/2019  . Intractable abdominal pain 05/01/2018  . Gastroenteritis 10/02/2017  . Abdominal pain, acute 08/21/2017  . Malnutrition of moderate degree 08/21/2017  . SBP (spontaneous bacterial peritonitis) ([place]) 06/09/2017  . Erroneous encounter - disregard 03/04/2017  . Hyperkalemia 11/27/2016  . Acute hyperkalemia 10/28/2016  . Chronic diastolic (congestive) heart failure ([place]) 10/13/2016  . Cirrhosis ([place]) 10/13/2016  . HTN (hypertension) 10/13/2016  . Abdominal pain 08/28/2016  . Pulmonary edema 07/03/2016  . Fluid overload 04/12/2016  . Chest pain 04/11/2016  . Scrotal infection 01/25/2015  . ESRD (end stage renal disease) on dialysis ([place]) 01/25/2015    Past Surgical History:  Procedure Laterality Date  . ANTERIOR CERVICAL CORPECTOMY N/A 04/05/2019   Procedure: ANTERIOR CERVICAL CORPECTOMY Cervical six and Cervical seven with strut graft fusion from Cervical five-Thoracic one   ;  Surgeon: Earnie Larsson, MD;  Location: [place];  Service: Neurosurgery;  Laterality: N/A;  . AV FISTULA PLACEMENT    . POSTERIOR CERVICAL FUSION/FORAMINOTOMY N/A 04/05/2019   Procedure: Posterior cervical fusion utilizing segmental instrumentation from Cervical five-Thoracic one ;  Surgeon: Earnie Larsson, MD;  Location: [place];  Service: Neurosurgery;  Laterality: N/A;    Prior to Admission medications   Medication Sig Start Date End Date Taking? Authorizing Provider  acetaminophen (TYLENOL) 500 MG tablet Take 500 mg by mouth every 6 (six) hours as needed for  mild pain.    [provider]  amLODipine (NORVASC) 10 MG tablet Take 1 tablet (10 mg total) by mouth daily. Patient not taking: Reported on 12/27/2019 04/14/19 05/14/19  Darliss Cheney, MD  apixaban (ELIQUIS) 5 MG TABS tablet Take 1 tablet (5 mg total) by mouth 2 (two) times daily. 12/31/19   Lorella Nimrod, MD  doxycycline (VIBRAMYCIN)  100 MG capsule Take 1 capsule (100 mg total) by mouth 2 (two) times daily for 7 days. 03/03/20 03/10/20  Duffy Bruce, MD  ferric citrate (AURYXIA) 1 GM 210 MG(Fe) tablet Take 420 mg by mouth 3 (three) times daily with meals.     [provider]  irbesartan (AVAPRO) 300 MG tablet Take 1 tablet (300 mg total) by mouth at bedtime. 12/31/19   Lorella Nimrod, MD  lidocaine (LIDODERM) 5 % Place 1 patch onto the skin daily. Remove & Discard patch within 12 hours or as directed by MD 12/31/19   Lorella Nimrod, MD  oxyCODONE (OXY IR/ROXICODONE) 5 MG immediate release tablet Take 1 tablet (5 mg total) by mouth every 6 (six) hours as needed for up to 15 doses for moderate pain or severe pain. 12/31/19   Lorella Nimrod, MD  oxyCODONE-acetaminophen (PERCOCET) 5-325 MG tablet Take 1-2 tablets by mouth every 6 (six) hours as needed for moderate pain or severe pain. 03/03/20 03/03/21  Duffy Bruce, MD    Allergies Betadine [povidone iodine]  Family History  Problem Relation Age of Onset  . Kidney failure Father   . Diabetes Mother     Social History Social History   Tobacco Use  . Smoking status: Current Every Day Smoker    Packs/day: 0.25    Years: 25.00    Pack years: 6.25    Types: Cigarettes  . Smokeless tobacco: Never Used  Substance Use Topics  . Alcohol use: No    Comment: pt denies  . Drug use: No    Review of Systems  Review of Systems  Constitutional: Negative for chills, fatigue and fever.  HENT: Positive for facial swelling. Negative for congestion and sore throat.   Eyes: Negative for visual disturbance.  Respiratory: Negative for cough and shortness of breath.   Cardiovascular: Negative for chest pain and leg swelling.  Gastrointestinal: Negative for abdominal pain, diarrhea, nausea and vomiting.  Genitourinary: Negative for dysuria and flank pain.  Musculoskeletal: Negative for neck pain and neck stiffness.  Skin: Positive for rash and wound.  Neurological: Negative  for weakness and headaches.  All other systems reviewed and are negative.    ____________________________________________  PHYSICAL EXAM:      VITAL SIGNS: ED Triage Vitals [03/03/20 0831]  Enc Vitals Group     BP (!) 166/76     Pulse Rate 68     Resp 20     Temp 98.4 F (36.9 C)     Temp Source Oral     SpO2 100 %     Weight 149 lb 0.5 oz (67.6 kg)     Height 5\' 5"  (1.651 m)     Head Circumference      Peak Flow      Pain Score 10     Pain Loc      Pain Edu?      Excl. in [place]?      Physical Exam Vitals and nursing note reviewed.  Constitutional:      General: He is not in acute distress.    Appearance: He is well-developed.  HENT:  Head: Normocephalic and atraumatic.     Comments: Ovoid, well-circumscribed approximately 4 x 4 cm, raised mass along the left temple area.  There is a small amount of erythema and fluctuance along the superior aspect.  The remainder appears hyperkeratotic.  Moderate tenderness.  No surrounding swelling or redness.  No periorbital extension. Eyes:     Conjunctiva/sclera: Conjunctivae normal.  Cardiovascular:     Rate and Rhythm: Normal rate and regular rhythm.     Heart sounds: Normal heart sounds.  Pulmonary:     Effort: Pulmonary effort is normal. No respiratory distress.     Breath sounds: No wheezing.  Abdominal:     General: There is no distension.  Musculoskeletal:     Cervical back: Neck supple.  Skin:    General: Skin is warm.     Capillary Refill: Capillary refill takes less than 2 seconds.     Findings: No rash.  Neurological:     Mental Status: He is alert and oriented to person, place, and time.     Motor: No abnormal muscle tone.       ____________________________________________   LABS (all labs ordered are listed, but only abnormal results are displayed)  Labs Reviewed  CBC WITH DIFFERENTIAL/PLATELET - Abnormal; Notable for the following components:      Result Value   RBC 3.50 (*)    Hemoglobin 11.8  (*)    HCT 36.1 (*)    MCV 103.1 (*)    Platelets 123 (*)    All other components within normal limits  COMPREHENSIVE METABOLIC PANEL - Abnormal; Notable for the following components:   Chloride 90 (*)    Glucose, Bld 152 (*)    BUN 34 (*)    Creatinine, Ser 8.10 (*)    Calcium 7.8 (*)    Alkaline Phosphatase 156 (*)    Total Bilirubin 1.5 (*)    GFR calc non Af Amer 7 (*)    GFR calc Af Amer 8 (*)    Anion gap 19 (*)    All other components within normal limits  AEROBIC CULTURE (SUPERFICIAL SPECIMEN)    ____________________________________________  EKG: None ________________________________________  RADIOLOGY All imaging, including plain films, CT scans, and ultrasounds, independently reviewed by me, and interpretations confirmed via formal radiology reads.  ED MD interpretation:   CT Head: Left temporal soft tissue mass, no other lesions  Official radiology report(s): CT Head Wo Contrast  Result Date: 03/03/2020 CLINICAL DATA:  Headache. Patient also reports a mass to the left temporal region for 6 weeks. EXAM: CT HEAD WITHOUT CONTRAST TECHNIQUE: Contiguous axial images were obtained from the base of the skull through the vertex without intravenous contrast. COMPARISON:  10/01/2017 FINDINGS: Brain: No evidence of acute infarction, hemorrhage, hydrocephalus, extra-axial collection or mass lesion/mass effect. Vascular: No hyperdense vessel or unexpected calcification. Skull: Normal. Negative for fracture or focal lesion. Sinuses/Orbits: Visualized globes and orbits are unremarkable. The visualized sinuses and mastoid air cells are clear. Other: There is a left temporal superficial soft tissue mass, which appears to arise from the skin, measuring 2.4 cm anterior to posterior and 7 mm in thickness. It has relatively high attenuation with mean Hounsfield units of 53. IMPRESSION: 1. No intracranial abnormality. 2. Left temporal region superficial soft tissue mass, that appears to arise  from the skin, 2.4 x 0.7 cm transversely. Electronically Signed   By: Lajean Manes M.D.   On: 03/03/2020 09:59    ____________________________________________  PROCEDURES   Procedure(s) performed (including Critical  Care):  Marland KitchenMarland KitchenIncision and Drainage  Date/Time: 03/03/2020 6:15 PM Performed by: Duffy Bruce, MD Authorized by: Duffy Bruce, MD   Consent:    Consent obtained:  Verbal   Consent given by:  Patient   Risks discussed:  Bleeding, damage to other organs, incomplete drainage, infection and pain   Alternatives discussed:  Alternative treatment and delayed treatment Location:    Type:  Abscess Pre-procedure details:    Skin preparation:  Betadine Anesthesia (see MAR for exact dosages):    Anesthesia method:  Local infiltration   Local anesthetic:  Lidocaine 1% WITH epi Procedure type:    Complexity:  Simple Procedure details:    Incision types:  Stab incision   Incision depth:  Dermal   Scalpel blade:  11   Wound management:  Probed and deloculated and irrigated with saline   Drainage:  Bloody and serosanguinous   Drainage amount:  Scant   Wound treatment:  Wound left open Post-procedure details:    Patient tolerance of procedure:  Tolerated well, no immediate complications    ____________________________________________  INITIAL IMPRESSION / MDM / ASSESSMENT AND PLAN / ED COURSE  As part of my medical decision making, I reviewed the following data within the [place] notes reviewed and incorporated, Old chart reviewed, Notes from prior ED visits, and [place] Controlled Substance [place]son was evaluated in Emergency Department on 03/03/2020 for the symptoms described in the history of present illness. He was evaluated in the context of the global COVID-19 pandemic, which necessitated consideration that the patient might be at risk for infection with the SARS-CoV-2 virus that causes COVID-19. Institutional  protocols and algorithms that pertain to the evaluation of patients at risk for COVID-19 are in a state of rapid change based on information released by regulatory bodies including the CDC and federal and state organizations. These policies and algorithms were followed during the patient's care in the ED.  Some ED evaluations and interventions may be delayed as a result of limited staffing during the pandemic.*     Medical Decision Making: 49 year old Hispanic male here with left temporal area growth.  Primary concern is possible skin cancer, with consideration of infected sebaceous cyst as well.  There does appear to be a superficial area of infection.  Discussed the risks and benefits of incision and drainage given the possibility of skin cancer, and he is adamant that this be drained today.  A horizontal incision was made to prevent any extension of possible malignancy, and this was drained.  I had a very long discussion about the high risk of skin cancer and the need for urgent follow-up.  He is demanding that this is removed today in the ER and does not understand why he cannot go to the operating room.  I discussed that this would need to be done in concert with a dermatologist or plastic surgeon and will require a significant amount of resources as well as knwon tissue diagnosis.  Discussed that I am unable to take it off at this time, at which he expressed frustration but understanding.  Will give him a brief course of antibiotics to prevent superinfection, and advised him to follow-up with his doctor.  I placed urgent referrals for dermatology as well as social work to assist with arranging this.  ____________________________________________  FINAL CLINICAL IMPRESSION(S) / ED DIAGNOSES  Final diagnoses:  Facial mass     MEDICATIONS GIVEN DURING THIS VISIT:  Medications  oxyCODONE (Oxy IR/ROXICODONE) immediate release tablet 5 mg (5 mg Oral Given 03/03/20 0848)  acetaminophen (TYLENOL)  tablet 1,000 mg (1,000 mg Oral Given 03/03/20 0848)  lidocaine-EPINEPHrine (XYLOCAINE W/EPI) 2 %-1:100000 (with pres) injection 20 mL (20 mLs Intradermal Given 03/03/20 1700)  oxyCODONE-acetaminophen (PERCOCET/ROXICET) 5-325 MG per tablet 2 tablet (2 tablets Oral Given 03/03/20 1623)  ondansetron (ZOFRAN-ODT) disintegrating tablet 4 mg (4 mg Oral Given 03/03/20 1623)  doxycycline (VIBRA-TABS) tablet 100 mg (100 mg Oral Given 03/03/20 1751)     ED Discharge Orders         Ordered    Ambulatory referral to Social Work     Discontinue  Reprint    Comments: Patient with large mass on face. Will need surgery and evaluation. Initial request is Dermatology, but if unable please attempt Plastic Surgery or General Surgery consults   03/03/20 1716    doxycycline (VIBRAMYCIN) 100 MG capsule  2 times daily     Discontinue  Reprint     03/03/20 1730    oxyCODONE-acetaminophen (PERCOCET) 5-325 MG tablet  Every 6 hours PRN     Discontinue  Reprint     03/03/20 1730    Ambulatory referral to Dermatology     Discontinue  Reprint     03/03/20 1731           Note:  This document was prepared using Dragon voice recognition software and may include unintentional dictation errors.   Duffy Bruce, MD 03/03/20 [phone]

## 2020-03-03 NOTE — ED Notes (Signed)
Pt visualized in NAD at this time. Pt resting on bench at this time playing on his phone. VS obtained by this RN at this time.

## 2020-03-03 NOTE — Discharge Instructions (Addendum)
Llame al dermatlogo al nmero anterior el LUNES para un SEGUIMIENTO URGENTE por posible CNCER DE PIEL.  Hice una llamada al [employer] social que debera llamarte esta semana para ayudar con el seguimiento, los viajes y Conservation officer, nature. [place].

## 2020-03-03 NOTE — ED Notes (Signed)
EDP at bedside  

## 2020-04-15 ENCOUNTER — Inpatient Hospital Stay
Admission: EM | Admit: 2020-04-15 | Discharge: 2020-04-17 | DRG: 640 | Disposition: A | Discharge status: Home / Self Care | Payer: Self-pay | Attending: Internal Medicine | Admitting: Internal Medicine

## 2020-04-15 ENCOUNTER — Other Ambulatory Visit: Payer: Self-pay

## 2020-04-15 ENCOUNTER — Encounter: Payer: Self-pay | Admitting: Emergency Medicine

## 2020-04-15 ENCOUNTER — Emergency Department: Payer: Self-pay

## 2020-04-15 DIAGNOSIS — Z20822 Contact with and (suspected) exposure to covid-19: Secondary | ICD-10-CM | POA: Diagnosis present

## 2020-04-15 DIAGNOSIS — I5032 Chronic diastolic (congestive) heart failure: Secondary | ICD-10-CM | POA: Diagnosis present

## 2020-04-15 DIAGNOSIS — N186 End stage renal disease: Secondary | ICD-10-CM

## 2020-04-15 DIAGNOSIS — D735 Infarction of spleen: Secondary | ICD-10-CM | POA: Diagnosis present

## 2020-04-15 DIAGNOSIS — M7989 Other specified soft tissue disorders: Secondary | ICD-10-CM | POA: Diagnosis present

## 2020-04-15 DIAGNOSIS — E875 Hyperkalemia: Principal | ICD-10-CM | POA: Diagnosis present

## 2020-04-15 DIAGNOSIS — I10 Essential (primary) hypertension: Secondary | ICD-10-CM | POA: Diagnosis present

## 2020-04-15 DIAGNOSIS — N2581 Secondary hyperparathyroidism of renal origin: Secondary | ICD-10-CM | POA: Diagnosis present

## 2020-04-15 DIAGNOSIS — Z833 Family history of diabetes mellitus: Secondary | ICD-10-CM

## 2020-04-15 DIAGNOSIS — Z720 Tobacco use: Secondary | ICD-10-CM | POA: Diagnosis present

## 2020-04-15 DIAGNOSIS — R55 Syncope and collapse: Secondary | ICD-10-CM | POA: Diagnosis present

## 2020-04-15 DIAGNOSIS — Z841 Family history of disorders of kidney and ureter: Secondary | ICD-10-CM

## 2020-04-15 DIAGNOSIS — T45516A Underdosing of anticoagulants, initial encounter: Secondary | ICD-10-CM | POA: Diagnosis present

## 2020-04-15 DIAGNOSIS — D631 Anemia in chronic kidney disease: Secondary | ICD-10-CM | POA: Diagnosis present

## 2020-04-15 DIAGNOSIS — R188 Other ascites: Secondary | ICD-10-CM

## 2020-04-15 DIAGNOSIS — F17210 Nicotine dependence, cigarettes, uncomplicated: Secondary | ICD-10-CM | POA: Diagnosis present

## 2020-04-15 DIAGNOSIS — Y929 Unspecified place or not applicable: Secondary | ICD-10-CM

## 2020-04-15 DIAGNOSIS — I132 Hypertensive heart and chronic kidney disease with heart failure and with stage 5 chronic kidney disease, or end stage renal disease: Secondary | ICD-10-CM | POA: Diagnosis present

## 2020-04-15 DIAGNOSIS — D485 Neoplasm of uncertain behavior of skin: Secondary | ICD-10-CM | POA: Diagnosis present

## 2020-04-15 DIAGNOSIS — I340 Nonrheumatic mitral (valve) insufficiency: Secondary | ICD-10-CM | POA: Diagnosis present

## 2020-04-15 DIAGNOSIS — K7460 Unspecified cirrhosis of liver: Secondary | ICD-10-CM | POA: Diagnosis present

## 2020-04-15 DIAGNOSIS — Z91128 Patient's intentional underdosing of medication regimen for other reason: Secondary | ICD-10-CM

## 2020-04-15 DIAGNOSIS — Z888 Allergy status to other drugs, medicaments and biological substances status: Secondary | ICD-10-CM

## 2020-04-15 DIAGNOSIS — Z79899 Other long term (current) drug therapy: Secondary | ICD-10-CM

## 2020-04-15 DIAGNOSIS — Z9115 Patient's noncompliance with renal dialysis: Secondary | ICD-10-CM

## 2020-04-15 DIAGNOSIS — Z7901 Long term (current) use of anticoagulants: Secondary | ICD-10-CM

## 2020-04-15 DIAGNOSIS — Z992 Dependence on renal dialysis: Secondary | ICD-10-CM

## 2020-04-15 DIAGNOSIS — R001 Bradycardia, unspecified: Secondary | ICD-10-CM | POA: Diagnosis present

## 2020-04-15 LAB — COMPREHENSIVE METABOLIC PANEL
ALT: 17 U/L (ref 0–44)
AST: 26 U/L (ref 15–41)
Albumin: 3.7 g/dL (ref 3.5–5.0)
Alkaline Phosphatase: 164 U/L — ABNORMAL HIGH (ref 38–126)
Anion gap: 19 — ABNORMAL HIGH (ref 5–15)
BUN: 66 mg/dL — ABNORMAL HIGH (ref 6–20)
CO2: 24 mmol/L (ref 22–32)
Calcium: 7 mg/dL — ABNORMAL LOW (ref 8.9–10.3)
Chloride: 91 mmol/L — ABNORMAL LOW (ref 98–111)
Creatinine, Ser: 11.82 mg/dL — ABNORMAL HIGH (ref 0.61–1.24)
GFR calc Af Amer: 5 mL/min — ABNORMAL LOW (ref >60)
GFR calc non Af Amer: 4 mL/min — ABNORMAL LOW (ref >60)
Glucose, Bld: 82 mg/dL (ref 70–99)
Potassium: 6.4 mmol/L (ref 3.5–5.1)
Sodium: 134 mmol/L — ABNORMAL LOW (ref 135–145)
Total Bilirubin: 1.2 mg/dL (ref 0.3–1.2)
Total Protein: 7.2 g/dL (ref 6.5–8.1)

## 2020-04-15 LAB — AMMONIA: Ammonia: 41 umol/L — ABNORMAL HIGH (ref 9–35)

## 2020-04-15 LAB — CBC
HCT: 35 % — ABNORMAL LOW (ref 39.0–52.0)
Hemoglobin: 11.8 g/dL — ABNORMAL LOW (ref 13.0–17.0)
MCH: 34 pg (ref 26.0–34.0)
MCHC: 33.7 g/dL (ref 30.0–36.0)
MCV: 100.9 fL — ABNORMAL HIGH (ref 80.0–100.0)
Platelets: 91 10*3/uL — ABNORMAL LOW (ref 150–400)
RBC: 3.47 MIL/uL — ABNORMAL LOW (ref 4.22–5.81)
RDW: 13.2 % (ref 11.5–15.5)
WBC: 4.9 10*3/uL (ref 4.0–10.5)
nRBC: 0 % (ref 0.0–0.2)

## 2020-04-15 LAB — PROTIME-INR
INR: 1.3 — ABNORMAL HIGH (ref 0.8–1.2)
Prothrombin Time: 15.2 seconds (ref 11.4–15.2)

## 2020-04-15 LAB — SARS CORONAVIRUS 2 BY RT PCR (HOSPITAL ORDER, PERFORMED IN ~~LOC~~ HOSPITAL LAB): SARS Coronavirus 2: NEGATIVE

## 2020-04-15 LAB — APTT: aPTT: 37 seconds — ABNORMAL HIGH (ref 24–36)

## 2020-04-15 LAB — LIPASE, BLOOD: Lipase: 34 U/L (ref 11–51)

## 2020-04-15 MED ORDER — AMLODIPINE BESYLATE 5 MG PO TABS
5.0000 mg | ORAL_TABLET | Freq: Every day | ORAL | Status: DC
  Administered 2020-04-15 – 2020-04-17 (×3): 5 mg via ORAL
  Filled 2020-04-15 (×3): qty 1

## 2020-04-15 MED ORDER — NICOTINE 21 MG/24HR TD PT24
21.0000 mg | MEDICATED_PATCH | Freq: Every day | TRANSDERMAL | Status: DC
  Administered 2020-04-15 – 2020-04-17 (×3): 21 mg via TRANSDERMAL
  Filled 2020-04-15 (×3): qty 1

## 2020-04-15 MED ORDER — CALCIUM GLUCONATE 10 % IV SOLN
1.0000 g | Freq: Once | INTRAVENOUS | Status: AC
  Administered 2020-04-15: 1 g via INTRAVENOUS
  Filled 2020-04-15: qty 10

## 2020-04-15 MED ORDER — DEXTROSE 50 % IV SOLN
1.0000 | Freq: Once | INTRAVENOUS | Status: AC
  Administered 2020-04-15: 50 mL via INTRAVENOUS
  Filled 2020-04-15: qty 50

## 2020-04-15 MED ORDER — ACETAMINOPHEN 325 MG PO TABS: 650.0000 mg | ORAL_TABLET | Freq: Four times a day (QID) | ORAL | Status: DC | PRN

## 2020-04-15 MED ORDER — SODIUM CHLORIDE 0.9 % IV SOLN: 100.0000 mL | INTRAVENOUS | Status: DC | PRN

## 2020-04-15 MED ORDER — SODIUM CHLORIDE 0.9 % IV BOLUS
500.0000 mL | Freq: Once | INTRAVENOUS | Status: AC
  Administered 2020-04-15: 500 mL via INTRAVENOUS

## 2020-04-15 MED ORDER — DOXYCYCLINE HYCLATE 100 MG PO TABS
100.0000 mg | ORAL_TABLET | Freq: Two times a day (BID) | ORAL | Status: DC
  Administered 2020-04-15 – 2020-04-17 (×4): 100 mg via ORAL
  Filled 2020-04-15 (×4): qty 1

## 2020-04-15 MED ORDER — OXYCODONE-ACETAMINOPHEN 5-325 MG PO TABS
1.0000 | ORAL_TABLET | Freq: Four times a day (QID) | ORAL | Status: DC | PRN
  Administered 2020-04-16 – 2020-04-17 (×3): 1 via ORAL
  Filled 2020-04-15 (×3): qty 1

## 2020-04-15 MED ORDER — APIXABAN 5 MG PO TABS
10.0000 mg | ORAL_TABLET | Freq: Two times a day (BID) | ORAL | Status: DC
  Administered 2020-04-15 – 2020-04-16 (×2): 10 mg via ORAL
  Filled 2020-04-15 (×2): qty 2

## 2020-04-15 MED ORDER — PENTAFLUOROPROP-TETRAFLUOROETH EX AERO
1.0000 "application " | INHALATION_SPRAY | CUTANEOUS | Status: DC | PRN
  Filled 2020-04-15: qty 30

## 2020-04-15 MED ORDER — PATIROMER SORBITEX CALCIUM 8.4 G PO PACK
8.4000 g | PACK | Freq: Every day | ORAL | Status: DC
  Administered 2020-04-15: 8.4 g via ORAL
  Filled 2020-04-15 (×2): qty 1

## 2020-04-15 MED ORDER — INSULIN ASPART 100 UNIT/ML ~~LOC~~ SOLN
8.0000 [IU] | Freq: Once | SUBCUTANEOUS | Status: AC
  Administered 2020-04-15: 8 [IU] via INTRAVENOUS
  Filled 2020-04-15: qty 1

## 2020-04-15 MED ORDER — LIDOCAINE-PRILOCAINE 2.5-2.5 % EX CREA: 1.0000 "application " | TOPICAL_CREAM | CUTANEOUS | Status: DC | PRN

## 2020-04-15 MED ORDER — MORPHINE SULFATE (PF) 4 MG/ML IV SOLN
4.0000 mg | Freq: Once | INTRAVENOUS | Status: AC
  Administered 2020-04-15: 4 mg via INTRAVENOUS
  Filled 2020-04-15: qty 1

## 2020-04-15 MED ORDER — ONDANSETRON HCL 4 MG/2ML IJ SOLN
4.0000 mg | Freq: Three times a day (TID) | INTRAMUSCULAR | Status: DC | PRN
  Administered 2020-04-16: 4 mg via INTRAVENOUS
  Filled 2020-04-15 (×3): qty 2

## 2020-04-15 MED ORDER — LIDOCAINE HCL (PF) 1 % IJ SOLN
5.0000 mL | INTRAMUSCULAR | Status: DC | PRN
  Filled 2020-04-15: qty 5

## 2020-04-15 MED ORDER — ATROPINE SULFATE 1 MG/10ML IJ SOSY: 0.5000 mg | PREFILLED_SYRINGE | INTRAMUSCULAR | Status: DC | PRN

## 2020-04-15 MED ORDER — HYDRALAZINE HCL 20 MG/ML IJ SOLN
5.0000 mg | INTRAMUSCULAR | Status: DC | PRN
  Administered 2020-04-17: 5 mg via INTRAVENOUS
  Filled 2020-04-15: qty 1

## 2020-04-15 MED ORDER — OXYCODONE-ACETAMINOPHEN 5-325 MG PO TABS
1.0000 | ORAL_TABLET | Freq: Once | ORAL | Status: AC
  Administered 2020-04-15: 1 via ORAL
  Filled 2020-04-15: qty 1

## 2020-04-15 MED ORDER — ALTEPLASE 2 MG IJ SOLR
2.0000 mg | Freq: Once | INTRAMUSCULAR | Status: DC | PRN
  Filled 2020-04-15: qty 2

## 2020-04-15 MED ORDER — APIXABAN 5 MG PO TABS: 5.0000 mg | ORAL_TABLET | Freq: Two times a day (BID) | ORAL | Status: DC

## 2020-04-15 MED ORDER — HEPARIN SODIUM (PORCINE) 1000 UNIT/ML DIALYSIS
1000.0000 [IU] | INTRAMUSCULAR | Status: DC | PRN
  Filled 2020-04-15: qty 1

## 2020-04-15 MED ORDER — AMLODIPINE BESYLATE 5 MG PO TABS: 10.0000 mg | ORAL_TABLET | Freq: Every day | ORAL | Status: DC

## 2020-04-15 MED ORDER — DOXYCYCLINE HYCLATE 100 MG PO TABS
100.0000 mg | ORAL_TABLET | Freq: Once | ORAL | Status: AC
  Administered 2020-04-15: 100 mg via ORAL
  Filled 2020-04-15: qty 1

## 2020-04-15 MED ORDER — CHLORHEXIDINE GLUCONATE CLOTH 2 % EX PADS
6.0000 | MEDICATED_PAD | Freq: Every day | CUTANEOUS | Status: DC
  Administered 2020-04-17: 6 via TOPICAL
  Filled 2020-04-15 (×2): qty 6

## 2020-04-15 MED ORDER — MORPHINE SULFATE (PF) 2 MG/ML IV SOLN
2.0000 mg | INTRAVENOUS | Status: DC | PRN
  Administered 2020-04-16 (×3): 2 mg via INTRAVENOUS
  Filled 2020-04-15 (×3): qty 1

## 2020-04-15 NOTE — Progress Notes (Signed)
ANTICOAGULATION CONSULT NOTE - Initial Consult  Pharmacy Consult for Apixaban Indication: Splenic Infarct  Allergies  Allergen Reactions  . Betadine [Povidone Iodine] Rash    Patient Measurements: Height: 5' 2.99" (160 cm) Weight: 72.6 kg (160 lb) IBW/kg (Calculated) : 56.88 Heparin Dosing Weight:    Vital Signs: Temp: 97.6 F (36.4 C) (09/12 1113) Temp Source: Oral (09/12 1113) BP: 131/68 (09/12 1900) Pulse Rate: 65 (09/12 1900)  Labs: Recent Labs    04/15/20 0904 04/15/20 1504  HGB 11.8*  --   HCT 35.0*  --   PLT 91*  --   APTT  --  37*  LABPROT  --  15.2  INR  --  1.3*  CREATININE 11.82*  --     Estimated Creatinine Clearance (by C-G formula based on SCr of 11.82 mg/dL (H)) Male: 5.5 mL/min (A) Male: 6.8 mL/min (A)   Medical History: Past Medical History:  Diagnosis Date  . Abdominal pain 08/28/2016  . Acute hyperkalemia 10/28/2016  . Chest pain 04/11/2016  . Chronic combined systolic and diastolic CHF (congestive heart failure) ([place])   . Cirrhosis ([place])   . Dialysis patient ([place])   . ESRD (end stage renal disease) on dialysis ([place]) 01/25/2015  . Fluid overload 04/12/2016  . HTN (hypertension) 10/13/2016  . Hyperkalemia 11/27/2016  . Hypertension   . Mitral regurgitation   . Pulmonary edema 07/03/2016  . Renal disorder   . Renal insufficiency   . Scrotal infection 01/25/2015    Assessment: Patient is a 49yo male with ESRD on 3 times a week hemodialysis. Pharmacy consulted for apixaban dosing for splenic infarct.   Plan:  Will order Apixaban 10mg  PO bid times 7 days followed by 5mg  PO bid. Will monitor for signs of bleeding.  Paulina Fusi, PharmD, BCPS 04/15/2020 7:09 PM

## 2020-04-15 NOTE — ED Notes (Signed)
Dr Blaine Hamper at bedside to examine pt changes at this time. Dr Holley Raring paged to report changes to him and obtain possible eta for dialysis for this pt.

## 2020-04-15 NOTE — ED Notes (Signed)
Assumed care of pt, pt at dialysis at this time.

## 2020-04-15 NOTE — H&P (Addendum)
History and Physical    [address] Mario [name]:[id number] DOB: 24-Jun-1971 DOA: 04/15/2020  Referring MD/NP/PA:   PCP: [name], MD   Patient coming from:  The patient is coming from home.  At baseline, pt is independent for most of ADL.        Chief Complaint: mass pain in left temple area  HPI: Mario [name] is a 49 y.o. adult with medical history significant of ESRD-HD (TTS), hypertension, mitral valve regurgitation, cirrhosis, dCHF, anemia, tobacco abuse, splenic infarction, who presents with mass pain in left temple area.  Pt has a small mass in left temple area for several months.  Patient was seen in ED on 03/03/20. Dr. Ellender Hose tried to drain this lesion. Per his clinic note, it is concerned for possibility of a skin cancer, with consideration of infected sebaceous cyst as well.  Pt was given a course of doxycycline treatment. He was given urgent referrals for dermatology, but pt did not f/u with dermatologist. Pt states that he has severe pain in that area with little fluid draining.  Denies fever or chills.  Patient reported to ED physician that he has nausea and abdominal pain, but he denies abdominal pain to me.  Patient has no vomiting or diarrhea.  Denies chest pain, shortness breath, cough.  No unilateral weakness. Patient missed dialysis on Saturday.  While waiting in ED for admission, patient had developed 1 episode of near syncope.  Patient suddenly become diaphoretic, with bradycardia heart rate 40 to 50s.  No facial droop or slurred speech.  Patient moves all extremities normally.  ED Course: pt was found to have hyperkalemia with potassium 6.4, bicarbonate 24, creatinine 11.8, BUN 66, WBC 4.9, negative Covid PCR, lipase 34, blood pressure 208/80, 178/77, RR 16, oxygen saturation 100% on room air.  Chest x-ray showed cardiomegaly with vascular congestion and possible nipple shadow.  Patient is placed on MedSurg [place] for observation.  Dr. Holley Raring of renal  is consulted for dialysis.  Review of Systems:   General: no fevers, chills, no body weight gain, has fatigue HEENT: no blurry vision, hearing changes or sore throat Respiratory: no dyspnea, coughing, wheezing CV: no chest pain, no palpitations GI: has nausea, no  vomiting, abdominal pain, diarrhea, constipation GU: no dysuria, burning on urination, increased urinary frequency, hematuria  Ext: no leg edema Neuro: no unilateral weakness, numbness, or tingling, no vision change or hearing loss. Has near syncope. Skin: has a mass in left temple area MSK: No muscle spasm, no deformity, no limitation of range of movement in spin Heme: No easy bruising.  Travel history: No recent long distant travel.  Allergy:  Allergies  Allergen Reactions  . Betadine [Povidone Iodine] Rash    Past Medical History:  Diagnosis Date  . Abdominal pain 08/28/2016  . Acute hyperkalemia 10/28/2016  . Chest pain 04/11/2016  . Chronic combined systolic and diastolic CHF (congestive heart failure) ([place])   . Cirrhosis ([place])   . Dialysis patient ([place])   . ESRD (end stage renal disease) on dialysis ([place]) 01/25/2015  . Fluid overload 04/12/2016  . HTN (hypertension) 10/13/2016  . Hyperkalemia 11/27/2016  . Hypertension   . Mitral regurgitation   . Pulmonary edema 07/03/2016  . Renal disorder   . Renal insufficiency   . Scrotal infection 01/25/2015    Past Surgical History:  Procedure Laterality Date  . ANTERIOR CERVICAL CORPECTOMY N/A 04/05/2019   Procedure: ANTERIOR CERVICAL CORPECTOMY Cervical six and Cervical seven with strut graft fusion from  Cervical five-Thoracic one   ;  Surgeon: Earnie Larsson, MD;  Location: [facility];  Service: Neurosurgery;  Laterality: N/A;  . AV FISTULA PLACEMENT    . POSTERIOR CERVICAL FUSION/FORAMINOTOMY N/A 04/05/2019   Procedure: Posterior cervical fusion utilizing segmental instrumentation from Cervical five-Thoracic one ;  Surgeon: Earnie Larsson, MD;  Location: [facility];  Service:  Neurosurgery;  Laterality: N/A;    Social History:  reports that he has been smoking cigarettes. He has a 6.25 pack-year smoking history. He has never used smokeless tobacco. He reports that he does not drink alcohol and does not use drugs.  Family History:  Family History  Problem Relation Age of Onset  . Kidney failure Father   . Diabetes Mother      Prior to Admission medications   Medication Sig Start Date End Date Taking? Authorizing Provider  acetaminophen (TYLENOL) 500 MG tablet Take 500 mg by mouth every 6 (six) hours as needed for mild pain.    [provider]  amLODipine (NORVASC) 10 MG tablet Take 1 tablet (10 mg total) by mouth daily. Patient not taking: Reported on 12/27/2019 04/14/19 05/14/19  Darliss Cheney, MD  apixaban (ELIQUIS) 5 MG TABS tablet Take 1 tablet (5 mg total) by mouth 2 (two) times daily. 12/31/19   Lorella Nimrod, MD  ferric citrate (AURYXIA) 1 GM 210 MG(Fe) tablet Take 420 mg by mouth 3 (three) times daily with meals.     [provider]  irbesartan (AVAPRO) 300 MG tablet Take 1 tablet (300 mg total) by mouth at bedtime. 12/31/19   Lorella Nimrod, MD  lidocaine (LIDODERM) 5 % Place 1 patch onto the skin daily. Remove & Discard patch within 12 hours or as directed by MD 12/31/19   Lorella Nimrod, MD  oxyCODONE (OXY IR/ROXICODONE) 5 MG immediate release tablet Take 1 tablet (5 mg total) by mouth every 6 (six) hours as needed for up to 15 doses for moderate pain or severe pain. 12/31/19   Lorella Nimrod, MD  oxyCODONE-acetaminophen (PERCOCET) 5-325 MG tablet Take 1-2 tablets by mouth every 6 (six) hours as needed for moderate pain or severe pain. 03/03/20 03/03/21  Duffy Bruce, MD    Physical Exam: Vitals:   04/15/20 1845 04/15/20 1900 04/15/20 1915 04/15/20 1930  BP: (!) 141/71 131/68 118/67 128/72  Pulse: 63 65 69 65  Resp: 13 14 15 14   Temp:      TempSrc:      SpO2: 100% 98% 100% 100%  Weight:      Height:       General: Not in acute  distress HEENT:       Eyes: PERRL, EOMI, no scleral icterus.       ENT: No discharge from the ears and nose, no pharynx injection, no tonsillar enlargement.        Neck: No JVD, no bruit, no mass felt. Heme: No neck lymph node enlargement. Cardiac: S1/S2, RRR, No gallops or rubs. Respiratory:  No rales, wheezing, rhonchi or rubs. GI: Soft, nondistended, nontender, no rebound pain, no organomegaly, BS present. GU: No hematuria Ext: No pitting leg edema bilaterally. 1+DP/PT pulse bilaterally. Musculoskeletal: No joint deformities, No joint redness or warmth, no limitation of ROM in spin. Skin: has a mass in left temple area, ~3 x 3 cm in size, tender, dark colored, with little clear liquid draining. Neuro: Alert, oriented X3, cranial nerves II-XII grossly intact, moves all extremities normally. Muscle strength 5/5 in all extremities, sensation to light touch intact.  Psych:  Patient is not psychotic, no suicidal or hemocidal ideation.  Labs on Admission: I have personally reviewed following labs and imaging studies  CBC: Recent Labs  Lab 04/15/20 0904  WBC 4.9  HGB 11.8*  HCT 35.0*  MCV 100.9*  PLT 91*   Basic Metabolic Panel: Recent Labs  Lab 04/15/20 0904  NA 134*  K 6.4*  CL 91*  CO2 24  GLUCOSE 82  BUN 66*  CREATININE 11.82*  CALCIUM 7.0*   GFR: Estimated Creatinine Clearance (by C-G formula based on SCr of 11.82 mg/dL (H)) Male: 5.5 mL/min (A) Male: 6.8 mL/min (A) Liver Function Tests: Recent Labs  Lab 04/15/20 0904  AST 26  ALT 17  ALKPHOS 164*  BILITOT 1.2  PROT 7.2  ALBUMIN 3.7   Recent Labs  Lab 04/15/20 0904  LIPASE 34   Recent Labs  Lab 04/15/20 1504  AMMONIA 41*   Coagulation Profile: Recent Labs  Lab 04/15/20 1504  INR 1.3*   Cardiac Enzymes: No results for input(s): CKTOTAL, CKMB, CKMBINDEX, TROPONINI in the last 168 hours. BNP (last 3 results) No results for input(s): PROBNP in the last 8760 hours. HbA1C: No results for  input(s): HGBA1C in the last 72 hours. CBG: No results for input(s): GLUCAP in the last 168 hours. Lipid Profile: No results for input(s): CHOL, HDL, LDLCALC, TRIG, CHOLHDL, LDLDIRECT in the last 72 hours. Thyroid Function Tests: No results for input(s): TSH, T4TOTAL, FREET4, T3FREE, THYROIDAB in the last 72 hours. Anemia Panel: No results for input(s): VITAMINB12, FOLATE, FERRITIN, TIBC, IRON, RETICCTPCT in the last 72 hours. Urine analysis: No results found for: COLORURINE, APPEARANCEUR, LABSPEC, PHURINE, GLUCOSEU, HGBUR, BILIRUBINUR, KETONESUR, PROTEINUR, UROBILINOGEN, NITRITE, LEUKOCYTESUR Sepsis Labs: @LABRCNTIP (procalcitonin:4,lacticidven:4) ) Recent Results (from the past 240 hour(s))  SARS Coronavirus 2 by RT PCR (hospital order, performed in [facility] hospital lab) Nasopharyngeal Nasopharyngeal Swab     Status: None   Collection Time: 04/15/20 10:58 AM   Specimen: Nasopharyngeal Swab  Result Value Ref Range Status   SARS Coronavirus 2 NEGATIVE NEGATIVE Final    Comment: (NOTE) SARS-CoV-2 target nucleic acids are NOT DETECTED.  The SARS-CoV-2 RNA is generally detectable in upper and lower respiratory specimens during the acute phase of infection. The lowest concentration of SARS-CoV-2 viral copies this assay can detect is 250 copies / mL. A negative result does not preclude SARS-CoV-2 infection and should not be used as the sole basis for treatment or other patient management decisions.  A negative result may occur with improper specimen collection / handling, submission of specimen other than nasopharyngeal swab, presence of viral mutation(s) within the areas targeted by this assay, and inadequate number of viral copies (<250 copies / mL). A negative result must be combined with clinical observations, patient history, and epidemiological information.  Fact Sheet for Patients:   StrictlyIdeas.no  Fact Sheet for Healthcare  Providers: BankingDealers.co.za  This test is not yet approved or  cleared by the [place] FDA and has been authorized for detection and/or diagnosis of SARS-CoV-2 by FDA under an Emergency Use Authorization (EUA).  This EUA will remain in effect (meaning this test can be used) for the duration of the COVID-19 declaration under Section 564(b)(1) of the Act, 21 U.S.C. section 360bbb-3(b)(1), unless the authorization is terminated or revoked sooner.  Performed at [facility], [address], [place], [place] [address]      Radiological Exams on Admission: DG Chest Portable 1 View  Result Date: 04/15/2020 CLINICAL DATA:  End stage renal disease.  Recent missed dialysis. EXAM: PORTABLE CHEST 1 VIEW COMPARISON:  Dec 27, 2019 chest radiograph and chest CT Dec 28, 2019 FINDINGS: There is a probable nipple shadow on the right, unchanged. There is cardiomegaly with pulmonary venous hypertension. There is slight interstitial edema. There is no airspace opacity. There is aortic atherosclerosis. No adenopathy. There is postoperative change in the cervical spine. There are clips in the left axilla. IMPRESSION: Probable nipple shadow on the right; repeat study with nipple markers could confirm. Cardiomegaly with pulmonary vascular congestion and mild edema consistent with volume overload/congestive heart failure. No consolidation. Aortic Atherosclerosis (ICD10-I70.0). Electronically Signed   By: Lowella Grip III M.D.   On: 04/15/2020 12:53     EKG: Independently reviewed.  Sinus rhythm, QTC 486, LAD, T wave peaking in precordial leads, mild T wave inversion in lead III/aVF, poor R wave progression   Assessment/Plan Principal Problem:   Hyperkalemia Active Problems:   ESRD (end stage renal disease) on dialysis (HCC)   Chronic diastolic (congestive) heart failure (HCC)   Cirrhosis (HCC)   HTN (hypertension)   Tobacco abuse   Anemia in ESRD (end-stage renal  disease) ([place])   Splenic infarction   Soft tissue mass in left temple area   Near syncope   Hyperkalemia: Potassium 6.4, EKG showed T wave peaking in.  Patient has bradycardia.  -Placed on MedSurg bed for observation -Patient was treated with NovoLog, D50, calcium gluconate -Pariromer is ordered, not given yet. -Renal, Dr. Holley Raring is consulted for urgent dialysis  ESRD (end stage renal disease) on dialysis (TTS) -renal is consulted for HD  Chronic diastolic (congestive) heart failure ([place]): 2D echo on 12/29/2019 showed EF 55 to 60%, no leg edema or JVD.  CHF is compensated. -Volume management per renal by dialysis  Cirrhosis [facility]): Mental status normal -Check INR, PTT -Check ammonia level  HTN (hypertension): Patient is not taking medications at home.  Blood pressure is elevated to 80/80, 178/77 -IV hydralazine as needed -Start amlodipine 5 mg mg daily  Tobacco abuse -Nicotine patch  Anemia in ESRD (end-stage renal disease) ([place]) -Continue iron supplement  Splenic infarction: Patient is supposed to take Eliquis, but not taking his medications currently -Start Eliquis  Soft tissue mass in left temple area: Etiology is not clear.  Concerning for carcinoma. -Start doxycycline 100 mg twice daily empirically for possible infection -Need to give referral to dermatology for biopsy  Near syncope: Possibly due to vasovagal syncope versus bradycardia secondary to hyperkalemia.  Patient does not have focal neuro deficit on physical examination. Since pt will be restarted on Eliquis, will get CT-head without contrast -Frequent neuro check -As needed atropine for bradycardia -f/u CT-head.     DVT ppx: On Eliquis Code Status: Full code Family Communication: not done, no family member is at bed side.    Disposition Plan:  Anticipate discharge back to previous environment Consults called:  Dr. Holley Raring of renal Admission status: Med-surg bed for obs    Status is:  Observation  The patient remains OBS appropriate and will d/c before 2 midnights.  Dispo: The patient is from: Home              Anticipated d/c is to: Home              Anticipated d/c date is: 1 day              Patient currently is not medically stable to d/c.  Date of Service 04/15/2020    Ivor Costa [facility]   If 7PM-7AM, please contact night-coverage [email or web address] 04/15/2020, 7:42 PM

## 2020-04-15 NOTE — ED Triage Notes (Signed)
First RN Note: pt presents to ED via POV with c/o facial pain to L temporal area, pt seen with hx of same, pt c/o pain. Pt also states needs dialysis.   Interpreter requested by this RN.

## 2020-04-15 NOTE — ED Notes (Signed)
Pt back from dialysis, placed on bp, cardiac, and pulse ox monitor. Reporting 4/10 pain in L side of cheek, swelling lump present. Pt states he was seen last week for L cheek swelling. Managing secretions well, talking in full sentences with regular and unlabored breathing. Lights dimmed for comfort, blankets provided. Translator used to assess pt as he is primarily [place] speaking. AO x4.

## 2020-04-15 NOTE — ED Notes (Signed)
This RN notified dialysis ready for pt at this time. Pt transported to dialysis at this time

## 2020-04-15 NOTE — ED Triage Notes (Addendum)
Using video spanish interpreter:  Patient has a quarter sized mass to his left temple.  Patient states it feels like it is burning and is going to "burst."  Patient is a dialysis patient with a fistula to his left arm.  Patient states the past 3 nights patient has had difficulty sleeping from his pain.  Patient states he was seen in the ED previously and part of the area was removed and pain improved.  Patient states, "they told me if it grew again, they were going to transfer me, but I have no way to transfer."  Patient also reports he has had no appetite x 3 days and has not been eating.  Patient reports he has been vomiting but denies diarrhea. Patient reports some abdominal pain.  Patient denies vomiting in the past 24 hours, just dry heaves.  Patient is a Tues/Thurs/Sat dialysis patient but missed dialysis yesterday due to not feeling well.  Patient also reports cough, congestion, loss of taste and smell.  Patient reports being vaccinated against covid19.  Denies any known covid contacts.

## 2020-04-15 NOTE — Progress Notes (Signed)
[facility]  ROUNDING NOTE   Subjective:  Patient presented to [facility] with complaints of pain from a left temporal mass. This is, over several months. Having some drainage. Missed dialysis yesterday secondary to pain. Has significant hyperkalemia now.   Objective:  Vital signs in last 24 hours:  Temp:  [97.6 F (36.4 C)-98 F (36.7 C)] 97.6 F (36.4 C) (09/12 1113) Pulse Rate:  [64-75] 75 (09/12 1435) Resp:  [11-31] 31 (09/12 1435) BP: (161-197)/(72-86) 165/72 (09/12 1433) SpO2:  [100 %] 100 % (09/12 1435) Weight:  [72.6 kg] 72.6 kg (09/12 0903)  Weight change:  Filed Weights   04/15/20 0903  Weight: 72.6 kg    Intake/Output: No intake/output data recorded.   Intake/Output this shift:  No intake/output data recorded.  Physical Exam: General:  No acute distress  Head:  Left temporal mass  Eyes:  Anicteric  Neck:  Supple  Lungs:   Clear to auscultation, normal effort  Heart:  S1S2 no rubs  Abdomen:   Soft, nontender, bowel sounds present  Extremities:  Trace peripheral edema.  Neurologic:  Awake, alert, following commands  Skin:  No lesions  Access:  Left upper extremity AV access    Basic Metabolic Panel: Recent Labs  Lab 04/15/20 0904  NA 134*  K 6.4*  CL 91*  CO2 24  GLUCOSE 82  BUN 66*  CREATININE 11.82*  CALCIUM 7.0*    Liver Function Tests: Recent Labs  Lab 04/15/20 0904  AST 26  ALT 17  ALKPHOS 164*  BILITOT 1.2  PROT 7.2  ALBUMIN 3.7   Recent Labs  Lab 04/15/20 0904  LIPASE 34   No results for input(s): AMMONIA in the last 168 hours.  CBC: Recent Labs  Lab 04/15/20 0904  WBC 4.9  HGB 11.8*  HCT 35.0*  MCV 100.9*  PLT 91*    Cardiac Enzymes: No results for input(s): CKTOTAL, CKMB, CKMBINDEX, TROPONINI in the last 168 hours.  BNP: Invalid input(s): POCBNP  CBG: No results for input(s): GLUCAP in the last 168 hours.  Microbiology: Results for orders placed or performed during the hospital encounter of  04/15/20  SARS Coronavirus 2 by RT PCR (hospital order, performed in [facility] hospital lab) Nasopharyngeal Nasopharyngeal Swab     Status: None   Collection Time: 04/15/20 10:58 AM   Specimen: Nasopharyngeal Swab  Result Value Ref Range Status   SARS Coronavirus 2 NEGATIVE NEGATIVE Final    Comment: (NOTE) SARS-CoV-2 target nucleic acids are NOT DETECTED.  The SARS-CoV-2 RNA is generally detectable in upper and lower respiratory specimens during the acute phase of infection. The lowest concentration of SARS-CoV-2 viral copies this assay can detect is 250 copies / mL. A negative result does not preclude SARS-CoV-2 infection and should not be used as the sole basis for treatment or other patient management decisions.  A negative result may occur with improper specimen collection / handling, submission of specimen other than nasopharyngeal swab, presence of viral mutation(s) within the areas targeted by this assay, and inadequate number of viral copies (<250 copies / mL). A negative result must be combined with clinical observations, patient history, and epidemiological information.  Fact Sheet for Patients:   StrictlyIdeas.no  Fact Sheet for Healthcare Providers: BankingDealers.co.za  This test is not yet approved or  cleared by the [place] FDA and has been authorized for detection and/or diagnosis of SARS-CoV-2 by FDA under an Emergency Use Authorization (EUA).  This EUA will remain in effect (meaning this test  can be used) for the duration of the COVID-19 declaration under Section 564(b)(1) of the Act, 21 U.S.C. section 360bbb-3(b)(1), unless the authorization is terminated or revoked sooner.  Performed at [facility], [address] [place], [place] [address]     Coagulation Studies: No results for input(s): LABPROT, INR in the last 72 hours.  Urinalysis: No results for input(s): COLORURINE, LABSPEC,  PHURINE, GLUCOSEU, HGBUR, BILIRUBINUR, KETONESUR, PROTEINUR, UROBILINOGEN, NITRITE, LEUKOCYTESUR in the last 72 hours.  Invalid input(s): APPERANCEUR    Imaging: DG Chest Portable 1 View  Result Date: 04/15/2020 CLINICAL DATA:  End stage renal disease.  Recent missed dialysis. EXAM: PORTABLE CHEST 1 VIEW COMPARISON:  Dec 27, 2019 chest radiograph and chest CT Dec 28, 2019 FINDINGS: There is a probable nipple shadow on the right, unchanged. There is cardiomegaly with pulmonary venous hypertension. There is slight interstitial edema. There is no airspace opacity. There is aortic atherosclerosis. No adenopathy. There is postoperative change in the cervical spine. There are clips in the left axilla. IMPRESSION: Probable nipple shadow on the right; repeat study with nipple markers could confirm. Cardiomegaly with pulmonary vascular congestion and mild edema consistent with volume overload/congestive heart failure. No consolidation. Aortic Atherosclerosis (ICD10-I70.0). Electronically Signed   By: Lowella Grip III M.D.   On: 04/15/2020 12:53     Medications:    . nicotine  21 mg Transdermal Daily  . patiromer  8.4 g Oral Daily   acetaminophen, hydrALAZINE, morphine injection, ondansetron (ZOFRAN) IV, oxyCODONE-acetaminophen  Assessment/ Plan:  49 y.o. adult with past medical history of ESRD on HD TTS, chronic diastolic heart failure, cirrhosis of liver, hypertension, tobacco abuse, anemia of chronic disease, secondary hyperparathyroidism, splenic infarct who presents now with left temporal mass and missed dialysis treatment and hyperkalemia.  [facility]/TTS/[facility] nephrology  1.  ESRD on HD TTS/hyperkalemia.  Patient missed dialysis treatment yesterday secondary to pain from left temporal mass.  We will plan for urgent dialysis today given hyperkalemia.  Has been administered Veltassa 8.4 g x 1.  We will plan to dialyze the patient against a 2K bath.  2.  Anemia of chronic kidney  disease.  Hemoglobin 11.8.  Hold off on Epogen for now.  3.  Secondary hyperparathyroidism.  Evaluate bone mineral metabolism parameters over the course of the hospitalization.  4.  Thanks for consultation.   LOS: 0 Georgena Weisheit 9/12/20213:04 PM

## 2020-04-15 NOTE — ED Provider Notes (Addendum)
----------------------------------------- °  11:06 AM on 04/15/2020 -----------------------------------------  I was informed by the triage RN Jinny Blossom of a critical value of potassium 6.4.  I asked that an EKG be obtained, and I discussed with the charge RN that the patient needs to be brought back to room as soon as possible.    ----------------------------------------- 11:32 AM on 04/15/2020 -----------------------------------------  EKG reviewed and shows a normal QRS and no significantly peaked T waves.   Arta Silence, MD 04/15/20 1109    Arta Silence, MD 04/15/20 [phone]

## 2020-04-15 NOTE — ED Notes (Addendum)
Pt states he feels dizzy, "like im drunk", reports feeling hot "like Im burning, pt now very diaphoretic with visible sweat on the face and chest. Reporting 10/10 pain all over. MD Blaine Hamper, and MD siadecki made aware of pt current status. Bed linens wet from diaphoresis, pt thrashing in bed unable to get comfortable.

## 2020-04-15 NOTE — ED Notes (Addendum)
Dr. Blaine Hamper made aware that pt HR is dropping down to 48. He gave verbal orders to this RN to admin 0.5mg  IV atropine if HR drops below 40 bpm.

## 2020-04-15 NOTE — ED Provider Notes (Signed)
[facility] Emergency Department Provider Note ____________________________________________   First MD Initiated Contact with Patient 04/15/20 1144     (approximate)  I have reviewed the triage vital signs and the nursing notes.   HISTORY  Chief Complaint Mass and Abdominal Pain  History of present illness and ROS obtained via in-person Spanish interpreter  HPI [facility] Dessie Coma is a 49 y.o. adult with PMH as noted below who presents with a left temporal mass, gradual onset over the last few months, with acute pain over the last 3 days.  He states that it has been draining small amount of pus.  He denies fever or chills.  He states that he missed dialysis yesterday because he was not feeling well.  He has had some cough but otherwise denies any acute complaints.  He reports that after being seen in the ED for this mass in July, he has not followed up with the specialist because he is unable to get transportation.  Past Medical History:  Diagnosis Date  . Abdominal pain 08/28/2016  . Acute hyperkalemia 10/28/2016  . Chest pain 04/11/2016  . Chronic combined systolic and diastolic CHF (congestive heart failure) ([place])   . Cirrhosis ([place])   . Dialysis patient ([place])   . ESRD (end stage renal disease) on dialysis ([place]) 01/25/2015  . Fluid overload 04/12/2016  . HTN (hypertension) 10/13/2016  . Hyperkalemia 11/27/2016  . Hypertension   . Mitral regurgitation   . Pulmonary edema 07/03/2016  . Renal disorder   . Renal insufficiency   . Scrotal infection 01/25/2015    Patient Active Problem List   Diagnosis Date Noted  . Soft tissue mass in left temple area   . 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  . Tobacco abuse 04/16/2019  . Anemia in ESRD (end-stage renal disease) ([place]) 04/16/2019  . Neck pain, bilateral posterior 04/16/2019  . Cervical spinal stenosis 04/13/2019  . Cervical vertebral fracture ([place]) 04/13/2019  . Neck pain 04/03/2019  . Cervical  radiculopathy 04/03/2019  . Hypertensive urgency 04/01/2019  . Intractable abdominal pain 05/01/2018  . Gastroenteritis 10/02/2017  . Abdominal pain, acute 08/21/2017  . Malnutrition of moderate degree 08/21/2017  . SBP (spontaneous bacterial peritonitis) ([place]) 06/09/2017  . Erroneous encounter - disregard 03/04/2017  . Hyperkalemia 11/27/2016  . Acute hyperkalemia 10/28/2016  . Chronic diastolic (congestive) heart failure ([place]) 10/13/2016  . Cirrhosis ([place]) 10/13/2016  . HTN (hypertension) 10/13/2016  . Abdominal pain 08/28/2016  . Pulmonary edema 07/03/2016  . Fluid overload 04/12/2016  . Chest pain 04/11/2016  . Scrotal infection 01/25/2015  . ESRD (end stage renal disease) on dialysis ([name]) 01/25/2015    Past Surgical History:  Procedure Laterality Date  . ANTERIOR CERVICAL CORPECTOMY N/A 04/05/2019   Procedure: ANTERIOR CERVICAL CORPECTOMY Cervical six and Cervical seven with strut graft fusion from Cervical five-Thoracic one   ;  Surgeon: Earnie Larsson, MD;  Location: [place];  Service: Neurosurgery;  Laterality: N/A;  . AV FISTULA PLACEMENT    . POSTERIOR CERVICAL FUSION/FORAMINOTOMY N/A 04/05/2019   Procedure: Posterior cervical fusion utilizing segmental instrumentation from Cervical five-Thoracic one ;  Surgeon: Earnie Larsson, MD;  Location: [place];  Service: Neurosurgery;  Laterality: N/A;    Prior to Admission medications   Medication Sig Start Date End Date Taking? Authorizing Provider  acetaminophen (TYLENOL) 500 MG tablet Take 500-1,000 mg by mouth every 6 (six) hours as needed for mild pain or fever.    Yes [provider]  ferric citrate (AURYXIA) 1  GM 210 MG(Fe) tablet Take 420 mg by mouth 3 (three) times daily with meals.    Yes [provider]    Allergies Betadine [povidone iodine]  Family History  Problem Relation Age of Onset  . Kidney failure Father   . Diabetes Mother     Social History Social History   Tobacco Use  . Smoking status:  Current Every Day Smoker    Packs/day: 0.25    Years: 25.00    Pack years: 6.25    Types: Cigarettes  . Smokeless tobacco: Never Used  Substance Use Topics  . Alcohol use: No    Comment: pt denies  . Drug use: No    Review of Systems  Constitutional: No fever/chills. Eyes: No visual changes. ENT: No sore throat. Cardiovascular: Denies chest pain. Respiratory: Denies shortness of breath. Gastrointestinal: No vomiting or diarrhea.  Genitourinary: Negative for dysuria.  Musculoskeletal: Negative for back pain. Skin: Negative for rash. Neurological: Negative for headache.   ____________________________________________   PHYSICAL EXAM:  VITAL SIGNS: ED Triage Vitals  Enc Vitals Group     BP 04/15/20 0900 (!) 161/86     Pulse Rate 04/15/20 0900 69     Resp 04/15/20 0900 18     Temp 04/15/20 0900 98 F (36.7 C)     Temp Source 04/15/20 0900 Oral     SpO2 04/15/20 0900 100 %     Weight 04/15/20 0903 160 lb (72.6 kg)     Height 04/15/20 0903 5' 2.99" (1.6 m)     Head Circumference --      Peak Flow --      Pain Score 04/15/20 0902 10     Pain Loc --      Pain Edu? --      Excl. in [place]? --     Constitutional: Alert and oriented.  Uncomfortable appearing in no acute distress. Eyes: Conjunctivae are normal.  EOMI. Head: Atraumatic.  Approximately 3 cm left temporal mass, mildly tender.  Slight fluctuance.  No purulent drainage. Nose: No congestion/rhinnorhea. Mouth/Throat: Mucous membranes are moist.   Neck: Normal range of motion.  Cardiovascular: Normal rate, regular rhythm. Grossly normal heart sounds.  Good peripheral circulation. Respiratory: Normal respiratory effort.  No retractions. Lungs CTAB. Gastrointestinal:  No distention.  Musculoskeletal: No lower extremity edema.  Extremities warm and well perfused.  Neurologic:  Normal speech and language. No gross focal neurologic deficits are appreciated.  Skin:  Skin is warm and dry. No rash noted. Psychiatric:  Mood and affect are normal. Speech and behavior are normal.  ____________________________________________   LABS (all labs ordered are listed, but only abnormal results are displayed)  Labs Reviewed  COMPREHENSIVE METABOLIC PANEL - Abnormal; Notable for the following components:      Result Value   Sodium 134 (*)    Potassium 6.4 (*)    Chloride 91 (*)    BUN 66 (*)    Creatinine, Ser 11.82 (*)    Calcium 7.0 (*)    Alkaline Phosphatase 164 (*)    GFR calc non Af Amer 4 (*)    GFR calc Af Amer 5 (*)    Anion gap 19 (*)    All other components within normal limits  CBC - Abnormal; Notable for the following components:   RBC 3.47 (*)    Hemoglobin 11.8 (*)    HCT 35.0 (*)    MCV 100.9 (*)    Platelets 91 (*)    All other components within  normal limits  SARS CORONAVIRUS 2 BY RT PCR (HOSPITAL ORDER, [facility])  LIPASE, BLOOD  URINALYSIS, COMPLETE (UACMP) WITH MICROSCOPIC  PROTIME-INR  APTT  AMMONIA   ____________________________________________  EKG  ED ECG REPORT I, Arta Silence, the attending physician, personally viewed and interpreted this ECG.  Date: 04/15/2020 EKG Time: 1116 Rate: 64 Rhythm: normal sinus rhythm QRS Axis: normal Intervals: LAFB ST/T Wave abnormalities: Borderline peaked T waves Narrative Interpretation: no evidence of acute ischemia  ____________________________________________  RADIOLOGY  CXR: Cardiomegaly with mild pulmonary edema  ____________________________________________   PROCEDURES  Procedure(s) performed: No  Procedures  Critical Care performed: Yes  CRITICAL CARE Performed by: Arta Silence   Total critical care time: 30 minutes  Critical care time was exclusive of separately billable procedures and treating other patients.  Critical care was necessary to treat or prevent imminent or life-threatening deterioration.  Critical care was time spent personally by me on the  following activities: development of treatment plan with patient and/or surrogate as well as nursing, discussions with consultants, evaluation of patient's response to treatment, examination of patient, obtaining history from patient or surrogate, ordering and performing treatments and interventions, ordering and review of laboratory studies, ordering and review of radiographic studies, pulse oximetry and re-evaluation of patient's condition. ____________________________________________   INITIAL IMPRESSION / ASSESSMENT AND PLAN / ED COURSE  Pertinent labs & imaging results that were available during my care of the patient were reviewed by me and considered in my medical decision making (see chart for details).  49 year old male with PMH as noted above presents with mass to his left temple which has been present for several months, it was doing well after he was in the ED previously, and now has been painful and more swollen with some purulent drainage over the last 3 days.  He reports some cough and reported decrease of taste and smell in triage, but denies other acute symptoms.  I reviewed the past medical records in [place].  The patient was seen in the ED on 7/31 for this mass.  At that time, the primary concern was for possible skin cancer versus infected sebaceous cyst.  I&D was performed, and he was discharged with antibiotics.  The patient was referred to dermatology, but he states today that he did not follow-up.  Previously, he was admitted in May with left upper quadrant pain and splenic infarction.  On exam, he is uncomfortable but not acutely ill-appearing.  His vital signs are normal except for hypertension.  He has an approximately 3 cm mass to the left temple with no significant purulent drainage.  The remainder of the exam is unremarkable.  Initial lab work-up obtained from triage reveals hyperkalemia and and elevated anion gap.  EKG shows a normal QRS with borderline peaked T  waves.  1.  Left temporal mass: I agree with the prior evaluation that this is concerning for malignancy, and do not recommend I&D emergently to avoid cutting into a possible malignancy and causing extension.  We will give a dose of p.o. doxycycline, and he will need referral to dermatology.  2.  Hyperkalemia: Likely due to missed dialysis.  I will treat with IV calcium, insulin and D50, [place], and plan for nephrology consult and admission.  ----------------------------------------- 1:14 PM on 04/15/2020 -----------------------------------------  I consulted Dr. Holley Raring from nephrology to arrange for dialysis.  I then discussed the case with Dr. Blaine Hamper from the hospitalist service for admission.   __________________________  Leslee Home  was evaluated in Emergency Department on 04/15/2020 for the symptoms described in the history of present illness. He was evaluated in the context of the global COVID-19 pandemic, which necessitated consideration that the patient might be at risk for infection with the SARS-CoV-2 virus that causes COVID-19. Institutional protocols and algorithms that pertain to the evaluation of patients at risk for COVID-19 are in a state of rapid change based on information released by regulatory bodies including the CDC and federal and state organizations. These policies and algorithms were followed during the patient's care in the ED.   ____________________________________________   FINAL CLINICAL IMPRESSION(S) / ED DIAGNOSES  Final diagnoses:  Hyperkalemia  End stage renal disease ([place])  Soft tissue mass      NEW MEDICATIONS STARTED DURING THIS VISIT:  New Prescriptions   No medications on file     Note:  This document was prepared using Dragon voice recognition software and may include unintentional dictation errors.    Arta Silence, MD 04/15/20 [phone]

## 2020-04-16 ENCOUNTER — Observation Stay: Payer: Self-pay

## 2020-04-16 ENCOUNTER — Encounter: Payer: Self-pay | Admitting: Internal Medicine

## 2020-04-16 DIAGNOSIS — E875 Hyperkalemia: Principal | ICD-10-CM

## 2020-04-16 LAB — CBC
HCT: 35.2 % — ABNORMAL LOW (ref 39.0–52.0)
Hemoglobin: 12.1 g/dL — ABNORMAL LOW (ref 13.0–17.0)
MCH: 34 pg (ref 26.0–34.0)
MCHC: 34.4 g/dL (ref 30.0–36.0)
MCV: 98.9 fL (ref 80.0–100.0)
Platelets: 95 10*3/uL — ABNORMAL LOW (ref 150–400)
RBC: 3.56 MIL/uL — ABNORMAL LOW (ref 4.22–5.81)
RDW: 13 % (ref 11.5–15.5)
WBC: 5.4 10*3/uL (ref 4.0–10.5)
nRBC: 0 % (ref 0.0–0.2)

## 2020-04-16 LAB — PHOSPHORUS: Phosphorus: 8.3 mg/dL — ABNORMAL HIGH (ref 2.5–4.6)

## 2020-04-16 LAB — BASIC METABOLIC PANEL
Anion gap: 18 — ABNORMAL HIGH (ref 5–15)
BUN: 29 mg/dL — ABNORMAL HIGH (ref 6–20)
CO2: 23 mmol/L (ref 22–32)
Calcium: 7.5 mg/dL — ABNORMAL LOW (ref 8.9–10.3)
Chloride: 95 mmol/L — ABNORMAL LOW (ref 98–111)
Creatinine, Ser: 7.18 mg/dL — ABNORMAL HIGH (ref 0.61–1.24)
GFR calc Af Amer: 9 mL/min — ABNORMAL LOW (ref >60)
GFR calc non Af Amer: 8 mL/min — ABNORMAL LOW (ref >60)
Glucose, Bld: 66 mg/dL — ABNORMAL LOW (ref 70–99)
Potassium: 5.6 mmol/L — ABNORMAL HIGH (ref 3.5–5.1)
Sodium: 136 mmol/L (ref 135–145)

## 2020-04-16 LAB — GLUCOSE, CAPILLARY
Glucose-Capillary: 66 mg/dL — ABNORMAL LOW (ref 70–99)
Glucose-Capillary: 95 mg/dL (ref 70–99)

## 2020-04-16 MED ORDER — ONDANSETRON HCL 4 MG/2ML IJ SOLN
4.0000 mg | INTRAMUSCULAR | Status: DC | PRN
  Administered 2020-04-16 – 2020-04-17 (×3): 4 mg via INTRAVENOUS
  Filled 2020-04-16 (×2): qty 2

## 2020-04-16 MED ORDER — APIXABAN 5 MG PO TABS
5.0000 mg | ORAL_TABLET | Freq: Two times a day (BID) | ORAL | Status: DC
  Administered 2020-04-16 – 2020-04-17 (×2): 5 mg via ORAL
  Filled 2020-04-16 (×2): qty 1

## 2020-04-16 MED ORDER — PATIROMER SORBITEX CALCIUM 8.4 G PO PACK
16.8000 g | PACK | Freq: Every day | ORAL | Status: DC
  Administered 2020-04-16: 16.8 g via ORAL
  Filled 2020-04-16 (×2): qty 2

## 2020-04-16 NOTE — TOC Initial Note (Addendum)
Transition of Care [facility]) - Initial/Assessment Note    Patient Details  Name: Mario [name] MRN: [id number] Date of Birth: [birth year]  Transition of Care [facility]) CM/SW Contact:    Ova Freshwater Phone Number: [phone] 04/16/2020, 10:16 AM  Clinical Narrative:                  Patient only speaks Spanish. Patient illiterate and is unable to read.  Patient presents to [facility]/ED due to facial pain.  Patient is also needs dialysis.    CSW spoke with patient.  Patient stated he wants to be transferred to another hospital to have the mass on his temporal areas removed, "because it causes me so much pain, I can't sleep."  Patient stated he moved to [place] three years ago, and although he has family in the area, he states "they do not acknowledge me."  Patient stated he lives off the kindness of others, "I'll ask friends to bring me food to cook, or give me little cash for food."  Patient stated he had not eaten in three days. Patient did not expand on how he pays bills or whether he has access to electricity or running water.  Patient reiterated all he wanted was to be sent to another hospital to remove the growth if no one was going to help him at [facility].   This CSW reached out to Attending for disposition update.  Attending stated patient will stay overnight to ensure he receives dialysis and is completely stable and then he will d/c tomorrow 9/14.    CSW spoke with patient for update on disposition.  CSW asked patient if he would be abel to do the application for [facility].  Patient stated he does not know how to read, and would not be abel to fill out the application.  CSW asked patient how he manages to get to dialysis, and patient stated he tells his friend when he has dialysis and he takes him and then picks him up after work.  Patient stated if he could get an appointment to go to [facility] to fill out the paperwork he would be able to ask his friend to take him.   CSW stated I would reach out to [facility] and find out if that was something that could be done.  CSW stated she would contact patient later on today with update. Patient verbalized understanding.  CSW left voicemail for Olivia Mackie and Anderson Malta at [employer] [phone].   Expected Discharge Plan: Home/Self Care Barriers to Discharge: Continued Medical Work up, Inadequate or no insurance, Undocumented, Transportation, Waiting for outpatient dialysis   Patient Goals and CMS Choice Patient states their goals for this hospitalization and ongoing recovery are:: "I want to be transfered to another hospital to get this cancerous tumor out of my head." CMS Medicare.gov Compare Post Acute Care list provided to:: Patient Choice offered to / list presented to : Patient  Expected Discharge Plan and Services Expected Discharge Plan: Home/Self Care In-house Referral: Clinical Social Work     Living arrangements for the past 2 months: Mobile Home                                      Prior Living Arrangements/Services Living arrangements for the past 2 months: Mobile Home Lives with:: Self Patient language and need for interpreter reviewed:: Yes Do  you feel safe going back to the place where you live?: Yes      Need for Family Participation in Patient Care: Yes (Comment) Care giver support system in place?: No (comment)   Criminal Activity/Legal Involvement Pertinent to Current Situation/Hospitalization: No - Comment as needed  Activities of Daily Living      Permission Sought/Granted Permission sought to share information with : Family Supports Permission granted to share information with : Yes, Verbal Permission Granted  Share Information with NAME: Alfaro,[place] Relative [phone]           Emotional Assessment Appearance:: Appears stated age Attitude/Demeanor/Rapport: Angry, Combative Affect (typically observed): Anxious, Agitated, Irritable Orientation:  : Oriented to Self, Oriented to Place, Oriented to  Time, Oriented to Situation Alcohol / Substance Use: Not Applicable Psych Involvement: No (comment)  Admission diagnosis:  Hyperkalemia [E87.5] Patient Active Problem List   Diagnosis Date Noted  . Near syncope 04/15/2020  . Soft tissue mass in left temple area   . 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  . Tobacco abuse 04/16/2019  . Anemia in ESRD (end-stage renal disease) ([place]) 04/16/2019  . Neck pain, bilateral posterior 04/16/2019  . Cervical spinal stenosis 04/13/2019  . Cervical vertebral fracture ([place]) 04/13/2019  . Neck pain 04/03/2019  . Cervical radiculopathy 04/03/2019  . Hypertensive urgency 04/01/2019  . Intractable abdominal pain 05/01/2018  . Gastroenteritis 10/02/2017  . Abdominal pain, acute 08/21/2017  . Malnutrition of moderate degree 08/21/2017  . SBP (spontaneous bacterial peritonitis) ([place]) 06/09/2017  . Erroneous encounter - disregard 03/04/2017  . Hyperkalemia 11/27/2016  . Acute hyperkalemia 10/28/2016  . Chronic diastolic (congestive) heart failure ([place]) 10/13/2016  . Cirrhosis ([place]) 10/13/2016  . HTN (hypertension) 10/13/2016  . Abdominal pain 08/28/2016  . Pulmonary edema 07/03/2016  . Fluid overload 04/12/2016  . Chest pain 04/11/2016  . Scrotal infection 01/25/2015  . ESRD (end stage renal disease) on dialysis ([place]) 01/25/2015   PCP:  [name], MD Pharmacy:   [facility] [address]), [place] [address] [place]) [place] [address] Phone: [phone] Fax: [place], [place] - [address] [place] [address] Phone: [phone] Fax: [phone]     Social Determinants of Health (SDOH) Interventions    Readmission Risk Interventions Readmission Risk Prevention Plan 12/30/2019  Transportation Screening Complete  PCP or Specialist Appt within 3-5 Days  Patient refused  Social Work Consult for [place] Planning/Counseling Complete  Palliative Care Screening Not Applicable  Medication Review Press photographer) Complete  Some recent data might be hidden

## 2020-04-16 NOTE — ED Notes (Signed)
Breakfast tray at bedside 

## 2020-04-16 NOTE — ED Notes (Addendum)
Pt on phone with Education officer, [employer]. Pt will work on drinking valtessa once nausea meds work

## 2020-04-16 NOTE — ED Notes (Signed)
Pt asleep, even unlabored respirations

## 2020-04-16 NOTE — Progress Notes (Signed)
PROGRESS NOTE    Mario [name]  NGE:[id number] DOB: January 10, 1971 DOA: 04/15/2020 PCP: [name], MD Brief Narrative: HPI: Mario [name] is a 49 y.o. adult with medical history significant of ESRD-HD (TTS), hypertension, mitral valve regurgitation, cirrhosis, dCHF, anemia, tobacco abuse, splenic infarction, who presents with mass pain in left temple area.  Pt has a small mass in left temple area for several months.  Patient was seen in ED on 03/03/20. Dr. Ellender Hose tried to drain this lesion. Per his clinic note, it is concerned for possibility of a skin cancer, with consideration of infected sebaceous cyst as well.  Pt was given a course of doxycycline treatment. He was given urgent referrals for dermatology, but pt did not f/u with dermatologist. Pt states that he has severe pain in that area with little fluid draining.  Denies fever or chills.  Patient reported to ED physician that he has nausea and abdominal pain, but he denies abdominal pain to me.  Patient has no vomiting or diarrhea.  Denies chest pain, shortness breath, cough.  No unilateral weakness. Patient missed dialysis on Saturday.  While waiting in ED for admission, patient had developed 1 episode of near syncope.  Patient suddenly become diaphoretic, with bradycardia heart rate 40 to 50s.  No facial droop or slurred speech.  Patient moves all extremities normally.  9/13: Patient seen and examined.  History conducted in [place].  Patient frustrated about continued pain from his left temporal lesion.  I had a lengthy conversation with him about his need to follow-up with dermatologist or least an outpatient primary care doctor as cutting into the mask could potentially cause extension of an underlying dermatologic malignancy.  This was difficult to communicate to given the language barrier.  Patient potassium has improved since emergent dialysis yesterday.  Case discussed with nephrology.  We will plan on dialysis  tomorrow 04/17/2020 and preparation for discharge  Had a lengthy conversation with the [facility] representative Teresita about this patient.  She spoke to them over the phone and stated that he requested to be transferred to another hospital.  Unfortunately at this time there is no justification for hospital hospital transfer and given the scarcity of beds due to the COVID-19 pandemic we are unable to transfer at this time.     Assessment & Plan:   Principal Problem:   Hyperkalemia Active Problems:   ESRD (end stage renal disease) on dialysis (HCC)   Chronic diastolic (congestive) heart failure (HCC)   Cirrhosis (HCC)   HTN (hypertension)   Tobacco abuse   Anemia in ESRD (end-stage renal disease) (HCC)   Splenic infarction   Soft tissue mass in left temple area   Near syncope  Hyperkalemia:  Potassium 6.4, EKG showed T wave peaking.   Patient has bradycardia.,  Now improved Status post emergent dialysis on 1221 Potassium improved Plan: Given the patient's need for dialysis tomorrow and the high likelihood that he would miss it we will plan on keeping the patient in house for planned hemodialysis tomorrow.  Case was discussed with Dr. Holley Raring from nephrology.  Veltassa ordered and dose adjusted per nephrology -Check a.m. renal function and electrolytes  ESRD (end stage renal disease) on dialysis (TTS) -renal is consulted for HD -See above.  Plan on dialysis in house tomorrow  Soft tissue mass in left temple area:  Etiology is not clear.  Concerning for carcinoma. -Patient was seen in the emergency room on 03/03/2020 for the same complaint.  At that time  he had an incision and drainage done in the emergency room.  Small amount of blood in pus liberated from the mass.  Patient was given doxycycline at that time but his adherence to this medication is unclear -Patient states that the mass is painful and is preventing him from sleeping.  Numerous providers at this time of educated the  patient on the importance of establishing outpatient follow-up.  However after discussion with case management it appears that the patient is undocumented, uninsured, illiterate, without transportation, without job, without money and has no ability to actually get into see outpatient providers -He requested transfer to another hospital.  We explained to him that hospital hospital transfer is not indicated in this circumstance.  Also we have very severe bed and staffing shortage right now due to the COVID-19 pandemic and we could not accommodate for hospital hospital transfer at this time -[place] is working [facility]  Chronic diastolic (congestive) heart failure ([place]): 2D echo on 12/29/2019 showed EF 55 to 60%, no leg edema or JVD.  CHF is compensated. -Volume management per renal by dialysis  Cirrhosis [facility]): Mental status normal -Check INR, PTT: Within normal limits -Check ammonia level: Within normal limits  HTN (hypertension): Patient is not taking medications at home.  Blood pressure is elevated to 178/77 -IV hydralazine as needed -Start amlodipine 5 mg mg daily  Tobacco abuse -Nicotine patch  Anemia in ESRD (end-stage renal disease) ([place]) -Continue iron supplement  Splenic infarction: Patient is supposed to take Eliquis, but not taking his medications currently -Start Eliquis  Near syncope: Possibly due to vasovagal syncope versus bradycardia secondary to hyperkalemia.  Patient does not have focal neuro deficit on physical examination. Since pt will be restarted on Eliquis, will get CT-head without contrast -Frequent neuro check -As needed atropine for bradycardia -f/u CT-head: negative   DVT prophylaxis: Eliquis Code Status: full Family Communication: None Disposition Plan: Status is: Observation  The patient remains OBS appropriate and will d/c before 2 midnights.  Dispo: The patient is from: Home               Anticipated d/c is to: Home              Anticipated d/c date is: 1 day              Patient currently is not medically stable to d/c.  Plan to keep patient in-house today with inpatient hemodialysis tomorrow.  The patient is very high risk for missing his scheduled hemodialysis tomorrow and thus would likely represent a high readmission risk.  After discussion with the patient, nephrology, case management we are electing to keep patient in-house today, plan for dialysis tomorrow morning and discharge afterwards.  Consultants:   Nephrology  Procedures:   Hemodialysis, 04/15/2020  Antimicrobials: (specify start and planned stop date. Auto populated tables are space occupying and do not give end dates)  Doxycycline   Subjective: Seen and examined.  Endorsing pain from left temporal lesion  Objective: Vitals:   04/16/20 0800 04/16/20 0900 04/16/20 1000 04/16/20 1100  BP: (!) 161/86 (!) 172/85 (!) 181/82 (!) 147/89  Pulse: 66 64 70 70  Resp: 17 12 (!) 21 16  Temp: 98.2 F (36.8 C)     TempSrc: Oral     SpO2: 98% 98% 98% 98%  Weight:      Height:        Intake/Output Summary (Last 24 hours) at 04/16/2020 1304 Last  data filed at 04/15/2020 2340 Gross per 24 hour  Intake 500 ml  Output 1615 ml  Net -1115 ml   Filed Weights   04/15/20 0903  Weight: 72.6 kg    Examination:  General exam: Appears calm and comfortable  Respiratory system: Clear to auscultation. Respiratory effort normal. Cardiovascular system: S1 & S2 heard, RRR. No JVD, murmurs, rubs, gallops or clicks. No pedal edema. Gastrointestinal system: Abdomen is nondistended, soft and nontender. No organomegaly or masses felt. Normal bowel sounds heard. Central nervous system: Alert and oriented. No focal neurological deficits. Extremities: Symmetric 5 x 5 power.  Left upper extremity aVF Skin: Raised lesion on left temple, tender to touch Psychiatry: Judgement and insight appear normal. Mood & affect  appropriate.     Data Reviewed: I have personally reviewed following labs and imaging studies  CBC: Recent Labs  Lab 04/15/20 0904 04/16/20 0427  WBC 4.9 5.4  HGB 11.8* 12.1*  HCT 35.0* 35.2*  MCV 100.9* 98.9  PLT 91* 95*   Basic Metabolic Panel: Recent Labs  Lab 04/15/20 0904 04/15/20 1504 04/16/20 0427  NA 134*  --  136  K 6.4*  --  5.6*  CL 91*  --  95*  CO2 24  --  23  GLUCOSE 82  --  66*  BUN 66*  --  29*  CREATININE 11.82*  --  7.18*  CALCIUM 7.0*  --  7.5*  PHOS  --  8.3*  --    GFR: Estimated Creatinine Clearance (by C-G formula based on SCr of 7.18 mg/dL (H)) Male: 9.1 mL/min (A) Male: 11.1 mL/min (A) Liver Function Tests: Recent Labs  Lab 04/15/20 0904  AST 26  ALT 17  ALKPHOS 164*  BILITOT 1.2  PROT 7.2  ALBUMIN 3.7   Recent Labs  Lab 04/15/20 0904  LIPASE 34   Recent Labs  Lab 04/15/20 1504  AMMONIA 41*   Coagulation Profile: Recent Labs  Lab 04/15/20 1504  INR 1.3*   Cardiac Enzymes: No results for input(s): CKTOTAL, CKMB, CKMBINDEX, TROPONINI in the last 168 hours. BNP (last 3 results) No results for input(s): PROBNP in the last 8760 hours. HbA1C: No results for input(s): HGBA1C in the last 72 hours. CBG: Recent Labs  Lab 04/16/20 0921  GLUCAP 66*   Lipid Profile: No results for input(s): CHOL, HDL, LDLCALC, TRIG, CHOLHDL, LDLDIRECT in the last 72 hours. Thyroid Function Tests: No results for input(s): TSH, T4TOTAL, FREET4, T3FREE, THYROIDAB in the last 72 hours. Anemia Panel: No results for input(s): VITAMINB12, FOLATE, FERRITIN, TIBC, IRON, RETICCTPCT in the last 72 hours. Sepsis Labs: No results for input(s): PROCALCITON, LATICACIDVEN in the last 168 hours.  Recent Results (from the past 240 hour(s))  SARS Coronavirus 2 by RT PCR (hospital order, performed in [facility] hospital lab) Nasopharyngeal Nasopharyngeal Swab     Status: None   Collection Time: 04/15/20 10:58 AM   Specimen: Nasopharyngeal Swab    Result Value Ref Range Status   SARS Coronavirus 2 NEGATIVE NEGATIVE Final    Comment: (NOTE) SARS-CoV-2 target nucleic acids are NOT DETECTED.  The SARS-CoV-2 RNA is generally detectable in upper and lower respiratory specimens during the acute phase of infection. The lowest concentration of SARS-CoV-2 viral copies this assay can detect is 250 copies / mL. A negative result does not preclude SARS-CoV-2 infection and should not be used as the sole basis for treatment or other patient management decisions.  A negative result may occur with improper specimen collection / handling,  submission of specimen other than nasopharyngeal swab, presence of viral mutation(s) within the areas targeted by this assay, and inadequate number of viral copies (<250 copies / mL). A negative result must be combined with clinical observations, patient history, and epidemiological information.  Fact Sheet for Patients:   StrictlyIdeas.no  Fact Sheet for Healthcare Providers: BankingDealers.co.za  This test is not yet approved or  cleared by the [place] FDA and has been authorized for detection and/or diagnosis of SARS-CoV-2 by FDA under an Emergency Use Authorization (EUA).  This EUA will remain in effect (meaning this test can be used) for the duration of the COVID-19 declaration under Section 564(b)(1) of the Act, 21 U.S.C. section 360bbb-3(b)(1), unless the authorization is terminated or revoked sooner.  Performed at [facility], [address], [place], [place] [address]          Radiology Studies: CT HEAD WO CONTRAST  Result Date: 04/16/2020 CLINICAL DATA:  Syncope EXAM: CT HEAD WITHOUT CONTRAST TECHNIQUE: Contiguous axial images were obtained from the base of the skull through the vertex without intravenous contrast. COMPARISON:  March 03, 2020 FINDINGS: Brain: The ventricles and sulci are normal in size and configuration.  Prominence of the cisterna magna is an anatomic variant. There is no intracranial mass, hemorrhage, extra-axial fluid collection, or midline shift. The brain parenchyma appears unremarkable. No evident acute infarct. Vascular: No hyperdense vessel. There are foci of calcification in each carotid siphon region and distal left vertebral artery. Skull: Bony calvarium appears intact. Sinuses/Orbits: Visualized paranasal sinuses are clear. Visualized orbits appear symmetric bilaterally. Other: Mastoid air cells are clear. There is calcification and mild soft tissue thickening at the site of previous mass arising from the scalp in the left anterior temporal region. IMPRESSION: Question postoperative change along the left anterior, inferior temporal scalp region with calcification. No intracranial mass, hemorrhage, or evidence to suggest acute infarct. Foci of arterial vascular calcification noted. Electronically Signed   By: Lowella Grip III M.D.   On: 04/16/2020 08:28   DG Chest Portable 1 View  Result Date: 04/15/2020 CLINICAL DATA:  End stage renal disease.  Recent missed dialysis. EXAM: PORTABLE CHEST 1 VIEW COMPARISON:  Dec 27, 2019 chest radiograph and chest CT Dec 28, 2019 FINDINGS: There is a probable nipple shadow on the right, unchanged. There is cardiomegaly with pulmonary venous hypertension. There is slight interstitial edema. There is no airspace opacity. There is aortic atherosclerosis. No adenopathy. There is postoperative change in the cervical spine. There are clips in the left axilla. IMPRESSION: Probable nipple shadow on the right; repeat study with nipple markers could confirm. Cardiomegaly with pulmonary vascular congestion and mild edema consistent with volume overload/congestive heart failure. No consolidation. Aortic Atherosclerosis (ICD10-I70.0). Electronically Signed   By: Lowella Grip III M.D.   On: 04/15/2020 12:53        Scheduled Meds: . amLODipine  5 mg Oral Daily  .  apixaban  10 mg Oral BID   Followed by  . [START ON 04/22/2020] apixaban  5 mg Oral BID  . Chlorhexidine Gluconate Cloth  6 each Topical Q0600  . doxycycline  100 mg Oral Q12H  . nicotine  21 mg Transdermal Daily  . patiromer  16.8 g Oral Daily   Continuous Infusions: . sodium chloride    . sodium chloride       LOS: 0 days    Time spent: 25 minutes    Sidney Ace, MD [facility] Pager [phone]  If 7PM-7AM,  please contact night-coverage 04/16/2020, 1:04 PM

## 2020-04-16 NOTE — ED Notes (Signed)
Pt provided orange juice per request

## 2020-04-16 NOTE — ED Notes (Signed)
Pt given sprite and crackers.  C/o nausea.  Too soon for zofran.  MD messaged for antiemetic. AMN interpretor used to assess and speak with pt.

## 2020-04-16 NOTE — ED Notes (Signed)
Pt provided lunch tray; pt declines.

## 2020-04-16 NOTE — ED Notes (Signed)
Returned from CT scan.

## 2020-04-16 NOTE — ED Notes (Signed)
PT at bedside.

## 2020-04-16 NOTE — ED Notes (Signed)
Pt complaining of 7/10 facial pain, medicated per [facility]

## 2020-04-17 LAB — PARATHYROID HORMONE, INTACT (NO CA): PTH: 1236 pg/mL — ABNORMAL HIGH (ref 15–65)

## 2020-04-17 LAB — PHOSPHORUS: Phosphorus: 3.1 mg/dL (ref 2.5–4.6)

## 2020-04-17 MED ORDER — APIXABAN 5 MG PO TABS: 5.0000 mg | ORAL_TABLET | Freq: Two times a day (BID) | ORAL | 0 refills | Status: DC

## 2020-04-17 MED ORDER — AMLODIPINE BESYLATE 5 MG PO TABS: 5.0000 mg | ORAL_TABLET | Freq: Every day | ORAL | 0 refills | Status: DC

## 2020-04-17 MED ORDER — FERRIC CITRATE 1 GM 210 MG(FE) PO TABS
420.0000 mg | ORAL_TABLET | Freq: Three times a day (TID) | ORAL | Status: DC
  Administered 2020-04-17: 420 mg via ORAL
  Filled 2020-04-17 (×3): qty 2

## 2020-04-17 MED ORDER — DOXYCYCLINE HYCLATE 100 MG PO TABS: 100.0000 mg | ORAL_TABLET | Freq: Two times a day (BID) | ORAL | 0 refills | Status: AC

## 2020-04-17 MED ORDER — GABAPENTIN 100 MG PO CAPS: 100.0000 mg | ORAL_CAPSULE | Freq: Three times a day (TID) | ORAL | 2 refills | Status: DC | PRN

## 2020-04-17 NOTE — Plan of Care (Signed)
Discharge order received. Patient mental status is at baseline. Vital signs stable . No signs of acute distress. Discharge instructions given to patient with the used of interpreter device. Patient verbalized understanding. No other issues noted at this time. Patient is dc'd with medication from [facility].

## 2020-04-17 NOTE — TOC Transition Note (Signed)
Transition of Care [facility]) - CM/SW Discharge Note   Patient Details  Name: Mario [name] MRN: [id number] Date of Birth: 1971-05-05  Transition of [facility]) CM/SW Contact:  Candie Chroman, LCSW Phone Number: 04/17/2020, 4:06 PM   Clinical Narrative:  Patient has orders to discharge home today. Medications delivered to patient. No further concerns. CSW signing off.   Final next level of care: Home/Self Care Barriers to Discharge: No Barriers Identified   Patient Goals and CMS Choice Patient states their goals for this hospitalization and ongoing recovery are:: "I want to be transfered to another hospital to get this cancerous tumor out of my head." CMS Medicare.gov Compare Post Acute Care list provided to:: Patient Choice offered to / list presented to : Patient  Discharge Placement                    Patient and family notified of of transfer: 04/17/20  Discharge Plan and Services In-house Referral: Clinical Social Work                                   Social Determinants of Health ([place]) Interventions     Readmission Risk Interventions Readmission Risk Prevention Plan 12/30/2019  Transportation Screening Complete  PCP or Specialist Appt within 3-5 Days Patient refused  Social Work Consult for [place] Planning/Counseling Complete  Palliative Care Screening Not Applicable  Medication Review Press photographer) Complete  Some recent data might be hidden

## 2020-04-17 NOTE — TOC Progression Note (Addendum)
Transition of Care [facility]) - Progression Note    Patient Details  Name: Kofi Murrell MRN: [id number] Date of Birth: 1971-05-20  Transition of Care [facility]) CM/SW [place], LCSW Phone Number: 04/17/2020, 11:51 AM  Clinical Narrative:   Left message for Jenn at [facility].  1:50 pm: No call back from [place] yet. CSW called [facility] again and left a voicemail. MD sent prescriptions to [facility].  2:52 pm: Received call back from [place]. Patient can walk-in at [facility] any Thursday from 9:00-11:00 to complete paperwork with staff member, Santiago Glad. Santiago Glad speaks [place]. CSW used interpreter Vaughan Basta [phone]) to provide patient with this information. Will send him home with address and phone number for [facility] as requested. Let him know that this CSW will pick up his medications at [facility] around 3:30. Patient stated as long as he leaves by 6:00, he will take the bus home.  Expected Discharge Plan: Home/Self Care Barriers to Discharge: Continued Medical Work up, Inadequate or no insurance, Undocumented, Transportation, Waiting for outpatient dialysis  Expected Discharge Plan and Services Expected Discharge Plan: Home/Self Care In-house Referral: Clinical Social Work     Living arrangements for the past 2 months: Mobile Home                                       Social Determinants of Health (SDOH) Interventions    Readmission Risk Interventions Readmission Risk Prevention Plan 12/30/2019  Transportation Screening Complete  PCP or Specialist Appt within 3-5 Days Patient refused  Social Work Consult for [place] Planning/Counseling Complete  Palliative Care Screening Not Applicable  Medication Review Press photographer) Complete  Some recent data might be hidden

## 2020-04-17 NOTE — Progress Notes (Signed)
[facility]  ROUNDING NOTE   Subjective:  Patient still complaining of pain on the left temporal mass. Interview conducted with aid of Spanish interpreter. Due for dialysis treatment today.   Objective:  Vital signs in last 24 hours:  Temp:  [97.9 F (36.6 C)-98.7 F (37.1 C)] 98.7 F (37.1 C) (09/14 0501) Pulse Rate:  [65-83] 69 (09/14 0501) Resp:  [13-22] 16 (09/14 0501) BP: (139-181)/(71-89) 173/81 (09/14 0501) SpO2:  [91 %-100 %] 100 % (09/14 0501)  Weight change:  Filed Weights   04/15/20 0903  Weight: 72.6 kg    Intake/Output: I/O last 3 completed shifts: In: 500 [IV Piggyback:500] Out: 6644 [Other:1615]   Intake/Output this shift:  No intake/output data recorded.  Physical Exam: General:  No acute distress  Head:  Left temporal mass, tender  Eyes:  Anicteric  Neck:  Supple  Lungs:   Clear to auscultation, normal effort  Heart:  S1S2 no rubs  Abdomen:   Soft, nontender, bowel sounds present  Extremities:  Trace peripheral edema.  Neurologic:  Awake, alert, following commands  Skin:  No lesions  Access:  Left upper extremity AV access    Basic Metabolic Panel: Recent Labs  Lab 04/15/20 0904 04/15/20 1504 04/16/20 0427  NA 134*  --  136  K 6.4*  --  5.6*  CL 91*  --  95*  CO2 24  --  23  GLUCOSE 82  --  66*  BUN 66*  --  29*  CREATININE 11.82*  --  7.18*  CALCIUM 7.0*  --  7.5*  PHOS  --  8.3*  --     Liver Function Tests: Recent Labs  Lab 04/15/20 0904  AST 26  ALT 17  ALKPHOS 164*  BILITOT 1.2  PROT 7.2  ALBUMIN 3.7   Recent Labs  Lab 04/15/20 0904  LIPASE 34   Recent Labs  Lab 04/15/20 1504  AMMONIA 41*    CBC: Recent Labs  Lab 04/15/20 0904 04/16/20 0427  WBC 4.9 5.4  HGB 11.8* 12.1*  HCT 35.0* 35.2*  MCV 100.9* 98.9  PLT 91* 95*    Cardiac Enzymes: No results for input(s): CKTOTAL, CKMB, CKMBINDEX, TROPONINI in the last 168 hours.  BNP: Invalid input(s): POCBNP  CBG: Recent Labs  Lab  04/16/20 0921 04/16/20 2109  GLUCAP 66* 95    Microbiology: Results for orders placed or performed during the hospital encounter of 04/15/20  SARS Coronavirus 2 by RT PCR (hospital order, performed in [facility] hospital lab) Nasopharyngeal Nasopharyngeal Swab     Status: None   Collection Time: 04/15/20 10:58 AM   Specimen: Nasopharyngeal Swab  Result Value Ref Range Status   SARS Coronavirus 2 NEGATIVE NEGATIVE Final    Comment: (NOTE) SARS-CoV-2 target nucleic acids are NOT DETECTED.  The SARS-CoV-2 RNA is generally detectable in upper and lower respiratory specimens during the acute phase of infection. The lowest concentration of SARS-CoV-2 viral copies this assay can detect is 250 copies / mL. A negative result does not preclude SARS-CoV-2 infection and should not be used as the sole basis for treatment or other patient management decisions.  A negative result may occur with improper specimen collection / handling, submission of specimen other than nasopharyngeal swab, presence of viral mutation(s) within the areas targeted by this assay, and inadequate number of viral copies (<250 copies / mL). A negative result must be combined with clinical observations, patient history, and epidemiological information.  Fact Sheet for Patients:   StrictlyIdeas.no  Fact Sheet for Healthcare Providers: BankingDealers.co.za  This test is not yet approved or  cleared by the [place] FDA and has been authorized for detection and/or diagnosis of SARS-CoV-2 by FDA under an Emergency Use Authorization (EUA).  This EUA will remain in effect (meaning this test can be used) for the duration of the COVID-19 declaration under Section 564(b)(1) of the Act, 21 U.S.C. section 360bbb-3(b)(1), unless the authorization is terminated or revoked sooner.  Performed at [facility], [address] [place], [place] [address]      Coagulation Studies: Recent Labs    04/15/20 1504  LABPROT 15.2  INR 1.3*    Urinalysis: No results for input(s): COLORURINE, LABSPEC, PHURINE, GLUCOSEU, HGBUR, BILIRUBINUR, KETONESUR, PROTEINUR, UROBILINOGEN, NITRITE, LEUKOCYTESUR in the last 72 hours.  Invalid input(s): APPERANCEUR    Imaging: CT HEAD WO CONTRAST  Result Date: 04/16/2020 CLINICAL DATA:  Syncope EXAM: CT HEAD WITHOUT CONTRAST TECHNIQUE: Contiguous axial images were obtained from the base of the skull through the vertex without intravenous contrast. COMPARISON:  March 03, 2020 FINDINGS: Brain: The ventricles and sulci are normal in size and configuration. Prominence of the cisterna magna is an anatomic variant. There is no intracranial mass, hemorrhage, extra-axial fluid collection, or midline shift. The brain parenchyma appears unremarkable. No evident acute infarct. Vascular: No hyperdense vessel. There are foci of calcification in each carotid siphon region and distal left vertebral artery. Skull: Bony calvarium appears intact. Sinuses/Orbits: Visualized paranasal sinuses are clear. Visualized orbits appear symmetric bilaterally. Other: Mastoid air cells are clear. There is calcification and mild soft tissue thickening at the site of previous mass arising from the scalp in the left anterior temporal region. IMPRESSION: Question postoperative change along the left anterior, inferior temporal scalp region with calcification. No intracranial mass, hemorrhage, or evidence to suggest acute infarct. Foci of arterial vascular calcification noted. Electronically Signed   By: Lowella Grip III M.D.   On: 04/16/2020 08:28   DG Chest Portable 1 View  Result Date: 04/15/2020 CLINICAL DATA:  End stage renal disease.  Recent missed dialysis. EXAM: PORTABLE CHEST 1 VIEW COMPARISON:  Dec 27, 2019 chest radiograph and chest CT Dec 28, 2019 FINDINGS: There is a probable nipple shadow on the right, unchanged. There is cardiomegaly with  pulmonary venous hypertension. There is slight interstitial edema. There is no airspace opacity. There is aortic atherosclerosis. No adenopathy. There is postoperative change in the cervical spine. There are clips in the left axilla. IMPRESSION: Probable nipple shadow on the right; repeat study with nipple markers could confirm. Cardiomegaly with pulmonary vascular congestion and mild edema consistent with volume overload/congestive heart failure. No consolidation. Aortic Atherosclerosis (ICD10-I70.0). Electronically Signed   By: Lowella Grip III M.D.   On: 04/15/2020 12:53     Medications:   . sodium chloride    . sodium chloride     . amLODipine  5 mg Oral Daily  . apixaban  5 mg Oral BID  . Chlorhexidine Gluconate Cloth  6 each Topical Q0600  . doxycycline  100 mg Oral Q12H  . ferric citrate  420 mg Oral TID WC  . nicotine  21 mg Transdermal Daily  . patiromer  16.8 g Oral Daily   sodium chloride, sodium chloride, acetaminophen, alteplase, atropine, heparin, hydrALAZINE, lidocaine (PF), lidocaine-prilocaine, morphine injection, ondansetron (ZOFRAN) IV, oxyCODONE-acetaminophen, pentafluoroprop-tetrafluoroeth  Assessment/ Plan:  49 y.o. adult with past medical history of ESRD on HD TTS, chronic diastolic heart failure, cirrhosis of liver, hypertension, tobacco abuse, anemia of  chronic disease, secondary hyperparathyroidism, splenic infarct who presents now with left temporal mass and missed dialysis treatment and hyperkalemia.  [facility]/TTS/[facility] neph[facility]  1.  ESRD on HD TTS/hyperkalemia.  Patient due for dialysis treatment today.  We have prepared orders.  Dialyze patient against a 2K bath.  2.  Anemia of chronic kidney disease.  Hemoglobin currently 12.1.  No indication for Epogen at this time.  3.  Secondary hyperparathyroidism.  Phosphorus very high at 8.3 at last check.  Currently maintained on Auryxia 420 mg p.o. 3 times daily.  Recheck serum phosphorus  today.   LOS: 1 Mario [name] 9/14/20219:36 AM

## 2020-04-17 NOTE — Discharge Summary (Signed)
Physician Discharge Summary  Mario [name]:[id number] DOB: Oct 15, 1970 DOA: 04/15/2020  PCP: Mario Legato, MD  Admit date: 04/15/2020 Discharge date: 04/17/2020  Admitted From: Home Disposition: Home  Recommendations for Outpatient Follow-up:  1. Follow up with PCP in 1-2 weeks 2. Request referral to dermatology  Home Health: No Equipment/Devices: None Discharge Condition: Stable CODE STATUS: Full Diet recommendation: Low-sodium Brief/Interim Summary: ERX:VQMG Mario Marlowe Sax Anzorais a 49 y.o.adultwith medical history significant ofESRD-HD (TTS),hypertension, mitral valve regurgitation, cirrhosis,dCHF, anemia, tobacco abuse, splenic infarction, who presents with mass pain in left temple area.  Pt has a small mass in left temple area forseveral months. Patient was seen in ED on 03/03/20. Dr. Ellender [name] tried to drain this lesion. Per his clinic note, it is concerned forpossibility of a skin cancer, withconsideration of infected sebaceous cyst as well.Pt was givena course of doxycycline treatment.He was givenurgent referrals for dermatology, but pt did not f/u withdermatologist.Pt states that he has severe pain in that area with little fluid draining.Denies fever or chills.  Patient reported to ED physician that he has nausea and abdominal pain, but he denies abdominal pain to me. Patient hasno vomiting or diarrhea. Denies chest pain, shortness breath, cough. No unilateral weakness. Patient missed dialysis on Saturday.  While waiting in ED for admission, patient had developed 1 episode of near syncope. Patient suddenly become diaphoretic,with bradycardia heart rate 40 to 50s.No facial droop or slurred speech.Patient moves all extremities normally.  9/13: Patient seen and examined.  History conducted in [place].  Patient frustrated about continued pain from his left temporal lesion.  I had a lengthy conversation with him about his need to follow-up  with dermatologist or least an outpatient primary care doctor as cutting into the mask could potentially cause extension of an underlying dermatologic malignancy.  This was difficult to communicate to given the language barrier.  Patient potassium has improved since emergent dialysis yesterday.  Case discussed with nephrology.  We will plan on dialysis tomorrow 04/17/2020 and preparation for discharge  Had a lengthy conversation with the [facility] representative Mario [name] about this patient.  She spoke to them over the phone and stated that he requested to be transferred to another hospital.  Unfortunately at this time there is no justification for hospital hospital transfer and given the scarcity of beds due to the COVID-19 pandemic we are unable to transfer at this time  9/14: Patient seen and examined.  Hemodynamically stable.  Long conversation with the patient today.  Used video interpreter to ensure clarity of message.  I clearly explained to the patient that the lesion on his temple likely representing underlying skin cancer in order to have it further worked up and potentially removed he would need to see an outpatient provider and get a biopsy to determine etiology of the mass.  Do not recommend I&D at this time.  Patient expressed understanding of this.  In regards outpatient follow-up this likely would be a challenge.  The patient is uninsured, undocumented, illiterate and does not have a job or any reliable income.  His place of living is also in question.  After lengthy discussion with bedside nurse and case management the best we can do is [facility].  Patient is instructed to follow-up there the day after discharge.  There will be someone there that speak Spanish that can help guide him through the initial paperwork.  As far as treatment goes because the patient is uninsured he has no money to pick up his  medications.  As such all medications will be sent to medication management  clinic.  He did receive hemodialysis on 04/17/2020 successfully.  Discharge Diagnoses:  Principal Problem:   Hyperkalemia Active Problems:   ESRD (end stage renal disease) on dialysis (HCC)   Chronic diastolic (congestive) heart failure (HCC)   Cirrhosis (HCC)   HTN (hypertension)   Tobacco abuse   Anemia in ESRD (end-stage renal disease) ([place])   Splenic infarction   Soft tissue mass in left temple area   Near syncope  Hyperkalemia: Dialysis x2 in house.  Improvement in serum potassium. Resume TTS dialysis schedule  ESRD (end stage renal disease) on dialysis (TTS) Dialysis x2 in house Resume TTS dialysis schedule  Soft tissue mass in left temple area: Etiology is not clear. Concerning for carcinoma. -Patient was seen in the emergency room on 03/03/2020 for the same complaint.  At that time he had an incision and drainage done in the emergency room.  Small amount of blood in pus liberated from the mass.  Patient was given doxycycline at that time but his adherence to this medication is unclear -Patient states that the mass is painful and is preventing him from sleeping.  Numerous providers at this time of educated the patient on the importance of establishing outpatient follow-up.  However after discussion with case management it appears that the patient is undocumented, uninsured, illiterate, without transportation, without job, without money and has no ability to actually get into see outpatient providers -He requested transfer to another hospital.  We explained to him that hospital hospital transfer is not indicated in this circumstance.  Also we have very severe bed and staffing shortage right now due to the COVID-19 pandemic and we could not accommodate for hospital hospital transfer at this time -[place] is working on a outpatient follow-up with [facility].  Patient will show up at [facility] today after discharge between 9 and 11.  There  will be a Spanish-speaking interpreter present.  They will help with the paperwork.  Chronic diastolic (congestive) heart failure (HCC):2D echo on 12/29/2019 showed EF 55 to 60%,no leg edema or JVD. CHF is compensated.   Cirrhosis (HCC):Mental status normal -Check INR, PTT: Within normal limits -Check ammonia level: Within normal limits  HTN (hypertension):Patient is not taking medications at home.Blood pressure is elevated to 178/77 -IV hydralazine as needed -Start amlodipine 5 mg mg daily, prescribed on discharge  Tobacco abuse -Nicotine patch  Anemia in ESRD (end-stage renal disease) ([place]) -Continue iron supplement  Splenic infarction: Patient is supposed to take Eliquis, but not taking his medications currently -Start Eliquis, prescribed on discharge.  1 month supply.  We will need to see outpatient clinic for refills  Near syncope:Possibly due to vasovagal syncope versus bradycardia secondary to hyperkalemia. Patient does not have focal neurodeficit on physical examination.Since pt will be restarted on Eliquis, will get CT-head without contrast -f/u CT-head: negative   Discharge Instructions  Discharge Instructions    Diet - low sodium heart healthy   Complete by: As directed    Increase activity slowly   Complete by: As directed    No wound care   Complete by: As directed      Allergies as of 04/17/2020      Reactions   Betadine [povidone Iodine] Rash      Medication List    TAKE these medications   acetaminophen 500 MG tablet Commonly known as: TYLENOL Take 500-1,000 mg by mouth every 6 (six) hours  as needed for mild pain or fever.   amLODipine 5 MG tablet Commonly known as: NORVASC Take 1 tablet (5 mg total) by mouth daily.   apixaban 5 MG Tabs tablet Commonly known as: ELIQUIS Take 1 tablet (5 mg total) by mouth 2 (two) times daily.   Auryxia 1 GM 210 MG(Fe) tablet Generic drug: ferric citrate Take 420 mg by mouth 3 (three) times  daily with meals.   doxycycline 100 MG tablet Commonly known as: VIBRA-TABS Take 1 tablet (100 mg total) by mouth every 12 (twelve) hours for 7 days.   gabapentin 100 MG capsule Commonly known as: Neurontin Take 1 capsule (100 mg total) by mouth 3 (three) times daily as needed.       Allergies  Allergen Reactions  . Betadine [Povidone Iodine] Rash    Consultations:  Nephrology-[facility]   Procedures/Studies: CT HEAD WO CONTRAST  Result Date: 04/16/2020 CLINICAL DATA:  Syncope EXAM: CT HEAD WITHOUT CONTRAST TECHNIQUE: Contiguous axial images were obtained from the base of the skull through the vertex without intravenous contrast. COMPARISON:  March 03, 2020 FINDINGS: Brain: The ventricles and sulci are normal in size and configuration. Prominence of the cisterna magna is an anatomic variant. There is no intracranial mass, hemorrhage, extra-axial fluid collection, or midline shift. The brain parenchyma appears unremarkable. No evident acute infarct. Vascular: No hyperdense vessel. There are foci of calcification in each carotid siphon region and distal left vertebral artery. Skull: Bony calvarium appears intact. Sinuses/Orbits: Visualized paranasal sinuses are clear. Visualized orbits appear symmetric bilaterally. Other: Mastoid air cells are clear. There is calcification and mild soft tissue thickening at the site of previous mass arising from the scalp in the left anterior temporal region. IMPRESSION: Question postoperative change along the left anterior, inferior temporal scalp region with calcification. No intracranial mass, hemorrhage, or evidence to suggest acute infarct. Foci of arterial vascular calcification noted. Electronically Signed   By: Lowella Grip III M.D.   On: 04/16/2020 08:28   DG Chest Portable 1 View  Result Date: 04/15/2020 CLINICAL DATA:  End stage renal disease.  Recent missed dialysis. EXAM: PORTABLE CHEST 1 VIEW COMPARISON:  Dec 27, 2019 chest  radiograph and chest CT Dec 28, 2019 FINDINGS: There is a probable nipple shadow on the right, unchanged. There is cardiomegaly with pulmonary venous hypertension. There is slight interstitial edema. There is no airspace opacity. There is aortic atherosclerosis. No adenopathy. There is postoperative change in the cervical spine. There are clips in the left axilla. IMPRESSION: Probable nipple shadow on the right; repeat study with nipple markers could confirm. Cardiomegaly with pulmonary vascular congestion and mild edema consistent with volume overload/congestive heart failure. No consolidation. Aortic Atherosclerosis (ICD10-I70.0). Electronically Signed   By: Lowella Grip III M.D.   On: 04/15/2020 12:53    (Echo, Carotid, EGD, Colonoscopy, ERCP)    Subjective: Seen and examined the day of discharge.  History obtained with Spanish video interpreter.  All questions answered.  Patient underwent dialysis today and discharged home afterward.  Discharge Exam: Vitals:   04/17/20 1245 04/17/20 1300  BP: (!) 162/62 (!) 168/75  Pulse: 68 67  Resp: 12 15  Temp:    SpO2: 100% 100%   Vitals:   04/17/20 1215 04/17/20 1230 04/17/20 1245 04/17/20 1300  BP: (!) 168/63 (!) 157/63 (!) 162/62 (!) 168/75  Pulse: 68 70 68 67  Resp: 15 14 12 15   Temp:      TempSrc:      SpO2: 100%  100% 100% 100%  Weight:      Height:        General: Pt is alert, awake, not in acute distress Cardiovascular: RRR, S1/S2 +, no rubs, no gallops Respiratory: CTA bilaterally, no wheezing, no rhonchi Abdominal: Soft, NT, ND, bowel sounds + Extremities: no edema, no cyanosis left upper extremity AV fistula    The results of significant diagnostics from this hospitalization (including imaging, microbiology, ancillary and laboratory) are listed below for reference.     Microbiology: Recent Results (from the past 240 hour(s))  SARS Coronavirus 2 by RT PCR (hospital order, performed in [facility] hospital lab)  Nasopharyngeal Nasopharyngeal Swab     Status: None   Collection Time: 04/15/20 10:58 AM   Specimen: Nasopharyngeal Swab  Result Value Ref Range Status   SARS Coronavirus 2 NEGATIVE NEGATIVE Final    Comment: (NOTE) SARS-CoV-2 target nucleic acids are NOT DETECTED.  The SARS-CoV-2 RNA is generally detectable in upper and lower respiratory specimens during the acute phase of infection. The lowest concentration of SARS-CoV-2 viral copies this assay can detect is 250 copies / mL. A negative result does not preclude SARS-CoV-2 infection and should not be used as the sole basis for treatment or other patient management decisions.  A negative result may occur with improper specimen collection / handling, submission of specimen other than nasopharyngeal swab, presence of viral mutation(s) within the areas targeted by this assay, and inadequate number of viral copies (<250 copies / mL). A negative result must be combined with clinical observations, patient history, and epidemiological information.  Fact Sheet for Patients:   StrictlyIdeas.no  Fact Sheet for Healthcare Providers: BankingDealers.co.za  This test is not yet approved or  cleared by the [place] FDA and has been authorized for detection and/or diagnosis of SARS-CoV-2 by FDA under an Emergency Use Authorization (EUA).  This EUA will remain in effect (meaning this test can be used) for the duration of the COVID-19 declaration under Section 564(b)(1) of the Act, 21 U.S.C. section 360bbb-3(b)(1), unless the authorization is terminated or revoked sooner.  Performed at [facility], [address] [place], [place] [address]      Labs: BNP (last 3 results) No results for input(s): BNP in the last 8760 hours. Basic Metabolic Panel: Recent Labs  Lab 04/15/20 0904 04/15/20 1504 04/16/20 0427 04/17/20 1235  NA 134*  --  136  --   K 6.4*  --  5.6*  --   CL 91*   --  95*  --   CO2 24  --  23  --   GLUCOSE 82  --  66*  --   BUN 66*  --  29*  --   CREATININE 11.82*  --  7.18*  --   CALCIUM 7.0*  --  7.5*  --   PHOS  --  8.3*  --  3.1   Liver Function Tests: Recent Labs  Lab 04/15/20 0904  AST 26  ALT 17  ALKPHOS 164*  BILITOT 1.2  PROT 7.2  ALBUMIN 3.7   Recent Labs  Lab 04/15/20 0904  LIPASE 34   Recent Labs  Lab 04/15/20 1504  AMMONIA 41*   CBC: Recent Labs  Lab 04/15/20 0904 04/16/20 0427  WBC 4.9 5.4  HGB 11.8* 12.1*  HCT 35.0* 35.2*  MCV 100.9* 98.9  PLT 91* 95*   Cardiac Enzymes: No results for input(s): CKTOTAL, CKMB, CKMBINDEX, TROPONINI in the last 168 hours. BNP: Invalid input(s): POCBNP CBG: Recent Labs  Lab 04/16/20 0921 04/16/20 2109  GLUCAP 66* 95   D-Dimer No results for input(s): DDIMER in the last 72 hours. Hgb A1c No results for input(s): HGBA1C in the last 72 hours. Lipid Profile No results for input(s): CHOL, HDL, LDLCALC, TRIG, CHOLHDL, LDLDIRECT in the last 72 hours. Thyroid function studies No results for input(s): TSH, T4TOTAL, T3FREE, THYROIDAB in the last 72 hours.  Invalid input(s): FREET3 Anemia work up No results for input(s): VITAMINB12, FOLATE, FERRITIN, TIBC, IRON, RETICCTPCT in the last 72 hours. Urinalysis No results found for: COLORURINE, APPEARANCEUR, [place], [place], [place], [place], [place], [place], PROTEINUR, UROBILINOGEN, NITRITE, LEUKOCYTESUR Sepsis Labs Invalid input(s): PROCALCITONIN,  WBC,  LACTICIDVEN Microbiology Recent Results (from the past 240 hour(s))  SARS Coronavirus 2 by RT PCR (hospital order, performed in [facility] hospital lab) Nasopharyngeal Nasopharyngeal Swab     Status: None   Collection Time: 04/15/20 10:58 AM   Specimen: Nasopharyngeal Swab  Result Value Ref Range Status   SARS Coronavirus 2 NEGATIVE NEGATIVE Final    Comment: (NOTE) SARS-CoV-2 target nucleic acids are NOT DETECTED.  The SARS-CoV-2 RNA is generally detectable in  upper and lower respiratory specimens during the acute phase of infection. The lowest concentration of SARS-CoV-2 viral copies this assay can detect is 250 copies / mL. A negative result does not preclude SARS-CoV-2 infection and should not be used as the sole basis for treatment or other patient management decisions.  A negative result may occur with improper specimen collection / handling, submission of specimen other than nasopharyngeal swab, presence of viral mutation(s) within the areas targeted by this assay, and inadequate number of viral copies (<250 copies / mL). A negative result must be combined with clinical observations, patient history, and epidemiological information.  Fact Sheet for Patients:   StrictlyIdeas.no  Fact Sheet for Healthcare Providers: BankingDealers.co.za  This test is not yet approved or  cleared by the [place] FDA and has been authorized for detection and/or diagnosis of SARS-CoV-2 by FDA under an Emergency Use Authorization (EUA).  This EUA will remain in effect (meaning this test can be used) for the duration of the COVID-19 declaration under Section 564(b)(1) of the Act, 21 U.S.C. section 360bbb-3(b)(1), unless the authorization is terminated or revoked sooner.  Performed at [facility], [address], [place], [place] [address]      Time coordinating discharge: Over 30 minutes  SIGNED:   Sidney Ace, MD  [facility] 04/17/2020, 1:47 PM Pager   If 7PM-7AM, please contact night-coverage

## 2020-05-03 ENCOUNTER — Encounter: Payer: Self-pay | Admitting: Gerontology

## 2020-05-03 ENCOUNTER — Ambulatory Visit: Payer: Self-pay | Admitting: Gerontology

## 2020-05-03 ENCOUNTER — Other Ambulatory Visit: Payer: Self-pay

## 2020-05-03 DIAGNOSIS — Z7689 Persons encountering health services in other specified circumstances: Secondary | ICD-10-CM | POA: Insufficient documentation

## 2020-05-03 NOTE — Progress Notes (Signed)
Patient ID: Mario [name], adult   DOB: 05-17-1971, 49 y.o.   MRN: [id number]  No chief complaint on file.   HPI Mario [name] Coma is a 49 y.o. adult to establish care and evaluation of his chronic conditions. He was discharged from the hospital on 04/17/2020. He has a lesion on his temple and right chin, was discharged with Doxycycline and  Dermatology referral was initiated. He declined his Dermatology referral in July because he didn't have a way to get to the clinic. He has a history of ESRD and on HD TTS. He has been on Dialysis for 10 years and goes to [place]. He states that he was referred to [facility] to take care of his left temple skin lesion. He verbalized that since we are not going to take care of his Dermatologic needs that he's leaving. He left the clinic against advise and visit was terminated.    Past Medical History:  Diagnosis Date   Abdominal pain 08/28/2016   Acute hyperkalemia 10/28/2016   Chest pain 04/11/2016   Chronic combined systolic and diastolic CHF (congestive heart failure) ([place])    Cirrhosis ([place])    Dialysis patient ([place])    ESRD (end stage renal disease) on dialysis ([place]) 01/25/2015   Fluid overload 04/12/2016   HTN (hypertension) 10/13/2016   Hyperkalemia 11/27/2016   Hypertension    Mitral regurgitation    Pulmonary edema 07/03/2016   Renal disorder    Renal insufficiency    Scrotal infection 01/25/2015    Past Surgical History:  Procedure Laterality Date   ANTERIOR CERVICAL CORPECTOMY N/A 04/05/2019   Procedure: ANTERIOR CERVICAL CORPECTOMY Cervical six and Cervical seven with strut graft fusion from Cervical five-Thoracic one   ;  Surgeon: Earnie Larsson, MD;  Location: [facility];  Service: Neurosurgery;  Laterality: N/A;   AV FISTULA PLACEMENT     POSTERIOR CERVICAL FUSION/FORAMINOTOMY N/A 04/05/2019   Procedure: Posterior cervical fusion utilizing segmental instrumentation from Cervical five-Thoracic one ;  Surgeon: Earnie Larsson, MD;   Location: [facility];  Service: Neurosurgery;  Laterality: N/A;    Family History  Problem Relation Age of Onset   Kidney failure Father    Diabetes Mother     Social History Social History   Tobacco Use   Smoking status: Current Every Day Smoker    Packs/day: 0.25    Years: 25.00    Pack years: 6.25    Types: Cigarettes   Smokeless tobacco: Never Used  Substance Use Topics   Alcohol use: No    Comment: pt denies   Drug use: No    Allergies  Allergen Reactions   Betadine [Povidone Iodine] Rash    Current Outpatient Medications  Medication Sig Dispense Refill   acetaminophen (TYLENOL) 500 MG tablet Take 500-1,000 mg by mouth every 6 (six) hours as needed for mild pain or fever.      amLODipine (NORVASC) 5 MG tablet Take 1 tablet (5 mg total) by mouth daily. 30 tablet 0   apixaban (ELIQUIS) 5 MG TABS tablet Take 1 tablet (5 mg total) by mouth 2 (two) times daily. 60 tablet 0   ferric citrate (AURYXIA) 1 GM 210 MG(Fe) tablet Take 420 mg by mouth 3 (three) times daily with meals.      gabapentin (NEURONTIN) 100 MG capsule Take 1 capsule (100 mg total) by mouth 3 (three) times daily as needed. 20 capsule 2   No current facility-administered medications for this visit.    Review of Systems Review of  Systems  There were no vitals taken for this visit.  Physical Exam Physical Exam  Data Reviewed Left AMA  Assessment and Plan  Left AMA   Avigdor Dollar E Luceal [place] 05/03/2020, 9:12 AM

## 2020-06-27 ENCOUNTER — Telehealth: Payer: Self-pay | Admitting: Pharmacist

## 2020-06-27 NOTE — Telephone Encounter (Signed)
Patient failed to provide requested 2021 financial documentation. No additional medication assistance will be provided by [facility] without the required proof of income documentation. Patient notified by letter Debra Cheek Administrative Assistant [facility] 

## 2021-02-19 ENCOUNTER — Other Ambulatory Visit: Payer: Self-pay

## 2021-02-19 ENCOUNTER — Emergency Department
Admission: EM | Admit: 2021-02-19 | Discharge: 2021-02-19 | Disposition: A | Discharge status: Home / Self Care | Payer: Self-pay | Attending: Emergency Medicine | Admitting: Emergency Medicine

## 2021-02-19 DIAGNOSIS — H539 Unspecified visual disturbance: Secondary | ICD-10-CM | POA: Insufficient documentation

## 2021-02-19 DIAGNOSIS — F17210 Nicotine dependence, cigarettes, uncomplicated: Secondary | ICD-10-CM | POA: Insufficient documentation

## 2021-02-19 DIAGNOSIS — I5042 Chronic combined systolic (congestive) and diastolic (congestive) heart failure: Secondary | ICD-10-CM | POA: Insufficient documentation

## 2021-02-19 DIAGNOSIS — Z992 Dependence on renal dialysis: Secondary | ICD-10-CM | POA: Insufficient documentation

## 2021-02-19 DIAGNOSIS — I132 Hypertensive heart and chronic kidney disease with heart failure and with stage 5 chronic kidney disease, or end stage renal disease: Secondary | ICD-10-CM | POA: Insufficient documentation

## 2021-02-19 DIAGNOSIS — R188 Other ascites: Secondary | ICD-10-CM | POA: Insufficient documentation

## 2021-02-19 DIAGNOSIS — Z79899 Other long term (current) drug therapy: Secondary | ICD-10-CM | POA: Insufficient documentation

## 2021-02-19 DIAGNOSIS — Z7901 Long term (current) use of anticoagulants: Secondary | ICD-10-CM | POA: Insufficient documentation

## 2021-02-19 DIAGNOSIS — N186 End stage renal disease: Secondary | ICD-10-CM | POA: Insufficient documentation

## 2021-02-19 LAB — CBC WITH DIFFERENTIAL/PLATELET
Abs Immature Granulocytes: 0.01 10*3/uL (ref 0.00–0.07)
Basophils Absolute: 0.1 10*3/uL (ref 0.0–0.1)
Basophils Relative: 1 %
Eosinophils Absolute: 0.2 10*3/uL (ref 0.0–0.5)
Eosinophils Relative: 4 %
HCT: 40.7 % (ref 39.0–52.0)
Hemoglobin: 13.2 g/dL (ref 13.0–17.0)
Immature Granulocytes: 0 %
Lymphocytes Relative: 15 %
Lymphs Abs: 0.6 10*3/uL — ABNORMAL LOW (ref 0.7–4.0)
MCH: 33.9 pg (ref 26.0–34.0)
MCHC: 32.4 g/dL (ref 30.0–36.0)
MCV: 104.6 fL — ABNORMAL HIGH (ref 80.0–100.0)
Monocytes Absolute: 0.5 10*3/uL (ref 0.1–1.0)
Monocytes Relative: 11 %
Neutro Abs: 2.9 10*3/uL (ref 1.7–7.7)
Neutrophils Relative %: 69 %
Platelets: 95 10*3/uL — ABNORMAL LOW (ref 150–400)
RBC: 3.89 MIL/uL — ABNORMAL LOW (ref 4.22–5.81)
RDW: 14.2 % (ref 11.5–15.5)
WBC: 4.3 10*3/uL (ref 4.0–10.5)
nRBC: 0 % (ref 0.0–0.2)

## 2021-02-19 LAB — COMPREHENSIVE METABOLIC PANEL
ALT: 13 U/L (ref 0–44)
AST: 26 U/L (ref 15–41)
Albumin: 3.7 g/dL (ref 3.5–5.0)
Alkaline Phosphatase: 216 U/L — ABNORMAL HIGH (ref 38–126)
Anion gap: 13 (ref 5–15)
BUN: 36 mg/dL — ABNORMAL HIGH (ref 6–20)
CO2: 31 mmol/L (ref 22–32)
Calcium: 8.1 mg/dL — ABNORMAL LOW (ref 8.9–10.3)
Chloride: 96 mmol/L — ABNORMAL LOW (ref 98–111)
Creatinine, Ser: 7.78 mg/dL — ABNORMAL HIGH (ref 0.61–1.24)
GFR, Estimated: 8 mL/min — ABNORMAL LOW (ref >60)
Glucose, Bld: 97 mg/dL (ref 70–99)
Potassium: 4.8 mmol/L (ref 3.5–5.1)
Sodium: 140 mmol/L (ref 135–145)
Total Bilirubin: 1.1 mg/dL (ref 0.3–1.2)
Total Protein: 7.1 g/dL (ref 6.5–8.1)

## 2021-02-19 LAB — LIPASE, BLOOD: Lipase: 82 U/L — ABNORMAL HIGH (ref 11–51)

## 2021-02-19 LAB — SEDIMENTATION RATE: Sed Rate: 11 mm/hr (ref 0–20)

## 2021-02-19 MED ORDER — TETRACAINE HCL 0.5 % OP SOLN
1.0000 [drp] | Freq: Once | OPHTHALMIC | Status: AC
  Administered 2021-02-19: 1 [drp] via OPHTHALMIC
  Filled 2021-02-19: qty 4

## 2021-02-19 MED ORDER — FLUORESCEIN SODIUM 1 MG OP STRP
1.0000 | ORAL_STRIP | Freq: Once | OPHTHALMIC | Status: AC
  Administered 2021-02-19: 1 via OPHTHALMIC
  Filled 2021-02-19: qty 1

## 2021-02-19 NOTE — ED Notes (Signed)
Spanish Interpretor requested.

## 2021-02-19 NOTE — Discharge Instructions (Addendum)
Please go to the eye clinic that is listed at 8:30 in the morning to have an exam done of your eye.  Return to the ER for any other concerns, worse abdominal pain, fevers or any other concern

## 2021-02-19 NOTE — ED Triage Notes (Addendum)
Pt here with eye problem. Pt has not been able to see out of his left eye for 8 days now. Pt was getting dialysis treatment this am and was sent to ED for eye problem. Pt also states he has fluid in his stomach. Pt denies fall or injury to head.

## 2021-02-19 NOTE — ED Provider Notes (Signed)
[facility] Emergency Department Provider Note  ____________________________________________   Event Date/Time   First MD Initiated Contact with Patient 02/19/21 1337     (approximate)  I have reviewed the triage vital signs and the nursing notes.   HISTORY  Chief Complaint Eye Problem    HPI Mario [name] is a 50 y.o. adult with ESRD on dialysis TTSAt who comes in for Left eye vision issues. Pt reports noticing it 8 days ago when he closed R eye and noticed L eye was blurry, constant, nothing makes it better or worse. No flashes/floaters. No pain. No fever.  Also reports needing fluid drained off abdomen previously and some increasing fluid on abdomen. Last drained a year ago. No fever/ no pain.           Past Medical History:  Diagnosis Date   Abdominal pain 08/28/2016   Acute hyperkalemia 10/28/2016   Chest pain 04/11/2016   Chronic combined systolic and diastolic CHF (congestive heart failure) ([place])    Cirrhosis ([place])    Dialysis patient ([place])    ESRD (end stage renal disease) on dialysis ([place]) 01/25/2015   Fluid overload 04/12/2016   HTN (hypertension) 10/13/2016   Hyperkalemia 11/27/2016   Hypertension    Mitral regurgitation    Pulmonary edema 07/03/2016   Renal disorder    Renal insufficiency    Scrotal infection 01/25/2015    Patient Active Problem List   Diagnosis Date Noted   Encounter to establish care 05/03/2020   Near syncope 04/15/2020   Soft tissue mass in left temple area    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   Tobacco abuse 04/16/2019   Anemia in ESRD (end-stage renal disease) ([place]) 04/16/2019   Neck pain, bilateral posterior 04/16/2019   Cervical spinal stenosis 04/13/2019   Cervical vertebral fracture ([place]) 04/13/2019   Neck pain 04/03/2019   Cervical radiculopathy 04/03/2019   Hypertensive urgency 04/01/2019   Intractable abdominal pain 05/01/2018   Gastroenteritis 10/02/2017   Abdominal pain, acute 08/21/2017    Malnutrition of moderate degree 08/21/2017   SBP (spontaneous bacterial peritonitis) ([place]) 06/09/2017   Erroneous encounter - disregard 03/04/2017   Hyperkalemia 11/27/2016   Acute hyperkalemia 10/28/2016   Chronic diastolic (congestive) heart failure ([place]) 10/13/2016   Cirrhosis ([place]) 10/13/2016   HTN (hypertension) 10/13/2016   Abdominal pain 08/28/2016   Pulmonary edema 07/03/2016   Fluid overload 04/12/2016   Chest pain 04/11/2016   Scrotal infection 01/25/2015   ESRD (end stage renal disease) on dialysis ([place]) 01/25/2015    Past Surgical History:  Procedure Laterality Date   ANTERIOR CERVICAL CORPECTOMY N/A 04/05/2019   Procedure: ANTERIOR CERVICAL CORPECTOMY Cervical six and Cervical seven with strut graft fusion from Cervical five-Thoracic one   ;  Surgeon: Earnie Larsson, MD;  Location: [place];  Service: Neurosurgery;  Laterality: N/A;   AV FISTULA PLACEMENT     POSTERIOR CERVICAL FUSION/FORAMINOTOMY N/A 04/05/2019   Procedure: Posterior cervical fusion utilizing segmental instrumentation from Cervical five-Thoracic one ;  Surgeon: Earnie Larsson, MD;  Location: [place];  Service: Neurosurgery;  Laterality: N/A;    Prior to Admission medications   Medication Sig Start Date End Date Taking? Authorizing Provider  acetaminophen (TYLENOL) 500 MG tablet Take 500-1,000 mg by mouth every 6 (six) hours as needed for mild pain or fever.  Patient not taking: Reported on 05/03/2020    [provider]  amLODipine (NORVASC) 5 MG tablet Take 1 tablet (5 mg total) by mouth daily. 04/17/20 05/17/20  Sreenath,  Trula Slade, MD  apixaban (ELIQUIS) 5 MG TABS tablet Take 1 tablet (5 mg total) by mouth 2 (two) times daily. Patient not taking: Reported on 05/03/2020 04/17/20 05/17/20  Sidney Ace, MD  ferric citrate (AURYXIA) 1 GM 210 MG(Fe) tablet Take 420 mg by mouth 3 (three) times daily with meals.     [provider]  gabapentin (NEURONTIN) 100 MG capsule Take 1 capsule (100 mg total)  by mouth 3 (three) times daily as needed. Patient not taking: Reported on 05/03/2020 04/17/20 04/17/21  Sidney Ace, MD    Allergies Betadine [povidone iodine]  Family History  Problem Relation Age of Onset   Kidney failure Father    Diabetes Mother     Social History Social History   Tobacco Use   Smoking status: Every Day    Packs/day: 0.25    Years: 25.00    Pack years: 6.25    Types: Cigarettes   Smokeless tobacco: Never  Substance Use Topics   Alcohol use: No    Comment: pt denies   Drug use: No      Review of Systems Constitutional: No fever/chills Eyes: + vision changes  ENT: No sore throat. Cardiovascular: Denies chest pain. Respiratory: Denies shortness of breath. Gastrointestinal: + abd distension Genitourinary: Negative for dysuria. Musculoskeletal: Negative for back pain. Skin: Negative for rash. Neurological: Negative for headaches, focal weakness or numbness. All other ROS negative ____________________________________________   PHYSICAL EXAM:  VITAL SIGNS: ED Triage Vitals  Enc Vitals Group     BP 02/19/21 1102 (!) 146/77     Pulse Rate 02/19/21 1102 63     Resp 02/19/21 1102 20     Temp 02/19/21 1102 98.7 F (37.1 C)     Temp Source 02/19/21 1102 Oral     SpO2 02/19/21 1102 99 %     Weight 02/19/21 1115 154 lb 5.2 oz (70 kg)     Height 02/19/21 1115 _0  (1.6 m)     Head Circumference --      Peak Flow --      Pain Score 02/19/21 1115 0     Pain Loc --      Pain Edu? --      Excl. in [place]? --     Constitutional: Alert and oriented. Well appearing and in no acute distress. Eyes: Conjunctivae are normal. EOMI. L pupil cloudy but reactive, pressure 10,11,12 on checks. [place]. Negative. NO field cuts. Able to see hand but not distinguish fingers.  Head: Atraumatic. Nose: No congestion/rhinnorhea. Mouth/Throat: Mucous membranes are moist.   Neck: No stridor. Trachea Midline. FROM Cardiovascular: Normal rate, regular rhythm.  Grossly normal heart sounds.  Good peripheral circulation. Respiratory: Normal respiratory effort.  No retractions. Lungs CTAB. Gastrointestinal:  + fluid wave, distension. Varicose veins noted  Musculoskeletal: No lower extremity tenderness nor edema.  No joint effusions. Neurologic:  Normal speech and language. No gross focal neurologic deficits are appreciated.  Skin:  Skin is warm, dry and intact. No rash noted. Psychiatric: Mood and affect are normal. Speech and behavior are normal. GU: Deferred   ____________________________________________   LABS (all labs ordered are listed, but only abnormal results are displayed)  Labs Reviewed  CBC WITH DIFFERENTIAL/PLATELET - Abnormal; Notable for the following components:      Result Value   RBC 3.89 (*)    MCV 104.6 (*)    Platelets 95 (*)    Lymphs Abs 0.6 (*)    All other components within normal  limits  COMPREHENSIVE METABOLIC PANEL - Abnormal; Notable for the following components:   Chloride 96 (*)    BUN 36 (*)    Creatinine, Ser 7.78 (*)    Calcium 8.1 (*)    Alkaline Phosphatase 216 (*)    GFR, Estimated 8 (*)    All other components within normal limits  LIPASE, BLOOD - Abnormal; Notable for the following components:   Lipase 82 (*)    All other components within normal limits  SEDIMENTATION RATE   ____________________________________________   INITIAL IMPRESSION / ASSESSMENT AND PLAN / ED COURSE  Mario [name] was evaluated in Emergency Department on 02/19/2021 for the symptoms described in the history of present illness. He was evaluated in the context of the global COVID-19 pandemic, which necessitated consideration that the patient might be at risk for infection with the SARS-CoV-2 virus that causes COVID-19. Institutional protocols and algorithms that pertain to the evaluation of patients at risk for COVID-19 are in a state of rapid change based on information released by regulatory bodies including the CDC  and federal and state organizations. These policies and algorithms were followed during the patient's care in the ED.    Pt comes in with L eye issue. Pt with cloudy pupil possible cataract.  BS [place] without evidence of retinal attachment/vitreous hemorrhage. ESR negative unlikely temporal arteritis. No glaucoma. D/w Dr. Edison Pace ophtho who will see patient tomorrow in clinic which is resonable given this has been going on for 8 days and I suspect is cataract. Does not sound consistent with CRVO/CRAO.  For abdomen, US shows ascites, no white count, fever, pain, to suggest SBP. Recommend outpt drainage and I messaged his nephrologist to see if they could help schedule.  LFTS no decompensated cirrhosis. No indications for admission. Discussed return precautions.       ____________________________________________   FINAL CLINICAL IMPRESSION(S) / ED DIAGNOSES   Final diagnoses:  Vision changes  Other ascites      MEDICATIONS GIVEN DURING THIS VISIT:  Medications  tetracaine (PONTOCAINE) 0.5 % ophthalmic solution 1 drop (1 drop Left Eye Given by Other 02/19/21 1427)  fluorescein ophthalmic strip 1 strip (1 strip Right Eye Given by Other 02/19/21 1427)     ED Discharge Orders     None        Note:  This document was prepared using Dragon voice recognition software and may include unintentional dictation errors.    Vanessa , MD 02/19/21 [phone]

## 2021-03-19 ENCOUNTER — Encounter: Payer: Self-pay | Admitting: Anesthesiology

## 2021-03-19 ENCOUNTER — Encounter: Payer: Self-pay | Admitting: Ophthalmology

## 2021-03-21 NOTE — Discharge Instructions (Signed)

## 2021-03-25 ENCOUNTER — Ambulatory Visit: Admission: RE | Admit: 2021-03-25 | Payer: Self-pay | Source: Home / Self Care | Admitting: Ophthalmology

## 2021-03-25 HISTORY — DX: Complete loss of teeth, unspecified cause, unspecified class: K08.109

## 2021-03-25 SURGERY — PHACOEMULSIFICATION, CATARACT, WITH IOL INSERTION
Anesthesia: Topical | Laterality: Left

## 2021-04-15 ENCOUNTER — Encounter: Admission: RE | Payer: Self-pay | Source: Home / Self Care

## 2021-04-15 ENCOUNTER — Ambulatory Visit: Admission: RE | Admit: 2021-04-15 | Payer: Self-pay | Source: Home / Self Care | Admitting: Ophthalmology

## 2021-04-15 SURGERY — PHACOEMULSIFICATION, CATARACT, WITH IOL INSERTION
Anesthesia: Topical | Laterality: Right

## 2021-06-06 ENCOUNTER — Other Ambulatory Visit: Payer: Self-pay

## 2021-06-06 ENCOUNTER — Emergency Department
Admission: EM | Admit: 2021-06-06 | Discharge: 2021-06-06 | Disposition: A | Discharge status: Home / Self Care | Payer: Self-pay | Attending: Emergency Medicine | Admitting: Emergency Medicine

## 2021-06-06 ENCOUNTER — Emergency Department: Payer: Self-pay

## 2021-06-06 DIAGNOSIS — F17210 Nicotine dependence, cigarettes, uncomplicated: Secondary | ICD-10-CM | POA: Insufficient documentation

## 2021-06-06 DIAGNOSIS — I5042 Chronic combined systolic (congestive) and diastolic (congestive) heart failure: Secondary | ICD-10-CM | POA: Insufficient documentation

## 2021-06-06 DIAGNOSIS — S7001XA Contusion of right hip, initial encounter: Secondary | ICD-10-CM | POA: Insufficient documentation

## 2021-06-06 DIAGNOSIS — M25551 Pain in right hip: Secondary | ICD-10-CM

## 2021-06-06 DIAGNOSIS — Y9289 Other specified places as the place of occurrence of the external cause: Secondary | ICD-10-CM | POA: Insufficient documentation

## 2021-06-06 DIAGNOSIS — N186 End stage renal disease: Secondary | ICD-10-CM | POA: Insufficient documentation

## 2021-06-06 DIAGNOSIS — Z992 Dependence on renal dialysis: Secondary | ICD-10-CM | POA: Insufficient documentation

## 2021-06-06 DIAGNOSIS — Y9301 Activity, walking, marching and hiking: Secondary | ICD-10-CM | POA: Insufficient documentation

## 2021-06-06 DIAGNOSIS — Z79899 Other long term (current) drug therapy: Secondary | ICD-10-CM | POA: Insufficient documentation

## 2021-06-06 DIAGNOSIS — Z7901 Long term (current) use of anticoagulants: Secondary | ICD-10-CM | POA: Insufficient documentation

## 2021-06-06 DIAGNOSIS — W109XXA Fall (on) (from) unspecified stairs and steps, initial encounter: Secondary | ICD-10-CM | POA: Insufficient documentation

## 2021-06-06 DIAGNOSIS — T148XXA Other injury of unspecified body region, initial encounter: Secondary | ICD-10-CM

## 2021-06-06 DIAGNOSIS — I132 Hypertensive heart and chronic kidney disease with heart failure and with stage 5 chronic kidney disease, or end stage renal disease: Secondary | ICD-10-CM | POA: Insufficient documentation

## 2021-06-06 MED ORDER — ACETAMINOPHEN 325 MG PO TABS
650.0000 mg | ORAL_TABLET | Freq: Once | ORAL | Status: AC
  Administered 2021-06-06: 650 mg via ORAL
  Filled 2021-06-06: qty 2

## 2021-06-06 MED ORDER — ACETAMINOPHEN 500 MG PO TABS: 1000.0000 mg | ORAL_TABLET | Freq: Once | ORAL | Status: DC

## 2021-06-06 MED ORDER — OXYCODONE HCL 5 MG PO TABS
5.0000 mg | ORAL_TABLET | Freq: Once | ORAL | Status: AC
  Administered 2021-06-06: 5 mg via ORAL
  Filled 2021-06-06: qty 1

## 2021-06-06 NOTE — ED Triage Notes (Addendum)
Pt was at dialysis and was c/o right hip pain, he did not receive his treatment today. EMS reports seeing bruising to the right hip  and lower abd. Last dialysis was on Tuesday, with visual interpreter pt reports he was walking up 3 steps on Sunday and fell and felt something cracked in his hip.  Pt was given 650mg  of tylenol at dialysis today

## 2021-06-06 NOTE — ED Provider Notes (Signed)
[facility] Emergency Department Provider Note ____________________________________________   Event Date/Time   First MD Initiated Contact with Patient 06/06/21 0708     (approximate)  I have reviewed the triage vital signs and the nursing notes.  HISTORY  Chief Complaint Hip Pain   HPI Mario [name] is a 50 y.o. adultwho presents to the ED for evaluation of right hip pain.   Chart review indicates ESRD pt on T,Th,S iHD through [facility].  Reportedly compliant with dialysis.  He was at his local hemodialysis center this morning, and prior to hemodialysis being initiated, he was complaining of severe right hip pain.  Due to this, he was sent to the ED for evaluation.  Patient reports falling accidentally on Sunday, 4 days ago, or going up 3 steps to his house.  He has been unable to effectively ambulate due to pain of his right hip since then.  He thought it would get better with time, but it did not, due to this he presents to the ED.  Reports using Tylenol with minimal relief.  Up to 10/10 pain.  Aching and nonradiating.  Denies hematochezia, diarrhea, abdominal pain, emesis or fever.  Spanish interpreter utilized for history and physical.  Past Medical History:  Diagnosis Date   Abdominal pain 08/28/2016   Acute hyperkalemia 10/28/2016   Chest pain 04/11/2016   Chronic combined systolic and diastolic CHF (congestive heart failure) ([place])    Cirrhosis ([place])    Dialysis patient ([place])    Edentulous    ESRD (end stage renal disease) on dialysis ([place]) 01/25/2015   Fluid overload 04/12/2016   HTN (hypertension) 10/13/2016   Hyperkalemia 11/27/2016   Hypertension    Mitral regurgitation    Pulmonary edema 07/03/2016   Renal disorder    Renal insufficiency    Scrotal infection 01/25/2015    Patient Active Problem List   Diagnosis Date Noted   Encounter to establish care 05/03/2020   Near syncope 04/15/2020   Soft tissue mass in left  temple area    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   Tobacco abuse 04/16/2019   Anemia in ESRD (end-stage renal disease) ([place]) 04/16/2019   Neck pain, bilateral posterior 04/16/2019   Cervical spinal stenosis 04/13/2019   Cervical vertebral fracture ([place]) 04/13/2019   Neck pain 04/03/2019   Cervical radiculopathy 04/03/2019   Hypertensive urgency 04/01/2019   Intractable abdominal pain 05/01/2018   Gastroenteritis 10/02/2017   Abdominal pain, acute 08/21/2017   Malnutrition of moderate degree 08/21/2017   SBP (spontaneous bacterial peritonitis) ([place]) 06/09/2017   Erroneous encounter - disregard 03/04/2017   Hyperkalemia 11/27/2016   Acute hyperkalemia 10/28/2016   Chronic diastolic (congestive) heart failure ([place]) 10/13/2016   Cirrhosis ([place]) 10/13/2016   HTN (hypertension) 10/13/2016   Abdominal pain 08/28/2016   Pulmonary edema 07/03/2016   Fluid overload 04/12/2016   Chest pain 04/11/2016   Scrotal infection 01/25/2015   ESRD (end stage renal disease) on dialysis ([place]) 01/25/2015    Past Surgical History:  Procedure Laterality Date   ANTERIOR CERVICAL CORPECTOMY N/A 04/05/2019   Procedure: ANTERIOR CERVICAL CORPECTOMY Cervical six and Cervical seven with strut graft fusion from Cervical five-Thoracic one   ;  Surgeon: Earnie Larsson, MD;  Location: [place];  Service: Neurosurgery;  Laterality: N/A;   AV FISTULA PLACEMENT     POSTERIOR CERVICAL FUSION/FORAMINOTOMY N/A 04/05/2019   Procedure: Posterior cervical fusion utilizing segmental instrumentation from Cervical five-Thoracic one ;  Surgeon: Earnie Larsson, MD;  Location: [place];  Service:  Neurosurgery;  Laterality: N/A;    Prior to Admission medications   Medication Sig Start Date End Date Taking? Authorizing Provider  acetaminophen (TYLENOL) 500 MG tablet Take 500-1,000 mg by mouth every 6 (six) hours as needed for mild pain or fever.  Patient not taking: No sig reported    [provider]  amLODipine (NORVASC) 5 MG tablet  Take 1 tablet (5 mg total) by mouth daily. 04/17/20 03/19/21  Sidney Ace, MD  apixaban (ELIQUIS) 5 MG TABS tablet Take 1 tablet (5 mg total) by mouth 2 (two) times daily. Patient not taking: Reported on 05/03/2020 04/17/20 05/17/20  Sidney Ace, MD  ferric citrate (AURYXIA) 1 GM 210 MG(Fe) tablet Take 420 mg by mouth 3 (three) times daily with meals.     [provider]  gabapentin (NEURONTIN) 100 MG capsule Take 1 capsule (100 mg total) by mouth 3 (three) times daily as needed. Patient not taking: No sig reported 04/17/20 04/17/21  Sidney Ace, MD    Allergies Betadine [povidone iodine]  Family History  Problem Relation Age of Onset   Kidney failure Father    Diabetes Mother     Social History Social History   Tobacco Use   Smoking status: Every Day    Packs/day: 0.25    Years: 25.00    Pack years: 6.25    Types: Cigarettes   Smokeless tobacco: Never  Substance Use Topics   Alcohol use: No    Comment: pt denies   Drug use: No    Review of Systems  Constitutional: No fever/chills Eyes: No visual changes. ENT: No sore throat. Cardiovascular: Denies chest pain. Respiratory: Denies shortness of breath. Gastrointestinal: No abdominal pain.  No nausea, no vomiting.  No diarrhea.  No constipation. Genitourinary: Negative for dysuria. Musculoskeletal: Negative for back pain. Right hip pain after a fall Skin: Negative for rash. Neurological: Negative for headaches, focal weakness or numbness.  ____________________________________________   PHYSICAL EXAM:  VITAL SIGNS: Vitals:   06/06/21 0704  BP: (!) 183/84  Pulse: 68  Resp: 17  Temp: 98.2 F (36.8 C)  SpO2: 100%     Constitutional: Alert and oriented. Well appearing and in no acute distress. Conversational via interpreter.  Is able to transfer from EMS stretcher to our stretcher with a couple shuffling antalgic steps. Eyes: Conjunctivae are normal. PERRL. EOMI. Head:  Atraumatic. Nose: No congestion/rhinnorhea. Mouth/Throat: Mucous membranes are moist.  Oropharynx non-erythematous. Neck: No stridor. No cervical spine tenderness to palpation. Cardiovascular: Normal rate, regular rhythm. Grossly normal heart sounds.  Good peripheral circulation. Respiratory: Normal respiratory effort.  No retractions. Lungs CTAB. Gastrointestinal: Soft , nondistended, nontender to palpation. No CVA tenderness. Soft and benign throughout. Musculoskeletal:   No joint effusions.  Flat bruising to the anterior right hip and lateral right hip overlying his iliac crest.  Tenderness to palpation throughout the right hip area without evidence of laceration or open injury.  Right leg is distally neurovascularly intact.  No further signs of trauma to the extremities or back.  No spinal step-offs or signs of trauma to the back. Pain with ranging of the right hip. AV fistula to left upper extremity with palpable thrill. Neurologic:  Normal speech and language. No gross focal neurologic deficits are appreciated. Skin:  Skin is warm, dry and intact. No rash noted. Psychiatric: Mood and affect are normal. Speech and behavior are normal. ____________________________________________   LABS (all labs ordered are listed, but only abnormal results are displayed)  Labs Reviewed -  No data to display ____________________________________________  12 Lead EKG   ____________________________________________  RADIOLOGY  ED MD interpretation: Plain film of the pelvis and right hip reviewed by me without evidence of fracture or dislocation.  Official radiology report(s): DG Hip Unilat W or Wo Pelvis 2-3 Views Right  Result Date: 06/06/2021 CLINICAL DATA:  50 year old male with history of trauma from a fall complaining of right-sided hip pain. EXAM: DG HIP (WITH OR WITHOUT PELVIS) 2-3V RIGHT COMPARISON:  No priors. FINDINGS: Old healed fractures of the left parasymphyseal region with  posttraumatic deformity. No acute displaced pelvic fractures are noted. Bilateral proximal femurs as visualized appear intact. Right femoral head is located. Mild joint space narrowing, subchondral sclerosis and osteophyte formation in the hip joints bilaterally indicative of osteoarthritis. IMPRESSION: 1. No acute radiographic abnormality of the bony pelvis or right hip. 2. Posttraumatic deformity in the left parasymphyseal region. 3. Mild bilateral hip joint osteoarthritis. Electronically Signed   By: Vinnie Langton M.D.   On: 06/06/2021 08:00    ____________________________________________   PROCEDURES and INTERVENTIONS  Procedure(s) performed (including Critical Care):  Procedures  Medications  oxyCODONE (Oxy IR/ROXICODONE) immediate release tablet 5 mg (5 mg Oral Given 06/06/21 0724)  acetaminophen (TYLENOL) tablet 650 mg (650 mg Oral Given 06/06/21 0724)    ____________________________________________   MDM / ED COURSE   50 year old male presents to the ED with a few days of right hip pain after a mechanical fall, without evidence of significant injury beyond bruising and MSK strain, amenable to outpatient management.  No evidence of neurologic vascular deficits.  No signs of open injuries or laceration.  Does have bruising overlying his lateral and anterior right hip with associated diffuse tenderness.  His abdomen is benign.  X-ray without fracture or dislocation.  He reports compliance with dialysis up until today, and I urged him to continue this.  We discussed return precautions to the ED.  Clinical Course as of 06/06/21 0821  Thu Jun 06, 2021  0818 Reassessed.  Patient reports improved pain.  Educated patient on reassuring x-ray, via the interpreter.  We discussed the importance of dialysis and to follow-up with [facility].  We discussed bruising and MSK pain.  We discussed return precautions for the ED.  He is asking for discharge to go home.  Reports that he will "just get dialyzed  tomorrow."  I urged him to call [facility] today. [DS]    Clinical Course User Index [DS] Vladimir Crofts, MD    ____________________________________________   FINAL CLINICAL IMPRESSION(S) / ED DIAGNOSES  Final diagnoses:  Bruising  Right hip pain  ESRD (end stage renal disease) on dialysis [facility])     ED Discharge Orders     None        Melida Northington   Note:  This document was prepared using Dragon voice recognition software and may include unintentional dictation errors.    Vladimir Crofts, MD 06/06/21 [phone]

## 2021-06-06 NOTE — ED Notes (Signed)
Patient transported to X-ray 

## 2021-06-06 NOTE — Discharge Instructions (Addendum)
Use Tylenol for pain and fevers.  Up to 1000 mg per dose, up to 4 times per day.  Do not take more than 4000 mg of Tylenol/acetaminophen within 24 hours..  

## 2021-10-15 ENCOUNTER — Encounter: Payer: Self-pay | Admitting: Emergency Medicine

## 2021-10-15 ENCOUNTER — Emergency Department: Payer: Self-pay

## 2021-10-15 ENCOUNTER — Other Ambulatory Visit: Payer: Self-pay

## 2021-10-15 ENCOUNTER — Inpatient Hospital Stay
Admission: EM | Admit: 2021-10-15 | Discharge: 2021-10-18 | DRG: 871 | Disposition: A | Discharge status: Home / Self Care | Payer: Self-pay | Attending: Internal Medicine | Admitting: Internal Medicine

## 2021-10-15 DIAGNOSIS — N186 End stage renal disease: Secondary | ICD-10-CM

## 2021-10-15 DIAGNOSIS — E875 Hyperkalemia: Secondary | ICD-10-CM | POA: Diagnosis present

## 2021-10-15 DIAGNOSIS — A419 Sepsis, unspecified organism: Principal | ICD-10-CM | POA: Diagnosis present

## 2021-10-15 DIAGNOSIS — Z841 Family history of disorders of kidney and ureter: Secondary | ICD-10-CM

## 2021-10-15 DIAGNOSIS — I132 Hypertensive heart and chronic kidney disease with heart failure and with stage 5 chronic kidney disease, or end stage renal disease: Secondary | ICD-10-CM | POA: Diagnosis present

## 2021-10-15 DIAGNOSIS — I340 Nonrheumatic mitral (valve) insufficiency: Secondary | ICD-10-CM | POA: Diagnosis present

## 2021-10-15 DIAGNOSIS — R531 Weakness: Secondary | ICD-10-CM

## 2021-10-15 DIAGNOSIS — Z6822 Body mass index (BMI) 22.0-22.9, adult: Secondary | ICD-10-CM

## 2021-10-15 DIAGNOSIS — Z20822 Contact with and (suspected) exposure to covid-19: Secondary | ICD-10-CM | POA: Diagnosis present

## 2021-10-15 DIAGNOSIS — M4802 Spinal stenosis, cervical region: Secondary | ICD-10-CM | POA: Diagnosis present

## 2021-10-15 DIAGNOSIS — Z79899 Other long term (current) drug therapy: Secondary | ICD-10-CM

## 2021-10-15 DIAGNOSIS — R109 Unspecified abdominal pain: Secondary | ICD-10-CM | POA: Diagnosis present

## 2021-10-15 DIAGNOSIS — R519 Headache, unspecified: Secondary | ICD-10-CM | POA: Diagnosis present

## 2021-10-15 DIAGNOSIS — R1084 Generalized abdominal pain: Principal | ICD-10-CM

## 2021-10-15 DIAGNOSIS — F17210 Nicotine dependence, cigarettes, uncomplicated: Secondary | ICD-10-CM | POA: Diagnosis present

## 2021-10-15 DIAGNOSIS — R64 Cachexia: Secondary | ICD-10-CM | POA: Diagnosis present

## 2021-10-15 DIAGNOSIS — R188 Other ascites: Secondary | ICD-10-CM | POA: Diagnosis present

## 2021-10-15 DIAGNOSIS — I10 Essential (primary) hypertension: Secondary | ICD-10-CM | POA: Diagnosis present

## 2021-10-15 DIAGNOSIS — D631 Anemia in chronic kidney disease: Secondary | ICD-10-CM | POA: Diagnosis present

## 2021-10-15 DIAGNOSIS — R509 Fever, unspecified: Secondary | ICD-10-CM

## 2021-10-15 DIAGNOSIS — K766 Portal hypertension: Secondary | ICD-10-CM | POA: Diagnosis present

## 2021-10-15 DIAGNOSIS — Z888 Allergy status to other drugs, medicaments and biological substances status: Secondary | ICD-10-CM

## 2021-10-15 DIAGNOSIS — R112 Nausea with vomiting, unspecified: Secondary | ICD-10-CM | POA: Diagnosis present

## 2021-10-15 DIAGNOSIS — K7460 Unspecified cirrhosis of liver: Secondary | ICD-10-CM | POA: Diagnosis present

## 2021-10-15 DIAGNOSIS — Z720 Tobacco use: Secondary | ICD-10-CM | POA: Diagnosis present

## 2021-10-15 DIAGNOSIS — I5032 Chronic diastolic (congestive) heart failure: Secondary | ICD-10-CM | POA: Diagnosis present

## 2021-10-15 DIAGNOSIS — Z992 Dependence on renal dialysis: Secondary | ICD-10-CM

## 2021-10-15 DIAGNOSIS — Z981 Arthrodesis status: Secondary | ICD-10-CM

## 2021-10-15 DIAGNOSIS — D735 Infarction of spleen: Secondary | ICD-10-CM | POA: Diagnosis present

## 2021-10-15 DIAGNOSIS — G629 Polyneuropathy, unspecified: Secondary | ICD-10-CM | POA: Diagnosis present

## 2021-10-15 DIAGNOSIS — K652 Spontaneous bacterial peritonitis: Secondary | ICD-10-CM | POA: Diagnosis present

## 2021-10-15 LAB — HEPATIC FUNCTION PANEL
ALT: 18 U/L (ref 0–44)
AST: 30 U/L (ref 15–41)
Albumin: 3.5 g/dL (ref 3.5–5.0)
Alkaline Phosphatase: 145 U/L — ABNORMAL HIGH (ref 38–126)
Bilirubin, Direct: 0.2 mg/dL (ref 0.0–0.2)
Indirect Bilirubin: 0.8 mg/dL (ref 0.3–0.9)
Total Bilirubin: 1 mg/dL (ref 0.3–1.2)
Total Protein: 7 g/dL (ref 6.5–8.1)

## 2021-10-15 LAB — CBC
HCT: 39 % (ref 39.0–52.0)
Hemoglobin: 12.3 g/dL — ABNORMAL LOW (ref 13.0–17.0)
MCH: 32.9 pg (ref 26.0–34.0)
MCHC: 31.5 g/dL (ref 30.0–36.0)
MCV: 104.3 fL — ABNORMAL HIGH (ref 80.0–100.0)
Platelets: 81 10*3/uL — ABNORMAL LOW (ref 150–400)
RBC: 3.74 MIL/uL — ABNORMAL LOW (ref 4.22–5.81)
RDW: 13.9 % (ref 11.5–15.5)
WBC: 3.8 10*3/uL — ABNORMAL LOW (ref 4.0–10.5)
nRBC: 0 % (ref 0.0–0.2)

## 2021-10-15 LAB — CBC WITH DIFFERENTIAL/PLATELET
Abs Immature Granulocytes: 0.01 10*3/uL (ref 0.00–0.07)
Basophils Absolute: 0 10*3/uL (ref 0.0–0.1)
Basophils Relative: 1 %
Eosinophils Absolute: 0.1 10*3/uL (ref 0.0–0.5)
Eosinophils Relative: 2 %
HCT: 39.1 % (ref 39.0–52.0)
Hemoglobin: 12.3 g/dL — ABNORMAL LOW (ref 13.0–17.0)
Immature Granulocytes: 0 %
Lymphocytes Relative: 16 %
Lymphs Abs: 0.6 10*3/uL — ABNORMAL LOW (ref 0.7–4.0)
MCH: 32.9 pg (ref 26.0–34.0)
MCHC: 31.5 g/dL (ref 30.0–36.0)
MCV: 104.5 fL — ABNORMAL HIGH (ref 80.0–100.0)
Monocytes Absolute: 0.3 10*3/uL (ref 0.1–1.0)
Monocytes Relative: 9 %
Neutro Abs: 2.8 10*3/uL (ref 1.7–7.7)
Neutrophils Relative %: 72 %
Platelets: 80 10*3/uL — ABNORMAL LOW (ref 150–400)
RBC: 3.74 MIL/uL — ABNORMAL LOW (ref 4.22–5.81)
RDW: 14 % (ref 11.5–15.5)
WBC: 3.8 10*3/uL — ABNORMAL LOW (ref 4.0–10.5)
nRBC: 0 % (ref 0.0–0.2)

## 2021-10-15 LAB — PROCALCITONIN: Procalcitonin: 1.16 ng/mL

## 2021-10-15 LAB — BASIC METABOLIC PANEL
Anion gap: 15 (ref 5–15)
BUN: 80 mg/dL — ABNORMAL HIGH (ref 6–20)
CO2: 24 mmol/L (ref 22–32)
Calcium: 8 mg/dL — ABNORMAL LOW (ref 8.9–10.3)
Chloride: 96 mmol/L — ABNORMAL LOW (ref 98–111)
Creatinine, Ser: 8.96 mg/dL — ABNORMAL HIGH (ref 0.61–1.24)
GFR, Estimated: 7 mL/min — ABNORMAL LOW (ref >60)
Glucose, Bld: 131 mg/dL — ABNORMAL HIGH (ref 70–99)
Potassium: 5 mmol/L (ref 3.5–5.1)
Sodium: 135 mmol/L (ref 135–145)

## 2021-10-15 LAB — BODY FLUID CELL COUNT WITH DIFFERENTIAL
Eos, Fluid: 0 %
Lymphs, Fluid: 30 %
Monocyte-Macrophage-Serous Fluid: 67 %
Neutrophil Count, Fluid: 3 %
Total Nucleated Cell Count, Fluid: 367 cu mm

## 2021-10-15 LAB — PROTIME-INR
INR: 1.4 — ABNORMAL HIGH (ref 0.8–1.2)
Prothrombin Time: 16.8 seconds — ABNORMAL HIGH (ref 11.4–15.2)

## 2021-10-15 LAB — TROPONIN I (HIGH SENSITIVITY)
Troponin I (High Sensitivity): 29 ng/L — ABNORMAL HIGH (ref <18)
Troponin I (High Sensitivity): 30 ng/L — ABNORMAL HIGH (ref <18)

## 2021-10-15 LAB — LIPASE, BLOOD: Lipase: 39 U/L (ref 11–51)

## 2021-10-15 LAB — RESP PANEL BY RT-PCR (FLU A&B, COVID) ARPGX2
Influenza A by PCR: NEGATIVE
Influenza B by PCR: NEGATIVE
SARS Coronavirus 2 by RT PCR: NEGATIVE

## 2021-10-15 LAB — LACTIC ACID, PLASMA
Lactic Acid, Venous: 1.1 mmol/L (ref 0.5–1.9)
Lactic Acid, Venous: 1.1 mmol/L (ref 0.5–1.9)

## 2021-10-15 LAB — AMMONIA: Ammonia: 30 umol/L (ref 9–35)

## 2021-10-15 MED ORDER — NICOTINE 14 MG/24HR TD PT24: 14.0000 mg | MEDICATED_PATCH | Freq: Every day | TRANSDERMAL | Status: DC | PRN

## 2021-10-15 MED ORDER — ACETAMINOPHEN 325 MG PO TABS
650.0000 mg | ORAL_TABLET | Freq: Four times a day (QID) | ORAL | Status: DC | PRN
  Administered 2021-10-16: 650 mg via ORAL
  Filled 2021-10-15 (×2): qty 2

## 2021-10-15 MED ORDER — ONDANSETRON 4 MG PO TBDP
4.0000 mg | ORAL_TABLET | Freq: Once | ORAL | Status: AC
  Administered 2021-10-15: 4 mg via ORAL
  Filled 2021-10-15: qty 1

## 2021-10-15 MED ORDER — HYDRALAZINE HCL 20 MG/ML IJ SOLN
5.0000 mg | Freq: Four times a day (QID) | INTRAMUSCULAR | Status: AC | PRN
  Administered 2021-10-16: 5 mg via INTRAVENOUS
  Filled 2021-10-15: qty 1

## 2021-10-15 MED ORDER — ACETAMINOPHEN 500 MG PO TABS
1000.0000 mg | ORAL_TABLET | Freq: Once | ORAL | Status: AC
  Administered 2021-10-15: 1000 mg via ORAL
  Filled 2021-10-15: qty 2

## 2021-10-15 MED ORDER — ONDANSETRON HCL 4 MG/2ML IJ SOLN
4.0000 mg | Freq: Four times a day (QID) | INTRAMUSCULAR | Status: DC | PRN
  Administered 2021-10-15 – 2021-10-18 (×4): 4 mg via INTRAVENOUS
  Filled 2021-10-15 (×5): qty 2

## 2021-10-15 MED ORDER — ACETAMINOPHEN 650 MG RE SUPP: 650.0000 mg | Freq: Four times a day (QID) | RECTAL | Status: DC | PRN

## 2021-10-15 MED ORDER — ONDANSETRON HCL 4 MG PO TABS: 4.0000 mg | ORAL_TABLET | Freq: Four times a day (QID) | ORAL | Status: DC | PRN

## 2021-10-15 MED ORDER — SODIUM CHLORIDE 0.9 % IV SOLN
2.0000 g | Freq: Once | INTRAVENOUS | Status: AC
  Administered 2021-10-15: 2 g via INTRAVENOUS
  Filled 2021-10-15: qty 20

## 2021-10-15 MED ORDER — SODIUM CHLORIDE 0.9 % IV SOLN: 2.0000 g | INTRAVENOUS | Status: DC

## 2021-10-15 MED ORDER — HEPARIN SODIUM (PORCINE) 5000 UNIT/ML IJ SOLN
5000.0000 [IU] | Freq: Three times a day (TID) | INTRAMUSCULAR | Status: DC
  Administered 2021-10-16 – 2021-10-18 (×7): 5000 [IU] via SUBCUTANEOUS
  Filled 2021-10-15 (×7): qty 1

## 2021-10-15 MED ORDER — SODIUM CHLORIDE 0.9 % IV SOLN
2.0000 g | INTRAVENOUS | Status: DC
  Administered 2021-10-17 (×2): 2 g via INTRAVENOUS
  Filled 2021-10-15 (×2): qty 2

## 2021-10-15 MED ORDER — SODIUM CHLORIDE 0.9 % IV SOLN: 1.0000 g | Freq: Once | INTRAVENOUS | Status: DC

## 2021-10-15 NOTE — ED Notes (Signed)
Pt reports nausea, will review orders ?

## 2021-10-15 NOTE — ED Notes (Signed)
Report received from John, RN

## 2021-10-15 NOTE — Hospital Course (Addendum)
[name] is a 51 year old male with history of ESRD on HD TThSa, known chronic liver cirrhosis, portal hypertension, hypertension, neuropathy, who presents to the ED for chief concern of weakness and shortness of breath.  ? ?Vitals in the ED showed temperature is 100 and decreased to 98.4, respiration rate of 20, heart rate 65, initial BP 177/97, spO2 of 99% on room air.  ? ?Sodium is 135, potassium 5.0, chloride 96, bicarb 24, nonfasting glucose at 131, bun 80, sCr 8.96, sCa 8.0, eGFR 7.  ? ?WBC 3.8, hgb 12.3, platelets 81.  ? ?ED performed diagnostic paracentesis and body fluid samples sent out to lab.  ? ?ED treatment: ceftriaxone 2 g IV, ondansetron 4 mg PO, acetaminophen 1000 mg.  ?

## 2021-10-15 NOTE — H&P (Addendum)
?History and Physical  ? ?Mario [name] HWE:[id number] DOB: [birth year] DOA: 10/15/2021 ? ?PCP: [name], MD  ?Outpatient Specialists: Dr. Annette Stable, neurosurgery ?Patient coming from: home  ? ?I have personally briefly reviewed patient's old medical records in [place]. ? ?Chief Concern: weakness and muscle aches ? ?HPI: [name] is a 51 year old male with history of ESRD on HD TThSa, known chronic liver cirrhosis, portal hypertension, hypertension, neuropathy, who presents to the ED for chief concern of weakness and shortness of breath.  ? ?Vitals in the ED showed temperature is 100 and decreased to 98.4, respiration rate of 20, heart rate 65, initial BP 177/97, spO2 of 99% on room air.  ? ?Sodium is 135, potassium 5.0, chloride 96, bicarb 24, nonfasting glucose at 131, bun 80, sCr 8.96, sCa 8.0, eGFR 7.  ? ?WBC 3.8, hgb 12.3, platelets 81.  ? ?ED performed diagnostic paracentesis and body fluid samples sent out to lab.  ? ?ED treatment: ceftriaxone 2 g IV, ondansetron 4 mg PO, acetaminophen 1000 mg.  ? ?At bedside, he is able to tell me his name, and age in [place] ? ?The following was portion of the H&P was obtained via a [place] interpreter. ? ?He is able to tell me the current calendar year, 2023 and he knows he is in the hospital. ? ?He states that about 4 days, he states he started feeling body aches, abdominal pain, fever, chills, non-productive cough.  He denies known sick contacts.  He states he lives alone. He endroses nausea, and denies diarrhea.   ? ?He states a Lucianne Lei takes him to HD. He missed HD today because he felt so weak. His last complete HD session was Saturday.  ? ?He reports he no longer makes urine. He reports last bowel movement in the AM on day of admission and it was normal, brown.  ? ?Social history: He lives alone. He is acurrent tobacco user, smoking 3 cigarettes. Per day. He denies etoh. He quit 5 years ago. He currently smokes marijuasry.   ? ?Vaccination history: He is vaccinated for covid 19, 3 times and influenza.  ? ?ROS: ?Constitutional: no weight change, no fever ?ENT/Mouth: no sore throat, no rhinorrhea ?Eyes: no eye pain, no vision changes ?Cardiovascular: no chest pain, no dyspnea,  no edema, no palpitations ?Respiratory: no cough, no sputum, no wheezing ?Gastrointestinal: no nausea, no vomiting, no diarrhea, no constipation ?Genitourinary: no urinary incontinence, no dysuria, no hematuria ?Musculoskeletal: no arthralgias, + myalgias ?Skin: no skin lesions, no pruritus, ?Neuro: + weakness, no loss of consciousness, no syncope ?Psych: no anxiety, no depression, + decrease appetite ?Heme/Lymph: no bruising, no bleeding ? ?ED Course: discussed with EDP, patient requiring hospitalization for chief concern of SBP. ? ?Assessment/Plan ? ?Principal Problem: ?  Sepsis ([place]) ?Active Problems: ?  Chronic diastolic (congestive) heart failure (HCC) ?  Cirrhosis ([place]) ?  HTN (hypertension) ?  ESRD (end stage renal disease) on dialysis [facility]) ?  Abdominal pain ?  Tobacco abuse ?  Anemia in ESRD (end-stage renal disease) ([place]) ?  Splenic infarction ?  ? ?Assessment and Plan: ? ?# Myalgia, weakness-etiology work-up at this time ?- Differentials include sepsis and SBP cannot be excluded at this time ?- Status post diagnostic paracentesis per EDP at bedside ?- Cultures have been collected and sent to lab for further evaluation ?- Blood cultures x2 are in progress ?- Continue with ceftriaxone 2 g IV ?- Check procalcitonin, ammonia level ?- Admit to telemetry cardiac observation ? ?#  Chronic heart failure, diastolic-strict I's and O's ?- Is currently not in exacerbation ? ?# End-stage renal disease on hemodialysis, missed HD session on day of admission ?- Last full hemodialysis was on 10/12/2021 ?- Nephrology has been consulted via staff message for resumption of dialysis ? ?# Hypertension-hydralazine 5 mg IV every 6 hours as needed for SBP greater than 175, 2 days  ordered ? ?# Tobacco use-nicotine patch as needed ordered ? ?# Anemia of chronic disease-within patient's baseline range ?- Range of 12.1-13.2, no clinical suspicion of bleeding at this time ? ?# History of splenic infarct-currently not on his Eliquis ? ?Chart reviewed.  ? ?Hospitalization from 04/15/2020 to 04/17/2020: ? ?DVT prophylaxis: Heparin ?Code Status: full code  ?Diet: Renal ?Family Communication: pt states there is no one he wishes me to call ?Disposition Plan: pending clinical course ?Consults called: nephrology ?Admission status: telemetry cardiac, observation ? ?Past Medical History:  ?Diagnosis Date  ? Abdominal pain 08/28/2016  ? Acute hyperkalemia 10/28/2016  ? Chest pain 04/11/2016  ? Chronic combined systolic and diastolic CHF (congestive heart failure) ([place])   ? Cirrhosis ([place])   ? Dialysis patient [facility])   ? Edentulous   ? ESRD (end stage renal disease) on dialysis ([place]) 01/25/2015  ? Fluid overload 04/12/2016  ? HTN (hypertension) 10/13/2016  ? Hyperkalemia 11/27/2016  ? Hypertension   ? Mitral regurgitation   ? Pulmonary edema 07/03/2016  ? Renal disorder   ? Renal insufficiency   ? Scrotal infection 01/25/2015  ? ?Past Surgical History:  ?Procedure Laterality Date  ? ANTERIOR CERVICAL CORPECTOMY N/A 04/05/2019  ? Procedure: ANTERIOR CERVICAL CORPECTOMY Cervical six and Cervical seven with strut graft fusion from Cervical five-Thoracic one   ;  Surgeon: Earnie Larsson, MD;  Location: [facility];  Service: Neurosurgery;  Laterality: N/A;  ? AV FISTULA PLACEMENT    ? POSTERIOR CERVICAL FUSION/FORAMINOTOMY N/A 04/05/2019  ? Procedure: Posterior cervical fusion utilizing segmental instrumentation from Cervical five-Thoracic one ;  Surgeon: Earnie Larsson, MD;  Location: [place];  Service: Neurosurgery;  Laterality: N/A;  ? ?Social History:  reports that she has been smoking cigarettes. She has a 6.25 pack-year smoking history. She has never used smokeless tobacco. She reports that she does not drink alcohol and does  not use drugs. ? ?Allergies  ?Allergen Reactions  ? Betadine [Povidone Iodine] Rash  ? ?Family History  ?Problem Relation Age of Onset  ? Kidney failure Father   ? Diabetes Mother   ? ?Family history: Family history reviewed and not pertinent ? ?Prior to Admission medications   ?Medication Sig Start Date End Date Taking? Authorizing Provider  ?acetaminophen (TYLENOL) 500 MG tablet Take 500-1,000 mg by mouth every 6 (six) hours as needed for mild pain or fever.  ?Patient not taking: No sig reported    [provider]  ?amLODipine (NORVASC) 5 MG tablet Take 1 tablet (5 mg total) by mouth daily. 04/17/20 03/19/21  Sidney Ace, MD  ?apixaban (ELIQUIS) 5 MG TABS tablet Take 1 tablet (5 mg total) by mouth 2 (two) times daily. ?Patient not taking: Reported on 05/03/2020 04/17/20 05/17/20  Sidney Ace, MD  ?ferric citrate (AURYXIA) 1 GM 210 MG(Fe) tablet Take 420 mg by mouth 3 (three) times daily with meals.  ?Patient not taking: Reported on 10/15/2021    [provider]  ?gabapentin (NEURONTIN) 100 MG capsule Take 1 capsule (100 mg total) by mouth 3 (three) times daily as needed. ?Patient not taking: No sig reported 04/17/20 04/17/21  Sidney Ace, MD  ? ?Physical Exam: ?Vitals:  ? 10/15/21 2136 10/15/21 2230 10/15/21 2300 10/16/21 0000  ?BP: (!) 155/90 (!) 167/57 (!) 145/59 (!) 154/58  ?Pulse: 78 70 66 77  ?Resp: _0 ?Temp:      ?TempSrc:      ?SpO2: 96% 98% 98% 98%  ?Weight:      ?Height:      ? ?Constitutional: appears older than chronological age, frail, cachectic, NAD, calm, comfortable ?Eyes: PERRL, lids and conjunctivae normal ?ENMT: Mucous membranes are moist. Posterior pharynx clear of any exudate or lesions. Age-appropriate dentition. Hearing appropriate ?Neck: normal, supple, no masses, no thyromegaly ?Respiratory: clear to auscultation bilaterally, no wheezing, no crackles. Normal respiratory effort. No accessory muscle use.  ?Cardiovascular: Regular rate and rhythm,  no murmurs / rubs / gallops. No extremity edema. 2+ pedal pulses. No carotid bruits.  Left upper extremity fistula in place, appropriate bruit ?Abdomen: no tenderness, no masses palpated, no hepatosplenomegaly. Lesly Rubenstein

## 2021-10-15 NOTE — ED Notes (Signed)
Dr. Starleen Blue at the bedside with interpreter services to perform diagnostic paracentesis. ?

## 2021-10-15 NOTE — ED Notes (Signed)
Dr. Tobie Poet at the bedside ?

## 2021-10-15 NOTE — ED Provider Notes (Addendum)
? ?[facility] ?Provider Note ? ? ? Event Date/Time  ? First MD Initiated Contact with Patient 10/15/21 1624   ?  (approximate) ? ? ?History  ? ?Fever and Headache ? ? ?HPI ? ?Mario [name] Coma is a 51 y.o. adult with past medical history of systolic heart failure, end-stage renal disease on dialysis, hypertension, cirrhosis, mitral regurgitation who presents with headache, fever and body aches.  Patient is feeling unwell for the past 5 days.  He endorses diffuse body aches which involve his entire body including chest abdomen and head.  Headache is diffuse gradual in onset not associated with neck pain focal numbness weakness or visual change.  He has not been eating and has had emesis.  Denies diarrhea.  Did endorse darker stool.  Also with complaint of upper abdominal discomfort that is intermittent only with vomiting.  Felt like he had fever at home but did not check his temperature.  Last dialysis was on Saturday, did not go today because he felt too unwell.  Has not been taking anything for discomfort. ?  ? ?Past Medical History:  ?Diagnosis Date  ? Abdominal pain 08/28/2016  ? Acute hyperkalemia 10/28/2016  ? Chest pain 04/11/2016  ? Chronic combined systolic and diastolic CHF (congestive heart failure) ([place])   ? Cirrhosis ([place])   ? Dialysis patient [facility])   ? Edentulous   ? ESRD (end stage renal disease) on dialysis ([place]) 01/25/2015  ? Fluid overload 04/12/2016  ? HTN (hypertension) 10/13/2016  ? Hyperkalemia 11/27/2016  ? Hypertension   ? Mitral regurgitation   ? Pulmonary edema 07/03/2016  ? Renal disorder   ? Renal insufficiency   ? Scrotal infection 01/25/2015  ? ? ?Patient Active Problem List  ? Diagnosis Date Noted  ? Sepsis ([place]) 10/15/2021  ? Encounter to establish care 05/03/2020  ? Near syncope 04/15/2020  ? Soft tissue mass in left temple area   ? 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  ? Tobacco abuse 04/16/2019  ? Anemia in ESRD (end-stage renal disease) ([place]) 04/16/2019  ?  Neck pain, bilateral posterior 04/16/2019  ? Cervical spinal stenosis 04/13/2019  ? Cervical vertebral fracture ([place]) 04/13/2019  ? Neck pain 04/03/2019  ? Cervical radiculopathy 04/03/2019  ? Hypertensive urgency 04/01/2019  ? Intractable abdominal pain 05/01/2018  ? Gastroenteritis 10/02/2017  ? Abdominal pain, acute 08/21/2017  ? Malnutrition of moderate degree 08/21/2017  ? SBP (spontaneous bacterial peritonitis) ([place]) 06/09/2017  ? Erroneous encounter - disregard 03/04/2017  ? Hyperkalemia 11/27/2016  ? Acute hyperkalemia 10/28/2016  ? Chronic diastolic (congestive) heart failure ([place]) 10/13/2016  ? Cirrhosis ([place]) 10/13/2016  ? HTN (hypertension) 10/13/2016  ? Abdominal pain 08/28/2016  ? Pulmonary edema 07/03/2016  ? Fluid overload 04/12/2016  ? Chest pain 04/11/2016  ? Scrotal infection 01/25/2015  ? ESRD (end stage renal disease) on dialysis ([place]) 01/25/2015  ? ? ? ?Physical Exam  ?Triage Vital Signs: ?ED Triage Vitals  ?Enc Vitals Group  ?   BP 10/15/21 1417 (!) 177/91  ?   Pulse Rate 10/15/21 1417 65  ?   Resp 10/15/21 1417 20  ?   Temp 10/15/21 1417 100 ?F (37.8 ?C)  ?   Temp Source 10/15/21 1417 Oral  ?   SpO2 10/15/21 1417 99 %  ?   Weight 10/15/21 1418 143 lb 4.8 oz (65 kg)  ?   Height 10/15/21 1418 '5\' 6"'$  (1.676 m)  ?   Head Circumference --   ?   Peak Flow --   ?  Pain Score 10/15/21 1418 10  ?   Pain Loc --   ?   Pain Edu? --   ?   Excl. in [place]? --   ? ? ?Most recent vital signs: ?Vitals:  ? 10/15/21 2230 10/15/21 2300  ?BP: (!) 167/57 (!) 145/59  ?Pulse: 70 66  ?Resp: 15 14  ?Temp:    ?SpO2: 98% 98%  ? ? ? ?General: Awake, no distress.  ?CV:  Good peripheral perfusion. No edema ?Resp:  Normal effort. Lungs are clear ?Abd:  No distention. Mild ttp in epigastric region  ?Neuro:             Awake, Alert, Oriented x 3  ?Other:  Aox3, nml speech  ?PERRL, EOMI, face symmetric, nml tongue movement  ?5/5 strength in the BL upper and lower extremities  ?Sensation grossly intact in the BL upper and lower  extremities  ?Finger-nose-finger intact BL ? ?Moist MM ?Neck is supple, no meningismus  ? ? ?ED Results / Procedures / Treatments  ?Labs ?(all labs ordered are listed, but only abnormal results are displayed) ?Labs Reviewed  ?BASIC METABOLIC PANEL - Abnormal; Notable for the following components:  ?    Result Value  ? Chloride 96 (*)   ? Glucose, Bld 131 (*)   ? BUN 80 (*)   ? Creatinine, Ser 8.96 (*)   ? Calcium 8.0 (*)   ? GFR, Estimated 7 (*)   ? All other components within normal limits  ?CBC - Abnormal; Notable for the following components:  ? WBC 3.8 (*)   ? RBC 3.74 (*)   ? Hemoglobin 12.3 (*)   ? MCV 104.3 (*)   ? Platelets 81 (*)   ? All other components within normal limits  ?PROTIME-INR - Abnormal; Notable for the following components:  ? Prothrombin Time 16.8 (*)   ? INR 1.4 (*)   ? All other components within normal limits  ?HEPATIC FUNCTION PANEL - Abnormal; Notable for the following components:  ? Alkaline Phosphatase 145 (*)   ? All other components within normal limits  ?CBC WITH DIFFERENTIAL/PLATELET - Abnormal; Notable for the following components:  ? WBC 3.8 (*)   ? RBC 3.74 (*)   ? Hemoglobin 12.3 (*)   ? MCV 104.5 (*)   ? Platelets 80 (*)   ? Lymphs Abs 0.6 (*)   ? All other components within normal limits  ?BODY FLUID CELL COUNT WITH DIFFERENTIAL - Abnormal; Notable for the following components:  ? Color, Fluid YELLOW (*)   ? Appearance, Fluid HAZY (*)   ? All other components within normal limits  ?TROPONIN I (HIGH SENSITIVITY) - Abnormal; Notable for the following components:  ? Troponin I (High Sensitivity) 29 (*)   ? All other components within normal limits  ?TROPONIN I (HIGH SENSITIVITY) - Abnormal; Notable for the following components:  ? Troponin I (High Sensitivity) 30 (*)   ? All other components within normal limits  ?RESP PANEL BY RT-PCR (FLU A&B, COVID) ARPGX2  ?BODY FLUID CULTURE W GRAM STAIN  ?CULTURE, BLOOD (ROUTINE X 2)  ?CULTURE, BLOOD (ROUTINE X 2)  ?LACTIC ACID, PLASMA   ?LACTIC ACID, PLASMA  ?LIPASE, BLOOD  ?PROCALCITONIN  ?AMMONIA  ?PROTEIN, BODY FLUID (OTHER)  ?GLUCOSE, BODY FLUID OTHER            ?HIV ANTIBODY (ROUTINE TESTING W REFLEX)  ?BASIC METABOLIC PANEL  ?MAGNESIUM  ?PHOSPHORUS  ?CBC WITH DIFFERENTIAL/PLATELET  ?PROTIME-INR  ?CORTISOL-AM, BLOOD  ?PATHOLOGIST SMEAR REVIEW  ? ? ? ?  EKG ? ?EKG reviewed by myself, RAD, NSR, no ischemic changes ? ? ?RADIOLOGY ?I reviewed pts CXR, increased interstitial markings  ? ? ?PROCEDURES: ? ?Critical Care performed: No ? ?.Paracentesis ? ?Date/Time: 10/16/2021 2:25 AM ?Performed by: Rada Hay, MD ?Authorized by: Rada Hay, MD  ? ?Consent:  ?  Consent obtained:  Verbal ?  Consent given by:  Patient ?  Risks discussed:  Bleeding and pain ?Pre-procedure details:  ?  Procedure purpose:  Diagnostic ?Anesthesia:  ?  Anesthesia method:  Local infiltration ?  Local anesthetic:  Lidocaine 1% WITH epi ?Procedure details:  ?  Needle gauge:  18 ?  Ultrasound guidance: no   ?  Puncture site:  R lower quadrant ?  Fluid removed amount:  20 ?  Fluid appearance:  Serous ?  Dressing:  4x4 sterile gauze ?Post-procedure details:  ?  Procedure completion:  Tolerated ? ?The patient is on the cardiac monitor to evaluate for evidence of arrhythmia and/or significant heart rate changes. ? ? ?MEDICATIONS ORDERED IN ED: ?Medications  ?acetaminophen (TYLENOL) tablet 650 mg (has no administration in time range)  ?  Or  ?acetaminophen (TYLENOL) suppository 650 mg (has no administration in time range)  ?ondansetron [facility]) tablet 4 mg ( Oral See Alternative 10/15/21 2027)  ?  Or  ?ondansetron [facility]) injection 4 mg (4 mg Intravenous Given 10/15/21 2027)  ?heparin injection 5,000 Units (has no administration in time range)  ?cefTRIAXone (ROCEPHIN) 2 g in sodium chloride 0.9 % 100 mL IVPB (has no administration in time range)  ?hydrALAZINE (APRESOLINE) injection 5 mg (has no administration in time range)  ?nicotine (NICODERM CQ - dosed in mg/24 hours)  patch 14 mg (has no administration in time range)  ?acetaminophen (TYLENOL) tablet 1,000 mg (1,000 mg Oral Given 10/15/21 1713)  ?ondansetron (ZOFRAN-ODT) disintegrating tablet 4 mg (4 mg Oral Given 10/15/21 171

## 2021-10-15 NOTE — Assessment & Plan Note (Signed)
-   Neprology is been consulted via staff message, to Dr. Holley Raring ?

## 2021-10-15 NOTE — Assessment & Plan Note (Signed)
-   Blood cultures x 2 and sbp fluids collected and sent to lab  ?

## 2021-10-15 NOTE — ED Triage Notes (Signed)
Pt reports that he is having SHOB, headache and fever and has not been eating for four days. He states that he hasnt been feeling hungry just wants to drink water. He is complaining of a headache, fever and bodyaches. Last time he had dialysis was Saturday ?

## 2021-10-16 DIAGNOSIS — R531 Weakness: Secondary | ICD-10-CM

## 2021-10-16 DIAGNOSIS — R188 Other ascites: Secondary | ICD-10-CM | POA: Diagnosis present

## 2021-10-16 DIAGNOSIS — R112 Nausea with vomiting, unspecified: Secondary | ICD-10-CM | POA: Diagnosis present

## 2021-10-16 DIAGNOSIS — N186 End stage renal disease: Secondary | ICD-10-CM | POA: Diagnosis present

## 2021-10-16 LAB — HIV ANTIBODY (ROUTINE TESTING W REFLEX): HIV Screen 4th Generation wRfx: NONREACTIVE

## 2021-10-16 LAB — CBC WITH DIFFERENTIAL/PLATELET
Abs Immature Granulocytes: 0.03 10*3/uL (ref 0.00–0.07)
Basophils Absolute: 0.1 10*3/uL (ref 0.0–0.1)
Basophils Relative: 1 %
Eosinophils Absolute: 0.1 10*3/uL (ref 0.0–0.5)
Eosinophils Relative: 2 %
HCT: 36.3 % — ABNORMAL LOW (ref 39.0–52.0)
Hemoglobin: 11.7 g/dL — ABNORMAL LOW (ref 13.0–17.0)
Immature Granulocytes: 1 %
Lymphocytes Relative: 15 %
Lymphs Abs: 0.7 10*3/uL (ref 0.7–4.0)
MCH: 33 pg (ref 26.0–34.0)
MCHC: 32.2 g/dL (ref 30.0–36.0)
MCV: 102.3 fL — ABNORMAL HIGH (ref 80.0–100.0)
Monocytes Absolute: 0.4 10*3/uL (ref 0.1–1.0)
Monocytes Relative: 9 %
Neutro Abs: 3.4 10*3/uL (ref 1.7–7.7)
Neutrophils Relative %: 72 %
Platelets: 77 10*3/uL — ABNORMAL LOW (ref 150–400)
RBC: 3.55 MIL/uL — ABNORMAL LOW (ref 4.22–5.81)
RDW: 14 % (ref 11.5–15.5)
WBC: 4.7 10*3/uL (ref 4.0–10.5)
nRBC: 0 % (ref 0.0–0.2)

## 2021-10-16 LAB — BASIC METABOLIC PANEL
Anion gap: 18 — ABNORMAL HIGH (ref 5–15)
BUN: 84 mg/dL — ABNORMAL HIGH (ref 6–20)
CO2: 22 mmol/L (ref 22–32)
Calcium: 8.3 mg/dL — ABNORMAL LOW (ref 8.9–10.3)
Chloride: 95 mmol/L — ABNORMAL LOW (ref 98–111)
Creatinine, Ser: 9.92 mg/dL — ABNORMAL HIGH (ref 0.61–1.24)
GFR, Estimated: 6 mL/min — ABNORMAL LOW (ref >60)
Glucose, Bld: 86 mg/dL (ref 70–99)
Potassium: 5.3 mmol/L — ABNORMAL HIGH (ref 3.5–5.1)
Sodium: 135 mmol/L (ref 135–145)

## 2021-10-16 LAB — PATHOLOGIST SMEAR REVIEW

## 2021-10-16 LAB — MAGNESIUM: Magnesium: 3.1 mg/dL — ABNORMAL HIGH (ref 1.7–2.4)

## 2021-10-16 LAB — PHOSPHORUS: Phosphorus: 9.3 mg/dL — ABNORMAL HIGH (ref 2.5–4.6)

## 2021-10-16 LAB — PROTIME-INR
INR: 1.4 — ABNORMAL HIGH (ref 0.8–1.2)
Prothrombin Time: 16.9 seconds — ABNORMAL HIGH (ref 11.4–15.2)

## 2021-10-16 LAB — CORTISOL-AM, BLOOD: Cortisol - AM: 13.6 ug/dL (ref 6.7–22.6)

## 2021-10-16 LAB — HEPATITIS B SURFACE ANTIGEN: Hepatitis B Surface Ag: NONREACTIVE

## 2021-10-16 LAB — HEPATITIS B SURFACE ANTIBODY,QUALITATIVE: Hep B S Ab: REACTIVE — AB

## 2021-10-16 MED ORDER — MORPHINE SULFATE (PF) 2 MG/ML IV SOLN
2.0000 mg | INTRAVENOUS | Status: AC | PRN
  Administered 2021-10-16 (×2): 2 mg via INTRAVENOUS
  Filled 2021-10-16 (×2): qty 1

## 2021-10-16 MED ORDER — OXYCODONE-ACETAMINOPHEN 5-325 MG PO TABS
1.0000 | ORAL_TABLET | Freq: Four times a day (QID) | ORAL | Status: AC | PRN
  Administered 2021-10-16 (×2): 1 via ORAL
  Filled 2021-10-16: qty 1

## 2021-10-16 MED ORDER — SODIUM CHLORIDE 0.9 % IV SOLN: 100.0000 mL | INTRAVENOUS | Status: DC | PRN

## 2021-10-16 MED ORDER — ALTEPLASE 2 MG IJ SOLR
2.0000 mg | Freq: Once | INTRAMUSCULAR | Status: DC | PRN
  Filled 2021-10-16: qty 2

## 2021-10-16 MED ORDER — LIDOCAINE-PRILOCAINE 2.5-2.5 % EX CREA: 1.0000 "application " | TOPICAL_CREAM | CUTANEOUS | Status: DC | PRN

## 2021-10-16 MED ORDER — CHLORHEXIDINE GLUCONATE CLOTH 2 % EX PADS
6.0000 | MEDICATED_PAD | Freq: Every day | CUTANEOUS | Status: DC
  Administered 2021-10-17 – 2021-10-18 (×2): 6 via TOPICAL
  Filled 2021-10-16: qty 6

## 2021-10-16 MED ORDER — LIDOCAINE HCL (PF) 1 % IJ SOLN: 5.0000 mL | INTRAMUSCULAR | Status: DC | PRN

## 2021-10-16 MED ORDER — HEPARIN SODIUM (PORCINE) 1000 UNIT/ML DIALYSIS
1000.0000 [IU] | INTRAMUSCULAR | Status: DC | PRN
  Administered 2021-10-16: 1000 [IU] via INTRAVENOUS_CENTRAL
  Filled 2021-10-16 (×2): qty 1

## 2021-10-16 MED ORDER — SODIUM CHLORIDE 0.9 % IV SOLN: INTRAVENOUS | Status: DC | PRN

## 2021-10-16 MED ORDER — PENTAFLUOROPROP-TETRAFLUOROETH EX AERO
1.0000 "application " | INHALATION_SPRAY | CUTANEOUS | Status: DC | PRN
  Filled 2021-10-16: qty 30

## 2021-10-16 MED ORDER — OXYCODONE HCL 5 MG PO TABS
ORAL_TABLET | ORAL | Status: AC
  Filled 2021-10-16: qty 1

## 2021-10-16 NOTE — Assessment & Plan Note (Addendum)
Patient also had nausea and vomiting.  CT of the abdomen pelvis did not show any evidence for bowel obstruction.  Ascites likely contributing.  Patient improved after therapeutic paracentesis. ?

## 2021-10-16 NOTE — Assessment & Plan Note (Addendum)
Dialyzed on Tuesday Thursday Saturday schedule.  Missed dialysis on 3/14.  Seen by nephrology and underwent multiple dialysis sessions. ?

## 2021-10-16 NOTE — ED Notes (Signed)
Patient is dry heaving and reports pain. Will review orders ?

## 2021-10-16 NOTE — ED Notes (Signed)
Pt given meal tray.

## 2021-10-16 NOTE — Assessment & Plan Note (Addendum)
Diagnostic paracentesis was done.  Cell counts not high enough to make a diagnosis of SBP.  No organisms or WBC noted on Gram stain.  Patient was empirically started on ceftriaxone.  We will continue for now.  Procalcitonin is noted to be elevated at 1.16.  However significance of this is unclear in the setting of end-stage renal disease.  Cultures negative.  ?

## 2021-10-16 NOTE — Progress Notes (Signed)
? ?[facility] ?PROGRESS NOTE ? ? ?Dave Mergen JXB:[id number] DOB: November 10, 1970 DOA: 10/15/2021  0 ?DOS: the patient was seen and examined on 10/16/2021 ? ?PCP: [name], MD ? ?Brief History and Hospital Course:  ?51 year old male with history of ESRD on HD TThSa, known chronic liver cirrhosis, portal hypertension, hypertension, neuropathy, who presented to the ED for chief concern of weakness and shortness of breath.  Patient was complaining of abdominal pain with nausea and vomiting.  There was concern for SBP.  Diagnostic paracentesis was done.  There was also some concern for fluid overload.  He apparently missed dialysis session yesterday because he was feeling poorly.  Patient was hospitalized for further management. ? ?Consultants: Nephrology ? ?Procedures:  Diagnostic paracentesis.  Hemodialysis ? ? ? ?Subjective: ?Patient had nausea vomiting earlier this morning.  Continues to complain of abdominal pain. ? ? ? ?Assessment/Plan: ? ? ?Generalized weakness ?Etiology is unclear.  There was concern for some kind of infection.  Patient was started on ceftriaxone for empiric treatment of SBP.  Continue to monitor.  Fluid overload is likely contributing as well. ? ?Ascites ?Diagnostic paracentesis was done.  Cell counts not high enough to make a diagnosis of SBP.  No organisms or WBC noted on Gram stain.  Patient was empirically started on ceftriaxone.  We will continue for now.  Procalcitonin is noted to be elevated at 1.16.  Significance is unclear.  ? ?Cirrhosis ([place]) ?See above under ascites.  Patient also has portal hypertension. ? ?Abdominal pain ?Patient also had nausea and vomiting.  CT of the abdomen pelvis did not show any evidence for bowel obstruction.  Does have large amount of ascites which could be contributing.  He may need therapeutic paracentesis. ? ?ESRD (end stage renal disease) on dialysis [facility]) ?Usually dialyzed on a Tuesday Thursday Saturday schedule.  Missed dialysis  yesterday as he was feeling poorly.  Nephrology consulted.  To be dialyzed today. ? ?HTN (hypertension) ?Blood pressure noted to be elevated this morning.  To be dialyzed today.  Home medication list suggest that he is on amlodipine.  This can be resumed. ? ?Chronic diastolic (congestive) heart failure (HCC) ?Volume being managed by hemodialysis. ? ?Splenic infarction ?History of splenic infarct, with documented history of not taking his Eliquis. Patient currently not taking Eliquis ? ?Anemia in ESRD (end-stage renal disease) ([place]) ?Hemoglobin low but stable.  No evidence for overt blood loss. ? ?Tobacco abuse ?Counseled. ? ? ? ?DVT Prophylaxis: Subcutaneous heparin ?Code Status: Full code ?Family Communication: Discussed with patient.  No family at bedside ?Disposition Plan: Hopefully return home when improved ? ?Status is: Observation ?The patient will require care spanning > 2 midnights and should be moved to inpatient because: Continues to have nausea vomiting with abdominal pain. ? ? ? ? ?Medications: Scheduled: ? Chlorhexidine Gluconate Cloth  6 each Topical Q0600  ? heparin  5,000 Units Subcutaneous Q8H  ? ?Continuous: ? sodium chloride    ? sodium chloride    ? cefTRIAXone (ROCEPHIN)  IV    ? ?ZHY:QMVHQI chloride, sodium chloride, acetaminophen **OR** acetaminophen, alteplase, heparin, hydrALAZINE, lidocaine (PF), lidocaine-prilocaine, nicotine, ondansetron **OR** ondansetron (ZOFRAN) IV, oxyCODONE-acetaminophen, pentafluoroprop-tetrafluoroeth ? ?Antibiotics: ?Anti-infectives (From admission, onward)  ? ? Start     Dose/Rate Route Frequency Ordered Stop  ? 10/16/21 2000  cefTRIAXone (ROCEPHIN) 2 g in sodium chloride 0.9 % 100 mL IVPB       ? 2 g ?200 mL/hr over 30 Minutes Intravenous Every 24 hours 10/15/21 1946  10/22/21 1959  ? 10/15/21 2000  cefTRIAXone (ROCEPHIN) 2 g in sodium chloride 0.9 % 100 mL IVPB  Status:  Discontinued       ? 2 g ?200 mL/hr over 30 Minutes Intravenous Every 24 hours 10/15/21 1946  10/15/21 1946  ? 10/15/21 1945  cefTRIAXone (ROCEPHIN) 2 g in sodium chloride 0.9 % 100 mL IVPB       ? 2 g ?200 mL/hr over 30 Minutes Intravenous  Once 10/15/21 1943 10/15/21 2108  ? 10/15/21 1930  cefTRIAXone (ROCEPHIN) 1 g in sodium chloride 0.9 % 100 mL IVPB  Status:  Discontinued       ? 1 g ?200 mL/hr over 30 Minutes Intravenous  Once 10/15/21 1922 10/15/21 1943  ? ?  ? ? ?Objective: ? ?Vital Signs ? ?Vitals:  ? 10/16/21 0600 10/16/21 0630 10/16/21 0700 10/16/21 0830  ?BP: (!) 162/61 (!) 170/73 (!) 156/60 (!) 153/84  ?Pulse: 64 63    ?Resp: 17 14    ?Temp:      ?TempSrc:      ?SpO2: 95% 98%    ?Weight:      ?Height:      ? ? ?Intake/Output Summary (Last 24 hours) at 10/16/2021 0947 ?Last data filed at 10/15/2021 2108 ?Gross per 24 hour  ?Intake 100 ml  ?Output --  ?Net 100 ml  ? ?Filed Weights  ? 10/15/21 1418  ?Weight: 65 kg  ? ? ?General appearance: Awake alert.  In no distress ?Resp: Clear to auscultation bilaterally.  Normal effort ?Cardio: S1-S2 is normal regular.  No S3-S4.  No rubs murmurs or bruit ?GI: Abdomen is distended.  Tenderness.  Diffusely tender without any rebound rigidity or guarding.  No masses organomegaly.  Bowel sounds sluggish but present. ?Extremities: No edema.  Full range of motion of lower extremities. ?Neurologic: No focal neurological deficits.  ? ? ?Lab Results: ? ?Data Reviewed: I have personally reviewed labs and imaging study reports ? ?CBC: ?Recent Labs  ?Lab 10/15/21 ?1433 10/16/21 ?0610  ?WBC 3.8*  3.8* 4.7  ?NEUTROABS 2.8 3.4  ?HGB 12.3*  12.3* 11.7*  ?HCT 39.1  39.0 36.3*  ?MCV 104.5*  104.3* 102.3*  ?PLT 80*  81* 77*  ? ? ?Basic Metabolic Panel: ?Recent Labs  ?Lab 10/15/21 ?1433 10/16/21 ?0610  ?NA 135 135  ?K 5.0 5.3*  ?CL 96* 95*  ?CO2 24 22  ?GLUCOSE 131* 86  ?BUN 80* 84*  ?CREATININE 8.96* 9.92*  ?CALCIUM 8.0* 8.3*  ?MG  --  3.1*  ?PHOS  --  9.3*  ? ? ?GFR: ?Estimated Creatinine Clearance (by C-G formula based on SCr of 9.92 mg/dL (H)) ?Male: 6.3 mL/min  (A) ?Male: 7.9 mL/min (A) ? ?Liver Function Tests: ?Recent Labs  ?Lab 10/15/21 ?1433  ?AST 30  ?ALT 18  ?ALKPHOS 145*  ?BILITOT 1.0  ?PROT 7.0  ?ALBUMIN 3.5  ? ? ?Recent Labs  ?Lab 10/15/21 ?1433  ?LIPASE 39  ? ?Recent Labs  ?Lab 10/15/21 ?1959  ?AMMONIA 30  ? ? ?Coagulation Profile: ?Recent Labs  ?Lab 10/15/21 ?1433 10/16/21 ?0610  ?INR 1.4* 1.4*  ? ? ? ? ?Recent Results (from the past 240 hour(s))  ?Resp Panel by RT-PCR (Flu A&B, Covid) Nasopharyngeal Swab     Status: None  ? Collection Time: 10/15/21  2:33 PM  ? Specimen: Nasopharyngeal Swab; Nasopharyngeal(NP) swabs in vial transport medium  ?Result Value Ref Range Status  ? SARS Coronavirus 2 by RT PCR NEGATIVE NEGATIVE Final  ?  Comment: (NOTE) ?  SARS-CoV-2 target nucleic acids are NOT DETECTED. ? ?The SARS-CoV-2 RNA is generally detectable in upper respiratory ?specimens during the acute phase of infection. The lowest ?concentration of SARS-CoV-2 viral copies this assay can detect is ?138 copies/mL. A negative result does not preclude SARS-Cov-2 ?infection and should not be used as the sole basis for treatment or ?other patient management decisions. A negative result may occur with  ?improper specimen collection/handling, submission of specimen other ?than nasopharyngeal swab, presence of viral mutation(s) within the ?areas targeted by this assay, and inadequate number of viral ?copies(<138 copies/mL). A negative result must be combined with ?clinical observations, patient history, and epidemiological ?information. The expected result is Negative. ? ?Fact Sheet for Patients:  ?EntrepreneurPulse.com.au ? ?Fact Sheet for Healthcare Providers:  ?IncredibleEmployment.be ? ?This test is no t yet approved or cleared by the [place] FDA and  ?has been authorized for detection and/or diagnosis of SARS-CoV-2 by ?FDA under an Emergency Use Authorization (EUA). This EUA will remain  ?in effect (meaning this test can be used) for  the duration of the ?COVID-19 declaration under Section 564(b)(1) of the Act, 21 ?U.S.C.section 360bbb-3(b)(1), unless the authorization is terminated  ?or revoked sooner.  ? ? ?  ? Influenza A by PCR NEGATIVE NEGATI

## 2021-10-16 NOTE — Assessment & Plan Note (Signed)
Counseled

## 2021-10-16 NOTE — Assessment & Plan Note (Signed)
Hemoglobin low but stable.  No evidence for overt blood loss. ?

## 2021-10-16 NOTE — Assessment & Plan Note (Signed)
Volume being managed by hemodialysis. ?

## 2021-10-16 NOTE — Assessment & Plan Note (Addendum)
History of splenic infarct, with documented history of not taking his Eliquis. Patient currently not taking Eliquis ?

## 2021-10-16 NOTE — Progress Notes (Signed)
?[facility]  ?ROUNDING NOTE  ? ?Subjective:  ? ?Mario [name] is a 51 year old Hispanic male with past medical history including hypertension, neuropathy, chronic liver cirrhosis, portal hypertension, and end-stage renal disease on hemodialysis.  Patient presents to the emergency department with complaints of muscle aches and weakness.  Patient has been admitted for Generalized abdominal pain [R10.84] ?SBP (spontaneous bacterial peritonitis) ([place]) [K65.2] ?Sepsis ([place]) [A41.9] ?Fever, unspecified fever cause [R50.9] ?ESRD (end stage renal disease) ([place]) [N18.6]  ? ?Patient is known to our practice and receives outpatient dialysis treatments at [facility] on a TTS schedule, overseen by [employer].  Patient admits to missing treatment on Tuesday, states he was not feeling well.  Patient states weakness began gradually over the past couple days.  He reports body aches, fever and chills began about 5 days ago.  Denies sick contacts.  Does also report some nausea without vomiting or diarrhea.  Reports nonproductive cough.  Reports shortness of breath, remains on room air satting mid to upper 90s ? ?Labs on arrival include glucose 131, BUN 80, creatinine 8.96 with GFR 7.  Chest x-ray suggest mild CHF and mild pulmonary edema or interstitial pneumonia.  CT abdomen shows a large ascites due to hepatic cirrhosis.  Respiratory panel negative for influenza and COVID-19. ? ?We have been consulted to manage dialysis needs during this admission ? ? ?Objective:  ?Vital signs in last 24 hours:  ?Temp:  [97.8 ?F (36.6 ?C)-100 ?F (37.8 ?C)] 97.8 ?F (36.6 ?C) (03/15 3785) ?Pulse Rate:  [62-82] 63 (03/15 0630) ?Resp:  [14-23] 20 (03/15 1245) ?BP: (126-193)/(53-109) 137/60 (03/15 1245) ?SpO2:  [95 %-100 %] 100 % (03/15 1215) ?Weight:  [65 kg] 65 kg (03/14 1418) ? ?Weight change:  ?Filed Weights  ? 10/15/21 1418  ?Weight: 65 kg  ? ? ?Intake/Output: ?I/O last 3 completed shifts: ?In: 100 [IV  Piggyback:100] ?Out: -  ?  ?Intake/Output this shift: ? No intake/output data recorded. ? ?Physical Exam: ?General: NAD, resting quietly  ?Head: Normocephalic, atraumatic. Moist oral mucosal membranes  ?Eyes: Anicteric  ?Neck: Supple  ?Lungs:  Clear to auscultation, normal effort  ?Heart: Regular rate and rhythm  ?Abdomen:  Soft, nontender, distended  ?Extremities: 1+ peripheral edema.  ?Neurologic: Nonfocal, moving all four extremities  ?Skin: No lesions  ?Access: Left aVF  ? ? ?Basic Metabolic Panel: ?Recent Labs  ?Lab 10/15/21 ?1433 10/16/21 ?0610  ?NA 135 135  ?K 5.0 5.3*  ?CL 96* 95*  ?CO2 24 22  ?GLUCOSE 131* 86  ?BUN 80* 84*  ?CREATININE 8.96* 9.92*  ?CALCIUM 8.0* 8.3*  ?MG  --  3.1*  ?PHOS  --  9.3*  ? ? ?Liver Function Tests: ?Recent Labs  ?Lab 10/15/21 ?1433  ?AST 30  ?ALT 18  ?ALKPHOS 145*  ?BILITOT 1.0  ?PROT 7.0  ?ALBUMIN 3.5  ? ?Recent Labs  ?Lab 10/15/21 ?1433  ?LIPASE 39  ? ?Recent Labs  ?Lab 10/15/21 ?1959  ?AMMONIA 30  ? ? ?CBC: ?Recent Labs  ?Lab 10/15/21 ?1433 10/16/21 ?0610  ?WBC 3.8*  3.8* 4.7  ?NEUTROABS 2.8 3.4  ?HGB 12.3*  12.3* 11.7*  ?HCT 39.1  39.0 36.3*  ?MCV 104.5*  104.3* 102.3*  ?PLT 80*  81* 77*  ? ? ?Cardiac Enzymes: ?No results for input(s): CKTOTAL, CKMB, CKMBINDEX, TROPONINI in the last 168 hours. ? ?BNP: ?Invalid input(s): POCBNP ? ?CBG: ?No results for input(s): GLUCAP in the last 168 hours. ? ?Microbiology: ?Results for orders placed or performed during the hospital  encounter of 10/15/21  ?Resp Panel by RT-PCR (Flu A&B, Covid) Nasopharyngeal Swab     Status: None  ? Collection Time: 10/15/21  2:33 PM  ? Specimen: Nasopharyngeal Swab; Nasopharyngeal(NP) swabs in vial transport medium  ?Result Value Ref Range Status  ? SARS Coronavirus 2 by RT PCR NEGATIVE NEGATIVE Final  ?  Comment: (NOTE) ?SARS-CoV-2 target nucleic acids are NOT DETECTED. ? ?The SARS-CoV-2 RNA is generally detectable in upper respiratory ?specimens during the acute phase of infection. The  lowest ?concentration of SARS-CoV-2 viral copies this assay can detect is ?138 copies/mL. A negative result does not preclude SARS-Cov-2 ?infection and should not be used as the sole basis for treatment or ?other patient management decisions. A negative result may occur with  ?improper specimen collection/handling, submission of specimen other ?than nasopharyngeal swab, presence of viral mutation(s) within the ?areas targeted by this assay, and inadequate number of viral ?copies(<138 copies/mL). A negative result must be combined with ?clinical observations, patient history, and epidemiological ?information. The expected result is Negative. ? ?Fact Sheet for Patients:  ?EntrepreneurPulse.com.au ? ?Fact Sheet for Healthcare Providers:  ?IncredibleEmployment.be ? ?This test is no t yet approved or cleared by the [place] FDA and  ?has been authorized for detection and/or diagnosis of SARS-CoV-2 by ?FDA under an Emergency Use Authorization (EUA). This EUA will remain  ?in effect (meaning this test can be used) for the duration of the ?COVID-19 declaration under Section 564(b)(1) of the Act, 21 ?U.S.C.section 360bbb-3(b)(1), unless the authorization is terminated  ?or revoked sooner.  ? ? ?  ? Influenza A by PCR NEGATIVE NEGATIVE Final  ? Influenza B by PCR NEGATIVE NEGATIVE Final  ?  Comment: (NOTE) ?The Xpert Xpress SARS-CoV-2/FLU/RSV plus assay is intended as an aid ?in the diagnosis of influenza from Nasopharyngeal swab specimens and ?should not be used as a sole basis for treatment. Nasal washings and ?aspirates are unacceptable for Xpert Xpress SARS-CoV-2/FLU/RSV ?testing. ? ?Fact Sheet for Patients: ?EntrepreneurPulse.com.au ? ?Fact Sheet for Healthcare Providers: ?IncredibleEmployment.be ? ?This test is not yet approved or cleared by the [place] FDA and ?has been authorized for detection and/or diagnosis of SARS-CoV-2 by ?FDA under  an Emergency Use Authorization (EUA). This EUA will remain ?in effect (meaning this test can be used) for the duration of the ?COVID-19 declaration under Section 564(b)(1) of the Act, 21 U.S.C. ?section 360bbb-3(b)(1), unless the authorization is terminated or ?revoked. ? ?Performed at [facility], [place], ?[place] [address] ?  ?Body fluid culture w Gram Stain     Status: None (Preliminary result)  ? Collection Time: 10/15/21  7:22 PM  ? Specimen: Peritoneal Washings; Body Fluid  ?Result Value Ref Range Status  ? Specimen Description   Final  ?  PERITONEAL ?Performed at [facility], [address] [place], [place] [address] ?  ? Special Requests   Final  ?  PERITONEAL ?Performed at [facility], [address], [place], [place] [address] ?  ? Gram Stain   Final  ?  NO SQUAMOUS EPITHELIAL CELLS SEEN ?NO WBC SEEN ?NO ORGANISMS SEEN ?  ? Culture   Final  ?  NO GROWTH < 12 HOURS ?Performed at [facility], [place] [address], [place], [place] [address] ?  ? Report Status PENDING  Incomplete  ?Blood culture (routine x 2)     Status: None (Preliminary result)  ? Collection Time: 10/15/21  7:33 PM  ? Specimen: BLOOD  ?Result Value Ref  Range Status  ? Specimen Description BLOOD RIGHT ASSIST CONTROL  Final  ? Special Requests   Final  ?  BOTTLES DRAWN AEROBIC AND ANAEROBIC Blood Culture adequate volume  ? Culture   Final  ?  NO GROWTH < 12 HOURS ?Performed at [facility], [address], [place], [place] [address] ?  ? Report Status PENDING  Incomplete  ?Blood culture (routine x 2)     Status: None (Preliminary result)  ? Collection Time: 10/15/21  7:33 PM  ? Specimen: BLOOD  ?Result Value Ref Range Status  ? Specimen Description BLOOD RIGHT HAND  Final  ? Special Requests   Final  ?  BOTTLES DRAWN AEROBIC AND ANAEROBIC Blood Culture adequate volume  ? Culture   Final  ?  NO GROWTH < 12 HOURS ?Performed at [facility], [address], [place],  [place] [address] ?  ? Report Status PENDING  Incomplete  ? ? ?Coagulation Studies: ?Recent Labs  ?  10/15/21 ?1433 10/16/21 ?0610  ?LABPROT 16.8* 16.9*  ?INR 1.4* 1.4*  ? ? ?Urinalysis: ?No results for input(s): COLORURINE, LABSPEC,

## 2021-10-16 NOTE — Assessment & Plan Note (Addendum)
-   Continue home medications 

## 2021-10-16 NOTE — ED Notes (Signed)
Patient reports abd pain exhibited by facial grimacing and moaning. Dr. Tobie Poet notified and awaiting orders. ?

## 2021-10-16 NOTE — Assessment & Plan Note (Signed)
See above under ascites.  Patient also has portal hypertension. ?

## 2021-10-16 NOTE — Progress Notes (Signed)
Post Hemodialysis Treatment Note ? ?Tx Date: October 16, 2021 ? ?Tx Time: 3 hours scheduled  ? ?Access: Left Upper Arm Fistula ? ?UF Removed: 1951 ? ?Next Scheduled Tx: 17 October 2021 Inpatient ? ?Note:  ?Pt is added to HD schedule for treatment having missed several sessions at chronic clinic. Pt. states he was too sick with body aches to attend. Pt. appears disheveled, not in any acute distress, rates pain 10/10, given PRN dose of pain medication.  Assessment completed with the assistance of the medical interpreter. LUA AVF cannulated without difficulty, prescribed BFR maintained throughout treatment. Targeted UF unmet, patient terminated treatment prior to meeting UF goal, stating he was hungry and wanted food. Pt. had been given snack earlier, threw on floor.  Minimal bleeding post deaccessing, vital signs stable, unable to obtain weigh Pt. on ED stretcher. Pt. informed of scheduled tx for next day.  ?

## 2021-10-16 NOTE — Assessment & Plan Note (Addendum)
Etiology is unclear.  There was concern for some kind of infection.  Patient was started on ceftriaxone for empiric treatment of SBP.  Fluid overload is likely contributing as well.  Patient started improving after he was dialyzed and underwent therapeutic paracentesis.  He was able to ambulate without difficulty.  Feels like he is back to his baseline. ?

## 2021-10-16 NOTE — Hospital Course (Signed)
51 year old male with history of ESRD on HD TThSa, known chronic liver cirrhosis, portal hypertension, hypertension, neuropathy, who presented to the ED for chief concern of weakness and shortness of breath.  Patient was complaining of abdominal pain with nausea and vomiting.  There was concern for SBP.  Diagnostic paracentesis was done.  There was also some concern for fluid overload.  He apparently missed dialysis session yesterday because he was feeling poorly.  Patient was hospitalized for further management. ?

## 2021-10-16 NOTE — ED Notes (Signed)
Report messaged to brenda rn floor nurse ?

## 2021-10-16 NOTE — ED Notes (Signed)
Report given to Dialysis RN.

## 2021-10-16 NOTE — ED Notes (Signed)
Pt taken to Dialysis, consent sent with pt and all belongings. ?

## 2021-10-17 ENCOUNTER — Inpatient Hospital Stay: Payer: Self-pay

## 2021-10-17 LAB — CBC
HCT: 35.8 % — ABNORMAL LOW (ref 39.0–52.0)
Hemoglobin: 11.7 g/dL — ABNORMAL LOW (ref 13.0–17.0)
MCH: 33 pg (ref 26.0–34.0)
MCHC: 32.7 g/dL (ref 30.0–36.0)
MCV: 100.8 fL — ABNORMAL HIGH (ref 80.0–100.0)
Platelets: 66 10*3/uL — ABNORMAL LOW (ref 150–400)
RBC: 3.55 MIL/uL — ABNORMAL LOW (ref 4.22–5.81)
RDW: 13.7 % (ref 11.5–15.5)
WBC: 3.6 10*3/uL — ABNORMAL LOW (ref 4.0–10.5)
nRBC: 0 % (ref 0.0–0.2)

## 2021-10-17 LAB — COMPREHENSIVE METABOLIC PANEL
ALT: 16 U/L (ref 0–44)
AST: 26 U/L (ref 15–41)
Albumin: 3.3 g/dL — ABNORMAL LOW (ref 3.5–5.0)
Alkaline Phosphatase: 126 U/L (ref 38–126)
Anion gap: 15 (ref 5–15)
BUN: 51 mg/dL — ABNORMAL HIGH (ref 6–20)
CO2: 24 mmol/L (ref 22–32)
Calcium: 8.2 mg/dL — ABNORMAL LOW (ref 8.9–10.3)
Chloride: 95 mmol/L — ABNORMAL LOW (ref 98–111)
Creatinine, Ser: 6.96 mg/dL — ABNORMAL HIGH (ref 0.61–1.24)
GFR, Estimated: 9 mL/min — ABNORMAL LOW (ref >60)
Glucose, Bld: 84 mg/dL (ref 70–99)
Potassium: 4.6 mmol/L (ref 3.5–5.1)
Sodium: 134 mmol/L — ABNORMAL LOW (ref 135–145)
Total Bilirubin: 1 mg/dL (ref 0.3–1.2)
Total Protein: 6.7 g/dL (ref 6.5–8.1)

## 2021-10-17 LAB — HEPATITIS B SURFACE ANTIBODY, QUANTITATIVE: Hep B S AB Quant (Post): >1000 m[IU]/mL (ref >9.9)

## 2021-10-17 LAB — GLUCOSE, BODY FLUID OTHER: Glucose, Body Fluid Other: 98 mg/dL

## 2021-10-17 LAB — PROTEIN, BODY FLUID (OTHER): Total Protein, Body Fluid Other: 3.7 g/dL

## 2021-10-17 MED ORDER — NEPRO/CARBSTEADY PO LIQD
237.0000 mL | Freq: Three times a day (TID) | ORAL | Status: DC
  Administered 2021-10-17: 237 mL via ORAL

## 2021-10-17 MED ORDER — OXYCODONE-ACETAMINOPHEN 5-325 MG PO TABS
1.0000 | ORAL_TABLET | Freq: Four times a day (QID) | ORAL | Status: DC | PRN
Start: 2021-10-17 — End: 2021-10-18
  Administered 2021-10-17 (×2): 1 via ORAL
  Filled 2021-10-17 (×4): qty 1

## 2021-10-17 MED ORDER — RENA-VITE PO TABS
1.0000 | ORAL_TABLET | Freq: Every day | ORAL | Status: DC
  Administered 2021-10-17: 1 via ORAL
  Filled 2021-10-17: qty 1

## 2021-10-17 MED ORDER — PROCHLORPERAZINE EDISYLATE 10 MG/2ML IJ SOLN
10.0000 mg | Freq: Once | INTRAMUSCULAR | Status: AC
  Administered 2021-10-17: 10 mg via INTRAVENOUS
  Filled 2021-10-17: qty 2

## 2021-10-17 MED ORDER — MELATONIN 5 MG PO TABS
5.0000 mg | ORAL_TABLET | Freq: Every day | ORAL | Status: DC
  Administered 2021-10-17: 5 mg via ORAL
  Filled 2021-10-17: qty 1

## 2021-10-17 MED ORDER — AMLODIPINE BESYLATE 5 MG PO TABS
5.0000 mg | ORAL_TABLET | Freq: Every day | ORAL | Status: DC
  Administered 2021-10-17 – 2021-10-18 (×2): 5 mg via ORAL
  Filled 2021-10-17 (×2): qty 1

## 2021-10-17 NOTE — Progress Notes (Signed)
Mobility Specialist - Progress Note ? ? 10/17/21 1359  ?Mobility  ?Activity Ambulated independently in hallway;Stood at bedside;Dangled on edge of bed  ?Level of Assistance Independent  ?Assistive Device None  ?Distance Ambulated (ft) 340 ft  ?Activity Response Tolerated well  ?$Mobility charge 1 Mobility  ? ? ?Pt at door using RA asking to ambulate, intervened by mobility tech.Pt completes ambulation indep for 2 laps ----mild SOB noted. Pt returned to bed with needs in reach and alarm set. ? ?Merrily Brittle ?Mobility Specialist ?10/17/21, 2:02 PM ? ? ? ? ?

## 2021-10-17 NOTE — Progress Notes (Addendum)
?[facility]  ?ROUNDING NOTE  ? ?Subjective:  ? ?Mario [name] is a 51 year old Hispanic male with past medical history including hypertension, neuropathy, chronic liver cirrhosis, portal hypertension, and end-stage renal disease on hemodialysis.  Patient presents to the emergency department with complaints of muscle aches and weakness.  Patient has been admitted for Generalized abdominal pain [R10.84] ?SBP (spontaneous bacterial peritonitis) ([place]) [K65.2] ?ESRD (end stage renal disease) ([place]) [N18.6] ?Ascites [R18.8] ?Sepsis ([place]) [A41.9] ?Fever, unspecified fever cause [R50.9]  ? ?Patient is known to our practice and receives outpatient dialysis treatments at [facility] on a TTS schedule, overseen by [employer].   ? ?Update:  ?Patient resting in bed ?Denies pain and discomfort ?Denies shortness of breath ? ?Afebrile overnight ? ?Objective:  ?Vital signs in last 24 hours:  ?Temp:  [97.7 ?F (36.5 ?C)-98.8 ?F (37.1 ?C)] 97.9 ?F (36.6 ?C) (03/16 0258) ?Pulse Rate:  [58-68] 67 (03/16 0821) ?Resp:  [14-23] 18 (03/16 5277) ?BP: (109-191)/(49-135) 116/70 (03/16 8242) ?SpO2:  [94 %-100 %] 100 % (03/16 3536) ? ?Weight change:  ?Filed Weights  ? 10/15/21 1418  ?Weight: 65 kg  ? ? ?Intake/Output: ?I/O last 3 completed shifts: ?In: 232.7 [I.V.:32.7; IV Piggyback:200] ?Out: 1951 [Other:1951] ?  ?Intake/Output this shift: ? No intake/output data recorded. ? ?Physical Exam: ?General: NAD, resting quietly  ?Head: Normocephalic, atraumatic. Moist oral mucosal membranes  ?Eyes: Anicteric  ?Neck: Supple  ?Lungs:  Clear to auscultation, normal effort  ?Heart: Regular rate and rhythm  ?Abdomen:  Soft, nontender, distended  ?Extremities: No peripheral edema.  ?Neurologic: Nonfocal, moving all four extremities  ?Skin: No lesions  ?Access: Left aVF  ? ? ?Basic Metabolic Panel: ?Recent Labs  ?Lab 10/15/21 ?1433 10/16/21 ?0610 10/17/21 ?1443  ?NA 135 135 134*  ?K 5.0 5.3* 4.6  ?CL 96* 95* 95*  ?CO2  '24 22 24  '$ ?GLUCOSE 131* 86 84  ?BUN 80* 84* 51*  ?CREATININE 8.96* 9.92* 6.96*  ?CALCIUM 8.0* 8.3* 8.2*  ?MG  --  3.1*  --   ?PHOS  --  9.3*  --   ? ? ? ?Liver Function Tests: ?Recent Labs  ?Lab 10/15/21 ?1433 10/17/21 ?1540  ?AST 30 26  ?ALT 18 16  ?ALKPHOS 145* 126  ?BILITOT 1.0 1.0  ?PROT 7.0 6.7  ?ALBUMIN 3.5 3.3*  ? ? ?Recent Labs  ?Lab 10/15/21 ?1433  ?LIPASE 39  ? ? ?Recent Labs  ?Lab 10/15/21 ?1959  ?AMMONIA 30  ? ? ? ?CBC: ?Recent Labs  ?Lab 10/15/21 ?1433 10/16/21 ?0610 10/17/21 ?0867  ?WBC 3.8*  3.8* 4.7 3.6*  ?NEUTROABS 2.8 3.4  --   ?HGB 12.3*  12.3* 11.7* 11.7*  ?HCT 39.1  39.0 36.3* 35.8*  ?MCV 104.5*  104.3* 102.3* 100.8*  ?PLT 80*  81* 77* 66*  ? ? ? ?Cardiac Enzymes: ?No results for input(s): CKTOTAL, CKMB, CKMBINDEX, TROPONINI in the last 168 hours. ? ?BNP: ?Invalid input(s): POCBNP ? ?CBG: ?No results for input(s): GLUCAP in the last 168 hours. ? ?Microbiology: ?Results for orders placed or performed during the hospital encounter of 10/15/21  ?Resp Panel by RT-PCR (Flu A&B, Covid) Nasopharyngeal Swab     Status: None  ? Collection Time: 10/15/21  2:33 PM  ? Specimen: Nasopharyngeal Swab; Nasopharyngeal(NP) swabs in vial transport medium  ?Result Value Ref Range Status  ? SARS Coronavirus 2 by RT PCR NEGATIVE NEGATIVE Final  ?  Comment: (NOTE) ?SARS-CoV-2 target nucleic acids are NOT DETECTED. ? ?The SARS-CoV-2 RNA is generally detectable in  upper respiratory ?specimens during the acute phase of infection. The lowest ?concentration of SARS-CoV-2 viral copies this assay can detect is ?138 copies/mL. A negative result does not preclude SARS-Cov-2 ?infection and should not be used as the sole basis for treatment or ?other patient management decisions. A negative result may occur with  ?improper specimen collection/handling, submission of specimen other ?than nasopharyngeal swab, presence of viral mutation(s) within the ?areas targeted by this assay, and inadequate number of viral ?copies(<138  copies/mL). A negative result must be combined with ?clinical observations, patient history, and epidemiological ?information. The expected result is Negative. ? ?Fact Sheet for Patients:  ?EntrepreneurPulse.com.au ? ?Fact Sheet for Healthcare Providers:  ?IncredibleEmployment.be ? ?This test is no t yet approved or cleared by the [place] FDA and  ?has been authorized for detection and/or diagnosis of SARS-CoV-2 by ?FDA under an Emergency Use Authorization (EUA). This EUA will remain  ?in effect (meaning this test can be used) for the duration of the ?COVID-19 declaration under Section 564(b)(1) of the Act, 21 ?U.S.C.section 360bbb-3(b)(1), unless the authorization is terminated  ?or revoked sooner.  ? ? ?  ? Influenza A by PCR NEGATIVE NEGATIVE Final  ? Influenza B by PCR NEGATIVE NEGATIVE Final  ?  Comment: (NOTE) ?The Xpert Xpress SARS-CoV-2/FLU/RSV plus assay is intended as an aid ?in the diagnosis of influenza from Nasopharyngeal swab specimens and ?should not be used as a sole basis for treatment. Nasal washings and ?aspirates are unacceptable for Xpert Xpress SARS-CoV-2/FLU/RSV ?testing. ? ?Fact Sheet for Patients: ?EntrepreneurPulse.com.au ? ?Fact Sheet for Healthcare Providers: ?IncredibleEmployment.be ? ?This test is not yet approved or cleared by the [place] FDA and ?has been authorized for detection and/or diagnosis of SARS-CoV-2 by ?FDA under an Emergency Use Authorization (EUA). This EUA will remain ?in effect (meaning this test can be used) for the duration of the ?COVID-19 declaration under Section 564(b)(1) of the Act, 21 U.S.C. ?section 360bbb-3(b)(1), unless the authorization is terminated or ?revoked. ? ?Performed at [facility], [place], ?[place] [address] ?  ?Body fluid culture w Gram Stain     Status: None (Preliminary result)  ? Collection Time: 10/15/21  7:22 PM  ? Specimen:  Peritoneal Washings; Body Fluid  ?Result Value Ref Range Status  ? Specimen Description   Final  ?  PERITONEAL ?Performed at [facility], [address], [place], [place] [address] ?  ? Special Requests   Final  ?  PERITONEAL ?Performed at [facility], [address] [place], [place] [address] ?  ? Gram Stain   Final  ?  NO SQUAMOUS EPITHELIAL CELLS SEEN ?NO WBC SEEN ?NO ORGANISMS SEEN ?  ? Culture   Final  ?  NO GROWTH < 12 HOURS ?Performed at [facility], [place] [address] [place], [place] [address] ?  ? Report Status PENDING  Incomplete  ?Blood culture (routine x 2)     Status: None (Preliminary result)  ? Collection Time: 10/15/21  7:33 PM  ? Specimen: BLOOD  ?Result Value Ref Range Status  ? Specimen Description BLOOD RIGHT ASSIST CONTROL  Final  ? Special Requests   Final  ?  BOTTLES DRAWN AEROBIC AND ANAEROBIC Blood Culture adequate volume  ? Culture   Final  ?  NO GROWTH 2 DAYS ?Performed at [facility], [address] [place], [place] [address] ?  ? Report Status PENDING  Incomplete  ?Blood culture (routine x 2)     Status:  None (Preliminary result)  ? Collection Time: 10/15/21  7:33 PM  ? Specimen: BLOOD  ?Result Value Ref Range Status  ? Specimen Description BLOOD RIGHT HAND  Final  ? Special Requests   Final  ?  BOTTLES DRAWN AEROBIC AND ANAEROBIC Blood Culture adequate volume  ? Culture   Final  ?  NO GROWTH 2 DAYS ?Performed at [facility], [address], [place],  [address] ?  ? Report Status PENDING  Incomplete  ? ? ?Coagulation Studies: ?Recent Labs  ?  10/15/21 ?1433 10/16/21 ?0610  ?LABPROT 16.8* 16.9*  ?INR 1.4* 1.4*  ? ? ? ?Urinalysis: ?No results for input(s): COLORURINE, LABSPEC, [place], GLUCOSEU, HGBUR, BILIRUBINUR, KETONESUR, PROTEINUR, UROBILINOGEN, NITRITE, LEUKOCYTESUR in the last 72 hours. ? ?Invalid input(s): APPERANCEUR  ? ? ?Imaging: ?CT ABDOMEN PELVIS WO CONTRAST ? ?Result Date: 10/15/2021 ?CLINICAL DATA:  Abdominal pain,  acute nonlocalized. EXAM: CT ABDOMEN AND PELVIS WITHOUT CONTRAST TECHNIQUE: Multidetector CT imaging of the abdomen and pelvis was performed following the standard protocol without IV contrast. RADIATION DOSE REDUCTION:

## 2021-10-17 NOTE — Procedures (Signed)
PROCEDURE SUMMARY: ? ?Successful US guided paracentesis from RUQ. Yielded 2.2 Liters of clear yellow fluid.  ?Near the end of the procedure, the [place] catheter was stuck via suction on a bowel loop so the suction was turned off and my attending, Dr. Maryelizabeth Kaufmann scrubbed in to assist, the [place] catheter was then injected with sterile saline to free the catheter and the catheter was removed in it's entirety under ultrasound guidance. The catheter was removed and a sterile dressing was applied. The patient did complain of some abdominal pain at the right sided access site post procedure but otherwise tolerated the procedure well without immediate post procedural complication. Post procedure ultrasound imaging was obtained without any immediate complication. Ordering provider Dr. Maryland Pink was made aware of the patient's abdominal pain and catheter being stuck, recommendation was made to monitor the patient's abdominal pain and monitor for fevers, of note the patient is currently on IV ceftriaxone that he has been receiving prior to procedure. ? ?Specimen was not sent for labs. ? ?EBL < 71m  ? ?MTsosie BillingD PA-C ?10/17/2021 ?3:52 PM ? ? ? ?

## 2021-10-17 NOTE — TOC Initial Note (Signed)
Transition of Care (TOC) - Initial/Assessment Note  ? ? ?Patient Details  ?Name: Mario [name] ?MRN: [id number] ?Date of Birth: [birth year] ? ?Transition of Care (TOC) CM/SW Contact:    ?Kerin Salen, RN ?Phone Number: ?10/17/2021, 2:01 PM ? ? ? ? ?Transition of Care Department [facility]) has reviewed patient and no TOC needs have been identified at this time. We will continue to monitor patient advancement through interdisciplinary progression rounds. If new patient transition needs arise, please place a TOC consult. ? ?               ? ? ?  ?  ? ? ?Patient Goals and CMS Choice ?  ?  ?  ? ?Expected Discharge Plan and Services ?  ?  ?  ?  ?  ?                ?  ?  ?  ?  ?  ?  ?  ?  ?  ?  ? ?Prior Living Arrangements/Services ?  ?  ?  ?       ?  ?  ?  ?  ? ?Activities of Daily Living ?Home Assistive Devices/Equipment: None ?ADL Screening (condition at time of admission) ?Patient's cognitive ability adequate to safely complete daily activities?: Yes ?Is the patient deaf or have difficulty hearing?: No ?Does the patient have difficulty seeing, even when wearing glasses/contacts?: Yes ?Does the patient have difficulty concentrating, remembering, or making decisions?: No ?Patient able to express need for assistance with ADLs?: Yes ?Does the patient have difficulty dressing or bathing?: No ?Independently performs ADLs?: Yes (appropriate for developmental age) ?Does the patient have difficulty walking or climbing stairs?: Yes ?Weakness of Legs: Both ?Weakness of Arms/Hands: None ? ?Permission Sought/Granted ?  ?  ?   ?   ?   ?   ? ?Emotional Assessment ?  ?  ?  ?  ?  ?  ? ?Admission diagnosis:  Generalized abdominal pain [R10.84] ?SBP (spontaneous bacterial peritonitis) ([place]) [K65.2] ?ESRD (end stage renal disease) ([place]) [N18.6] ?Ascites [R18.8] ?Sepsis ([place]) [A41.9] ?Fever, unspecified fever cause [R50.9] ?Patient Active Problem List  ? Diagnosis Date Noted  ? Nausea and vomiting 10/16/2021  ? Ascites 10/16/2021  ?  Generalized weakness 10/16/2021  ? Sepsis ([place]) 10/15/2021  ? Encounter to establish care 05/03/2020  ? Near syncope 04/15/2020  ? Soft tissue mass in left temple area   ? 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  ? Tobacco abuse 04/16/2019  ? Anemia in ESRD (end-stage renal disease) ([place]) 04/16/2019  ? Neck pain, bilateral posterior 04/16/2019  ? Cervical spinal stenosis 04/13/2019  ? Cervical vertebral fracture ([place]) 04/13/2019  ? Neck pain 04/03/2019  ? Cervical radiculopathy 04/03/2019  ? Hypertensive urgency 04/01/2019  ? Intractable abdominal pain 05/01/2018  ? Gastroenteritis 10/02/2017  ? Abdominal pain, acute 08/21/2017  ? Malnutrition of moderate degree 08/21/2017  ? SBP (spontaneous bacterial peritonitis) ([place]) 06/09/2017  ? Erroneous encounter - disregard 03/04/2017  ? Hyperkalemia 11/27/2016  ? Acute hyperkalemia 10/28/2016  ? Chronic diastolic (congestive) heart failure ([place]) 10/13/2016  ? Cirrhosis ([place]) 10/13/2016  ? HTN (hypertension) 10/13/2016  ? Abdominal pain 08/28/2016  ? Pulmonary edema 07/03/2016  ? Fluid overload 04/12/2016  ? Chest pain 04/11/2016  ? Scrotal infection 01/25/2015  ? ESRD (end stage renal disease) on dialysis ([place]) 01/25/2015  ? ?PCP:  [name], MD ?Pharmacy:   ?[facility] ([facility] [place] [facility] [place] ?  [address] ([place]) [place] [address] ?Phone: [phone] Fax: [phone] ? ?[facility] ?[address] ?[place] [address] ?Phone: [phone] Fax: [phone] ? ?[facility] ?[address] ?[place] [address] ?Phone: [phone] Fax: [phone] ? ? ? ? ?Social Determinants of Health (SDOH) Interventions ?  ? ?Readmission Risk Interventions ?Readmission Risk Prevention Plan 12/30/2019  ?Transportation Screening Complete  ?PCP or Specialist Appt within 3-5 Days Patient refused  ?Social Work Consult for [place] Planning/Counseling  Complete  ?Palliative Care Screening Not Applicable  ?Medication Review Press photographer) Complete  ?Some recent data might be hidden  ? ? ? ?

## 2021-10-17 NOTE — Progress Notes (Signed)
Hemodialysis patient known at [facility] TTS 6:00am, rides with [employer]. Patient stated no dialysis concerns.  ?

## 2021-10-17 NOTE — Progress Notes (Signed)
Hemodialysis Post Treatment Note ? ?Tx Date: 17 October 2021 ? ?Tx Time: 3 hours scheduled, actual 2.5 hours ? ?Access: Left Upper Arm Fistula ? ?UF Removed: 1251 ml  ? ?Note:  ?Patient with scheduled 3 hour scheduled hemodialysis treatment. Patient LUA AVF cannulates with ease, no excessive post treatment bleeding. Pt. request to terminate treatment early, completes only 2.5 hours. Targeted UF unmet due to shorten treatment. Patient consumed less than 50% of lunch delivered to suite. Patient endorses generalized pain, states he will ask primary nurse for medication. VSS. Patient returned to assigned room.  ?

## 2021-10-17 NOTE — Progress Notes (Signed)
? ?[facility] ?PROGRESS NOTE ? ? ?Mario [name] CBS:[id number] DOB: 1971/01/17 DOA: 10/15/2021  1 ?DOS: the patient was seen and examined on 10/17/2021 ? ?PCP: [name], MD ? ?Brief History and Hospital Course:  ?51 year old male with history of ESRD on HD TThSa, known chronic liver cirrhosis, portal hypertension, hypertension, neuropathy, who presented to the ED for chief concern of weakness and shortness of breath.  Patient was complaining of abdominal pain with nausea and vomiting.  There was concern for SBP.  Diagnostic paracentesis was done.  There was also some concern for fluid overload.  He apparently missed dialysis session yesterday because he was feeling poorly.  Patient was hospitalized for further management. ? ?Consultants: Nephrology ? ?Procedures:  Diagnostic paracentesis.  Hemodialysis ? ? ? ?Subjective: ?Spoke to patient using the interpreter services on the phone.  He mentioned that he is feeling better.  Does not have abdominal pain as yesterday.  Tolerated his diet.  Did not have any nausea vomiting.   ? ? ?Assessment/Plan: ? ? ?Generalized weakness ?Etiology is unclear.  There was concern for some kind of infection.  Patient was started on ceftriaxone for empiric treatment of SBP.  Fluid overload is likely contributing as well.  Seems to be doing better today.  We will start mobilizing. ? ?Ascites ?Diagnostic paracentesis was done.  Cell counts not high enough to make a diagnosis of SBP.  No organisms or WBC noted on Gram stain.  Patient was empirically started on ceftriaxone.  We will continue for now.  Procalcitonin is noted to be elevated at 1.16.  However significance of this is unclear in the setting of end-stage renal disease.  Cultures negative so far. ?Continue ceftriaxone for now.  If no growth by tomorrow we will discontinue. ? ?Nausea and vomiting ?Seems to be improving. ? ?SBP (spontaneous bacterial peritonitis) ([employer]) ?See under ascites. ? ?Cirrhosis  ([place]) ?See above under ascites.  Patient also has portal hypertension. ? ?Abdominal pain ?Patient also had nausea and vomiting.  CT of the abdomen pelvis did not show any evidence for bowel obstruction.  Does have large amount of ascites which could be contributing.  Paracentesis has been ordered for therapeutic drainage. ? ?ESRD (end stage renal disease) on dialysis [facility]) ?Dialyzed on Tuesday Thursday Saturday schedule.  Missed dialysis on 3/14.  Dialyzed yesterday.  Looks like he is going to be dialyzed again today.  Nephrology is following. ? ?HTN (hypertension) ?Blood pressure is elevated but stable for the most part.  We will resume amlodipine.   ? ?Chronic diastolic (congestive) heart failure (HCC) ?Volume being managed by hemodialysis. ? ?Splenic infarction ?History of splenic infarct, with documented history of not taking his Eliquis. Patient currently not taking Eliquis ? ?Anemia in ESRD (end-stage renal disease) ([place]) ?Hemoglobin low but stable.  No evidence for overt blood loss. ? ?Tobacco abuse ?Counseled. ? ? ? ?DVT Prophylaxis: Subcutaneous heparin ?Code Status: Full code ?Family Communication: Discussed with patient.  No family at bedside ?Disposition Plan: Start mobilizing.  Hopefully home tomorrow if stable. ? ?Status is: Inpatient ?Remains inpatient appropriate because: Abdominal pain, nausea and vomiting ? ? ? ? ?Medications: Scheduled: ? Chlorhexidine Gluconate Cloth  6 each Topical Q0600  ? heparin  5,000 Units Subcutaneous Q8H  ? ?Continuous: ? sodium chloride 10 mL/hr at 10/17/21 0348  ? cefTRIAXone (ROCEPHIN)  IV Stopped (10/17/21 0032)  ? ?WGY:KZLDJT chloride, acetaminophen **OR** acetaminophen, hydrALAZINE, nicotine, ondansetron **OR** ondansetron (ZOFRAN) IV, oxyCODONE-acetaminophen ? ?Antibiotics: ?Anti-infectives (From admission, onward)  ? ?  Start     Dose/Rate Route Frequency Ordered Stop  ? 10/16/21 2000  cefTRIAXone (ROCEPHIN) 2 g in sodium chloride 0.9 % 100 mL IVPB       ? 2 g ?200  mL/hr over 30 Minutes Intravenous Every 24 hours 10/15/21 1946 10/22/21 1959  ? 10/15/21 2000  cefTRIAXone (ROCEPHIN) 2 g in sodium chloride 0.9 % 100 mL IVPB  Status:  Discontinued       ? 2 g ?200 mL/hr over 30 Minutes Intravenous Every 24 hours 10/15/21 1946 10/15/21 1946  ? 10/15/21 1945  cefTRIAXone (ROCEPHIN) 2 g in sodium chloride 0.9 % 100 mL IVPB       ? 2 g ?200 mL/hr over 30 Minutes Intravenous  Once 10/15/21 1943 10/15/21 2108  ? 10/15/21 1930  cefTRIAXone (ROCEPHIN) 1 g in sodium chloride 0.9 % 100 mL IVPB  Status:  Discontinued       ? 1 g ?200 mL/hr over 30 Minutes Intravenous  Once 10/15/21 1922 10/15/21 1943  ? ?  ? ? ?Objective: ? ?Vital Signs ? ?Vitals:  ? 10/17/21 1030 10/17/21 1045 10/17/21 1100 10/17/21 1115  ?BP: (!) 185/75 (!) 181/80 (!) 132/111 (!) 164/75  ?Pulse: 66 62 64 64  ?Resp: '19 13 17 18  '$ ?Temp:      ?TempSrc:      ?SpO2: 100% 100% 100% 100%  ?Weight:      ?Height:      ? ? ?Intake/Output Summary (Last 24 hours) at 10/17/2021 1127 ?Last data filed at 10/17/2021 0348 ?Gross per 24 hour  ?Intake 132.65 ml  ?Output 1951 ml  ?Net -1818.35 ml  ? ? ?Filed Weights  ? 10/15/21 1418 10/17/21 0933  ?Weight: 65 kg 65.1 kg  ? ? ?General appearance: Awake alert.  In no distress ?Resp: Clear to auscultation bilaterally.  Normal effort ?Cardio: S1-S2 is normal regular.  No S3-S4.  No rubs murmurs or bruit ?GI: Abdomen is soft.  Nontender nondistended.  Bowel sounds are present normal.  No masses organomegaly ?Extremities: No edema.  Full range of motion of lower extremities. ?Neurologic:   No focal neurological deficits.  ? ? ? ? ?Lab Results: ? ?Data Reviewed: I have personally reviewed labs and imaging study reports ? ?CBC: ?Recent Labs  ?Lab 10/15/21 ?1433 10/16/21 ?0610 10/17/21 ?0102  ?WBC 3.8*  3.8* 4.7 3.6*  ?NEUTROABS 2.8 3.4  --   ?HGB 12.3*  12.3* 11.7* 11.7*  ?HCT 39.1  39.0 36.3* 35.8*  ?MCV 104.5*  104.3* 102.3* 100.8*  ?PLT 80*  81* 77* 66*  ? ? ? ?Basic Metabolic Panel: ?Recent  Labs  ?Lab 10/15/21 ?1433 10/16/21 ?0610 10/17/21 ?7253  ?NA 135 135 134*  ?K 5.0 5.3* 4.6  ?CL 96* 95* 95*  ?CO2 '24 22 24  '$ ?GLUCOSE 131* 86 84  ?BUN 80* 84* 51*  ?CREATININE 8.96* 9.92* 6.96*  ?CALCIUM 8.0* 8.3* 8.2*  ?MG  --  3.1*  --   ?PHOS  --  9.3*  --   ? ? ? ?GFR: ?Estimated Creatinine Clearance (by C-G formula based on SCr of 6.96 mg/dL (H)) ?Male: 9 mL/min (A) ?Male: 11.3 mL/min (A) ? ?Liver Function Tests: ?Recent Labs  ?Lab 10/15/21 ?1433 10/17/21 ?6644  ?AST 30 26  ?ALT 18 16  ?ALKPHOS 145* 126  ?BILITOT 1.0 1.0  ?PROT 7.0 6.7  ?ALBUMIN 3.5 3.3*  ? ? ? ?Recent Labs  ?Lab 10/15/21 ?1433  ?LIPASE 39  ? ? ?Recent Labs  ?Lab 10/15/21 ?1959  ?AMMONIA  30  ? ? ? ?Coagulation Profile: ?Recent Labs  ?Lab 10/15/21 ?1433 10/16/21 ?0610  ?INR 1.4* 1.4*  ? ? ? ? ? ?Recent Results (from the past 240 hour(s))  ?Resp Panel by RT-PCR (Flu A&B, Covid) Nasopharyngeal Swab     Status: None  ? Collection Time: 10/15/21  2:33 PM  ? Specimen: Nasopharyngeal Swab; Nasopharyngeal(NP) swabs in vial transport medium  ?Result Value Ref Range Status  ? SARS Coronavirus 2 by RT PCR NEGATIVE NEGATIVE Final  ?  Comment: (NOTE) ?SARS-CoV-2 target nucleic acids are NOT DETECTED. ? ?The SARS-CoV-2 RNA is generally detectable in upper respiratory ?specimens during the acute phase of infection. The lowest ?concentration of SARS-CoV-2 viral copies this assay can detect is ?138 copies/mL. A negative result does not preclude SARS-Cov-2 ?infection and should not be used as the sole basis for treatment or ?other patient management decisions. A negative result may occur with  ?improper specimen collection/handling, submission of specimen other ?than nasopharyngeal swab, presence of viral mutation(s) within the ?areas targeted by this assay, and inadequate number of viral ?copies(<138 copies/mL). A negative result must be combined with ?clinical observations, patient history, and epidemiological ?information. The expected result is  Negative. ? ?Fact Sheet for Patients:  ?EntrepreneurPulse.com.au ? ?Fact Sheet for Healthcare Providers:  ?IncredibleEmployment.be ? ?This test is no t yet approved or cleared by the [place]

## 2021-10-17 NOTE — Assessment & Plan Note (Addendum)
Resolved.  CT abdomen did not show obstruction. ?

## 2021-10-17 NOTE — Assessment & Plan Note (Signed)
See under ascites. ?

## 2021-10-17 NOTE — Progress Notes (Signed)
Cross Cover ?Hydrocodone renewed for 2 days for moderate to severe abdominal pain ? ?

## 2021-10-17 NOTE — Progress Notes (Addendum)
Initial Nutrition Assessment ? ?DOCUMENTATION CODES:  ? ?Not applicable ? ?INTERVENTION:  ? ?Nepro Shake po TID, each supplement provides 425 kcal and 19 grams protein ? ?Rena-vite po daily  ? ?Pt at high refeed risk; recommend monitor potassium, magnesium and phosphorus labs daily until stable ? ?NUTRITION DIAGNOSIS:  ? ?Increased nutrient needs related to chronic illness (ESRD on HD, cirrhosis) as evidenced by estimated needs. ? ?GOAL:  ? ?Patient will meet greater than or equal to 90% of their needs ? ?MONITOR:  ? ?PO intake, Supplement acceptance, Labs, Weight trends, Skin, I & O's ? ?REASON FOR ASSESSMENT:  ? ?Malnutrition Screening Tool ?  ? ?ASSESSMENT:  ? ?51 y/o male with h/o ESRD on HD, cirrhosis w/ ascites, CHF, splenic infarction, anemia and etoh abuse who is admitted with nausea, vomiting, abdominal pain and sepsis. ? ?Pt s/p paracentesis today with 2.2L output.  ? ?Unable to see pt today after multiple attempts as pt unavailable at time of RD visit. Pt is well known to this RD from numerous previous admissions. Pt with chronic poor appetite and oral intake. Pt also with h/o vitamin D and C deficiencies. There are no documented meal intakes in chart. Per chart, pt appears weight stable pta but is noted to have ascites. RD will add supplements and vitamins to help pt meet his estimated needs and to replaced losses from HD. RD will follow up to obtain nutrition related history and exam. Pt previously diagnosed with moderate malnutrition.  ? ?Medications reviewed and include: heparin, ceftriaxone  ? ?Labs reviewed: Na 134(L), K 4.6 wnl, BUN 51(H), creat 6.96(H), P 9.3(H), Mg 3.1(H) ?Wbc- 3.6(L) ? ?NUTRITION - FOCUSED PHYSICAL EXAM: ?Unable to perform at this time  ? ?Diet Order:   ?Diet Order   ? ?       ?  Diet renal/carb modified with fluid restriction Diet-HS Snack? Nothing; Fluid restriction: 1200 mL Fluid; Room service appropriate? No; Fluid consistency: Thin  Diet effective now       ?  ? ?  ?  ? ?   ? ?EDUCATION NEEDS:  ? ?Not appropriate for education at this time ? ?Skin:  Skin Assessment: Reviewed RN Assessment ? ?Last BM:  3/15 ? ?Height:  ? ?Ht Readings from Last 1 Encounters:  ?10/15/21 '5\' 6"'$  (1.676 m)  ? ? ?Weight:  ? ?Wt Readings from Last 1 Encounters:  ?10/17/21 63.9 kg  ? ? ?Ideal Body Weight:  64.5 kg ? ?BMI:  Body mass index is 22.74 kg/m?. ? ?Estimated Nutritional Needs:  ? ?Kcal:  1900-2200kcal/day ? ?Protein:  95-110g/day ? ?Fluid:  UOP +1L ? ?Koleen Distance MS, RD, LDN ?Please refer to [facility] for RD and/or RD on-call/weekend/after hours pager ? ?

## 2021-10-17 NOTE — Progress Notes (Signed)
Cross Cover ?Melatonin ordered for patient for sleep aid request ?

## 2021-10-18 MED ORDER — AMLODIPINE BESYLATE 5 MG PO TABS: 5.0000 mg | ORAL_TABLET | Freq: Every day | ORAL | 2 refills | Status: DC

## 2021-10-18 MED ORDER — OXYCODONE-ACETAMINOPHEN 5-325 MG PO TABS: 1.0000 | ORAL_TABLET | Freq: Three times a day (TID) | ORAL | 0 refills | Status: DC | PRN

## 2021-10-18 MED ORDER — CIPROFLOXACIN HCL 250 MG PO TABS: 250.0000 mg | ORAL_TABLET | Freq: Every day | ORAL | 0 refills | Status: AC

## 2021-10-18 NOTE — Discharge Summary (Signed)
?[facility] ? ?Physician Discharge Summary  ? ?Patient ID: ?Mentone ?MRN: [id number] ?DOB/AGE: 52-Apr-1972 52 y.o. ? ?Admit date: 10/15/2021 ?Discharge date: 10/18/2021   ? ?PCP: [name], MD ? ?DISCHARGE DIAGNOSES:  ?Principal Problem: ?  Sepsis ([place]) ?Active Problems: ?  Generalized weakness ?  Abdominal pain ?  Cirrhosis ([place]) ?  SBP (spontaneous bacterial peritonitis) ([place]) ?  Nausea and vomiting ?  Ascites ?  ESRD (end stage renal disease) on dialysis [facility]) ?  HTN (hypertension) ?  Chronic diastolic (congestive) heart failure (HCC) ?  Splenic infarction ?  Anemia in ESRD (end-stage renal disease) ([place]) ?  Tobacco abuse ? ? ? ?RECOMMENDATIONS FOR OUTPATIENT FOLLOW UP: ?Patient instructed to keep up with his dialysis schedule ? ? ?Home Health: None ?Equipment/Devices: None ? ?CODE STATUS: Full code ? ?DISCHARGE CONDITION: fair ? ?Diet recommendation: As before ? ?INITIAL HISTORY: ?51 year old male with history of ESRD on HD TThSa, known chronic liver cirrhosis, portal hypertension, hypertension, neuropathy, who presented to the ED for chief concern of weakness and shortness of breath.  Patient was complaining of abdominal pain with nausea and vomiting.  There was concern for SBP.  Diagnostic paracentesis was done.  There was also some concern for fluid overload.  He apparently missed dialysis session yesterday because he was feeling poorly.  Patient was hospitalized for further management. ? ?Consultants: Nephrology ?  ?Procedures:  Diagnostic paracentesis.  Hemodialysis.  Therapeutic paracentesis ? ? ?HOSPITAL COURSE:  ? ? ?Generalized weakness ?Etiology is unclear.  There was concern for some kind of infection.  Patient was started on ceftriaxone for empiric treatment of SBP.  Fluid overload is likely contributing as well.  Patient started improving after he was dialyzed and underwent therapeutic paracentesis.  He was able to ambulate without difficulty.  Feels like he is back to  his baseline. ? ?Ascites ?Diagnostic paracentesis was done.  Cell counts not high enough to make a diagnosis of SBP.  No organisms or WBC noted on Gram stain.  Patient was empirically started on ceftriaxone.  We will continue for now.  Procalcitonin is noted to be elevated at 1.16.  However significance of this is unclear in the setting of end-stage renal disease.  Cultures negative.  ? ?Nausea and vomiting ?Resolved.  CT abdomen did not show obstruction. ? ?SBP (spontaneous bacterial peritonitis) ([place]) ?See under ascites. ? ?Cirrhosis ([place]) ?See above under ascites.  Patient also has portal hypertension. ? ?Abdominal pain ?Patient also had nausea and vomiting.  CT of the abdomen pelvis did not show any evidence for bowel obstruction.  Ascites likely contributing.  Patient improved after therapeutic paracentesis. ? ?ESRD (end stage renal disease) on dialysis [facility] ?Dialyzed on Tuesday Thursday Saturday schedule.  Missed dialysis on 3/14.  Seen by nephrology and underwent multiple dialysis sessions. ? ?HTN (hypertension) ?Continue home medications. ? ?Chronic diastolic (congestive) heart failure (HCC) ?Volume being managed by hemodialysis. ? ?Splenic infarction ?History of splenic infarct, with documented history of not taking his Eliquis. Patient currently not taking Eliquis ? ?Anemia in ESRD (end-stage renal disease) ([place]) ?Hemoglobin low but stable.  No evidence for overt blood loss. ? ?Tobacco abuse ?Counseled. ? ? ? ?Patient is stable.  Okay for discharge home today. ? ?PERTINENT LABS: ? ?The results of significant diagnostics from this hospitalization (including imaging, microbiology, ancillary and laboratory) are listed below for reference.   ? ?Microbiology: ?Recent Results (from the past 240 hour(s))  ?Resp Panel by RT-PCR (Flu A&B, Covid) Nasopharyngeal Swab  Status: None  ? Collection Time: 10/15/21  2:33 PM  ? Specimen: Nasopharyngeal Swab; Nasopharyngeal(NP) swabs in vial transport medium  ?Result  Value Ref Range Status  ? SARS Coronavirus 2 by RT PCR NEGATIVE NEGATIVE Final  ?  Comment: (NOTE) ?SARS-CoV-2 target nucleic acids are NOT DETECTED. ? ?The SARS-CoV-2 RNA is generally detectable in upper respiratory ?specimens during the acute phase of infection. The lowest ?concentration of SARS-CoV-2 viral copies this assay can detect is ?138 copies/mL. A negative result does not preclude SARS-Cov-2 ?infection and should not be used as the sole basis for treatment or ?other patient management decisions. A negative result may occur with  ?improper specimen collection/handling, submission of specimen other ?than nasopharyngeal swab, presence of viral mutation(s) within the ?areas targeted by this assay, and inadequate number of viral ?copies(<138 copies/mL). A negative result must be combined with ?clinical observations, patient history, and epidemiological ?information. The expected result is Negative. ? ?Fact Sheet for Patients:  ?EntrepreneurPulse.com.au ? ?Fact Sheet for Healthcare Providers:  ?IncredibleEmployment.be ? ?This test is no t yet approved or cleared by the [place] FDA and  ?has been authorized for detection and/or diagnosis of SARS-CoV-2 by ?FDA under an Emergency Use Authorization (EUA). This EUA will remain  ?in effect (meaning this test can be used) for the duration of the ?COVID-19 declaration under Section 564(b)(1) of the Act, 21 ?U.S.C.section 360bbb-3(b)(1), unless the authorization is terminated  ?or revoked sooner.  ? ? ?  ? Influenza A by PCR NEGATIVE NEGATIVE Final  ? Influenza B by PCR NEGATIVE NEGATIVE Final  ?  Comment: (NOTE) ?The Xpert Xpress SARS-CoV-2/FLU/RSV plus assay is intended as an aid ?in the diagnosis of influenza from Nasopharyngeal swab specimens and ?should not be used as a sole basis for treatment. Nasal washings and ?aspirates are unacceptable for Xpert Xpress SARS-CoV-2/FLU/RSV ?testing. ? ?Fact Sheet for  Patients: ?EntrepreneurPulse.com.au ? ?Fact Sheet for Healthcare Providers: ?IncredibleEmployment.be ? ?This test is not yet approved or cleared by the [place] FDA and ?has been authorized for detection and/or diagnosis of SARS-CoV-2 by ?FDA under an Emergency Use Authorization (EUA). This EUA will remain ?in effect (meaning this test can be used) for the duration of the ?COVID-19 declaration under Section 564(b)(1) of the Act, 21 U.S.C. ?section 360bbb-3(b)(1), unless the authorization is terminated or ?revoked. ? ?Performed at [facility], [place], ?[place] [address] ?  ?Body fluid culture w Gram Stain     Status: None  ? Collection Time: 10/15/21  7:22 PM  ? Specimen: Peritoneal Washings; Body Fluid  ?Result Value Ref Range Status  ? Specimen Description   Final  ?  PERITONEAL ?Performed at [facility], [address], [place], [place] [address] ?  ? Special Requests   Final  ?  PERITONEAL ?Performed at [facility], [address] [place], [place] [address] ?  ? Gram Stain   Final  ?  NO SQUAMOUS EPITHELIAL CELLS SEEN ?NO WBC SEEN ?NO ORGANISMS SEEN ?  ? Culture   Final  ?  NO GROWTH 3 DAYS ?Performed at [facility], [place] [address] [place], [place] [address] ?  ? Report Status 10/19/2021 FINAL  Final  ?Blood culture (routine x 2)     Status: None (Preliminary result)  ? Collection Time: 10/15/21  7:33 PM  ? Specimen: BLOOD  ?Result Value Ref Range Status  ? Specimen Description BLOOD RIGHT ASSIST CONTROL  Final  ? Special Requests   Final  ?  BOTTLES DRAWN AEROBIC AND ANAEROBIC Blood Culture adequate volume  ? Culture   Final  ?  NO GROWTH 4 DAYS ?Performed at [facility], [address] [place], [place] [address] ?  ? Report Status PENDING  Incomplete  ?Blood culture (routine x 2)     Status: None (Preliminary result)  ? Collection Time: 10/15/21  7:33 PM  ? Specimen: BLOOD  ?Result Value Ref Range  Status  ? Specimen Description BLOOD RIGHT HAND  Final  ? Special Requests   Final  ?  BOTTLES DRAWN AEROBIC AND ANAEROBIC Blood Culture adequate volume  ? Culture   Final  ?  NO GROWTH 4 DAYS ?Performed at [facility],

## 2021-10-18 NOTE — Progress Notes (Signed)
?[facility]  ?ROUNDING NOTE  ? ?Subjective:  ? ?Mario [name] is a 50 year old Hispanic male with past medical history including hypertension, neuropathy, chronic liver cirrhosis, portal hypertension, and end-stage renal disease on hemodialysis.  Patient presents to the emergency department with complaints of muscle aches and weakness.  Patient has been admitted for Generalized abdominal pain [R10.84] ?SBP (spontaneous bacterial peritonitis) ([place]) [K65.2] ?ESRD (end stage renal disease) ([place]) [N18.6] ?Ascites [R18.8] ?Sepsis ([place]) [A41.9] ?Fever, unspecified fever cause [R50.9]  ? ?Patient is known to our practice and receives outpatient dialysis treatments at [facility] on a TTS schedule, overseen by [employer].   ? ?Update:  ?Patient sitting up in bed ?Fully dressed ?Denies pain and discomfort ?Afebrile overnight ?No lower extremity edema ? ?Objective:  ?Vital signs in last 24 hours:  ?Temp:  [98.2 ?F (36.8 ?C)-98.8 ?F (37.1 ?C)] 98.6 ?F (37 ?C) (03/17 0734) ?Pulse Rate:  [61-73] 64 (03/17 0734) ?Resp:  [16-20] 18 (03/17 0734) ?BP: (119-207)/(61-84) 153/75 (03/17 0734) ?SpO2:  [96 %-100 %] 100 % (03/17 0734) ?Weight:  [63.9 kg] 63.9 kg (03/16 1300) ? ?Weight change:  ?Filed Weights  ? 10/15/21 1418 10/17/21 0933 10/17/21 1300  ?Weight: 65 kg 65.1 kg 63.9 kg  ? ? ?Intake/Output: ?I/O last 3 completed shifts: ?In: 607.5 [P.O.:474; I.V.:33.5; IV Piggyback:100] ?Out: 1251 [Other:1251] ?  ?Intake/Output this shift: ? Total I/O ?In: 240 [P.O.:240] ?Out: -  ? ?Physical Exam: ?General: NAD, resting quietly  ?Head: Normocephalic, atraumatic. Moist oral mucosal membranes  ?Eyes: Anicteric  ?Neck: Supple  ?Lungs:  Clear to auscultation, normal effort  ?Heart: Regular rate and rhythm  ?Abdomen:  Soft, nontender, distended  ?Extremities: No peripheral edema.  ?Neurologic: Nonfocal, moving all four extremities  ?Skin: No lesions  ?Access: Left aVF  ? ? ?Basic Metabolic Panel: ?Recent  Labs  ?Lab 10/15/21 ?1433 10/16/21 ?0610 10/17/21 ?6389  ?NA 135 135 134*  ?K 5.0 5.3* 4.6  ?CL 96* 95* 95*  ?CO2 '24 22 24  '$ ?GLUCOSE 131* 86 84  ?BUN 80* 84* 51*  ?CREATININE 8.96* 9.92* 6.96*  ?CALCIUM 8.0* 8.3* 8.2*  ?MG  --  3.1*  --   ?PHOS  --  9.3*  --   ? ? ? ?Liver Function Tests: ?Recent Labs  ?Lab 10/15/21 ?1433 10/17/21 ?3734  ?AST 30 26  ?ALT 18 16  ?ALKPHOS 145* 126  ?BILITOT 1.0 1.0  ?PROT 7.0 6.7  ?ALBUMIN 3.5 3.3*  ? ? ?Recent Labs  ?Lab 10/15/21 ?1433  ?LIPASE 39  ? ? ?Recent Labs  ?Lab 10/15/21 ?1959  ?AMMONIA 30  ? ? ? ?CBC: ?Recent Labs  ?Lab 10/15/21 ?1433 10/16/21 ?0610 10/17/21 ?2876  ?WBC 3.8*  3.8* 4.7 3.6*  ?NEUTROABS 2.8 3.4  --   ?HGB 12.3*  12.3* 11.7* 11.7*  ?HCT 39.1  39.0 36.3* 35.8*  ?MCV 104.5*  104.3* 102.3* 100.8*  ?PLT 80*  81* 77* 66*  ? ? ? ?Cardiac Enzymes: ?No results for input(s): CKTOTAL, CKMB, CKMBINDEX, TROPONINI in the last 168 hours. ? ?BNP: ?Invalid input(s): POCBNP ? ?CBG: ?No results for input(s): GLUCAP in the last 168 hours. ? ?Microbiology: ?Results for orders placed or performed during the hospital encounter of 10/15/21  ?Resp Panel by RT-PCR (Flu A&B, Covid) Nasopharyngeal Swab     Status: None  ? Collection Time: 10/15/21  2:33 PM  ? Specimen: Nasopharyngeal Swab; Nasopharyngeal(NP) swabs in vial transport medium  ?Result Value Ref Range Status  ? SARS Coronavirus 2 by RT PCR  NEGATIVE NEGATIVE Final  ?  Comment: (NOTE) ?SARS-CoV-2 target nucleic acids are NOT DETECTED. ? ?The SARS-CoV-2 RNA is generally detectable in upper respiratory ?specimens during the acute phase of infection. The lowest ?concentration of SARS-CoV-2 viral copies this assay can detect is ?138 copies/mL. A negative result does not preclude SARS-Cov-2 ?infection and should not be used as the sole basis for treatment or ?other patient management decisions. A negative result may occur with  ?improper specimen collection/handling, submission of specimen other ?than nasopharyngeal swab,  presence of viral mutation(s) within the ?areas targeted by this assay, and inadequate number of viral ?copies(<138 copies/mL). A negative result must be combined with ?clinical observations, patient history, and epidemiological ?information. The expected result is Negative. ? ?Fact Sheet for Patients:  ?EntrepreneurPulse.com.au ? ?Fact Sheet for Healthcare Providers:  ?IncredibleEmployment.be ? ?This test is no t yet approved or cleared by the [place] FDA and  ?has been authorized for detection and/or diagnosis of SARS-CoV-2 by ?FDA under an Emergency Use Authorization (EUA). This EUA will remain  ?in effect (meaning this test can be used) for the duration of the ?COVID-19 declaration under Section 564(b)(1) of the Act, 21 ?U.S.C.section 360bbb-3(b)(1), unless the authorization is terminated  ?or revoked sooner.  ? ? ?  ? Influenza A by PCR NEGATIVE NEGATIVE Final  ? Influenza B by PCR NEGATIVE NEGATIVE Final  ?  Comment: (NOTE) ?The Xpert Xpress SARS-CoV-2/FLU/RSV plus assay is intended as an aid ?in the diagnosis of influenza from Nasopharyngeal swab specimens and ?should not be used as a sole basis for treatment. Nasal washings and ?aspirates are unacceptable for Xpert Xpress SARS-CoV-2/FLU/RSV ?testing. ? ?Fact Sheet for Patients: ?EntrepreneurPulse.com.au ? ?Fact Sheet for Healthcare Providers: ?IncredibleEmployment.be ? ?This test is not yet approved or cleared by the [place] FDA and ?has been authorized for detection and/or diagnosis of SARS-CoV-2 by ?FDA under an Emergency Use Authorization (EUA). This EUA will remain ?in effect (meaning this test can be used) for the duration of the ?COVID-19 declaration under Section 564(b)(1) of the Act, 21 U.S.C. ?section 360bbb-3(b)(1), unless the authorization is terminated or ?revoked. ? ?Performed at [facility], [place], ?[place] [address] ?  ?Body fluid  culture w Gram Stain     Status: None (Preliminary result)  ? Collection Time: 10/15/21  7:22 PM  ? Specimen: Peritoneal Washings; Body Fluid  ?Result Value Ref Range Status  ? Specimen Description   Final  ?  PERITONEAL ?Performed at [facility], [address], [place], [place] [address] ?  ? Special Requests   Final  ?  PERITONEAL ?Performed at [facility], [address], [place], [place] [address] ?  ? Gram Stain   Final  ?  NO SQUAMOUS EPITHELIAL CELLS SEEN ?NO WBC SEEN ?NO ORGANISMS SEEN ?  ? Culture   Final  ?  NO GROWTH 3 DAYS ?Performed at [facility], [place] [address], [place], [place] [address] ?  ? Report Status PENDING  Incomplete  ?Blood culture (routine x 2)     Status: None (Preliminary result)  ? Collection Time: 10/15/21  7:33 PM  ? Specimen: BLOOD  ?Result Value Ref Range Status  ? Specimen Description BLOOD RIGHT ASSIST CONTROL  Final  ? Special Requests   Final  ?  BOTTLES DRAWN AEROBIC AND ANAEROBIC Blood Culture adequate volume  ? Culture   Final  ?  NO GROWTH 3 DAYS ?Performed at [facility], [place],  [place], [place] [address] ?  ? Report Status PENDING  Incomplete  ?Blood culture (routine x 2)     Status: None (Preliminary result)  ? Collection Time: 10/15/21  7:33 PM  ? Specimen: BLOOD  ?Result Value Ref Range Status  ? Specimen Description BLOOD RIGHT HAND  Final  ? Special Requests   Final  ?  BOTTLES DRAWN AEROBIC AND ANAEROBIC Blood Culture adequate volume  ? Culture   Final  ?  NO GROWTH 3 DAYS ?Performed at [facility], [address], [place],  [address] ?  ? Report Status PENDING  Incomplete  ? ? ?Coagulation Studies: ?Recent Labs  ?  10/15/21 ?1433 10/16/21 ?0610  ?LABPROT 16.8* 16.9*  ?INR 1.4* 1.4*  ? ? ? ?Urinalysis: ?No results for input(s): COLORURINE, LABSPEC, [place], GLUCOSEU, HGBUR, BILIRUBINUR, KETONESUR, PROTEINUR, UROBILINOGEN, NITRITE, LEUKOCYTESUR in the last 72 hours. ? ?Invalid input(s): APPERANCEUR   ? ? ?Imaging: ?US Paracentesis ? ?Result Date: 10/17/2021 ?INDICATION: 50 year old male with end-stage renal disease on hemodialysis and chronic liver cirrhosis with recurrent ascites and request received for therape

## 2021-10-18 NOTE — Progress Notes (Signed)
PT Cancellation Note ? ?Patient Details ?Name: Mario [name] ?MRN: [id number] ?DOB: 04/08/1971 ? ? ?Cancelled Treatment:    Reason Eval/Treat Not Completed: PT screened, no needs identified, will sign off PT orders received, chart reviewed. Per chart, pt ambulated 340 ft independently without AD with mobility tech yesterday. Pt with no acute PT needs at this time & PT to sign off & complete current orders. Team notified. ? ?Lavone Nian, PT, DPT ?10/18/21, 8:29 AM ? ? ?Waunita Schooner ?10/18/2021, 8:28 AM ?

## 2021-10-19 LAB — BODY FLUID CULTURE W GRAM STAIN
Culture: NO GROWTH
Gram Stain: NONE SEEN

## 2021-10-20 LAB — CULTURE, BLOOD (ROUTINE X 2)
Culture: NO GROWTH
Culture: NO GROWTH
Special Requests: ADEQUATE
Special Requests: ADEQUATE

## 2022-02-03 ENCOUNTER — Emergency Department: Payer: Self-pay

## 2022-02-03 ENCOUNTER — Encounter: Payer: Self-pay | Admitting: Emergency Medicine

## 2022-02-03 ENCOUNTER — Observation Stay: Payer: Self-pay

## 2022-02-03 ENCOUNTER — Observation Stay
Admission: EM | Admit: 2022-02-03 | Discharge: 2022-02-04 | Discharge status: Against Medical Advice | Payer: Self-pay | Attending: Internal Medicine | Admitting: Internal Medicine

## 2022-02-03 ENCOUNTER — Other Ambulatory Visit: Payer: Self-pay

## 2022-02-03 DIAGNOSIS — F17210 Nicotine dependence, cigarettes, uncomplicated: Secondary | ICD-10-CM | POA: Insufficient documentation

## 2022-02-03 DIAGNOSIS — I5042 Chronic combined systolic (congestive) and diastolic (congestive) heart failure: Secondary | ICD-10-CM | POA: Insufficient documentation

## 2022-02-03 DIAGNOSIS — R188 Other ascites: Secondary | ICD-10-CM

## 2022-02-03 DIAGNOSIS — J811 Chronic pulmonary edema: Secondary | ICD-10-CM | POA: Insufficient documentation

## 2022-02-03 DIAGNOSIS — I132 Hypertensive heart and chronic kidney disease with heart failure and with stage 5 chronic kidney disease, or end stage renal disease: Secondary | ICD-10-CM | POA: Insufficient documentation

## 2022-02-03 DIAGNOSIS — N186 End stage renal disease: Secondary | ICD-10-CM | POA: Insufficient documentation

## 2022-02-03 DIAGNOSIS — R0602 Shortness of breath: Secondary | ICD-10-CM

## 2022-02-03 DIAGNOSIS — I10 Essential (primary) hypertension: Secondary | ICD-10-CM

## 2022-02-03 DIAGNOSIS — Z79899 Other long term (current) drug therapy: Secondary | ICD-10-CM | POA: Insufficient documentation

## 2022-02-03 DIAGNOSIS — K7031 Alcoholic cirrhosis of liver with ascites: Principal | ICD-10-CM | POA: Insufficient documentation

## 2022-02-03 DIAGNOSIS — Z992 Dependence on renal dialysis: Secondary | ICD-10-CM | POA: Insufficient documentation

## 2022-02-03 DIAGNOSIS — Z20822 Contact with and (suspected) exposure to covid-19: Secondary | ICD-10-CM | POA: Insufficient documentation

## 2022-02-03 LAB — BODY FLUID CELL COUNT WITH DIFFERENTIAL
Eos, Fluid: 0 %
Lymphs, Fluid: 56 %
Monocyte-Macrophage-Serous Fluid: 31 %
Neutrophil Count, Fluid: 13 %
Total Nucleated Cell Count, Fluid: 173 cu mm

## 2022-02-03 LAB — CBC
HCT: 30.1 % — ABNORMAL LOW (ref 39.0–52.0)
Hemoglobin: 9.6 g/dL — ABNORMAL LOW (ref 13.0–17.0)
MCH: 32.8 pg (ref 26.0–34.0)
MCHC: 31.9 g/dL (ref 30.0–36.0)
MCV: 102.7 fL — ABNORMAL HIGH (ref 80.0–100.0)
Platelets: 113 10*3/uL — ABNORMAL LOW (ref 150–400)
RBC: 2.93 MIL/uL — ABNORMAL LOW (ref 4.22–5.81)
RDW: 14.1 % (ref 11.5–15.5)
WBC: 4.8 10*3/uL (ref 4.0–10.5)
nRBC: 0 % (ref 0.0–0.2)

## 2022-02-03 LAB — BASIC METABOLIC PANEL
Anion gap: 13 (ref 5–15)
BUN: 66 mg/dL — ABNORMAL HIGH (ref 6–20)
CO2: 24 mmol/L (ref 22–32)
Calcium: 8.5 mg/dL — ABNORMAL LOW (ref 8.9–10.3)
Chloride: 98 mmol/L (ref 98–111)
Creatinine, Ser: 7.5 mg/dL — ABNORMAL HIGH (ref 0.61–1.24)
GFR, Estimated: 8 mL/min — ABNORMAL LOW (ref >60)
Glucose, Bld: 118 mg/dL — ABNORMAL HIGH (ref 70–99)
Potassium: 4.3 mmol/L (ref 3.5–5.1)
Sodium: 135 mmol/L (ref 135–145)

## 2022-02-03 LAB — PATHOLOGIST SMEAR REVIEW

## 2022-02-03 LAB — SARS CORONAVIRUS 2 BY RT PCR: SARS Coronavirus 2 by RT PCR: NEGATIVE

## 2022-02-03 LAB — HEPATIC FUNCTION PANEL
ALT: 28 U/L (ref 0–44)
AST: 40 U/L (ref 15–41)
Albumin: 3.6 g/dL (ref 3.5–5.0)
Alkaline Phosphatase: 177 U/L — ABNORMAL HIGH (ref 38–126)
Bilirubin, Direct: 0.2 mg/dL (ref 0.0–0.2)
Indirect Bilirubin: 0.7 mg/dL (ref 0.3–0.9)
Total Bilirubin: 0.9 mg/dL (ref 0.3–1.2)
Total Protein: 7.6 g/dL (ref 6.5–8.1)

## 2022-02-03 LAB — TROPONIN I (HIGH SENSITIVITY)
Troponin I (High Sensitivity): 43 ng/L — ABNORMAL HIGH (ref <18)
Troponin I (High Sensitivity): 38 ng/L — ABNORMAL HIGH (ref <18)

## 2022-02-03 LAB — LIPASE, BLOOD: Lipase: 85 U/L — ABNORMAL HIGH (ref 11–51)

## 2022-02-03 MED ORDER — ACETAMINOPHEN 325 MG PO TABS
650.0000 mg | ORAL_TABLET | Freq: Four times a day (QID) | ORAL | Status: DC | PRN
  Administered 2022-02-03: 650 mg via ORAL
  Filled 2022-02-03: qty 2

## 2022-02-03 MED ORDER — FERRIC CITRATE 1 GM 210 MG(FE) PO TABS
420.0000 mg | ORAL_TABLET | Freq: Three times a day (TID) | ORAL | Status: DC
  Administered 2022-02-03 – 2022-02-04 (×2): 420 mg via ORAL
  Filled 2022-02-03 (×3): qty 2

## 2022-02-03 MED ORDER — ONDANSETRON HCL 4 MG PO TABS: 4.0000 mg | ORAL_TABLET | Freq: Four times a day (QID) | ORAL | Status: DC | PRN

## 2022-02-03 MED ORDER — SENNOSIDES-DOCUSATE SODIUM 8.6-50 MG PO TABS: 1.0000 | ORAL_TABLET | Freq: Every evening | ORAL | Status: DC | PRN

## 2022-02-03 MED ORDER — HEPARIN SODIUM (PORCINE) 5000 UNIT/ML IJ SOLN
5000.0000 [IU] | Freq: Three times a day (TID) | INTRAMUSCULAR | Status: DC
  Administered 2022-02-03 – 2022-02-04 (×3): 5000 [IU] via SUBCUTANEOUS
  Filled 2022-02-03 (×3): qty 1

## 2022-02-03 MED ORDER — PROCHLORPERAZINE EDISYLATE 10 MG/2ML IJ SOLN
5.0000 mg | Freq: Once | INTRAMUSCULAR | Status: AC
  Administered 2022-02-03: 5 mg via INTRAVENOUS
  Filled 2022-02-03: qty 2

## 2022-02-03 MED ORDER — ONDANSETRON HCL 4 MG/2ML IJ SOLN: 4.0000 mg | Freq: Four times a day (QID) | INTRAMUSCULAR | Status: DC | PRN

## 2022-02-03 MED ORDER — ACETAMINOPHEN 650 MG RE SUPP: 650.0000 mg | Freq: Four times a day (QID) | RECTAL | Status: DC | PRN

## 2022-02-03 MED ORDER — HYDRALAZINE HCL 20 MG/ML IJ SOLN: 10.0000 mg | Freq: Four times a day (QID) | INTRAMUSCULAR | Status: DC | PRN

## 2022-02-03 MED ORDER — DIPHENHYDRAMINE HCL 50 MG/ML IJ SOLN
25.0000 mg | Freq: Once | INTRAMUSCULAR | Status: AC
Start: 2022-02-03 — End: 2022-02-03
  Administered 2022-02-03: 25 mg via INTRAVENOUS
  Filled 2022-02-03: qty 1

## 2022-02-03 MED ORDER — PROCHLORPERAZINE EDISYLATE 10 MG/2ML IJ SOLN
10.0000 mg | Freq: Once | INTRAMUSCULAR | Status: DC
Start: 2022-02-03 — End: 2022-02-03

## 2022-02-03 MED ORDER — AMLODIPINE BESYLATE 5 MG PO TABS
5.0000 mg | ORAL_TABLET | Freq: Every day | ORAL | Status: DC
  Administered 2022-02-03: 5 mg via ORAL
  Filled 2022-02-03: qty 1

## 2022-02-03 MED ORDER — DOCUSATE SODIUM 100 MG PO CAPS
100.0000 mg | ORAL_CAPSULE | Freq: Two times a day (BID) | ORAL | Status: DC
  Administered 2022-02-03 – 2022-02-04 (×2): 100 mg via ORAL
  Filled 2022-02-03 (×2): qty 1

## 2022-02-03 NOTE — ED Notes (Signed)
[place] sent to lab

## 2022-02-03 NOTE — H&P (Signed)
History and Physical    Patient: Billal Rollo MHD:[id number] DOB: 12-Feb-1971 DOA: 02/03/2022 DOS: the patient was seen and examined on 02/03/2022 PCP: [name], MD  Patient coming from: Home  Chief Complaint:  Chief Complaint  Patient presents with   Shortness of Breath   HPI: Montavious Wierzba is a 51 y.o. adult with medical history significant of end-stage renal disease on hemodialysis, cirrhosis of liver with known history of portal hypertension and situs last paracentesis done in March 2023, hypertension, comes to the emergency room because of tens abdomen and had ran out of his blood pressure medication.  Patient's blood pressure in the emergency room was 21 4/90. He has been going for dialysis as outpatient. Review of Systems: As mentioned in the history of present illness. All other systems reviewed and are negative. Past Medical History:  Diagnosis Date   Abdominal pain 08/28/2016   Acute hyperkalemia 10/28/2016   Chest pain 04/11/2016   Chronic combined systolic and diastolic CHF (congestive heart failure) ([place])    Cirrhosis ([place])    Dialysis patient ([place])    Edentulous    ESRD (end stage renal disease) on dialysis ([place]) 01/25/2015   Fluid overload 04/12/2016   HTN (hypertension) 10/13/2016   Hyperkalemia 11/27/2016   Hypertension    Mitral regurgitation    Pulmonary edema 07/03/2016   Renal disorder    Renal insufficiency    Scrotal infection 01/25/2015   Past Surgical History:  Procedure Laterality Date   ANTERIOR CERVICAL CORPECTOMY N/A 04/05/2019   Procedure: ANTERIOR CERVICAL CORPECTOMY Cervical six and Cervical seven with strut graft fusion from Cervical five-Thoracic one   ;  Surgeon: Earnie Larsson, MD;  Location: [facility];  Service: Neurosurgery;  Laterality: N/A;   AV FISTULA PLACEMENT     POSTERIOR CERVICAL FUSION/FORAMINOTOMY N/A 04/05/2019   Procedure: Posterior cervical fusion utilizing segmental instrumentation from Cervical  five-Thoracic one ;  Surgeon: Earnie Larsson, MD;  Location: [facility];  Service: Neurosurgery;  Laterality: N/A;   Social History:  reports that she has been smoking cigarettes. She has a 6.25 pack-year smoking history. She has never used smokeless tobacco. She reports that she does not drink alcohol and does not use drugs.  Allergies  Allergen Reactions   Betadine [Povidone Iodine] Rash    Family History  Problem Relation Age of Onset   Kidney failure Father    Diabetes Mother     Prior to Admission medications   Medication Sig Start Date End Date Taking? Authorizing Provider  amLODipine (NORVASC) 5 MG tablet Take 1 tablet (5 mg total) by mouth daily. 10/18/21 02/03/22 Yes Bonnielee Haff, MD  ferric citrate (AURYXIA) 1 GM 210 MG(Fe) tablet Take 420 mg by mouth 3 (three) times daily with meals.   Yes [provider]  prednisoLONE acetate (PRED FORTE) 1 % ophthalmic suspension Place 1-2 drops into the right eye every 4 (four) hours as needed. 01/16/22  Yes [provider]    Physical Exam: Vitals:   02/03/22 1129 02/03/22 1152 02/03/22 1202 02/03/22 1308  BP: (!) 239/50 (!) 253/34 (!) 145/41 (!) 166/82  Pulse: 70 69 70 71  Resp: '18 18 18 18  '$ Temp:    97.8 F (36.6 C)  TempSrc:      SpO2: 96% 97% 95% 97%  Weight:      Height:       alert and oriented times three chronically ill respiratory clear to auscultation. Decreased breath on basis. No respiratory distress cardiovascular both heart  sounds normal in rate rhythm regular. No murmur abdomen soft nontender. Post paracentesis extremities no pedal edema. HD access then neuro- alert and oriented times  Assessment and Plan:  Leroy Pettway is a 51 y.o. adult with medical history significant of end-stage renal disease on hemodialysis, cirrhosis of liver with known history of portal hypertension and situs last paracentesis done in March 2023, hypertension, comes to the emergency room because of tens abdomen and  had ran out of his blood pressure medication.  Ascites recurrent in the setting of known history of cirrhosis of liver with portal hypertension -- patient is status post paracentesis ultrasound-guided by IR with remote of 3.2 L fluid -- patient feels a whole lot better and asking me for him to go home  uncontrolled hypertension due to running out of medication -- resume amlodipine -- PRN IV hydralazine  end-stage renal disease on hemodialysis -- resumed in-house hemodialysis. Patient goes Tuesday Thursday Saturday. -- Dr. Zollie Scale aware -- potassium normal  anemia and end-stage renal disease -- hemoglobin low but stable. No evidence of active bleed  if patient continues to show improvement will discharged him after dialysis tomorrow. Patient was eager to go home today. Told him need to monitor his blood pressure ensure he gets his meds .    Advance Care Planning:   Code Status: Full Code   Consults: nephrology  Family Communication: none  Severity of Illness: The appropriate patient status for this patient is OBSERVATION. Observation status is judged to be reasonable and necessary in order to provide the required intensity of service to ensure the patient's safety. The patient's presenting symptoms, physical exam findings, and initial radiographic and laboratory data in the context of their medical condition is felt to place them at decreased risk for further clinical deterioration. Furthermore, it is anticipated that the patient will be medically stable for discharge from the hospital within 2 midnights of admission.   Author: Fritzi Mandes, MD 02/03/2022 3:18 PM  For on call review [email or web address].

## 2022-02-03 NOTE — Progress Notes (Signed)
Admission profile udpated ?

## 2022-02-03 NOTE — ED Notes (Signed)
Informed RN bed assigned 

## 2022-02-03 NOTE — ED Triage Notes (Signed)
Pt via POV from home. Pt c/o SOB for the past 3 weeks, headache, and chest pain. Pt ran out of blood pressure medication a week ago, states he feels like his head is pounding. Pt states that he has been getting paracentesis for the past 4 year, last one was a year ago. States that he needs to get fluid drained. Abdomen is distended. Pt is also a dialysis pt [facility], has not missed any treatment recently. Pt is A&Ox4   Spanish interpreter needed.

## 2022-02-03 NOTE — Procedures (Addendum)
PROCEDURE SUMMARY:  Successful ultrasound guided paracentesis from the RLQ Yielded 3.2 L of clear yellow fluid.  Remaining ascites is seen post procedure, however no additional fluid was able to be removed since bowel loops obstructed the path for additional safe percutaneous access. No immediate complications.  The patient tolerated the procedure well.   Specimen was sent for labs.  EBL <  28m  If the patient eventually requires >/=2 paracenteses in a 30 day period, screening evaluation by the [facility] will be assessed.   MHedy Jacob PA-C 02/03/2022, 12:23 PM

## 2022-02-03 NOTE — ED Provider Notes (Signed)
[facility] Provider Note    Event Date/Time   First MD Initiated Contact with Patient 02/03/22 [phone]     (approximate)   History   Chief Complaint Shortness of Breath   HPI  Mario [name] is a 51 y.o. adult with past medical history of hypertension, cirrhosis, and ESRD on HD (TTS) who presents to the ED complaining of shortness of breath.  Patient reports that he has had increasing difficulty breathing for about the past 3 weeks associated with nonproductive cough and pain in his chest.  He describes the pain in his chest as a dull ache that is worse when he coughs.  He also reports diffuse abdominal pain and diffuse swelling of his abdomen, with nausea and vomiting each morning for the past couple of weeks.  He states that he feels well after vomiting in the morning and has been able to eat during the day.  He has required paracentesis in the past and arrives with a note from his nephrologist that he would likely benefit from paracentesis again today.  He states he has been receiving dialysis as scheduled with his last treatment coming 2 days ago.  He has felt feverish but has not taken his temperature at home.  He endorses a headache but denies any neck stiffness.     Physical Exam   Triage Vital Signs: ED Triage Vitals  Enc Vitals Group     BP 02/03/22 0715 (!) 196/118     Pulse Rate 02/03/22 0715 73     Resp 02/03/22 0715 (!) 24     Temp 02/03/22 0715 97.7 F (36.5 C)     Temp Source 02/03/22 0715 Oral     SpO2 02/03/22 0715 98 %     Weight 02/03/22 0710 143 lb 4.8 oz (65 kg)     Height 02/03/22 0710 '5\' 6"'$  (1.676 m)     Head Circumference --      Peak Flow --      Pain Score 02/03/22 0710 10     Pain Loc --      Pain Edu? --      Excl. in [place]? --     Most recent vital signs: Vitals:   02/03/22 0800 02/03/22 0908  BP: (!) 168/57 (!) 214/44  Pulse: 72 66  Resp: (!) 29 (!) 21  Temp:    SpO2: 100% 100%    Constitutional: Alert  and oriented. Eyes: Conjunctivae are normal. Head: Atraumatic. Neck: Supple with no meningismus. Nose: No congestion/rhinnorhea. Mouth/Throat: Mucous membranes are moist.  Cardiovascular: Normal rate, regular rhythm. Grossly normal heart sounds.  2+ radial pulses bilaterally.  Left upper extremity AV fistula with palpable thrill. Respiratory: Normal respiratory effort.  No retractions. Lungs CTAB. Gastrointestinal: Soft and diffusely tender to palpation with associated distention. Musculoskeletal: No lower extremity tenderness nor edema.  Neurologic:  Normal speech and language. No gross focal neurologic deficits are appreciated.    ED Results / Procedures / Treatments   Labs (all labs ordered are listed, but only abnormal results are displayed) Labs Reviewed  BASIC METABOLIC PANEL - Abnormal; Notable for the following components:      Result Value   Glucose, Bld 118 (*)    BUN 66 (*)    Creatinine, Ser 7.50 (*)    Calcium 8.5 (*)    GFR, Estimated 8 (*)    All other components within normal limits  CBC - Abnormal; Notable for the following components:   RBC 2.93 (*)  Hemoglobin 9.6 (*)    HCT 30.1 (*)    MCV 102.7 (*)    Platelets 113 (*)    All other components within normal limits  HEPATIC FUNCTION PANEL - Abnormal; Notable for the following components:   Alkaline Phosphatase 177 (*)    All other components within normal limits  LIPASE, BLOOD - Abnormal; Notable for the following components:   Lipase 85 (*)    All other components within normal limits  TROPONIN I (HIGH SENSITIVITY) - Abnormal; Notable for the following components:   Troponin I (High Sensitivity) 43 (*)    All other components within normal limits  SARS CORONAVIRUS 2 BY RT PCR  TROPONIN I (HIGH SENSITIVITY)     EKG  ED ECG REPORT I, Blake Divine, the attending physician, personally viewed and interpreted this ECG.   Date: 02/03/2022  EKG Time: 7:13  Rate: 71  Rhythm: normal sinus rhythm   Axis: LAD  Intervals:none  ST&T Change: None  RADIOLOGY CT head reviewed and interpreted by me with no hemorrhage or midline shift.  Chest x-ray reviewed and interpreted by me with no focal infiltrate noted, similar pulmonary vascular congestion to previous.  PROCEDURES:  Critical Care performed: No  Procedures   MEDICATIONS ORDERED IN ED: Medications  diphenhydrAMINE (BENADRYL) injection 25 mg (25 mg Intravenous Given 02/03/22 0803)  prochlorperazine (COMPAZINE) injection 5 mg (5 mg Intravenous Given 02/03/22 0800)     IMPRESSION / MDM / ASSESSMENT AND PLAN / ED COURSE  I reviewed the triage vital signs and the nursing notes.                              51 y.o. adult with past medical history of hypertension, cirrhosis, and ESRD on HD (TTS) who presents to the ED complaining of 3 weeks of increasing shortness of breath, chest pain, abdominal pain, nausea, vomiting, and headache.  Patient's presentation is most consistent with acute presentation with potential threat to life or bodily function.  Differential diagnosis includes, but is not limited to, ACS, PE, pneumonia, pneumothorax, pulmonary edema, ascites, SBP, electrolyte abnormality, viral syndrome, meningitis, SAH.  Patient well-appearing and in no acute distress, vital signs remarkable for elevated blood pressure and mild tachypnea however patient is maintaining oxygen saturations on room air.  EKG shows no evidence of arrhythmia or ischemia and symptoms sound atypical for ACS.  We will screen chest x-ray and troponin, also check for any electrolyte abnormality.  He is diffusely tender in his abdomen and complains of severe headache, we will further assess with CT of his head and abdomen.  If he has significant peritoneal fluid, he would likely benefit from paracentesis to rule out SBP.  We will treat symptomatically with IV Compazine and Benadryl.  Chest x-ray shows pulmonary vascular congestion similar to previous, no focal  infiltrate noted.  CT head is negative for acute process, CT of his abdomen shows significant ascites with evidence of portal venous hypertension and likely chronic portal vein thrombosis.  Labs are reassuring with no significant electrolyte abnormality, LFTs within normal limits, mildly elevated lipase noted but doubt acute pancreatitis.  Troponin mildly elevated but similar to previous and I doubt primary cardiac etiology.  With his significant ascites, he would benefit from admission for diagnostic and therapeutic paracentesis but overall suspicion for SBP is low at this time and we will hold off on antibiotics.  Case discussed with hospitalist for admission.  FINAL CLINICAL IMPRESSION(S) / ED DIAGNOSES   Final diagnoses:  Shortness of breath  Other ascites  Chronic pulmonary edema  ESRD on dialysis [facility])     Rx / DC Orders   ED Discharge Orders     None        Note:  This document was prepared using Dragon voice recognition software and may include unintentional dictation errors.   Blake Divine, MD 02/03/22 [phone]

## 2022-02-04 MED ORDER — AMLODIPINE BESYLATE 10 MG PO TABS
10.0000 mg | ORAL_TABLET | Freq: Every day | ORAL | Status: DC
  Administered 2022-02-04: 10 mg via ORAL
  Filled 2022-02-04: qty 1

## 2022-02-04 NOTE — Progress Notes (Signed)
Patient refusing to stay in hospital for dialysis treatment. RN explained to patient that he would be discharging after HD today but patient refusing to stay or go to HD today. Patient educated on AMA and that it is against medical recommendation to leave. Patient refusing and leaving. AMA paperwork signed and IV removed. MD made aware.

## 2022-02-04 NOTE — Discharge Summary (Signed)
  Physician Discharge Summary   Patient: Mario [name] MRN: [id number] DOB: 1971-02-06  Admit date:     02/03/2022  Discharge date: 02/04/22  Discharge Physician: Fritzi Mandes   PCP: [name], MD   patient left AMA. No prescription was given.  Patient was admitted with significant abdominal distention with his known history of cirrhosis and portal hypertension. He also had elevated blood pressure due to running out of BP meds. Patient was admitted underwent ultrasound-guided paracentesis with removal of 3.2 L fluid. He felt better and insisted on going home. Discussed with Dr. Zollie Scale and patient is supposed to get his dialysis routine today. Patient decided not to stay to get dialysis and left the hospital AMA. Signed: Fritzi Mandes, MD [facility] 02/04/2022

## 2022-02-07 LAB — BODY FLUID CULTURE W GRAM STAIN
Culture: NO GROWTH
Gram Stain: NONE SEEN

## 2022-10-23 ENCOUNTER — Emergency Department
Admission: EM | Admit: 2022-10-23 | Discharge: 2022-10-23 | Disposition: A | Discharge status: Home / Self Care | Payer: Self-pay | Attending: Emergency Medicine | Admitting: Emergency Medicine

## 2022-10-23 ENCOUNTER — Other Ambulatory Visit: Payer: Self-pay

## 2022-10-23 DIAGNOSIS — S81852A Open bite, left lower leg, initial encounter: Secondary | ICD-10-CM | POA: Insufficient documentation

## 2022-10-23 DIAGNOSIS — W540XXA Bitten by dog, initial encounter: Secondary | ICD-10-CM | POA: Insufficient documentation

## 2022-10-23 DIAGNOSIS — N186 End stage renal disease: Secondary | ICD-10-CM | POA: Insufficient documentation

## 2022-10-23 DIAGNOSIS — Z23 Encounter for immunization: Secondary | ICD-10-CM | POA: Insufficient documentation

## 2022-10-23 DIAGNOSIS — Z992 Dependence on renal dialysis: Secondary | ICD-10-CM | POA: Insufficient documentation

## 2022-10-23 MED ORDER — AMOXICILLIN-POT CLAVULANATE 875-125 MG PO TABS: 1.0000 | ORAL_TABLET | Freq: Two times a day (BID) | ORAL | 0 refills | Status: DC

## 2022-10-23 MED ORDER — TETANUS-DIPHTH-ACELL PERTUSSIS 5-2.5-18.5 LF-MCG/0.5 IM SUSY
0.5000 mL | PREFILLED_SYRINGE | Freq: Once | INTRAMUSCULAR | Status: AC
  Administered 2022-10-23: 0.5 mL via INTRAMUSCULAR
  Filled 2022-10-23: qty 0.5

## 2022-10-23 MED ORDER — AMOXICILLIN-POT CLAVULANATE 875-125 MG PO TABS
1.0000 | ORAL_TABLET | Freq: Once | ORAL | Status: AC
  Administered 2022-10-23: 1 via ORAL
  Filled 2022-10-23: qty 1

## 2022-10-23 NOTE — ED Provider Notes (Signed)
[facility] Provider Note    Event Date/Time   First MD Initiated Contact with Patient 10/23/22 1526     (approximate)   History   Animal Bite   HPI  Mario [name] is a 52 y.o. adult with history of end-stage renal disease along with dialysis and multiple other medical problems presents emergency department after dog bite to the left leg.  Patient states he was on his bicycle and was near unknown house.  States he knows the dogs at the house.  Unsure of his status for immunizations.  Patient is unsure of his last Tdap.      Physical Exam   Triage Vital Signs: ED Triage Vitals  Enc Vitals Group     BP 10/23/22 1449 137/71     Pulse Rate 10/23/22 1449 70     Resp 10/23/22 1449 18     Temp 10/23/22 1449 98.8 F (37.1 C)     Temp Source 10/23/22 1449 Oral     SpO2 10/23/22 1449 97 %     Weight 10/23/22 1448 143 Mario 4.8 oz (65 kg)     Height 10/23/22 1448 5\' 6"  (1.676 m)     Head Circumference --      Peak Flow --      Pain Score 10/23/22 1448 10     Pain Loc --      Pain Edu? --      Excl. in [place]? --     Most recent vital signs: Vitals:   10/23/22 1449  BP: 137/71  Pulse: 70  Resp: 18  Temp: 98.8 F (37.1 C)  SpO2: 97%     General: Awake, no distress.   CV:  Good peripheral perfusion. regular rate and  rhythm Resp:  Normal effort.  Abd:  No distention.   Other:  Dog bite noted to the posterior calf, no deep puncture wounds noted   ED Results / Procedures / Treatments   Labs (all labs ordered are listed, but only abnormal results are displayed) Labs Reviewed - No data to display   EKG     RADIOLOGY     PROCEDURES:   Procedures   MEDICATIONS ORDERED IN ED: Medications  Tdap (BOOSTRIX) injection 0.5 mL (has no administration in time range)  amoxicillin-clavulanate (AUGMENTIN) 875-125 MG per tablet 1 tablet (has no administration in time range)     IMPRESSION / MDM / ASSESSMENT AND PLAN / ED COURSE   I reviewed the triage vital signs and the nursing notes.                              Differential diagnosis includes, but is not limited to, dog bite, wound care, wound infection, rabies prophylaxis  Patient's presentation is most consistent with acute, uncomplicated illness.   Animal control was notified.  Patient will be able to identify the dogs so we will wait until the dog has been observed and if animal control feels that he needs rabies vaccines he must return emergency department.  Tdap was updated here in the ED.  He was given Augmentin 875 p.o. here in the ED.  Prescription for Augmentin 875 twice daily for 7 days to prevent infection from the dog bite.  Patient is in agreement with this treatment plan.  He was discharged in stable condition.  Did use the video interpreter for all information.      FINAL CLINICAL IMPRESSION(S) / ED  DIAGNOSES   Final diagnoses:  Dog bite, initial encounter     Rx / DC Orders   ED Discharge Orders          Ordered    amoxicillin-clavulanate (AUGMENTIN) 875-125 MG tablet  2 times daily        10/23/22 1547             Note:  This document was prepared using Dragon voice recognition software and may include unintentional dictation errors.    Versie Starks, PA-C 10/23/22 1730    Carrie Mew, MD 10/23/22 [phone]

## 2022-10-23 NOTE — ED Notes (Signed)
Call placed to [employer] by this Probation officer.

## 2022-10-23 NOTE — ED Triage Notes (Signed)
Pt here with an animal bite to his left leg. Pt does not know the animal. Bite is wrapped is gauze, not bleeding at the moment. Pt was bit [place] [address] in [place].

## 2023-03-11 IMAGING — CR DG HIP (WITH OR WITHOUT PELVIS) 2-3V*R*
3 series · 3 of 3 positions shown · non-contrast
Comparison: No priors.

CLINICAL DATA: 50-year-old male with history of trauma from a fall
complaining of right-sided hip pain.

EXAM:
DG HIP (WITH OR WITHOUT PELVIS) 2-3V RIGHT

[pelvis ap]
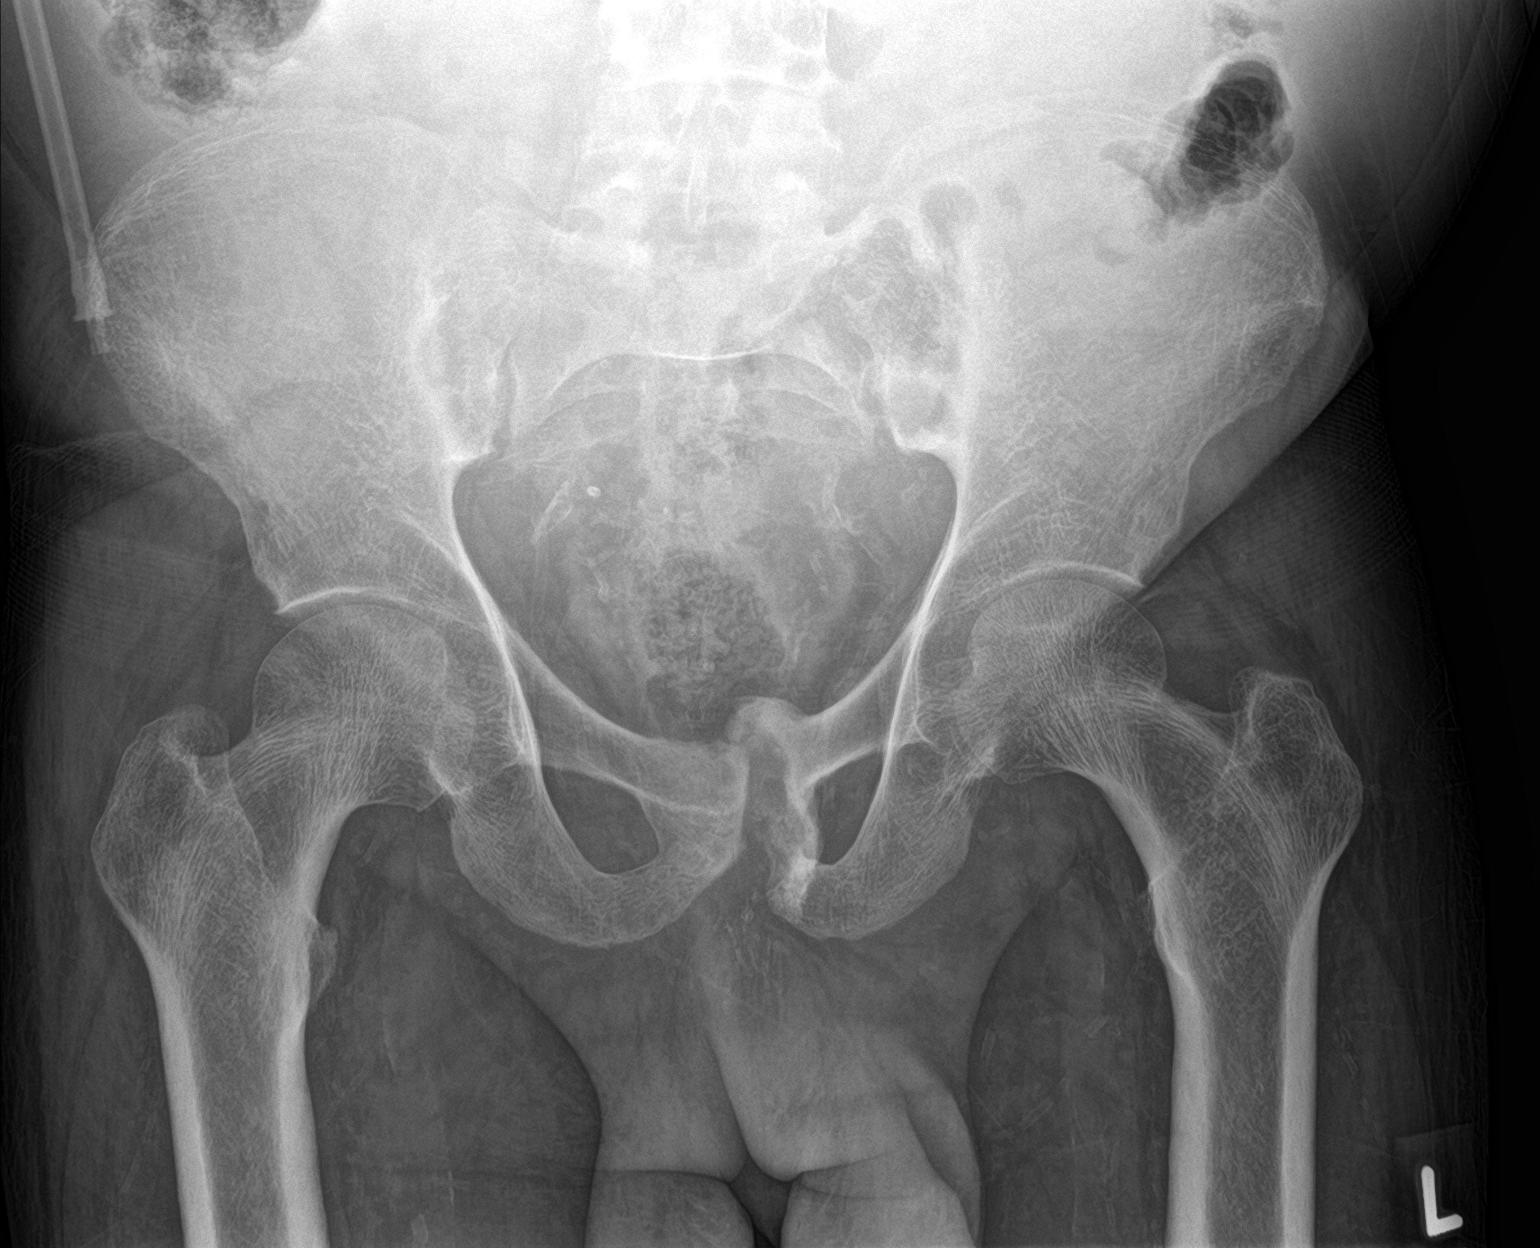

[hip ap]
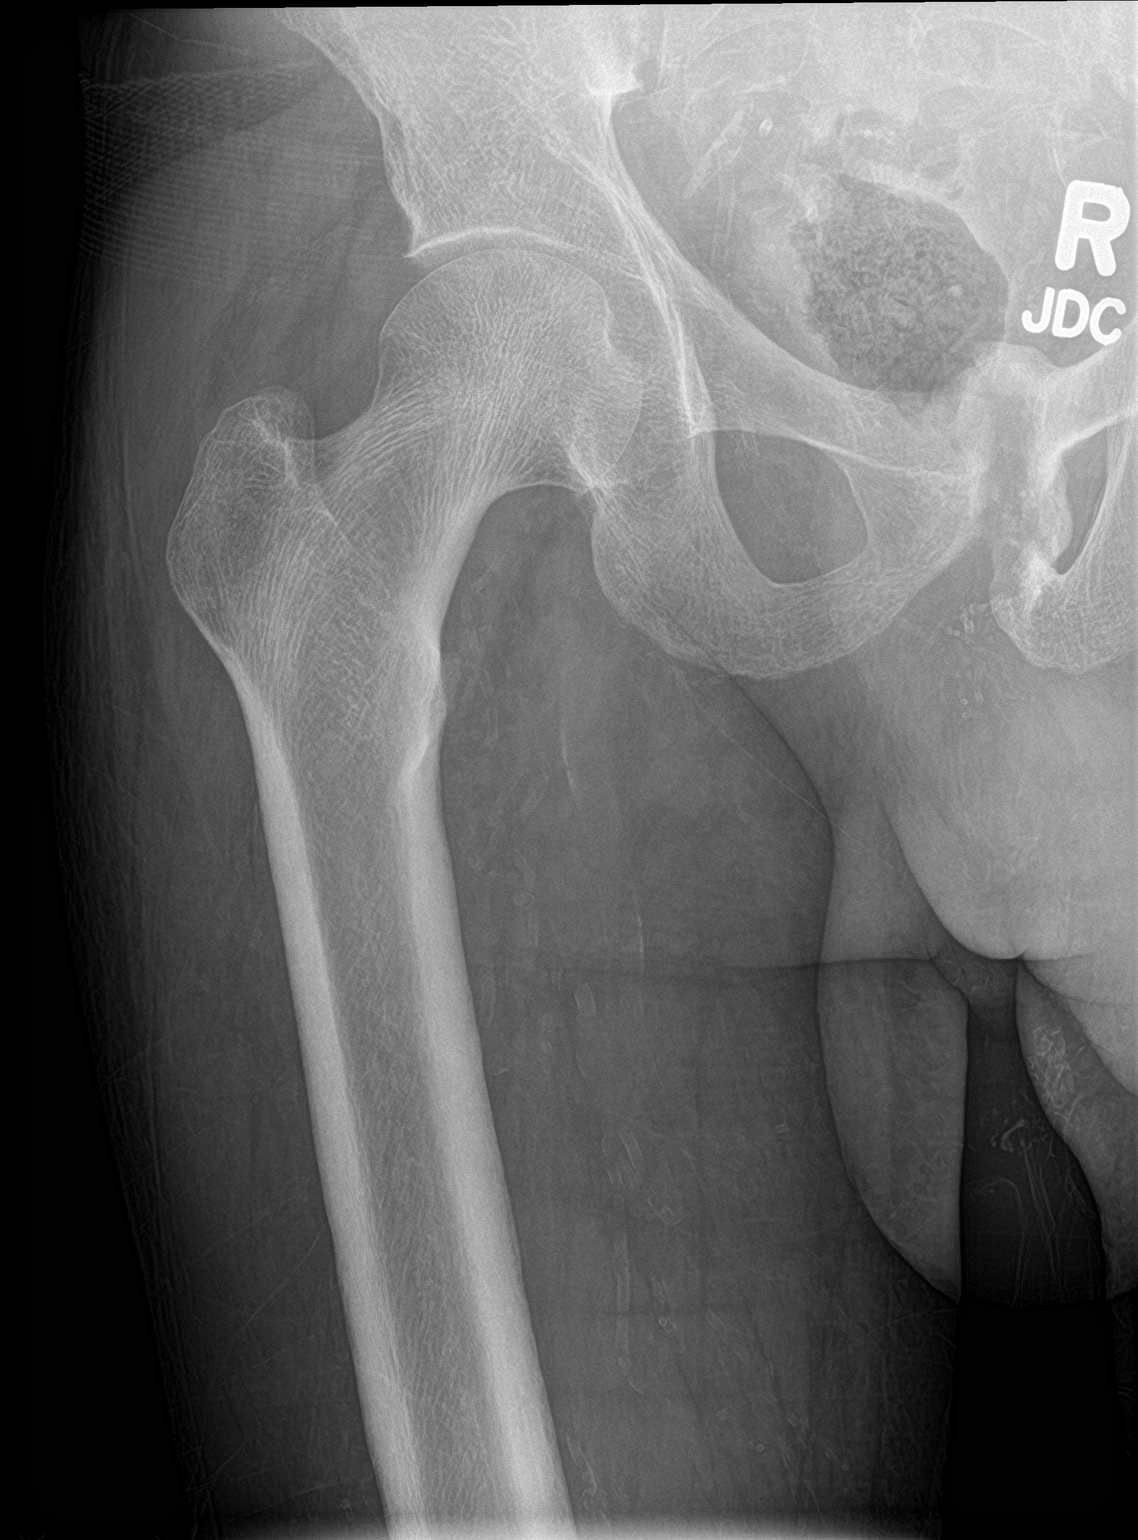

[hip lat]
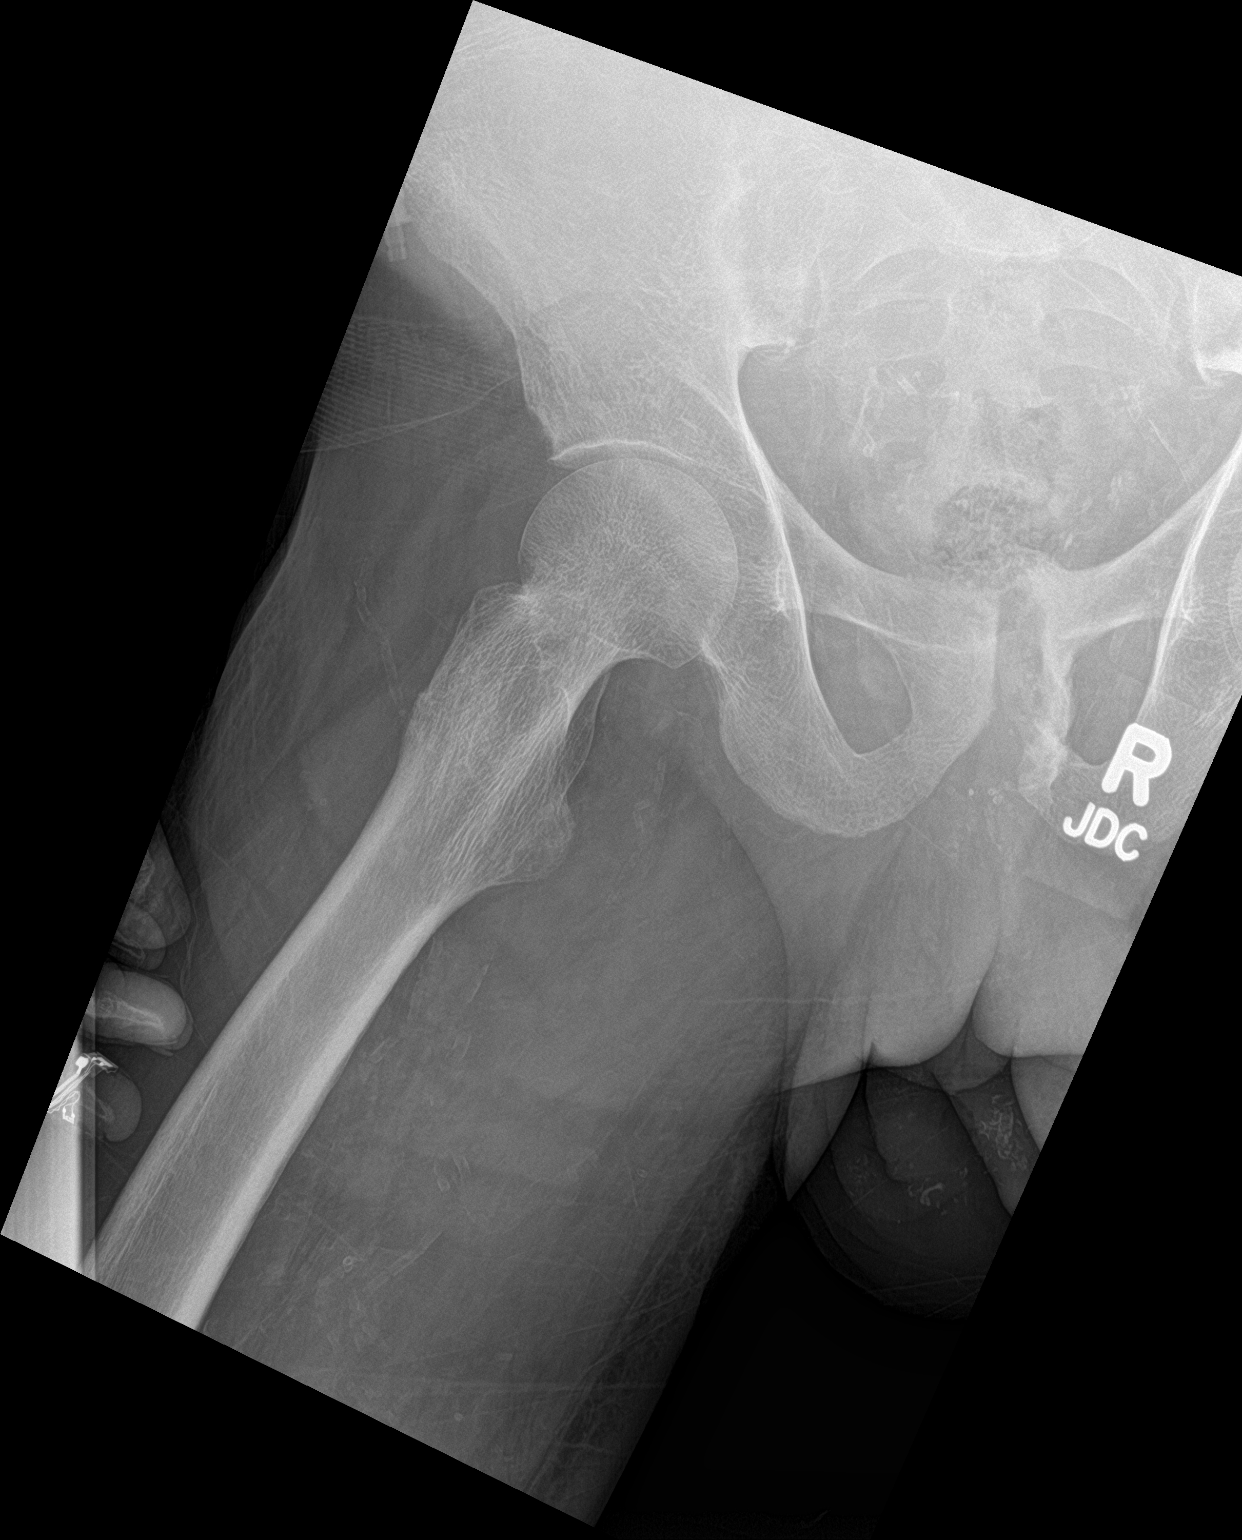

[3 of 3 positions shown; findings below may reference images not displayed]

FINDINGS: Old healed fractures of the left parasymphyseal region with
posttraumatic deformity. No acute displaced pelvic fractures are
noted. Bilateral proximal femurs as visualized appear intact. Right
femoral head is located. Mild joint space narrowing, subchondral
sclerosis and osteophyte formation in the hip joints bilaterally
indicative of osteoarthritis.
IMPRESSION: 1. No acute radiographic abnormality of the bony pelvis or right
hip.
2. Posttraumatic deformity in the left parasymphyseal region.
3. Mild bilateral hip joint osteoarthritis.

## 2023-10-13 ENCOUNTER — Emergency Department: Payer: Self-pay

## 2023-10-13 ENCOUNTER — Other Ambulatory Visit: Payer: Self-pay

## 2023-10-13 ENCOUNTER — Emergency Department
Admission: EM | Admit: 2023-10-13 | Discharge: 2023-10-14 | Disposition: A | Discharge status: Home / Self Care | Payer: Self-pay | Attending: Emergency Medicine | Admitting: Emergency Medicine

## 2023-10-13 DIAGNOSIS — L03116 Cellulitis of left lower limb: Secondary | ICD-10-CM

## 2023-10-13 DIAGNOSIS — N186 End stage renal disease: Secondary | ICD-10-CM | POA: Insufficient documentation

## 2023-10-13 DIAGNOSIS — I509 Heart failure, unspecified: Secondary | ICD-10-CM | POA: Insufficient documentation

## 2023-10-13 DIAGNOSIS — I132 Hypertensive heart and chronic kidney disease with heart failure and with stage 5 chronic kidney disease, or end stage renal disease: Secondary | ICD-10-CM | POA: Insufficient documentation

## 2023-10-13 DIAGNOSIS — Z992 Dependence on renal dialysis: Secondary | ICD-10-CM | POA: Insufficient documentation

## 2023-10-13 DIAGNOSIS — R0789 Other chest pain: Secondary | ICD-10-CM

## 2023-10-13 DIAGNOSIS — R079 Chest pain, unspecified: Secondary | ICD-10-CM | POA: Insufficient documentation

## 2023-10-13 DIAGNOSIS — L97429 Non-pressure chronic ulcer of left heel and midfoot with unspecified severity: Secondary | ICD-10-CM | POA: Insufficient documentation

## 2023-10-13 LAB — HEPATIC FUNCTION PANEL
ALT: 20 U/L (ref 0–44)
AST: 38 U/L (ref 15–41)
Albumin: 3.2 g/dL — ABNORMAL LOW (ref 3.5–5.0)
Alkaline Phosphatase: 99 U/L (ref 38–126)
Bilirubin, Direct: 0.3 mg/dL — ABNORMAL HIGH (ref 0.0–0.2)
Indirect Bilirubin: 1 mg/dL — ABNORMAL HIGH (ref 0.3–0.9)
Total Bilirubin: 1.3 mg/dL — ABNORMAL HIGH (ref 0.0–1.2)
Total Protein: 6.9 g/dL (ref 6.5–8.1)

## 2023-10-13 LAB — BASIC METABOLIC PANEL
Anion gap: 15 (ref 5–15)
BUN: 31 mg/dL — ABNORMAL HIGH (ref 6–20)
CO2: 23 mmol/L (ref 22–32)
Calcium: 9 mg/dL (ref 8.9–10.3)
Chloride: 95 mmol/L — ABNORMAL LOW (ref 98–111)
Creatinine, Ser: 5.51 mg/dL — ABNORMAL HIGH (ref 0.61–1.24)
GFR, Estimated: 12 mL/min — ABNORMAL LOW (ref >60)
Glucose, Bld: 129 mg/dL — ABNORMAL HIGH (ref 70–99)
Potassium: 4.7 mmol/L (ref 3.5–5.1)
Sodium: 133 mmol/L — ABNORMAL LOW (ref 135–145)

## 2023-10-13 LAB — LIPASE, BLOOD: Lipase: 33 U/L (ref 11–51)

## 2023-10-13 LAB — CBC
HCT: 34 % — ABNORMAL LOW (ref 39.0–52.0)
Hemoglobin: 11.1 g/dL — ABNORMAL LOW (ref 13.0–17.0)
MCH: 34.2 pg — ABNORMAL HIGH (ref 26.0–34.0)
MCHC: 32.6 g/dL (ref 30.0–36.0)
MCV: 104.6 fL — ABNORMAL HIGH (ref 80.0–100.0)
Platelets: 98 10*3/uL — ABNORMAL LOW (ref 150–400)
RBC: 3.25 MIL/uL — ABNORMAL LOW (ref 4.22–5.81)
RDW: 15 % (ref 11.5–15.5)
WBC: 9.3 10*3/uL (ref 4.0–10.5)
nRBC: 0 % (ref 0.0–0.2)

## 2023-10-13 LAB — TROPONIN I (HIGH SENSITIVITY)
Troponin I (High Sensitivity): 47 ng/L — ABNORMAL HIGH (ref <18)
Troponin I (High Sensitivity): 49 ng/L — ABNORMAL HIGH (ref <18)

## 2023-10-13 MED ORDER — ONDANSETRON HCL 4 MG/2ML IJ SOLN
4.0000 mg | Freq: Once | INTRAMUSCULAR | Status: AC
  Administered 2023-10-13: 4 mg via INTRAVENOUS
  Filled 2023-10-13: qty 2

## 2023-10-13 MED ORDER — MORPHINE SULFATE (PF) 4 MG/ML IV SOLN
4.0000 mg | Freq: Once | INTRAVENOUS | Status: AC
  Administered 2023-10-13: 4 mg via INTRAVENOUS
  Filled 2023-10-13: qty 1

## 2023-10-13 MED ORDER — CLINDAMYCIN HCL 300 MG PO CAPS: 300.0000 mg | ORAL_CAPSULE | Freq: Three times a day (TID) | ORAL | 0 refills | Status: AC

## 2023-10-13 NOTE — Discharge Instructions (Addendum)
 Please take antibiotic as prescribed for its entire course.  Please follow-up with podiatry by calling the number provided to arrange a follow-up appointment given your left foot pain and ulcer.  Return to the emergency department for any return of/worsening chest pain, trouble breathing or any other symptom personally concerning to yourself.

## 2023-10-13 NOTE — ED Provider Notes (Signed)
 [facility] Provider Note    Event Date/Time   First MD Initiated Contact with Patient 10/13/23 2046     (approximate)  History   Chief Complaint: Chest Pain, Foot Pain, and Abdominal Pain  HPI  Mario [name] is a 53 y.o. adult with a past medical history of abdominal pain, chest pain, CHF, ESRD on HD, hypertension, presents to the emergency department for chest pain and foot pain.  According to report and the patient he was at dialysis, approximately 1 hour into the session was complaining of chest pain and left foot pain so they brought him to the emergency department.  Patient states his left foot has been hurting him for the last 3 days he also states he has been experiencing intermittent chest pain over the last 1 week.  Patient's last full dialysis session was on Saturday.  Physical Exam   Triage Vital Signs: ED Triage Vitals  Encounter Vitals Group     BP 10/13/23 1850 137/68     Systolic BP Percentile --      Diastolic BP Percentile --      Pulse Rate 10/13/23 1850 77     Resp 10/13/23 1858 20     Temp 10/13/23 1850 98.6 F (37 C)     Temp Source 10/13/23 1850 Oral     SpO2 10/13/23 1850 98 %     Weight --      Height --      Head Circumference --      Peak Flow --      Pain Score 10/13/23 1851 4     Pain Loc --      Pain Education --      Exclude from Growth Chart --     Most recent vital signs: Vitals:   10/13/23 2055 10/13/23 2100  BP:  (!) 154/79  Pulse: 71 76  Resp: (!) 25 (!) 27  Temp:    SpO2: 95% 94%    General: Awake, no distress.  CV:  Good peripheral perfusion.  Regular rate and rhythm  Resp:  Normal effort.  Equal breath sounds bilaterally.  Abd:  Moderate distention although soft and nontender.  Patient has engorged veins present across the abdomen. Other:  Patient's left foot has a small ulceration to the heel with moderate tenderness to this area.  No discharge.   ED Results / Procedures / Treatments    EKG  EKG viewed and interpreted by myself shows a normal sinus rhythm at 73 bpm with a narrow QRS, left axis deviation, largely normal intervals with nonspecific but no concerning ST changes.  RADIOLOGY  I have reviewed and interpreted chest x-ray images.  No significant consolidation seen on my evaluation. Radiology is read the x-ray is chronic cardiomegaly and peribronchial thickening.   MEDICATIONS ORDERED IN ED: Medications  morphine (PF) 4 MG/ML injection 4 mg (has no administration in time range)  ondansetron (ZOFRAN) injection 4 mg (has no administration in time range)     IMPRESSION / MDM / ASSESSMENT AND PLAN / ED COURSE  I reviewed the triage vital signs and the nursing notes.  Patient's presentation is most consistent with acute presentation with potential threat to life or bodily function.  Patient presents to the emergency department with complaints of chest pain and left foot pain.  Patient was sent from his dialysis session today where he completed approximately 1 hour prior to transport.  Patient's lab work today is reassuring his potassium is 4.7.  CBC shows no significant findings normal white blood cell count.  Troponin slightly elevated at 47 however not significantly changed from historical values.  We will repeat a troponin as a precaution.  Repeat troponin is unchanged.  Workup is overall reassuring.  Pending MRI of the left foot to rule out osteomyelitis.  As long as MRI is negative anticipate the patient can be discharged home on antibiotics and follow-up with podiatry.  Patient appears much more comfortable after a dose of pain medication in the emergency department.  FINAL CLINICAL IMPRESSION(S) / ED DIAGNOSES   Left foot pain Chest pain    Note:  This document was prepared using Dragon voice recognition software and may include unintentional dictation errors.   Minna Antis, MD 10/13/23 2306

## 2023-10-13 NOTE — ED Notes (Signed)
 This RN introduced self to pt via video interpreter. Call light in reach, bed wheels locked, side rail raised, pt updated on plan of care. Rounding completed.

## 2023-10-13 NOTE — ED Notes (Addendum)
 Pt brought back to rm 5 via W/C. Pt assisted to bed by this EDT and RN. RN in room on arrival. Pt hooked up to cardiac monitor and spanish interpreter in room as well. Bed locked and in lowest position. Side rails up and call light within reach. No further needs at this time.

## 2023-10-13 NOTE — ED Triage Notes (Addendum)
 Pt to ED via POV from dialysis. Pt was sent over due to left foot pain, CP and increased tiredness.  Pt has fistula in left arm. Last dialysis tx was today for 1hr. Last full tx was Saturday.   Pt's abdomen very distended. Pt reports abd distention x8 days. Pt reports hx of liver cirrhosis. Pt denies etoh

## 2023-10-14 MED ORDER — SODIUM CHLORIDE 0.9 % IV SOLN
2.0000 g | Freq: Once | INTRAVENOUS | Status: AC
  Administered 2023-10-14: 2 g via INTRAVENOUS
  Filled 2023-10-14: qty 20

## 2023-10-14 MED ORDER — ONDANSETRON HCL 4 MG/2ML IJ SOLN
4.0000 mg | Freq: Once | INTRAMUSCULAR | Status: AC
  Administered 2023-10-14: 4 mg via INTRAVENOUS

## 2023-10-14 MED ORDER — ONDANSETRON HCL 4 MG/2ML IJ SOLN
INTRAMUSCULAR | Status: AC
  Filled 2023-10-14: qty 2

## 2023-10-14 NOTE — ED Notes (Signed)
 Pt requesting crutches. Pt states "how am I supposed to walk on my foot I need crutches". MD made aware. Crutch teaching performed with return demonstration.

## 2023-10-14 NOTE — ED Provider Notes (Signed)
  Physical Exam  BP (!) 125/93   Pulse 67   Temp 98.6 F (37 C) (Oral)   Resp 20   SpO2 95%   Physical Exam  Procedures  Procedures  ED Course / MDM   Clinical Course as of 10/14/23 0448  Wed Oct 14, 2023  0100 MRI was called to attempt to get preliminary read overnight for possible osteomyelitis. [DW]  [phone] Called MRI again.  Patient still pending preliminary/complete read of MRI of left heel for final disposition [DW]  [phone] Will give dose of IV antibiotics at this time while awaiting results of MRI [DW]  0447 Preliminary read -shows evidence of mild subcu edema but otherwise no evidence of fracture, dislocation, erosion, marrow edema, or abscess.  No evidence of osteomyelitis [DW]    Clinical Course User Index [DW] Janith Lima, MD   Medical Decision Making Amount and/or Complexity of Data Reviewed Labs: ordered. Radiology: ordered.  Risk Prescription drug management.   Received patient in signout.  53 year old male presenting today from hemodialysis for chest pain and left foot pain.  Evaluated by previous provider with reassuring cardiac workup and 2 negative troponins.  He does have an ulcer on his left heel and signout was for MRI to evaluate for possible osteomyelitis.  If this was negative, patient can be discharged with oral antibiotics.  Significant delay in getting MRI read overnight.  Called MRI to get preliminary read.  Given delay in read, decided to give patient dose of IV antibiotics.  Preliminary read shows no evidence of osteomyelitis.  Will discharge patient with outpatient follow-up with podiatry and continue oral antibiotics as prescribed by previous provider.  Patient given strict return precautions.     Janith Lima, MD 10/14/23 [phone]

## 2024-08-11 ENCOUNTER — Observation Stay: Payer: Self-pay

## 2024-08-11 ENCOUNTER — Encounter: Payer: Self-pay | Admitting: Emergency Medicine

## 2024-08-11 ENCOUNTER — Emergency Department: Payer: Self-pay

## 2024-08-11 ENCOUNTER — Other Ambulatory Visit: Payer: Self-pay

## 2024-08-11 ENCOUNTER — Inpatient Hospital Stay
Admission: EM | Admit: 2024-08-11 | Discharge: 2024-08-18 | DRG: 252 | Disposition: A | Discharge status: Home / Self Care | Payer: MEDICAID | Attending: Internal Medicine | Admitting: Internal Medicine

## 2024-08-11 DIAGNOSIS — I132 Hypertensive heart and chronic kidney disease with heart failure and with stage 5 chronic kidney disease, or end stage renal disease: Secondary | ICD-10-CM | POA: Diagnosis present

## 2024-08-11 DIAGNOSIS — M86672 Other chronic osteomyelitis, left ankle and foot: Secondary | ICD-10-CM | POA: Diagnosis present

## 2024-08-11 DIAGNOSIS — M2042 Other hammer toe(s) (acquired), left foot: Secondary | ICD-10-CM | POA: Diagnosis present

## 2024-08-11 DIAGNOSIS — N186 End stage renal disease: Secondary | ICD-10-CM | POA: Diagnosis present

## 2024-08-11 DIAGNOSIS — Z883 Allergy status to other anti-infective agents status: Secondary | ICD-10-CM

## 2024-08-11 DIAGNOSIS — I5032 Chronic diastolic (congestive) heart failure: Secondary | ICD-10-CM | POA: Diagnosis present

## 2024-08-11 DIAGNOSIS — I8510 Secondary esophageal varices without bleeding: Secondary | ICD-10-CM | POA: Diagnosis present

## 2024-08-11 DIAGNOSIS — K766 Portal hypertension: Secondary | ICD-10-CM | POA: Diagnosis present

## 2024-08-11 DIAGNOSIS — Z5982 Transportation insecurity: Secondary | ICD-10-CM

## 2024-08-11 DIAGNOSIS — K7031 Alcoholic cirrhosis of liver with ascites: Secondary | ICD-10-CM | POA: Diagnosis present

## 2024-08-11 DIAGNOSIS — L03114 Cellulitis of left upper limb: Secondary | ICD-10-CM | POA: Diagnosis present

## 2024-08-11 DIAGNOSIS — F17210 Nicotine dependence, cigarettes, uncomplicated: Secondary | ICD-10-CM | POA: Diagnosis present

## 2024-08-11 DIAGNOSIS — Z5941 Food insecurity: Secondary | ICD-10-CM

## 2024-08-11 DIAGNOSIS — D696 Thrombocytopenia, unspecified: Secondary | ICD-10-CM | POA: Diagnosis present

## 2024-08-11 DIAGNOSIS — Z603 Acculturation difficulty: Secondary | ICD-10-CM | POA: Diagnosis present

## 2024-08-11 DIAGNOSIS — G629 Polyneuropathy, unspecified: Secondary | ICD-10-CM | POA: Diagnosis present

## 2024-08-11 DIAGNOSIS — I70222 Atherosclerosis of native arteries of extremities with rest pain, left leg: Principal | ICD-10-CM | POA: Diagnosis present

## 2024-08-11 DIAGNOSIS — M869 Osteomyelitis, unspecified: Secondary | ICD-10-CM | POA: Diagnosis present

## 2024-08-11 DIAGNOSIS — M159 Polyosteoarthritis, unspecified: Secondary | ICD-10-CM | POA: Diagnosis present

## 2024-08-11 DIAGNOSIS — D631 Anemia in chronic kidney disease: Secondary | ICD-10-CM | POA: Diagnosis present

## 2024-08-11 DIAGNOSIS — L97529 Non-pressure chronic ulcer of other part of left foot with unspecified severity: Secondary | ICD-10-CM | POA: Diagnosis present

## 2024-08-11 DIAGNOSIS — I7025 Atherosclerosis of native arteries of other extremities with ulceration: Secondary | ICD-10-CM | POA: Diagnosis present

## 2024-08-11 DIAGNOSIS — Z79899 Other long term (current) drug therapy: Secondary | ICD-10-CM

## 2024-08-11 DIAGNOSIS — Z833 Family history of diabetes mellitus: Secondary | ICD-10-CM

## 2024-08-11 DIAGNOSIS — L089 Local infection of the skin and subcutaneous tissue, unspecified: Secondary | ICD-10-CM

## 2024-08-11 DIAGNOSIS — L03119 Cellulitis of unspecified part of limb: Secondary | ICD-10-CM

## 2024-08-11 DIAGNOSIS — D684 Acquired coagulation factor deficiency: Secondary | ICD-10-CM | POA: Diagnosis present

## 2024-08-11 DIAGNOSIS — I70245 Atherosclerosis of native arteries of left leg with ulceration of other part of foot: Secondary | ICD-10-CM | POA: Diagnosis present

## 2024-08-11 DIAGNOSIS — E785 Hyperlipidemia, unspecified: Secondary | ICD-10-CM | POA: Diagnosis present

## 2024-08-11 DIAGNOSIS — Z8419 Family history of other disorders of kidney and ureter: Secondary | ICD-10-CM

## 2024-08-11 DIAGNOSIS — D539 Nutritional anemia, unspecified: Secondary | ICD-10-CM | POA: Diagnosis present

## 2024-08-11 DIAGNOSIS — M009 Pyogenic arthritis, unspecified: Secondary | ICD-10-CM | POA: Diagnosis present

## 2024-08-11 DIAGNOSIS — M79672 Pain in left foot: Secondary | ICD-10-CM | POA: Diagnosis present

## 2024-08-11 DIAGNOSIS — L98499 Non-pressure chronic ulcer of skin of other sites with unspecified severity: Secondary | ICD-10-CM | POA: Diagnosis present

## 2024-08-11 DIAGNOSIS — R161 Splenomegaly, not elsewhere classified: Secondary | ICD-10-CM | POA: Diagnosis present

## 2024-08-11 DIAGNOSIS — I998 Other disorder of circulatory system: Secondary | ICD-10-CM | POA: Diagnosis present

## 2024-08-11 DIAGNOSIS — I739 Peripheral vascular disease, unspecified: Principal | ICD-10-CM

## 2024-08-11 DIAGNOSIS — M86172 Other acute osteomyelitis, left ankle and foot: Secondary | ICD-10-CM

## 2024-08-11 DIAGNOSIS — Z992 Dependence on renal dialysis: Secondary | ICD-10-CM

## 2024-08-11 DIAGNOSIS — Z5989 Other problems related to housing and economic circumstances: Secondary | ICD-10-CM

## 2024-08-11 LAB — LIPID PANEL
Cholesterol: 116 mg/dL (ref 0–200)
HDL: 50 mg/dL
LDL Cholesterol: 56 mg/dL (ref 0–99)
Total CHOL/HDL Ratio: 2.3 ratio
Triglycerides: 50 mg/dL
VLDL: 10 mg/dL (ref 0–40)

## 2024-08-11 LAB — CBC WITH DIFFERENTIAL/PLATELET
Abs Immature Granulocytes: 0.02 K/uL (ref 0.00–0.07)
Basophils Absolute: 0.1 K/uL (ref 0.0–0.1)
Basophils Relative: 1 %
Eosinophils Absolute: 0.3 K/uL (ref 0.0–0.5)
Eosinophils Relative: 5 %
HCT: 39 % (ref 39.0–52.0)
Hemoglobin: 12.5 g/dL — ABNORMAL LOW (ref 13.0–17.0)
Immature Granulocytes: 0 %
Lymphocytes Relative: 11 %
Lymphs Abs: 0.6 K/uL — ABNORMAL LOW (ref 0.7–4.0)
MCH: 33.3 pg (ref 26.0–34.0)
MCHC: 32.1 g/dL (ref 30.0–36.0)
MCV: 104 fL — ABNORMAL HIGH (ref 80.0–100.0)
Monocytes Absolute: 0.6 K/uL (ref 0.1–1.0)
Monocytes Relative: 10 %
Neutro Abs: 3.8 K/uL (ref 1.7–7.7)
Neutrophils Relative %: 73 %
Platelets: 105 K/uL — ABNORMAL LOW (ref 150–400)
RBC: 3.75 MIL/uL — ABNORMAL LOW (ref 4.22–5.81)
RDW: 15.2 % (ref 11.5–15.5)
WBC: 5.3 K/uL (ref 4.0–10.5)
nRBC: 0 % (ref 0.0–0.2)

## 2024-08-11 LAB — COMPREHENSIVE METABOLIC PANEL WITH GFR
ALT: 13 U/L (ref 0–44)
AST: 36 U/L (ref 15–41)
Albumin: 4 g/dL (ref 3.5–5.0)
Alkaline Phosphatase: 188 U/L — ABNORMAL HIGH (ref 38–126)
Anion gap: 16 — ABNORMAL HIGH (ref 5–15)
BUN: 40 mg/dL — ABNORMAL HIGH (ref 6–20)
CO2: 25 mmol/L (ref 22–32)
Calcium: 9.8 mg/dL (ref 8.9–10.3)
Chloride: 94 mmol/L — ABNORMAL LOW (ref 98–111)
Creatinine, Ser: 7.12 mg/dL — ABNORMAL HIGH (ref 0.61–1.24)
GFR, Estimated: 8 mL/min — ABNORMAL LOW
Glucose, Bld: 73 mg/dL (ref 70–99)
Potassium: 4.5 mmol/L (ref 3.5–5.1)
Sodium: 135 mmol/L (ref 135–145)
Total Bilirubin: 0.8 mg/dL (ref 0.0–1.2)
Total Protein: 7.6 g/dL (ref 6.5–8.1)

## 2024-08-11 LAB — URIC ACID: Uric Acid, Serum: 5 mg/dL (ref 3.7–8.6)

## 2024-08-11 LAB — MRSA NEXT GEN BY PCR, NASAL: MRSA by PCR Next Gen: NOT DETECTED

## 2024-08-11 LAB — LACTIC ACID, PLASMA: Lactic Acid, Venous: 0.9 mmol/L (ref 0.5–1.9)

## 2024-08-11 LAB — ETHANOL: Alcohol, Ethyl (B): <15 mg/dL

## 2024-08-11 LAB — SEDIMENTATION RATE: Sed Rate: 18 mm/h (ref 0–20)

## 2024-08-11 LAB — APTT: aPTT: 39 s — ABNORMAL HIGH (ref 24–36)

## 2024-08-11 LAB — C-REACTIVE PROTEIN: CRP: 3.4 mg/dL — ABNORMAL HIGH

## 2024-08-11 MED ORDER — ACETAMINOPHEN 650 MG RE SUPP: 650.0000 mg | Freq: Four times a day (QID) | RECTAL | Status: DC | PRN

## 2024-08-11 MED ORDER — HEPARIN SODIUM (PORCINE) 5000 UNIT/ML IJ SOLN
5000.0000 [IU] | Freq: Two times a day (BID) | INTRAMUSCULAR | Status: DC
  Administered 2024-08-11 – 2024-08-17 (×14): 5000 [IU] via SUBCUTANEOUS
  Filled 2024-08-11 (×14): qty 1

## 2024-08-11 MED ORDER — ONDANSETRON HCL 4 MG/2ML IJ SOLN
4.0000 mg | Freq: Once | INTRAMUSCULAR | Status: AC
  Administered 2024-08-11: 4 mg via INTRAVENOUS
  Filled 2024-08-11: qty 2

## 2024-08-11 MED ORDER — SODIUM CHLORIDE 0.9 % IV SOLN
1.0000 g | Freq: Once | INTRAVENOUS | Status: AC
  Administered 2024-08-11: 1 g via INTRAVENOUS
  Filled 2024-08-11: qty 10

## 2024-08-11 MED ORDER — MORPHINE SULFATE (PF) 4 MG/ML IV SOLN
4.0000 mg | Freq: Once | INTRAVENOUS | Status: AC
  Administered 2024-08-11: 4 mg via INTRAVENOUS
  Filled 2024-08-11: qty 1

## 2024-08-11 MED ORDER — ONDANSETRON HCL 4 MG/2ML IJ SOLN
4.0000 mg | Freq: Four times a day (QID) | INTRAMUSCULAR | Status: DC | PRN
  Administered 2024-08-11 – 2024-08-14 (×3): 4 mg via INTRAVENOUS
  Filled 2024-08-11 (×3): qty 2

## 2024-08-11 MED ORDER — CHLORHEXIDINE GLUCONATE CLOTH 2 % EX PADS
6.0000 | MEDICATED_PAD | Freq: Every day | CUTANEOUS | Status: DC
  Administered 2024-08-11 – 2024-08-12 (×2): 6 via TOPICAL
  Filled 2024-08-11: qty 6

## 2024-08-11 MED ORDER — ACETAMINOPHEN 325 MG PO TABS
650.0000 mg | ORAL_TABLET | Freq: Four times a day (QID) | ORAL | Status: DC | PRN
  Administered 2024-08-18: 650 mg via ORAL
  Filled 2024-08-11: qty 2

## 2024-08-11 MED ORDER — PREDNISOLONE ACETATE 1 % OP SUSP: 1.0000 [drp] | OPHTHALMIC | Status: DC | PRN

## 2024-08-11 MED ORDER — AMLODIPINE BESYLATE 5 MG PO TABS
5.0000 mg | ORAL_TABLET | Freq: Every day | ORAL | Status: DC
  Administered 2024-08-11 – 2024-08-17 (×5): 5 mg via ORAL
  Filled 2024-08-11 (×6): qty 1

## 2024-08-11 MED ORDER — NEPRO/CARBSTEADY PO LIQD
237.0000 mL | Freq: Two times a day (BID) | ORAL | Status: DC
  Administered 2024-08-11 – 2024-08-17 (×6): 237 mL via ORAL

## 2024-08-11 MED ORDER — ONDANSETRON HCL 4 MG PO TABS: 4.0000 mg | ORAL_TABLET | Freq: Four times a day (QID) | ORAL | Status: DC | PRN

## 2024-08-11 MED ORDER — IOHEXOL 350 MG/ML SOLN
100.0000 mL | Freq: Once | INTRAVENOUS | Status: AC | PRN
  Administered 2024-08-11: 100 mL via INTRAVENOUS

## 2024-08-11 MED ORDER — SENNOSIDES-DOCUSATE SODIUM 8.6-50 MG PO TABS
1.0000 | ORAL_TABLET | Freq: Every evening | ORAL | Status: DC | PRN
  Administered 2024-08-12: 1 via ORAL
  Filled 2024-08-11: qty 1

## 2024-08-11 MED ORDER — HYDROMORPHONE HCL 1 MG/ML IJ SOLN
0.5000 mg | INTRAMUSCULAR | Status: DC | PRN
  Administered 2024-08-11 – 2024-08-12 (×4): 1 mg via INTRAVENOUS
  Administered 2024-08-12: 0.5 mg via INTRAVENOUS
  Administered 2024-08-12 – 2024-08-13 (×6): 1 mg via INTRAVENOUS
  Filled 2024-08-11 (×10): qty 1

## 2024-08-11 MED ORDER — IPRATROPIUM-ALBUTEROL 0.5-2.5 (3) MG/3ML IN SOLN: 3.0000 mL | Freq: Once | RESPIRATORY_TRACT | Status: DC

## 2024-08-11 MED ORDER — TRAZODONE HCL 50 MG PO TABS
25.0000 mg | ORAL_TABLET | Freq: Every evening | ORAL | Status: DC | PRN
  Administered 2024-08-11 – 2024-08-16 (×4): 25 mg via ORAL
  Filled 2024-08-11 (×4): qty 1

## 2024-08-11 MED ORDER — LORAZEPAM 2 MG/ML IJ SOLN
0.5000 mg | Freq: Four times a day (QID) | INTRAMUSCULAR | Status: DC | PRN
  Administered 2024-08-13: 0.5 mg via INTRAVENOUS
  Filled 2024-08-11: qty 1

## 2024-08-11 NOTE — Progress Notes (Signed)
"  Attempted to see patient earlier today due to consult for continuation of dialysis. Patient not in room. Orders placed for dialysis treatment however patient refused. Will see patient in am. "

## 2024-08-11 NOTE — Progress Notes (Signed)
 Discussed with on-call podiatry Dr. Lennie regarding the MRI of the left foot, podiatry recommended that as patient's ESR and CRP both low, MRI finding may just reflects ischemic changes.  Podiatry recommended as well as patient does not fever or leukocytosis we can hold off redosing antibiotics.

## 2024-08-11 NOTE — ED Triage Notes (Addendum)
 Pt arrives via EMS from [facility] dialysis for pain & swelling to left foot x 8 days, worsening last 3 days; taking tylenol  without relief; foot warm but unable to manually palpate pulses, foot discolored, edematous

## 2024-08-11 NOTE — H&P (Signed)
 " History and Physical    Mario [name] FMW:[id number] DOB: 1971-07-06 DOA: 08/11/2024  PCP: Lateef, Munsoor, MD (Confirm with patient/family/NH records and if not entered, this has to be entered at [facility] point of entry) Patient coming from: Home  I have personally briefly reviewed patient's old medical records in [facility]  Chief Complaint: Left foot pain, left hand nonhealing ulcer  HPI: Mario [name] is a 54 y.o. adult with medical history significant of ESRD and HD TTS, HTN HLD, presented with worsening of left foot pain and nonhealing wound of left hand.  Patient complains about having left foot pain for 7+ days, quality getting worse, last 2 days even putting weight on left foot can trigger significant pain.  He pointed the pain mainly to the 5 toes especially the big toe and  pinky toe, he has a small ulcer on lateral side of the pinky toe.  Denies any history of claudications.  Meantime he also complained about a unhealing wound of left hand, located on the extension side of the MCP joint, this has been a more chronic issue and he reported that he has not been able to bend left middle finger for several months.  Denied any fever or chills.  Last dialysis was last Tuesday.  ED Course: Afebrile, nontachycardic blood pressure 164/73 O2 saturation 100% on room air.  CTA of abdomen pelvis bifurcation lower extremity showed no significant occlusion or stenosis of major vessels but heavy calcification of medium and small vessels.  Blood work showed WBC 5.3 hemoglobin 12.5 BUN 40 creatinine 7.1 K4.5 bicarb 25.  Vascular surgery consulted.  Review of Systems: As per HPI otherwise 14 point review of systems negative.    Past Medical History:  Diagnosis Date   Abdominal pain 08/28/2016   Acute hyperkalemia 10/28/2016   Chest pain 04/11/2016   Chronic combined systolic and diastolic CHF (congestive heart failure) (HCC)    Cirrhosis (HCC)    Dialysis patient     Edentulous    ESRD (end stage renal disease) on dialysis (HCC) 01/25/2015   Fluid overload 04/12/2016   HTN (hypertension) 10/13/2016   Hyperkalemia 11/27/2016   Hypertension    Mitral regurgitation    Pulmonary edema 07/03/2016   Renal disorder    Renal insufficiency    Scrotal infection 01/25/2015    Past Surgical History:  Procedure Laterality Date   ANTERIOR CERVICAL CORPECTOMY N/A 04/05/2019   Procedure: ANTERIOR CERVICAL CORPECTOMY Cervical six and Cervical seven with strut graft fusion from Cervical five-Thoracic one   ;  Surgeon: Louis Shove, MD;  Location[facility] OR;  Service: Neurosurgery;  Laterality: N/A;   AV FISTULA PLACEMENT     POSTERIOR CERVICAL FUSION/FORAMINOTOMY N/A 04/05/2019   Procedure: Posterior cervical fusion utilizing segmental instrumentation from Cervical five-Thoracic one ;  Surgeon: Louis Shove, MD;  Location: [facility] OR;  Service: Neurosurgery;  Laterality: N/A;     reports that she has been smoking cigarettes. She has a 6.3 pack-year smoking history. She has never used smokeless tobacco. She reports that she does not drink alcohol and does not use drugs.  Allergies[1]  Family History  Problem Relation Age of Onset   Kidney failure Father    Diabetes Mother      Prior to Admission medications  Medication Sig Start Date End Date Taking? Authorizing Provider  amLODipine  (NORVASC ) 5 MG tablet Take 1 tablet (5 mg total) by mouth daily. 10/18/21 02/03/22  Krishnan, Gokul, MD  amoxicillin -clavulanate (AUGMENTIN ) 875-125 MG tablet Take  1 tablet by mouth 2 (two) times daily. 10/23/22   Fisher, Devere ORN, PA-C  ferric citrate  (AURYXIA ) 1 GM 210 MG(Fe) tablet Take 420 mg by mouth 3 (three) times daily with meals.    [provider]  prednisoLONE  acetate (PRED FORTE ) 1 % ophthalmic suspension Place 1-2 drops into the right eye every 4 (four) hours as needed. 01/16/22   [provider]    Physical Exam: Vitals:   08/11/24 0648 08/11/24 0649 08/11/24 1050  08/11/24 1051  BP:  (!) 164/73 (!) 164/78   Pulse:  65 67   Resp:  18 20   Temp:  98.3 F (36.8 C)  98.1 F (36.7 C)  TempSrc:  Oral  Oral  SpO2:  100% 100%   Weight: 74.8 kg     Height: 5' 2.99 (1.6 m)       Constitutional: NAD, calm, comfortable Vitals:   08/11/24 0648 08/11/24 0649 08/11/24 1050 08/11/24 1051  BP:  (!) 164/73 (!) 164/78   Pulse:  65 67   Resp:  18 20   Temp:  98.3 F (36.8 C)  98.1 F (36.7 C)  TempSrc:  Oral  Oral  SpO2:  100% 100%   Weight: 74.8 kg     Height: 5' 2.99 (1.6 m)      Eyes: PERRL, lids and conjunctivae normal ENMT: Mucous membranes are moist. Posterior pharynx clear of any exudate or lesions.Normal dentition.  Neck: normal, supple, no masses, no thyromegaly Respiratory: clear to auscultation bilaterally, no wheezing, no crackles. Normal respiratory effort. No accessory muscle use.  Cardiovascular: Regular rate and rhythm, no murmurs / rubs / gallops. No extremity edema. 2+ pedal pulses. No carotid bruits.  Abdomen: no tenderness, no masses palpated. No hepatosplenomegaly. Bowel sounds positive.  Musculoskeletal: no clubbing / cyanosis. No joint deformity upper and lower extremities. Good ROM, no contractures. Normal muscle tone.  Skin: Small ulcer covered with eschar located on the lateral side of MTP joint of left pinky toe, severe tenderness of 5 toes left side.  A chronic ulcer on left hand, dorsal side of MCP joint of middle finger, unable to bend left middle finger Neurologic: CN 2-12 grossly intact. Sensation intact, DTR normal. Strength 5/5 in all 4.  Psychiatric: Normal judgment and insight. Alert and oriented x 3. Normal mood.    Labs on Admission: I have personally reviewed following labs and imaging studies  CBC: Recent Labs  Lab 08/11/24 0726  WBC 5.3  NEUTROABS 3.8  HGB 12.5*  HCT 39.0  MCV 104.0*  PLT 105*   Basic Metabolic Panel: Recent Labs  Lab 08/11/24 0726  NA 135  K 4.5  CL 94*  CO2 25  GLUCOSE 73   BUN 40*  CREATININE 7.12*  CALCIUM  9.8   GFR: Estimated Creatinine Clearance (by C-G formula based on SCr of 7.12 mg/dL (H)) Male: 8.8 mL/min (A) Male: 10.8 mL/min (A) Liver Function Tests: Recent Labs  Lab 08/11/24 0726  AST 36  ALT 13  ALKPHOS 188*  BILITOT 0.8  PROT 7.6  ALBUMIN 4.0   No results for input(s): LIPASE, AMYLASE in the last 168 hours. No results for input(s): AMMONIA in the last 168 hours. Coagulation Profile: No results for input(s): INR, PROTIME in the last 168 hours. Cardiac Enzymes: No results for input(s): CKTOTAL, CKMB, CKMBINDEX, TROPONINI in the last 168 hours. BNP (last 3 results) No results for input(s): PROBNP in the last 8760 hours. HbA1C: No results for input(s): HGBA1C in the last 72  hours. CBG: No results for input(s): GLUCAP in the last 168 hours. Lipid Profile: No results for input(s): CHOL, HDL, LDLCALC, TRIG, CHOLHDL, LDLDIRECT in the last 72 hours. Thyroid Function Tests: No results for input(s): TSH, T4TOTAL, FREET4, T3FREE, THYROIDAB in the last 72 hours. Anemia Panel: No results for input(s): VITAMINB12, FOLATE, FERRITIN, TIBC, IRON , RETICCTPCT in the last 72 hours. Urine analysis: No results found for: COLORURINE, APPEARANCEUR, LABSPEC, PHURINE, GLUCOSEU, HGBUR, BILIRUBINUR, KETONESUR, PROTEINUR, UROBILINOGEN, NITRITE, LEUKOCYTESUR  Radiological Exams on Admission: CT Angio Aortobifemoral W and/or Wo Contrast Result Date: 08/11/2024 CLINICAL DATA:  Ischemic left foot. End-stage renal disease on hemodialysis. EXAM: CT ANGIOGRAPHY OF ABDOMINAL AORTA WITH ILIOFEMORAL RUNOFF TECHNIQUE: Multidetector CT imaging of the abdomen, pelvis and lower extremities was performed using the standard protocol during bolus administration of intravenous contrast. Multiplanar CT image reconstructions and MIPs were obtained to evaluate the vascular anatomy. RADIATION DOSE  REDUCTION: This exam was performed according to the departmental dose-optimization program which includes automated exposure control, adjustment of the mA and/or kV according to patient size and/or use of iterative reconstruction technique. CONTRAST:  OMNIPAQUE  IOHEXOL  350 MG/ML SOLN COMPARISON:  Prior CT abdomen/pelvis 02/03/2022 FINDINGS: VASCULAR Aorta: Normal caliber aorta without aneurysm, dissection, vasculitis or significant stenosis. Celiac: Patent without evidence of aneurysm, dissection, vasculitis or significant stenosis. SMA: Patent without evidence of aneurysm, dissection, vasculitis or significant stenosis. Renals: Hypoplastic renal arteries due to longstanding chronic renal failure and renal atrophy. IMA: Moderate to high-grade stenosis of the proximal IMA. The vessel remains patent. RIGHT Lower Extremity Inflow: Common, internal and external iliac arteries are patent without evidence of aneurysm, dissection, vasculitis or significant stenosis. Outflow: Common, superficial and profunda femoral arteries and the popliteal artery are patent without evidence of aneurysm, dissection, vasculitis or significant stenosis. Runoff: Limited evaluation of the runoff arteries due to extensive medial arteriosclerosis. The circumferential calcium  in the small vessels limits evaluation of the arterial lumen. LEFT Lower Extremity Inflow: Common, internal and external iliac arteries are patent without evidence of aneurysm, dissection, vasculitis or significant stenosis. Outflow: Common, superficial and profunda femoral arteries and the popliteal artery are patent without evidence of aneurysm, dissection, vasculitis or significant stenosis. Runoff: Limited evaluation of the runoff arteries due to extensive medial arteriosclerosis. The circumferential calcium  in the small vessels limits evaluation of the arterial lumen. Veins: Limited evaluation given arterial phase timing of the exam. There is peripheral  calcification of the inferior vena cava and bilateral iliac veins which may represent chronic calcinosis, or potentially sequelae of prior ileo caval DVT. Changes are stable over many years. Review of the MIP images confirms the above findings. NON-VASCULAR Lower chest: No acute abnormality. Mild cardiomegaly. Calcifications along the coronary arteries. Large para esophageal varices. Hepatobiliary: Advanced hepatic cirrhosis. No arterially enhancing lesion to suggest the presence of hepatocellular carcinoma. Gallbladder is unremarkable. No intra or extrahepatic biliary ductal dilatation. Pancreas: Unremarkable. No pancreatic ductal dilatation or surrounding inflammatory changes. Spleen: Mild splenomegaly. Adrenals/Urinary Tract: Unremarkable adrenal glands. Atrophied native kidneys. The bladder is decompressed. Stomach/Bowel: No focal bowel wall thickening or evidence of obstruction. Lymphatic: No suspicious lymphadenopathy. Reproductive: Unremarkable prostate gland. Other: Moderate ascites. Musculoskeletal: No acute fracture or aggressive appearing lytic or blastic osseous lesion. IMPRESSION: VASCULAR 1. No evidence of hemodynamically significant stenosis or occlusive disease in the inflow (aortoiliac) or outflow (femoropopliteal) segments. Evaluation of the runoff (infrageniculate) arteries is limited by severe medial arteriosclerosis. The circumferential calcification of the medium and small artery walls creates streak artifact which severely limits evaluation of  the internal lumens. Catheter directed angiography could be considered if clinically warranted. 2. Extensive peripheral calcified atherosclerotic plaque. 3. Extensive peripheral calcification of the inferior vena cava and bilateral iliac vessels, similar compared to prior. 4. No evidence of arterial aneurysm or dissection. NON-VASCULAR 1. Severe hepatic cirrhosis with splenomegaly, esophageal and paraesophageal varices and moderate ascites. 2. No acute  abnormality within the abdomen or pelvis. 3. Similar appearance of atrophic native kidneys. Electronically Signed   By: Wilkie Lent M.D.   On: 08/11/2024 10:25   US  Venous Img Lower Unilateral Left Result Date: 08/11/2024 EXAM: ULTRASOUND DUPLEX OF THE LEFT LOWER EXTREMITY VEINS TECHNIQUE: Duplex ultrasound using B-mode/gray scaled imaging and Doppler spectral analysis and color flow was obtained of the deep venous structures of the left lower extremity. COMPARISON: None available. CLINICAL HISTORY: 54 year old male with lower extremity pain and color changes. FINDINGS: The common femoral vein, femoral vein, popliteal vein, and visible calf veins demonstrate normal compressibility with normal color flow and spectral analysis. Evidence of calcified peripheral vascular disease. The right contralateral common femoral vein appears patent and unremarkable. IMPRESSION: 1. No evidence of left lower extremity DVT. 2. Suspect  calcified peripheral vascular disease in the left lower extremity. Electronically signed by: Helayne Hurst MD MD 08/11/2024 09:35 AM EST RP Workstation: [id number]   DG Foot 2 Views Left Result Date: 08/11/2024 EXAM: 2 VIEW(S) XRAY OF THE LEFT FOOT 08/11/2024 07:32:00 AM COMPARISON: CT left foot 01/22/2018. CLINICAL HISTORY: pain pain FINDINGS: BONES AND JOINTS: Osteopenia. There is a chronic healed fracture deformity of the neck of the 5th metatarsal. No acute fracture. No findings of acute osteomyelitis. No malalignment. Arthritic changes are not seen. SOFT TISSUES: There are extensive vascular calcifications of the distal foreleg and foot. Mild generalized edema. There is mild fusiform thickening in the distal achilles tendon. There is no visible soft tissue gas or foreign body. IMPRESSION: 1. No acute osseous abnormality. 2. Extensive vascular calcifications in the distal foreleg and foot. 3. Soft tissue swelling. 4. Osteopenia. Electronically signed by: Francis Quam MD 08/11/2024 07:40 AM  EST RP Workstation: [id number]    EKG: Independently reviewed.  Sinus, RBBB, which appears to be new, no acute ST changes.  Assessment/Plan Principal Problem:   Osteomyelitis (HCC) Active Problems:   Chronic osteomyelitis of left foot (HCC)  (please populate well all problems here in Problem List. (For example, if patient is on BP meds at home and you resume or decide to hold them, it is a problem that needs to be her. Same for CAD, COPD, HLD and so on)  Acute left foot pain - Suspect claudication and underlying PVD given the suspected finding of CTA showing severe calcification of lower extremities involving medium and small vessels.  Vascular surgeon consulted. - Check lipid panel - Other DDx, given that there is a ulcer on the lateral side of fifth MCP joint, will also order a MRI to rule out acute osteomyelitis.  No signs of systemic inflammation, no fever and no leukocytosis, monitor off antibiotic for now. - Other differentials such as gout, calciphylaxis also need to be rule out.  Will order uric acid level.  Case was discussed with nephrology who will follow-up along.  Also appears that patient developed hammertoes of at least 4 out of 5 toes on the left foot.  Consider outpatient podiatry follow-up if acute osteomyelitis and vascular issues rule out.  Left hand nonhealing ulcer - Clinically suspect underlying chronic osteomyelitis versus septic arthritis - Will check MRI of the left  hand - No signs of systemic inflammation, no fever and no leukocytosis, monitor off antibiotic for now.  HTN - Continue amlodipine   ESRD on HD TTS - Nephrology consulted for routine dialysis.  DVT prophylaxis: Heparin  subcu Code Status: Full code Family Communication: None at bedside Disposition Plan: Expect less than 2 midnight hospital stay if osteomyelitis is ruled out. Consults called: Vascular surgery and nephrology Admission status: Telemetry observation   Cort ONEIDA Mana MD [facility] Pager [phone] 08/11/2024, 11:45 AM       [1]  Allergies Allergen Reactions   Betadine [Povidone Iodine ] Rash   "

## 2024-08-11 NOTE — Progress Notes (Signed)
 Pt receives outpt HD at [facility] on TTS at 6:30a. Navigator following to assist with any HD needs.  Suzen Satchel Dialysis Navigator [phone]

## 2024-08-11 NOTE — Progress Notes (Signed)
 Writer spoke with primary nurse. Patient has refused to complete his ordered treatment today. Education has been provided. Care provider made aware.

## 2024-08-11 NOTE — ED Provider Notes (Signed)
 "  [facility] Provider Note    Event Date/Time   First MD Initiated Contact with Patient 08/11/24 [phone]     (approximate)   History   Foot Pain  Patient is Spanish-speaking only and history and physical was obtained with the help of the iPad Spanish interpreter.  HPI  Mario [name] is a 54 y.o. adult  a past medical history of smoking, CHF, ESRD on HD, hypertension who presents with 5 days of left-sided foot pain.  Patient states that pain began suddenly without trauma 5 days ago.  It has persisted and comes in waves.  He rates the pain as 10 out of 10 currently.  Denies any known wounds to the foot but tells me that it has become increasingly swollen and discolored.  He reports that he has not experienced similar pain before and this is different from how he presented earlier this year (as that was from a trip and fall).  Reports that he last received his dialysis 48 hours ago.  He does not make any urine at baseline  He also tells me that he has a wound on his left hand.  This has been present for several weeks.  He tells me that he was seen at a different facility for wires coming out of his hand.  Denies any substance use denies any SI HI or AVH S.  He states that he does have a house that he lives in and is not unhoused.     Physical Exam   Triage Vital Signs: ED Triage Vitals  Encounter Vitals Group     BP 08/11/24 0642 (!) 164/73     Girls Systolic BP Percentile --      Girls Diastolic BP Percentile --      Boys Systolic BP Percentile --      Boys Diastolic BP Percentile --      Pulse Rate 08/11/24 0642 66     Resp 08/11/24 0642 18     Temp 08/11/24 0642 98.3 F (36.8 C)     Temp Source 08/11/24 0642 Oral     SpO2 08/11/24 0638 100 %     Weight 08/11/24 0648 165 lb (74.8 kg)     Height 08/11/24 0648 5' 2.99 (1.6 m)     Head Circumference --      Peak Flow --      Pain Score 08/11/24 0648 10     Pain Loc --      Pain Education --       Exclude from Growth Chart --     Most recent vital signs: Vitals:   08/11/24 1051 08/11/24 1313  BP:  (!) 193/80  Pulse:  67  Resp:  20  Temp: 98.1 F (36.7 C) 97.6 F (36.4 C)  SpO2:  100%    Nursing Triage Note reviewed. Vital signs reviewed and patients oxygen saturation is normoxic  General: Patient is well nourished, well developed, awake and alert,  Very dishelved. Holding left leg, screaming out in pain, appears to be in acute distress Head: Normocephalic and atraumatic Eyes: Normal inspection, extraocular muscles intact, no conjunctival pallor Ear, nose, throat: Normal external exam Neck: Normal range of motion Respiratory: Patient is in no respiratory distress, lungs CTAB Cardiovascular: Patient is not tachycardic, RR GI: Abd SNT with no guarding or rebound  Extremities: Left lower extremity has tenderness throughout his calf but no swelling past the ankle left foot appears swollen and discolored with delayed cap refill  however there was a DP pulse I am unable to palpate a PT pulse or get 1 on Doppler Right foot: <5 sec cap refill, poor toes:   Neuro: The patient is alert and oriented to person, place, and time, appropriately conversive, with 5/5 bilat UE/LE strength, no gross motor or sensory defects noted. Coordination appears to be adequate. Skin:  Psych: themes of persistent delusions, normal mood and affect, no SI or HI  ED Results / Procedures / Treatments   Labs (all labs ordered are listed, but only abnormal results are displayed) Labs Reviewed  CBC WITH DIFFERENTIAL/PLATELET - Abnormal; Notable for the following components:      Result Value   RBC 3.75 (*)    Hemoglobin 12.5 (*)    MCV 104.0 (*)    Platelets 105 (*)    Lymphs Abs 0.6 (*)    All other components within normal limits  COMPREHENSIVE METABOLIC PANEL WITH GFR - Abnormal; Notable for the following components:   Chloride 94 (*)    BUN 40 (*)    Creatinine, Ser 7.12 (*)    Alkaline  Phosphatase 188 (*)    GFR, Estimated 8 (*)    Anion gap 16 (*)    All other components within normal limits  APTT - Abnormal; Notable for the following components:   aPTT 39 (*)    All other components within normal limits  C-REACTIVE PROTEIN - Abnormal; Notable for the following components:   CRP 3.4 (*)    All other components within normal limits  CULTURE, BLOOD (ROUTINE X 2)  CULTURE, BLOOD (ROUTINE X 2)  MRSA NEXT GEN BY PCR, NASAL  ETHANOL  SEDIMENTATION RATE  LACTIC ACID, PLASMA  LIPID PANEL  URINE DRUG SCREEN  URIC ACID  HIV ANTIBODY (ROUTINE TESTING W REFLEX)     EKG EKG and rhythm strip are interpreted by myself:   EKG: [Normal sinus rhythm] at heart rate of 68, normal QRS duration, QTc 501, nonspecific ST segments and T waves no ectopy EKG not consistent with Acute STEMI Rhythm strip: NSR in lead II   RADIOLOGY Xray foot: No fracture on my independent review and interpretation radiologist reads this as multiple calcifications that are vascular CT angio aorta with runoff: Consistent with vascular calcifications but no acute stenosis or occlusion    PROCEDURES:  Critical Care performed: No  Procedures   MEDICATIONS ORDERED IN ED: Medications  LORazepam  (ATIVAN ) injection 0.5 mg (has no administration in time range)  amLODipine  (NORVASC ) tablet 5 mg (0 mg Oral Hold 08/11/24 1330)  prednisoLONE  acetate (PRED FORTE ) 1 % ophthalmic suspension 1-2 drop (has no administration in time range)  heparin  injection 5,000 Units (5,000 Units Subcutaneous Given 08/11/24 1330)  acetaminophen  (TYLENOL ) tablet 650 mg (has no administration in time range)    Or  acetaminophen  (TYLENOL ) suppository 650 mg (has no administration in time range)  HYDROmorphone  (DILAUDID ) injection 0.5-1 mg (1 mg Intravenous Given 08/11/24 1329)  traZODone  (DESYREL ) tablet 25 mg (has no administration in time range)  senna-docusate (Senokot-S) tablet 1 tablet (has no administration in time range)   ondansetron  (ZOFRAN ) tablet 4 mg (has no administration in time range)    Or  ondansetron  (ZOFRAN ) injection 4 mg (has no administration in time range)  Chlorhexidine  Gluconate Cloth 2 % PADS 6 each (has no administration in time range)  feeding supplement (NEPRO CARB STEADY) liquid 237 mL (237 mLs Oral Given 08/11/24 1331)  ondansetron  (ZOFRAN ) injection 4 mg (4 mg Intravenous Given 08/11/24 0734)  morphine  (PF) 4 MG/ML injection 4 mg (4 mg Intravenous Given 08/11/24 0734)  cefTRIAXone  (ROCEPHIN ) 1 g in sodium chloride  0.9 % 100 mL IVPB (0 g Intravenous Stopped 08/11/24 0831)  iohexol  (OMNIPAQUE ) 350 MG/ML injection 100 mL (100 mLs Intravenous Contrast Given 08/11/24 0948)  morphine  (PF) 4 MG/ML injection 4 mg (4 mg Intravenous Given 08/11/24 1050)     IMPRESSION / MDM / ASSESSMENT AND PLAN / ED COURSE                                Differential diagnosis includes, but is not limited to, ischemic foot, osteomyelitis, neuropathy, claudication, cellulitis,  ED course: Patient presents with what appears to me to be an ischemic foot however he does have a DP pulse but he is acting as if this is claudication based on history and exam.  I did review the indications benefits alternatives of obtaining CT imaging with the patient given that he is a dialysis patient but reassured that he does not make urine at baseline.  And he is due for dialysis.  He voiced understanding and opted to proceed and this has been ordered.  I did order inflammatory markers for workup for possible osteomyelitis as this will be needed if CT angio is unrevealing.  Will also obtain Doppler ultrasound.  Patient does have what appears to be an infected wound on his left hand consistent with cellulitis without abscess.  Will initiate ceftriaxone  at this time blood cultures have already been taken.  Anticipate patient will likely need admission today.  Have administered pain medications with good effect.  He will need dialysis this  admission   Clinical Course as of 08/11/24 1549  Thu Aug 11, 2024  0807 DG Foot 2 Views Left Extensive vascular calcifications and foot [HD]  1010 Patient reassessed, [HD]  1030 Will page vascular surgery [HD]  1100 Case discussed with hospitalist for admission [HD]    Clinical Course User Index [HD] Nicholaus Rolland BRAVO, MD   -- Risk: 5 This patient has a high risk of morbidity due to further diagnostic testing or treatment. Rationale: This patients evaluation and management involve a high risk of morbidity due to the potential severity of presenting symptoms, need for diagnostic testing, and/or initiation of treatment that may require close monitoring. The differential includes conditions with potential for significant deterioration or requiring escalation of care. Treatment decisions in the ED, including medication administration, procedural interventions, or disposition planning, reflect this level of risk. COPA: 5 The patient has the following acute or chronic illness/injury that poses a possible threat to life or bodily function: [X] : The patient has a potentially serious acute condition or an acute exacerbation of a chronic illness requiring urgent evaluation and management in the Emergency Department. The clinical presentation necessitates immediate consideration of life-threatening or function-threatening diagnoses, even if they are ultimately ruled out.   FINAL CLINICAL IMPRESSION(S) / ED DIAGNOSES   Final diagnoses:  Claudication  Left foot pain  Cellulitis of dorsum of hand  ESRD on dialysis [facility])     Rx / DC Orders   ED Discharge Orders     None        Note:  This document was prepared using Dragon voice recognition software and may include unintentional dictation errors.   Nicholaus Rolland BRAVO, MD 08/11/24 1549  "

## 2024-08-11 NOTE — Progress Notes (Signed)
 Have attempted to see the patient 3 times today.  Twice he was not in his room.  The third time the interpreter on the computer would not work.  We are planning an angiogram tomorrow and I will try back seeing him tomorrow morning.  I have placed n.p.o. after midnight orders.

## 2024-08-11 NOTE — Progress Notes (Signed)
 Patient refused dialysis because he is in a lot of pain and will go tomorrow when he feels better.   Dialysis nurse notified.

## 2024-08-12 ENCOUNTER — Encounter
Admission: EM | Disposition: A | Discharge status: Home / Self Care | Payer: Self-pay | Source: Ambulatory Visit | Attending: Student

## 2024-08-12 ENCOUNTER — Encounter (HOSPITAL_COMMUNITY): Payer: Self-pay

## 2024-08-12 ENCOUNTER — Inpatient Hospital Stay: Admit: 2024-08-12 | Payer: Self-pay | Admitting: Family Medicine

## 2024-08-12 ENCOUNTER — Encounter: Payer: Self-pay | Admitting: Vascular Surgery

## 2024-08-12 DIAGNOSIS — I70203 Unspecified atherosclerosis of native arteries of extremities, bilateral legs: Secondary | ICD-10-CM

## 2024-08-12 DIAGNOSIS — M79673 Pain in unspecified foot: Secondary | ICD-10-CM

## 2024-08-12 DIAGNOSIS — I120 Hypertensive chronic kidney disease with stage 5 chronic kidney disease or end stage renal disease: Secondary | ICD-10-CM

## 2024-08-12 DIAGNOSIS — R230 Cyanosis: Secondary | ICD-10-CM

## 2024-08-12 DIAGNOSIS — M86642 Other chronic osteomyelitis, left hand: Secondary | ICD-10-CM

## 2024-08-12 DIAGNOSIS — I998 Other disorder of circulatory system: Secondary | ICD-10-CM

## 2024-08-12 HISTORY — PX: LOWER EXTREMITY ANGIOGRAPHY: CATH118251

## 2024-08-12 LAB — CBC
HCT: 36.6 % — ABNORMAL LOW (ref 39.0–52.0)
Hemoglobin: 11.9 g/dL — ABNORMAL LOW (ref 13.0–17.0)
MCH: 33.6 pg (ref 26.0–34.0)
MCHC: 32.5 g/dL (ref 30.0–36.0)
MCV: 103.4 fL — ABNORMAL HIGH (ref 80.0–100.0)
Platelets: 102 K/uL — ABNORMAL LOW (ref 150–400)
RBC: 3.54 MIL/uL — ABNORMAL LOW (ref 4.22–5.81)
RDW: 15.1 % (ref 11.5–15.5)
WBC: 4.7 K/uL (ref 4.0–10.5)
nRBC: 0 % (ref 0.0–0.2)

## 2024-08-12 LAB — BASIC METABOLIC PANEL WITH GFR
Anion gap: 16 — ABNORMAL HIGH (ref 5–15)
BUN: 46 mg/dL — ABNORMAL HIGH (ref 6–20)
CO2: 22 mmol/L (ref 22–32)
Calcium: 9.7 mg/dL (ref 8.9–10.3)
Chloride: 96 mmol/L — ABNORMAL LOW (ref 98–111)
Creatinine, Ser: 8.16 mg/dL — ABNORMAL HIGH (ref 0.61–1.24)
GFR, Estimated: 7 mL/min — ABNORMAL LOW
Glucose, Bld: 72 mg/dL (ref 70–99)
Potassium: 5.4 mmol/L — ABNORMAL HIGH (ref 3.5–5.1)
Sodium: 134 mmol/L — ABNORMAL LOW (ref 135–145)

## 2024-08-12 LAB — HIV ANTIBODY (ROUTINE TESTING W REFLEX): HIV Screen 4th Generation wRfx: NONREACTIVE

## 2024-08-12 LAB — HEPATITIS B SURFACE ANTIGEN: Hepatitis B Surface Ag: NONREACTIVE

## 2024-08-12 MED ORDER — CEFAZOLIN SODIUM-DEXTROSE 1-4 GM/50ML-% IV SOLN
1.0000 g | Freq: Once | INTRAVENOUS | Status: AC
  Administered 2024-08-12: 1 g via INTRAVENOUS
  Filled 2024-08-12: qty 50

## 2024-08-12 MED ORDER — DIPHENHYDRAMINE HCL 50 MG/ML IJ SOLN
INTRAMUSCULAR | Status: DC | PRN
  Administered 2024-08-12: 25 mg via INTRAVENOUS

## 2024-08-12 MED ORDER — IODIXANOL 320 MG/ML IV SOLN
INTRAVENOUS | Status: DC | PRN
  Administered 2024-08-12: 50 mL

## 2024-08-12 MED ORDER — CEFAZOLIN SODIUM-DEXTROSE 1-4 GM/50ML-% IV SOLN
INTRAVENOUS | Status: AC
  Filled 2024-08-12: qty 50

## 2024-08-12 MED ORDER — DIPHENHYDRAMINE HCL 50 MG/ML IJ SOLN
INTRAMUSCULAR | Status: AC
  Filled 2024-08-12: qty 1

## 2024-08-12 MED ORDER — HEPARIN (PORCINE) IN NACL 1000-0.9 UT/500ML-% IV SOLN
INTRAVENOUS | Status: DC | PRN
  Administered 2024-08-12: 1000 mL

## 2024-08-12 MED ORDER — MIDAZOLAM HCL 5 MG/5ML IJ SOLN
INTRAMUSCULAR | Status: AC
  Filled 2024-08-12: qty 5

## 2024-08-12 MED ORDER — HEPARIN SODIUM (PORCINE) 1000 UNIT/ML IJ SOLN
INTRAMUSCULAR | Status: AC
  Filled 2024-08-12: qty 10

## 2024-08-12 MED ORDER — ASPIRIN 81 MG PO TBEC
81.0000 mg | DELAYED_RELEASE_TABLET | Freq: Every day | ORAL | Status: DC
  Administered 2024-08-12 – 2024-08-17 (×6): 81 mg via ORAL
  Filled 2024-08-12 (×6): qty 1

## 2024-08-12 MED ORDER — PENTAFLUOROPROP-TETRAFLUOROETH EX AERO
INHALATION_SPRAY | CUTANEOUS | Status: AC
  Filled 2024-08-12: qty 30

## 2024-08-12 MED ORDER — FENTANYL CITRATE (PF) 100 MCG/2ML IJ SOLN
INTRAMUSCULAR | Status: DC | PRN
  Administered 2024-08-12 (×3): 25 ug via INTRAVENOUS

## 2024-08-12 MED ORDER — MIDAZOLAM HCL (PF) 2 MG/2ML IJ SOLN
INTRAMUSCULAR | Status: DC | PRN
  Administered 2024-08-12 (×2): 1 mg via INTRAVENOUS

## 2024-08-12 MED ORDER — TRIAMCINOLONE 0.1 % CREAM:EUCERIN CREAM 1:1: TOPICAL_CREAM | Freq: Three times a day (TID) | CUTANEOUS | Status: DC | PRN

## 2024-08-12 MED ORDER — SODIUM CHLORIDE 0.9 % IV SOLN: INTRAVENOUS | Status: DC

## 2024-08-12 MED ORDER — CLOPIDOGREL BISULFATE 75 MG PO TABS
75.0000 mg | ORAL_TABLET | Freq: Every day | ORAL | Status: DC
  Administered 2024-08-12 – 2024-08-17 (×6): 75 mg via ORAL
  Filled 2024-08-12 (×6): qty 1

## 2024-08-12 MED ORDER — SODIUM ZIRCONIUM CYCLOSILICATE 5 G PO PACK
5.0000 g | PACK | Freq: Once | ORAL | Status: DC
  Filled 2024-08-12: qty 1

## 2024-08-12 MED ORDER — HYDROMORPHONE HCL 1 MG/ML IJ SOLN
INTRAMUSCULAR | Status: AC
  Filled 2024-08-12: qty 0.5

## 2024-08-12 MED ORDER — ATORVASTATIN CALCIUM 10 MG PO TABS
10.0000 mg | ORAL_TABLET | Freq: Every day | ORAL | Status: DC
  Administered 2024-08-12 – 2024-08-17 (×6): 10 mg via ORAL
  Filled 2024-08-12 (×7): qty 1

## 2024-08-12 MED ORDER — TRIAMCINOLONE ACETONIDE 0.1 % EX CREA
TOPICAL_CREAM | Freq: Three times a day (TID) | CUTANEOUS | Status: DC | PRN
  Administered 2024-08-13: 1 via TOPICAL
  Filled 2024-08-12: qty 15

## 2024-08-12 MED ORDER — HEPARIN SODIUM (PORCINE) 1000 UNIT/ML IJ SOLN
INTRAMUSCULAR | Status: DC | PRN
  Administered 2024-08-12: 4000 [IU] via INTRAVENOUS

## 2024-08-12 MED ORDER — DIPHENHYDRAMINE HCL 25 MG PO CAPS
25.0000 mg | ORAL_CAPSULE | Freq: Four times a day (QID) | ORAL | Status: DC | PRN
  Administered 2024-08-12: 25 mg via ORAL
  Filled 2024-08-12: qty 1

## 2024-08-12 MED ORDER — LIDOCAINE-EPINEPHRINE (PF) 1 %-1:200000 IJ SOLN
INTRAMUSCULAR | Status: DC | PRN
  Administered 2024-08-12: 10 mL

## 2024-08-12 MED ORDER — FENTANYL CITRATE (PF) 100 MCG/2ML IJ SOLN
INTRAMUSCULAR | Status: AC
  Filled 2024-08-12: qty 2

## 2024-08-12 MED ORDER — NICOTINE 14 MG/24HR TD PT24
14.0000 mg | MEDICATED_PATCH | Freq: Every day | TRANSDERMAL | Status: DC
  Administered 2024-08-13 – 2024-08-17 (×5): 14 mg via TRANSDERMAL
  Filled 2024-08-12 (×6): qty 1

## 2024-08-12 MED ORDER — ACETAMINOPHEN 500 MG PO TABS
1000.0000 mg | ORAL_TABLET | Freq: Once | ORAL | Status: AC
  Administered 2024-08-12: 1000 mg via ORAL
  Filled 2024-08-12: qty 2

## 2024-08-12 NOTE — Progress Notes (Signed)
 " Progress Note   Patient: Mario [name] FMW:[id number] DOB: 05-Jun-1971 DOA: 08/11/2024     0 DOS: the patient was seen and examined on 08/12/2024   Brief hospital course: Per H&P HPI  Mario [name] is a 54 y.o. adult with medical history significant of ESRD and HD TTS, HTN HLD, presented with worsening of left foot pain and nonhealing wound of left hand.   Patient complains about having left foot pain for 7+ days, quality getting worse, last 2 days even putting weight on left foot can trigger significant pain.  He pointed the pain mainly to the 5 toes especially the 1st and 5th toes, he has a small ulcer on lateral side of the pinky toe.  Denies any history of claudications.  Meantime he also complained about a unhealing wound of left hand, located on the extension side of the MCP joint, this has been a more chronic issue and he reported that he has not been able to bend left middle finger for several months.  Denied any fever or chills.  Last dialysis was last Tuesday.   ED Course: Afebrile, nontachycardic blood pressure 164/73 O2 saturation 100% on room air.  CTA of abdomen pelvis bifurcation lower extremity showed no significant occlusion or stenosis of major vessels but heavy calcification of medium and small vessels.  Blood work showed WBC 5.3 hemoglobin 12.5 BUN 40 creatinine 7.1 K4.5 bicarb 25.   Vascular surgery consulted.  Assessment and Plan: Rest ischemic pain  Continue DAPT. Further recommendations per [facility] after angiogram. Per podiatry no surgical needs from there standpoint at this time. Continue statin.  Pain control   ESRD on HD TThS Continue HD per nephrology    L hand non healing ulcer Likely chronic osteo/septic arthritis at MCP of L 3rd digit. Will require I&D. Hand surgery team consulted will follow up recommendations.   Hyper K  S/p HD   HTN Continue amlodipine    HLD Continue statin   Macrocytic anemia  Check iron  panel, b12,  folate  Thrombocytopenia Appears chronic   Tobacco use disorder Will discuss cessation Nicotine  replacement therapy    {Tip this will not be part of the note when signed Body mass index is 26.09 kg/m. , ,  (Optional):26781}  Subjective: Seen after angiogram. Having some pain in L lateral aspect of his L foot.   Physical Exam: Vitals:   08/12/24 1635 08/12/24 1645 08/12/24 1700 08/12/24 1717  BP: 138/73 (!) 152/69 125/73 (!) 145/79  Pulse: 64 (!) 57 60 62  Resp: 13 13 11 16   Temp: 97.9 F (36.6 C)  97.9 F (36.6 C) 98.4 F (36.9 C)  TempSrc: Temporal  Temporal   SpO2: 95% 94% 93% 100%  Weight:      Height:       Physical Exam  Constitutional: In no distress. Disheveled.  Cardiovascular: Normal rate, regular rhythm. No lower extremity edema  Pulmonary: Non labored breathing on room air, no wheezing or rales.   Abdominal: Soft. Mildly distended and non tender Musculoskeletal: Normal range of motion.     Neurological: Alert and oriented to person, place, and time. Non focal  Skin: L foot warm, bandage on L lateral aspect.   Data Reviewed: {Tip this will not be part of the note when signed- Document your independent interpretation of telemetry tracing, EKG, lab, Radiology test or any other diagnostic tests. Add any new diagnostic test ordered today. (Optional):26781     Latest Ref Rng & Units 08/12/2024  6:00 AM 08/11/2024    7:26 AM 10/13/2023    6:54 PM  BMP  Glucose 70 - 99 mg/dL 72  73  870   BUN 6 - 20 mg/dL 46  40  31   Creatinine 0.61 - 1.24 mg/dL 1.83  2.87  4.48   Sodium 135 - 145 mmol/L 134  135  133   Potassium 3.5 - 5.1 mmol/L 5.4  4.5  4.7   Chloride 98 - 111 mmol/L 96  94  95   CO2 22 - 32 mmol/L 22  25  23    Calcium  8.9 - 10.3 mg/dL 9.7  9.8  9.0       Latest Ref Rng & Units 08/12/2024   11:00 AM 08/11/2024    7:26 AM 10/13/2023    6:54 PM  CBC  WBC 4.0 - 10.5 K/uL 4.7  5.3  9.3   Hemoglobin 13.0 - 17.0 g/dL 88.0  87.4  88.8   Hematocrit 39.0 - 52.0  % 36.6  39.0  34.0   Platelets 150 - 400 K/uL 102  105  98      Family Communication: None available in the [place]   Disposition: Status is: Inpatient Remains inpatient appropriate because: management of foot pain   Planned Discharge Destination: Pending clinical course  {Tip this will not be part of the note when signed  DVT Prophylaxis  ., Heparin  injection 5,000 units  (Optional):26781}   Time spent: 35 minutes  Author: Alban Pepper, MD 08/12/2024 5:29 PM  For on call review [email or web address].  "

## 2024-08-12 NOTE — Consult Note (Addendum)
 PODIATRY / FOOT AND ANKLE SURGERY CONSULTATION NOTE  Requesting Physician: Dr. Laurita  Reason for consult: Foot pain bilateral, left greater than right, ulceration left foot   HPI: Mario [name] is a 54 y.o. adult who presents with left foot wound to the fifth metatarsal phalange joint laterally with stable eschar present.  Patient went to the emergency room on 08/11/2024 due to complaints of left foot pain for about a week.  He notes pain fairly globally to the left foot.  He also concerns for nonhealing wound to his left hand in which orthopedics has been consulted for.  MRI was performed of the hand and foot showing osteomyelitis of the both potentially.  Podiatry was consulted for further evaluation of left foot.  Patient has end-stage renal disease on dialysis and significant peripheral vascular disease.  Vascular surgery planning angiogram today with intervention if able.  Patient seen today with translator services.  PMHx:  Past Medical History:  Diagnosis Date   Abdominal pain 08/28/2016   Acute hyperkalemia 10/28/2016   Chest pain 04/11/2016   Chronic combined systolic and diastolic CHF (congestive heart failure) (HCC)    Cirrhosis (HCC)    Dialysis patient    Edentulous    ESRD (end stage renal disease) on dialysis (HCC) 01/25/2015   Fluid overload 04/12/2016   HTN (hypertension) 10/13/2016   Hyperkalemia 11/27/2016   Hypertension    Mitral regurgitation    Pulmonary edema 07/03/2016   Renal disorder    Renal insufficiency    Scrotal infection 01/25/2015    Surgical Hx:  Past Surgical History:  Procedure Laterality Date   ANTERIOR CERVICAL CORPECTOMY N/A 04/05/2019   Procedure: ANTERIOR CERVICAL CORPECTOMY Cervical six and Cervical seven with strut graft fusion from Cervical five-Thoracic one   ;  Surgeon: Louis Shove, MD;  Location: [facility] OR;  Service: Neurosurgery;  Laterality: N/A;   AV FISTULA PLACEMENT     POSTERIOR CERVICAL FUSION/FORAMINOTOMY N/A 04/05/2019    Procedure: Posterior cervical fusion utilizing segmental instrumentation from Cervical five-Thoracic one ;  Surgeon: Louis Shove, MD;  Location: [facility] OR;  Service: Neurosurgery;  Laterality: N/A;    FHx:  Family History  Problem Relation Age of Onset   Kidney failure Father    Diabetes Mother     Social History:  reports that she has been smoking cigarettes. She has a 6.3 pack-year smoking history. She has never used smokeless tobacco. She reports that she does not drink alcohol and does not use drugs.  Allergies: Allergies[1]   Medications Prior to Admission  Medication Sig Dispense Refill   amLODipine  (NORVASC ) 5 MG tablet Take 1 tablet (5 mg total) by mouth daily. 30 tablet 2   amoxicillin -clavulanate (AUGMENTIN ) 875-125 MG tablet Take 1 tablet by mouth 2 (two) times daily. (Patient not taking: Reported on 08/11/2024) 14 tablet 0   ferric citrate  (AURYXIA ) 1 GM 210 MG(Fe) tablet Take 420 mg by mouth 3 (three) times daily with meals. (Patient not taking: Reported on 08/11/2024)     prednisoLONE  acetate (PRED FORTE ) 1 % ophthalmic suspension Place 1-2 drops into the right eye every 4 (four) hours as needed. (Patient not taking: Reported on 08/11/2024)      Physical Exam: General: Alert and oriented.  No apparent distress.  Vascular: DP/PT pulse right side +1, left side nonpalpable, capillary fill time appears to be intact to digits 1 through 5 bilateral, no hair growth present to bilateral lower extremities.  Moderate edema present to bilateral lower extremities worse on the  left side than the right.  Left side does also appear to have some mild erythema present in general from the forefoot extending up to the knee that is improved with elevation.  Neuro: Light touch sensation reduced to bilateral lower extremities.  Derm: Stable eschar present to the lateral aspect of the left fifth metatarsal phalangeal joint area with no increase in erythema or edema specifically to this area.  No obvious  bone exposed at this time and appears to be fairly superficial although depth unknown.  Overall once again the left foot ankle and lower leg appears to be edematous with some mild erythema appearing more so related to ischemic changes.      MSK: Pain on palpation left foot in general  Results for orders placed or performed during the hospital encounter of 08/11/24 (from the past 48 hours)  Blood culture (routine x 2)     Status: None (Preliminary result)   Collection Time: 08/11/24  7:15 AM   Specimen: BLOOD  Result Value Ref Range   Specimen Description BLOOD RIGHT ANTECUBITAL    Special Requests      BOTTLES DRAWN AEROBIC AND ANAEROBIC Blood Culture adequate volume   Culture      NO GROWTH < 24 HOURS Performed at [facility], [address] [place], [place] [address]    Report Status PENDING   CBC with Differential     Status: Abnormal   Collection Time: 08/11/24  7:26 AM  Result Value Ref Range   WBC 5.3 4.0 - 10.5 K/uL   RBC 3.75 (L) 4.22 - 5.81 MIL/uL   Hemoglobin 12.5 (L) 13.0 - 17.0 g/dL   HCT 60.9 60.9 - 47.9 %   MCV 104.0 (H) 80.0 - 100.0 fL   MCH 33.3 26.0 - 34.0 pg   MCHC 32.1 30.0 - 36.0 g/dL   RDW 84.7 88.4 - 84.4 %   Platelets 105 (L) 150 - 400 K/uL   nRBC 0.0 0.0 - 0.2 %   Neutrophils Relative % 73 %   Neutro Abs 3.8 1.7 - 7.7 K/uL   Lymphocytes Relative 11 %   Lymphs Abs 0.6 (L) 0.7 - 4.0 K/uL   Monocytes Relative 10 %   Monocytes Absolute 0.6 0.1 - 1.0 K/uL   Eosinophils Relative 5 %   Eosinophils Absolute 0.3 0.0 - 0.5 K/uL   Basophils Relative 1 %   Basophils Absolute 0.1 0.0 - 0.1 K/uL   Immature Granulocytes 0 %   Abs Immature Granulocytes 0.02 0.00 - 0.07 K/uL    Comment: Performed at [facility], [address] [place], [place] [address]  Comprehensive metabolic panel     Status: Abnormal   Collection Time: 08/11/24  7:26 AM  Result Value Ref Range   Sodium 135 135 - 145 mmol/L   Potassium 4.5 3.5 - 5.1 mmol/L    Chloride 94 (L) 98 - 111 mmol/L   CO2 25 22 - 32 mmol/L   Glucose, Bld 73 70 - 99 mg/dL    Comment: Glucose reference range applies only to samples taken after fasting for at least 8 hours.   BUN 40 (H) 6 - 20 mg/dL   Creatinine, Ser 2.87 (H) 0.61 - 1.24 mg/dL   Calcium  9.8 8.9 - 10.3 mg/dL   Total Protein 7.6 6.5 - 8.1 g/dL   Albumin 4.0 3.5 - 5.0 g/dL   AST 36 15 - 41 U/L   ALT 13 0 - 44 U/L   Alkaline Phosphatase 188 (  H) 38 - 126 U/L   Total Bilirubin 0.8 0.0 - 1.2 mg/dL   GFR, Estimated 8 (L) >60 mL/min    Comment: (NOTE) Calculated using the CKD-EPI Creatinine Equation (2021)    Anion gap 16 (H) 5 - 15    Comment: Performed at [facility], [address] [place], [place] [address]  Ethanol     Status: None   Collection Time: 08/11/24  7:26 AM  Result Value Ref Range   Alcohol, Ethyl (B) <15 <15 mg/dL    Comment: (NOTE) For medical purposes only. Performed at [facility], [address] [place], [place] [address]   APTT     Status: Abnormal   Collection Time: 08/11/24  7:26 AM  Result Value Ref Range   aPTT 39 (H) 24 - 36 seconds    Comment:        IF BASELINE aPTT IS ELEVATED, SUGGEST PATIENT RISK ASSESSMENT BE USED TO DETERMINE APPROPRIATE ANTICOAGULANT THERAPY. Performed at [facility], [address] [place], [place] [address]   Sedimentation rate     Status: None   Collection Time: 08/11/24  7:26 AM  Result Value Ref Range   Sed Rate 18 0 - 20 mm/hr    Comment: Performed at [facility], [address] [place], [place] [address]  C-reactive protein     Status: Abnormal   Collection Time: 08/11/24  7:26 AM  Result Value Ref Range   CRP 3.4 (H) <1.0 mg/dL    Comment: Performed at [facility], [address], [place], [place] [address]  Lactic acid, plasma     Status: None   Collection Time: 08/11/24  7:26 AM  Result Value Ref Range   Lactic Acid, Venous 0.9 0.5 - 1.9 mmol/L    Comment: Performed at  [facility], [address] [place], [place] [address]  Blood culture (routine x 2)     Status: None (Preliminary result)   Collection Time: 08/11/24  7:26 AM   Specimen: BLOOD  Result Value Ref Range   Specimen Description BLOOD BLOOD RIGHT HAND    Special Requests      BOTTLES DRAWN AEROBIC AND ANAEROBIC Blood Culture results may not be optimal due to an inadequate volume of blood received in culture bottles   Culture      NO GROWTH < 24 HOURS Performed at [facility], [address], [place], [place] [address]    Report Status PENDING   Uric acid     Status: None   Collection Time: 08/11/24  7:26 AM  Result Value Ref Range   Uric Acid, Serum 5.0 3.7 - 8.6 mg/dL    Comment: Performed at [facility], [address] [place], [place] [address]  Lipid panel     Status: None   Collection Time: 08/11/24  7:26 AM  Result Value Ref Range   Cholesterol 116 0 - 200 mg/dL    Comment:        ATP III CLASSIFICATION:  <200     mg/dL   Desirable  799-760  mg/dL   Borderline High  >=759    mg/dL   High           Triglycerides 50 <150 mg/dL   HDL 50 >59 mg/dL   Total CHOL/HDL Ratio 2.3 RATIO   VLDL 10 0 - 40 mg/dL   LDL Cholesterol 56 0 - 99 mg/dL    Comment:  Total Cholesterol/HDL:CHD Risk Coronary Heart Disease Risk Table                     Men   Women  1/2 Average Risk   3.4   3.3  Average Risk       5.0   4.4  2 X Average Risk   9.6   7.1  3 X Average Risk  23.4   11.0        Use the calculated Patient Ratio above and the CHD Risk Table to determine the patient's CHD Risk.        ATP III CLASSIFICATION (LDL):  <100     mg/dL   Optimal  899-870  mg/dL   Near or Above                    Optimal  130-159  mg/dL   Borderline  839-810  mg/dL   High  >809     mg/dL   Very High Performed at [facility], [address] [place], [place] [address]   MRSA Next Gen by PCR, Nasal     Status: None   Collection Time: 08/11/24  4:00 PM    Specimen: Nasal Mucosa; Nasal Swab  Result Value Ref Range   MRSA by PCR Next Gen NOT DETECTED NOT DETECTED    Comment: (NOTE) The GeneXpert MRSA Assay (FDA approved for NASAL specimens only), is one component of a comprehensive MRSA colonization surveillance program. It is not intended to diagnose MRSA infection nor to guide or monitor treatment for MRSA infections. Test performance is not FDA approved in patients less than 73 years old. Performed at [facility], [address] [place], [place] [address]   Basic metabolic panel     Status: Abnormal   Collection Time: 08/12/24  6:00 AM  Result Value Ref Range   Sodium 134 (L) 135 - 145 mmol/L   Potassium 5.4 (H) 3.5 - 5.1 mmol/L   Chloride 96 (L) 98 - 111 mmol/L   CO2 22 22 - 32 mmol/L   Glucose, Bld 72 70 - 99 mg/dL    Comment: Glucose reference range applies only to samples taken after fasting for at least 8 hours.   BUN 46 (H) 6 - 20 mg/dL   Creatinine, Ser 1.83 (H) 0.61 - 1.24 mg/dL   Calcium  9.7 8.9 - 10.3 mg/dL   GFR, Estimated 7 (L) >60 mL/min    Comment: (NOTE) Calculated using the CKD-EPI Creatinine Equation (2021)    Anion gap 16 (H) 5 - 15    Comment: Performed at [facility], [address] [place], [place] [address]   DG Hand Complete Left Result Date: 08/11/2024 EXAM: 3 OR MORE VIEW(S) XRAY OF THE LEFT HAND 08/11/2024 05:18:00 PM COMPARISON: None available. CLINICAL HISTORY: Foreign body (FB) in soft tissue. FINDINGS: BONES AND JOINTS: No acute fracture. Subluxation of the third metacarpophalangeal joint. Joint space narrowing of the third metacarpophalangeal joint, with erosive changes at the third metacarpal head and base of the third proximal phalanx, consistent with sequela of inflammatory or infectious arthropathy. SOFT TISSUES: Diffuse vascular calcifications. Diffuse soft tissue swelling of the third digit. No definitive radiopaque foreign body identified. IMPRESSION: 1. Diffuse soft  tissue swelling of the third digit without a definite radiopaque foreign body. 2. Third metacarpophalangeal joint subluxation with erosive changes, most consistent with inflammatory or infectious arthropathy. 3. Diffuse vascular calcifications. Electronically signed by: Greig Pique MD MD 08/11/2024  08:06 PM EST RP Workstation: [id number]   MR HAND LEFT WO CONTRAST Result Date: 08/11/2024 EXAM: MR LEFT HAND WITHOUT INTRAVENOUS CONTRAST 08/11/2024 03:16:04 PM TECHNIQUE: Multiplanar magnetic resonance images of the left hand without intravenous contrast. COMPARISON: None available. CLINICAL HISTORY: Nonhealing wound along the dorsal middle finger MCP joint, reduced range of motion of the middle finger. FINDINGS: LIMITATIONS/ARTIFACTS: Motion artifact is present, reducing diagnostic sensitivity and specificity. SOFT TISSUES: The dorsal soft tissue ulceration along the middle finger MCP joint region is shown on image 25 series 4. Low grade subcutaneous edema in the interosseous muscle adjacent to the distal middle finger metacarpal. JOINTS: Possible erosion of the dorsal base of the proximal phalanx with anterior subluxation of the proximal phalanx with respect to the metacarpal head. BONES: Underlying reported ulceration along the middle finger MCP joint, there is abnormal marrow edema in the base of the proximal phalanx and in the head of the middle finger metacarpal suspicious for active osteomyelitis. IMPRESSION: 1. Abnormal marrow edema in the base of the proximal phalanx and in the head of the middle finger metacarpal, suspicious for active osteomyelitis, with overlying dorsal ulceration. 2. Erosion of the dorsal base of the proximal phalanx middle finger with anterior subluxation of the proximal phalanx with respect to the metacarpal head. 3. Low grade adjacent interosseous muscle and subcutaneous edema. Adjacent to the distal 3rd digit metacarpal. 4. Motion artifact reduces diagnostic sensitivity and  specificity. Electronically signed by: Ryan Salvage MD MD 08/11/2024 03:47 PM EST RP Workstation: [id number]   MR FOOT LEFT WO CONTRAST Result Date: 08/11/2024 EXAM: MRI of the left Foot without contrast. 08/11/2024 03:00:40 PM TECHNIQUE: Multiplanar multisequence MRI of the left foot was performed without the administration of intravenous contrast. COMPARISON: 08/12/2023. CLINICAL HISTORY: left foot pain for 7 days with clinical suspicion for osteomyelitis. FINDINGS: LIMITATIONS/ARTIFACTS: Motion artifact is present, reducing diagnostic sensitivity and specificity. LISFRANC JOINT: Visualized Lisfranc ligament is intact. No significant Lisfranc interval widening or significant periligamentous edema. BONE MARROW: Small lateral erosion of the head of the 5th metatarsal laterally with adjacent marrow edema for example on image 14 series 8 and image 21 series 5, with thinning of the overlying superficial soft tissues in this vicinity raising the possibility of ulceration. Although the appearance is not classic for osteomyelitis, the presence of the immediately adjacent cutaneous defect in the vicinity of the osseous erosion does raise the possibility of early osteomyelitis in the head of the 5th metatarsal. No acute fracture or aggressive marrow replacing lesion. GREATER AND LESSER MTP JOINTS: Mild degenerative arthropathy at the 1st metatarsophalangeal joint. The 5th metatarsophalangeal joint demonstrates a small lateral erosion of the head of the 5th metatarsal laterally with adjacent marrow edema, as described in the Bone Marrow section. Normal alignment. SOFT TISSUES: Thinning of the overlying superficial soft tissues in the vicinity of the 5th metatarsal head, raising the possibility of ulceration. Trace subcutaneous edema along the dorsum of the forefoot, cellulitis not excluded. TENDONS: Visualized flexor and extensor tendons are intact. IMPRESSION: 1. Small lateral erosion of the head of the 5th  metatarsal with adjacent marrow edema and overlying soft tissue thinning/possible ulceration, raising concern for early osteomyelitis. 2. Trace subcutaneous edema along the dorsum of the forefoot, which can be seen with cellulitis. 3. Mild degenerative arthropathy at the 1st metatarsophalangeal joint. Electronically signed by: Ryan Salvage MD MD 08/11/2024 03:40 PM EST RP Workstation: [id number]   CT Angio Aortobifemoral W and/or Wo Contrast Result Date: 08/11/2024 CLINICAL DATA:  Ischemic left foot. End-stage renal  disease on hemodialysis. EXAM: CT ANGIOGRAPHY OF ABDOMINAL AORTA WITH ILIOFEMORAL RUNOFF TECHNIQUE: Multidetector CT imaging of the abdomen, pelvis and lower extremities was performed using the standard protocol during bolus administration of intravenous contrast. Multiplanar CT image reconstructions and MIPs were obtained to evaluate the vascular anatomy. RADIATION DOSE REDUCTION: This exam was performed according to the departmental dose-optimization program which includes automated exposure control, adjustment of the mA and/or kV according to patient size and/or use of iterative reconstruction technique. CONTRAST:  OMNIPAQUE  IOHEXOL  350 MG/ML SOLN COMPARISON:  Prior CT abdomen/pelvis 02/03/2022 FINDINGS: VASCULAR Aorta: Normal caliber aorta without aneurysm, dissection, vasculitis or significant stenosis. Celiac: Patent without evidence of aneurysm, dissection, vasculitis or significant stenosis. SMA: Patent without evidence of aneurysm, dissection, vasculitis or significant stenosis. Renals: Hypoplastic renal arteries due to longstanding chronic renal failure and renal atrophy. IMA: Moderate to high-grade stenosis of the proximal IMA. The vessel remains patent. RIGHT Lower Extremity Inflow: Common, internal and external iliac arteries are patent without evidence of aneurysm, dissection, vasculitis or significant stenosis. Outflow: Common, superficial and profunda femoral arteries and the  popliteal artery are patent without evidence of aneurysm, dissection, vasculitis or significant stenosis. Runoff: Limited evaluation of the runoff arteries due to extensive medial arteriosclerosis. The circumferential calcium  in the small vessels limits evaluation of the arterial lumen. LEFT Lower Extremity Inflow: Common, internal and external iliac arteries are patent without evidence of aneurysm, dissection, vasculitis or significant stenosis. Outflow: Common, superficial and profunda femoral arteries and the popliteal artery are patent without evidence of aneurysm, dissection, vasculitis or significant stenosis. Runoff: Limited evaluation of the runoff arteries due to extensive medial arteriosclerosis. The circumferential calcium  in the small vessels limits evaluation of the arterial lumen. Veins: Limited evaluation given arterial phase timing of the exam. There is peripheral calcification of the inferior vena cava and bilateral iliac veins which may represent chronic calcinosis, or potentially sequelae of prior ileo caval DVT. Changes are stable over many years. Review of the MIP images confirms the above findings. NON-VASCULAR Lower chest: No acute abnormality. Mild cardiomegaly. Calcifications along the coronary arteries. Large para esophageal varices. Hepatobiliary: Advanced hepatic cirrhosis. No arterially enhancing lesion to suggest the presence of hepatocellular carcinoma. Gallbladder is unremarkable. No intra or extrahepatic biliary ductal dilatation. Pancreas: Unremarkable. No pancreatic ductal dilatation or surrounding inflammatory changes. Spleen: Mild splenomegaly. Adrenals/Urinary Tract: Unremarkable adrenal glands. Atrophied native kidneys. The bladder is decompressed. Stomach/Bowel: No focal bowel wall thickening or evidence of obstruction. Lymphatic: No suspicious lymphadenopathy. Reproductive: Unremarkable prostate gland. Other: Moderate ascites. Musculoskeletal: No acute fracture or aggressive  appearing lytic or blastic osseous lesion. IMPRESSION: VASCULAR 1. No evidence of hemodynamically significant stenosis or occlusive disease in the inflow (aortoiliac) or outflow (femoropopliteal) segments. Evaluation of the runoff (infrageniculate) arteries is limited by severe medial arteriosclerosis. The circumferential calcification of the medium and small artery walls creates streak artifact which severely limits evaluation of the internal lumens. Catheter directed angiography could be considered if clinically warranted. 2. Extensive peripheral calcified atherosclerotic plaque. 3. Extensive peripheral calcification of the inferior vena cava and bilateral iliac vessels, similar compared to prior. 4. No evidence of arterial aneurysm or dissection. NON-VASCULAR 1. Severe hepatic cirrhosis with splenomegaly, esophageal and paraesophageal varices and moderate ascites. 2. No acute abnormality within the abdomen or pelvis. 3. Similar appearance of atrophic native kidneys. Electronically Signed   By: Wilkie Lent M.D.   On: 08/11/2024 10:25   US  Venous Img Lower Unilateral Left Result Date: 08/11/2024 EXAM: ULTRASOUND DUPLEX OF THE LEFT  LOWER EXTREMITY VEINS TECHNIQUE: Duplex ultrasound using B-mode/gray scaled imaging and Doppler spectral analysis and color flow was obtained of the deep venous structures of the left lower extremity. COMPARISON: None available. CLINICAL HISTORY: 54 year old male with lower extremity pain and color changes. FINDINGS: The common femoral vein, femoral vein, popliteal vein, and visible calf veins demonstrate normal compressibility with normal color flow and spectral analysis. Evidence of calcified peripheral vascular disease. The right contralateral common femoral vein appears patent and unremarkable. IMPRESSION: 1. No evidence of left lower extremity DVT. 2. Suspect  calcified peripheral vascular disease in the left lower extremity. Electronically signed by: Helayne Hurst MD MD  08/11/2024 09:35 AM EST RP Workstation: [id number]   DG Foot 2 Views Left Result Date: 08/11/2024 EXAM: 2 VIEW(S) XRAY OF THE LEFT FOOT 08/11/2024 07:32:00 AM COMPARISON: CT left foot 01/22/2018. CLINICAL HISTORY: pain pain FINDINGS: BONES AND JOINTS: Osteopenia. There is a chronic healed fracture deformity of the neck of the 5th metatarsal. No acute fracture. No findings of acute osteomyelitis. No malalignment. Arthritic changes are not seen. SOFT TISSUES: There are extensive vascular calcifications of the distal foreleg and foot. Mild generalized edema. There is mild fusiform thickening in the distal achilles tendon. There is no visible soft tissue gas or foreign body. IMPRESSION: 1. No acute osseous abnormality. 2. Extensive vascular calcifications in the distal foreleg and foot. 3. Soft tissue swelling. 4. Osteopenia. Electronically signed by: Francis Quam MD 08/11/2024 07:40 AM EST RP Workstation: [id number]    Blood pressure (!) 140/71, pulse 62, temperature 97.7 F (36.5 C), resp. rate 18, height 5' 2.99 (1.6 m), weight 74.8 kg, SpO2 100%.  Assessment PVD with ischemic changes left foot Stable eschar present left lateral fifth metatarsal phalangeal joint Possible osteomyelitis left fifth metatarsal phalangeal joint End-stage renal disease on dialysis  Plan - Patient seen and examined - X-ray imaging reviewed.  No apparent evidence for osteomyelitis on x-ray imaging.  MRI reviewed.  Appears show some mild marrow edema present to the lateral aspect of the fifth metatarsal phalangeal joint area at the fifth metatarsal head where there is a bony exostosis present to the area.  Possibly osteomyelitis versus chronic inflammatory change.  ESR and CRP were within normal limits, white blood cell count within normal limits. - As far as infection goes left foot appears to be very stable at this time.  Wound culture taken from the eschar area but would expected to grow normal skin bacteria.  Consider  antibiotic resume in the future if worsening what appears to be very stable. - Recommend Betadine paint to the area daily.  Orders placed for this to be performed.  Postop shoe ordered for left foot.  Also recommend applying a small padded Mepilex border in which he should wear if he is walking around to avoid pressure to the area.  Should allow this area to air dry though for the most part. - No surgery indicated for the left foot at this time besides improvement in circulation.  Patient is going for CT angiogram with intervention today to the left lower extremity.  Patient appears to have heavily calcified arteries on x-ray and MRI imaging.  Appreciate vascular recommendations. - Dr. Ezra with orthopedics was in the room at the same time examining his left hand.  Was talk of possible surgical intervention for the left hand versus antibiotic therapy.  If going on antibiotic therapy  Will monitor patient more peripherally at this time.  May require partial fifth ray amputation in the future  but again appears to be very stable also recommend conservative care.  Can f/u in outpatient clinic in 1-2 weeks after discharge for routine foot care and recheck of eschar.  Prentice Lee, DPM 08/12/2024, 8:20 AM         [1]  Allergies Allergen Reactions   Betadine [Povidone Iodine ] Rash   Povidone-Iodine  Dermatitis

## 2024-08-12 NOTE — Progress Notes (Signed)
 Communicated with pt using interpreter # [phone] [facility]

## 2024-08-12 NOTE — Progress Notes (Signed)
 Pt's hgb was 8.4 prior to today (08/12/2024).  Today, pt's hgb is 8.1, please monitor trend.

## 2024-08-12 NOTE — Hospital Course (Addendum)
 Mario [name] is a 55 y.o.M with medical history significant of ESRD and HD TTS, hepatic cirrhosis (MELD Na 25), HTN HLD, presented with worsening  left foot pain and nonhealing wound of left hand.    He underwent angiogram with vascular surgery S/P Angioplasty- TP trunk, AT and PT. Podiatry evaluated him and felt he need continued wound care. We are currently working on pain control for the foot.    He also noted a non healing ulcer on the dorsum of the L hand. He underwent MRI of the L hand and was noted to have osteomyelitis of the 3rd L MCP head. The hand surgeon here evaluated him but felt his surgery was too complex and that the patient may ultimately require plastics which we don't have. We tried to transfer him to tertiary care but this was declined. I having been holding antibiotics because he was stable and to increase yield of OR cultures.   It may be some time before he is able to connect with a hand surgeon if at all and I wanted to see what recommendations you have regarding antibiotics for him.

## 2024-08-12 NOTE — Progress Notes (Signed)
 Hemodialysis Note:  Received patient in bed to unit. Alert and oriented. Informed consent singed and in chart.  Treatment initiated: 1100 Treatment completed: 1335  Access used: Left AVF Access issues: None  Patient requested to end his treatment 30 minutes early. HD NP made aware.Transported to specials, alert without acute distress. Report given to patient's RN.  Total UF removed: 200 ml Medications given: Tylenol  1000 mg Tablet  Post HD weight: 66.8 Kg  [facility]

## 2024-08-12 NOTE — Plan of Care (Signed)

## 2024-08-12 NOTE — Op Note (Signed)
 [facility]  Percutaneous Study/Intervention Procedural Note   Date of Surgery: 08/12/2024  Surgeon(s):Remy Voiles    Assistants:none  Pre-operative Diagnosis: PAD with concern for rest pain left lower extremity  Post-operative diagnosis:  Same  Procedure(s) Performed:             1.  Ultrasound guidance for vascular access right femoral artery             2.  Catheter placement into left anterior tibial artery and posterior tibial artery from right femoral approach             3.  Aortogram and selective left lower extremity angiogram             4.  Percutaneous transluminal angioplasty infra malleolar portion of the anterior tibial artery/dorsalis pedis artery in the foot and ankle for occlusion with 2 mm diameter by 10 cm length angioplasty balloon             5.  Percutaneous transluminal angioplasty of left TP trunk and proximal posterior tibial artery with 3.5 mm diameter by 4 cm length angioplasty balloon  6.  StarClose closure device right femoral artery  EBL: 5 cc  Contrast: 50 cc  Fluoro Time: 4.2 minutes  Moderate Conscious Sedation Time: approximately 32 minutes using 2 mg of Versed  and 75 mcg of Fentanyl               Indications:  Patient is a 54 y.o.adult with peripheral arterial disease and cyanotic feet with symptoms concerning for rest pain. The patient has had a CT angiogram but it is impossible to evaluate the tibial vessels off of the study due to the dense calcification.  The patient is brought in for angiography for further evaluation and potential treatment.  Due to the limb threatening nature of the situation, angiogram was performed for attempted limb salvage. The patient is aware that if the procedure fails, amputation would be expected.  The patient also understands that even with successful revascularization, amputation may still be required due to the severity of the situation.  Risks and benefits are discussed and informed consent is  obtained.   Procedure:  The patient was identified and appropriate procedural time out was performed.  The patient was then placed supine on the table and prepped and draped in the usual sterile fashion. Moderate conscious sedation was administered during a face to face encounter with the patient throughout the procedure with my supervision of the RN administering medicines and monitoring the patient's vital signs, pulse oximetry, telemetry and mental status throughout from the start of the procedure until the patient was taken to the recovery room. Ultrasound was used to evaluate the right common femoral artery.  It was patent .  A digital ultrasound image was acquired.  A Seldinger needle was used to access the right common femoral artery under direct ultrasound guidance and a permanent image was performed.  A 0.035 J wire was advanced without resistance and a 5Fr sheath was placed.  Pigtail catheter was placed into the aorta and an AP aortogram was performed. This demonstrated small and sluggish flow in the renal arteries and normal aorta and iliac segments without significant stenosis. I then crossed the aortic bifurcation and advanced to the left femoral head. Selective left lower extremity angiogram was then performed. This demonstrated calcific vessels but no significant stenosis in the left common femoral artery and superficial femoral artery.  There was some mild disease in the below-knee popliteal artery  but this did not appear flow-limiting and appeared to be in 30-40% range.  Distal opacification of the tibials was fairly poor due to continuous patient motion.  A Kumpe catheter was used both in the posterior tibial and anterior tibial arteries perform selective imaging.  The anterior tibial artery had an occlusion at the level of the ankle with reconstitution in the foot.  The tibioperoneal trunk feeding the posterior tibial and peroneal arteries had about a 60 to 65% stenosis.  The posterior tibial  artery was the largest runoff into the foot.  The peroneal artery was small and provided little flow to the foot. It was felt that it was in the patient's best interest to proceed with intervention after these images to avoid a second procedure and a larger amount of contrast and fluoroscopy based off of the findings from the initial angiogram. The patient was systemically heparinized and a 6 French 70 cm sheath was then placed over the Terumo Advantage wire. I then used a Kumpe catheter and the advantage wire to get down into the tibial vessels and exchanged for a CXI catheter and a V18 wire.  The wire was initially taken across the tibioperoneal trunk stenosis and posterior tibial artery and the wire was parked at the ankle.  A 3.5 mm diameter by 4 cm length angioplasty balloon was inflated in the tibioperoneal trunk and was proximal posterior tibial artery and inflated up to 12 atm for 1 minute.  Completion imaging showed only about a 25% residual stenosis.  I then turned my attention to the occlusion in the distal anterior tibial artery/dorsalis pedis artery at the infra malleolar portion.  Using the CXI catheter and V18 wire I was able to cross this occlusion and parked the wire in the distal foot.  I then used a small 2 mm diameter by 10 cm length angioplasty balloon to treat the foot and ankle portion in the dorsalis pedis artery/distal anterior tibial artery inflating this up to 10 atm for 1 minute.  Completion imaging showed a marked improvement now with the vessel having inline flow continuous to the foot and only about a 20 to 30% residual stenosis.  I elected to terminate the procedure. The sheath was removed and StarClose closure device was deployed in the right femoral artery with excellent hemostatic result. The patient was taken to the recovery room in stable condition having tolerated the procedure well.  Findings:               Aortogram:  This demonstrated small and sluggish flow in the renal  arteries and normal aorta and iliac segments without significant stenosis.             Left lower Extremity:  This demonstrated calcific vessels but no significant stenosis in the left common femoral artery and superficial femoral artery.  There was some mild disease in the below-knee popliteal artery but this did not appear flow-limiting and appeared to be in 30-40% range.  Distal opacification of the tibials was fairly poor due to continuous patient motion.  A Kumpe catheter was used both in the posterior tibial and anterior tibial arteries perform selective imaging.  The anterior tibial artery had an occlusion at the level of the ankle with reconstitution in the foot.  The tibioperoneal trunk feeding the posterior tibial and peroneal arteries had about a 60 to 65% stenosis.  The posterior tibial artery was the largest runoff into the foot.  The peroneal artery was small and provided little flow  to the foot.   Disposition: Patient was taken to the recovery room in stable condition having tolerated the procedure well.  Complications: None  Selinda Gu 08/12/2024 4:31 PM   This note was created with Dragon Medical transcription system. Any errors in dictation are purely unintentional.

## 2024-08-12 NOTE — Consult Note (Signed)
 " [facility] Vascular Consult Note  MRN : [id number]  Mario [name] is a 54 y.o. (12/19/1970) adult who presents with chief complaint of  Chief Complaint  Patient presents with   Foot Pain    History of Present Illness: I am asked to see the patient by Dr. Ernest for possible ischemia as a cause of left lower extremity pain.  He said pain in both feet and lower legs but it is worse on the left.  Denies any open wounds or sores at this time.  He has longstanding end-stage renal disease and multiple other medical comorbidities and atherosclerotic risk factors.  He underwent a CT angiogram with runoff which I reviewed.  Unfortunately, as is often the case, this is not a particularly good study to evaluate distal perfusion particularly in dialysis patients or diabetics due to the dense calcification making interpretation extremely poor.  There appears to be tibial disease.  The official report was of no significant femoral-popliteal disease but I think that is questionable.  There does not appear to be aortoiliac disease which is the 1 place that CT angiogram seems to be helpful at at our institution.  Of note, he has a large volume of fluid in his abdomen consistent with ascites and he does have a history of cirrhosis.  We are consulted for further evaluation and consideration for angiogram to evaluate his lower extremity perfusion.  He does have pain with short distances of walking.  The pain does not generally wake him at night.  Both legs have symptoms, but the left leg is the worst.  Current Facility-Administered Medications  Medication Dose Route Frequency Provider Last Rate Last Admin   0.9 %  sodium chloride  infusion   Intravenous Continuous Jaskarn Schweer S, MD       0.9 %  sodium chloride  infusion   Intravenous Continuous Jorgeluis Gurganus S, MD 20 mL/hr at 08/12/24 1427 New Bag at 08/12/24 1427   [MAR Hold] acetaminophen  (TYLENOL ) tablet 650 mg  650 mg Oral Q6H PRN  Laurita Cort DASEN, MD       Or   ILDA Hold] acetaminophen  (TYLENOL ) suppository 650 mg  650 mg Rectal Q6H PRN Laurita Cort DASEN, MD       [facility] Hold] amLODipine  (NORVASC ) tablet 5 mg  5 mg Oral Daily Laurita, Ping T, MD   5 mg at 08/11/24 1330   ceFAZolin  (ANCEF ) IVPB 1 g/50 mL premix  1 g Intravenous Once Cleophus Mendonsa S, MD       [MAR Hold] Chlorhexidine  Gluconate Cloth 2 % PADS 6 each  6 each Topical Q0600 Druscilla Bald, NP   6 each at 08/12/24 0634   [MAR Hold] feeding supplement (NEPRO CARB STEADY) liquid 237 mL  237 mL Oral BID BM Zhang, Ping T, MD   237 mL at 08/11/24 1331   [MAR Hold] heparin  injection 5,000 Units  5,000 Units Subcutaneous Q12H Laurita Cort T, MD   5,000 Units at 08/12/24 0937   [MAR Hold] HYDROmorphone  (DILAUDID ) injection 0.5-1 mg  0.5-1 mg Intravenous Q2H PRN Laurita Cort T, MD   0.5 mg at 08/12/24 1421   [MAR Hold] LORazepam  (ATIVAN ) injection 0.5 mg  0.5 mg Intravenous Q6H PRN Laurita Cort DASEN, MD       [MAR Hold] ondansetron  (ZOFRAN ) tablet 4 mg  4 mg Oral Q6H PRN Laurita Cort DASEN, MD       Or   ILDA Hold] ondansetron  (ZOFRAN ) injection 4 mg  4 mg  Intravenous Q6H PRN Laurita Manor T, MD   4 mg at 08/11/24 1803   [MAR Hold] prednisoLONE  acetate (PRED FORTE ) 1 % ophthalmic suspension 1-2 drop  1-2 drop Right Eye Q4H PRN Laurita Manor DASEN, MD       [facility] Hold] senna-docusate (Senokot-S) tablet 1 tablet  1 tablet Oral QHS PRN Laurita Manor DASEN, MD       [facility] Hold] traZODone  (DESYREL ) tablet 25 mg  25 mg Oral QHS PRN Laurita Manor DASEN, MD   25 mg at 08/11/24 2229    Past Medical History:  Diagnosis Date   Abdominal pain 08/28/2016   Acute hyperkalemia 10/28/2016   Chest pain 04/11/2016   Chronic combined systolic and diastolic CHF (congestive heart failure) (HCC)    Cirrhosis (HCC)    Dialysis patient    Edentulous    ESRD (end stage renal disease) on dialysis (HCC) 01/25/2015   Fluid overload 04/12/2016   HTN (hypertension) 10/13/2016   Hyperkalemia 11/27/2016   Hypertension    Mitral  regurgitation    Pulmonary edema 07/03/2016   Renal disorder    Renal insufficiency    Scrotal infection 01/25/2015    Past Surgical History:  Procedure Laterality Date   ANTERIOR CERVICAL CORPECTOMY N/A 04/05/2019   Procedure: ANTERIOR CERVICAL CORPECTOMY Cervical six and Cervical seven with strut graft fusion from Cervical five-Thoracic one   ;  Surgeon: Louis Shove, MD;  Location[facility] OR;  Service: Neurosurgery;  Laterality: N/A;   AV FISTULA PLACEMENT     POSTERIOR CERVICAL FUSION/FORAMINOTOMY N/A 04/05/2019   Procedure: Posterior cervical fusion utilizing segmental instrumentation from Cervical five-Thoracic one ;  Surgeon: Louis Shove, MD;  Location: [facility] OR;  Service: Neurosurgery;  Laterality: N/A;    Social History[1]   Family History  Problem Relation Age of Onset   Kidney failure Father    Diabetes Mother   No bleeding or clotting disorders  Allergies[2]   REVIEW OF SYSTEMS (Negative unless checked)  Constitutional: [] Weight loss  [] Fever  [] Chills Cardiac: [] Chest pain   [] Chest pressure   [] Palpitations   [] Shortness of breath when laying flat   [] Shortness of breath at rest   [] Shortness of breath with exertion. Vascular:  [x] Pain in legs with walking   [] Pain in legs at rest   [] Pain in legs when laying flat   [x] Claudication   [] Pain in feet when walking  [x] Pain in feet at rest  [] Pain in feet when laying flat   [] History of DVT   [] Phlebitis   [x] Swelling in legs   [] Varicose veins   [] Non-healing ulcers Pulmonary:   [] Uses home oxygen   [] Productive cough   [] Hemoptysis   [] Wheeze  [] COPD   [] Asthma Neurologic:  [] Dizziness  [] Blackouts   [] Seizures   [] History of stroke   [] History of TIA  [] Aphasia   [] Temporary blindness   [] Dysphagia   [] Weakness or numbness in arms   [] Weakness or numbness in legs Musculoskeletal:  [] Arthritis   [] Joint swelling   [x] Joint pain   [] Low back pain Hematologic:  [] Easy bruising  [] Easy bleeding   [] Hypercoagulable state   [x] Anemic   [] Hepatitis Gastrointestinal:  [] Blood in stool   [] Vomiting blood  [] Gastroesophageal reflux/heartburn   [] Difficulty swallowing. Genitourinary:  [x] Chronic kidney disease   [] Difficult urination  [] Frequent urination  [] Burning with urination   [] Blood in urine Skin:  [] Rashes   [] Ulcers   [] Wounds Psychological:  [] History of anxiety   []  History of major depression.  Physical Examination  Vitals:   08/12/24 1330 08/12/24 1335 08/12/24 1345 08/12/24 1413  BP: (!) 143/69 (!) 155/63 138/71 (!) 156/77  Pulse: 66 70 64 66  Resp: 15 19 13 16   Temp:  97.8 F (36.6 C)  97.9 F (36.6 C)  TempSrc:  Oral  Temporal  SpO2: 99% 98% 99% 97%  Weight:  66.8 kg    Height:       Body mass index is 26.09 kg/m. Gen:  WD/WN, NAD.  Appears far older than stated age Head: [place]/AT, No temporalis wasting.  Ear/Nose/Throat: Hearing grossly intact, nares w/o erythema or drainage, oropharynx w/o Erythema/Exudate Eyes: Sclera non-icteric, conjunctiva clear Neck: Trachea midline.  No JVD.  Pulmonary:  Good air movement, respirations not labored, equal bilaterally.  Cardiac: RRR, normal S1, S2. Vascular:  Vessel Right Left  Radial Palpable Palpable                          PT Trace Palpable Not Palpable  DP Not Palpable 1+ Palpable   Gastrointestinal: soft, non-tender/non-distended. No guarding/reflex.  Musculoskeletal: M/S 5/5 throughout.  Extremities with purplish discoloration and skin thickening with mild to moderate lower extremity edema bilaterally. Neurologic: Sensation grossly intact in extremities.  Symmetrical.  Speech is fluent. Motor exam as listed above. Psychiatric: Judgment intact, Mood & affect appropriate for pt's clinical situation. Dermatologic: No rashes or ulcers noted.  No cellulitis or open wounds.      CBC Lab Results  Component Value Date   WBC 4.7 08/12/2024   HGB 11.9 (L) 08/12/2024   HCT 36.6 (L) 08/12/2024   MCV 103.4 (H) 08/12/2024   PLT 102 (L) 08/12/2024     BMET    Component Value Date/Time   NA 134 (L) 08/12/2024 0600   K 5.4 (H) 08/12/2024 0600   CL 96 (L) 08/12/2024 0600   CO2 22 08/12/2024 0600   GLUCOSE 72 08/12/2024 0600   BUN 46 (H) 08/12/2024 0600   CREATININE 8.16 (H) 08/12/2024 0600   CALCIUM  9.7 08/12/2024 0600   GFRNONAA 7 (L) 08/12/2024 0600   GFRAA 9 (L) 04/16/2020 0427   Estimated Creatinine Clearance (by C-G formula based on SCr of 8.16 mg/dL (H)) Male: 7.2 mL/min (A) Male: 8.3 mL/min (A)  COAG Lab Results  Component Value Date   INR 1.4 (H) 10/16/2021   INR 1.4 (H) 10/15/2021   INR 1.3 (H) 04/15/2020    Radiology DG Hand Complete Left Result Date: 08/11/2024 EXAM: 3 OR MORE VIEW(S) XRAY OF THE LEFT HAND 08/11/2024 05:18:00 PM COMPARISON: None available. CLINICAL HISTORY: Foreign body (FB) in soft tissue. FINDINGS: BONES AND JOINTS: No acute fracture. Subluxation of the third metacarpophalangeal joint. Joint space narrowing of the third metacarpophalangeal joint, with erosive changes at the third metacarpal head and base of the third proximal phalanx, consistent with sequela of inflammatory or infectious arthropathy. SOFT TISSUES: Diffuse vascular calcifications. Diffuse soft tissue swelling of the third digit. No definitive radiopaque foreign body identified. IMPRESSION: 1. Diffuse soft tissue swelling of the third digit without a definite radiopaque foreign body. 2. Third metacarpophalangeal joint subluxation with erosive changes, most consistent with inflammatory or infectious arthropathy. 3. Diffuse vascular calcifications. Electronically signed by: Greig Pique MD MD 08/11/2024 08:06 PM EST RP Workstation: [id number]   MR HAND LEFT WO CONTRAST Result Date: 08/11/2024 EXAM: MR LEFT HAND WITHOUT INTRAVENOUS CONTRAST 08/11/2024 03:16:04 PM TECHNIQUE: Multiplanar magnetic resonance images of the left hand without intravenous contrast. COMPARISON: None available. CLINICAL HISTORY: Nonhealing  wound along the dorsal  middle finger MCP joint, reduced range of motion of the middle finger. FINDINGS: LIMITATIONS/ARTIFACTS: Motion artifact is present, reducing diagnostic sensitivity and specificity. SOFT TISSUES: The dorsal soft tissue ulceration along the middle finger MCP joint region is shown on image 25 series 4. Low grade subcutaneous edema in the interosseous muscle adjacent to the distal middle finger metacarpal. JOINTS: Possible erosion of the dorsal base of the proximal phalanx with anterior subluxation of the proximal phalanx with respect to the metacarpal head. BONES: Underlying reported ulceration along the middle finger MCP joint, there is abnormal marrow edema in the base of the proximal phalanx and in the head of the middle finger metacarpal suspicious for active osteomyelitis. IMPRESSION: 1. Abnormal marrow edema in the base of the proximal phalanx and in the head of the middle finger metacarpal, suspicious for active osteomyelitis, with overlying dorsal ulceration. 2. Erosion of the dorsal base of the proximal phalanx middle finger with anterior subluxation of the proximal phalanx with respect to the metacarpal head. 3. Low grade adjacent interosseous muscle and subcutaneous edema. Adjacent to the distal 3rd digit metacarpal. 4. Motion artifact reduces diagnostic sensitivity and specificity. Electronically signed by: Ryan Salvage MD MD 08/11/2024 03:47 PM EST RP Workstation: [id number]   MR FOOT LEFT WO CONTRAST Result Date: 08/11/2024 EXAM: MRI of the left Foot without contrast. 08/11/2024 03:00:40 PM TECHNIQUE: Multiplanar multisequence MRI of the left foot was performed without the administration of intravenous contrast. COMPARISON: 08/12/2023. CLINICAL HISTORY: left foot pain for 7 days with clinical suspicion for osteomyelitis. FINDINGS: LIMITATIONS/ARTIFACTS: Motion artifact is present, reducing diagnostic sensitivity and specificity. LISFRANC JOINT: Visualized Lisfranc ligament is intact. No significant  Lisfranc interval widening or significant periligamentous edema. BONE MARROW: Small lateral erosion of the head of the 5th metatarsal laterally with adjacent marrow edema for example on image 14 series 8 and image 21 series 5, with thinning of the overlying superficial soft tissues in this vicinity raising the possibility of ulceration. Although the appearance is not classic for osteomyelitis, the presence of the immediately adjacent cutaneous defect in the vicinity of the osseous erosion does raise the possibility of early osteomyelitis in the head of the 5th metatarsal. No acute fracture or aggressive marrow replacing lesion. GREATER AND LESSER MTP JOINTS: Mild degenerative arthropathy at the 1st metatarsophalangeal joint. The 5th metatarsophalangeal joint demonstrates a small lateral erosion of the head of the 5th metatarsal laterally with adjacent marrow edema, as described in the Bone Marrow section. Normal alignment. SOFT TISSUES: Thinning of the overlying superficial soft tissues in the vicinity of the 5th metatarsal head, raising the possibility of ulceration. Trace subcutaneous edema along the dorsum of the forefoot, cellulitis not excluded. TENDONS: Visualized flexor and extensor tendons are intact. IMPRESSION: 1. Small lateral erosion of the head of the 5th metatarsal with adjacent marrow edema and overlying soft tissue thinning/possible ulceration, raising concern for early osteomyelitis. 2. Trace subcutaneous edema along the dorsum of the forefoot, which can be seen with cellulitis. 3. Mild degenerative arthropathy at the 1st metatarsophalangeal joint. Electronically signed by: Ryan Salvage MD MD 08/11/2024 03:40 PM EST RP Workstation: [id number]   CT Angio Aortobifemoral W and/or Wo Contrast Result Date: 08/11/2024 CLINICAL DATA:  Ischemic left foot. End-stage renal disease on hemodialysis. EXAM: CT ANGIOGRAPHY OF ABDOMINAL AORTA WITH ILIOFEMORAL RUNOFF TECHNIQUE: Multidetector CT imaging of the  abdomen, pelvis and lower extremities was performed using the standard protocol during bolus administration of intravenous contrast. Multiplanar CT image reconstructions and MIPs were obtained  to evaluate the vascular anatomy. RADIATION DOSE REDUCTION: This exam was performed according to the departmental dose-optimization program which includes automated exposure control, adjustment of the mA and/or kV according to patient size and/or use of iterative reconstruction technique. CONTRAST:  OMNIPAQUE  IOHEXOL  350 MG/ML SOLN COMPARISON:  Prior CT abdomen/pelvis 02/03/2022 FINDINGS: VASCULAR Aorta: Normal caliber aorta without aneurysm, dissection, vasculitis or significant stenosis. Celiac: Patent without evidence of aneurysm, dissection, vasculitis or significant stenosis. SMA: Patent without evidence of aneurysm, dissection, vasculitis or significant stenosis. Renals: Hypoplastic renal arteries due to longstanding chronic renal failure and renal atrophy. IMA: Moderate to high-grade stenosis of the proximal IMA. The vessel remains patent. RIGHT Lower Extremity Inflow: Common, internal and external iliac arteries are patent without evidence of aneurysm, dissection, vasculitis or significant stenosis. Outflow: Common, superficial and profunda femoral arteries and the popliteal artery are patent without evidence of aneurysm, dissection, vasculitis or significant stenosis. Runoff: Limited evaluation of the runoff arteries due to extensive medial arteriosclerosis. The circumferential calcium  in the small vessels limits evaluation of the arterial lumen. LEFT Lower Extremity Inflow: Common, internal and external iliac arteries are patent without evidence of aneurysm, dissection, vasculitis or significant stenosis. Outflow: Common, superficial and profunda femoral arteries and the popliteal artery are patent without evidence of aneurysm, dissection, vasculitis or significant stenosis. Runoff: Limited evaluation of the  runoff arteries due to extensive medial arteriosclerosis. The circumferential calcium  in the small vessels limits evaluation of the arterial lumen. Veins: Limited evaluation given arterial phase timing of the exam. There is peripheral calcification of the inferior vena cava and bilateral iliac veins which may represent chronic calcinosis, or potentially sequelae of prior ileo caval DVT. Changes are stable over many years. Review of the MIP images confirms the above findings. NON-VASCULAR Lower chest: No acute abnormality. Mild cardiomegaly. Calcifications along the coronary arteries. Large para esophageal varices. Hepatobiliary: Advanced hepatic cirrhosis. No arterially enhancing lesion to suggest the presence of hepatocellular carcinoma. Gallbladder is unremarkable. No intra or extrahepatic biliary ductal dilatation. Pancreas: Unremarkable. No pancreatic ductal dilatation or surrounding inflammatory changes. Spleen: Mild splenomegaly. Adrenals/Urinary Tract: Unremarkable adrenal glands. Atrophied native kidneys. The bladder is decompressed. Stomach/Bowel: No focal bowel wall thickening or evidence of obstruction. Lymphatic: No suspicious lymphadenopathy. Reproductive: Unremarkable prostate gland. Other: Moderate ascites. Musculoskeletal: No acute fracture or aggressive appearing lytic or blastic osseous lesion. IMPRESSION: VASCULAR 1. No evidence of hemodynamically significant stenosis or occlusive disease in the inflow (aortoiliac) or outflow (femoropopliteal) segments. Evaluation of the runoff (infrageniculate) arteries is limited by severe medial arteriosclerosis. The circumferential calcification of the medium and small artery walls creates streak artifact which severely limits evaluation of the internal lumens. Catheter directed angiography could be considered if clinically warranted. 2. Extensive peripheral calcified atherosclerotic plaque. 3. Extensive peripheral calcification of the inferior vena cava and  bilateral iliac vessels, similar compared to prior. 4. No evidence of arterial aneurysm or dissection. NON-VASCULAR 1. Severe hepatic cirrhosis with splenomegaly, esophageal and paraesophageal varices and moderate ascites. 2. No acute abnormality within the abdomen or pelvis. 3. Similar appearance of atrophic native kidneys. Electronically Signed   By: Wilkie Lent M.D.   On: 08/11/2024 10:25   US  Venous Img Lower Unilateral Left Result Date: 08/11/2024 EXAM: ULTRASOUND DUPLEX OF THE LEFT LOWER EXTREMITY VEINS TECHNIQUE: Duplex ultrasound using B-mode/gray scaled imaging and Doppler spectral analysis and color flow was obtained of the deep venous structures of the left lower extremity. COMPARISON: None available. CLINICAL HISTORY: 54 year old male with lower extremity pain and color changes.  FINDINGS: The common femoral vein, femoral vein, popliteal vein, and visible calf veins demonstrate normal compressibility with normal color flow and spectral analysis. Evidence of calcified peripheral vascular disease. The right contralateral common femoral vein appears patent and unremarkable. IMPRESSION: 1. No evidence of left lower extremity DVT. 2. Suspect  calcified peripheral vascular disease in the left lower extremity. Electronically signed by: Helayne Hurst MD MD 08/11/2024 09:35 AM EST RP Workstation: [id number]   DG Foot 2 Views Left Result Date: 08/11/2024 EXAM: 2 VIEW(S) XRAY OF THE LEFT FOOT 08/11/2024 07:32:00 AM COMPARISON: CT left foot 01/22/2018. CLINICAL HISTORY: pain pain FINDINGS: BONES AND JOINTS: Osteopenia. There is a chronic healed fracture deformity of the neck of the 5th metatarsal. No acute fracture. No findings of acute osteomyelitis. No malalignment. Arthritic changes are not seen. SOFT TISSUES: There are extensive vascular calcifications of the distal foreleg and foot. Mild generalized edema. There is mild fusiform thickening in the distal achilles tendon. There is no visible soft tissue  gas or foreign body. IMPRESSION: 1. No acute osseous abnormality. 2. Extensive vascular calcifications in the distal foreleg and foot. 3. Soft tissue swelling. 4. Osteopenia. Electronically signed by: Francis Quam MD 08/11/2024 07:40 AM EST RP Workstation: [id number]      Assessment/Plan 1.  Peripheral arterial disease.  CT angiogram as described above which is limited due to his dense calcification.  There is clearly significant tibial disease and potentially femoral-popliteal disease on each side.  His left leg symptoms are the more severe of the 2, and his symptoms are concerning for rest pain on that side.  I think it is reasonable to proceed with angiography with possible intervention depending on the findings at the time of angiography.  I discussed the risks and benefits of the procedure with the patient and he is desirable to proceed. 2.  End-stage renal disease.  Dense calcifications present throughout much of his vasculature.  He had dialysis earlier today but will be getting more contrast load today so I will defer to the nephrology service whether or not they would like to do dialysis tomorrow. 3.  Hypertension.  Stable on outpatient medications and blood glucose control important in reducing the progression of atherosclerotic disease. Also, involved in wound healing. On appropriate medications. 4.  Cirrhosis.  Marked ascites is present.  This may explain some of his lower extremity swelling as well.   Selinda Gu, MD  08/12/2024 2:32 PM    This note was created with Dragon medical transcription system.  Any error is purely unintentional     [1]  Social History Tobacco Use   Smoking status: Every Day    Current packs/day: 0.25    Average packs/day: 0.3 packs/day for 25.0 years (6.3 ttl pk-yrs)    Types: Cigarettes   Smokeless tobacco: Never  Substance Use Topics   Alcohol use: No    Comment: pt denies   Drug use: No  [2]  Allergies Allergen Reactions   Betadine  [Povidone Iodine ] Rash   Povidone-Iodine  Dermatitis   "

## 2024-08-12 NOTE — Progress Notes (Signed)
 " [facility]  ROUNDING NOTE   Subjective:   Mario [name] is a 54 y.o. male with past medical history including hypertension, neuropathy, chronic liver cirrhosis, portal hypertension, and end-stage renal disease on hemodialysis. Patient seen in ED for foot pain and swelling. He has been admitted for Osteomyelitis [facility]) [M86.9]  Patient is known to our practice and receives outpatient dialysis treatments at [facility] on a TTS schedule, overseen by [employer]. Was due for dialysis yesterday and refused. Agreeable today. Room air. Denies pain and discomfort.   Labs unremarkable in ED for renal patient. Imaging has been negative for osteomyelitis. Vascular has been consulted for angiogram.   We have been consulted to manage dialysis needs.  Objective:  Vital signs in last 24 hours:  Temp:  [97.5 F (36.4 C)-98.6 F (37 C)] 97.8 F (36.6 C) (01/09 1045) Pulse Rate:  [58-71] 61 (01/09 1230) Resp:  [11-20] 12 (01/09 1230) BP: (91-193)/(27-112) 104/36 (01/09 1230) SpO2:  [97 %-100 %] 100 % (01/09 1230) Weight:  [67 kg] 67 kg (01/09 1045)  Weight change:  Filed Weights   08/11/24 0648 08/12/24 1045  Weight: 74.8 kg 67 kg    Intake/Output: I/O last 3 completed shifts: In: 460 [P.O.:360; IV Piggyback:100] Out: 200 [Emesis/NG output:200]   Intake/Output this shift:  No intake/output data recorded.  Physical Exam: General: NAD  Head: Normocephalic, atraumatic. Moist oral mucosal membranes  Eyes: Anicteric  Lungs:  Clear to auscultation, normal effort  Heart: Regular rate and rhythm  Abdomen:  Soft, nontender  Extremities:  No peripheral edema.  Neurologic: Awake, alert, conversant  Skin: Warm,dry  Access: Lt AVF    Basic Metabolic Panel: Recent Labs  Lab 08/11/24 0726 08/12/24 0600  NA 135 134*  K 4.5 5.4*  CL 94* 96*  CO2 25 22  GLUCOSE 73 72  BUN 40* 46*  CREATININE 7.12* 8.16*  CALCIUM  9.8 9.7    Liver Function  Tests: Recent Labs  Lab 08/11/24 0726  AST 36  ALT 13  ALKPHOS 188*  BILITOT 0.8  PROT 7.6  ALBUMIN 4.0   No results for input(s): LIPASE, AMYLASE in the last 168 hours. No results for input(s): AMMONIA in the last 168 hours.  CBC: Recent Labs  Lab 08/11/24 0726 08/12/24 1100  WBC 5.3 4.7  NEUTROABS 3.8  --   HGB 12.5* 11.9*  HCT 39.0 36.6*  MCV 104.0* 103.4*  PLT 105* 102*    Cardiac Enzymes: No results for input(s): CKTOTAL, CKMB, CKMBINDEX, TROPONINI in the last 168 hours.  BNP: Invalid input(s): POCBNP  CBG: No results for input(s): GLUCAP in the last 168 hours.  Microbiology: Results for orders placed or performed during the hospital encounter of 08/11/24  Blood culture (routine x 2)     Status: None (Preliminary result)   Collection Time: 08/11/24  7:15 AM   Specimen: BLOOD  Result Value Ref Range Status   Specimen Description BLOOD RIGHT ANTECUBITAL  Final   Special Requests   Final    BOTTLES DRAWN AEROBIC AND ANAEROBIC Blood Culture adequate volume   Culture   Final    NO GROWTH < 24 HOURS Performed at [facility], [address] [place], [place] [address]    Report Status PENDING  Incomplete  Blood culture (routine x 2)     Status: None (Preliminary result)   Collection Time: 08/11/24  7:26 AM   Specimen: BLOOD  Result Value Ref Range Status   Specimen Description BLOOD  BLOOD RIGHT HAND  Final   Special Requests   Final    BOTTLES DRAWN AEROBIC AND ANAEROBIC Blood Culture results may not be optimal due to an inadequate volume of blood received in culture bottles   Culture   Final    NO GROWTH < 24 HOURS Performed at [facility], [address] [place], [place] [address]    Report Status PENDING  Incomplete  MRSA Next Gen by PCR, Nasal     Status: None   Collection Time: 08/11/24  4:00 PM   Specimen: Nasal Mucosa; Nasal Swab  Result Value Ref Range Status   MRSA by PCR Next Gen NOT DETECTED NOT  DETECTED Final    Comment: (NOTE) The GeneXpert MRSA Assay (FDA approved for NASAL specimens only), is one component of a comprehensive MRSA colonization surveillance program. It is not intended to diagnose MRSA infection nor to guide or monitor treatment for MRSA infections. Test performance is not FDA approved in patients less than 72 years old. Performed at [facility], [address] [place], [place] [address]     Coagulation Studies: No results for input(s): LABPROT, INR in the last 72 hours.  Urinalysis: No results for input(s): COLORURINE, LABSPEC, PHURINE, GLUCOSEU, HGBUR, BILIRUBINUR, KETONESUR, PROTEINUR, UROBILINOGEN, NITRITE, LEUKOCYTESUR in the last 72 hours.  Invalid input(s): APPERANCEUR    Imaging: DG Hand Complete Left Result Date: 08/11/2024 EXAM: 3 OR MORE VIEW(S) XRAY OF THE LEFT HAND 08/11/2024 05:18:00 PM COMPARISON: None available. CLINICAL HISTORY: Foreign body (FB) in soft tissue. FINDINGS: BONES AND JOINTS: No acute fracture. Subluxation of the third metacarpophalangeal joint. Joint space narrowing of the third metacarpophalangeal joint, with erosive changes at the third metacarpal head and base of the third proximal phalanx, consistent with sequela of inflammatory or infectious arthropathy. SOFT TISSUES: Diffuse vascular calcifications. Diffuse soft tissue swelling of the third digit. No definitive radiopaque foreign body identified. IMPRESSION: 1. Diffuse soft tissue swelling of the third digit without a definite radiopaque foreign body. 2. Third metacarpophalangeal joint subluxation with erosive changes, most consistent with inflammatory or infectious arthropathy. 3. Diffuse vascular calcifications. Electronically signed by: Greig Pique MD MD 08/11/2024 08:06 PM EST RP Workstation: [id number]   MR HAND LEFT WO CONTRAST Result Date: 08/11/2024 EXAM: MR LEFT HAND WITHOUT INTRAVENOUS CONTRAST 08/11/2024 03:16:04 PM TECHNIQUE:  Multiplanar magnetic resonance images of the left hand without intravenous contrast. COMPARISON: None available. CLINICAL HISTORY: Nonhealing wound along the dorsal middle finger MCP joint, reduced range of motion of the middle finger. FINDINGS: LIMITATIONS/ARTIFACTS: Motion artifact is present, reducing diagnostic sensitivity and specificity. SOFT TISSUES: The dorsal soft tissue ulceration along the middle finger MCP joint region is shown on image 25 series 4. Low grade subcutaneous edema in the interosseous muscle adjacent to the distal middle finger metacarpal. JOINTS: Possible erosion of the dorsal base of the proximal phalanx with anterior subluxation of the proximal phalanx with respect to the metacarpal head. BONES: Underlying reported ulceration along the middle finger MCP joint, there is abnormal marrow edema in the base of the proximal phalanx and in the head of the middle finger metacarpal suspicious for active osteomyelitis. IMPRESSION: 1. Abnormal marrow edema in the base of the proximal phalanx and in the head of the middle finger metacarpal, suspicious for active osteomyelitis, with overlying dorsal ulceration. 2. Erosion of the dorsal base of the proximal phalanx middle finger with anterior subluxation of the proximal phalanx with respect to the metacarpal head. 3. Low grade adjacent interosseous muscle and subcutaneous edema.  Adjacent to the distal 3rd digit metacarpal. 4. Motion artifact reduces diagnostic sensitivity and specificity. Electronically signed by: Ryan Salvage MD MD 08/11/2024 03:47 PM EST RP Workstation: [id number]   MR FOOT LEFT WO CONTRAST Result Date: 08/11/2024 EXAM: MRI of the left Foot without contrast. 08/11/2024 03:00:40 PM TECHNIQUE: Multiplanar multisequence MRI of the left foot was performed without the administration of intravenous contrast. COMPARISON: 08/12/2023. CLINICAL HISTORY: left foot pain for 7 days with clinical suspicion for osteomyelitis. FINDINGS:  LIMITATIONS/ARTIFACTS: Motion artifact is present, reducing diagnostic sensitivity and specificity. LISFRANC JOINT: Visualized Lisfranc ligament is intact. No significant Lisfranc interval widening or significant periligamentous edema. BONE MARROW: Small lateral erosion of the head of the 5th metatarsal laterally with adjacent marrow edema for example on image 14 series 8 and image 21 series 5, with thinning of the overlying superficial soft tissues in this vicinity raising the possibility of ulceration. Although the appearance is not classic for osteomyelitis, the presence of the immediately adjacent cutaneous defect in the vicinity of the osseous erosion does raise the possibility of early osteomyelitis in the head of the 5th metatarsal. No acute fracture or aggressive marrow replacing lesion. GREATER AND LESSER MTP JOINTS: Mild degenerative arthropathy at the 1st metatarsophalangeal joint. The 5th metatarsophalangeal joint demonstrates a small lateral erosion of the head of the 5th metatarsal laterally with adjacent marrow edema, as described in the Bone Marrow section. Normal alignment. SOFT TISSUES: Thinning of the overlying superficial soft tissues in the vicinity of the 5th metatarsal head, raising the possibility of ulceration. Trace subcutaneous edema along the dorsum of the forefoot, cellulitis not excluded. TENDONS: Visualized flexor and extensor tendons are intact. IMPRESSION: 1. Small lateral erosion of the head of the 5th metatarsal with adjacent marrow edema and overlying soft tissue thinning/possible ulceration, raising concern for early osteomyelitis. 2. Trace subcutaneous edema along the dorsum of the forefoot, which can be seen with cellulitis. 3. Mild degenerative arthropathy at the 1st metatarsophalangeal joint. Electronically signed by: Ryan Salvage MD MD 08/11/2024 03:40 PM EST RP Workstation: [id number]   CT Angio Aortobifemoral W and/or Wo Contrast Result Date: 08/11/2024 CLINICAL  DATA:  Ischemic left foot. End-stage renal disease on hemodialysis. EXAM: CT ANGIOGRAPHY OF ABDOMINAL AORTA WITH ILIOFEMORAL RUNOFF TECHNIQUE: Multidetector CT imaging of the abdomen, pelvis and lower extremities was performed using the standard protocol during bolus administration of intravenous contrast. Multiplanar CT image reconstructions and MIPs were obtained to evaluate the vascular anatomy. RADIATION DOSE REDUCTION: This exam was performed according to the departmental dose-optimization program which includes automated exposure control, adjustment of the mA and/or kV according to patient size and/or use of iterative reconstruction technique. CONTRAST:  OMNIPAQUE  IOHEXOL  350 MG/ML SOLN COMPARISON:  Prior CT abdomen/pelvis 02/03/2022 FINDINGS: VASCULAR Aorta: Normal caliber aorta without aneurysm, dissection, vasculitis or significant stenosis. Celiac: Patent without evidence of aneurysm, dissection, vasculitis or significant stenosis. SMA: Patent without evidence of aneurysm, dissection, vasculitis or significant stenosis. Renals: Hypoplastic renal arteries due to longstanding chronic renal failure and renal atrophy. IMA: Moderate to high-grade stenosis of the proximal IMA. The vessel remains patent. RIGHT Lower Extremity Inflow: Common, internal and external iliac arteries are patent without evidence of aneurysm, dissection, vasculitis or significant stenosis. Outflow: Common, superficial and profunda femoral arteries and the popliteal artery are patent without evidence of aneurysm, dissection, vasculitis or significant stenosis. Runoff: Limited evaluation of the runoff arteries due to extensive medial arteriosclerosis. The circumferential calcium  in the small vessels limits evaluation of the arterial lumen. LEFT Lower Extremity Inflow:  Common, internal and external iliac arteries are patent without evidence of aneurysm, dissection, vasculitis or significant stenosis. Outflow: Common, superficial and  profunda femoral arteries and the popliteal artery are patent without evidence of aneurysm, dissection, vasculitis or significant stenosis. Runoff: Limited evaluation of the runoff arteries due to extensive medial arteriosclerosis. The circumferential calcium  in the small vessels limits evaluation of the arterial lumen. Veins: Limited evaluation given arterial phase timing of the exam. There is peripheral calcification of the inferior vena cava and bilateral iliac veins which may represent chronic calcinosis, or potentially sequelae of prior ileo caval DVT. Changes are stable over many years. Review of the MIP images confirms the above findings. NON-VASCULAR Lower chest: No acute abnormality. Mild cardiomegaly. Calcifications along the coronary arteries. Large para esophageal varices. Hepatobiliary: Advanced hepatic cirrhosis. No arterially enhancing lesion to suggest the presence of hepatocellular carcinoma. Gallbladder is unremarkable. No intra or extrahepatic biliary ductal dilatation. Pancreas: Unremarkable. No pancreatic ductal dilatation or surrounding inflammatory changes. Spleen: Mild splenomegaly. Adrenals/Urinary Tract: Unremarkable adrenal glands. Atrophied native kidneys. The bladder is decompressed. Stomach/Bowel: No focal bowel wall thickening or evidence of obstruction. Lymphatic: No suspicious lymphadenopathy. Reproductive: Unremarkable prostate gland. Other: Moderate ascites. Musculoskeletal: No acute fracture or aggressive appearing lytic or blastic osseous lesion. IMPRESSION: VASCULAR 1. No evidence of hemodynamically significant stenosis or occlusive disease in the inflow (aortoiliac) or outflow (femoropopliteal) segments. Evaluation of the runoff (infrageniculate) arteries is limited by severe medial arteriosclerosis. The circumferential calcification of the medium and small artery walls creates streak artifact which severely limits evaluation of the internal lumens. Catheter directed  angiography could be considered if clinically warranted. 2. Extensive peripheral calcified atherosclerotic plaque. 3. Extensive peripheral calcification of the inferior vena cava and bilateral iliac vessels, similar compared to prior. 4. No evidence of arterial aneurysm or dissection. NON-VASCULAR 1. Severe hepatic cirrhosis with splenomegaly, esophageal and paraesophageal varices and moderate ascites. 2. No acute abnormality within the abdomen or pelvis. 3. Similar appearance of atrophic native kidneys. Electronically Signed   By: Wilkie Lent M.D.   On: 08/11/2024 10:25   US  Venous Img Lower Unilateral Left Result Date: 08/11/2024 EXAM: ULTRASOUND DUPLEX OF THE LEFT LOWER EXTREMITY VEINS TECHNIQUE: Duplex ultrasound using B-mode/gray scaled imaging and Doppler spectral analysis and color flow was obtained of the deep venous structures of the left lower extremity. COMPARISON: None available. CLINICAL HISTORY: 53 year old male with lower extremity pain and color changes. FINDINGS: The common femoral vein, femoral vein, popliteal vein, and visible calf veins demonstrate normal compressibility with normal color flow and spectral analysis. Evidence of calcified peripheral vascular disease. The right contralateral common femoral vein appears patent and unremarkable. IMPRESSION: 1. No evidence of left lower extremity DVT. 2. Suspect  calcified peripheral vascular disease in the left lower extremity. Electronically signed by: Helayne Hurst MD MD 08/11/2024 09:35 AM EST RP Workstation: [id number]   DG Foot 2 Views Left Result Date: 08/11/2024 EXAM: 2 VIEW(S) XRAY OF THE LEFT FOOT 08/11/2024 07:32:00 AM COMPARISON: CT left foot 01/22/2018. CLINICAL HISTORY: pain pain FINDINGS: BONES AND JOINTS: Osteopenia. There is a chronic healed fracture deformity of the neck of the 5th metatarsal. No acute fracture. No findings of acute osteomyelitis. No malalignment. Arthritic changes are not seen. SOFT TISSUES: There are  extensive vascular calcifications of the distal foreleg and foot. Mild generalized edema. There is mild fusiform thickening in the distal achilles tendon. There is no visible soft tissue gas or foreign body. IMPRESSION: 1. No acute osseous abnormality. 2. Extensive vascular calcifications in  the distal foreleg and foot. 3. Soft tissue swelling. 4. Osteopenia. Electronically signed by: Francis Quam MD 08/11/2024 07:40 AM EST RP Workstation: [id number]     Medications:    sodium chloride      sodium chloride       amLODipine   5 mg Oral Daily   Chlorhexidine  Gluconate Cloth  6 each Topical Q0600   feeding supplement (NEPRO CARB STEADY)  237 mL Oral BID BM   heparin   5,000 Units Subcutaneous Q12H   acetaminophen  **OR** acetaminophen , HYDROmorphone  (DILAUDID ) injection, LORazepam , ondansetron  **OR** ondansetron  (ZOFRAN ) IV, prednisoLONE  acetate, senna-docusate, traZODone   Assessment/ Plan:  Ms. Mario [name] is a 54 y.o.  adult with past medical history including hypertension, neuropathy, chronic liver cirrhosis, portal hypertension, and end-stage renal disease on hemodialysis. Patient seen in ED for foot pain and swelling. He has been admitted for Osteomyelitis (HCC) [M86.9]  [facility]/TTS/Lt AVF  End stage renal disease on hemodialysis. Last treatment received on Tuesday. Refused treatment on Thursday. Agreeable to treatment today. Next treatment scheduled for Saturday.   2. Left foot pain, primary team suspects claudication. Vascular surgety consulted for angiogram.   3. Anemia of chronic kidney disease Lab Results  Component Value Date   HGB 11.9 (L) 08/12/2024    Hgb within optimal range  4. Secondary Hyperparathyroidism: with outpatient labs: None  Lab Results  Component Value Date   PTH 1,236 (H) 04/15/2020   CALCIUM  9.7 08/12/2024   PHOS 9.3 (H) 10/16/2021    Will continue to monitor bone minerals.     LOS: 0 Mario [name] 1/9/20261:00 PM   "

## 2024-08-12 NOTE — Consult Note (Addendum)
 "                                                                ORTHOPAEDIC CONSULTATION  REQUESTING PHYSICIAN: Franchot Novel, MD  Chief Complaint:   Left hand pain/wound  History of Present Illness: Mario [name] is a 54 y.o. adult with a past medical history of ESRD on dialysis, hypertension, CHF who presented to the emergency department yesterday with complaints of left foot pain.  The foot pain had been going for over a week without known cause and it had become increasingly swollen and erythemic.  There is concern for possible ischemic changes to the foot and he has been admitted for further workup of this.  Per the primary team, the plan is for vascular surgery to do an angiogram today.  He also complains of a wound over the dorsal aspect of his middle finger MCP joint.  This has been present over 1 year.  He says that he got a thorn stuck over the dorsal aspect of the level of the middle finger MCP joint.  Since then, he has had pain, erythema, increasing difficulty moving the joint, and intermittent purulent drainage.  He complains of circumferential numbness involving the dorsal and volar aspects of the middle finger.  He has been afebrile, nontachycardic, and hemodynamically stable.  Blood work shows a white blood cell count of 5.3, hemoglobin 12.5, CRP of 3.4, ESR of 18, lactic acid of 0.9.  Past Medical History:  Diagnosis Date   Abdominal pain 08/28/2016   Acute hyperkalemia 10/28/2016   Chest pain 04/11/2016   Chronic combined systolic and diastolic CHF (congestive heart failure) (HCC)    Cirrhosis (HCC)    Dialysis patient    Edentulous    ESRD (end stage renal disease) on dialysis (HCC) 01/25/2015   Fluid overload 04/12/2016   HTN (hypertension) 10/13/2016   Hyperkalemia 11/27/2016   Hypertension    Mitral regurgitation    Pulmonary edema 07/03/2016   Renal disorder    Renal insufficiency    Scrotal infection 01/25/2015   Past Surgical History:   Procedure Laterality Date   ANTERIOR CERVICAL CORPECTOMY N/A 04/05/2019   Procedure: ANTERIOR CERVICAL CORPECTOMY Cervical six and Cervical seven with strut graft fusion from Cervical five-Thoracic one   ;  Surgeon: Louis Shove, MD;  Location: [facility] OR;  Service: Neurosurgery;  Laterality: N/A;   AV FISTULA PLACEMENT     POSTERIOR CERVICAL FUSION/FORAMINOTOMY N/A 04/05/2019   Procedure: Posterior cervical fusion utilizing segmental instrumentation from Cervical five-Thoracic one ;  Surgeon: Louis Shove, MD;  Location[facility] OR;  Service: Neurosurgery;  Laterality: N/A;   Social History   Socioeconomic History   Marital status: Single    Spouse name: Not on file   Number of children: Not on file   Years of education: Not on file   Highest education level: Not on file  Occupational History   Not on file  Tobacco Use   Smoking status: Every Day    Current packs/day: 0.25    Average packs/day: 0.3 packs/day for 25.0 years (6.3 ttl pk-yrs)    Types: Cigarettes   Smokeless tobacco: Never  Substance and Sexual Activity   Alcohol use: No    Comment: pt denies   Drug use: No  Sexual activity: Not Currently  Other Topics Concern   Not on file  Social History Narrative   Not on file   Social Drivers of Health   Tobacco Use: High Risk (08/11/2024)   Patient History    Smoking Tobacco Use: Every Day    Smokeless Tobacco Use: Never    Passive Exposure: Not on file  Financial Resource Strain: Not on file  Food Insecurity: Food Insecurity Present (08/11/2024)   Epic    Worried About Programme Researcher, Broadcasting/film/video in the Last Year: Often true    [employer] of Food in the Last Year: Often true  Transportation Needs: Unmet Transportation Needs (08/11/2024)   Epic    Lack of Transportation (Medical): Yes    Lack of Transportation (Non-Medical): Yes  Physical Activity: Not on file  Stress: Not on file  Social Connections: Moderately Isolated (08/11/2024)   Social Connection and Isolation Panel    Frequency of  Communication with Friends and Family: Never    Frequency of Social Gatherings with Friends and Family: Never    Attends Religious Services: 1 to 4 times per year    Active Member of [employer] or Organizations: Yes    Attends Banker Meetings: More than 4 times per year    Marital Status: Never married  Depression (PHQ2-9): Not on file  Alcohol Screen: Not on file  Housing: High Risk (08/11/2024)   Epic    Unable to Pay for Housing in the Last Year: Yes    Number of Times Moved in the Last Year: 0    Homeless in the Last Year: No  Utilities: At Risk (08/11/2024)   Epic    Threatened with loss of utilities: Yes  Health Literacy: Not on file   Family History  Problem Relation Age of Onset   Kidney failure Father    Diabetes Mother    Allergies[1] Prior to Admission medications  Medication Sig Start Date End Date Taking? Authorizing Provider  amLODipine  (NORVASC ) 5 MG tablet Take 1 tablet (5 mg total) by mouth daily. 10/18/21 02/03/22  Krishnan, Gokul, MD  amoxicillin -clavulanate (AUGMENTIN ) 875-125 MG tablet Take 1 tablet by mouth 2 (two) times daily. Patient not taking: Reported on 08/11/2024 10/23/22   Gasper Devere ORN, PA-C  ferric citrate  (AURYXIA ) 1 GM 210 MG(Fe) tablet Take 420 mg by mouth 3 (three) times daily with meals. Patient not taking: Reported on 08/11/2024    [provider]  prednisoLONE  acetate (PRED FORTE ) 1 % ophthalmic suspension Place 1-2 drops into the right eye every 4 (four) hours as needed. Patient not taking: Reported on 08/11/2024 01/16/22   [provider]   Recent Labs    08/11/24 0726 08/12/24 0600  WBC 5.3  --   HGB 12.5*  --   HCT 39.0  --   PLT 105*  --   K 4.5 5.4*  CL 94* 96*  CO2 25 22  BUN 40* 46*  CREATININE 7.12* 8.16*  GLUCOSE 73 72  CALCIUM  9.8 9.7   DG Hand Complete Left Result Date: 08/11/2024 EXAM: 3 OR MORE VIEW(S) XRAY OF THE LEFT HAND 08/11/2024 05:18:00 PM COMPARISON: None available. CLINICAL HISTORY: Foreign  body (FB) in soft tissue. FINDINGS: BONES AND JOINTS: No acute fracture. Subluxation of the third metacarpophalangeal joint. Joint space narrowing of the third metacarpophalangeal joint, with erosive changes at the third metacarpal head and base of the third proximal phalanx, consistent with sequela of inflammatory or infectious arthropathy. SOFT TISSUES: Diffuse vascular calcifications.  Diffuse soft tissue swelling of the third digit. No definitive radiopaque foreign body identified. IMPRESSION: 1. Diffuse soft tissue swelling of the third digit without a definite radiopaque foreign body. 2. Third metacarpophalangeal joint subluxation with erosive changes, most consistent with inflammatory or infectious arthropathy. 3. Diffuse vascular calcifications. Electronically signed by: Greig Pique MD MD 08/11/2024 08:06 PM EST RP Workstation: [id number]   MR HAND LEFT WO CONTRAST Result Date: 08/11/2024 EXAM: MR LEFT HAND WITHOUT INTRAVENOUS CONTRAST 08/11/2024 03:16:04 PM TECHNIQUE: Multiplanar magnetic resonance images of the left hand without intravenous contrast. COMPARISON: None available. CLINICAL HISTORY: Nonhealing wound along the dorsal middle finger MCP joint, reduced range of motion of the middle finger. FINDINGS: LIMITATIONS/ARTIFACTS: Motion artifact is present, reducing diagnostic sensitivity and specificity. SOFT TISSUES: The dorsal soft tissue ulceration along the middle finger MCP joint region is shown on image 25 series 4. Low grade subcutaneous edema in the interosseous muscle adjacent to the distal middle finger metacarpal. JOINTS: Possible erosion of the dorsal base of the proximal phalanx with anterior subluxation of the proximal phalanx with respect to the metacarpal head. BONES: Underlying reported ulceration along the middle finger MCP joint, there is abnormal marrow edema in the base of the proximal phalanx and in the head of the middle finger metacarpal suspicious for active osteomyelitis.  IMPRESSION: 1. Abnormal marrow edema in the base of the proximal phalanx and in the head of the middle finger metacarpal, suspicious for active osteomyelitis, with overlying dorsal ulceration. 2. Erosion of the dorsal base of the proximal phalanx middle finger with anterior subluxation of the proximal phalanx with respect to the metacarpal head. 3. Low grade adjacent interosseous muscle and subcutaneous edema. Adjacent to the distal 3rd digit metacarpal. 4. Motion artifact reduces diagnostic sensitivity and specificity. Electronically signed by: Ryan Salvage MD MD 08/11/2024 03:47 PM EST RP Workstation: [id number]   MR FOOT LEFT WO CONTRAST Result Date: 08/11/2024 EXAM: MRI of the left Foot without contrast. 08/11/2024 03:00:40 PM TECHNIQUE: Multiplanar multisequence MRI of the left foot was performed without the administration of intravenous contrast. COMPARISON: 08/12/2023. CLINICAL HISTORY: left foot pain for 7 days with clinical suspicion for osteomyelitis. FINDINGS: LIMITATIONS/ARTIFACTS: Motion artifact is present, reducing diagnostic sensitivity and specificity. LISFRANC JOINT: Visualized Lisfranc ligament is intact. No significant Lisfranc interval widening or significant periligamentous edema. BONE MARROW: Small lateral erosion of the head of the 5th metatarsal laterally with adjacent marrow edema for example on image 14 series 8 and image 21 series 5, with thinning of the overlying superficial soft tissues in this vicinity raising the possibility of ulceration. Although the appearance is not classic for osteomyelitis, the presence of the immediately adjacent cutaneous defect in the vicinity of the osseous erosion does raise the possibility of early osteomyelitis in the head of the 5th metatarsal. No acute fracture or aggressive marrow replacing lesion. GREATER AND LESSER MTP JOINTS: Mild degenerative arthropathy at the 1st metatarsophalangeal joint. The 5th metatarsophalangeal joint demonstrates a  small lateral erosion of the head of the 5th metatarsal laterally with adjacent marrow edema, as described in the Bone Marrow section. Normal alignment. SOFT TISSUES: Thinning of the overlying superficial soft tissues in the vicinity of the 5th metatarsal head, raising the possibility of ulceration. Trace subcutaneous edema along the dorsum of the forefoot, cellulitis not excluded. TENDONS: Visualized flexor and extensor tendons are intact. IMPRESSION: 1. Small lateral erosion of the head of the 5th metatarsal with adjacent marrow edema and overlying soft tissue thinning/possible ulceration, raising concern for early osteomyelitis. 2. Trace  subcutaneous edema along the dorsum of the forefoot, which can be seen with cellulitis. 3. Mild degenerative arthropathy at the 1st metatarsophalangeal joint. Electronically signed by: Ryan Salvage MD MD 08/11/2024 03:40 PM EST RP Workstation: [id number]   CT Angio Aortobifemoral W and/or Wo Contrast Result Date: 08/11/2024 CLINICAL DATA:  Ischemic left foot. End-stage renal disease on hemodialysis. EXAM: CT ANGIOGRAPHY OF ABDOMINAL AORTA WITH ILIOFEMORAL RUNOFF TECHNIQUE: Multidetector CT imaging of the abdomen, pelvis and lower extremities was performed using the standard protocol during bolus administration of intravenous contrast. Multiplanar CT image reconstructions and MIPs were obtained to evaluate the vascular anatomy. RADIATION DOSE REDUCTION: This exam was performed according to the departmental dose-optimization program which includes automated exposure control, adjustment of the mA and/or kV according to patient size and/or use of iterative reconstruction technique. CONTRAST:  OMNIPAQUE  IOHEXOL  350 MG/ML SOLN COMPARISON:  Prior CT abdomen/pelvis 02/03/2022 FINDINGS: VASCULAR Aorta: Normal caliber aorta without aneurysm, dissection, vasculitis or significant stenosis. Celiac: Patent without evidence of aneurysm, dissection, vasculitis or significant  stenosis. SMA: Patent without evidence of aneurysm, dissection, vasculitis or significant stenosis. Renals: Hypoplastic renal arteries due to longstanding chronic renal failure and renal atrophy. IMA: Moderate to high-grade stenosis of the proximal IMA. The vessel remains patent. RIGHT Lower Extremity Inflow: Common, internal and external iliac arteries are patent without evidence of aneurysm, dissection, vasculitis or significant stenosis. Outflow: Common, superficial and profunda femoral arteries and the popliteal artery are patent without evidence of aneurysm, dissection, vasculitis or significant stenosis. Runoff: Limited evaluation of the runoff arteries due to extensive medial arteriosclerosis. The circumferential calcium  in the small vessels limits evaluation of the arterial lumen. LEFT Lower Extremity Inflow: Common, internal and external iliac arteries are patent without evidence of aneurysm, dissection, vasculitis or significant stenosis. Outflow: Common, superficial and profunda femoral arteries and the popliteal artery are patent without evidence of aneurysm, dissection, vasculitis or significant stenosis. Runoff: Limited evaluation of the runoff arteries due to extensive medial arteriosclerosis. The circumferential calcium  in the small vessels limits evaluation of the arterial lumen. Veins: Limited evaluation given arterial phase timing of the exam. There is peripheral calcification of the inferior vena cava and bilateral iliac veins which may represent chronic calcinosis, or potentially sequelae of prior ileo caval DVT. Changes are stable over many years. Review of the MIP images confirms the above findings. NON-VASCULAR Lower chest: No acute abnormality. Mild cardiomegaly. Calcifications along the coronary arteries. Large para esophageal varices. Hepatobiliary: Advanced hepatic cirrhosis. No arterially enhancing lesion to suggest the presence of hepatocellular carcinoma. Gallbladder is unremarkable.  No intra or extrahepatic biliary ductal dilatation. Pancreas: Unremarkable. No pancreatic ductal dilatation or surrounding inflammatory changes. Spleen: Mild splenomegaly. Adrenals/Urinary Tract: Unremarkable adrenal glands. Atrophied native kidneys. The bladder is decompressed. Stomach/Bowel: No focal bowel wall thickening or evidence of obstruction. Lymphatic: No suspicious lymphadenopathy. Reproductive: Unremarkable prostate gland. Other: Moderate ascites. Musculoskeletal: No acute fracture or aggressive appearing lytic or blastic osseous lesion. IMPRESSION: VASCULAR 1. No evidence of hemodynamically significant stenosis or occlusive disease in the inflow (aortoiliac) or outflow (femoropopliteal) segments. Evaluation of the runoff (infrageniculate) arteries is limited by severe medial arteriosclerosis. The circumferential calcification of the medium and small artery walls creates streak artifact which severely limits evaluation of the internal lumens. Catheter directed angiography could be considered if clinically warranted. 2. Extensive peripheral calcified atherosclerotic plaque. 3. Extensive peripheral calcification of the inferior vena cava and bilateral iliac vessels, similar compared to prior. 4. No evidence of arterial aneurysm or dissection. NON-VASCULAR 1. Severe hepatic  cirrhosis with splenomegaly, esophageal and paraesophageal varices and moderate ascites. 2. No acute abnormality within the abdomen or pelvis. 3. Similar appearance of atrophic native kidneys. Electronically Signed   By: Wilkie Lent M.D.   On: 08/11/2024 10:25   US  Venous Img Lower Unilateral Left Result Date: 08/11/2024 EXAM: ULTRASOUND DUPLEX OF THE LEFT LOWER EXTREMITY VEINS TECHNIQUE: Duplex ultrasound using B-mode/gray scaled imaging and Doppler spectral analysis and color flow was obtained of the deep venous structures of the left lower extremity. COMPARISON: None available. CLINICAL HISTORY: 54 year old male with lower  extremity pain and color changes. FINDINGS: The common femoral vein, femoral vein, popliteal vein, and visible calf veins demonstrate normal compressibility with normal color flow and spectral analysis. Evidence of calcified peripheral vascular disease. The right contralateral common femoral vein appears patent and unremarkable. IMPRESSION: 1. No evidence of left lower extremity DVT. 2. Suspect  calcified peripheral vascular disease in the left lower extremity. Electronically signed by: Helayne Hurst MD MD 08/11/2024 09:35 AM EST RP Workstation: [id number]   DG Foot 2 Views Left Result Date: 08/11/2024 EXAM: 2 VIEW(S) XRAY OF THE LEFT FOOT 08/11/2024 07:32:00 AM COMPARISON: CT left foot 01/22/2018. CLINICAL HISTORY: pain pain FINDINGS: BONES AND JOINTS: Osteopenia. There is a chronic healed fracture deformity of the neck of the 5th metatarsal. No acute fracture. No findings of acute osteomyelitis. No malalignment. Arthritic changes are not seen. SOFT TISSUES: There are extensive vascular calcifications of the distal foreleg and foot. Mild generalized edema. There is mild fusiform thickening in the distal achilles tendon. There is no visible soft tissue gas or foreign body. IMPRESSION: 1. No acute osseous abnormality. 2. Extensive vascular calcifications in the distal foreleg and foot. 3. Soft tissue swelling. 4. Osteopenia. Electronically signed by: Francis Quam MD 08/11/2024 07:40 AM EST RP Workstation: [id number]   Positive ROS: All other systems have been reviewed and were otherwise negative with the exception of those mentioned in the HPI and as above.  Physical Exam: BP (!) 144/73 (BP Location: Right Arm)   Pulse (!) 58   Temp 97.6 F (36.4 C)   Resp 18   Ht 5' 2.99 (1.6 m)   Wt 74.8 kg   SpO2 97%   BMI 29.24 kg/m  General:  Alert, no acute distress Psychiatric:  Patient is competent for consent with normal mood and affect     Orthopedic Exam:  Left upper extremity: Wound over the dorsal  aspect of the metacarpal phalangeal joint.  There is skin ulceration/compromise and eschar with a small sinus.  The wound is over top of the whole of the MCP joint.  His sensation to light touch is decreased over the dorsal and volar aspect of the middle finger.  He is able to independently flex the FDP and FDS of the middle finger.  No significant tenderness to palpation over the flexor aspect of the digit.  No significant pain with passive extension of the digit.  No significant swelling to the digit and it is not held in a flexed position.  There is pain to palpation about the MCP joint and the wound.  There is moderate to severe pain with passive range of motion of the the joint but tolerable.  He has very limited active range of motion of the joint with no ability to extend and minimal flexion.  He is unable to make a full composite fist.  I am unable to express any drainage or purulence from the wound.  He is not able to demonstrate  active extension of the middle finger.  The digit is warm and well-perfused.  Imaging:  Radiographs of the left hand were reviewed and personally interpreted.  There is evidence of chronic erosive changes of the third MCP joint as well as soft tissue swelling.the head of the third metacarpal is substantially eroded as well as the base of the proximal phalanx.  There appears to be volar subluxation of the proximal phalanx relative to the metacarpal  The MRI of the left hand shows increased signaling at the base of the proximal phalanx and at the head of the metacarpal of the middle finger MCP joint.  There also appears to be some increase signal about the soft tissue.  There does not appear to be a definitive fluid collection or abscess.  Assessment/Plan: Left hand middle finger metacarpal phalangeal joint chronic septic arthritis/osteomyelitis with overlying ulceration; also concern for possible EDC tendon to the middle finger compromise.  He is not currently septic or  exhibiting systemic signs of illness from this infection. - Given the extent of the soft tissue damage/ulceration as well as the severely compromised state of the joint with osteomyelitis, the patient would need a significant debridement including debridement of the skin and surrounding soft tissue.  Such a debridement would certainly leave the patient with a large open wound over the dorsal aspect of the hand with exposed bone and tendon, requiring plastic surgery services for definitive coverage - I would recommend transfer to the hospital with a higher level of care/plastic surgery availability, as he would ultimately benefit from their services - I have discussed this with the primary team who is in agreement and will initiate transfer - I am available if there are any issues with the transfer or extensive delays - While in the hospital, he would likely benefit from occupational therapy services to work on range of motion of the hand - While in the hospital here, he would also likely benefit from local wound care consultation  Spanish interpreter Beulah [phone] used during encounter  Mario CANDIE Barrack, MD [facility]   ADDENDUM: After discussion with the hand surgeon on-call at [facility], he agrees that the patient may benefit from plastic surgery involvement for possible soft tissue coverage.  However, he recommends debridement and irrigation prior to transfer.  I am happy to do this, however, the patient is currently in hemodialysis and then is going with vascular surgery for an angiogram.  The debridement is not urgent given the chronicity of the infection and already severe damage to the joint; additionally, the patient is not septic or systemically ill from this infection.  I still think that, ideally, he receives all of his care, including the initial I&D at a single facility to improve communication and continuity of care.  However, I will be happy to perform the  debridement early next week.  The primary team is going to reach out to other hospitals to see if they will accept the transfer.  Otherwise, we will plan for debridement early next week with transfer to [name] for definitive plastic surgery assistance.     [1]  Allergies Allergen Reactions   Betadine [Povidone Iodine ] Rash   Povidone-Iodine  Dermatitis   "

## 2024-08-13 ENCOUNTER — Inpatient Hospital Stay: Payer: Self-pay

## 2024-08-13 DIAGNOSIS — Z9889 Other specified postprocedural states: Secondary | ICD-10-CM

## 2024-08-13 DIAGNOSIS — Z9862 Peripheral vascular angioplasty status: Secondary | ICD-10-CM

## 2024-08-13 DIAGNOSIS — S91302A Unspecified open wound, left foot, initial encounter: Secondary | ICD-10-CM

## 2024-08-13 LAB — CBC WITH DIFFERENTIAL/PLATELET
Abs Immature Granulocytes: 0.01 K/uL (ref 0.00–0.07)
Basophils Absolute: 0.1 K/uL (ref 0.0–0.1)
Basophils Relative: 1 %
Eosinophils Absolute: 0.2 K/uL (ref 0.0–0.5)
Eosinophils Relative: 4 %
HCT: 36.1 % — ABNORMAL LOW (ref 39.0–52.0)
Hemoglobin: 11.6 g/dL — ABNORMAL LOW (ref 13.0–17.0)
Immature Granulocytes: 0 %
Lymphocytes Relative: 11 %
Lymphs Abs: 0.5 K/uL — ABNORMAL LOW (ref 0.7–4.0)
MCH: 33.7 pg (ref 26.0–34.0)
MCHC: 32.1 g/dL (ref 30.0–36.0)
MCV: 104.9 fL — ABNORMAL HIGH (ref 80.0–100.0)
Monocytes Absolute: 0.6 K/uL (ref 0.1–1.0)
Monocytes Relative: 14 %
Neutro Abs: 3 K/uL (ref 1.7–7.7)
Neutrophils Relative %: 70 %
Platelets: 91 K/uL — ABNORMAL LOW (ref 150–400)
RBC: 3.44 MIL/uL — ABNORMAL LOW (ref 4.22–5.81)
RDW: 15.2 % (ref 11.5–15.5)
WBC: 4.3 K/uL (ref 4.0–10.5)
nRBC: 0 % (ref 0.0–0.2)

## 2024-08-13 LAB — BASIC METABOLIC PANEL WITH GFR
Anion gap: 15 (ref 5–15)
BUN: 36 mg/dL — ABNORMAL HIGH (ref 6–20)
CO2: 25 mmol/L (ref 22–32)
Calcium: 9.5 mg/dL (ref 8.9–10.3)
Chloride: 94 mmol/L — ABNORMAL LOW (ref 98–111)
Creatinine, Ser: 6.57 mg/dL — ABNORMAL HIGH (ref 0.61–1.24)
GFR, Estimated: 9 mL/min — ABNORMAL LOW
Glucose, Bld: 80 mg/dL (ref 70–99)
Potassium: 4.9 mmol/L (ref 3.5–5.1)
Sodium: 133 mmol/L — ABNORMAL LOW (ref 135–145)

## 2024-08-13 LAB — HEPATITIS B SURFACE ANTIBODY, QUANTITATIVE: Hep B S AB Quant (Post): 22611 m[IU]/mL

## 2024-08-13 LAB — PROTIME-INR
INR: 1.3 — ABNORMAL HIGH (ref 0.8–1.2)
Prothrombin Time: 16.7 s — ABNORMAL HIGH (ref 11.4–15.2)

## 2024-08-13 LAB — PHOSPHORUS: Phosphorus: 6.2 mg/dL — ABNORMAL HIGH (ref 2.5–4.6)

## 2024-08-13 MED ORDER — HYDROMORPHONE HCL 1 MG/ML IJ SOLN
0.5000 mg | INTRAMUSCULAR | Status: DC | PRN
  Administered 2024-08-13 – 2024-08-14 (×7): 1 mg via INTRAVENOUS
  Filled 2024-08-13 (×6): qty 1

## 2024-08-13 MED ORDER — LACTULOSE 10 GM/15ML PO SOLN
20.0000 g | Freq: Three times a day (TID) | ORAL | Status: DC
  Administered 2024-08-13 – 2024-08-14 (×5): 20 g via ORAL
  Filled 2024-08-13 (×3): qty 30

## 2024-08-13 MED ORDER — HYDROMORPHONE HCL 1 MG/ML IJ SOLN
INTRAMUSCULAR | Status: AC
  Filled 2024-08-13: qty 1

## 2024-08-13 MED ORDER — HYDROMORPHONE HCL 2 MG PO TABS: 1.0000 mg | ORAL_TABLET | ORAL | Status: DC | PRN

## 2024-08-13 MED ORDER — PENTAFLUOROPROP-TETRAFLUOROETH EX AERO: 1.0000 | INHALATION_SPRAY | CUTANEOUS | Status: DC | PRN

## 2024-08-13 MED ORDER — LIDOCAINE HCL (PF) 1 % IJ SOLN: 5.0000 mL | INTRAMUSCULAR | Status: DC | PRN

## 2024-08-13 MED ORDER — LIDOCAINE-PRILOCAINE 2.5-2.5 % EX CREA: 1.0000 | TOPICAL_CREAM | CUTANEOUS | Status: DC | PRN

## 2024-08-13 MED ORDER — HYDROMORPHONE HCL 2 MG PO TABS
2.0000 mg | ORAL_TABLET | ORAL | Status: DC | PRN
  Administered 2024-08-14 – 2024-08-15 (×4): 2 mg via ORAL
  Filled 2024-08-13 (×5): qty 1

## 2024-08-13 NOTE — Progress Notes (Signed)
" °   08/13/24 1608  Vitals  Temp 98.5 F (36.9 C)  Temp Source Oral  BP (!) 146/62  BP Location Left Arm  BP Method Automatic  Patient Position (if appropriate) Lying  Pulse Rate (!) 49  Pulse Rate Source Monitor  Resp 16  Weight 62.2 kg  Type of Weight Post-Dialysis  Oxygen Therapy  SpO2 100 %  O2 Device Room Air  Patient Activity (if Appropriate) In bed  Pulse Oximetry Type Continuous  During Treatment Monitoring  Blood Flow Rate (mL/min) 0 mL/min  Arterial Pressure (mmHg) -3.03 mmHg  Venous Pressure (mmHg) -1.61 mmHg  TMP (mmHg) -51.31 mmHg  Ultrafiltration Rate (mL/min) 829 mL/min  Dialysate Flow Rate (mL/min) 0 ml/min  Duration of HD Treatment -hour(s) 3 hour(s)  Cumulative Fluid Removed (mL) per Treatment  2000.1  Post Treatment  Dialyzer Clearance Lightly streaked  Hemodialysis Intake (mL) 0 mL  Liters Processed 72  Fluid Removed (mL) 2000 mL  Tolerated HD Treatment Yes  Post-Hemodialysis Comments tx complete (Goal has been met. Tx has been tolerated. Vs have remained stable. Access is patent. Thrill and bruit +. Patient received pain medication and is now sleeping. No prolonged bleeding. Stable to return to assigned unit)  AVG/AVF Arterial Site Held (minutes) 0 minutes  AVG/AVF Venous Site Held (minutes) 0 minutes  Fistula / Graft Left Upper arm Arteriovenous fistula  No Placement Date or Time found.   Orientation: Left  Access Location: Upper arm  Access Type: Arteriovenous fistula  Site Condition No complications  Fistula / Graft Assessment Present;Thrill;Bruit  Status Deaccessed  Needle Size Hemostasis achieved  Drainage Description None    "

## 2024-08-13 NOTE — Progress Notes (Signed)
 1 Day Post-Op   Subjective/Chief Complaint: Complains of LEFT foot pain   Objective: Vital signs in last 24 hours: Temp:  [97.7 F (36.5 C)-99.1 F (37.3 C)] 99.1 F (37.3 C) (01/10 0845) Pulse Rate:  [57-91] 77 (01/10 0845) Resp:  [10-19] 18 (01/10 0845) BP: (80-196)/(27-112) 196/87 (01/10 0845) SpO2:  [93 %-100 %] 100 % (01/10 0845) Weight:  [66.8 kg-67 kg] 66.8 kg (01/09 1335) Last BM Date : 08/11/24  Intake/Output from previous day: 01/09 0701 - 01/10 0700 In: 399.3 [P.O.:240; I.V.:49.3; IV Piggyback:110] Out: 200  Intake/Output this shift: No intake/output data recorded.  General appearance: alert and no distress Extremities: RIGHT groin access site- soft, no hematoma. LEFT foot warm, erythema, edema, faint DP/PT  Lab Results:  Recent Labs    08/12/24 1100 08/13/24 0636  WBC 4.7 4.3  HGB 11.9* 11.6*  HCT 36.6* 36.1*  PLT 102* 91*   BMET Recent Labs    08/12/24 0600 08/13/24 0636  NA 134* 133*  K 5.4* 4.9  CL 96* 94*  CO2 22 25  GLUCOSE 72 80  BUN 46* 36*  CREATININE 8.16* 6.57*  CALCIUM  9.7 9.5   PT/INR Recent Labs    08/13/24 0937  LABPROT 16.7*  INR 1.3*   ABG No results for input(s): PHART, HCO3 in the last 72 hours.  Invalid input(s): PCO2, PO2  Studies/Results: PERIPHERAL VASCULAR CATHETERIZATION Result Date: 08/12/2024 See surgical note for result.  DG Hand Complete Left Result Date: 08/11/2024 EXAM: 3 OR MORE VIEW(S) XRAY OF THE LEFT HAND 08/11/2024 05:18:00 PM COMPARISON: None available. CLINICAL HISTORY: Foreign body (FB) in soft tissue. FINDINGS: BONES AND JOINTS: No acute fracture. Subluxation of the third metacarpophalangeal joint. Joint space narrowing of the third metacarpophalangeal joint, with erosive changes at the third metacarpal head and base of the third proximal phalanx, consistent with sequela of inflammatory or infectious arthropathy. SOFT TISSUES: Diffuse vascular calcifications. Diffuse soft tissue swelling of  the third digit. No definitive radiopaque foreign body identified. IMPRESSION: 1. Diffuse soft tissue swelling of the third digit without a definite radiopaque foreign body. 2. Third metacarpophalangeal joint subluxation with erosive changes, most consistent with inflammatory or infectious arthropathy. 3. Diffuse vascular calcifications. Electronically signed by: Greig Pique MD MD 08/11/2024 08:06 PM EST RP Workstation: [id number]   MR HAND LEFT WO CONTRAST Result Date: 08/11/2024 EXAM: MR LEFT HAND WITHOUT INTRAVENOUS CONTRAST 08/11/2024 03:16:04 PM TECHNIQUE: Multiplanar magnetic resonance images of the left hand without intravenous contrast. COMPARISON: None available. CLINICAL HISTORY: Nonhealing wound along the dorsal middle finger MCP joint, reduced range of motion of the middle finger. FINDINGS: LIMITATIONS/ARTIFACTS: Motion artifact is present, reducing diagnostic sensitivity and specificity. SOFT TISSUES: The dorsal soft tissue ulceration along the middle finger MCP joint region is shown on image 25 series 4. Low grade subcutaneous edema in the interosseous muscle adjacent to the distal middle finger metacarpal. JOINTS: Possible erosion of the dorsal base of the proximal phalanx with anterior subluxation of the proximal phalanx with respect to the metacarpal head. BONES: Underlying reported ulceration along the middle finger MCP joint, there is abnormal marrow edema in the base of the proximal phalanx and in the head of the middle finger metacarpal suspicious for active osteomyelitis. IMPRESSION: 1. Abnormal marrow edema in the base of the proximal phalanx and in the head of the middle finger metacarpal, suspicious for active osteomyelitis, with overlying dorsal ulceration. 2. Erosion of the dorsal base of the proximal phalanx middle finger with anterior subluxation of the proximal phalanx with  respect to the metacarpal head. 3. Low grade adjacent interosseous muscle and subcutaneous edema. Adjacent to  the distal 3rd digit metacarpal. 4. Motion artifact reduces diagnostic sensitivity and specificity. Electronically signed by: Ryan Salvage MD MD 08/11/2024 03:47 PM EST RP Workstation: [id number]   MR FOOT LEFT WO CONTRAST Result Date: 08/11/2024 EXAM: MRI of the left Foot without contrast. 08/11/2024 03:00:40 PM TECHNIQUE: Multiplanar multisequence MRI of the left foot was performed without the administration of intravenous contrast. COMPARISON: 08/12/2023. CLINICAL HISTORY: left foot pain for 7 days with clinical suspicion for osteomyelitis. FINDINGS: LIMITATIONS/ARTIFACTS: Motion artifact is present, reducing diagnostic sensitivity and specificity. LISFRANC JOINT: Visualized Lisfranc ligament is intact. No significant Lisfranc interval widening or significant periligamentous edema. BONE MARROW: Small lateral erosion of the head of the 5th metatarsal laterally with adjacent marrow edema for example on image 14 series 8 and image 21 series 5, with thinning of the overlying superficial soft tissues in this vicinity raising the possibility of ulceration. Although the appearance is not classic for osteomyelitis, the presence of the immediately adjacent cutaneous defect in the vicinity of the osseous erosion does raise the possibility of early osteomyelitis in the head of the 5th metatarsal. No acute fracture or aggressive marrow replacing lesion. GREATER AND LESSER MTP JOINTS: Mild degenerative arthropathy at the 1st metatarsophalangeal joint. The 5th metatarsophalangeal joint demonstrates a small lateral erosion of the head of the 5th metatarsal laterally with adjacent marrow edema, as described in the Bone Marrow section. Normal alignment. SOFT TISSUES: Thinning of the overlying superficial soft tissues in the vicinity of the 5th metatarsal head, raising the possibility of ulceration. Trace subcutaneous edema along the dorsum of the forefoot, cellulitis not excluded. TENDONS: Visualized flexor and extensor  tendons are intact. IMPRESSION: 1. Small lateral erosion of the head of the 5th metatarsal with adjacent marrow edema and overlying soft tissue thinning/possible ulceration, raising concern for early osteomyelitis. 2. Trace subcutaneous edema along the dorsum of the forefoot, which can be seen with cellulitis. 3. Mild degenerative arthropathy at the 1st metatarsophalangeal joint. Electronically signed by: Ryan Salvage MD MD 08/11/2024 03:40 PM EST RP Workstation: [id number]    Anti-infectives: Anti-infectives (From admission, onward)    Start     Dose/Rate Route Frequency Ordered Stop   08/12/24 1430  ceFAZolin  (ANCEF ) IVPB 1 g/50 mL premix        1 g 100 mL/hr over 30 Minutes Intravenous  Once 08/12/24 1342 08/12/24 1624   08/11/24 0800  cefTRIAXone  (ROCEPHIN ) 1 g in sodium chloride  0.9 % 100 mL IVPB        1 g 200 mL/hr over 30 Minutes Intravenous  Once 08/11/24 9247 08/11/24 0831       Assessment/Plan: s/p Procedures: Lower Extremity Angiography (Left) S/P Angioplasty- TP trunk, AT and PT Continue supportive care Local wound care to LEFT foot/ABX ASA/Plavix /Statin No further Vascular Surgery intervention recommended at this time  LOS: 1 day    Tisa Dakin A 08/13/2024

## 2024-08-13 NOTE — Plan of Care (Signed)

## 2024-08-13 NOTE — Progress Notes (Signed)
 " Progress Note   Patient: Mario [name] FMW:[id number] DOB: 1970/09/12 DOA: 08/11/2024     1 DOS: the patient was seen and examined on 08/13/2024   Brief hospital course: Per H&P HPI  Mario [name] is a 54 y.o. adult with medical history significant of ESRD and HD TTS, HTN HLD, presented with worsening of left foot pain and nonhealing wound of left hand.   Patient complains about having left foot pain for 7+ days, quality getting worse, last 2 days even putting weight on left foot can trigger significant pain.  He pointed the pain mainly to the 5 toes especially the 1st and 5th toes, he has a small ulcer on lateral side of the pinky toe.  Denies any history of claudications.  Meantime he also complained about a unhealing wound of left hand, located on the extension side of the MCP joint, this has been a more chronic issue and he reported that he has not been able to bend left middle finger for several months.  Denied any fever or chills.  Last dialysis was last Tuesday.   ED Course: Afebrile, nontachycardic blood pressure 164/73 O2 saturation 100% on room air.  CTA of abdomen pelvis bifurcation lower extremity showed no significant occlusion or stenosis of major vessels but heavy calcification of medium and small vessels.  Blood work showed WBC 5.3 hemoglobin 12.5 BUN 40 creatinine 7.1 K4.5 bicarb 25.   Vascular surgery consulted.  Assessment and Plan: Rest ischemic pain  Continue DAPT. Further recommendations per [facility] after angiogram. Per podiatry no surgical needs from their standpoint at this time. Continue statin.  Pain control   ESRD on HD TThS Continue HD per nephrology   Severe hepatic cirrhosis  Coagulopathy with elevated INR, thrombocytopenia, noted to have esophageal and paraesophageal varices as well as splenomegaly.  Patient with large volume ascites on ultrasound.  Patient states that he does not drink alcohol.  Hepatitis B is negative. -Check hepatitis  C -will consult IR for paracentesis   L hand non healing ulcer Likely chronic osteo/septic arthritis at MCP of L 3rd digit.  Given patient's social situation he would likely require I&D here will transfer to [facility] for plastics.  Will reach out to surrounding hospitals around the vascular surgery has no further planned interventions.   Hyper K resolved S/p HD   HTN Continue amlodipine    HLD Continue statin   Macrocytic anemia  Check iron  panel, b12, folate  Thrombocytopenia Appears chronic   Tobacco use disorder Will discuss cessation Nicotine  replacement therapy      Subjective: Continues to have significant pain in his left foot.  Physical Exam: Vitals:   08/12/24 1717 08/12/24 2241 08/13/24 0449 08/13/24 0845  BP: (!) 145/79 (!) 159/76 (!) 148/84 (!) 196/87  Pulse: 62 66 66 77  Resp: 16 18 18 18   Temp: 98.4 F (36.9 C) 98 F (36.7 C) 97.7 F (36.5 C) 99.1 F (37.3 C)  TempSrc:    Oral  SpO2: 100% 100% 100% 100%  Weight:      Height:         Physical Exam  Constitutional: In no distress. Thin, disheveled.  Cardiovascular: Normal rate, regular rhythm. No lower extremity edema  Pulmonary: Non labored breathing on room air, no wheezing or rales.   Abdominal: Soft. Non distended and non tender Musculoskeletal: L foot, lateral aspect with eschar, dorsum of foot erythematous, TT mild palpation.     Neurological: Alert and oriented to person, place, and time.  Non focal  Skin: Skin is warm and dry.    Data Reviewed: {Tip this will not be part of the note when signed- Document your independent interpretation of telemetry tracing, EKG, lab, Radiology test or any other diagnostic tests. Add any new diagnostic test ordered today. (Optional):[id number]     Latest Ref Rng & Units 08/13/2024    6:36 AM 08/12/2024    6:00 AM 08/11/2024    7:26 AM  BMP  Glucose 70 - 99 mg/dL 80  72  73   BUN 6 - 20 mg/dL 36  46  40   Creatinine 0.61 - 1.24 mg/dL 3.42  1.83  2.87    Sodium 135 - 145 mmol/L 133  134  135   Potassium 3.5 - 5.1 mmol/L 4.9  5.4  4.5   Chloride 98 - 111 mmol/L 94  96  94   CO2 22 - 32 mmol/L 25  22  25    Calcium  8.9 - 10.3 mg/dL 9.5  9.7  9.8       Latest Ref Rng & Units 08/13/2024    6:36 AM 08/12/2024   11:00 AM 08/11/2024    7:26 AM  CBC  WBC 4.0 - 10.5 K/uL 4.3  4.7  5.3   Hemoglobin 13.0 - 17.0 g/dL 88.3  88.0  87.4   Hematocrit 39.0 - 52.0 % 36.1  36.6  39.0   Platelets 150 - 400 K/uL 91  102  105      Family Communication: None available in the [place]   Disposition: Status is: Inpatient Remains inpatient appropriate because: management of foot pain   Planned Discharge Destination: Pending clinical course     Time spent: 35 minutes  Author: Alban Pepper, MD 08/13/2024 9:03 AM  For on call review [email or web address].  "

## 2024-08-13 NOTE — Progress Notes (Signed)
 " [facility]  ROUNDING NOTE   Subjective:   Mario [name] is a 54 y.o. male with past medical history including hypertension, neuropathy, chronic liver cirrhosis, portal hypertension, and end-stage renal disease on hemodialysis. Patient seen in ED for foot pain and swelling. He has been admitted for Osteomyelitis [facility]) [M86.9] Patient is known to our practice and receives outpatient dialysis treatments at [facility] on a TTS schedule, overseen by [employer]. Patient seen and evalauted at bedside and later in dialysis.   Hemodialysis dialysis treatment flowsheet  Blood flow rate (mL/min): 399 Arterial pressures (mmHg): -195.95 Venous pressures (mmHg): 192.72 TMP (mmHg): 9.49 Ultrafiltration rate (mL/min): 841 Dialysate (mL/min): 299   Objective:  Vital signs in last 24 hours:  Temp:  [97.7 F (36.5 C)-99.1 F (37.3 C)] 98.9 F (37.2 C) (01/10 1234) Pulse Rate:  [57-77] 61 (01/10 1500) Resp:  [9-18] 12 (01/10 1500) BP: (125-196)/(64-90) 140/70 (01/10 1430) SpO2:  [93 %-100 %] 100 % (01/10 1500)  Weight change:  Filed Weights   08/11/24 0648 08/12/24 1045 08/12/24 1335  Weight: 74.8 kg 67 kg 66.8 kg    Intake/Output: I/O last 3 completed shifts: In: 759.3 [P.O.:600; I.V.:49.3; IV Piggyback:110] Out: 200 [Other:200]   Intake/Output this shift:  Total I/O In: 480 [P.O.:480] Out: -   Physical Exam: General: Grimacing in pain  Head: Normocephalic  Eyes: Anicteric  Lungs:  normal effort, on room air  Heart: Regular rate an  Abdomen:  Soft, nontender  Extremities:  No peripheral edema.  Neurologic: Awake, alert  Skin: Warm,dry  Access: Lt AVF    Basic Metabolic Panel: Recent Labs  Lab 08/11/24 0726 08/12/24 0600 08/13/24 0636  NA 135 134* 133*  K 4.5 5.4* 4.9  CL 94* 96* 94*  CO2 25 22 25   GLUCOSE 73 72 80  BUN 40* 46* 36*  CREATININE 7.12* 8.16* 6.57*  CALCIUM  9.8 9.7 9.5    Liver Function Tests: Recent Labs   Lab 08/11/24 0726  AST 36  ALT 13  ALKPHOS 188*  BILITOT 0.8  PROT 7.6  ALBUMIN 4.0   No results for input(s): LIPASE, AMYLASE in the last 168 hours. No results for input(s): AMMONIA in the last 168 hours.  CBC: Recent Labs  Lab 08/11/24 0726 08/12/24 1100 08/13/24 0636  WBC 5.3 4.7 4.3  NEUTROABS 3.8  --  3.0  HGB 12.5* 11.9* 11.6*  HCT 39.0 36.6* 36.1*  MCV 104.0* 103.4* 104.9*  PLT 105* 102* 91*    Cardiac Enzymes: No results for input(s): CKTOTAL, CKMB, CKMBINDEX, TROPONINI in the last 168 hours.  BNP: Invalid input(s): POCBNP  CBG: No results for input(s): GLUCAP in the last 168 hours.  Microbiology: Results for orders placed or performed during the hospital encounter of 08/11/24  Blood culture (routine x 2)     Status: None (Preliminary result)   Collection Time: 08/11/24  7:15 AM   Specimen: BLOOD  Result Value Ref Range Status   Specimen Description BLOOD RIGHT ANTECUBITAL  Final   Special Requests   Final    BOTTLES DRAWN AEROBIC AND ANAEROBIC Blood Culture adequate volume   Culture   Final    NO GROWTH 2 DAYS Performed at [facility], [address], [place], [place] [address]    Report Status PENDING  Incomplete  Blood culture (routine x 2)     Status: None (Preliminary result)   Collection Time: 08/11/24  7:26 AM   Specimen: BLOOD  Result Value Ref Range  Status   Specimen Description BLOOD BLOOD RIGHT HAND  Final   Special Requests   Final    BOTTLES DRAWN AEROBIC AND ANAEROBIC Blood Culture results may not be optimal due to an inadequate volume of blood received in culture bottles   Culture   Final    NO GROWTH 2 DAYS Performed at [facility], [address], [place], [place] [address]    Report Status PENDING  Incomplete  MRSA Next Gen by PCR, Nasal     Status: None   Collection Time: 08/11/24  4:00 PM   Specimen: Nasal Mucosa; Nasal Swab  Result Value Ref Range Status   MRSA by PCR Next Gen NOT  DETECTED NOT DETECTED Final    Comment: (NOTE) The GeneXpert MRSA Assay (FDA approved for NASAL specimens only), is one component of a comprehensive MRSA colonization surveillance program. It is not intended to diagnose MRSA infection nor to guide or monitor treatment for MRSA infections. Test performance is not FDA approved in patients less than 72 years old. Performed at [facility], [address] [place], [place] [address]     Coagulation Studies: Recent Labs    08/13/24 0937  LABPROT 16.7*  INR 1.3*    Urinalysis: No results for input(s): COLORURINE, LABSPEC, PHURINE, GLUCOSEU, HGBUR, BILIRUBINUR, KETONESUR, PROTEINUR, UROBILINOGEN, NITRITE, LEUKOCYTESUR in the last 72 hours.  Invalid input(s): APPERANCEUR    Imaging: US  ASCITES (ABDOMEN LIMITED) Result Date: 08/13/2024 CLINICAL DATA:  Abdominal distension. EXAM: LIMITED ABDOMEN ULTRASOUND FOR ASCITES TECHNIQUE: Limited ultrasound survey for ascites was performed in all four abdominal quadrants. COMPARISON:  None Available. FINDINGS: Fluid is seen in all 4 quadrants.  Liver margin is irregular. IMPRESSION: 1. Large ascites. 2. Cirrhosis. Electronically Signed   By: Newell Eke M.D.   On: 08/13/2024 14:25   PERIPHERAL VASCULAR CATHETERIZATION Result Date: 08/12/2024 See surgical note for result.  DG Hand Complete Left Result Date: 08/11/2024 EXAM: 3 OR MORE VIEW(S) XRAY OF THE LEFT HAND 08/11/2024 05:18:00 PM COMPARISON: None available. CLINICAL HISTORY: Foreign body (FB) in soft tissue. FINDINGS: BONES AND JOINTS: No acute fracture. Subluxation of the third metacarpophalangeal joint. Joint space narrowing of the third metacarpophalangeal joint, with erosive changes at the third metacarpal head and base of the third proximal phalanx, consistent with sequela of inflammatory or infectious arthropathy. SOFT TISSUES: Diffuse vascular calcifications. Diffuse soft tissue swelling of the third  digit. No definitive radiopaque foreign body identified. IMPRESSION: 1. Diffuse soft tissue swelling of the third digit without a definite radiopaque foreign body. 2. Third metacarpophalangeal joint subluxation with erosive changes, most consistent with inflammatory or infectious arthropathy. 3. Diffuse vascular calcifications. Electronically signed by: Greig Pique MD MD 08/11/2024 08:06 PM EST RP Workstation: [id number]   MR HAND LEFT WO CONTRAST Result Date: 08/11/2024 EXAM: MR LEFT HAND WITHOUT INTRAVENOUS CONTRAST 08/11/2024 03:16:04 PM TECHNIQUE: Multiplanar magnetic resonance images of the left hand without intravenous contrast. COMPARISON: None available. CLINICAL HISTORY: Nonhealing wound along the dorsal middle finger MCP joint, reduced range of motion of the middle finger. FINDINGS: LIMITATIONS/ARTIFACTS: Motion artifact is present, reducing diagnostic sensitivity and specificity. SOFT TISSUES: The dorsal soft tissue ulceration along the middle finger MCP joint region is shown on image 25 series 4. Low grade subcutaneous edema in the interosseous muscle adjacent to the distal middle finger metacarpal. JOINTS: Possible erosion of the dorsal base of the proximal phalanx with anterior subluxation of the proximal phalanx with respect to the metacarpal head. BONES: Underlying reported ulceration along the middle  finger MCP joint, there is abnormal marrow edema in the base of the proximal phalanx and in the head of the middle finger metacarpal suspicious for active osteomyelitis. IMPRESSION: 1. Abnormal marrow edema in the base of the proximal phalanx and in the head of the middle finger metacarpal, suspicious for active osteomyelitis, with overlying dorsal ulceration. 2. Erosion of the dorsal base of the proximal phalanx middle finger with anterior subluxation of the proximal phalanx with respect to the metacarpal head. 3. Low grade adjacent interosseous muscle and subcutaneous edema. Adjacent to the distal  3rd digit metacarpal. 4. Motion artifact reduces diagnostic sensitivity and specificity. Electronically signed by: Ryan Salvage MD MD 08/11/2024 03:47 PM EST RP Workstation: [id number]     Medications:      amLODipine   5 mg Oral Daily   aspirin  EC  81 mg Oral Daily   atorvastatin   10 mg Oral Daily   clopidogrel   75 mg Oral Daily   feeding supplement (NEPRO CARB STEADY)  237 mL Oral BID BM   heparin   5,000 Units Subcutaneous Q12H   lactulose   20 g Oral TID   nicotine   14 mg Transdermal Daily   acetaminophen  **OR** acetaminophen , diphenhydrAMINE , HYDROmorphone  (DILAUDID ) injection, HYDROmorphone  **OR** HYDROmorphone , lidocaine  (PF), lidocaine -prilocaine , LORazepam , ondansetron  **OR** ondansetron  (ZOFRAN ) IV, pentafluoroprop-tetrafluoroeth, prednisoLONE  acetate, senna-docusate, traZODone , triamcinolone  cream  Assessment/ Plan:  Ms. Mario [name] is a 54 y.o.  adult with past medical history including hypertension, neuropathy, chronic liver cirrhosis, portal hypertension, and end-stage renal disease on hemodialysis. Patient seen in ED for foot pain and swelling. He has been admitted for Osteomyelitis (HCC) [M86.9]  [facility]/TTS/Lt AVF  End stage renal disease on hemodialysis. Last treatment received on Friday. Receiving dialysis today.   2. Left foot pain, primary team suspects claudication. Angiogram yesterday. No further vascular surgery intervention recommended.  3. Anemia of chronic kidney disease Lab Results  Component Value Date   HGB 11.6 (L) 08/13/2024    Hgb is optimal   4. Secondary Hyperparathyroidism: with outpatient labs: None  Lab Results  Component Value Date   PTH 1,236 (H) 04/15/2020   CALCIUM  9.5 08/13/2024   PHOS 9.3 (H) 10/16/2021    Will continue to monitor bone minerals.  New labs pending.   LOS: 1 Exodus Kutzer SHAUNNA Dines 1/10/20263:09 PM   "

## 2024-08-13 NOTE — Progress Notes (Signed)
 Dressing to L 5th MTPJ ulcer eschar changed at this time. Wound cleansed with normal saline flush then painted with betadine. Mepilex foam boarder applied per orders to project area when ambulating.

## 2024-08-14 DIAGNOSIS — Z515 Encounter for palliative care: Secondary | ICD-10-CM

## 2024-08-14 DIAGNOSIS — Z789 Other specified health status: Secondary | ICD-10-CM

## 2024-08-14 DIAGNOSIS — L03119 Cellulitis of unspecified part of limb: Secondary | ICD-10-CM

## 2024-08-14 DIAGNOSIS — M86672 Other chronic osteomyelitis, left ankle and foot: Secondary | ICD-10-CM

## 2024-08-14 DIAGNOSIS — M79672 Pain in left foot: Secondary | ICD-10-CM

## 2024-08-14 DIAGNOSIS — K7460 Unspecified cirrhosis of liver: Secondary | ICD-10-CM

## 2024-08-14 DIAGNOSIS — Z7189 Other specified counseling: Secondary | ICD-10-CM

## 2024-08-14 DIAGNOSIS — K7290 Hepatic failure, unspecified without coma: Secondary | ICD-10-CM

## 2024-08-14 LAB — CBC
HCT: 37.4 % — ABNORMAL LOW (ref 39.0–52.0)
Hemoglobin: 12.2 g/dL — ABNORMAL LOW (ref 13.0–17.0)
MCH: 33.3 pg (ref 26.0–34.0)
MCHC: 32.6 g/dL (ref 30.0–36.0)
MCV: 102.2 fL — ABNORMAL HIGH (ref 80.0–100.0)
Platelets: 90 K/uL — ABNORMAL LOW (ref 150–400)
RBC: 3.66 MIL/uL — ABNORMAL LOW (ref 4.22–5.81)
RDW: 15 % (ref 11.5–15.5)
WBC: 3.7 K/uL — ABNORMAL LOW (ref 4.0–10.5)
nRBC: 0 % (ref 0.0–0.2)

## 2024-08-14 LAB — VITAMIN D 25 HYDROXY (VIT D DEFICIENCY, FRACTURES): Vit D, 25-Hydroxy: 18.7 ng/mL — ABNORMAL LOW (ref 30–100)

## 2024-08-14 LAB — BASIC METABOLIC PANEL WITH GFR
Anion gap: 12 (ref 5–15)
BUN: 25 mg/dL — ABNORMAL HIGH (ref 6–20)
CO2: 27 mmol/L (ref 22–32)
Calcium: 9.8 mg/dL (ref 8.9–10.3)
Chloride: 93 mmol/L — ABNORMAL LOW (ref 98–111)
Creatinine, Ser: 5.17 mg/dL — ABNORMAL HIGH (ref 0.61–1.24)
GFR, Estimated: 12 mL/min — ABNORMAL LOW
Glucose, Bld: 79 mg/dL (ref 70–99)
Potassium: 4.2 mmol/L (ref 3.5–5.1)
Sodium: 132 mmol/L — ABNORMAL LOW (ref 135–145)

## 2024-08-14 LAB — VITAMIN B12: Vitamin B-12: 2900 pg/mL — ABNORMAL HIGH (ref 180–914)

## 2024-08-14 LAB — PHOSPHORUS: Phosphorus: 5.3 mg/dL — ABNORMAL HIGH (ref 2.5–4.6)

## 2024-08-14 LAB — FOLATE: Folate: 12.1 ng/mL

## 2024-08-14 MED ORDER — HYDROMORPHONE HCL 1 MG/ML IJ SOLN
0.5000 mg | INTRAMUSCULAR | Status: DC | PRN
  Administered 2024-08-14 – 2024-08-15 (×4): 1 mg via INTRAVENOUS
  Filled 2024-08-14 (×4): qty 1

## 2024-08-14 MED ORDER — LACTULOSE 10 GM/15ML PO SOLN
20.0000 g | Freq: Four times a day (QID) | ORAL | Status: DC
  Administered 2024-08-15 (×4): 20 g via ORAL
  Filled 2024-08-14 (×5): qty 30

## 2024-08-14 MED ORDER — SENNOSIDES-DOCUSATE SODIUM 8.6-50 MG PO TABS
1.0000 | ORAL_TABLET | Freq: Every day | ORAL | Status: DC
  Administered 2024-08-15 – 2024-08-17 (×2): 1 via ORAL
  Filled 2024-08-14 (×2): qty 1

## 2024-08-14 MED ORDER — POLYETHYLENE GLYCOL 3350 17 G PO PACK
17.0000 g | PACK | Freq: Every day | ORAL | Status: DC
  Administered 2024-08-15 – 2024-08-17 (×3): 17 g via ORAL
  Filled 2024-08-14 (×3): qty 1

## 2024-08-14 MED ORDER — SEVELAMER CARBONATE 800 MG PO TABS
800.0000 mg | ORAL_TABLET | Freq: Three times a day (TID) | ORAL | Status: DC
  Administered 2024-08-14 – 2024-08-18 (×12): 800 mg via ORAL
  Filled 2024-08-14 (×12): qty 1

## 2024-08-14 MED ORDER — ONDANSETRON HCL 4 MG/2ML IJ SOLN
4.0000 mg | INTRAMUSCULAR | Status: DC | PRN
  Administered 2024-08-14 – 2024-08-15 (×3): 4 mg via INTRAVENOUS
  Filled 2024-08-14 (×4): qty 2

## 2024-08-14 MED ORDER — GABAPENTIN 100 MG PO CAPS
100.0000 mg | ORAL_CAPSULE | Freq: Once | ORAL | Status: AC
  Administered 2024-08-14: 100 mg via ORAL
  Filled 2024-08-14: qty 1

## 2024-08-14 MED ORDER — ONDANSETRON HCL 4 MG PO TABS: 4.0000 mg | ORAL_TABLET | Freq: Four times a day (QID) | ORAL | Status: DC | PRN

## 2024-08-14 NOTE — Progress Notes (Signed)
 " Progress Note   Patient: Mario [name] FMW:[id number] DOB: Jul 10, 1971 DOA: 08/11/2024     2 DOS: the patient was seen and examined on 08/14/2024   Brief hospital course: Per H&P HPI  Mario [name] is a 54 y.o. adult with medical history significant of ESRD and HD TTS, HTN HLD, presented with worsening of left foot pain and nonhealing wound of left hand.   Patient complains about having left foot pain for 7+ days, quality getting worse, last 2 days even putting weight on left foot can trigger significant pain.  He pointed the pain mainly to the 5 toes especially the 1st and 5th toes, he has a small ulcer on lateral side of the pinky toe.  Denies any history of claudications.  Meantime he also complained about a unhealing wound of left hand, located on the extension side of the MCP joint, this has been a more chronic issue and he reported that he has not been able to bend left middle finger for several months.  Denied any fever or chills.  Last dialysis was last Tuesday.   ED Course: Afebrile, nontachycardic blood pressure 164/73 O2 saturation 100% on room air.  CTA of abdomen pelvis bifurcation lower extremity showed no significant occlusion or stenosis of major vessels but heavy calcification of medium and small vessels.  Blood work showed WBC 5.3 hemoglobin 12.5 BUN 40 creatinine 7.1 K4.5 bicarb 25.   Vascular surgery consulted.  Assessment and Plan: Rest ischemic pain V neuropathic pain  S/p LE angiography with angioplasty, per VVS no further intervention recommended at this time. Per podiatry no surgical needs from their standpoint at this time.   Continue statin.  Continue DAPT Start gabapentin  on HD days after HD   ESRD on HD TThS Continue HD per nephrology   Severe hepatic cirrhosis  Coagulopathy with elevated INR, thrombocytopenia, noted to have esophageal and paraesophageal varices as well as splenomegaly.  Patient with large volume ascites on ultrasound.   Patient states that he does not drink alcohol.  Hepatitis B is negative. -Start low dose lasix/spiro for volume control -Lactulose  titrate to 2-3 bowel movements a day  -Check hepatitis C -will consult IR for paracentesis   L hand non healing ulcer Likely chronic osteo/septic arthritis at MCP of L 3rd digit.  Given patient's social situation he would likely require I&D here will transfer to [facility] for plastics.     Hyper K resolved S/p HD   HTN Continue amlodipine    HLD Continue statin   Macrocytic anemia  Check iron  panel, b12, folate  Thrombocytopenia Appears chronic   Tobacco use disorder Will discuss cessation Nicotine  replacement therapy      Subjective: Pain better controlled with current pain regimen. Having some left hand pain as well.   Physical Exam: Vitals:   08/13/24 2022 08/14/24 0358 08/14/24 0808 08/14/24 1553  BP: (!) 149/70 132/75 139/71 (!) 161/73  Pulse: 65 63 65 67  Resp: 16 16 20 20   Temp: 98.1 F (36.7 C) (!) 97 F (36.1 C) 97.7 F (36.5 C) 98.2 F (36.8 C)  TempSrc:   Oral   SpO2: 97% 98% 99% 99%  Weight:      Height:          Constitutional: In no distress. Thin, disheveled.  Cardiovascular: Normal rate, regular rhythm. No lower extremity edema  Pulmonary: Non labored breathing on room air, no wheezing or rales.   Abdominal: Soft. Non distended and non tender Musculoskeletal: L foot, lateral  aspect with eschar, dorsum of foot erythematous, TT mild palpation.    L hand ulcer  Neurological: Alert and oriented to person, place, and time. Non focal  Skin: Skin is warm and dry.    Data Reviewed: {Tip this will not be part of the note when signed- Document your independent interpretation of telemetry tracing, EKG, lab, Radiology test or any other diagnostic tests. Add any new diagnostic test ordered today. (Optional):[id number]     Latest Ref Rng & Units 08/14/2024    6:39 AM 08/13/2024    6:36 AM 08/12/2024    6:00 AM  BMP   Glucose 70 - 99 mg/dL 79  80  72   BUN 6 - 20 mg/dL 25  36  46   Creatinine 0.61 - 1.24 mg/dL 4.82  3.42  1.83   Sodium 135 - 145 mmol/L 132  133  134   Potassium 3.5 - 5.1 mmol/L 4.2  4.9  5.4   Chloride 98 - 111 mmol/L 93  94  96   CO2 22 - 32 mmol/L 27  25  22    Calcium  8.9 - 10.3 mg/dL 9.8  9.5  9.7       Latest Ref Rng & Units 08/14/2024    6:39 AM 08/13/2024    6:36 AM 08/12/2024   11:00 AM  CBC  WBC 4.0 - 10.5 K/uL 3.7  4.3  4.7   Hemoglobin 13.0 - 17.0 g/dL 87.7  88.3  88.0   Hematocrit 39.0 - 52.0 % 37.4  36.1  36.6   Platelets 150 - 400 K/uL 90  91  102      Family Communication: None available in the [place]   Disposition: Status is: Inpatient Remains inpatient appropriate because: management of foot pain   Planned Discharge Destination: Pending clinical course     Time spent: 35 minutes  Author: Alban Pepper, MD 08/14/2024 5:39 PM  For on call review [email or web address].  "

## 2024-08-14 NOTE — Plan of Care (Signed)

## 2024-08-14 NOTE — Progress Notes (Signed)
 " [facility]  ROUNDING NOTE   Subjective:   Mario [name] is a 54 y.o. male with past medical history including hypertension, neuropathy, chronic liver cirrhosis, portal hypertension, and end-stage renal disease on hemodialysis. Patient seen in ED for foot pain and swelling. He has been admitted for Osteomyelitis [facility] [M86.9] Patient is known to our practice and receives outpatient dialysis treatments at [facility] on a TTS schedule, overseen by [employer].  Update: Patient c/o foot pain L>R. No other issues. On room air.     Objective:  Vital signs in last 24 hours:  Temp:  [97 F (36.1 C)-98.6 F (37 C)] 97.7 F (36.5 C) (01/11 0808) Pulse Rate:  [49-71] 65 (01/11 0808) Resp:  [12-20] 20 (01/11 0808) BP: (132-170)/(62-85) 139/71 (01/11 0808) SpO2:  [97 %-100 %] 99 % (01/11 0808) Weight:  [62.2 kg] 62.2 kg (01/10 1608)  Weight change: -4.8 kg Filed Weights   08/12/24 1045 08/12/24 1335 08/13/24 1608  Weight: 67 kg 66.8 kg 62.2 kg    Intake/Output: I/O last 3 completed shifts: In: 960 [P.O.:960] Out: 2000 [Other:2000]   Intake/Output this shift:  Total I/O In: 240 [P.O.:240] Out: -   Physical Exam: General: Grimacing in pain  Head: Normocephalic  Eyes: Anicteric  Lungs:  normal effort, on room air  Heart: Regular rate an  Abdomen:  Soft, nontender  Extremities:  No peripheral edema.  Neurologic: Awake, alert  Skin: Warm,dry  Access: Lt AVF    Basic Metabolic Panel: Recent Labs  Lab 08/11/24 0726 08/12/24 0600 08/13/24 0636 08/14/24 0639  NA 135 134* 133* 132*  K 4.5 5.4* 4.9 4.2  CL 94* 96* 94* 93*  CO2 25 22 25 27   GLUCOSE 73 72 80 79  BUN 40* 46* 36* 25*  CREATININE 7.12* 8.16* 6.57* 5.17*  CALCIUM  9.8 9.7 9.5 9.8  PHOS  --   --  6.2* 5.3*    Liver Function Tests: Recent Labs  Lab 08/11/24 0726  AST 36  ALT 13  ALKPHOS 188*  BILITOT 0.8  PROT 7.6  ALBUMIN 4.0   No results for input(s):  LIPASE, AMYLASE in the last 168 hours. No results for input(s): AMMONIA in the last 168 hours.  CBC: Recent Labs  Lab 08/11/24 0726 08/12/24 1100 08/13/24 0636 08/14/24 0639  WBC 5.3 4.7 4.3 3.7*  NEUTROABS 3.8  --  3.0  --   HGB 12.5* 11.9* 11.6* 12.2*  HCT 39.0 36.6* 36.1* 37.4*  MCV 104.0* 103.4* 104.9* 102.2*  PLT 105* 102* 91* 90*    Cardiac Enzymes: No results for input(s): CKTOTAL, CKMB, CKMBINDEX, TROPONINI in the last 168 hours.  BNP: Invalid input(s): POCBNP  CBG: No results for input(s): GLUCAP in the last 168 hours.  Microbiology: Results for orders placed or performed during the hospital encounter of 08/11/24  Blood culture (routine x 2)     Status: None (Preliminary result)   Collection Time: 08/11/24  7:15 AM   Specimen: BLOOD  Result Value Ref Range Status   Specimen Description BLOOD RIGHT ANTECUBITAL  Final   Special Requests   Final    BOTTLES DRAWN AEROBIC AND ANAEROBIC Blood Culture adequate volume   Culture   Final    NO GROWTH 3 DAYS Performed at [facility], [address] [place], [place] [address]    Report Status PENDING  Incomplete  Blood culture (routine x 2)     Status: None (Preliminary result)   Collection Time: 08/11/24  7:26 AM   Specimen: BLOOD  Result Value Ref Range Status   Specimen Description BLOOD BLOOD RIGHT HAND  Final   Special Requests   Final    BOTTLES DRAWN AEROBIC AND ANAEROBIC Blood Culture results may not be optimal due to an inadequate volume of blood received in culture bottles   Culture   Final    NO GROWTH 3 DAYS Performed at [facility], [address], [place], [place] [address]    Report Status PENDING  Incomplete  MRSA Next Gen by PCR, Nasal     Status: None   Collection Time: 08/11/24  4:00 PM   Specimen: Nasal Mucosa; Nasal Swab  Result Value Ref Range Status   MRSA by PCR Next Gen NOT DETECTED NOT DETECTED Final    Comment: (NOTE) The GeneXpert MRSA Assay  (FDA approved for NASAL specimens only), is one component of a comprehensive MRSA colonization surveillance program. It is not intended to diagnose MRSA infection nor to guide or monitor treatment for MRSA infections. Test performance is not FDA approved in patients less than 24 years old. Performed at [facility], [address] [place], [place] [address]     Coagulation Studies: Recent Labs    08/13/24 0937  LABPROT 16.7*  INR 1.3*    Urinalysis: No results for input(s): COLORURINE, LABSPEC, PHURINE, GLUCOSEU, HGBUR, BILIRUBINUR, KETONESUR, PROTEINUR, UROBILINOGEN, NITRITE, LEUKOCYTESUR in the last 72 hours.  Invalid input(s): APPERANCEUR    Imaging: US  ASCITES (ABDOMEN LIMITED) Result Date: 08/13/2024 CLINICAL DATA:  Abdominal distension. EXAM: LIMITED ABDOMEN ULTRASOUND FOR ASCITES TECHNIQUE: Limited ultrasound survey for ascites was performed in all four abdominal quadrants. COMPARISON:  None Available. FINDINGS: Fluid is seen in all 4 quadrants.  Liver margin is irregular. IMPRESSION: 1. Large ascites. 2. Cirrhosis. Electronically Signed   By: Newell Eke M.D.   On: 08/13/2024 14:25   PERIPHERAL VASCULAR CATHETERIZATION Result Date: 08/12/2024 See surgical note for result.    Medications:      amLODipine   5 mg Oral Daily   aspirin  EC  81 mg Oral Daily   atorvastatin   10 mg Oral Daily   clopidogrel   75 mg Oral Daily   feeding supplement (NEPRO CARB STEADY)  237 mL Oral BID BM   heparin   5,000 Units Subcutaneous Q12H   lactulose   20 g Oral TID   nicotine   14 mg Transdermal Daily   sevelamer  carbonate  800 mg Oral TID WC   acetaminophen  **OR** acetaminophen , diphenhydrAMINE , HYDROmorphone  (DILAUDID ) injection, HYDROmorphone  **OR** HYDROmorphone , LORazepam , ondansetron  **OR** ondansetron  (ZOFRAN ) IV, prednisoLONE  acetate, senna-docusate, traZODone , triamcinolone  cream  Assessment/ Plan:  Ms. Mario [name] is a 54  y.o.  adult with past medical history including hypertension, neuropathy, chronic liver cirrhosis, portal hypertension, and end-stage renal disease on hemodialysis. Patient seen in ED for foot pain and swelling. He has been admitted for Osteomyelitis (HCC) [M86.9]  [facility]/TTS/Lt AVF  End stage renal disease on hemodialysis. Patient tolerated treatment yesterday. 2L removed.  2. Left foot pain, primary team suspects claudication. Angiogram yesterday. No further vascular surgery intervention recommended.  3. Anemia of chronic kidney disease Lab Results  Component Value Date   HGB 12.2 (L) 08/14/2024    Hgb is optimal   4. Secondary Hyperparathyroidism: with outpatient labs: None  Lab Results  Component Value Date   PTH 1,236 (H) 04/15/2020   CALCIUM  9.8 08/14/2024   PHOS 5.3 (H) 08/14/2024    Will continue to monitor bone minerals.  Waiting  on new labs.    LOS: 2 Mario [name] Mario [name] 1/11/202612:55 PM   "

## 2024-08-14 NOTE — Consult Note (Addendum)
 "                                                                            Consult was completed with [facility] Phone Language line with assistance from interpreter, Mario [name], as patient is Spanish speaking only         Consultation Note Date: 08/14/2024 at 0821 Reason for consultation: Goals of care Patient with ESRD on HD and severe hepatic cirrhosis.    Patient Name: Mario [name]  DOB: [birth year]  MRN: [id number]  Age / Sex: 54 y.o., adult  PCP: Marcelino Gales, MD Referring Physician: Franchot Novel, MD  HPI/Patient Profile: 54 y.o. adult  with past medical history significant for ESRD on HD (T,T,S), HTN, HLD and cirrhosis and CHF. Patient presented to ED  from [facility] 08/11/2024 c/o pain and swelling to L foot x 5 days with associated swelling and discoloration. Pt denied trauma and rated pain 10/10 not relieved with Tylenol . He also complained of L hand wound that has been present for several weeks.  ED labs significant for Hgb 12.5, plts 105, BUN 40, creatinine 7.12, GFR 8, anion gap 16. Alk phos 188. aPTT 39, CRP 3.4.   ED vitals 164/73, HR 66, RR 18, SpO2 100% RA, 98.39F  Venous US  LLE: 1. No evidence of left lower extremity DVT. 2. Suspect  calcified peripheral vascular disease in the left lower extremity.  CTA Aortobifemoral: VASCULAR   1. No evidence of hemodynamically significant stenosis or occlusive disease in the inflow (aortoiliac) or outflow (femoropopliteal) segments. Evaluation of the runoff (infrageniculate) arteries is limited by severe medial arteriosclerosis. The circumferential calcification of the medium and small artery walls creates streak artifact which severely limits evaluation of the internal lumens. Catheter directed angiography could be considered if clinically warranted. 2. Extensive peripheral calcified atherosclerotic plaque. 3. Extensive peripheral calcification of the inferior vena cava and bilateral iliac  vessels, similar compared to prior. 4. No evidence of arterial aneurysm or dissection.   NON-VASCULAR   1. Severe hepatic cirrhosis with splenomegaly, esophageal and paraesophageal varices and moderate ascites. 2. No acute abnormality within the abdomen or pelvis. 3. Similar appearance of atrophic native kidneys.  MR L foot:  1. Small lateral erosion of the head of the 5th metatarsal with adjacent marrow edema and overlying soft tissue thinning/possible ulceration, raising concern for early osteomyelitis. 2. Trace subcutaneous edema along the dorsum of the forefoot, which can be seen with cellulitis. 3. Mild degenerative arthropathy at the 1st metatarsophalangeal joint.  MR L hand: 1. Abnormal marrow edema in the base of the proximal phalanx and in the head of the middle finger metacarpal, suspicious for active osteomyelitis, with overlying dorsal ulceration. 2. Erosion of the dorsal base of the proximal phalanx middle finger with anterior subluxation of the proximal phalanx with respect to the metacarpal head. 3. Low grade adjacent interosseous muscle and subcutaneous edema. Adjacent to the distal 3rd digit metacarpal. 4. Motion artifact reduces diagnostic sensitivity and specificity.  TRH was consulted for admission and management of chronic osteomyelitis of L foot and L hand nonhealing ulcer and ESRD on HD (TTS).  Palliative medicine team was consulted to assist with goals of care conversations in the setting  of ESRD on HD and severe hepatic cirrhosis.    Summary of counseling/coordination of care Chart reviewed:   Labs: Renal dysfunction expected with HD. Hyponatremia persists.      Latest Ref Rng & Units 08/14/2024    6:39 AM 08/13/2024    6:36 AM 08/12/2024   11:00 AM  CBC  WBC 4.0 - 10.5 K/uL 3.7  4.3  4.7   Hemoglobin 13.0 - 17.0 g/dL 87.7  88.3  88.0   Hematocrit 39.0 - 52.0 % 37.4  36.1  36.6   Platelets 150 - 400 K/uL 90  91  102       Latest Ref Rng & Units  08/14/2024    6:39 AM 08/13/2024    6:36 AM 08/12/2024    6:00 AM  CMP  Glucose 70 - 99 mg/dL 79  80  72   BUN 6 - 20 mg/dL 25  36  46   Creatinine 0.61 - 1.24 mg/dL 4.82  3.42  1.83   Sodium 135 - 145 mmol/L 132  133  134   Potassium 3.5 - 5.1 mmol/L 4.2  4.9  5.4   Chloride 98 - 111 mmol/L 93  94  96   CO2 22 - 32 mmol/L 27  25  22    Calcium  8.9 - 10.3 mg/dL 9.8  9.5  9.7      Vitals: Blood pressure 139/71, pulse 65, temperature 97.7 F (36.5 C), temperature source Oral, resp. rate 20, height 5' 2.99 (1.6 m), weight 62.2 kg, SpO2 99%.   Progress notes: Reviewed progress notes from TRH, Vascular team, Nephrology, Orthopedics, ED staff/providers and floor nursing staff.   Imaging: US  abdomen limited 1/10 demonstrated large amount ascites and cirrhosis. Paracentesis planned for 1/12.   MAR: Discussed pain management with Dr. Franchot and primary RN   ACP documents: None on file   I introduced Palliative Medicine as specialized medical care for people living with serious illness. It focuses on providing relief from the symptoms and stress of a serious illness. The goal is to improve quality of life for both the patient and the family.  Ill-appearing, older than stated age male lying in bed. He does acknowledge my presence and participates in conversation, answering questions appropriately with the assistance of telephonic interpreter, but does not make eye contact. He is alert and oriented to self, time, location and situation. Respirations are even and unlabored. He is in no distress. He does yell out in pain several times during visit.   Patient reports continuing pain to L foot and L hand. He states pain medication does help but pain persists. He reports eating well. Denies CP/SOB. Endorses some nausea. Sleeping good. He states he needs pain medication now. RN made aware of request.   We discussed a brief life review of the patient. Mario [name] has never been married and has no children. He  reports not working for over 14 years. He has friends and cousins here in [place] .   As far as functional and nutritional status, Tyan lives alone. He normally performs all of his ADLs independently. He does not use DME for ambulation. He describes his appetite as good. He utilizes transportation service to go to [facility] in [place]. He does not drive.   We discussed patient's current illness and what it means in the larger context of patient's on-going co-morbidities.  Natural disease trajectory and expectations at EOL were discussed.  I attempted to elicit values and goals of care important to the  patient.  The most important goal for patient at this time is to be able to go home.   The difference between aggressive medical intervention and comfort care was considered in light of the patient's goals of care.   Advance directives, concepts specific to code status, hydration, and rehospitalization were considered and discussed. Pt endorses wanting to be full code/full scope care.   Family is facing treatment option decisions, advanced directive, and anticipatory care needs. Labrandon shares in the event he were to become confused and unable to make decisions for himself, he would want his cousin, Mario [name] and Mario [name], friend, to be his surrogate decision makers.    Questions and concerns were addressed. The patient was encouraged to call with questions or concerns.   Primary Decision Maker PATIENT  Physical Exam Vitals and nursing note reviewed.  Constitutional:      General: She is not in acute distress.    Appearance: She is ill-appearing.  HENT:     Head: Normocephalic and atraumatic.     Mouth/Throat:     Mouth: Mucous membranes are moist.  Pulmonary:     Effort: Pulmonary effort is normal. No respiratory distress.  Abdominal:     General: There is distension.     Palpations: Abdomen is soft.     Tenderness: There is no abdominal tenderness. There is no  guarding.     Comments: Caput Medusae  Musculoskeletal:     Right lower leg: No edema.     Left lower leg: No edema.     Comments: Dressing to L lateral aspect of foot  AVF to LUE Wound to dorsum of L hand    Skin:    General: Skin is warm and dry.  Neurological:     Mental Status: She is alert and oriented to person, place, and time.  Psychiatric:        Mood and Affect: Mood normal.        Behavior: Behavior normal.        Thought Content: Thought content normal.        Judgment: Judgment normal.   Recommendations/Plan: FULL CODE status as previously documented    Continue current supportive interventions PMT collaborating with TRH for pain control Following TOC for disposition  PMT will continue to follow   Palliative Assessment/Data: 70%   Discussed plan of care with Dr. Franchot and Kristyn, RN  Thank you for this consult. Palliative medicine will continue to follow and assist holistically.   I personally spent a total of 90 minutes in the care of the patient today including preparing to see the patient, getting/reviewing separately obtained history, performing a medically appropriate exam/evaluation, counseling and educating, referring and communicating with other health care professionals, documenting clinical information in the EHR, independently interpreting results, communicating results, and coordinating care.     Devere Sacks, ELNITA- [facility] Palliative Medicine Team  08/14/2024 8:21 AM  Office [phone]  Pager [phone]     Please contact Palliative Medicine Team providers via AMION for questions and concerns.   "

## 2024-08-15 ENCOUNTER — Inpatient Hospital Stay: Payer: Self-pay

## 2024-08-15 LAB — CBC WITH DIFFERENTIAL/PLATELET
Abs Immature Granulocytes: 0.01 K/uL (ref 0.00–0.07)
Basophils Absolute: 0 K/uL (ref 0.0–0.1)
Basophils Relative: 1 %
Eosinophils Absolute: 0.1 K/uL (ref 0.0–0.5)
Eosinophils Relative: 3 %
HCT: 36.7 % — ABNORMAL LOW (ref 39.0–52.0)
Hemoglobin: 11.9 g/dL — ABNORMAL LOW (ref 13.0–17.0)
Immature Granulocytes: 0 %
Lymphocytes Relative: 12 %
Lymphs Abs: 0.5 K/uL — ABNORMAL LOW (ref 0.7–4.0)
MCH: 33 pg (ref 26.0–34.0)
MCHC: 32.4 g/dL (ref 30.0–36.0)
MCV: 101.7 fL — ABNORMAL HIGH (ref 80.0–100.0)
Monocytes Absolute: 0.5 K/uL (ref 0.1–1.0)
Monocytes Relative: 14 %
Neutro Abs: 2.7 K/uL (ref 1.7–7.7)
Neutrophils Relative %: 70 %
Platelets: 87 K/uL — ABNORMAL LOW (ref 150–400)
RBC: 3.61 MIL/uL — ABNORMAL LOW (ref 4.22–5.81)
RDW: 14.7 % (ref 11.5–15.5)
WBC: 3.9 K/uL — ABNORMAL LOW (ref 4.0–10.5)
nRBC: 0 % (ref 0.0–0.2)

## 2024-08-15 LAB — HCV AB W REFLEX TO QUANT PCR: HCV Ab: NONREACTIVE

## 2024-08-15 LAB — HCV INTERPRETATION

## 2024-08-15 MED ORDER — HYDROMORPHONE HCL 1 MG/ML IJ SOLN: 1.0000 mg | INTRAMUSCULAR | Status: DC | PRN

## 2024-08-15 MED ORDER — HYDROMORPHONE HCL 2 MG PO TABS
4.0000 mg | ORAL_TABLET | ORAL | Status: DC | PRN
  Administered 2024-08-15 – 2024-08-17 (×9): 4 mg via ORAL
  Filled 2024-08-15 (×9): qty 2

## 2024-08-15 MED ORDER — PROCHLORPERAZINE MALEATE 5 MG PO TABS
5.0000 mg | ORAL_TABLET | Freq: Four times a day (QID) | ORAL | Status: DC | PRN
  Administered 2024-08-16 (×2): 5 mg via ORAL
  Filled 2024-08-15 (×3): qty 1

## 2024-08-15 MED ORDER — GABAPENTIN 100 MG PO CAPS
200.0000 mg | ORAL_CAPSULE | ORAL | Status: DC
  Administered 2024-08-16: 200 mg via ORAL
  Filled 2024-08-15: qty 2

## 2024-08-15 MED ORDER — LIDOCAINE HCL (PF) 1 % IJ SOLN
10.0000 mL | Freq: Once | INTRAMUSCULAR | Status: AC
  Administered 2024-08-15: 10 mL via INTRADERMAL

## 2024-08-15 MED ORDER — VITAMIN D (ERGOCALCIFEROL) 1.25 MG (50000 UNIT) PO CAPS
50000.0000 [IU] | ORAL_CAPSULE | ORAL | Status: DC
  Administered 2024-08-16: 50000 [IU] via ORAL
  Filled 2024-08-15: qty 1

## 2024-08-15 MED ORDER — FENTANYL CITRATE (PF) 50 MCG/ML IJ SOSY
12.5000 ug | PREFILLED_SYRINGE | INTRAMUSCULAR | Status: DC | PRN
  Administered 2024-08-16: 12.5 ug via INTRAVENOUS
  Filled 2024-08-15: qty 1

## 2024-08-15 MED ORDER — HYDROMORPHONE HCL 2 MG PO TABS
2.0000 mg | ORAL_TABLET | ORAL | Status: DC | PRN
  Administered 2024-08-18 (×2): 2 mg via ORAL
  Filled 2024-08-15 (×2): qty 1

## 2024-08-15 NOTE — Progress Notes (Signed)
 " [facility]  ROUNDING NOTE   Subjective:   Mario [name] is a 54 y.o. male with past medical history including hypertension, neuropathy, chronic liver cirrhosis, portal hypertension, and end-stage renal disease on hemodialysis. Patient seen in ED for foot pain and swelling. He has been admitted for Osteomyelitis [facility]) [M86.9] Patient is known to our practice and receives outpatient dialysis treatments at [facility] on a TTS schedule, overseen by [employer].  Update: Patient seen sitting up in bed Left foot discomfort Room air Denies shortness of breath    Objective:  Vital signs in last 24 hours:  Temp:  [98 F (36.7 C)-99.1 F (37.3 C)] 98 F (36.7 C) (01/12 0819) Pulse Rate:  [62-71] 62 (01/12 1100) Resp:  [16-20] 20 (01/12 0819) BP: (125-161)/(65-90) 125/65 (01/12 1100) SpO2:  [96 %-100 %] 100 % (01/12 1100)  Weight change:  Filed Weights   08/12/24 1045 08/12/24 1335 08/13/24 1608  Weight: 67 kg 66.8 kg 62.2 kg    Intake/Output: I/O last 3 completed shifts: In: 480 [P.O.:480] Out: -    Intake/Output this shift:  No intake/output data recorded.  Physical Exam: General: NAD  Head: Normocephalic  Eyes: Anicteric  Lungs:  normal effort, on room air  Heart: Regular rate an  Abdomen:  Soft, nontender  Extremities:  No peripheral edema  Neurologic: Awake, alert  Skin: Warm,dry  Access: Lt AVF    Basic Metabolic Panel: Recent Labs  Lab 08/11/24 0726 08/12/24 0600 08/13/24 0636 08/14/24 0639  NA 135 134* 133* 132*  K 4.5 5.4* 4.9 4.2  CL 94* 96* 94* 93*  CO2 25 22 25 27   GLUCOSE 73 72 80 79  BUN 40* 46* 36* 25*  CREATININE 7.12* 8.16* 6.57* 5.17*  CALCIUM  9.8 9.7 9.5 9.8  PHOS  --   --  6.2* 5.3*    Liver Function Tests: Recent Labs  Lab 08/11/24 0726  AST 36  ALT 13  ALKPHOS 188*  BILITOT 0.8  PROT 7.6  ALBUMIN 4.0   No results for input(s): LIPASE, AMYLASE in the last 168 hours. No results  for input(s): AMMONIA in the last 168 hours.  CBC: Recent Labs  Lab 08/11/24 0726 08/12/24 1100 08/13/24 0636 08/14/24 0639  WBC 5.3 4.7 4.3 3.7*  NEUTROABS 3.8  --  3.0  --   HGB 12.5* 11.9* 11.6* 12.2*  HCT 39.0 36.6* 36.1* 37.4*  MCV 104.0* 103.4* 104.9* 102.2*  PLT 105* 102* 91* 90*    Cardiac Enzymes: No results for input(s): CKTOTAL, CKMB, CKMBINDEX, TROPONINI in the last 168 hours.  BNP: Invalid input(s): POCBNP  CBG: No results for input(s): GLUCAP in the last 168 hours.  Microbiology: Results for orders placed or performed during the hospital encounter of 08/11/24  Blood culture (routine x 2)     Status: None (Preliminary result)   Collection Time: 08/11/24  7:15 AM   Specimen: BLOOD  Result Value Ref Range Status   Specimen Description BLOOD RIGHT ANTECUBITAL  Final   Special Requests   Final    BOTTLES DRAWN AEROBIC AND ANAEROBIC Blood Culture adequate volume   Culture   Final    NO GROWTH 4 DAYS Performed at [facility], [address] [place], [place] [address]    Report Status PENDING  Incomplete  Blood culture (routine x 2)     Status: None (Preliminary result)   Collection Time: 08/11/24  7:26 AM   Specimen: BLOOD  Result Value Ref Range Status  Specimen Description BLOOD BLOOD RIGHT HAND  Final   Special Requests   Final    BOTTLES DRAWN AEROBIC AND ANAEROBIC Blood Culture results may not be optimal due to an inadequate volume of blood received in culture bottles   Culture   Final    NO GROWTH 4 DAYS Performed at [facility], [address] [place], [place] [address]    Report Status PENDING  Incomplete  MRSA Next Gen by PCR, Nasal     Status: None   Collection Time: 08/11/24  4:00 PM   Specimen: Nasal Mucosa; Nasal Swab  Result Value Ref Range Status   MRSA by PCR Next Gen NOT DETECTED NOT DETECTED Final    Comment: (NOTE) The GeneXpert MRSA Assay (FDA approved for NASAL specimens only), is one  component of a comprehensive MRSA colonization surveillance program. It is not intended to diagnose MRSA infection nor to guide or monitor treatment for MRSA infections. Test performance is not FDA approved in patients less than 26 years old. Performed at [facility], [address] [place], [place] [address]     Coagulation Studies: Recent Labs    08/13/24 0937  LABPROT 16.7*  INR 1.3*    Urinalysis: No results for input(s): COLORURINE, LABSPEC, PHURINE, GLUCOSEU, HGBUR, BILIRUBINUR, KETONESUR, PROTEINUR, UROBILINOGEN, NITRITE, LEUKOCYTESUR in the last 72 hours.  Invalid input(s): APPERANCEUR    Imaging: US  Paracentesis Result Date: 08/15/2024 INDICATION: 54 year old male. History of CHF, end-stage renal disease and cirrhosis. Found to have ascites. Request is for therapeutic and diagnostic paracentesis. EXAM: ULTRASOUND GUIDED THERAPEUTIC AND DIAGNOSTIC RIGHT-SIDED PARACENTESIS MEDICATIONS: Lidocaine  1% 10 mL COMPLICATIONS: None immediate. PROCEDURE: Informed written consent was obtained from the patient after a discussion of the risks, benefits and alternatives to treatment. A timeout was performed prior to the initiation of the procedure. Initial ultrasound scanning demonstrates a moderate amount of ascites within the right lower abdominal quadrant. The right lower abdomen was prepped and draped in the usual sterile fashion. 1% lidocaine  was used for local anesthesia. Following this, a 19 gauge, 7-cm, Yueh catheter was introduced. An ultrasound image was saved for documentation purposes. The paracentesis was performed. The catheter was removed and a dressing was applied. The patient tolerated the procedure well without immediate post procedural complication. FINDINGS: A total of approximately 4.4 L of straw color fluid was removed. Samples were sent to the laboratory as requested by the clinical team. IMPRESSION: Successful ultrasound-guided  therapeutic and diagnostic right-sided paracentesis yielding 4.4 liters of peritoneal fluid. Performed by Delon Beagle NP PLAN: If the patient eventually requires >/=2 paracenteses in a 30 day period, candidacy for formal evaluation by the [facility] Interventional Radiology Portal Hypertension Clinic will be assessed. Electronically Signed   By: CHRISTELLA.  Shick M.D.   On: 08/15/2024 11:19     Medications:      amLODipine   5 mg Oral Daily   aspirin  EC  81 mg Oral Daily   atorvastatin   10 mg Oral Daily   clopidogrel   75 mg Oral Daily   feeding supplement (NEPRO CARB STEADY)  237 mL Oral BID BM   heparin   5,000 Units Subcutaneous Q12H   lactulose   20 g Oral QID   nicotine   14 mg Transdermal Daily   polyethylene glycol  17 g Oral Daily   senna-docusate  1 tablet Oral QHS   sevelamer  carbonate  800 mg Oral TID WC   acetaminophen  **OR** acetaminophen , diphenhydrAMINE , HYDROmorphone  (DILAUDID ) injection, HYDROmorphone  **OR** HYDROmorphone , LORazepam , ondansetron  **OR** ondansetron  (ZOFRAN ) IV,  prednisoLONE  acetate, traZODone , triamcinolone  cream  Assessment/ Plan:  Ms. Mario [name] is a 54 y.o.  adult with past medical history including hypertension, neuropathy, chronic liver cirrhosis, portal hypertension, and end-stage renal disease on hemodialysis. Patient seen in ED for foot pain and swelling. He has been admitted for Osteomyelitis (HCC) [M86.9]  [facility]/TTS/Lt AVF  End stage renal disease on hemodialysis. Next treatment scheduled for Tuesday.   2. Left foot pain, primary team suspects claudication. Angiogram on 1/9. No further vascular surgery intervention recommended. Likely transfer to [facility] for continued management  3. Anemia of chronic kidney disease Lab Results  Component Value Date   HGB 12.2 (L) 08/14/2024    Hgb within optimal range  4. Secondary Hyperparathyroidism: with outpatient labs: None  Lab Results  Component Value Date   PTH 1,236 (H)  04/15/2020   CALCIUM  9.8 08/14/2024   PHOS 5.3 (H) 08/14/2024    Will continue to monitor bone minerals.     LOS: 3 Mario [name] 1/12/202612:18 PM   "

## 2024-08-15 NOTE — Procedures (Addendum)
 PROCEDURE SUMMARY:  Successful ultrasound guided paracentesis from the right  lower quadrant.  Yielded  4.4 liters of straw colored  fluid.  No immediate complications.  The patient tolerated the procedure well.   Specimen was sent for labs.  EBL < 2mL  If the patient eventually requires >/=2 paracenteses in a 30 day period, screening evaluation by the [facility] Interventional Radiology Portal Hypertension Clinic will be assessed.  Patient consented with the assistance of tablet translator service.

## 2024-08-15 NOTE — Progress Notes (Signed)
 Pt has stated allergy to betadine, dressing order states clean with betadine. Please clarify order.

## 2024-08-15 NOTE — TOC Initial Note (Signed)
 Transition of Care [facility]) - Initial/Assessment Note    Patient Details  Name: Mario [name] MRN: [id number] Date of Birth: 1970-09-21  Transition of Care [facility]) CM/SW Contact:    Corean ONEIDA Haddock, RN Phone Number: 08/15/2024, 3:56 PM  Clinical Narrative:                    CM to complete full assessment with patient Per HD coordinator patient uses [facility] to get to and from HD   Per MD patient has an appointment at Dr. Ona Roch PA, [place] on Thursday Jan 29 at 10:30. [facility]. [address], [place] [address], spoke with [facility]. the only time they go out of county is if they have medicaid approval. and since he doesn't have medicaid the would be unable to take him to his appointment   Patient Goals and CMS Choice            Expected Discharge Plan and Services                                              Prior Living Arrangements/Services                       Activities of Daily Living   ADL Screening (condition at time of admission) Independently performs ADLs?: Yes (appropriate for developmental age) Is the patient deaf or have difficulty hearing?: No Does the patient have difficulty seeing, even when wearing glasses/contacts?: Yes Does the patient have difficulty concentrating, remembering, or making decisions?: No  Permission Sought/Granted                  Emotional Assessment              Admission diagnosis:  Osteomyelitis [facility]) [M86.9] Patient Active Problem List   Diagnosis Date Noted   Chronic osteomyelitis of left foot (HCC) 08/11/2024   Osteomyelitis (HCC) 08/11/2024   Nausea and vomiting 10/16/2021   Ascites 10/16/2021   Generalized weakness 10/16/2021   Sepsis (HCC) 10/15/2021   Encounter to establish care 05/03/2020   Near syncope 04/15/2020   Soft tissue mass in left temple area    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   Tobacco abuse 04/16/2019   Anemia in ESRD (end-stage renal disease) (HCC)  04/16/2019   Neck pain, bilateral posterior 04/16/2019   Cervical spinal stenosis 04/13/2019   Cervical vertebral fracture (HCC) 04/13/2019   Neck pain 04/03/2019   Cervical radiculopathy 04/03/2019   Hypertensive urgency 04/01/2019   Intractable abdominal pain 05/01/2018   Gastroenteritis 10/02/2017   Abdominal pain, acute 08/21/2017   Malnutrition of moderate degree 08/21/2017   SBP (spontaneous bacterial peritonitis) (HCC) 06/09/2017   Erroneous encounter - disregard 03/04/2017   Hyperkalemia 11/27/2016   Acute hyperkalemia 10/28/2016   Chronic diastolic (congestive) heart failure (HCC) 10/13/2016   Cirrhosis (HCC) 10/13/2016   HTN (hypertension), malignant 10/13/2016   Abdominal pain 08/28/2016   Pulmonary edema 07/03/2016   Fluid overload 04/12/2016   Chest pain 04/11/2016   Scrotal infection 01/25/2015   ESRD on dialysis (HCC) 01/25/2015   PCP:  Lateef, Munsoor, MD Pharmacy:   [facility] [address]),  - [address] [place]) [place] [address] Phone: [phone] Fax: [phone]  [facility] [address] [place] [address]  Phone: [phone] Fax: [phone]  [facility] [address] [place] [address] Phone: [phone] Fax: [phone]  [facility] [address] [place] - [address] [place] [address] Phone: [phone] Fax: [phone]     Social Drivers of Health (SDOH) Social History: SDOH Screenings   Food Insecurity: Food Insecurity Present (08/11/2024)  Housing: High Risk (08/11/2024)  Transportation Needs: Unmet Transportation Needs (08/11/2024)  Utilities: At Risk (08/11/2024)  Social Connections: Moderately Isolated (08/11/2024)  Tobacco Use: High Risk (08/11/2024)   SDOH Interventions:     Readmission Risk Interventions     No data to display

## 2024-08-15 NOTE — Progress Notes (Signed)
 " Progress Note   Patient: Mario [name] FMW:[id number] DOB: 1971/06/03 DOA: 08/11/2024     3 DOS: the patient was seen and examined on 08/15/2024   Brief hospital course: Per H&P HPI  Mario [name] is a 54 y.o. adult with medical history significant of ESRD and HD TTS, HTN HLD, presented with worsening of left foot pain and nonhealing wound of left hand.   Patient complains about having left foot pain for 7+ days, quality getting worse, last 2 days even putting weight on left foot can trigger significant pain.  He pointed the pain mainly to the 5 toes especially the 1st and 5th toes, he has a small ulcer on lateral side of the pinky toe.  Denies any history of claudications.  Meantime he also complained about a unhealing wound of left hand, located on the extension side of the MCP joint, this has been a more chronic issue and he reported that he has not been able to bend left middle finger for several months.  Denied any fever or chills.  Last dialysis was last Tuesday.   ED Course: Afebrile, nontachycardic blood pressure 164/73 O2 saturation 100% on room air.  CTA of abdomen pelvis bifurcation lower extremity showed no significant occlusion or stenosis of major vessels but heavy calcification of medium and small vessels.  Blood work showed WBC 5.3 hemoglobin 12.5 BUN 40 creatinine 7.1 K4.5 bicarb 25.   Vascular surgery consulted.  Assessment and Plan: Rest ischemic pain V neuropathic pain  S/p LE angiography with angioplasty, per [facility] no further intervention recommended at this time. Per podiatry no surgical needs from their standpoint at this time.   Continue statin.  Continue DAPT Continue gabapentin  on HD days after HD   ESRD on HD TThS Continue HD per nephrology   Severe hepatic cirrhosis  Coagulopathy with elevated INR, thrombocytopenia, noted to have esophageal and paraesophageal varices as well as splenomegaly.  Patient with large volume ascites on ultrasound.   Patient states that he does not drink alcohol.  Hepatitis B is negative. MELDNA 25. S/p Paracentesis 4.4L of fluid were removed -Gets HD for volume control  -Lactulose  titrate to 2-3 bowel movements a day  -Check hepatitis C -Will place on beta blocker prior to d/c     L hand non healing ulcer Likely chronic osteo/septic arthritis at MCP of L 3rd digit. Hand surgery team recommended transfer to tertiary care center, they did not accept patient. Patient will need to follow-up in outpatient setting for management of this. Will discuss with ID in the a.m. regarding antibiotic recommendations   Hyper K resolved S/p HD   HTN Continue amlodipine    HLD Continue statin   Macrocytic anemia  B12 WNL,   Thrombocytopenia Appears chronic   Tobacco use disorder Will discuss cessation Nicotine  replacement therapy      Subjective: Continues to have significant pain in his left foot, worse at the site of his ulceration on the lateral aspect of the left foot. Having some nausea and vomiting after coming back from paracentesis. This occurred when he tried to eat.    Physical Exam: Vitals:   08/15/24 0819 08/15/24 0952 08/15/24 1100 08/15/24 1526  BP: (!) 157/70 135/77 125/65 (!) 140/70  Pulse: 67 71 62 61  Resp: 20   19  Temp: 98 F (36.7 C)   98.4 F (36.9 C)  TempSrc: Oral     SpO2: 96% 100% 100% 98%  Weight:      Height:  Constitutional: In no distress. Thin, disheveled.  Cardiovascular: Normal rate, regular rhythm. No lower extremity edema  Pulmonary: Non labored breathing on room air, no wheezing or rales.   Abdominal: Soft. Non distended and non tender Musculoskeletal: L foot, lateral aspect with eschar, dorsum of foot erythematous, TT mild palpation.    L hand ulcer  Neurological: Alert and oriented to person, place, and time. Non focal  Skin: Skin is warm and dry.    Data Reviewed: {Tip this will not be part of the note when signed- Document your  independent interpretation of telemetry tracing, EKG, lab, Radiology test or any other diagnostic tests. Add any new diagnostic test ordered today. (Optional):[id number]     Latest Ref Rng & Units 08/14/2024    6:39 AM 08/13/2024    6:36 AM 08/12/2024    6:00 AM  BMP  Glucose 70 - 99 mg/dL 79  80  72   BUN 6 - 20 mg/dL 25  36  46   Creatinine 0.61 - 1.24 mg/dL 4.82  3.42  1.83   Sodium 135 - 145 mmol/L 132  133  134   Potassium 3.5 - 5.1 mmol/L 4.2  4.9  5.4   Chloride 98 - 111 mmol/L 93  94  96   CO2 22 - 32 mmol/L 27  25  22    Calcium  8.9 - 10.3 mg/dL 9.8  9.5  9.7       Latest Ref Rng & Units 08/15/2024    2:07 PM 08/14/2024    6:39 AM 08/13/2024    6:36 AM  CBC  WBC 4.0 - 10.5 K/uL 3.9  3.7  4.3   Hemoglobin 13.0 - 17.0 g/dL 88.0  87.7  88.3   Hematocrit 39.0 - 52.0 % 36.7  37.4  36.1   Platelets 150 - 400 K/uL 87  90  91      Family Communication: None available  Disposition: Status is: Inpatient Remains inpatient appropriate because: management of foot pain   Planned Discharge Destination: Pending clinical course     Time spent: 35 minutes  Author: Alban Pepper, MD 08/15/2024 5:58 PM  For on call review [email or web address].  "

## 2024-08-15 NOTE — Progress Notes (Signed)
 "                                                                                                                                                                                               Palliative Care Progress Note, Assessment & Plan   Patient Name: Mario [name]       Date: 08/15/2024 DOB: Dec 04, 1970  Age: 54 y.o. MRN#: [id number] Attending Physician: Franchot Novel, MD Primary Care Physician: Lateef, Munsoor, MD Admit Date: 08/11/2024  Follow-up visit was completed with Communication Services: language interpreter  line with assistance from interpreter, Julio [phone], as patient is Spanish speaking only   Subjective: Patient endorses tremendous pain in foot and in abdomen from paracentesis earlier today.  He reports pain medication is not helping.  Complains of persistent nausea with emesis today that is not allowing him to eat.  HPI:  54 y.o. adult  with past medical history significant for ESRD on HD (T,T,S), HTN, HLD and cirrhosis and CHF. Patient presented to ED  from [facility] 08/11/2024 c/o pain and swelling to L foot x 5 days with associated swelling and discoloration. Pt denied trauma and rated pain 10/10 not relieved with Tylenol . He also complained of L hand wound that has been present for several weeks.  ED labs significant for Hgb 12.5, plts 105, BUN 40, creatinine 7.12, GFR 8, anion gap 16. Alk phos 188. aPTT 39, CRP 3.4.    ED vitals 164/73, HR 66, RR 18, SpO2 100% RA, 98.25F   Venous US  LLE: 1. No evidence of left lower extremity DVT. 2. Suspect  calcified peripheral vascular disease in the left lower extremity.   CTA Aortobifemoral: VASCULAR   1. No evidence of hemodynamically significant stenosis or occlusive disease in the inflow (aortoiliac) or outflow (femoropopliteal) segments. Evaluation of the runoff (infrageniculate) arteries is limited by severe medial arteriosclerosis. The circumferential calcification of the medium and small  artery walls creates streak artifact which severely limits evaluation of the internal lumens. Catheter directed angiography could be considered if clinically warranted. 2. Extensive peripheral calcified atherosclerotic plaque. 3. Extensive peripheral calcification of the inferior vena cava and bilateral iliac vessels, similar compared to prior. 4. No evidence of arterial aneurysm or dissection.   NON-VASCULAR   1. Severe hepatic cirrhosis with splenomegaly, esophageal and paraesophageal varices and moderate ascites. 2. No acute abnormality within the abdomen or pelvis. 3. Similar appearance of atrophic native kidneys.   MR L foot:  1. Small lateral erosion of the head of the 5th metatarsal with adjacent marrow edema and overlying soft tissue thinning/possible ulceration, raising concern for early osteomyelitis.  2. Trace subcutaneous edema along the dorsum of the forefoot, which can be seen with cellulitis. 3. Mild degenerative arthropathy at the 1st metatarsophalangeal joint.   MR L hand: 1. Abnormal marrow edema in the base of the proximal phalanx and in the head of the middle finger metacarpal, suspicious for active osteomyelitis, with overlying dorsal ulceration. 2. Erosion of the dorsal base of the proximal phalanx middle finger with anterior subluxation of the proximal phalanx with respect to the metacarpal head. 3. Low grade adjacent interosseous muscle and subcutaneous edema. Adjacent to the distal 3rd digit metacarpal. 4. Motion artifact reduces diagnostic sensitivity and specificity.   TRH was consulted for admission and management of chronic osteomyelitis of L foot and L hand nonhealing ulcer and ESRD on HD (TTS).   Palliative medicine team was consulted to assist with goals of care conversations in the setting of ESRD on HD and severe hepatic cirrhosis.     Summary of counseling/coordination of care Chart reviewed:   Labs: Continuing hyponatremia.  Poor renal  function expected in HD patient.  WBC 3.9 today.  Hgb stable at 11.9    Latest Ref Rng & Units 08/15/2024    2:07 PM 08/14/2024    6:39 AM 08/13/2024    6:36 AM  CBC  WBC 4.0 - 10.5 K/uL 3.9  3.7  4.3   Hemoglobin 13.0 - 17.0 g/dL 88.0  87.7  88.3   Hematocrit 39.0 - 52.0 % 36.7  37.4  36.1   Platelets 150 - 400 K/uL 87  90  91       Latest Ref Rng & Units 08/14/2024    6:39 AM 08/13/2024    6:36 AM 08/12/2024    6:00 AM  CMP  Glucose 70 - 99 mg/dL 79  80  72   BUN 6 - 20 mg/dL 25  36  46   Creatinine 0.61 - 1.24 mg/dL 4.82  3.42  1.83   Sodium 135 - 145 mmol/L 132  133  134   Potassium 3.5 - 5.1 mmol/L 4.2  4.9  5.4   Chloride 98 - 111 mmol/L 93  94  96   CO2 22 - 32 mmol/L 27  25  22    Calcium  8.9 - 10.3 mg/dL 9.8  9.5  9.7      Vitals: Blood pressure (!) 140/70, pulse 61, temperature 98.4 F (36.9 C), resp. rate 19, height 5' 2.99 (1.6 m), weight 62.2 kg, SpO2 98%.   Progress notes: Reviewed progress notes from TRH, nephrology, radiology, vascular surgery, TOC and nursing staff.  Imaging: Paracentesis performed today removing 4.4 L straw-colored peritoneal fluid.  MAR: Discussed with Dr. Franchot recommendation for modification for pain management to fentanyl  IV and renally dosed gabapentin .    ACP documents: No ACP documentation to review  After reviewing the patient's chart and assessing the patient at bedside, I spoke with patient in regards to symptom management and goals of care.   Ill-appearing, older than stated age male lying in bed.  He acknowledges my presence but does not open his eyes during visit.  When asked about pain, patient points to foot and shows right lower abdomen with bandage from paracentesis earlier today.  He appears uncomfortable and constantly moans and repositions in bed from pain.  Physical Exam Vitals and nursing note reviewed.  Constitutional:      General: She is not in acute distress.    Appearance: She is ill-appearing.  HENT:     Head:  Normocephalic and  atraumatic.  Pulmonary:     Effort: Pulmonary effort is normal. No respiratory distress.  Abdominal:     Comments: C/D/I dressing to right lower quadrant from paracentesis earlier today  Musculoskeletal:     Right lower leg: No edema.     Left lower leg: Edema present.  Skin:    General: Skin is warm and dry.     Comments: Healing wound to dorsum of left hand  Neurological:     Mental Status: She is alert. Mental status is at baseline.            Recommendations/Plan: FULL CODE status as previously documented    Continue current supportive interventions PMT collaborating with TRH for pain control Following TOC for disposition  PMT will continue to follow for pain management  Discussed plan of care with Dr. Franchot and primary RN  I personally spent a total of 50 minutes in the care of the patient today including preparing to see the patient, getting/reviewing separately obtained history, performing a medically appropriate exam/evaluation, counseling and educating, referring and communicating with other health care professionals, documenting clinical information in the EHR, independently interpreting results, communicating results, and coordinating care.    Devere Sacks, AMANDA [facility] Palliative Medicine Team  08/15/2024 9:52 AM  Office [phone]  Pager [phone]     "

## 2024-08-15 NOTE — Progress Notes (Addendum)
 Subjective: His left foot pain is most bothersome for him this morning.  He does complain of continued left hand pain when prompted.  Hand pain is about the same as previously.   Objective: Vital signs in last 24 hours: Temp:  [97.7 F (36.5 C)-99.1 F (37.3 C)] 99.1 F (37.3 C) (01/12 0548) Pulse Rate:  [64-69] 64 (01/12 0548) Resp:  [16-20] 20 (01/12 0548) BP: (139-161)/(71-90) 153/90 (01/12 0548) SpO2:  [99 %-100 %] 100 % (01/12 0548)  Intake/Output from previous day: 01/11 0701 - 01/12 0700 In: 240 [P.O.:240] Out: -  Intake/Output this shift: No intake/output data recorded.  Recent Labs    08/12/24 1100 08/13/24 0636 08/14/24 0639  HGB 11.9* 11.6* 12.2*   Recent Labs    08/13/24 0636 08/14/24 0639  WBC 4.3 3.7*  RBC 3.44* 3.66*  HCT 36.1* 37.4*  PLT 91* 90*   Recent Labs    08/13/24 0636 08/14/24 0639  NA 133* 132*  K 4.9 4.2  CL 94* 93*  CO2 25 27  BUN 36* 25*  CREATININE 6.57* 5.17*  GLUCOSE 80 79  CALCIUM  9.5 9.8   Recent Labs    08/13/24 0937  INR 1.3*    Physical Exam: LUE: Dorsal ulcerated hand wound over the middle finger MCP joint with small sinus.  No noted extension of the middle finger.  Very limited flexion at the MCP joint.  Subjective numbness circumferentially about the dorsal and volar aspect of the middle finger.  Sensation intact to light touch to the other digits.  Unable to make a full composite fist.  The digits are warm and well-perfused.  X-rays: As previously described; appearance of chronic osteomyelitic changes to the middle finger MCP joint with destruction of the metacarpal head and proximal phalanx base and subluxation of the proximal phalanx relative to the metacarpal.  Assessment: Left middle finger metacarpal phalangeal joint chronic septic arthritis and osteomyelitis with overlying ulcerated skin wound  Plan: - Given the complex nature of this injury and the fact that he will likely need plastic surgery services  for coverage after a thorough debridement of the joint and surrounding soft tissues, I continue to recommend that he be transferred to a tertiary care center with plastic surgery services.  Alternatively, he may benefit from a ray resection but, given the dorsal hand wound, he still may need plastic surgery services for wound coverage - Ultimately, due to his many medical comorbidities and the complexity of this case, he would be best served to be taken care of at a tertiary care center - I did talk with a hand surgeon with [facility] who offered to see the patient in the outpatient setting and set up an elective surgery likely comprising of a ray resection with soft tissue realignment.  I discussed this with the patient this morning and he feels that would be very difficult for him to make such an appointment due to his many social challenges.  Therefore, the primary team will continue to work on trying to get him transferred - Antibiotics as per primary - He would likely benefit from occupational therapy services while admitted to help work on range of motion and pain control of the hand - If needed, I am available to perform an I&D of the joint tomorrow.  However, I am of the opinion that he would be best served to receive all care regarding his hand and his single institution  Spanish interpreter [place] [phone] used  Jackquline GORMAN Barrack 08/15/2024, 7:17  AM

## 2024-08-16 ENCOUNTER — Encounter
Admission: EM | Disposition: A | Discharge status: Home / Self Care | Payer: Self-pay | Source: Ambulatory Visit | Attending: Student

## 2024-08-16 ENCOUNTER — Other Ambulatory Visit: Payer: Self-pay

## 2024-08-16 DIAGNOSIS — I739 Peripheral vascular disease, unspecified: Secondary | ICD-10-CM

## 2024-08-16 DIAGNOSIS — L97529 Non-pressure chronic ulcer of other part of left foot with unspecified severity: Secondary | ICD-10-CM

## 2024-08-16 DIAGNOSIS — S61502A Unspecified open wound of left wrist, initial encounter: Secondary | ICD-10-CM

## 2024-08-16 DIAGNOSIS — N186 End stage renal disease: Secondary | ICD-10-CM

## 2024-08-16 DIAGNOSIS — L089 Local infection of the skin and subcutaneous tissue, unspecified: Secondary | ICD-10-CM

## 2024-08-16 DIAGNOSIS — Z992 Dependence on renal dialysis: Secondary | ICD-10-CM

## 2024-08-16 DIAGNOSIS — F17210 Nicotine dependence, cigarettes, uncomplicated: Secondary | ICD-10-CM

## 2024-08-16 DIAGNOSIS — D631 Anemia in chronic kidney disease: Secondary | ICD-10-CM

## 2024-08-16 LAB — COMPREHENSIVE METABOLIC PANEL WITH GFR
ALT: 10 U/L (ref 0–44)
AST: 41 U/L (ref 15–41)
Albumin: 3.4 g/dL — ABNORMAL LOW (ref 3.5–5.0)
Alkaline Phosphatase: 171 U/L — ABNORMAL HIGH (ref 38–126)
Anion gap: 16 — ABNORMAL HIGH (ref 5–15)
BUN: 40 mg/dL — ABNORMAL HIGH (ref 6–20)
CO2: 29 mmol/L (ref 22–32)
Calcium: 9.5 mg/dL (ref 8.9–10.3)
Chloride: 90 mmol/L — ABNORMAL LOW (ref 98–111)
Creatinine, Ser: 7.7 mg/dL — ABNORMAL HIGH (ref 0.61–1.24)
GFR, Estimated: 8 mL/min — ABNORMAL LOW
Glucose, Bld: 87 mg/dL (ref 70–99)
Potassium: 4.2 mmol/L (ref 3.5–5.1)
Sodium: 135 mmol/L (ref 135–145)
Total Bilirubin: 1 mg/dL (ref 0.0–1.2)
Total Protein: 6.6 g/dL (ref 6.5–8.1)

## 2024-08-16 LAB — CULTURE, BLOOD (ROUTINE X 2)
Culture: NO GROWTH
Culture: NO GROWTH
Special Requests: ADEQUATE

## 2024-08-16 LAB — FERRITIN: Ferritin: 1130 ng/mL — ABNORMAL HIGH (ref 24–336)

## 2024-08-16 LAB — CBC
HCT: 38.7 % — ABNORMAL LOW (ref 39.0–52.0)
Hemoglobin: 12.7 g/dL — ABNORMAL LOW (ref 13.0–17.0)
MCH: 33.2 pg (ref 26.0–34.0)
MCHC: 32.8 g/dL (ref 30.0–36.0)
MCV: 101 fL — ABNORMAL HIGH (ref 80.0–100.0)
Platelets: 86 K/uL — ABNORMAL LOW (ref 150–400)
RBC: 3.83 MIL/uL — ABNORMAL LOW (ref 4.22–5.81)
RDW: 14.6 % (ref 11.5–15.5)
WBC: 4.4 K/uL (ref 4.0–10.5)
nRBC: 0 % (ref 0.0–0.2)

## 2024-08-16 LAB — IRON AND TIBC
Iron: 85 ug/dL (ref 45–182)
Saturation Ratios: 49 % — ABNORMAL HIGH (ref 17.9–39.5)
TIBC: 175 ug/dL — ABNORMAL LOW (ref 250–450)
UIBC: 90 ug/dL

## 2024-08-16 LAB — PTH, INTACT AND CALCIUM
Calcium, Total (PTH): 9.7 mg/dL (ref 8.7–10.2)
PTH: 266 pg/mL — ABNORMAL HIGH (ref 15–65)

## 2024-08-16 SURGERY — IRRIGATION AND DEBRIDEMENT WOUND
Anesthesia: Choice | Laterality: Left

## 2024-08-16 MED ORDER — LACTULOSE ENEMA
300.0000 mL | Freq: Once | ORAL | Status: AC
  Administered 2024-08-16: 300 mL via RECTAL
  Filled 2024-08-16: qty 300

## 2024-08-16 NOTE — Progress Notes (Signed)
 " [facility]  ROUNDING NOTE   Subjective:   Mario [name] is a 54 y.o. male with past medical history including hypertension, neuropathy, chronic liver cirrhosis, portal hypertension, and end-stage renal disease on hemodialysis. Patient seen in ED for foot pain and swelling. He has been admitted for Osteomyelitis [facility]) [M86.9] Patient is known to our practice and receives outpatient dialysis treatments at [facility] on a TTS schedule, overseen by [employer].  Update: Patient seen and evaluated during dialysis   HEMODIALYSIS FLOWSHEET:  Blood Flow Rate (mL/min): 399 mL/min Arterial Pressure (mmHg): -205.24 mmHg Venous Pressure (mmHg): 199.79 mmHg TMP (mmHg): -1.21 mmHg Ultrafiltration Rate (mL/min): 543 mL/min Dialysate Flow Rate (mL/min): 299 ml/min Bolus Amount (mL): 200 mL  Left foot pain   Objective:  Vital signs in last 24 hours:  Temp:  [97.7 F (36.5 C)-98.4 F (36.9 C)] 97.7 F (36.5 C) (01/13 0915) Pulse Rate:  [59-67] 67 (01/13 1000) Resp:  [12-20] 12 (01/13 1000) BP: (125-154)/(57-76) 138/57 (01/13 1000) SpO2:  [94 %-100 %] 94 % (01/13 1000) Weight:  [60 kg] 60 kg (01/13 0915)  Weight change:  Filed Weights   08/12/24 1335 08/13/24 1608 08/16/24 0915  Weight: 66.8 kg 62.2 kg 60 kg    Intake/Output: No intake/output data recorded.   Intake/Output this shift:  Total I/O In: 240 [P.O.:240] Out: -   Physical Exam: General: NAD  Head: Normocephalic  Eyes: Anicteric  Lungs:  normal effort, on room air  Heart: Regular rate  Abdomen:  Soft, nontender  Extremities:  No peripheral edema  Neurologic: Awake, alert  Skin: Warm,dry  Access: Lt AVF    Basic Metabolic Panel: Recent Labs  Lab 08/11/24 0726 08/12/24 0600 08/13/24 0636 08/14/24 0639 08/16/24 0535  NA 135 134* 133* 132* 135  K 4.5 5.4* 4.9 4.2 4.2  CL 94* 96* 94* 93* 90*  CO2 25 22 25 27 29   GLUCOSE 73 72 80 79 87  BUN 40* 46* 36* 25* 40*   CREATININE 7.12* 8.16* 6.57* 5.17* 7.70*  CALCIUM  9.8 9.7 9.5 9.8 9.5  PHOS  --   --  6.2* 5.3*  --     Liver Function Tests: Recent Labs  Lab 08/11/24 0726 08/16/24 0535  AST 36 41  ALT 13 10  ALKPHOS 188* 171*  BILITOT 0.8 1.0  PROT 7.6 6.6  ALBUMIN 4.0 3.4*   No results for input(s): LIPASE, AMYLASE in the last 168 hours. No results for input(s): AMMONIA in the last 168 hours.  CBC: Recent Labs  Lab 08/11/24 0726 08/12/24 1100 08/13/24 0636 08/14/24 0639 08/15/24 1407 08/16/24 0535  WBC 5.3 4.7 4.3 3.7* 3.9* 4.4  NEUTROABS 3.8  --  3.0  --  2.7  --   HGB 12.5* 11.9* 11.6* 12.2* 11.9* 12.7*  HCT 39.0 36.6* 36.1* 37.4* 36.7* 38.7*  MCV 104.0* 103.4* 104.9* 102.2* 101.7* 101.0*  PLT 105* 102* 91* 90* 87* 86*    Cardiac Enzymes: No results for input(s): CKTOTAL, CKMB, CKMBINDEX, TROPONINI in the last 168 hours.  BNP: Invalid input(s): POCBNP  CBG: No results for input(s): GLUCAP in the last 168 hours.  Microbiology: Results for orders placed or performed during the hospital encounter of 08/11/24  Blood culture (routine x 2)     Status: None   Collection Time: 08/11/24  7:15 AM   Specimen: BLOOD  Result Value Ref Range Status   Specimen Description BLOOD RIGHT ANTECUBITAL  Final   Special Requests   Final  BOTTLES DRAWN AEROBIC AND ANAEROBIC Blood Culture adequate volume   Culture   Final    NO GROWTH 5 DAYS Performed at [facility], [address] [place], [place] [address]    Report Status 08/16/2024 FINAL  Final  Blood culture (routine x 2)     Status: None   Collection Time: 08/11/24  7:26 AM   Specimen: BLOOD  Result Value Ref Range Status   Specimen Description BLOOD BLOOD RIGHT HAND  Final   Special Requests   Final    BOTTLES DRAWN AEROBIC AND ANAEROBIC Blood Culture results may not be optimal due to an inadequate volume of blood received in culture bottles   Culture   Final    NO GROWTH 5 DAYS Performed at  [facility], [address], [place], [place] [address]    Report Status 08/16/2024 FINAL  Final  MRSA Next Gen by PCR, Nasal     Status: None   Collection Time: 08/11/24  4:00 PM   Specimen: Nasal Mucosa; Nasal Swab  Result Value Ref Range Status   MRSA by PCR Next Gen NOT DETECTED NOT DETECTED Final    Comment: (NOTE) The GeneXpert MRSA Assay (FDA approved for NASAL specimens only), is one component of a comprehensive MRSA colonization surveillance program. It is not intended to diagnose MRSA infection nor to guide or monitor treatment for MRSA infections. Test performance is not FDA approved in patients less than 48 years old. Performed at [facility], [address] [place], [place] [address]     Coagulation Studies: No results for input(s): LABPROT, INR in the last 72 hours.   Urinalysis: No results for input(s): COLORURINE, LABSPEC, PHURINE, GLUCOSEU, HGBUR, BILIRUBINUR, KETONESUR, PROTEINUR, UROBILINOGEN, NITRITE, LEUKOCYTESUR in the last 72 hours.  Invalid input(s): APPERANCEUR    Imaging: US  Paracentesis Result Date: 08/15/2024 INDICATION: 54 year old male. History of CHF, end-stage renal disease and cirrhosis. Found to have ascites. Request is for therapeutic and diagnostic paracentesis. EXAM: ULTRASOUND GUIDED THERAPEUTIC AND DIAGNOSTIC RIGHT-SIDED PARACENTESIS MEDICATIONS: Lidocaine  1% 10 mL COMPLICATIONS: None immediate. PROCEDURE: Informed written consent was obtained from the patient after a discussion of the risks, benefits and alternatives to treatment. A timeout was performed prior to the initiation of the procedure. Initial ultrasound scanning demonstrates a moderate amount of ascites within the right lower abdominal quadrant. The right lower abdomen was prepped and draped in the usual sterile fashion. 1% lidocaine  was used for local anesthesia. Following this, a 19 gauge, 7-cm, Yueh catheter was introduced. An  ultrasound image was saved for documentation purposes. The paracentesis was performed. The catheter was removed and a dressing was applied. The patient tolerated the procedure well without immediate post procedural complication. FINDINGS: A total of approximately 4.4 L of straw color fluid was removed. Samples were sent to the laboratory as requested by the clinical team. IMPRESSION: Successful ultrasound-guided therapeutic and diagnostic right-sided paracentesis yielding 4.4 liters of peritoneal fluid. Performed by Delon Beagle NP PLAN: If the patient eventually requires >/=2 paracenteses in a 30 day period, candidacy for formal evaluation by the [facility] will be assessed. Electronically Signed   By: CHRISTELLA.  Shick M.D.   On: 08/15/2024 11:19     Medications:      amLODipine   5 mg Oral Daily   aspirin  EC  81 mg Oral Daily   atorvastatin   10 mg Oral Daily   clopidogrel   75 mg Oral Daily   feeding supplement (NEPRO CARB STEADY)  237  mL Oral BID BM   gabapentin   200 mg Oral Q T,Th,Sat-1800   heparin   5,000 Units Subcutaneous Q12H   lactulose   20 g Oral QID   nicotine   14 mg Transdermal Daily   polyethylene glycol  17 g Oral Daily   senna-docusate  1 tablet Oral QHS   sevelamer  carbonate  800 mg Oral TID WC   Vitamin D  (Ergocalciferol )  50,000 Units Oral Q7 days   acetaminophen  **OR** acetaminophen , diphenhydrAMINE , fentaNYL  (SUBLIMAZE ) injection, HYDROmorphone  **OR** HYDROmorphone , ondansetron  **OR** ondansetron  (ZOFRAN ) IV, prednisoLONE  acetate, prochlorperazine , traZODone , triamcinolone  cream  Assessment/ Plan:  Ms. Jamarea Selner is a 54 y.o.  adult with past medical history including hypertension, neuropathy, chronic liver cirrhosis, portal hypertension, and end-stage renal disease on hemodialysis. Patient seen in ED for foot pain and swelling. He has been admitted for Osteomyelitis (HCC) [M86.9]  [facility]  [place]/TTS/Lt AVF  End stage renal disease on hemodialysis. Receiving dialysis today, UF 1L as tolerated. Next treatment scheduled for Thursday.   2. Left foot pain, primary team suspects claudication. Angiogram on 1/9. No further vascular surgery intervention recommended. Awaiting ID antibiotic recommendations.   3. Anemia of chronic kidney disease Lab Results  Component Value Date   HGB 12.7 (L) 08/16/2024    Hgb stable, no need for ESA  4. Secondary Hyperparathyroidism: with outpatient labs: None  Lab Results  Component Value Date   PTH 1,236 (H) 04/15/2020   CALCIUM  9.5 08/16/2024   PHOS 5.3 (H) 08/14/2024    Calcium  and phos within optimal range.   LOS: 4 Hatim Homann 1/13/202610:22 AM   "

## 2024-08-16 NOTE — Consult Note (Signed)
 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 male with a history of ESRD, alcohol induced cirrhosis, HTN,  Presenting with worsening left foot pain and non healing wound left hand Pt is originally from [place], been here since 2008, has been on dilaysis, has a had wound rt middle finger BCP joint for a long time Unable to straighten the finger Also having painful left foot lateral ulcer Feels feverish Lives with friend Denies trauma Smoker Quit alcohol once he was diagnosed with cirrhosis  Vitals in the ED  08/11/24 06:42  BP 164/73 !  Temp 98.3 F (36.8 C)  Pulse Rate 66  Resp 18  SpO2 100 %     Latest Reference Range & Units 08/11/24 07:26  WBC 4.0 - 10.5 K/uL 5.3  Hemoglobin 13.0 - 17.0 g/dL 87.4 (L)  HCT 60.9 - 47.9 % 39.0  Platelets 150 - 400 K/uL 105 (L)  Creatinine 0.61 - 1.24 mg/dL 2.87 (H)   BC sent on 03/04/72 CTA abd/pelvis- cirrhosis liver Calcified atherosclerosis of vessels lower extremities     Past Medical History:  Diagnosis Date   Abdominal pain 08/28/2016   Acute hyperkalemia 10/28/2016   Chest pain 04/11/2016   Chronic combined systolic and diastolic CHF (congestive heart failure) (HCC)    Cirrhosis (HCC)    Dialysis patient    Edentulous    ESRD (end stage renal disease) on dialysis (HCC) 01/25/2015   Fluid overload 04/12/2016   HTN (hypertension) 10/13/2016   Hyperkalemia 11/27/2016   Hypertension    Mitral regurgitation    Pulmonary edema 07/03/2016   Renal disorder    Renal insufficiency    Scrotal infection 01/25/2015    Past Surgical History:  Procedure Laterality Date   ANTERIOR CERVICAL CORPECTOMY N/A 04/05/2019   Procedure: ANTERIOR CERVICAL CORPECTOMY Cervical six and Cervical seven with strut graft fusion from  Cervical five-Thoracic one   ;  Surgeon: Louis Shove, MD;  Location: [facility] OR;  Service: Neurosurgery;  Laterality: N/A;   AV FISTULA PLACEMENT     LOWER EXTREMITY ANGIOGRAPHY Left 08/12/2024   Procedure: Lower Extremity Angiography;  Surgeon: Marea Selinda RAMAN, MD;  Location: [facility] INVASIVE CV LAB;  Service: Cardiovascular;  Laterality: Left;   POSTERIOR CERVICAL FUSION/FORAMINOTOMY N/A 04/05/2019   Procedure: Posterior cervical fusion utilizing segmental instrumentation from Cervical five-Thoracic one ;  Surgeon: Louis Shove, MD;  Location: [facility] OR;  Service: Neurosurgery;  Laterality: N/A;    Social History   Socioeconomic History   Marital status: Single    Spouse name: Not on file   Number of children: Not on file   Years of education: Not on file   Highest education level: Not on file  Occupational History   Not on file  Tobacco Use   Smoking status: Every Day    Current packs/day: 0.25    Average packs/day: 0.3 packs/day for 25.0 years (6.3 ttl pk-yrs)    Types: Cigarettes   Smokeless tobacco: Never  Substance and Sexual Activity   Alcohol use: No    Comment: pt denies   Drug use: No   Sexual activity: Not Currently  Other Topics Concern   Not on file  Social History Narrative   Not on file   Social Drivers of Health   Tobacco Use: High Risk (08/11/2024)   Patient History  Smoking Tobacco Use: Every Day    Smokeless Tobacco Use: Never    Passive Exposure: Not on file  Financial Resource Strain: Not on file  Food Insecurity: Food Insecurity Present (08/11/2024)   Epic    Worried About Programme Researcher, Broadcasting/film/video in the Last Year: Often true    [employer] of Food in the Last Year: Often true  Transportation Needs: Unmet Transportation Needs (08/11/2024)   Epic    Lack of Transportation (Medical): Yes    Lack of Transportation (Non-Medical): Yes  Physical Activity: Not on file  Stress: Not on file  Social Connections: Moderately Isolated (08/11/2024)   Social Connection and Isolation Panel     Frequency of Communication with Friends and Family: Never    Frequency of Social Gatherings with Friends and Family: Never    Attends Religious Services: 1 to 4 times per year    Active Member of [employer] or Organizations: Yes    Attends Banker Meetings: More than 4 times per year    Marital Status: Never married  Intimate Partner Violence: Not At Risk (08/11/2024)   Epic    Fear of Current or Ex-Partner: No    Emotionally Abused: No    Physically Abused: No    Sexually Abused: No  Depression (PHQ2-9): Not on file  Alcohol Screen: Not on file  Housing: High Risk (08/11/2024)   Epic    Unable to Pay for Housing in the Last Year: Yes    Number of Times Moved in the Last Year: 0    Homeless in the Last Year: No  Utilities: At Risk (08/11/2024)   Epic    Threatened with loss of utilities: Yes  Health Literacy: Not on file    Family History  Problem Relation Age of Onset   Kidney failure Father    Diabetes Mother    Allergies[1] I? Current Facility-Administered Medications  Medication Dose Route Frequency Provider Last Rate Last Admin   acetaminophen  (TYLENOL ) tablet 650 mg  650 mg Oral Q6H PRN Dew, Jason S, MD       Or   acetaminophen  (TYLENOL ) suppository 650 mg  650 mg Rectal Q6H PRN Dew, Jason S, MD       amLODipine  (NORVASC ) tablet 5 mg  5 mg Oral Daily Dew, Jason S, MD   5 mg at 08/15/24 0840   aspirin  EC tablet 81 mg  81 mg Oral Daily Dew, Jason S, MD   81 mg at 08/16/24 1344   atorvastatin  (LIPITOR) tablet 10 mg  10 mg Oral Daily Dew, Jason S, MD   10 mg at 08/16/24 1344   clopidogrel  (PLAVIX ) tablet 75 mg  75 mg Oral Daily Dew, Jason S, MD   75 mg at 08/16/24 1344   diphenhydrAMINE  (BENADRYL ) capsule 25 mg  25 mg Oral Q6H PRN Mansy, Jan A, MD   25 mg at 08/12/24 2145   feeding supplement (NEPRO CARB STEADY) liquid 237 mL  237 mL Oral BID BM Dew, Jason S, MD   237 mL at 08/15/24 1347   fentaNYL  (SUBLIMAZE ) injection 12.5 mcg  12.5 mcg Intravenous Q2H PRN Franchot Novel, MD       gabapentin  (NEURONTIN ) capsule 200 mg  200 mg Oral Q T,Th,Sat-1800 Franchot Novel, MD   200 mg at 08/16/24 1716   heparin  injection 5,000 Units  5,000 Units Subcutaneous Q12H Dew, Jason S, MD   5,000 Units at 08/16/24 1344   HYDROmorphone  (DILAUDID ) tablet 2 mg  2 mg  Oral Q4H PRN Franchot Novel, MD       Or   HYDROmorphone  (DILAUDID ) tablet 4 mg  4 mg Oral Q4H PRN Franchot Novel, MD   4 mg at 08/16/24 1716   nicotine  (NICODERM CQ  - dosed in mg/24 hours) patch 14 mg  14 mg Transdermal Daily Franchot Novel, MD   14 mg at 08/16/24 1343   ondansetron  (ZOFRAN ) tablet 4 mg  4 mg Oral Q6H PRN Mansy, Jan A, MD       Or   ondansetron  (ZOFRAN ) injection 4 mg  4 mg Intravenous Q4H PRN Mansy, Jan A, MD   4 mg at 08/15/24 1250   polyethylene glycol (MIRALAX  / GLYCOLAX ) packet 17 g  17 g Oral Daily Franchot Novel, MD   17 g at 08/16/24 1343   prednisoLONE  acetate (PRED FORTE ) 1 % ophthalmic suspension 1-2 drop  1-2 drop Right Eye Q4H PRN Marea Selinda RAMAN, MD       prochlorperazine  (COMPAZINE ) tablet 5 mg  5 mg Oral Q6H PRN Franchot Novel, MD   5 mg at 08/16/24 1343   senna-docusate (Senokot-S) tablet 1 tablet  1 tablet Oral QHS Franchot Novel, MD   1 tablet at 08/15/24 2144   sevelamer  carbonate (RENVELA ) tablet 800 mg  800 mg Oral TID WC Levorn Ramonita SQUIBB, NP   800 mg at 08/16/24 1716   traZODone  (DESYREL ) tablet 25 mg  25 mg Oral QHS PRN Marea Selinda RAMAN, MD   25 mg at 08/13/24 2053   triamcinolone  cream (KENALOG ) 0.1 % cream   Topical TID PRN Franchot Novel, MD   1 Application at 08/13/24 0518   Vitamin D  (Ergocalciferol ) (DRISDOL ) 1.25 MG (50000 UNIT) capsule 50,000 Units  50,000 Units Oral Q7 days Franchot Novel, MD   50,000 Units at 08/16/24 1343     Abtx:  Anti-infectives (From admission, onward)    Start     Dose/Rate Route Frequency Ordered Stop   08/12/24 1430  ceFAZolin  (ANCEF ) IVPB 1 g/50 mL premix        1 g 100 mL/hr over 30 Minutes Intravenous  Once  08/12/24 1342 08/12/24 1624   08/11/24 0800  cefTRIAXone  (ROCEPHIN ) 1 g in sodium chloride  0.9 % 100 mL IVPB        1 g 200 mL/hr over 30 Minutes Intravenous  Once 08/11/24 0752 08/11/24 0831       REVIEW OF SYSTEMS:  Const: subjective fever, negative chills, negative weight loss Eyes: negative diplopia or visual changes, negative eye pain ENT: negative coryza, negative sore throat Resp: negative cough, hemoptysis, dyspnea Cards: negative for chest pain, palpitations, lower extremity edema GU: negative for frequency, dysuria and hematuria GI: Negative for abdominal pain, diarrhea, bleeding, constipation Skin: stretched and shiny over left foot- painful  Heme: negative for easy bruising and gum/nose bleeding MS: negative for myalgias, arthralgias, back pain and muscle weakness Neurolo:negative for headaches, dizziness, vertigo, memory problems  Psych: negative for feelings of anxiety, depression  Endocrine: negative for thyroid, diabetes Allergy/Immunology- negative for any medication or food allergies ? Pertinent Positives include : Objective:  VITALS:  BP 133/65 (BP Location: Right Arm)   Pulse 60   Temp 98.1 F (36.7 C)   Resp 19   Ht 5' 2.99 (1.6 m)   Wt 59 kg   SpO2 100%   BMI 23.05 kg/m  LDA Foley Central line Other drainage tubes PHYSICAL EXAM:  General: Alert, cooperative, no distress, appears stated age.  Head: Normocephalic, without obvious abnormality, atraumatic.  Eyes: Conjunctivae clear, anicteric sclerae. Pupils are equal ENT Nares normal. No drainage or sinus tenderness. Lips, mucosa, and tongue normal. No Thrush Neck: Supple, symmetrical, no adenopathy, thyroid: non tender no carotid bruit and no JVD. Back: No CVA tenderness. Lungs: Clear to auscultation bilaterally. No Wheezing or Rhonchi. No rales. Heart: Regular rate and rhythm, no murmur, rub or gallop. Abdomen: Soft, non-tender,not distended. Bowel sounds normal. No masses Extremities: left  arm fistula Left hand middle MCP joint wound - not wet, has scar tissue  Skin: No rashes or lesions. Or bruising Lymph: Cervical, supraclavicular normal. Neurologic: Grossly non-focal Pertinent Labs Lab Results CBC    Component Value Date/Time   WBC 4.4 08/16/2024 0535   RBC 3.83 (L) 08/16/2024 0535   HGB 12.7 (L) 08/16/2024 0535   HCT 38.7 (L) 08/16/2024 0535   PLT 86 (L) 08/16/2024 0535   MCV 101.0 (H) 08/16/2024 0535   MCH 33.2 08/16/2024 0535   MCHC 32.8 08/16/2024 0535   RDW 14.6 08/16/2024 0535   LYMPHSABS 0.5 (L) 08/15/2024 1407   MONOABS 0.5 08/15/2024 1407   EOSABS 0.1 08/15/2024 1407   BASOSABS 0.0 08/15/2024 1407       Latest Ref Rng & Units 08/16/2024    5:35 AM 08/14/2024    6:39 AM 08/13/2024    6:36 AM  CMP  Glucose 70 - 99 mg/dL 87  79  80   BUN 6 - 20 mg/dL 40  25  36   Creatinine 0.61 - 1.24 mg/dL 2.29  4.82  3.42   Sodium 135 - 145 mmol/L 135  132  133   Potassium 3.5 - 5.1 mmol/L 4.2  4.2  4.9   Chloride 98 - 111 mmol/L 90  93  94   CO2 22 - 32 mmol/L 29  27  25    Calcium  8.9 - 10.3 mg/dL 9.5  9.7    9.8  9.5   Total Protein 6.5 - 8.1 g/dL 6.6     Total Bilirubin 0.0 - 1.2 mg/dL 1.0     Alkaline Phos 38 - 126 U/L 171     AST 15 - 41 U/L 41     ALT 0 - 44 U/L 10         Microbiology: Recent Results (from the past 240 hours)  Blood culture (routine x 2)     Status: None   Collection Time: 08/11/24  7:15 AM   Specimen: BLOOD  Result Value Ref Range Status   Specimen Description BLOOD RIGHT ANTECUBITAL  Final   Special Requests   Final    BOTTLES DRAWN AEROBIC AND ANAEROBIC Blood Culture adequate volume   Culture   Final    NO GROWTH 5 DAYS Performed at [facility], [address] [place], [place] [address]    Report Status 08/16/2024 FINAL  Final  Blood culture (routine x 2)     Status: None   Collection Time: 08/11/24  7:26 AM   Specimen: BLOOD  Result Value Ref Range Status   Specimen Description BLOOD BLOOD RIGHT HAND   Final   Special Requests   Final    BOTTLES DRAWN AEROBIC AND ANAEROBIC Blood Culture results may not be optimal due to an inadequate volume of blood received in culture bottles   Culture   Final    NO GROWTH 5 DAYS Performed at [facility], [address] [place], [place] [address]    Report Status 08/16/2024 FINAL  Final  MRSA Next Gen by  PCR, Nasal     Status: None   Collection Time: 08/11/24  4:00 PM   Specimen: Nasal Mucosa; Nasal Swab  Result Value Ref Range Status   MRSA by PCR Next Gen NOT DETECTED NOT DETECTED Final    Comment: (NOTE) The GeneXpert MRSA Assay (FDA approved for NASAL specimens only), is one component of a comprehensive MRSA colonization surveillance program. It is not intended to diagnose MRSA infection nor to guide or monitor treatment for MRSA infections. Test performance is not FDA approved in patients less than 60 years old. Performed at [facility], [address] [place], [place] [address]      Patient has: []  acute illness w/systemic sxs  [mod] []  illness posing risk to life or function  [high]  I reviewed:  (3+) []  primary team note [x]  consultant note(s) []  procedure/op note(s) []  micro result(s)   []  CBC results []  chemistry results []  radiology report(s) []  nursing note(s)  I independently visualized:  (any)   []  cxs/plates in lab [x]  plain film images []  CT images []  PET images   []  path slide(s) []  ECG tracing [x]  MRI images []  nuclear scan  I discussed: (any) []  micro and/or path w/lab personnel [x]  drug options and/or interactions w/ID pharmD   []  procedure/OR findings w/other MD(s) []  echo and/or imaging w/other MD(s)   []  mgm't w/attending(s) involved in case []  setting up home abx w/OPAT team  Mgm't requires: []  prescription drug(s)  [mod] []  intensive toxicity monitoring  [high]   IMAGING RESULTS: Xray hand left 3rd MCP joint subluxation and erosive changes consistent with inflammatory or infectious   I have  personally reviewed the films ? Impression/Recommendation Left MCP joint skin wound- this is dry and scar tissue like with atrophy D.D does not look like an active bacteria infection like Staph R/o atypical infection like mycobacteria and fungal infections R/o calciphylaxis lesion R/o Porphyria cutanea tarda especially with cirrhosis -  No need for  antibiotics currently- once diagnostic tests  have been sent then can consider it  HIV/HEPC neg  Left foot ulcer could be PAD with being a smoker  ESRD on HD- left arm   Cirrhosis liver  Anemia due to Renal disease     Recommend biopsy ( punch or incisional ) for pathology And biopsy tissue to be sent for cultures ( mycobacterial, fungal and bacterial-? ? _ Discussed the management with patient and also with the Dr.Carter _______________________________________       [1]  Allergies Allergen Reactions   Betadine [Povidone Iodine ] Rash   Povidone-Iodine  Dermatitis

## 2024-08-16 NOTE — Progress Notes (Signed)
 Hemodialysis Note:  Received patient in bed to unit. Alert and oriented. Informed consent singed and in chart.  Treatment initiated: 0930 Treatment completed: 1305  Access used: Left AVF Access issues: None  Patient tolerated well. Transported back to room, alert without acute distress. Report given to patient's RN.  Total UF removed: 1 liter Medications given: Dilaudid  4 mg Tablet  Post HD weight: 59 Kg  [facility]

## 2024-08-16 NOTE — Progress Notes (Addendum)
 "                                                                                                                                                                                                          Daily Progress Note   Patient Name: Mario [name]       Date: 08/16/2024 DOB: Aug 08, 1970  Age: 54 y.o. MRN#: [id number] Attending Physician: Franchot Novel, MD Primary Care Physician: Lateef, Munsoor, MD Admit Date: 08/11/2024  Reason for Consultation/Follow-up: Establishing goals of care  Subjective: Notes and labs reviewed.  Patient is currently in dialysis bay receiving dialysis.  He is able to speak some English and currently denies pain or complaint.  Ongoing and very detailed epic chat with attending, TOC, pharmacy, and others regarding options upon discharge and symptom management.  Length of Stay: 4  Current Medications: Scheduled Meds:   amLODipine   5 mg Oral Daily   aspirin  EC  81 mg Oral Daily   atorvastatin   10 mg Oral Daily   clopidogrel   75 mg Oral Daily   feeding supplement (NEPRO CARB STEADY)  237 mL Oral BID BM   gabapentin   200 mg Oral Q T,Th,Sat-1800   heparin   5,000 Units Subcutaneous Q12H   lactulose   20 g Oral QID   nicotine   14 mg Transdermal Daily   polyethylene glycol  17 g Oral Daily   senna-docusate  1 tablet Oral QHS   sevelamer  carbonate  800 mg Oral TID WC   Vitamin D  (Ergocalciferol )  50,000 Units Oral Q7 days    Continuous Infusions:   PRN Meds: acetaminophen  **OR** acetaminophen , diphenhydrAMINE , fentaNYL  (SUBLIMAZE ) injection, HYDROmorphone  **OR** HYDROmorphone , ondansetron  **OR** ondansetron  (ZOFRAN ) IV, prednisoLONE  acetate, prochlorperazine , traZODone , triamcinolone  cream  Physical Exam Pulmonary:     Effort: Pulmonary effort is normal.  Skin:    General: Skin is warm and dry.  Neurological:     Mental Status: She is alert.             Vital Signs: BP 137/62 (BP Location: Right Arm)   Pulse 63   Temp 97.7 F (36.5  C) (Oral)   Resp 14   Ht 5' 2.99 (1.6 m)   Wt 60 kg   SpO2 100%   BMI 23.44 kg/m  SpO2: SpO2: 100 % O2 Device: O2 Device: Room Air O2 Flow Rate: O2 Flow Rate (L/min): 0 L/min  Intake/output summary:  Intake/Output Summary (Last 24 hours) at 08/16/2024 1227 Last data filed at 08/16/2024 0856 Gross per 24 hour  Intake 240 ml  Output --  Net  240 ml   LBM: Last BM Date : 08/14/24 Baseline Weight: Weight: 74.8 kg Most recent weight: Weight: 60 kg   Patient Active Problem List   Diagnosis Date Noted   Chronic osteomyelitis of left foot (HCC) 08/11/2024   Osteomyelitis (HCC) 08/11/2024   Nausea and vomiting 10/16/2021   Ascites 10/16/2021   Generalized weakness 10/16/2021   Sepsis (HCC) 10/15/2021   Encounter to establish care 05/03/2020   Near syncope 04/15/2020   Soft tissue mass in left temple area    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   Tobacco abuse 04/16/2019   Anemia in ESRD (end-stage renal disease) (HCC) 04/16/2019   Neck pain, bilateral posterior 04/16/2019   Cervical spinal stenosis 04/13/2019   Cervical vertebral fracture (HCC) 04/13/2019   Neck pain 04/03/2019   Cervical radiculopathy 04/03/2019   Hypertensive urgency 04/01/2019   Intractable abdominal pain 05/01/2018   Gastroenteritis 10/02/2017   Abdominal pain, acute 08/21/2017   Malnutrition of moderate degree 08/21/2017   SBP (spontaneous bacterial peritonitis) (HCC) 06/09/2017   Erroneous encounter - disregard 03/04/2017   Hyperkalemia 11/27/2016   Acute hyperkalemia 10/28/2016   Chronic diastolic (congestive) heart failure (HCC) 10/13/2016   Cirrhosis (HCC) 10/13/2016   HTN (hypertension), malignant 10/13/2016   Abdominal pain 08/28/2016   Pulmonary edema 07/03/2016   Fluid overload 04/12/2016   Chest pain 04/11/2016   Scrotal infection 01/25/2015   ESRD on dialysis [facility]) 01/25/2015    Palliative Care Assessment & Plan    Recommendations/Plan: Continue current care.   Code Status:     Code Status Orders  (From admission, onward)           Start     Ordered   08/11/24 1131  Full code  Continuous       Question:  By:  Answer:  Consent: discussion documented in EHR   08/11/24 1131           Code Status History     Date Active Date Inactive Code Status Order ID Comments User Context   02/03/2022 1119 02/04/2022 1503 Full Code [id number]  Tobie Calix, MD ED   10/15/2021 1946 10/18/2021 1613 Full Code [id number]  Cox, Amy N, DO ED   04/15/2020 1319 04/17/2020 2116 Full Code [id number]  Hilma Rankins, MD ED   12/27/2019 1715 12/31/2019 2030 Full Code [id number]  Hilma Rankins, MD ED   04/16/2019 0026 04/21/2019 1835 Full Code [id number]  Alfornia Madison, MD ED   04/03/2019 0541 04/13/2019 2141 Full Code [id number]  Luke Agent, MD Inpatient   04/01/2019 1440 04/03/2019 0421 Full Code [id number]  Kathrene Almarie Bake, NP ED   05/01/2018 1757 05/04/2018 1927 Full Code [id number]  Mayo, Rockie Overly, MD Inpatient   10/02/2017 0737 10/02/2017 1659 Full Code [id number]  Salary, Nemiah BIRCH, MD Inpatient   08/21/2017 0208 08/23/2017 1621 Full Code [id number]  Dallis Slough, MD ED   06/09/2017 2048 06/13/2017 1907 Full Code [id number]  Tobie Press, MD Inpatient   01/06/2017 1437 01/07/2017 1744 Full Code [id number]  Almeda Bernard, MD ED   11/27/2016 1431 11/30/2016 1423 Full Code [id number]  Runell Redia PARAS, MD Inpatient   10/28/2016 1611 10/31/2016 1638 Full Code [id number]  Almeda Bernard, MD ED   10/13/2016 2136 10/14/2016 2123 Full Code [id number]  Jenel Lenis, MD Inpatient   08/28/2016 1658 08/30/2016 1719 Full Code [id number]  Tobie Calix, MD ED   07/03/2016 2006 07/06/2016 1622 Full Code [id number]  Tobie Calix, MD ED   04/11/2016 1857 04/12/2016 0716 Full Code [id number]  Telford,  Lorenza, MD Inpatient   01/25/2015 1229 01/28/2015 1626 Full Code [id number]  Tobie Press, MD Inpatient       11:10-11:35  Camelia Lewis, NP  Please contact Palliative Medicine Team phone at [phone] for questions  and concerns.       "

## 2024-08-16 NOTE — Progress Notes (Signed)
 " Progress Note   Patient: Mario [name] FMW:[id number] DOB: 1971/07/13 DOA: 08/11/2024     4 DOS: the patient was seen and examined on 08/16/2024   Brief hospital course: Per H&P HPI  [name] is a 54 y.o. adult with medical history significant of ESRD and HD TTS, HTN HLD, presented with worsening of left foot pain and nonhealing wound of left hand.   Patient complains about having left foot pain for 7+ days, quality getting worse, last 2 days even putting weight on left foot can trigger significant pain.  He pointed the pain mainly to the 5 toes especially the 1st and 5th toes, he has a small ulcer on lateral side of the pinky toe.  Denies any history of claudications.  Meantime he also complained about a unhealing wound of left hand, located on the extension side of the MCP joint, this has been a more chronic issue and he reported that he has not been able to bend left middle finger for several months.  Denied any fever or chills.  Last dialysis was last Tuesday.   ED Course: Afebrile, nontachycardic blood pressure 164/73 O2 saturation 100% on room air.  CTA of abdomen pelvis bifurcation lower extremity showed no significant occlusion or stenosis of major vessels but heavy calcification of medium and small vessels.  Blood work showed WBC 5.3 hemoglobin 12.5 BUN 40 creatinine 7.1 K4.5 bicarb 25.   Vascular surgery consulted.  Assessment and Plan: Rest ischemic pain V neuropathic pain  S/p LE angiography with angioplasty, per [facility]VS no further intervention recommended at this time. Per podiatry no surgical needs from their standpoint at this time.   Continue statin.  Continue DAPT Continue HM and Fentanyl , palliative assisting with pain regimen gabapentin  on HD days after HD  ESRD on HD TThS Continue HD per nephrology   Hepatic cirrhosis with mild decompensation.  Coagulopathy with elevated INR, thrombocytopenia, noted to have esophageal and paraesophageal varices as  well as splenomegaly on imaging..  Patient with large volume ascites on ultrasound.  Patient states that he does not drink alcohol.  Hepatitis B, Hep C negative. MELDNA 25. 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  Hand surgery team recommended transfer to tertiary care center, [facility] and [facility] declined transfer  Has appointment with [facility] hand surgeon in [place] but has not transportation there. ID consulted for possible antibiotics.    Hyper K resolved S/p HD  Isolated ALP elevation Possibly related to underlying liver disease and/dor Vit D deficiency  Will replace vitamin D    HTN Continue amlodipine    HLD Continue statin   Macrocytic anemia  B12 WNL, Folate WNL. Iron  panel c/w anemia of chronic disease   Thrombocytopenia Appears chronic related to cirrhosis   Tobacco use disorder Will discuss cessation Nicotine  replacement therapy      Subjective: Continues to have left foot pain, his pain medications do improve the pain temporarily.  His nausea has improved.  He states he only gets nausea when taking oral lactulose .   Physical Exam: Vitals:   08/16/24 1300 08/16/24 1305 08/16/24 1345 08/16/24 1518  BP: 139/68 139/67 (!) 149/73 133/65  Pulse: 60 67 67 60  Resp: 14 19 18  19  Temp:  97.8 F (36.6 C) 98.2 F (36.8 C) 98.1 F (36.7 C)  TempSrc:  Oral Oral   SpO2: 100% 100% 97% 100%  Weight:  59 kg    Height:           Constitutional: In no distress.  Cardiovascular: Normal rate, regular rhythm. No lower extremity edema  Pulmonary: Non labored breathing on room air, no wheezing or rales.   Abdominal: Soft. Non distended and non tender Musculoskeletal: L hand ulcer. L foot, lateral  aspect with eschar, dorsum of foot erythematous, TT mild palpation. Neurological: Alert and oriented to person, place, and time. Non focal  Skin: Skin is warm and dry.    Data Reviewed: {Tip this will not be part of the note when signed- Document your independent interpretation of telemetry tracing, EKG, lab, Radiology test or any other diagnostic tests. Add any new diagnostic test ordered today. (Optional):[id number]     Latest Ref Rng & Units 08/16/2024    5:35 AM 08/14/2024    6:39 AM 08/13/2024    6:36 AM  BMP  Glucose 70 - 99 mg/dL 87  79  80   BUN 6 - 20 mg/dL 40  25  36   Creatinine 0.61 - 1.24 mg/dL 2.29  4.82  3.42   Sodium 135 - 145 mmol/L 135  132  133   Potassium 3.5 - 5.1 mmol/L 4.2  4.2  4.9   Chloride 98 - 111 mmol/L 90  93  94   CO2 22 - 32 mmol/L 29  27  25    Calcium  8.9 - 10.3 mg/dL 9.5  9.7    9.8  9.5       Latest Ref Rng & Units 08/16/2024    5:35 AM 08/15/2024    2:07 PM 08/14/2024    6:39 AM  CBC  WBC 4.0 - 10.5 K/uL 4.4  3.9  3.7   Hemoglobin 13.0 - 17.0 g/dL 87.2  88.0  87.7   Hematocrit 39.0 - 52.0 % 38.7  36.7  37.4   Platelets 150 - 400 K/uL 86  87  90      Family Communication: None available  Disposition: Status is: Inpatient Remains inpatient appropriate because: management of foot pain   Planned Discharge Destination: Pending clinical course     Time spent: 35 minutes  Author: Alban Pepper, MD 08/16/2024 5:24 PM  For on call review [email or web address].  "

## 2024-08-16 NOTE — Progress Notes (Signed)
 Orthopaedic Plan of Care   Given the complexity of the chronic metacarpophalangeal joint septic arthritis and osteomyelitis with an overlying ulcerative skin lesion, I have personally arranged outpatient follow-up for this patient with hand surgeon with plastic surgery experience who can more adequately manage the soft tissue aspect of treating this infection.  He will likely need a ray resection with possible flap coverage.  The primary team and I attempted to transfer the patient to multiple tertiary care centers but he was rejected from all of them.  He has an appointment with Dr. Ona Roch team of the [facility].  Details of this appointment have been provided to the patient's care team.  They are working with social services to help make arrangements for the patient to attend the appointment.   I will continue to follow this patient peripherally while he is in the hospital.  Please reach out with any concerns or questions.  Jackquline CANDIE Barrack, MD Orthopaedic Surgery [facility]

## 2024-08-16 NOTE — TOC Progression Note (Signed)
 Transition of Care [facility]) - Progression Note    Patient Details  Name: Mario [name] MRN: [id number] Date of Birth: February 10, 1971  [facility] CM/SW Contact  Corean ONEIDA Haddock, RN Phone Number: 08/16/2024, 4:49 PM  Clinical Narrative:     Patient was off the unit for HD when I attempted to complete assessment.  CM to follow up                     Expected Discharge Plan and Services                                               Social Drivers of Health (SDOH) Interventions SDOH Screenings   Food Insecurity: Food Insecurity Present (08/11/2024)  Housing: High Risk (08/11/2024)  Transportation Needs: Unmet Transportation Needs (08/11/2024)  Utilities: At Risk (08/11/2024)  Social Connections: Moderately Isolated (08/11/2024)  Tobacco Use: High Risk (08/11/2024)    Readmission Risk Interventions     No data to display

## 2024-08-17 DIAGNOSIS — K7031 Alcoholic cirrhosis of liver with ascites: Secondary | ICD-10-CM

## 2024-08-17 DIAGNOSIS — M79672 Pain in left foot: Secondary | ICD-10-CM

## 2024-08-17 LAB — CBC WITH DIFFERENTIAL/PLATELET
Abs Immature Granulocytes: 0.01 K/uL (ref 0.00–0.07)
Basophils Absolute: 0.1 K/uL (ref 0.0–0.1)
Basophils Relative: 1 %
Eosinophils Absolute: 0.2 K/uL (ref 0.0–0.5)
Eosinophils Relative: 4 %
HCT: 38.2 % — ABNORMAL LOW (ref 39.0–52.0)
Hemoglobin: 12.6 g/dL — ABNORMAL LOW (ref 13.0–17.0)
Immature Granulocytes: 0 %
Lymphocytes Relative: 14 %
Lymphs Abs: 0.6 K/uL — ABNORMAL LOW (ref 0.7–4.0)
MCH: 33.2 pg (ref 26.0–34.0)
MCHC: 33 g/dL (ref 30.0–36.0)
MCV: 100.8 fL — ABNORMAL HIGH (ref 80.0–100.0)
Monocytes Absolute: 0.6 K/uL (ref 0.1–1.0)
Monocytes Relative: 14 %
Neutro Abs: 2.9 K/uL (ref 1.7–7.7)
Neutrophils Relative %: 67 %
Platelets: 84 K/uL — ABNORMAL LOW (ref 150–400)
RBC: 3.79 MIL/uL — ABNORMAL LOW (ref 4.22–5.81)
RDW: 14.6 % (ref 11.5–15.5)
WBC: 4.4 K/uL (ref 4.0–10.5)
nRBC: 0 % (ref 0.0–0.2)

## 2024-08-17 MED ORDER — NEPRO/CARBSTEADY PO LIQD: 237.0000 mL | Freq: Three times a day (TID) | ORAL | Status: DC

## 2024-08-17 MED ORDER — RENA-VITE PO TABS
1.0000 | ORAL_TABLET | Freq: Every day | ORAL | Status: DC
  Administered 2024-08-17: 1 via ORAL
  Filled 2024-08-17: qty 1

## 2024-08-17 MED ORDER — VITAMIN C 500 MG PO TABS
500.0000 mg | ORAL_TABLET | Freq: Two times a day (BID) | ORAL | Status: DC
  Administered 2024-08-17: 500 mg via ORAL
  Filled 2024-08-17: qty 1

## 2024-08-17 NOTE — Progress Notes (Signed)
 "                                                                                                                                                                                                          Daily Progress Note   Patient Name: Mario [name]       Date: 08/17/2024 DOB: 1970/09/28  Age: 54 y.o. MRN#: [id number] Attending Physician: Dorinda Drue DASEN, MD Primary Care Physician: Lateef, Munsoor, MD Admit Date: 08/11/2024  Reason for Consultation/Follow-up: Goals of care /symptom management  Subjective: Notes and labs reviewed.  Per MAR, last dose of fentanyl  was yesterday at 78.  Oral Dilaudid  4 mg was administered once today.  Patient is receiving Neurontin , renally dosed.  In to see patient.  Currently TOC and [facility] interpreter are at bedside discussing discharge planning.  Patient states he has family members in the [place] area as well as some, including a brother who live outside of the country.  He states he has been [place] in the [place].  He states his family assists him with buying medication and he begs for food.  He states his niece who lives in the area would be his surrogate management consultant.  Patient states his pain is improved.  He states he continues to have pain in his left foot that is a burning pain.  He states his nausea has improved.  PMT stepped out for TOC to continue discussions.  Attending team updated.  Length of Stay: 5  Current Medications: Scheduled Meds:   amLODipine   5 mg Oral Daily   aspirin  EC  81 mg Oral Daily   atorvastatin   10 mg Oral Daily   clopidogrel   75 mg Oral Daily   feeding supplement (NEPRO CARB STEADY)  237 mL Oral BID BM   gabapentin   200 mg Oral Q T,Th,Sat-1800   heparin   5,000 Units Subcutaneous Q12H   nicotine   14 mg Transdermal Daily   polyethylene glycol  17 g Oral Daily   senna-docusate  1 tablet Oral QHS   sevelamer  carbonate  800 mg Oral TID WC   Vitamin D  (Ergocalciferol )  50,000 Units Oral Q7 days     Continuous Infusions:   PRN Meds: acetaminophen  **OR** acetaminophen , diphenhydrAMINE , fentaNYL  (SUBLIMAZE ) injection, HYDROmorphone  **OR** HYDROmorphone , ondansetron  **OR** ondansetron  (ZOFRAN ) IV, prednisoLONE  acetate, prochlorperazine , traZODone , triamcinolone  cream  Physical Exam Pulmonary:     Effort: Pulmonary effort is normal.  Skin:    General: Skin is dry.  Neurological:     Mental Status: He is alert.  Vital Signs: BP 132/60 (BP Location: Left Arm)   Pulse 71   Temp 98.2 F (36.8 C) (Oral)   Resp 20   Ht 5' 2.99 (1.6 m)   Wt 59 kg   SpO2 100%   BMI 23.05 kg/m  SpO2: SpO2: 100 % O2 Device: O2 Device: Room Air O2 Flow Rate: O2 Flow Rate (L/min): 0 L/min  Intake/output summary:  Intake/Output Summary (Last 24 hours) at 08/17/2024 1526 Last data filed at 08/17/2024 0900 Gross per 24 hour  Intake 600 ml  Output --  Net 600 ml   LBM: Last BM Date : 08/16/24 Baseline Weight: Weight: 74.8 kg Most recent weight: Weight: 59 kg        Patient Active Problem List   Diagnosis Date Noted   Wound infection 08/16/2024   Chronic osteomyelitis of left foot (HCC) 08/11/2024   Osteomyelitis (HCC) 08/11/2024   Nausea and vomiting 10/16/2021   Ascites 10/16/2021   Generalized weakness 10/16/2021   Sepsis (HCC) 10/15/2021   Encounter to establish care 05/03/2020   Near syncope 04/15/2020   Soft tissue mass in left temple area    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   Tobacco abuse 04/16/2019   Anemia in ESRD (end-stage renal disease) (HCC) 04/16/2019   Neck pain, bilateral posterior 04/16/2019   Cervical spinal stenosis 04/13/2019   Cervical vertebral fracture (HCC) 04/13/2019   Neck pain 04/03/2019   Cervical radiculopathy 04/03/2019   Hypertensive urgency 04/01/2019   Intractable abdominal pain 05/01/2018   Gastroenteritis 10/02/2017   Abdominal pain, acute 08/21/2017   Malnutrition of moderate degree 08/21/2017   SBP (spontaneous bacterial  peritonitis) (HCC) 06/09/2017   Erroneous encounter - disregard 03/04/2017   Hyperkalemia 11/27/2016   Acute hyperkalemia 10/28/2016   Chronic diastolic (congestive) heart failure (HCC) 10/13/2016   Cirrhosis (HCC) 10/13/2016   HTN (hypertension), malignant 10/13/2016   Abdominal pain 08/28/2016   Pulmonary edema 07/03/2016   Fluid overload 04/12/2016   Chest pain 04/11/2016   Scrotal infection 01/25/2015   ESRD on dialysis [facility]) 01/25/2015    Palliative Care Assessment & Plan    Recommendations/Plan: Would recommend increasing Neurontin  dosing based on patient's pain which is described as burning.  Would continue other medications as ordered.   Code Status:    Code Status Orders  (From admission, onward)           Start     Ordered   08/11/24 1131  Full code  Continuous       Question:  By:  Answer:  Consent: discussion documented in EHR   08/11/24 1131           Code Status History     Date Active Date Inactive Code Status Order ID Comments User Context   02/03/2022 1119 02/04/2022 1503 Full Code [id number]  Tobie Calix, MD ED   10/15/2021 1946 10/18/2021 1613 Full Code [id number]  Cox, Amy N, DO ED   04/15/2020 1319 04/17/2020 2116 Full Code [id number]  Hilma Rankins, MD ED   12/27/2019 1715 12/31/2019 2030 Full Code [id number]  Hilma Rankins, MD ED   04/16/2019 0026 04/21/2019 1835 Full Code [id number]  Alfornia Madison, MD ED   04/03/2019 0541 04/13/2019 2141 Full Code [id number]  Luke Agent, MD Inpatient   04/01/2019 1440 04/03/2019 0421 Full Code [id number]  Kathrene Almarie Bake, NP ED   05/01/2018 1757 05/04/2018 1927 Full Code [id number]  Mayo, Rockie Overly, MD Inpatient   10/02/2017 0737 10/02/2017 1659 Full Code [id number]  Salary, Nemiah BIRCH, MD Inpatient  08/21/2017 0208 08/23/2017 1621 Full Code [id number]  Dallis Slough, MD ED   06/09/2017 2048 06/13/2017 1907 Full Code [id number]  Tobie Press, MD Inpatient   01/06/2017 1437 01/07/2017 1744 Full Code [id number]  Almeda Bernard, MD  ED   11/27/2016 1431 11/30/2016 1423 Full Code [id number]  Runell Redia PARAS, MD Inpatient   10/28/2016 1611 10/31/2016 1638 Full Code [id number]  Almeda Bernard, MD ED   10/13/2016 2136 10/14/2016 2123 Full Code [id number]  Jenel Lenis, MD Inpatient   08/28/2016 1658 08/30/2016 1719 Full Code [id number]  Tobie Calix, MD ED   07/03/2016 2006 07/06/2016 1622 Full Code [id number]  Tobie Calix, MD ED   04/11/2016 1857 04/12/2016 0716 Full Code [id number]  Telford Ingle, MD Inpatient   01/25/2015 1229 01/28/2015 1626 Full Code [id number]  Tobie Press, MD Inpatient     Thank you for allowing the Palliative Medicine Team to assist in the care of this patient.   Time In: 3:00 Time Out: 3:35 Total Time 35 min Prolonged Time Billed  no       Greater than 50%  of this time was spent counseling and coordinating care related to the above assessment and plan.  Camelia Lewis, NP  Please contact Palliative Medicine Team phone at [phone] for questions and concerns.       "

## 2024-08-17 NOTE — Progress Notes (Incomplete)
 PODIATRIC SURGERY:  Progress Note   54 yo M PMH ESRD on iHD, cirrhosis who p/w L foot pain, found to have ischemia and underwent angiogram and angioplsty w/ vascular surgery. ***    PMHx:  Past Medical History:  Diagnosis Date   Abdominal pain 08/28/2016   Acute hyperkalemia 10/28/2016   Chest pain 04/11/2016   Chronic combined systolic and diastolic CHF (congestive heart failure) (HCC)    Cirrhosis (HCC)    Dialysis patient    Edentulous    ESRD (end stage renal disease) on dialysis (HCC) 01/25/2015   Fluid overload 04/12/2016   HTN (hypertension) 10/13/2016   Hyperkalemia 11/27/2016   Hypertension    Mitral regurgitation    Pulmonary edema 07/03/2016   Renal disorder    Renal insufficiency    Scrotal infection 01/25/2015    Surgical Hx:  Past Surgical History:  Procedure Laterality Date   ANTERIOR CERVICAL CORPECTOMY N/A 04/05/2019   Procedure: ANTERIOR CERVICAL CORPECTOMY Cervical six and Cervical seven with strut graft fusion from Cervical five-Thoracic one   ;  Surgeon: Louis Shove, MD;  Location: [facility] OR;  Service: Neurosurgery;  Laterality: N/A;   AV FISTULA PLACEMENT     LOWER EXTREMITY ANGIOGRAPHY Left 08/12/2024   Procedure: Lower Extremity Angiography;  Surgeon: Marea Selinda RAMAN, MD;  Location: [facility] INVASIVE CV LAB;  Service: Cardiovascular;  Laterality: Left;   POSTERIOR CERVICAL FUSION/FORAMINOTOMY N/A 04/05/2019   Procedure: Posterior cervical fusion utilizing segmental instrumentation from Cervical five-Thoracic one ;  Surgeon: Louis Shove, MD;  Location: [facility] OR;  Service: Neurosurgery;  Laterality: N/A;    FHx:  Family History  Problem Relation Age of Onset   Kidney failure Father    Diabetes Mother     Social History:  reports that she has been smoking cigarettes. She has a 6.3 pack-year smoking history. She has never used smokeless tobacco. She reports that she does not drink alcohol and does not use drugs.  Allergies: Allergies[1]  Medications Prior to  Admission  Medication Sig Dispense Refill   amLODipine  (NORVASC ) 5 MG tablet Take 1 tablet (5 mg total) by mouth daily. 30 tablet 2   amoxicillin -clavulanate (AUGMENTIN ) 875-125 MG tablet Take 1 tablet by mouth 2 (two) times daily. (Patient not taking: Reported on 08/11/2024) 14 tablet 0   ferric citrate  (AURYXIA ) 1 GM 210 MG(Fe) tablet Take 420 mg by mouth 3 (three) times daily with meals. (Patient not taking: Reported on 08/11/2024)     prednisoLONE  acetate (PRED FORTE ) 1 % ophthalmic suspension Place 1-2 drops into the right eye every 4 (four) hours as needed. (Patient not taking: Reported on 08/11/2024)      Physical Exam: Blood pressure 134/68, pulse 64, temperature 98.5 F (36.9 C), resp. rate 18, height 5' 2.99 (1.6 m), weight 59 kg, SpO2 96%.  Constitutional: Patient appears of normal development and good nutritional status for stated age, well-groomed, and of normal body habitus. Phonating appropriately.   Psychiatric: Alert and oriented to time, place, person and situation. The patient is noted to have good judgment and insight into the hospital visit  FOCUSED LOWER EXTREMITY EXAMINATION:   Neurological:  - Protective sensation diminished / absent - Gross protective sensation diminished bilaterally.  - No focal motor or sensory deficits identified bilaterally.   Vascular:  - Dorsalis Pedis artery on palpation:  - Posterior Tibial artery on palpation:  - Capillary Filling Times:  - Peripheral edema:  - Dependency Rubor:  - Varicosities:    Musculoskeletal:  Muscle strength: 5/5  in all 4 quadrants  Ankle Joint ROM: decreased, equinus noted Global foot - TTP:   Dermatological:  - Skin quality:  - Interdigital spaces: c/d/i   #WOUND 1   Type: ***Full thickness neuropathic ulceration/ Partial thickness neuropathic ulceration / Venous stasis ulceration / Arterial / ischemic ulceration Location:  --- Wound base: % Granular ; % Fibrosis ; % Necrotic  Adjacent tissue/ Wound  borders:  Undermining noted (Pre-debridement): ; Direction: N/A  PTB:  Malodor:  Active Drainage: ***; If yes, consistency:   Pre Debridement Measurements: cm (l) x cm (w) x cm (d)  Post Debridement Measurements: cm (l) x cm (w) x cm (d)  Methods for Debridement: Sharp (#15/10 blade), Mechanical (Tissue nipper / Curette) Cautery needed(?):   Debridement conducted without incident by above methods to granular wound base tissue with necrotic / fibrotic / biofilm excised by above described methods.  Results for orders placed or performed during the hospital encounter of 08/11/24 (from the past 48 hours)  CBC with Differential/Platelet     Status: Abnormal   Collection Time: 08/15/24  2:07 PM  Result Value Ref Range   WBC 3.9 (L) 4.0 - 10.5 K/uL   RBC 3.61 (L) 4.22 - 5.81 MIL/uL   Hemoglobin 11.9 (L) 13.0 - 17.0 g/dL   HCT 63.2 (L) 60.9 - 47.9 %   MCV 101.7 (H) 80.0 - 100.0 fL   MCH 33.0 26.0 - 34.0 pg   MCHC 32.4 30.0 - 36.0 g/dL   RDW 85.2 88.4 - 84.4 %   Platelets 87 (L) 150 - 400 K/uL    Comment: REPEATED TO VERIFY Immature Platelet Fraction may be clinically indicated, consider ordering this additional test [id number]    nRBC 0.0 0.0 - 0.2 %   Neutrophils Relative % 70 %   Neutro Abs 2.7 1.7 - 7.7 K/uL   Lymphocytes Relative 12 %   Lymphs Abs 0.5 (L) 0.7 - 4.0 K/uL   Monocytes Relative 14 %   Monocytes Absolute 0.5 0.1 - 1.0 K/uL   Eosinophils Relative 3 %   Eosinophils Absolute 0.1 0.0 - 0.5 K/uL   Basophils Relative 1 %   Basophils Absolute 0.0 0.0 - 0.1 K/uL   Immature Granulocytes 0 %   Abs Immature Granulocytes 0.01 0.00 - 0.07 K/uL    Comment: Performed at [facility], [address] [place], [place] [address]  Comprehensive metabolic panel with GFR     Status: Abnormal   Collection Time: 08/16/24  5:35 AM  Result Value Ref Range   Sodium 135 135 - 145 mmol/L   Potassium 4.2 3.5 - 5.1 mmol/L   Chloride 90 (L) 98 - 111 mmol/L   CO2 29 22 - 32 mmol/L    Glucose, Bld 87 70 - 99 mg/dL    Comment: Glucose reference range applies only to samples taken after fasting for at least 8 hours.   BUN 40 (H) 6 - 20 mg/dL   Creatinine, Ser 2.29 (H) 0.61 - 1.24 mg/dL   Calcium  9.5 8.9 - 10.3 mg/dL   Total Protein 6.6 6.5 - 8.1 g/dL   Albumin 3.4 (L) 3.5 - 5.0 g/dL   AST 41 15 - 41 U/L   ALT 10 0 - 44 U/L   Alkaline Phosphatase 171 (H) 38 - 126 U/L   Total Bilirubin 1.0 0.0 - 1.2 mg/dL   GFR, Estimated 8 (L) >60 mL/min    Comment: (NOTE) Calculated using the CKD-EPI Creatinine Equation (2021)    Anion  gap 16 (H) 5 - 15    Comment: Performed at [facility], [address] [place], [place] [address]  CBC     Status: Abnormal   Collection Time: 08/16/24  5:35 AM  Result Value Ref Range   WBC 4.4 4.0 - 10.5 K/uL   RBC 3.83 (L) 4.22 - 5.81 MIL/uL   Hemoglobin 12.7 (L) 13.0 - 17.0 g/dL   HCT 61.2 (L) 60.9 - 47.9 %   MCV 101.0 (H) 80.0 - 100.0 fL   MCH 33.2 26.0 - 34.0 pg   MCHC 32.8 30.0 - 36.0 g/dL   RDW 85.3 88.4 - 84.4 %   Platelets 86 (L) 150 - 400 K/uL    Comment: REPEATED TO VERIFY Immature Platelet Fraction may be clinically indicated, consider ordering this additional test [id number]    nRBC 0.0 0.0 - 0.2 %    Comment: Performed at [facility], [address] [place], [place] [address]  Iron  and TIBC     Status: Abnormal   Collection Time: 08/16/24  5:35 AM  Result Value Ref Range   Iron  85 45 - 182 ug/dL   TIBC 824 (L) 749 - 549 ug/dL   Saturation Ratios 49 (H) 17.9 - 39.5 %   UIBC 90 ug/dL    Comment: Performed at [facility], [address] [place], [place] [address]  Ferritin     Status: Abnormal   Collection Time: 08/16/24  5:35 AM  Result Value Ref Range   Ferritin 1,130 (H) 24 - 336 ng/mL    Comment: Performed at [facility], [address] [place], [place] [address]  CBC with Differential/Platelet     Status: Abnormal   Collection Time: 08/17/24  4:30 AM  Result Value Ref  Range   WBC 4.4 4.0 - 10.5 K/uL   RBC 3.79 (L) 4.22 - 5.81 MIL/uL   Hemoglobin 12.6 (L) 13.0 - 17.0 g/dL   HCT 61.7 (L) 60.9 - 47.9 %   MCV 100.8 (H) 80.0 - 100.0 fL   MCH 33.2 26.0 - 34.0 pg   MCHC 33.0 30.0 - 36.0 g/dL   RDW 85.3 88.4 - 84.4 %   Platelets 84 (L) 150 - 400 K/uL    Comment: CONSISTENT WITH PREVIOUS RESULT Immature Platelet Fraction may be clinically indicated, consider ordering this additional test [id number]    nRBC 0.0 0.0 - 0.2 %   Neutrophils Relative % 67 %   Neutro Abs 2.9 1.7 - 7.7 K/uL   Lymphocytes Relative 14 %   Lymphs Abs 0.6 (L) 0.7 - 4.0 K/uL   Monocytes Relative 14 %   Monocytes Absolute 0.6 0.1 - 1.0 K/uL   Eosinophils Relative 4 %   Eosinophils Absolute 0.2 0.0 - 0.5 K/uL   Basophils Relative 1 %   Basophils Absolute 0.1 0.0 - 0.1 K/uL   Immature Granulocytes 0 %   Abs Immature Granulocytes 0.01 0.00 - 0.07 K/uL    Comment: Performed at [facility], [address] [place], [place] [address]   US  Paracentesis Result Date: 08/15/2024 INDICATION: 54 year old male. History of CHF, end-stage renal disease and cirrhosis. Found to have ascites. Request is for therapeutic and diagnostic paracentesis. EXAM: ULTRASOUND GUIDED THERAPEUTIC AND DIAGNOSTIC RIGHT-SIDED PARACENTESIS MEDICATIONS: Lidocaine  1% 10 mL COMPLICATIONS: None immediate. PROCEDURE: Informed written consent was obtained from the patient after a discussion of the risks, benefits and alternatives to treatment. A timeout was performed prior to the initiation of the procedure. Initial ultrasound  scanning demonstrates a moderate amount of ascites within the right lower abdominal quadrant. The right lower abdomen was prepped and draped in the usual sterile fashion. 1% lidocaine  was used for local anesthesia. Following this, a 19 gauge, 7-cm, Yueh catheter was introduced. An ultrasound image was saved for documentation purposes. The paracentesis was performed. The catheter was removed and a  dressing was applied. The patient tolerated the procedure well without immediate post procedural complication. FINDINGS: A total of approximately 4.4 L of straw color fluid was removed. Samples were sent to the laboratory as requested by the clinical team. IMPRESSION: Successful ultrasound-guided therapeutic and diagnostic right-sided paracentesis yielding 4.4 liters of peritoneal fluid. Performed by Delon Beagle NP PLAN: If the patient eventually requires >/=2 paracenteses in a 30 day period, candidacy for formal evaluation by the [facility] Interventional Radiology Portal Hypertension Clinic will be assessed. Electronically Signed   By: CHRISTELLA.  Shick M.D.   On: 08/15/2024 11:19     Imaging: ***  Assessment  PVD with ischemic changes left foot Stable eschar present left lateral fifth metatarsal phalangeal joint Possible osteomyelitis left fifth metatarsal phalangeal joint End-stage renal disease on dialysis    Plan  - ABX: Appreciate Medicine / ID / Pharmacy assist with antibiotic stewardship and appropriate coverage.  - Activity: *** NWB WBAT to the LEFT RIGHT lower extremity.   - Wound Care: Dressing instructions.   - X-ray imaging reviewed.  No apparent evidence for osteomyelitis on x-ray imaging.  MRI reviewed.  Appears show some mild marrow edema present to the lateral aspect of the fifth metatarsal phalangeal joint area at the fifth metatarsal head where there is a bony exostosis present to the area.  Possibly osteomyelitis versus chronic inflammatory change.  ESR and CRP were within normal limits, white blood cell count within normal limits. - As far as infection goes left foot appears to be very stable at this time.  Wound culture taken from the eschar area but would expected to grow normal skin bacteria.  Consider antibiotic resume in the future if worsening what appears to be very stable. - Recommend Betadine paint to the area daily.  Orders placed for this to be performed.   Postop shoe ordered for left foot.  Also recommend applying a small padded Mepilex border in which he should wear if he is walking around to avoid pressure to the area.  Should allow this area to air dry though for the most part. - No surgery indicated for the left foot at this time besides improvement in circulation.  Patient is going for CT angiogram with intervention today to the left lower extremity.  Patient appears to have heavily calcified arteries on x-ray and MRI imaging.  Appreciate vascular recommendations.  Khaidyn Staebell KANDICE Blush 08/17/2024, 10:45 AM     [1]  Allergies Allergen Reactions   Betadine [Povidone Iodine ] Rash   Povidone-Iodine  Dermatitis

## 2024-08-17 NOTE — Progress Notes (Signed)
 " [facility]  ROUNDING NOTE   Subjective:   Mario [name] is a 54 y.o. male with past medical history including hypertension, neuropathy, chronic liver cirrhosis, portal hypertension, and end-stage renal disease on hemodialysis. Patient seen in ED for foot pain and swelling. He has been admitted for Osteomyelitis [facility]) [M86.9] Patient is known to our practice and receives outpatient dialysis treatments at [facility] on a TTS schedule, overseen by [employer].  Update:  Patient seen sitting up in bed  Alert Discomfort in left foot    Objective:  Vital signs in last 24 hours:  Temp:  [97.8 F (36.6 C)-98.5 F (36.9 C)] 98.5 F (36.9 C) (01/14 0426) Pulse Rate:  [58-68] 64 (01/14 0426) Resp:  [13-20] 18 (01/14 0426) BP: (133-149)/(62-92) 134/68 (01/14 0426) SpO2:  [96 %-100 %] 96 % (01/14 0426) Weight:  [59 kg] 59 kg (01/13 1305)  Weight change:  Filed Weights   08/13/24 1608 08/16/24 0915 08/16/24 1305  Weight: 62.2 kg 60 kg 59 kg    Intake/Output: I/O last 3 completed shifts: In: 600 [P.O.:600] Out: -    Intake/Output this shift:  Total I/O In: 240 [P.O.:240] Out: -   Physical Exam: General: NAD  Head: Normocephalic  Eyes: Anicteric  Lungs:  normal effort, on room air  Heart: Regular rate  Abdomen:  Soft, nontender  Extremities:  No peripheral edema  Neurologic: Awake, alert  Skin: Warm,dry  Access: Lt AVF    Basic Metabolic Panel: Recent Labs  Lab 08/11/24 0726 08/12/24 0600 08/13/24 0636 08/14/24 0639 08/16/24 0535  NA 135 134* 133* 132* 135  K 4.5 5.4* 4.9 4.2 4.2  CL 94* 96* 94* 93* 90*  CO2 25 22 25 27 29   GLUCOSE 73 72 80 79 87  BUN 40* 46* 36* 25* 40*  CREATININE 7.12* 8.16* 6.57* 5.17* 7.70*  CALCIUM  9.8 9.7 9.5 9.8  9.7 9.5  PHOS  --   --  6.2* 5.3*  --     Liver Function Tests: Recent Labs  Lab 08/11/24 0726 08/16/24 0535  AST 36 41  ALT 13 10  ALKPHOS 188* 171*  BILITOT 0.8 1.0  PROT  7.6 6.6  ALBUMIN 4.0 3.4*   No results for input(s): LIPASE, AMYLASE in the last 168 hours. No results for input(s): AMMONIA in the last 168 hours.  CBC: Recent Labs  Lab 08/11/24 0726 08/12/24 1100 08/13/24 0636 08/14/24 0639 08/15/24 1407 08/16/24 0535 08/17/24 0430  WBC 5.3   < > 4.3 3.7* 3.9* 4.4 4.4  NEUTROABS 3.8  --  3.0  --  2.7  --  2.9  HGB 12.5*   < > 11.6* 12.2* 11.9* 12.7* 12.6*  HCT 39.0   < > 36.1* 37.4* 36.7* 38.7* 38.2*  MCV 104.0*   < > 104.9* 102.2* 101.7* 101.0* 100.8*  PLT 105*   < > 91* 90* 87* 86* 84*   < > = values in this interval not displayed.    Cardiac Enzymes: No results for input(s): CKTOTAL, CKMB, CKMBINDEX, TROPONINI in the last 168 hours.  BNP: Invalid input(s): POCBNP  CBG: No results for input(s): GLUCAP in the last 168 hours.  Microbiology: Results for orders placed or performed during the hospital encounter of 08/11/24  Blood culture (routine x 2)     Status: None   Collection Time: 08/11/24  7:15 AM   Specimen: BLOOD  Result Value Ref Range Status   Specimen Description BLOOD RIGHT ANTECUBITAL  Final  Special Requests   Final    BOTTLES DRAWN AEROBIC AND ANAEROBIC Blood Culture adequate volume   Culture   Final    NO GROWTH 5 DAYS Performed at [facility], [address] [place], [place] [address]    Report Status 08/16/2024 FINAL  Final  Blood culture (routine x 2)     Status: None   Collection Time: 08/11/24  7:26 AM   Specimen: BLOOD  Result Value Ref Range Status   Specimen Description BLOOD BLOOD RIGHT HAND  Final   Special Requests   Final    BOTTLES DRAWN AEROBIC AND ANAEROBIC Blood Culture results may not be optimal due to an inadequate volume of blood received in culture bottles   Culture   Final    NO GROWTH 5 DAYS Performed at [facility], [address] [place], [place] [address]    Report Status 08/16/2024 FINAL  Final  MRSA Next Gen by PCR, Nasal     Status: None    Collection Time: 08/11/24  4:00 PM   Specimen: Nasal Mucosa; Nasal Swab  Result Value Ref Range Status   MRSA by PCR Next Gen NOT DETECTED NOT DETECTED Final    Comment: (NOTE) The GeneXpert MRSA Assay (FDA approved for NASAL specimens only), is one component of a comprehensive MRSA colonization surveillance program. It is not intended to diagnose MRSA infection nor to guide or monitor treatment for MRSA infections. Test performance is not FDA approved in patients less than 78 years old. Performed at [facility], [address] [place], [place] [address]     Coagulation Studies: No results for input(s): LABPROT, INR in the last 72 hours.   Urinalysis: No results for input(s): COLORURINE, LABSPEC, PHURINE, GLUCOSEU, HGBUR, BILIRUBINUR, KETONESUR, PROTEINUR, UROBILINOGEN, NITRITE, LEUKOCYTESUR in the last 72 hours.  Invalid input(s): APPERANCEUR    Imaging: US  Paracentesis Result Date: 08/15/2024 INDICATION: 54 year old male. History of CHF, end-stage renal disease and cirrhosis. Found to have ascites. Request is for therapeutic and diagnostic paracentesis. EXAM: ULTRASOUND GUIDED THERAPEUTIC AND DIAGNOSTIC RIGHT-SIDED PARACENTESIS MEDICATIONS: Lidocaine  1% 10 mL COMPLICATIONS: None immediate. PROCEDURE: Informed written consent was obtained from the patient after a discussion of the risks, benefits and alternatives to treatment. A timeout was performed prior to the initiation of the procedure. Initial ultrasound scanning demonstrates a moderate amount of ascites within the right lower abdominal quadrant. The right lower abdomen was prepped and draped in the usual sterile fashion. 1% lidocaine  was used for local anesthesia. Following this, a 19 gauge, 7-cm, Yueh catheter was introduced. An ultrasound image was saved for documentation purposes. The paracentesis was performed. The catheter was removed and a dressing was applied. The patient tolerated  the procedure well without immediate post procedural complication. FINDINGS: A total of approximately 4.4 L of straw color fluid was removed. Samples were sent to the laboratory as requested by the clinical team. IMPRESSION: Successful ultrasound-guided therapeutic and diagnostic right-sided paracentesis yielding 4.4 liters of peritoneal fluid. Performed by Delon Beagle NP PLAN: If the patient eventually requires >/=2 paracenteses in a 30 day period, candidacy for formal evaluation by the [facility] Interventional Radiology Portal Hypertension Clinic will be assessed. Electronically Signed   By: CHRISTELLA.  Shick M.D.   On: 08/15/2024 11:19     Medications:      amLODipine   5 mg Oral Daily   aspirin  EC  81 mg Oral Daily   atorvastatin   10 mg Oral Daily   clopidogrel   75 mg Oral Daily  feeding supplement (NEPRO CARB STEADY)  237 mL Oral BID BM   gabapentin   200 mg Oral Q T,Th,Sat-1800   heparin   5,000 Units Subcutaneous Q12H   nicotine   14 mg Transdermal Daily   polyethylene glycol  17 g Oral Daily   senna-docusate  1 tablet Oral QHS   sevelamer  carbonate  800 mg Oral TID WC   Vitamin D  (Ergocalciferol )  50,000 Units Oral Q7 days   acetaminophen  **OR** acetaminophen , diphenhydrAMINE , fentaNYL  (SUBLIMAZE ) injection, HYDROmorphone  **OR** HYDROmorphone , ondansetron  **OR** ondansetron  (ZOFRAN ) IV, prednisoLONE  acetate, prochlorperazine , traZODone , triamcinolone  cream  Assessment/ Plan:  Ms. Eriq Hufford is a 54 y.o.  adult with past medical history including hypertension, neuropathy, chronic liver cirrhosis, portal hypertension, and end-stage renal disease on hemodialysis. Patient seen in ED for foot pain and swelling. He has been admitted for Osteomyelitis (HCC) [M86.9]  [facility]/TTS/Lt AVF  End stage renal disease on hemodialysis. Received dialysis yesterday, UF 1L achieved. Next treatment scheduled for Thursday.   2. Left foot pain, primary team suspects  claudication. Angiogram on 1/9. No further vascular surgery intervention recommended. ID states no need for antibiotics at this time  3. Anemia of chronic kidney disease Lab Results  Component Value Date   HGB 12.6 (L) 08/17/2024    Hgb remains stable, no need for ESA  4. Secondary Hyperparathyroidism: with outpatient labs: None  Lab Results  Component Value Date   PTH 266 (H) 08/14/2024   PTH Comment 08/14/2024   CALCIUM  9.5 08/16/2024   PHOS 5.3 (H) 08/14/2024    Bone minerals within optimal range.   LOS: 5 Dsean Vantol 1/14/202611:15 AM   "

## 2024-08-17 NOTE — Plan of Care (Signed)
" °  Problem: Education: Goal: Knowledge of General Education information will improve Description: Including pain rating scale, medication(s)/side effects and non-pharmacologic comfort measures Outcome: Progressing   Problem: Clinical Measurements: Goal: Respiratory complications will improve Outcome: Progressing Goal: Cardiovascular complication will be avoided Outcome: Progressing   Problem: Activity: Goal: Risk for activity intolerance will decrease Outcome: Progressing   Problem: Coping: Goal: Level of anxiety will decrease Outcome: Progressing   Problem: Nutrition: Goal: Adequate nutrition will be maintained Outcome: Not Progressing   "

## 2024-08-17 NOTE — TOC Progression Note (Signed)
 Transition of Care [facility]) - Progression Note    Patient Details  Name: Mario [name] MRN: [id number] Date of Birth: [birth year]  [facility]) CM/SW Contact  Corean ONEIDA Haddock, RN Phone Number: 08/17/2024, 4:31 PM  Clinical Narrative:     CM assessment complete Full note to follow                     Expected Discharge Plan and Services                                               Social Drivers of Health (SDOH) Interventions SDOH Screenings   Food Insecurity: Food Insecurity Present (08/11/2024)  Housing: High Risk (08/11/2024)  Transportation Needs: Unmet Transportation Needs (08/11/2024)  Utilities: At Risk (08/11/2024)  Social Connections: Moderately Isolated (08/11/2024)  Tobacco Use: High Risk (08/11/2024)    Readmission Risk Interventions     No data to display

## 2024-08-17 NOTE — Progress Notes (Signed)
 " Progress Note   Patient: Mario [name] FMW:[id number] DOB: 1970/10/14 DOA: 08/11/2024     5 DOS: the patient was seen and examined on 08/17/2024    Brief hospital course: From HPI Mario [name] is a 54 y.o. adult with medical history significant of ESRD and HD TTS, HTN HLD, presented with worsening of left foot pain and nonhealing wound of left hand.   Patient complains about having left foot pain for 7+ days, quality getting worse, last 2 days even putting weight on left foot can trigger significant pain.  He pointed the pain mainly to the 5 toes especially the 1st and 5th toes, he has a small ulcer on lateral side of the pinky toe.  Denies any history of claudications.  Meantime he also complained about a unhealing wound of left hand, located on the extension side of the MCP joint, this has been a more chronic issue and he reported that he has not been able to bend left middle finger for several months.  Denied any fever or chills.  Last dialysis was last Tuesday.   ED Course: Afebrile, nontachycardic blood pressure 164/73 O2 saturation 100% on room air.  CTA of abdomen pelvis bifurcation lower extremity showed no significant occlusion or stenosis of major vessels but heavy calcification of medium and small vessels.  Blood work showed WBC 5.3 hemoglobin 12.5 BUN 40 creatinine 7.1 K4.5 bicarb 25.   Vascular surgery consulted.     Assessment and Plan: Rest ischemic pain V neuropathic pain  S/p LE angiography with angioplasty, per VVS no further intervention recommended at this time. Per podiatry no surgical needs from their standpoint at this time.    Continue statin.  Continue DAPT Continue HM and Fentanyl , palliative assisting with pain regimen gabapentin  on HD days after HD   ESRD on HD TThS Continue HD per nephrology    Hepatic cirrhosis with mild decompensation.  Coagulopathy with elevated INR, thrombocytopenia, noted to have esophageal and paraesophageal  varices as well as splenomegaly on imaging..  Patient with large volume ascites on ultrasound.  Patient states that he does not drink alcohol.  Hepatitis B, Hep C negative. MELDNA 25. S/p Paracentesis 4.4L of fluid were removed -Gets HD for volume control  -Lactulose  PO makes patient nauseous, discussed importance of BM while receiving opioids Continue bowel regimen to avoid constipation   L hand non healing ulcer Likely chronic osteo/septic arthritis at MCP of L 3rd digit.  Hand surgery team recommended transfer to tertiary care center, [facility] and [facility] declined transfer  Has appointment with [facility] hand surgeon in [place] but has not transportation there. ID consulted for possible antibiotics.      Hyper K resolved S/p HD   Isolated ALP elevation Possibly related to underlying liver disease and/dor Vit D deficiency  Continue vitamin D     HTN Continue amlodipine      HLD Continue statin    Macrocytic anemia  B12 WNL, Folate WNL. Iron  panel c/w anemia of chronic disease    Thrombocytopenia Appears chronic related to cirrhosis    Tobacco use disorder Will discuss cessation Nicotine  replacement therapy     Family Communication: None available   Disposition: Status is: Inpatient Remains inpatient appropriate because: management of foot pain   Planned Discharge Destination: Pending clinical course      Subjective:  Patient denies any acute overnight event I discussed with infectious disease as well as orthopedics  Physical Exam:  Constitutional: In no distress.  Cardiovascular: Normal rate, regular rhythm.  No lower extremity edema  Pulmonary: Non labored breathing on room air, no wheezing or rales.   Abdominal: Soft. Non distended and non tender Musculoskeletal: L hand ulcer. L foot, lateral aspect with eschar, dorsum of foot erythematous, TT mild palpation. Neurological: Alert and oriented to person, place, and time. Non focal  Skin: Skin is warm and dry.      Data  Reviewed:  Vitals:   08/17/24 0414 08/17/24 0426 08/17/24 0900 08/17/24 1426  BP: 137/63 134/68 129/60 132/60  Pulse: 62 64 71 71  Resp: 16 18 17 20   Temp: 98.1 F (36.7 C) 98.5 F (36.9 C) 98.6 F (37 C) 98.2 F (36.8 C)  TempSrc: Oral   Oral  SpO2: 98% 96% 100% 100%  Weight:      Height:          Latest Ref Rng & Units 08/17/2024    4:30 AM 08/16/2024    5:35 AM 08/15/2024    2:07 PM  CBC  WBC 4.0 - 10.5 K/uL 4.4  4.4  3.9   Hemoglobin 13.0 - 17.0 g/dL 87.3  87.2  88.0   Hematocrit 39.0 - 52.0 % 38.2  38.7  36.7   Platelets 150 - 400 K/uL 84  86  87        Latest Ref Rng & Units 08/16/2024    5:35 AM 08/14/2024    6:39 AM 08/13/2024    6:36 AM  BMP  Glucose 70 - 99 mg/dL 87  79  80   BUN 6 - 20 mg/dL 40  25  36   Creatinine 0.61 - 1.24 mg/dL 2.29  4.82  3.42   Sodium 135 - 145 mmol/L 135  132  133   Potassium 3.5 - 5.1 mmol/L 4.2  4.2  4.9   Chloride 98 - 111 mmol/L 90  93  94   CO2 22 - 32 mmol/L 29  27  25    Calcium  8.9 - 10.3 mg/dL 9.5  9.7    9.8  9.5      Author: Drue ONEIDA Potter, MD 08/17/2024 4:13 PM  For on call review [email or web address].  "

## 2024-08-17 NOTE — Progress Notes (Signed)
 "  Date of Admission:  08/11/2024     ID: Mario [name] is a 54 y.o. male  Principal Problem:   Osteomyelitis (HCC) Active Problems:   Chronic osteomyelitis of left foot (HCC)   Wound infection  Mario [name] is a 54 y.o. male with a history of ESRD, alcohol induced cirrhosis, HTN,  Presenting with worsening left foot pain and non healing wound left hand Pt is originally from [place], been here since 2008, has been on dilaysis, has a had wound rt middle finger BCP joint for a long time Unable to straighten the finger Also having painful left foot lateral ulcer Feels feverish Lives with friend Denies trauma Smoker Quit alcohol once he was diagnosed with cirrhosis  Subjective: same  Medications:   amLODipine   5 mg Oral Daily   aspirin  EC  81 mg Oral Daily   atorvastatin   10 mg Oral Daily   clopidogrel   75 mg Oral Daily   feeding supplement (NEPRO CARB STEADY)  237 mL Oral BID BM   gabapentin   200 mg Oral Q T,Th,Sat-1800   heparin   5,000 Units Subcutaneous Q12H   nicotine   14 mg Transdermal Daily   polyethylene glycol  17 g Oral Daily   senna-docusate  1 tablet Oral QHS   sevelamer  carbonate  800 mg Oral TID WC   Vitamin D  (Ergocalciferol )  50,000 Units Oral Q7 days    Objective: Vital signs in last 24 hours: Patient Vitals for the past 24 hrs:  BP Temp Temp src Pulse Resp SpO2  08/17/24 1927 134/64 98.2 F (36.8 C) -- 70 18 100 %  08/17/24 1426 132/60 98.2 F (36.8 C) Oral 71 20 100 %  08/17/24 0900 129/60 98.6 F (37 C) -- 71 17 100 %  08/17/24 0426 134/68 98.5 F (36.9 C) -- 64 18 96 %  08/17/24 0414 137/63 98.1 F (36.7 C) Oral 62 16 98 %  08/16/24 1959 138/62 98.2 F (36.8 C) -- 68 18 99 %       PHYSICAL EXAM:  General: Alert, cooperative, no distress, chronically ill Lungs: Clear to auscultation bilaterally. No Wheezing or Rhonchi. No rales. Heart: Regular rate and rhythm, no murmur, rub or gallop. Abdomen: Soft, non-tender,not  distended. Bowel sounds normal. No masses Extremities: left AVF Left hamd 3 MCP chronic scar wound- not actively infected Lymph: Cervical, supraclavicular normal. Neurologic: Grossly non-focal Left foot lateral ulcer Lab Results    Latest Ref Rng & Units 08/17/2024    4:30 AM 08/16/2024    5:35 AM 08/15/2024    2:07 PM  CBC  WBC 4.0 - 10.5 K/uL 4.4  4.4  3.9   Hemoglobin 13.0 - 17.0 g/dL 87.3  87.2  88.0   Hematocrit 39.0 - 52.0 % 38.2  38.7  36.7   Platelets 150 - 400 K/uL 84  86  87        Latest Ref Rng & Units 08/16/2024    5:35 AM 08/14/2024    6:39 AM 08/13/2024    6:36 AM  CMP  Glucose 70 - 99 mg/dL 87  79  80   BUN 6 - 20 mg/dL 40  25  36   Creatinine 0.61 - 1.24 mg/dL 2.29  4.82  3.42   Sodium 135 - 145 mmol/L 135  132  133   Potassium 3.5 - 5.1 mmol/L 4.2  4.2  4.9   Chloride 98 - 111 mmol/L 90  93  94   CO2 22 - 32  mmol/L 29  27  25    Calcium  8.9 - 10.3 mg/dL 9.5  9.7    9.8  9.5   Total Protein 6.5 - 8.1 g/dL 6.6     Total Bilirubin 0.0 - 1.2 mg/dL 1.0     Alkaline Phos 38 - 126 U/L 171     AST 15 - 41 U/L 41     ALT 0 - 44 U/L 10         Microbiology: BC NG    Assessment/Plan: Left MCP joint  wound- this is dry and scar tissue like with atrophy D.D does not look like an active bacterial infection like Staph R/o atypical infection like mycobacteria and fungal infections R/o calciphylaxis lesion R/o Porphyria cutanea tarda especially with cirrhosis -  No need for  antibiotics currently- once diagnostic tests  have been sent then can consider it Discussed with Dr.Dalton ortho- he is being referred to hand surgeon as OP They will send tissue for pathology and also tissue for multiple cultures for afb, fungus and bacteria No active infection currently to necessitate antibiotic    HIV/HEPC neg   Left foot ulcer could be PAD - smoker   ESRD on HD- left arm    Cirrhosis liver   Anemia due to Renal disease  Discussed the management with Dr.Dalton and  Dr.Djan  ID will sign off- call if needed  "

## 2024-08-18 DIAGNOSIS — L089 Local infection of the skin and subcutaneous tissue, unspecified: Secondary | ICD-10-CM

## 2024-08-18 DIAGNOSIS — S60922D Unspecified superficial injury of left hand, subsequent encounter: Secondary | ICD-10-CM

## 2024-08-18 LAB — BASIC METABOLIC PANEL WITH GFR
Anion gap: 13 (ref 5–15)
BUN: 29 mg/dL — ABNORMAL HIGH (ref 6–20)
CO2: 29 mmol/L (ref 22–32)
Calcium: 9.5 mg/dL (ref 8.9–10.3)
Chloride: 91 mmol/L — ABNORMAL LOW (ref 98–111)
Creatinine, Ser: 6.73 mg/dL — ABNORMAL HIGH (ref 0.61–1.24)
GFR, Estimated: 9 mL/min — ABNORMAL LOW
Glucose, Bld: 81 mg/dL (ref 70–99)
Potassium: 4.3 mmol/L (ref 3.5–5.1)
Sodium: 133 mmol/L — ABNORMAL LOW (ref 135–145)

## 2024-08-18 LAB — CBC WITH DIFFERENTIAL/PLATELET
Abs Immature Granulocytes: 0.02 K/uL (ref 0.00–0.07)
Basophils Absolute: 0.1 K/uL (ref 0.0–0.1)
Basophils Relative: 1 %
Eosinophils Absolute: 0.3 K/uL (ref 0.0–0.5)
Eosinophils Relative: 6 %
HCT: 39.1 % (ref 39.0–52.0)
Hemoglobin: 12.5 g/dL — ABNORMAL LOW (ref 13.0–17.0)
Immature Granulocytes: 0 %
Lymphocytes Relative: 15 %
Lymphs Abs: 0.7 K/uL (ref 0.7–4.0)
MCH: 32.6 pg (ref 26.0–34.0)
MCHC: 32 g/dL (ref 30.0–36.0)
MCV: 102.1 fL — ABNORMAL HIGH (ref 80.0–100.0)
Monocytes Absolute: 0.7 K/uL (ref 0.1–1.0)
Monocytes Relative: 16 %
Neutro Abs: 2.8 K/uL (ref 1.7–7.7)
Neutrophils Relative %: 62 %
Platelets: 85 K/uL — ABNORMAL LOW (ref 150–400)
RBC: 3.83 MIL/uL — ABNORMAL LOW (ref 4.22–5.81)
RDW: 14.4 % (ref 11.5–15.5)
WBC: 4.6 K/uL (ref 4.0–10.5)
nRBC: 0 % (ref 0.0–0.2)

## 2024-08-18 MED ORDER — HYDROMORPHONE HCL 1 MG/ML IJ SOLN
INTRAMUSCULAR | Status: AC
  Filled 2024-08-18: qty 2

## 2024-08-18 MED ORDER — ACETAMINOPHEN 325 MG PO TABS: 650.0000 mg | ORAL_TABLET | Freq: Four times a day (QID) | ORAL | 0 refills | Status: DC | PRN

## 2024-08-18 MED ORDER — ASCORBIC ACID 500 MG PO TABS: 500.0000 mg | ORAL_TABLET | Freq: Two times a day (BID) | ORAL | 0 refills | Status: DC

## 2024-08-18 MED ORDER — RENA-VITE PO TABS: 1.0000 | ORAL_TABLET | Freq: Every day | ORAL | 0 refills | Status: AC

## 2024-08-18 MED ORDER — SEVELAMER CARBONATE 800 MG PO TABS: 800.0000 mg | ORAL_TABLET | Freq: Three times a day (TID) | ORAL | 3 refills | Status: DC

## 2024-08-18 MED ORDER — PENTAFLUOROPROP-TETRAFLUOROETH EX AERO
1.0000 | INHALATION_SPRAY | CUTANEOUS | Status: DC | PRN
  Administered 2024-08-18: 1 via TOPICAL

## 2024-08-18 MED ORDER — POLYETHYLENE GLYCOL 3350 17 G PO PACK: 17.0000 g | PACK | Freq: Every day | ORAL | 0 refills | Status: DC

## 2024-08-18 MED ORDER — VITAMIN D (ERGOCALCIFEROL) 1.25 MG (50000 UNIT) PO CAPS: 50000.0000 [IU] | ORAL_CAPSULE | ORAL | 3 refills | Status: DC

## 2024-08-18 MED ORDER — GABAPENTIN 100 MG PO CAPS: 200.0000 mg | ORAL_CAPSULE | ORAL | 1 refills | Status: DC

## 2024-08-18 MED ORDER — AMLODIPINE BESYLATE 5 MG PO TABS: 5.0000 mg | ORAL_TABLET | Freq: Every day | ORAL | 2 refills | Status: DC

## 2024-08-18 MED ORDER — CLOPIDOGREL BISULFATE 75 MG PO TABS: 75.0000 mg | ORAL_TABLET | Freq: Every day | ORAL | 6 refills | Status: DC

## 2024-08-18 MED ORDER — PENTAFLUOROPROP-TETRAFLUOROETH EX AERO
INHALATION_SPRAY | CUTANEOUS | Status: AC
  Filled 2024-08-18: qty 30

## 2024-08-18 MED ORDER — HEPARIN SODIUM (PORCINE) 1000 UNIT/ML DIALYSIS: 1000.0000 [IU] | INTRAMUSCULAR | Status: DC | PRN

## 2024-08-18 MED ORDER — ASPIRIN 81 MG PO TBEC: 81.0000 mg | DELAYED_RELEASE_TABLET | Freq: Every day | ORAL | 12 refills | Status: DC

## 2024-08-18 MED ORDER — LIDOCAINE-PRILOCAINE 2.5-2.5 % EX CREA: 1.0000 | TOPICAL_CREAM | CUTANEOUS | Status: DC | PRN

## 2024-08-18 MED ORDER — ATORVASTATIN CALCIUM 10 MG PO TABS: 10.0000 mg | ORAL_TABLET | Freq: Every day | ORAL | 6 refills | Status: DC

## 2024-08-18 NOTE — Progress Notes (Signed)
 "                                                                                                                                                                                                          Daily Progress Note   Patient Name: Mario [name]       Date: 08/18/2024 DOB: 1970/12/11  Age: 54 y.o. MRN#: [id number] Attending Physician: Dorinda Drue DASEN, MD Primary Care Physician: Lateef, Munsoor, MD Admit Date: 08/11/2024  Reason for Consultation/Follow-up: Symptom management  Subjective: Notes, labs and [facility] reviewed.  In to see patient who is currently in dialysis.  He is sleeping soundly.  Spoke with nephrology for updates.  They state his only complaint for them has been burning in the left foot.  Symptom management per primary team; PMT has collaborated to assist with recommendations.  Continue to recommend increasing Neurontin  based on renal dosing for burning/ neuropathic pain.    Length of Stay: 6  Current Medications: Scheduled Meds:   amLODipine   5 mg Oral Daily   vitamin C   500 mg Oral BID   aspirin  EC  81 mg Oral Daily   atorvastatin   10 mg Oral Daily   clopidogrel   75 mg Oral Daily   feeding supplement (NEPRO CARB STEADY)  237 mL Oral TID BM   gabapentin   200 mg Oral Q T,Th,Sat-1800   heparin   5,000 Units Subcutaneous Q12H   multivitamin  1 tablet Oral QHS   nicotine   14 mg Transdermal Daily   polyethylene glycol  17 g Oral Daily   senna-docusate  1 tablet Oral QHS   sevelamer  carbonate  800 mg Oral TID WC   Vitamin D  (Ergocalciferol )  50,000 Units Oral Q7 days    Continuous Infusions:   PRN Meds: acetaminophen  **OR** acetaminophen , diphenhydrAMINE , fentaNYL  (SUBLIMAZE ) injection, heparin , HYDROmorphone  **OR** HYDROmorphone , lidocaine -prilocaine , ondansetron  **OR** ondansetron  (ZOFRAN ) IV, pentafluoroprop-tetrafluoroeth, prednisoLONE  acetate, prochlorperazine , traZODone , triamcinolone  cream  Physical Exam Constitutional:      Comments: Eyes closed   Pulmonary:     Comments: Even and unlabored respirations            Vital Signs: BP (!) 135/93 (BP Location: Right Arm)   Pulse 65   Temp 98.3 F (36.8 C) (Oral)   Resp 15   Ht 5' 2.99 (1.6 m)   Wt 63 kg   SpO2 100%   BMI 24.61 kg/m  SpO2: SpO2: 100 % O2 Device: O2 Device: Room Air O2 Flow Rate: O2 Flow Rate (L/min): 0 L/min  Intake/output summary:  Intake/Output Summary (Last 24 hours)  at 08/18/2024 1000 Last data filed at 08/18/2024 0536 Gross per 24 hour  Intake 600 ml  Output --  Net 600 ml   LBM: Last BM Date : 08/17/24 Baseline Weight: Weight: 74.8 kg Most recent weight: Weight: 63 kg   Patient Active Problem List   Diagnosis Date Noted   Left foot pain 08/17/2024   Wound infection 08/16/2024   Chronic osteomyelitis of left foot (HCC) 08/11/2024   Osteomyelitis (HCC) 08/11/2024   Nausea and vomiting 10/16/2021   Ascites 10/16/2021   Generalized weakness 10/16/2021   Sepsis (HCC) 10/15/2021   Encounter to establish care 05/03/2020   Near syncope 04/15/2020   Soft tissue mass in left temple area    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   Tobacco abuse 04/16/2019   Anemia in ESRD (end-stage renal disease) (HCC) 04/16/2019   Neck pain, bilateral posterior 04/16/2019   Cervical spinal stenosis 04/13/2019   Cervical vertebral fracture (HCC) 04/13/2019   Neck pain 04/03/2019   Cervical radiculopathy 04/03/2019   Hypertensive urgency 04/01/2019   Intractable abdominal pain 05/01/2018   Gastroenteritis 10/02/2017   Abdominal pain, acute 08/21/2017   Malnutrition of moderate degree 08/21/2017   SBP (spontaneous bacterial peritonitis) (HCC) 06/09/2017   Erroneous encounter - disregard 03/04/2017   Hyperkalemia 11/27/2016   Acute hyperkalemia 10/28/2016   Chronic diastolic (congestive) heart failure (HCC) 10/13/2016   Cirrhosis (HCC) 10/13/2016   HTN (hypertension), malignant 10/13/2016   Abdominal pain 08/28/2016   Pulmonary edema 07/03/2016   Fluid overload  04/12/2016   Chest pain 04/11/2016   Scrotal infection 01/25/2015   ESRD on dialysis [facility]) 01/25/2015    Palliative Care Assessment & Plan   Recommendations/Plan: Continue to recommend increasing Neurontin  based on renal dosing. Goals set for full code and full scope  PMT will sign off at this time.  Please reconsult if needs arise.  Code Status:    Code Status Orders  (From admission, onward)           Start     Ordered   08/11/24 1131  Full code  Continuous       Question:  By:  Answer:  Consent: discussion documented in EHR   08/11/24 1131           Code Status History     Date Active Date Inactive Code Status Order ID Comments User Context   02/03/2022 1119 02/04/2022 1503 Full Code [id number]  Tobie Calix, MD ED   10/15/2021 1946 10/18/2021 1613 Full Code [id number]  Cox, Amy N, DO ED   04/15/2020 1319 04/17/2020 2116 Full Code [id number]  Hilma Rankins, MD ED   12/27/2019 1715 12/31/2019 2030 Full Code [id number]  Hilma Rankins, MD ED   04/16/2019 0026 04/21/2019 1835 Full Code [id number]  Alfornia Madison, MD ED   04/03/2019 0541 04/13/2019 2141 Full Code [id number]  Luke Agent, MD Inpatient   04/01/2019 1440 04/03/2019 0421 Full Code [id number]  Kathrene Almarie Bake, NP ED   05/01/2018 1757 05/04/2018 1927 Full Code [id number]  Mayo, Rockie Overly, MD Inpatient   10/02/2017 0737 10/02/2017 1659 Full Code [id number]  Salary, Nemiah BIRCH, MD Inpatient   08/21/2017 0208 08/23/2017 1621 Full Code [id number]  Dallis Slough, MD ED   06/09/2017 2048 06/13/2017 1907 Full Code [id number]  Tobie Press, MD Inpatient   01/06/2017 1437 01/07/2017 1744 Full Code [id number]  Almeda Bernard, MD ED   11/27/2016 1431 11/30/2016 1423 Full Code [id number]  Runell Redia PARAS, MD Inpatient   10/28/2016 1611 10/31/2016 1638 Full Code  [id number]  Almeda Bernard, MD ED   10/13/2016 2136 10/14/2016 2123 Full Code [id number]  Jenel Lenis, MD Inpatient   08/28/2016 1658 08/30/2016 1719 Full Code [id number]  Tobie Calix, MD ED    07/03/2016 2006 07/06/2016 1622 Full Code [id number]  Tobie Calix, MD ED   04/11/2016 1857 04/12/2016 0716 Full Code [id number]  Telford Ingle, MD Inpatient   01/25/2015 1229 01/28/2015 1626 Full Code [id number]  Tobie Press, MD Inpatient       Care plan was discussed with nephrology Thank you for allowing the Palliative Medicine Team to assist in the care of this patient.   Time In: 9:30 Time Out: 10:00 Total Time Prolonged Time Billed  no       Greater than 50%  of this time was spent counseling and coordinating care related to the above assessment and plan.  Camelia Lewis, NP  Please contact Palliative Medicine Team phone at [phone] for questions and concerns.       "

## 2024-08-18 NOTE — Progress Notes (Signed)
 " [facility]  ROUNDING NOTE   Subjective:   Mario [name] is a 54 y.o. male with past medical history including hypertension, neuropathy, chronic liver cirrhosis, portal hypertension, and end-stage renal disease on hemodialysis. Patient seen in ED for foot pain and swelling. He has been admitted for Osteomyelitis [facility]) [M86.9] Patient is known to our practice and receives outpatient dialysis treatments at [facility] on a TTS schedule, overseen by [employer].  Update:  Patient seen and evaluated during dialysis   HEMODIALYSIS FLOWSHEET:  Blood Flow Rate (mL/min): 400 mL/min Arterial Pressure (mmHg): -214.54 mmHg Venous Pressure (mmHg): 198.38 mmHg TMP (mmHg): 3.43 mmHg Ultrafiltration Rate (mL/min): 828 mL/min Dialysate Flow Rate (mL/min): 299 ml/min Bolus Amount (mL): 200 mL  Complains of burning sensation in left foot   Objective:  Vital signs in last 24 hours:  Temp:  [98.2 F (36.8 C)-98.7 F (37.1 C)] 98.3 F (36.8 C) (01/15 0730) Pulse Rate:  [60-99] 99 (01/15 1000) Resp:  [14-20] 16 (01/15 1030) BP: (124-171)/(59-93) 132/59 (01/15 1000) SpO2:  [47 %-100 %] 100 % (01/15 1030) Weight:  [59 kg-63 kg] 63 kg (01/15 0730)  Weight change: -1 kg Filed Weights   08/16/24 1305 08/18/24 0518 08/18/24 0730  Weight: 59 kg 59 kg 63 kg    Intake/Output: I/O last 3 completed shifts: In: 1200 [P.O.:1200] Out: -    Intake/Output this shift:  No intake/output data recorded.  Physical Exam: General: NAD  Head: Normocephalic  Eyes: Anicteric  Lungs:  normal effort, on room air  Heart: Regular rate  Abdomen:  Soft, nontender  Extremities:  No peripheral edema  Neurologic: Awake, alert  Skin: Warm,dry  Access: Lt AVF    Basic Metabolic Panel: Recent Labs  Lab 08/12/24 0600 08/13/24 0636 08/14/24 0639 08/16/24 0535 08/18/24 0537  NA 134* 133* 132* 135 133*  K 5.4* 4.9 4.2 4.2 4.3  CL 96* 94* 93* 90* 91*  CO2 22 25 27 29  29   GLUCOSE 72 80 79 87 81  BUN 46* 36* 25* 40* 29*  CREATININE 8.16* 6.57* 5.17* 7.70* 6.73*  CALCIUM  9.7 9.5 9.8  9.7 9.5 9.5  PHOS  --  6.2* 5.3*  --   --     Liver Function Tests: Recent Labs  Lab 08/16/24 0535  AST 41  ALT 10  ALKPHOS 171*  BILITOT 1.0  PROT 6.6  ALBUMIN 3.4*   No results for input(s): LIPASE, AMYLASE in the last 168 hours. No results for input(s): AMMONIA in the last 168 hours.  CBC: Recent Labs  Lab 08/13/24 0636 08/14/24 0639 08/15/24 1407 08/16/24 0535 08/17/24 0430 08/18/24 0537  WBC 4.3 3.7* 3.9* 4.4 4.4 4.6  NEUTROABS 3.0  --  2.7  --  2.9 2.8  HGB 11.6* 12.2* 11.9* 12.7* 12.6* 12.5*  HCT 36.1* 37.4* 36.7* 38.7* 38.2* 39.1  MCV 104.9* 102.2* 101.7* 101.0* 100.8* 102.1*  PLT 91* 90* 87* 86* 84* 85*    Cardiac Enzymes: No results for input(s): CKTOTAL, CKMB, CKMBINDEX, TROPONINI in the last 168 hours.  BNP: Invalid input(s): POCBNP  CBG: No results for input(s): GLUCAP in the last 168 hours.  Microbiology: Results for orders placed or performed during the hospital encounter of 08/11/24  Blood culture (routine x 2)     Status: None   Collection Time: 08/11/24  7:15 AM   Specimen: BLOOD  Result Value Ref Range Status   Specimen Description BLOOD RIGHT ANTECUBITAL  Final   Special Requests  Final    BOTTLES DRAWN AEROBIC AND ANAEROBIC Blood Culture adequate volume   Culture   Final    NO GROWTH 5 DAYS Performed at [facility], [address] [place], [place] [address]    Report Status 08/16/2024 FINAL  Final  Blood culture (routine x 2)     Status: None   Collection Time: 08/11/24  7:26 AM   Specimen: BLOOD  Result Value Ref Range Status   Specimen Description BLOOD BLOOD RIGHT HAND  Final   Special Requests   Final    BOTTLES DRAWN AEROBIC AND ANAEROBIC Blood Culture results may not be optimal due to an inadequate volume of blood received in culture bottles   Culture   Final    NO GROWTH 5  DAYS Performed at [facility], [address] [place], [place] [address]    Report Status 08/16/2024 FINAL  Final  MRSA Next Gen by PCR, Nasal     Status: None   Collection Time: 08/11/24  4:00 PM   Specimen: Nasal Mucosa; Nasal Swab  Result Value Ref Range Status   MRSA by PCR Next Gen NOT DETECTED NOT DETECTED Final    Comment: (NOTE) The GeneXpert MRSA Assay (FDA approved for NASAL specimens only), is one component of a comprehensive MRSA colonization surveillance program. It is not intended to diagnose MRSA infection nor to guide or monitor treatment for MRSA infections. Test performance is not FDA approved in patients less than 52 years old. Performed at [facility], [address] [place], [place] [address]     Coagulation Studies: No results for input(s): LABPROT, INR in the last 72 hours.   Urinalysis: No results for input(s): COLORURINE, LABSPEC, PHURINE, GLUCOSEU, HGBUR, BILIRUBINUR, KETONESUR, PROTEINUR, UROBILINOGEN, NITRITE, LEUKOCYTESUR in the last 72 hours.  Invalid input(s): APPERANCEUR    Imaging: No results found.    Medications:      amLODipine   5 mg Oral Daily   vitamin C   500 mg Oral BID   aspirin  EC  81 mg Oral Daily   atorvastatin   10 mg Oral Daily   clopidogrel   75 mg Oral Daily   feeding supplement (NEPRO CARB STEADY)  237 mL Oral TID BM   gabapentin   200 mg Oral Q T,Th,Sat-1800   heparin   5,000 Units Subcutaneous Q12H   multivitamin  1 tablet Oral QHS   nicotine   14 mg Transdermal Daily   polyethylene glycol  17 g Oral Daily   senna-docusate  1 tablet Oral QHS   sevelamer  carbonate  800 mg Oral TID WC   Vitamin D  (Ergocalciferol )  50,000 Units Oral Q7 days   acetaminophen  **OR** acetaminophen , diphenhydrAMINE , fentaNYL  (SUBLIMAZE ) injection, heparin , HYDROmorphone  **OR** HYDROmorphone , lidocaine -prilocaine , ondansetron  **OR** ondansetron  (ZOFRAN ) IV, pentafluoroprop-tetrafluoroeth,  prednisoLONE  acetate, prochlorperazine , traZODone , triamcinolone  cream  Assessment/ Plan:  [name] is a 54 y.o.  male with past medical history including hypertension, neuropathy, chronic liver cirrhosis, portal hypertension, and end-stage renal disease on hemodialysis. Patient seen in ED for foot pain and swelling. He has been admitted for Osteomyelitis (HCC) [M86.9]  [facility] [place]/TTS/Lt AVF  End stage renal disease on hemodialysis. Receiving dialysis today, UF 2L as tolerated. Next treatment scheduled for Saturday  2. Left foot pain, primary team suspects claudication. Angiogram on 1/9. No further vascular surgery intervention recommended. ID states no need for antibiotics at this time. Pain management per primary team. Prescribed gabapentin  200mg  three times a week.   3. Anemia of chronic kidney disease  Lab Results  Component Value Date   HGB 12.5 (L) 08/18/2024    Hgb 12.5, within desired range  4. Secondary Hyperparathyroidism: with outpatient labs: None  Lab Results  Component Value Date   PTH 266 (H) 08/14/2024   PTH Comment 08/14/2024   CALCIUM  9.5 08/18/2024   PHOS 5.3 (H) 08/14/2024    Calcium  and phos stable for now. Will continue to monitor.    LOS: 6 Callaway Hailes 1/15/202610:39 AM   "

## 2024-08-18 NOTE — Progress Notes (Signed)
 Pt alert and oriented x4. Tolerated dialysis tx well. No c/o at this time. Hand off report given to Alejandra Tejada, RN.

## 2024-08-18 NOTE — Discharge Summary (Signed)
 " Physician Discharge Summary   Patient: Mario [name] MRN: [id number] DOB: 09-13-1970  Admit date:     08/11/2024  Discharge date: 08/18/24  Discharge Physician: Drue ONEIDA Potter   PCP: Lateef, Munsoor, MD   Recommendations at discharge:  Follow-up with orthopedic surgeon  Discharge Diagnoses: Principal Problem:   Osteomyelitis (HCC) Active Problems:   Chronic osteomyelitis of left foot (HCC)   Wound infection   Left foot pain  Resolved Problems:   * No resolved hospital problems. [place] [facility] Course:  From HPI Mario [name] is a 54 y.o. adult with medical history significant of ESRD and HD TTS, HTN HLD, presented with worsening of left foot pain and nonhealing wound of left hand.   Patient complains about having left foot pain for 7+ days, quality getting worse, last 2 days even putting weight on left foot can trigger significant pain.  He pointed the pain mainly to the 5 toes especially the 1st and 5th toes, he has a small ulcer on lateral side of the pinky toe.  Denies any history of claudications.  Meantime he also complained about a unhealing wound of left hand, located on the extension side of the MCP joint, this has been a more chronic issue and he reported that he has not been able to bend left middle finger for several months.  Denied any fever or chills.  Last dialysis was last Tuesday.   ED Course: Afebrile, nontachycardic blood pressure 164/73 O2 saturation 100% on room air.  CTA of abdomen pelvis bifurcation lower extremity showed no significant occlusion or stenosis of major vessels but heavy calcification of medium and small vessels.  Blood work showed WBC 5.3 hemoglobin 12.5 BUN 40 creatinine 7.1 K4.5 bicarb 25.   Vascular surgery consulted.     Assessment and Plan: Rest ischemic pain V neuropathic pain  S/p LE angiography with angioplasty, per [facility] no further intervention recommended at this time. Per podiatry no surgical needs from their  standpoint at this time.    Continue statin.  Continue DAPT Continue HM and Fentanyl , palliative assisting with pain regimen gabapentin  on HD days after HD   ESRD on HD TThS Continue HD per nephrology    Hepatic cirrhosis with mild decompensation.  Coagulopathy with elevated INR, thrombocytopenia, noted to have esophageal and paraesophageal varices as well as splenomegaly on imaging..  Patient with large volume ascites on ultrasound.  Patient states that he does not drink alcohol.  Hepatitis B, Hep C negative. MELDNA 25. S/p Paracentesis 4.4L of fluid were removed -Gets HD for volume control  -Lactulose  PO makes patient nauseous, discussed importance of BM while receiving opioids   L hand non healing ulcer Likely chronic osteo/septic arthritis at MCP of L 3rd digit.  I discussed with orthopedic surgeon as well as infectious disease. Orthopedics have referred the patient to a local hand surgeon Patient has been informed and he agrees to follow-up     Hyper K resolved S/p HD   Isolated ALP elevation Possibly related to underlying liver disease and/dor Vit D deficiency  Continue vitamin D     HTN Continue amlodipine      HLD Continue statin    Macrocytic anemia  B12 WNL, Folate WNL. Iron  panel c/w anemia of chronic disease    Thrombocytopenia Appears chronic related to cirrhosis    Tobacco use disorder Will discuss cessation Nicotine  replacement therapy     Consultants: Infectious disease, orthopedics Procedures performed: None Disposition: Home Diet recommendation:  Renal diet DISCHARGE MEDICATION:  Allergies as of 08/18/2024       Reactions   Betadine [povidone Iodine ] Rash   Povidone-iodine  Dermatitis        Medication List     STOP taking these medications    amoxicillin -clavulanate 875-125 MG tablet Commonly known as: AUGMENTIN    ferric citrate  1 GM 210 MG(Fe) tablet Commonly known as: AURYXIA        TAKE these medications    acetaminophen   325 MG tablet Commonly known as: TYLENOL  Take 2 tablets (650 mg total) by mouth every 6 (six) hours as needed for mild pain (pain score 1-3) or fever (or Fever >/= 101).   amLODipine  5 MG tablet Commonly known as: NORVASC  Take 1 tablet (5 mg total) by mouth daily.   ascorbic acid  500 MG tablet Commonly known as: VITAMIN C  Take 1 tablet (500 mg total) by mouth 2 (two) times daily.   aspirin  EC 81 MG tablet Take 1 tablet (81 mg total) by mouth daily. Swallow whole.   atorvastatin  10 MG tablet Commonly known as: LIPITOR Take 1 tablet (10 mg total) by mouth daily.   clopidogrel  75 MG tablet Commonly known as: PLAVIX  Take 1 tablet (75 mg total) by mouth daily.   gabapentin  100 MG capsule Commonly known as: NEURONTIN  Take 2 capsules (200 mg total) by mouth every Tuesday, Thursday, and Saturday at 6 PM.   multivitamin Tabs tablet Take 1 tablet by mouth at bedtime.   polyethylene glycol 17 g packet Commonly known as: MIRALAX  / GLYCOLAX  Take 17 g by mouth daily.   prednisoLONE  acetate 1 % ophthalmic suspension Commonly known as: PRED FORTE  Place 1-2 drops into the right eye every 4 (four) hours as needed.   sevelamer  carbonate 800 MG tablet Commonly known as: RENVELA  Take 1 tablet (800 mg total) by mouth 3 (three) times daily with meals.   Vitamin D  (Ergocalciferol ) 1.25 MG (50000 UNIT) Caps capsule Commonly known as: DRISDOL  Take 1 capsule (50,000 Units total) by mouth every 7 (seven) days. Start taking on: August 23, 2024               Durable Medical Equipment  (From admission, onward)           Start     Ordered   08/16/24 1312  For home use only DME Walker rolling  Once       Question Answer Comment  Walker: With 5 Inch Wheels   Patient needs a walker to treat with the following condition Stroke [facility])      08/16/24 1311            Follow-up Information     Lennie Barter, DPM. Go in 1 week(s).   Specialty: Podiatry Why: Appointment scheduled  for 08/26/2024 @ 8:45 am. Please arrive 15 minutes early and bring a copy of your insurance card and a list of your medications.  Cita programada para el 23 de enero a las 8:45 a. m. Por favor, llegue 15 minutos antes y traiga una copia de su tarjeta de seguro mdico y una lista de sus medicamentos. Contact information: [address] [place] [address] [phone]         Ona Calleen Hilt, MD Follow up.   Specialties: Orthopedic Surgery, Plastic Surgery Why: The appointment is on Thursday, Jan 29 at 10:30.  La cita es el jueves 29 de enero a las 10:30. Contact information: [address] [facility] [place] [address] [phone]  Discharge Exam: Filed Weights   08/18/24 0518 08/18/24 0730 08/18/24 1152  Weight: 59 kg 63 kg 61 kg   Constitutional: In no distress.  Cardiovascular: Normal rate, regular rhythm. No lower extremity edema  Pulmonary: Non labored breathing on room air, no wheezing or rales.   Abdominal: Soft. Non distended and non tender Musculoskeletal: L hand ulcer. L foot, lateral aspect with eschar, dorsum of foot erythematous, TT mild palpation. Neurological: Alert and oriented to person, place, and time. Non focal  Skin: Skin is warm and dry.   Condition at discharge: good  The results of significant diagnostics from this hospitalization (including imaging, microbiology, ancillary and laboratory) are listed below for reference.   Imaging Studies: US  Paracentesis Result Date: 08/15/2024 INDICATION: 54 year old male. History of CHF, end-stage renal disease and cirrhosis. Found to have ascites. Request is for therapeutic and diagnostic paracentesis. EXAM: ULTRASOUND GUIDED THERAPEUTIC AND DIAGNOSTIC RIGHT-SIDED PARACENTESIS MEDICATIONS: Lidocaine  1% 10 mL COMPLICATIONS: None immediate. PROCEDURE: Informed written consent was obtained from the patient after a discussion of the risks, benefits and  alternatives to treatment. A timeout was performed prior to the initiation of the procedure. Initial ultrasound scanning demonstrates a moderate amount of ascites within the right lower abdominal quadrant. The right lower abdomen was prepped and draped in the usual sterile fashion. 1% lidocaine  was used for local anesthesia. Following this, a 19 gauge, 7-cm, Yueh catheter was introduced. An ultrasound image was saved for documentation purposes. The paracentesis was performed. The catheter was removed and a dressing was applied. The patient tolerated the procedure well without immediate post procedural complication. FINDINGS: A total of approximately 4.4 L of straw color fluid was removed. Samples were sent to the laboratory as requested by the clinical team. IMPRESSION: Successful ultrasound-guided therapeutic and diagnostic right-sided paracentesis yielding 4.4 liters of peritoneal fluid. Performed by Delon Beagle NP PLAN: If the patient eventually requires >/=2 paracenteses in a 30 day period, candidacy for formal evaluation by the [facility] will be assessed. Electronically Signed   By: CHRISTELLA.  Shick M.D.   On: 08/15/2024 11:19   US  ASCITES (ABDOMEN LIMITED) Result Date: 08/13/2024 CLINICAL DATA:  Abdominal distension. EXAM: LIMITED ABDOMEN ULTRASOUND FOR ASCITES TECHNIQUE: Limited ultrasound survey for ascites was performed in all four abdominal quadrants. COMPARISON:  None Available. FINDINGS: Fluid is seen in all 4 quadrants.  Liver margin is irregular. IMPRESSION: 1. Large ascites. 2. Cirrhosis. Electronically Signed   By: Newell Eke M.D.   On: 08/13/2024 14:25   PERIPHERAL VASCULAR CATHETERIZATION Result Date: 08/12/2024 See surgical note for result.  DG Hand Complete Left Result Date: 08/11/2024 EXAM: 3 OR MORE VIEW(S) XRAY OF THE LEFT HAND 08/11/2024 05:18:00 PM COMPARISON: None available. CLINICAL HISTORY: Foreign body (FB) in soft tissue.  FINDINGS: BONES AND JOINTS: No acute fracture. Subluxation of the third metacarpophalangeal joint. Joint space narrowing of the third metacarpophalangeal joint, with erosive changes at the third metacarpal head and base of the third proximal phalanx, consistent with sequela of inflammatory or infectious arthropathy. SOFT TISSUES: Diffuse vascular calcifications. Diffuse soft tissue swelling of the third digit. No definitive radiopaque foreign body identified. IMPRESSION: 1. Diffuse soft tissue swelling of the third digit without a definite radiopaque foreign body. 2. Third metacarpophalangeal joint subluxation with erosive changes, most consistent with inflammatory or infectious arthropathy. 3. Diffuse vascular calcifications. Electronically signed by: Greig Pique MD MD 08/11/2024 08:06 PM EST RP Workstation: [id number]   MR HAND LEFT WO CONTRAST Result Date: 08/11/2024 EXAM: MR  LEFT HAND WITHOUT INTRAVENOUS CONTRAST 08/11/2024 03:16:04 PM TECHNIQUE: Multiplanar magnetic resonance images of the left hand without intravenous contrast. COMPARISON: None available. CLINICAL HISTORY: Nonhealing wound along the dorsal middle finger MCP joint, reduced range of motion of the middle finger. FINDINGS: LIMITATIONS/ARTIFACTS: Motion artifact is present, reducing diagnostic sensitivity and specificity. SOFT TISSUES: The dorsal soft tissue ulceration along the middle finger MCP joint region is shown on image 25 series 4. Low grade subcutaneous edema in the interosseous muscle adjacent to the distal middle finger metacarpal. JOINTS: Possible erosion of the dorsal base of the proximal phalanx with anterior subluxation of the proximal phalanx with respect to the metacarpal head. BONES: Underlying reported ulceration along the middle finger MCP joint, there is abnormal marrow edema in the base of the proximal phalanx and in the head of the middle finger metacarpal suspicious for active osteomyelitis. IMPRESSION: 1. Abnormal marrow  edema in the base of the proximal phalanx and in the head of the middle finger metacarpal, suspicious for active osteomyelitis, with overlying dorsal ulceration. 2. Erosion of the dorsal base of the proximal phalanx middle finger with anterior subluxation of the proximal phalanx with respect to the metacarpal head. 3. Low grade adjacent interosseous muscle and subcutaneous edema. Adjacent to the distal 3rd digit metacarpal. 4. Motion artifact reduces diagnostic sensitivity and specificity. Electronically signed by: Ryan Salvage MD MD 08/11/2024 03:47 PM EST RP Workstation: [id number]   MR FOOT LEFT WO CONTRAST Result Date: 08/11/2024 EXAM: MRI of the left Foot without contrast. 08/11/2024 03:00:40 PM TECHNIQUE: Multiplanar multisequence MRI of the left foot was performed without the administration of intravenous contrast. COMPARISON: 08/12/2023. CLINICAL HISTORY: left foot pain for 7 days with clinical suspicion for osteomyelitis. FINDINGS: LIMITATIONS/ARTIFACTS: Motion artifact is present, reducing diagnostic sensitivity and specificity. LISFRANC JOINT: Visualized Lisfranc ligament is intact. No significant Lisfranc interval widening or significant periligamentous edema. BONE MARROW: Small lateral erosion of the head of the 5th metatarsal laterally with adjacent marrow edema for example on image 14 series 8 and image 21 series 5, with thinning of the overlying superficial soft tissues in this vicinity raising the possibility of ulceration. Although the appearance is not classic for osteomyelitis, the presence of the immediately adjacent cutaneous defect in the vicinity of the osseous erosion does raise the possibility of early osteomyelitis in the head of the 5th metatarsal. No acute fracture or aggressive marrow replacing lesion. GREATER AND LESSER MTP JOINTS: Mild degenerative arthropathy at the 1st metatarsophalangeal joint. The 5th metatarsophalangeal joint demonstrates a small lateral erosion of the  head of the 5th metatarsal laterally with adjacent marrow edema, as described in the Bone Marrow section. Normal alignment. SOFT TISSUES: Thinning of the overlying superficial soft tissues in the vicinity of the 5th metatarsal head, raising the possibility of ulceration. Trace subcutaneous edema along the dorsum of the forefoot, cellulitis not excluded. TENDONS: Visualized flexor and extensor tendons are intact. IMPRESSION: 1. Small lateral erosion of the head of the 5th metatarsal with adjacent marrow edema and overlying soft tissue thinning/possible ulceration, raising concern for early osteomyelitis. 2. Trace subcutaneous edema along the dorsum of the forefoot, which can be seen with cellulitis. 3. Mild degenerative arthropathy at the 1st metatarsophalangeal joint. Electronically signed by: Ryan Salvage MD MD 08/11/2024 03:40 PM EST RP Workstation: [id number]   CT Angio Aortobifemoral W and/or Wo Contrast Result Date: 08/11/2024 CLINICAL DATA:  Ischemic left foot. End-stage renal disease on hemodialysis. EXAM: CT ANGIOGRAPHY OF ABDOMINAL AORTA WITH ILIOFEMORAL RUNOFF TECHNIQUE: Multidetector CT imaging of the  abdomen, pelvis and lower extremities was performed using the standard protocol during bolus administration of intravenous contrast. Multiplanar CT image reconstructions and MIPs were obtained to evaluate the vascular anatomy. RADIATION DOSE REDUCTION: This exam was performed according to the departmental dose-optimization program which includes automated exposure control, adjustment of the mA and/or kV according to patient size and/or use of iterative reconstruction technique. CONTRAST:  OMNIPAQUE  IOHEXOL  350 MG/ML SOLN COMPARISON:  Prior CT abdomen/pelvis 02/03/2022 FINDINGS: VASCULAR Aorta: Normal caliber aorta without aneurysm, dissection, vasculitis or significant stenosis. Celiac: Patent without evidence of aneurysm, dissection, vasculitis or significant stenosis. SMA: Patent without  evidence of aneurysm, dissection, vasculitis or significant stenosis. Renals: Hypoplastic renal arteries due to longstanding chronic renal failure and renal atrophy. IMA: Moderate to high-grade stenosis of the proximal IMA. The vessel remains patent. RIGHT Lower Extremity Inflow: Common, internal and external iliac arteries are patent without evidence of aneurysm, dissection, vasculitis or significant stenosis. Outflow: Common, superficial and profunda femoral arteries and the popliteal artery are patent without evidence of aneurysm, dissection, vasculitis or significant stenosis. Runoff: Limited evaluation of the runoff arteries due to extensive medial arteriosclerosis. The circumferential calcium  in the small vessels limits evaluation of the arterial lumen. LEFT Lower Extremity Inflow: Common, internal and external iliac arteries are patent without evidence of aneurysm, dissection, vasculitis or significant stenosis. Outflow: Common, superficial and profunda femoral arteries and the popliteal artery are patent without evidence of aneurysm, dissection, vasculitis or significant stenosis. Runoff: Limited evaluation of the runoff arteries due to extensive medial arteriosclerosis. The circumferential calcium  in the small vessels limits evaluation of the arterial lumen. Veins: Limited evaluation given arterial phase timing of the exam. There is peripheral calcification of the inferior vena cava and bilateral iliac veins which may represent chronic calcinosis, or potentially sequelae of prior ileo caval DVT. Changes are stable over many years. Review of the MIP images confirms the above findings. NON-VASCULAR Lower chest: No acute abnormality. Mild cardiomegaly. Calcifications along the coronary arteries. Large para esophageal varices. Hepatobiliary: Advanced hepatic cirrhosis. No arterially enhancing lesion to suggest the presence of hepatocellular carcinoma. Gallbladder is unremarkable. No intra or extrahepatic  biliary ductal dilatation. Pancreas: Unremarkable. No pancreatic ductal dilatation or surrounding inflammatory changes. Spleen: Mild splenomegaly. Adrenals/Urinary Tract: Unremarkable adrenal glands. Atrophied native kidneys. The bladder is decompressed. Stomach/Bowel: No focal bowel wall thickening or evidence of obstruction. Lymphatic: No suspicious lymphadenopathy. Reproductive: Unremarkable prostate gland. Other: Moderate ascites. Musculoskeletal: No acute fracture or aggressive appearing lytic or blastic osseous lesion. IMPRESSION: VASCULAR 1. No evidence of hemodynamically significant stenosis or occlusive disease in the inflow (aortoiliac) or outflow (femoropopliteal) segments. Evaluation of the runoff (infrageniculate) arteries is limited by severe medial arteriosclerosis. The circumferential calcification of the medium and small artery walls creates streak artifact which severely limits evaluation of the internal lumens. Catheter directed angiography could be considered if clinically warranted. 2. Extensive peripheral calcified atherosclerotic plaque. 3. Extensive peripheral calcification of the inferior vena cava and bilateral iliac vessels, similar compared to prior. 4. No evidence of arterial aneurysm or dissection. NON-VASCULAR 1. Severe hepatic cirrhosis with splenomegaly, esophageal and paraesophageal varices and moderate ascites. 2. No acute abnormality within the abdomen or pelvis. 3. Similar appearance of atrophic native kidneys. Electronically Signed   By: Wilkie Lent M.D.   On: 08/11/2024 10:25   US  Venous Img Lower Unilateral Left Result Date: 08/11/2024 EXAM: ULTRASOUND DUPLEX OF THE LEFT LOWER EXTREMITY VEINS TECHNIQUE: Duplex ultrasound using B-mode/gray scaled imaging and Doppler spectral analysis and color flow was  obtained of the deep venous structures of the left lower extremity. COMPARISON: None available. CLINICAL HISTORY: 54 year old male with lower extremity pain and color  changes. FINDINGS: The common femoral vein, femoral vein, popliteal vein, and visible calf veins demonstrate normal compressibility with normal color flow and spectral analysis. Evidence of calcified peripheral vascular disease. The right contralateral common femoral vein appears patent and unremarkable. IMPRESSION: 1. No evidence of left lower extremity DVT. 2. Suspect  calcified peripheral vascular disease in the left lower extremity. Electronically signed by: Helayne Hurst MD MD 08/11/2024 09:35 AM EST RP Workstation: [id number]   DG Foot 2 Views Left Result Date: 08/11/2024 EXAM: 2 VIEW(S) XRAY OF THE LEFT FOOT 08/11/2024 07:32:00 AM COMPARISON: CT left foot 01/22/2018. CLINICAL HISTORY: pain pain FINDINGS: BONES AND JOINTS: Osteopenia. There is a chronic healed fracture deformity of the neck of the 5th metatarsal. No acute fracture. No findings of acute osteomyelitis. No malalignment. Arthritic changes are not seen. SOFT TISSUES: There are extensive vascular calcifications of the distal foreleg and foot. Mild generalized edema. There is mild fusiform thickening in the distal achilles tendon. There is no visible soft tissue gas or foreign body. IMPRESSION: 1. No acute osseous abnormality. 2. Extensive vascular calcifications in the distal foreleg and foot. 3. Soft tissue swelling. 4. Osteopenia. Electronically signed by: Francis Quam MD 08/11/2024 07:40 AM EST RP Workstation: [id number]    Microbiology: Results for orders placed or performed during the hospital encounter of 08/11/24  Blood culture (routine x 2)     Status: None   Collection Time: 08/11/24  7:15 AM   Specimen: BLOOD  Result Value Ref Range Status   Specimen Description BLOOD RIGHT ANTECUBITAL  Final   Special Requests   Final    BOTTLES DRAWN AEROBIC AND ANAEROBIC Blood Culture adequate volume   Culture   Final    NO GROWTH 5 DAYS Performed at [facility], [address] [place], [place] [address]    Report Status  08/16/2024 FINAL  Final  Blood culture (routine x 2)     Status: None   Collection Time: 08/11/24  7:26 AM   Specimen: BLOOD  Result Value Ref Range Status   Specimen Description BLOOD BLOOD RIGHT HAND  Final   Special Requests   Final    BOTTLES DRAWN AEROBIC AND ANAEROBIC Blood Culture results may not be optimal due to an inadequate volume of blood received in culture bottles   Culture   Final    NO GROWTH 5 DAYS Performed at [facility], [address] [place], [place] [address]    Report Status 08/16/2024 FINAL  Final  MRSA Next Gen by PCR, Nasal     Status: None   Collection Time: 08/11/24  4:00 PM   Specimen: Nasal Mucosa; Nasal Swab  Result Value Ref Range Status   MRSA by PCR Next Gen NOT DETECTED NOT DETECTED Final    Comment: (NOTE) The GeneXpert MRSA Assay (FDA approved for NASAL specimens only), is one component of a comprehensive MRSA colonization surveillance program. It is not intended to diagnose MRSA infection nor to guide or monitor treatment for MRSA infections. Test performance is not FDA approved in patients less than 80 years old. Performed at [facility], [address] [place], [place] [address]     Labs: CBC: Recent Labs  Lab 08/13/24 0636 08/14/24 0639 08/15/24 1407 08/16/24 0535 08/17/24 0430 08/18/24 0537  WBC 4.3 3.7* 3.9* 4.4 4.4 4.6  NEUTROABS 3.0  --  2.7  --  2.9 2.8  HGB 11.6* 12.2* 11.9* 12.7* 12.6* 12.5*  HCT 36.1* 37.4* 36.7* 38.7* 38.2* 39.1  MCV 104.9* 102.2* 101.7* 101.0* 100.8* 102.1*  PLT 91* 90* 87* 86* 84* 85*   Basic Metabolic Panel: Recent Labs  Lab 08/12/24 0600 08/13/24 0636 08/14/24 0639 08/16/24 0535 08/18/24 0537  NA 134* 133* 132* 135 133*  K 5.4* 4.9 4.2 4.2 4.3  CL 96* 94* 93* 90* 91*  CO2 22 25 27 29 29   GLUCOSE 72 80 79 87 81  BUN 46* 36* 25* 40* 29*  CREATININE 8.16* 6.57* 5.17* 7.70* 6.73*  CALCIUM  9.7 9.5 9.8  9.7 9.5 9.5  PHOS  --  6.2* 5.3*  --   --    Liver Function  Tests: Recent Labs  Lab 08/16/24 0535  AST 41  ALT 10  ALKPHOS 171*  BILITOT 1.0  PROT 6.6  ALBUMIN 3.4*   CBG: No results for input(s): GLUCAP in the last 168 hours.  Discharge time spent:  38 minutes.  Signed: Drue ONEIDA Potter, MD [facility] 08/18/2024 "

## 2024-08-18 NOTE — Discharge Instructions (Addendum)
 Food Resources  Agency Name: [facility] Agency Address: [address] [place], [place] [address] Phone: [phone] Website: [email or web address] Service(s) Offered: Housing services, self-sufficiency, congregate meal program, weatherization program, event organiser program, emergency food assistance,  housing counseling, home ownership program, wheels - to work program.  [employer] free for 60 and older at various locations from usaa, Monday-Friday:  [employer], [address] [place], [phone] -[facility] [facility] [name], [place] [phone]  -[facility], [address] [place] [phone]  -[address] [place], [phone]  Agency Name: [facility] Address: [phone] [address], [place], [place] [address] Phone: [phone] Website: www.[email or web address] Service(s) Offered: Home delivered hot, frozen, and emergency  meals. Grocery assistance program which matches  volunteers one-on-one with seniors unable to grocery shop  for themselves. Must be 60 years and older; less than 20  hours of in-home aide service, limited or no driving ability;  live alone or with someone with a disability; live in  [place].  Agency Name: [facility]) Address: [address], [place], [place] [address] Phone: [phone] Service(s) Offered: Food is served to shut-ins, homeless, elderly, and low income people in the community every Saturday (11:30 am-12:30 pm) and Sunday (12:30 pm-1:30pm). Volunteers also offer help and encouragement in seeking employment,  and spiritual guidance.  Agency Name: Department of Social Services Address: [address] [place], [place] [address] Phone: [phone] Service(s) Offered: Child support services; child welfare services; food stamps; Medicaid; work first family assistance; and aid  with fuel,  rent, food and medicine.  Agency Name: [facility]itian Address: [address] [place], [place] Phone: [phone] Website: [email or web address] Services Offered: Monday 10:00am-12:00, 8:00pm-9:00pm, and Friday 10:00am-12:00.  Agency Name: [facility] Address: [address] [place], [place] [address] Phone: [phone] Website: www.[email or web address].org Service(s) Offered: Serves weekday meals, open from 11:30 am- 1:00 pm., and 6:30-7:30pm, Monday-Wednesday-Friday distributes food 3:30-6pm, Monday-Wednesday-Friday.  Agency Name: [facility] Address: [address] [place], [place] Phone: [phone] Website: [email or web address] Services Offered: Distributes food the 4th Saturday of the month, starting at 8:00 am  Agency Name: [facility] Address: [phone] [address] [place], [place] [address] Phone: [phone] Website: [email or web address] Service(s) Offered: Bread of life, weekly food pantry. Open Wednesdays from 10:00am-noon.  Agency Name: [facility] [employer] [facility] Address: [address] [place], [place], [place] Phone: [phone] Services Offered: Distributes food 9am-1pm, Monday-Thursday. Call for details.  Agency Name: [facility] Address: [address] [place], [place] [address] Phone: [phone] Website: firstbaptistburlington.[email or web address] Service(s) Offered: Games Developer. Call for assistance.  Agency Name: [facility] Address: [address], [place], [place] [address] Phone: [phone] Service Offered: Emergency Food Pantry. Call for appointment.  Agency Name: [facility] Address: [address] [place], [place] [address] Phone: [phone] Website: [email or web address] Services Offered: Games Developer. Call for details  Agency Name: [facility] Address: [address] [place], [place] Phone:  [phone] Website: [email or web address] Service(s) Offered: Emergency Food Pantry. Call for details.  Agency Name: [facility] Address: [address], [place], [place] [address] Phone: [phone] or [phone] Website: [email or web address] Service(s) Offered: Distribute food 9am-11:30 am, Tuesday-Friday, and 1-3:30pm, Monday-Friday. Food pantry Monday-Friday 1pm-3pm, fresh items, Mon.-Wed.-Fri.  Agency Name: [facility] Address: [address] [place], [place] [address] Phone: [phone] Website: www.safealamance.org Services Offered: Distribute food Tues and Sats from 9:00am-noon.  Closed 1st Saturday of each month. Call for details  Agency Name: [facility] [employer] Address: [address] [facility] [address] [place] [address] Phone: [phone]  Services Offered: Delivers meals every Thursday   Rent/Utility/Housing  Agency Name: [facility] Agency Address: [address] [place], [place] [address] Phone: [phone] Email: [email or web address] Website: [email or web address] Service(s) Offered: Housing services, self-sufficiency, congregate meal program, weatherization program, field seismologist program, emergency food assistance,  housing counseling, home ownership program, wheels -towork program.  Agency Name: [facility] Address: [address], [place], [place] [address] Phone: [phone] (8a-4p) [phone] (8p- 10p) Email: [email or web address] Website: [email or web address] Service(s) Offered: A program for homeless and/or needy men that includes one-on-one counseling, life skills training and job rehabilitation.  Agency Name: [facility] Address: [address], [place], [place] [address] Phone: [phone] Website: www.[email or web address].org Service(s) Offered: Assistance to needy in emergency with  utility bills, heating fuel, and prescriptions. Shelter for homeless 7pm-7am. November 27, 2016 15  Agency Name: [facility] [place] (Developmentally Disabled) Address: [address], [place], [place] [address] Phone: [phone] Contact Person: Lemond Cart Email: [email or web address] Website: [email or web address] Service(s) Offered: Helps individuals with developmental disabilities move from housing that is more restrictive to homes where they  can achieve greater independence and have more  opportunities.  Agency Name: [facility] Address: [address], [place], [place] [address] Phone: [phone] Email: [email or web address] Website: www.[email or web address].org Service(s) Offered: Provides affordable housing for low-income families, elderly, and disabled individuals. Offer a wide range of  programs and services, from financial planning to afterschool and summer programs.  Agency Name: Department of Social Services Address: [address] [place], [place] [address] Phone: [phone] Service(s) Offered: Child support services; child welfare services; food stamps; Medicaid; work first family assistance; and aid with fuel,  rent, food and medicine.  Agency Name: [facility] [place] [employer]. Address: [facility] [address] [place], [place]  [address] Phone: [phone] Website: www.familyabuseservices.org Service(s) Offered: 24 hour Crisis Line: [phone]; 24 hour Emergency Shelter; Transitional Housing; Support Groups; Scientist, Physiological; [employer]; Hispanic Outreach: [phone];  Visitation Center: [phone].  Agency Name: [facility] [place]. Address: [address] [place], [place] [address] Phone: [phone] Service(s) Offered: CAP Services; Home and [employer]; Individual or Group Supports; Respite Care Non-Institutional Nursing;  Residential Supports; Respite Care and Personal Care  Services; Transportation; Family and Friends Night; Recreational Activities; Three Nutritious Meals/Snacks; Consultation with Registered Dietician; Twenty-four hour Registered Nurse Access; Daily and [employer]; Camp Green Leaves; [place] for the [employer] (During Summer Months) Bingo Night (Every  Wednesday Night); Special Populations Dance Night  (Every Tuesday Night); Professional Hair Care Services.  Agency Name: [facility] Address: [address], [place], [place] [address] Phone: [phone] Contact Person: Meade High Website: [email or web address].[email or web address], Medical) Offered: Residential treatment facility for women; food and  clothing, educational & employment development and  transportation to work; counsellor of financial skills;  parenting and family reunification; emotional and spiritual  support; transitional housing for program graduates.  Agency Name: [facility] Address: [address], [place], [place] [address] Phone: [phone] Email: [email or web address] Website: [email or web address] Service(s) Offered: Public housing units for elderly, disabled, and low income people; housing choice vouchers for income eligible  applicants; shelter plus care vouchers; and Psychologist, Clinical.  Agency Name: [facility] [employer] Address: [address], [place], [place] [address] Phone: [phone] Email: [email or web address] Website: www.habitatalamance.org  Service(s) Offered: Build houses for families in need of decent housing. Each adult in the family must invest 200 hours of labor on  someone elses house, work with volunteers to build their own house, attend classes on budgeting, home maintenance, yard care, and attend homeowner association meetings.  Agency Name: [facility] Address: [address] [place], [place] [address] Phone: [phone] Website:  www.rsli.org Service(s) Offered: Intermediate care facilities for intellectually delayed, Supervised Living in group homes for adults with developmental disabilities, Supervised Living for people who have dual diagnoses (MRMI), Independent Living, Supported Living, respite and a variety of CAP services, pre-vocational services, day supports, and [employer].  Agency Name: [place] [facility] Phone: [phone] Website: www.NCForeclosurePrevention.gov Service(s) Offered: Zero-interest, deferred loans to homeowners struggling to pay their mortgage. Call for more information.  Agency Name: [facility] Agency Address: [address], [place], [place] [address] Phone: [phone] Website: www.alamanceservices.org Service(s) Offered: Housing services, self-sufficiency, congregate meal program, and individual development account program.  Agency Name: [facility] Address: [address], [place], [place] [address] Phone: [phone] Email: [email or web address] Website: www.alliedchurches.org Service(s) Offered: Housing the homeless, feeding the hungry, company secretary, job and education related services.  Agency Name: [facility] Address: [address] [place], [place] [address] Phone: [phone] Email: [email or web address] Service(s) Offered: Counseling, problem pregnancy, advocacy for Hispanics, limited emergency financial assistance.  Agency Name: Department of Social Services Address: [address] [place], [place] [address] Phone: [phone] Website: [email or web address].com/dss Service(s) Offered: Child support services; child welfare services; SNAP; Medicaid; work first family assistance; and aid with fuel,  rent, food and medicine.  Agency Name: [facility] Address: [address], [place], [place] [address] Phone: [phone] or [phone] Email:  [email or web address] Service(s) Offered: Family services and transient assistance; emergency food, fuel, clothing, limited furniture, utilities; budget counseling, general counseling; give a kid a coat; thrift store; Christmas food and toys. Utility assistance, food pantry, rental  assistance, life sustaining medicine    Transportation Resources for [employer]  Agency Name: [facility] Agency Address: [address] [place], [place] [address] Phone: [phone] Email: [email or web address] Website: www.alamanceservices.org Service(s) Offered: Housing services, self-sufficiency, congregate meal program, weatherization program, field seismologist program, emergency food assistance,  housing counseling, home ownership program, wheels-towork program.  Agency Name: [facility] [phone]) Address: [address], [place], [place] [address] Phone: [phone] Website: www.acta-[email or web address] Service(s) Offered: Transportation for bluelinx, subscription and demand response; Dial-a-Ride for citizens 70 years of age or older.  Agency Name: Department of Social Services Address: [address] [place], [place] [address] Phone: [phone] Service(s) Offered: Child support services; child welfare services; food stamps; Medicaid; work first family assistance; and aid with fuel,  rent, food and medicine, transportation assistance.  Agency Name: Disabled [employer] (DAV) Transportation  Network Phone: [phone] Service(s) Offered: Transports veterans to the [facility]. Call  forty-eight hours in advance and leave the name, telephone  number, date, and time of appointment. Veteran will be  contacted by the driver the day before the appointment to  arrange a pick up point    [employer] ACTA currently provides door to door  services. ACTA connects with PART daily for services to [facility]. ACTA also performs contract services to [employer] operates 27 vehicles, all but 3 mini-vans are equipped with lifts for special needs as well as the general public. ACTA drivers are each CDL  certified and trained in First Aid and CPR. ACTA was established in 2002 by [employer]. An independent Industrial/product Designer. ACTA operates via cytogeneticist with required local 10% match funding from [place]. ACTA provides over 80,000 passenger trips each year, including [facility] and [employer] sites.  Call at least by 11 AM one business day prior to needing transportation  [employer].                      [place], [place] [address]     Office Hours: Monday-Friday  8 AM - 5 PM  Agency Name: [facility] Agency Address: [address], [place], [place] [address] Phone: [phone] Website: www.alamanceservices.org Service(s) Offered: Housing services, self-sufficiency, congregate meal program, and individual development account program.  Agency Name: [facility] [place] Address: [address] [place], [place] [address] Phone: [phone] Email: [email or web address] Website: www.alliedchurches.org Service(s) Offered: Housing the homeless, feeding the hungry, company secretary, job and education related services.  Agency Name: [facility] Address: [address] [place], [place] [address] Phone: [phone] Email: [email or web address] Service(s) Offered: Counseling, problem pregnancy, advocacy for Hispanics, limited emergency financial assistance.  Agency Name: Department of Social Services Address: [address] [place], [place] [address] Phone: [phone] Website: www.[email or web address].[email or web address]/dss Service(s) Offered: Child support services; child  welfare services; SNAP; Medicaid; work first family assistance; and aid with fuel,  rent, food and medicine.  Agency Name: [facility] Address: [address], [place], [place] [address] Phone: [phone] or [phone] Email: [email or web address] Service(s) Offered: Family services and transient assistance; emergency food, fuel, clothing, limited furniture, utilities; budget counseling, general counseling; give a kid a coat; thrift store; Christmas food and toys. Utility assistance, food pantry, rental  assistance, life sustaining medicine

## 2024-08-18 NOTE — Progress Notes (Signed)
 SPIRITUAL CARE AND COUNSELING CONSULT NOTE   VISIT SUMMARY Chaplain provided spiritual/emotional support to [facility] while rounding on unit. Chaplain consulted with nurse team.   Mario [name]                                                                                                                                                                      Type of Visit: Initial Care provided to:: Patient Conversation partners present during encounter: Nurse Referral source: Chaplain assessment Reason for visit: Routine spiritual support OnCall Visit: No   SPIRITUAL FRAMEWORK  Presenting Themes: Goals in life/care, Meaning/purpose/sources of inspiration, Community and relationships Community/Connection: Family, Faith community Patient Stress Factors: None identified Family Stress Factors: None identified   GOALS   Self/Personal Goals: healing, walker boot support Clinical Care Goals: healing, walker boot support   INTERVENTIONS   Spiritual Care Interventions Made: Explored values/beliefs/practices/strengths, Compassionate presence, Explored ethical dilemma    INTERVENTION OUTCOMES   Outcomes: Autonomy/agency, Awareness of support, Connection to values and goals of care  Chaplain provided compassionate presence, reflective listening, and meaning oriented conversation to elicit Mario [name]'s feelings about his current health status, treatment plan, Catholic faith tradition, his family support, and desired outcomes. Chaplain advocated to staff for patient's desires.   SPIRITUAL CARE PLAN   Spiritual Care Issues Still Outstanding: Chaplain will continue to follow    If immediate needs arise, please contact [facility] 24 hour on call [phone]   Barabara Chess, Chaplain  08/18/2024 3:35 PM

## 2024-08-18 NOTE — Progress Notes (Signed)
 Discharge instructions were reviewed with patient in spanish. Questions were encouraged and answered. IV was removed. Rolator was delivered to the room and transport was called for patient. Wound supplies and teaching were provided

## 2024-08-18 NOTE — TOC Transition Note (Signed)
 Transition of Care [facility]) - Discharge Note   Patient Details  Name: Mario [name] MRN: [id number] Date of Birth: 09/03/1970  Transition of [facility]) CM/SW Contact:  Corean ONEIDA Haddock, RN Phone Number: 08/18/2024, 3:36 PM   Clinical Narrative:    Late Entry: Met with patient along with [facility] spanish interpreter at bedside Patient states that he lives at home alone.  States he resides in a trailer.  He states that his home country is [place], but that he has lived in the [place]  for 17 years.  He states that most of his family is in [place], however he has a brother and sister that are still in [place].   Patient uses [facility] to get to and from HD.  Provided patient with Spanish information for [facility], to establish PCP  Food, housing, transportation, utility resources added to AVS  Patient states he goes to stores to bed for food and Money.  Patient states that his cousin Sheree and niece Estefana Arabia both work, and provide assurance when they are able.  Patient confirms he has electricy and water in the home  Patient has Dr. Ona Roch PA, [facility] appointment on Thursday Jan 29 at 10:30. [facility]. [address], [place] [address].  I stressed the importance of him getting to the point in order for them to be able to address his hand.  Patient states that he will save up enough money to afford a cab for the appointment.  He also sates that he will check to see if Sheree can provide transport  Patient to dc today. Charity RW to be delivered to the room prior to dc by [employer].   Provided bedside RN cab voucher, and she is to have patient sign the rider waiver. Bedside RN to follow up to determine if meds to bed is indicated          Patient Goals and CMS Choice            Discharge Placement                       Discharge Plan and Services Additional resources added to the After Visit Summary for                                        Social Drivers of Health (SDOH) Interventions SDOH Screenings   Food Insecurity: Food Insecurity Present (08/11/2024)  Housing: High Risk (08/11/2024)  Transportation Needs: Unmet Transportation Needs (08/11/2024)  Utilities: At Risk (08/11/2024)  Social Connections: Moderately Isolated (08/11/2024)  Tobacco Use: High Risk (08/11/2024)     Readmission Risk Interventions     No data to display

## 2024-08-29 ENCOUNTER — Emergency Department: Payer: MEDICAID

## 2024-08-29 ENCOUNTER — Inpatient Hospital Stay
Admission: EM | Admit: 2024-08-29 | Discharge: 2024-09-02 | DRG: 264 | Disposition: A | Discharge status: Home / Self Care | Payer: MEDICAID | Attending: Student | Admitting: Student

## 2024-08-29 ENCOUNTER — Observation Stay: Payer: MEDICAID

## 2024-08-29 ENCOUNTER — Other Ambulatory Visit: Payer: Self-pay

## 2024-08-29 DIAGNOSIS — Z7902 Long term (current) use of antithrombotics/antiplatelets: Secondary | ICD-10-CM

## 2024-08-29 DIAGNOSIS — N186 End stage renal disease: Secondary | ICD-10-CM | POA: Diagnosis present

## 2024-08-29 DIAGNOSIS — T07XXXA Unspecified multiple injuries, initial encounter: Secondary | ICD-10-CM

## 2024-08-29 DIAGNOSIS — D6959 Other secondary thrombocytopenia: Secondary | ICD-10-CM | POA: Diagnosis present

## 2024-08-29 DIAGNOSIS — R188 Other ascites: Secondary | ICD-10-CM

## 2024-08-29 DIAGNOSIS — Z79899 Other long term (current) drug therapy: Secondary | ICD-10-CM

## 2024-08-29 DIAGNOSIS — Z59819 Housing instability, housed unspecified: Secondary | ICD-10-CM

## 2024-08-29 DIAGNOSIS — E785 Hyperlipidemia, unspecified: Secondary | ICD-10-CM | POA: Diagnosis present

## 2024-08-29 DIAGNOSIS — Z8419 Family history of other disorders of kidney and ureter: Secondary | ICD-10-CM

## 2024-08-29 DIAGNOSIS — E559 Vitamin D deficiency, unspecified: Secondary | ICD-10-CM | POA: Diagnosis present

## 2024-08-29 DIAGNOSIS — I509 Heart failure, unspecified: Secondary | ICD-10-CM

## 2024-08-29 DIAGNOSIS — I132 Hypertensive heart and chronic kidney disease with heart failure and with stage 5 chronic kidney disease, or end stage renal disease: Principal | ICD-10-CM | POA: Diagnosis present

## 2024-08-29 DIAGNOSIS — J810 Acute pulmonary edema: Principal | ICD-10-CM | POA: Diagnosis present

## 2024-08-29 DIAGNOSIS — G8929 Other chronic pain: Secondary | ICD-10-CM | POA: Diagnosis present

## 2024-08-29 DIAGNOSIS — L97529 Non-pressure chronic ulcer of other part of left foot with unspecified severity: Secondary | ICD-10-CM | POA: Diagnosis present

## 2024-08-29 DIAGNOSIS — D539 Nutritional anemia, unspecified: Secondary | ICD-10-CM | POA: Diagnosis present

## 2024-08-29 DIAGNOSIS — K766 Portal hypertension: Secondary | ICD-10-CM | POA: Diagnosis present

## 2024-08-29 DIAGNOSIS — I5033 Acute on chronic diastolic (congestive) heart failure: Secondary | ICD-10-CM | POA: Diagnosis present

## 2024-08-29 DIAGNOSIS — F17210 Nicotine dependence, cigarettes, uncomplicated: Secondary | ICD-10-CM | POA: Diagnosis present

## 2024-08-29 DIAGNOSIS — Z7982 Long term (current) use of aspirin: Secondary | ICD-10-CM

## 2024-08-29 DIAGNOSIS — N2581 Secondary hyperparathyroidism of renal origin: Secondary | ICD-10-CM | POA: Diagnosis present

## 2024-08-29 DIAGNOSIS — D631 Anemia in chronic kidney disease: Secondary | ICD-10-CM | POA: Diagnosis present

## 2024-08-29 DIAGNOSIS — E875 Hyperkalemia: Secondary | ICD-10-CM | POA: Diagnosis present

## 2024-08-29 DIAGNOSIS — M79672 Pain in left foot: Secondary | ICD-10-CM | POA: Diagnosis present

## 2024-08-29 DIAGNOSIS — Z59868 Other specified financial insecurity: Secondary | ICD-10-CM

## 2024-08-29 DIAGNOSIS — Z992 Dependence on renal dialysis: Secondary | ICD-10-CM

## 2024-08-29 DIAGNOSIS — Z981 Arthrodesis status: Secondary | ICD-10-CM

## 2024-08-29 DIAGNOSIS — Z883 Allergy status to other anti-infective agents status: Secondary | ICD-10-CM

## 2024-08-29 DIAGNOSIS — Z833 Family history of diabetes mellitus: Secondary | ICD-10-CM

## 2024-08-29 DIAGNOSIS — G4700 Insomnia, unspecified: Secondary | ICD-10-CM | POA: Diagnosis present

## 2024-08-29 DIAGNOSIS — Z5941 Food insecurity: Secondary | ICD-10-CM

## 2024-08-29 DIAGNOSIS — K7031 Alcoholic cirrhosis of liver with ascites: Secondary | ICD-10-CM | POA: Diagnosis present

## 2024-08-29 DIAGNOSIS — I739 Peripheral vascular disease, unspecified: Secondary | ICD-10-CM | POA: Diagnosis present

## 2024-08-29 DIAGNOSIS — Z5982 Transportation insecurity: Secondary | ICD-10-CM

## 2024-08-29 DIAGNOSIS — Z9862 Peripheral vascular angioplasty status: Secondary | ICD-10-CM

## 2024-08-29 DIAGNOSIS — Z5948 Other specified lack of adequate food: Secondary | ICD-10-CM

## 2024-08-29 LAB — COMPREHENSIVE METABOLIC PANEL WITH GFR
ALT: 23 U/L (ref 0–44)
AST: 57 U/L — ABNORMAL HIGH (ref 15–41)
Albumin: 3.8 g/dL (ref 3.5–5.0)
Alkaline Phosphatase: 211 U/L — ABNORMAL HIGH (ref 38–126)
Anion gap: 13 (ref 5–15)
BUN: 46 mg/dL — ABNORMAL HIGH (ref 6–20)
CO2: 25 mmol/L (ref 22–32)
Calcium: 9.1 mg/dL (ref 8.9–10.3)
Chloride: 97 mmol/L — ABNORMAL LOW (ref 98–111)
Creatinine, Ser: 6.16 mg/dL — ABNORMAL HIGH (ref 0.61–1.24)
GFR, Estimated: 10 mL/min — ABNORMAL LOW
Glucose, Bld: 88 mg/dL (ref 70–99)
Potassium: 6.9 mmol/L (ref 3.5–5.1)
Sodium: 135 mmol/L (ref 135–145)
Total Bilirubin: 0.7 mg/dL (ref 0.0–1.2)
Total Protein: 7.3 g/dL (ref 6.5–8.1)

## 2024-08-29 LAB — CBC WITH DIFFERENTIAL/PLATELET
Abs Immature Granulocytes: 0.03 10*3/uL (ref 0.00–0.07)
Basophils Absolute: 0.1 10*3/uL (ref 0.0–0.1)
Basophils Relative: 2 %
Eosinophils Absolute: 0.3 10*3/uL (ref 0.0–0.5)
Eosinophils Relative: 5 %
HCT: 37.5 % — ABNORMAL LOW (ref 39.0–52.0)
Hemoglobin: 11.6 g/dL — ABNORMAL LOW (ref 13.0–17.0)
Immature Granulocytes: 1 %
Lymphocytes Relative: 12 %
Lymphs Abs: 0.7 10*3/uL (ref 0.7–4.0)
MCH: 32.6 pg (ref 26.0–34.0)
MCHC: 30.9 g/dL (ref 30.0–36.0)
MCV: 105.3 fL — ABNORMAL HIGH (ref 80.0–100.0)
Monocytes Absolute: 0.8 10*3/uL (ref 0.1–1.0)
Monocytes Relative: 14 %
Neutro Abs: 3.7 10*3/uL (ref 1.7–7.7)
Neutrophils Relative %: 66 %
Platelets: 113 10*3/uL — ABNORMAL LOW (ref 150–400)
RBC: 3.56 MIL/uL — ABNORMAL LOW (ref 4.22–5.81)
RDW: 14.4 % (ref 11.5–15.5)
WBC: 5.5 10*3/uL (ref 4.0–10.5)
nRBC: 0 % (ref 0.0–0.2)

## 2024-08-29 LAB — PROTIME-INR
INR: 1.2 (ref 0.8–1.2)
Prothrombin Time: 16 s — ABNORMAL HIGH (ref 11.4–15.2)

## 2024-08-29 LAB — BODY FLUID CELL COUNT WITH DIFFERENTIAL
Eos, Fluid: 0 %
Lymphs, Fluid: 40 %
Monocyte-Macrophage-Serous Fluid: 55 % (ref 50–90)
Neutrophil Count, Fluid: 5 % (ref 0–25)
Total Nucleated Cell Count, Fluid: 294 uL (ref 0–1000)

## 2024-08-29 LAB — PRO BRAIN NATRIURETIC PEPTIDE: Pro Brain Natriuretic Peptide: 27545 pg/mL — ABNORMAL HIGH

## 2024-08-29 LAB — HEPATITIS B SURFACE ANTIGEN: Hepatitis B Surface Ag: NONREACTIVE

## 2024-08-29 LAB — TROPONIN T, HIGH SENSITIVITY
Troponin T High Sensitivity: 98 ng/L — ABNORMAL HIGH (ref 0–19)
Troponin T High Sensitivity: 96 ng/L — ABNORMAL HIGH (ref 0–19)

## 2024-08-29 LAB — LIPASE, BLOOD: Lipase: 73 U/L — ABNORMAL HIGH (ref 11–51)

## 2024-08-29 LAB — GLUCOSE, CAPILLARY: Glucose-Capillary: 110 mg/dL — ABNORMAL HIGH (ref 70–99)

## 2024-08-29 LAB — PHOSPHORUS: Phosphorus: 4.7 mg/dL — ABNORMAL HIGH (ref 2.5–4.6)

## 2024-08-29 LAB — MAGNESIUM: Magnesium: 3 mg/dL — ABNORMAL HIGH (ref 1.7–2.4)

## 2024-08-29 MED ORDER — ONDANSETRON HCL 4 MG/2ML IJ SOLN
4.0000 mg | Freq: Once | INTRAMUSCULAR | Status: AC
  Administered 2024-08-29: 4 mg via INTRAVENOUS
  Filled 2024-08-29: qty 2

## 2024-08-29 MED ORDER — GABAPENTIN 100 MG PO CAPS
200.0000 mg | ORAL_CAPSULE | ORAL | Status: DC
  Administered 2024-08-30 – 2024-09-01 (×2): 200 mg via ORAL
  Filled 2024-08-29 (×2): qty 2

## 2024-08-29 MED ORDER — ATORVASTATIN CALCIUM 10 MG PO TABS
10.0000 mg | ORAL_TABLET | Freq: Every day | ORAL | Status: DC
  Administered 2024-08-29 – 2024-09-01 (×4): 10 mg via ORAL
  Filled 2024-08-29 (×4): qty 1

## 2024-08-29 MED ORDER — FUROSEMIDE 10 MG/ML IJ SOLN
80.0000 mg | Freq: Once | INTRAMUSCULAR | Status: DC
  Filled 2024-08-29: qty 8

## 2024-08-29 MED ORDER — LIDOCAINE HCL (PF) 1 % IJ SOLN
10.0000 mL | Freq: Once | INTRAMUSCULAR | Status: AC
  Administered 2024-08-29: 10 mL

## 2024-08-29 MED ORDER — SEVELAMER CARBONATE 800 MG PO TABS
800.0000 mg | ORAL_TABLET | Freq: Three times a day (TID) | ORAL | Status: DC
  Administered 2024-08-29 – 2024-09-02 (×9): 800 mg via ORAL
  Filled 2024-08-29 (×13): qty 1

## 2024-08-29 MED ORDER — ALBUTEROL SULFATE (2.5 MG/3ML) 0.083% IN NEBU
10.0000 mg | INHALATION_SOLUTION | Freq: Once | RESPIRATORY_TRACT | Status: AC
  Administered 2024-08-29: 10 mg via RESPIRATORY_TRACT
  Filled 2024-08-29: qty 12

## 2024-08-29 MED ORDER — ASPIRIN 81 MG PO TBEC
81.0000 mg | DELAYED_RELEASE_TABLET | Freq: Every day | ORAL | Status: DC
  Administered 2024-08-29 – 2024-09-01 (×4): 81 mg via ORAL
  Filled 2024-08-29 (×4): qty 1

## 2024-08-29 MED ORDER — AMLODIPINE BESYLATE 5 MG PO TABS
5.0000 mg | ORAL_TABLET | Freq: Every day | ORAL | Status: DC
  Administered 2024-08-29 – 2024-09-01 (×4): 5 mg via ORAL
  Filled 2024-08-29 (×4): qty 1

## 2024-08-29 MED ORDER — SODIUM ZIRCONIUM CYCLOSILICATE 10 G PO PACK
10.0000 g | PACK | Freq: Once | ORAL | Status: AC
  Administered 2024-08-29: 10 g via ORAL
  Filled 2024-08-29: qty 1

## 2024-08-29 MED ORDER — DEXTROSE 50 % IV SOLN
1.0000 | Freq: Once | INTRAVENOUS | Status: AC
  Administered 2024-08-29: 50 mL via INTRAVENOUS
  Filled 2024-08-29: qty 50

## 2024-08-29 MED ORDER — HYDROMORPHONE HCL 1 MG/ML IJ SOLN
0.5000 mg | Freq: Once | INTRAMUSCULAR | Status: AC
  Administered 2024-08-29: 0.5 mg via INTRAVENOUS
  Filled 2024-08-29: qty 0.5

## 2024-08-29 MED ORDER — CHLORHEXIDINE GLUCONATE CLOTH 2 % EX PADS
6.0000 | MEDICATED_PAD | Freq: Every day | CUTANEOUS | Status: DC
  Administered 2024-08-30 – 2024-09-02 (×4): 6 via TOPICAL
  Filled 2024-08-29: qty 6

## 2024-08-29 MED ORDER — HYDROMORPHONE HCL 1 MG/ML IJ SOLN
0.5000 mg | INTRAMUSCULAR | Status: DC | PRN
  Administered 2024-08-29 – 2024-09-01 (×13): 0.5 mg via INTRAVENOUS
  Filled 2024-08-29 (×12): qty 0.5

## 2024-08-29 MED ORDER — LACTULOSE 10 GM/15ML PO SOLN: 30.0000 g | Freq: Two times a day (BID) | ORAL | Status: DC | PRN

## 2024-08-29 MED ORDER — POLYETHYLENE GLYCOL 3350 17 G PO PACK
17.0000 g | PACK | Freq: Every day | ORAL | Status: DC
  Filled 2024-08-29 (×2): qty 1

## 2024-08-29 MED ORDER — NEPRO/CARBSTEADY PO LIQD: 237.0000 mL | Freq: Two times a day (BID) | ORAL | Status: DC

## 2024-08-29 MED ORDER — CALCIUM GLUCONATE-NACL 1-0.675 GM/50ML-% IV SOLN
1.0000 g | Freq: Once | INTRAVENOUS | Status: AC
  Administered 2024-08-29: 1000 mg via INTRAVENOUS
  Filled 2024-08-29: qty 50

## 2024-08-29 MED ORDER — IOHEXOL 300 MG/ML  SOLN
100.0000 mL | Freq: Once | INTRAMUSCULAR | Status: AC | PRN
  Administered 2024-08-29: 100 mL via INTRAVENOUS

## 2024-08-29 MED ORDER — FENTANYL CITRATE (PF) 50 MCG/ML IJ SOSY
50.0000 ug | PREFILLED_SYRINGE | Freq: Once | INTRAMUSCULAR | Status: AC
  Administered 2024-08-29: 50 ug via INTRAVENOUS
  Filled 2024-08-29: qty 1

## 2024-08-29 MED ORDER — CLOPIDOGREL BISULFATE 75 MG PO TABS
75.0000 mg | ORAL_TABLET | Freq: Every day | ORAL | Status: DC
  Administered 2024-08-29 – 2024-09-01 (×4): 75 mg via ORAL
  Filled 2024-08-29 (×4): qty 1

## 2024-08-29 MED ORDER — HEPARIN SODIUM (PORCINE) 5000 UNIT/ML IJ SOLN
5000.0000 [IU] | Freq: Two times a day (BID) | INTRAMUSCULAR | Status: DC
  Administered 2024-08-29 – 2024-09-01 (×7): 5000 [IU] via SUBCUTANEOUS
  Filled 2024-08-29 (×7): qty 1

## 2024-08-29 MED ORDER — INSULIN ASPART 100 UNIT/ML IV SOLN
10.0000 [IU] | Freq: Once | INTRAVENOUS | Status: AC
  Administered 2024-08-29: 10 [IU] via INTRAVENOUS
  Filled 2024-08-29: qty 10

## 2024-08-29 MED ORDER — MORPHINE SULFATE (PF) 2 MG/ML IV SOLN
2.0000 mg | Freq: Once | INTRAVENOUS | Status: AC
  Administered 2024-08-29: 2 mg via INTRAVENOUS
  Filled 2024-08-29: qty 1

## 2024-08-29 NOTE — ED Notes (Signed)
 Report to dialysis, pt to be going to dialysis shortly.

## 2024-08-29 NOTE — ED Notes (Signed)
 Pt taken to dialysis

## 2024-08-29 NOTE — Progress Notes (Signed)
 " [facility]  ROUNDING NOTE   Subjective:   Mario [name] is a 54 y.o. male with past medical history including hypertension, neuropathy, chronic liver cirrhosis, portal hypertension, and end-stage renal disease on hemodialysis. Patient seen in ED for abdominal pain, fever, and nausea. He has been admitted under observation for Hyperkalemia [E87.5] CHF (congestive heart failure) (HCC) [I50.9] Acute pulmonary edema (HCC) [J81.0] Other ascites [R18.8]  Patient is known to our practice and receives outpatient dialysis treatments at [facility] on a TTS schedule, overseen by [facility] physicians.Last treatment received on Thursday. Patient was recently admitted and treated for osteomyelitis. Patient seen and evaluated during dialysis.    HEMODIALYSIS FLOWSHEET:  Blood Flow Rate (mL/min): 349 mL/min Arterial Pressure (mmHg): -174.74 mmHg Venous Pressure (mmHg): 165.04 mmHg TMP (mmHg): 12.52 mmHg Ultrafiltration Rate (mL/min): 971 mL/min Dialysate Flow Rate (mL/min): 299 ml/min  States he's been having progressive abd pain for the past few days. Unable to remember when last BM. Poor oral intake due to nausea.   Labs on Ed arrival include potassium 6.9, BUN 44, magnesium 3.0, BNP greater than 27,000, and hemoglobin 11.6.  Chest x-ray shows pulmonary interstitial edema.  CT abdomen pelvis shows large volume abdominal and pelvic ascites likely related to portal hypertension from cirrhosis, no bowel obstruction.  We have been consulted to provide urgent dialysis due to hyperkalemia.   Objective:  Vital signs in last 24 hours:  Temp:  [97.2 F (36.2 C)-98.1 F (36.7 C)] 97.2 F (36.2 C) (01/26 0835) Pulse Rate:  [61-83] 61 (01/26 1000) Resp:  [11-25] 14 (01/26 1000) BP: (107-183)/(55-110) 107/55 (01/26 1000) SpO2:  [90 %-100 %] 100 % (01/26 1000) Weight:  [76.3 kg-82.1 kg] 82.1 kg (01/26 0835)  Weight change:  Filed Weights   08/29/24 0503 08/29/24 0830  08/29/24 0835  Weight: 76.3 kg 82.1 kg 82.1 kg    Intake/Output: No intake/output data recorded.   Intake/Output this shift:  No intake/output data recorded.  Physical Exam: General: NAD, resting comfortably  Head: Normocephalic  Eyes: Anicteric  Lungs:  normal effort, on room air  Heart: Regular rate  Abdomen:  Soft, nontender  Extremities:  No peripheral edema  Neurologic: Awake, alert  Skin: Warm,dry  Access: Lt AVF    Basic Metabolic Panel: Recent Labs  Lab 08/29/24 0516  NA 135  K 6.9*  CL 97*  CO2 25  GLUCOSE 88  BUN 46*  CREATININE 6.16*  CALCIUM  9.1  MG 3.0*  PHOS 4.7*    Liver Function Tests: Recent Labs  Lab 08/29/24 0516  AST 57*  ALT 23  ALKPHOS 211*  BILITOT 0.7  PROT 7.3  ALBUMIN 3.8   Recent Labs  Lab 08/29/24 0516  LIPASE 73*   No results for input(s): AMMONIA in the last 168 hours.  CBC: Recent Labs  Lab 08/29/24 0516  WBC 5.5  NEUTROABS 3.7  HGB 11.6*  HCT 37.5*  MCV 105.3*  PLT 113*    Cardiac Enzymes: No results for input(s): CKTOTAL, CKMB, CKMBINDEX, TROPONINI in the last 168 hours.  BNP: Invalid input(s): POCBNP  CBG: No results for input(s): GLUCAP in the last 168 hours.  Microbiology: Results for orders placed or performed during the hospital encounter of 08/11/24  Blood culture (routine x 2)     Status: None   Collection Time: 08/11/24  7:15 AM   Specimen: BLOOD  Result Value Ref Range Status   Specimen Description BLOOD RIGHT ANTECUBITAL  Final   Special Requests  Final    BOTTLES DRAWN AEROBIC AND ANAEROBIC Blood Culture adequate volume   Culture   Final    NO GROWTH 5 DAYS Performed at [facility], [address] [place], [place] [address]    Report Status 08/16/2024 FINAL  Final  Blood culture (routine x 2)     Status: None   Collection Time: 08/11/24  7:26 AM   Specimen: BLOOD  Result Value Ref Range Status   Specimen Description BLOOD BLOOD RIGHT HAND  Final    Special Requests   Final    BOTTLES DRAWN AEROBIC AND ANAEROBIC Blood Culture results may not be optimal due to an inadequate volume of blood received in culture bottles   Culture   Final    NO GROWTH 5 DAYS Performed at [facility], [address] [place], [place] [address]    Report Status 08/16/2024 FINAL  Final  MRSA Next Gen by PCR, Nasal     Status: None   Collection Time: 08/11/24  4:00 PM   Specimen: Nasal Mucosa; Nasal Swab  Result Value Ref Range Status   MRSA by PCR Next Gen NOT DETECTED NOT DETECTED Final    Comment: (NOTE) The GeneXpert MRSA Assay (FDA approved for NASAL specimens only), is one component of a comprehensive MRSA colonization surveillance program. It is not intended to diagnose MRSA infection nor to guide or monitor treatment for MRSA infections. Test performance is not FDA approved in patients less than 27 years old. Performed at [facility], [address] [place], [place] [address]     Coagulation Studies: Recent Labs    08/29/24 0528  LABPROT 16.0*  INR 1.2     Urinalysis: No results for input(s): COLORURINE, LABSPEC, PHURINE, GLUCOSEU, HGBUR, BILIRUBINUR, KETONESUR, PROTEINUR, UROBILINOGEN, NITRITE, LEUKOCYTESUR in the last 72 hours.  Invalid input(s): APPERANCEUR    Imaging: CT ABDOMEN PELVIS W CONTRAST Result Date: 08/29/2024 EXAM: CT ABDOMEN AND PELVIS WITH CONTRAST 08/29/2024 05:54:01 AM TECHNIQUE: CT of the abdomen and pelvis was performed with the administration of 100 mL of iohexol  (OMNIPAQUE ) 300 MG/ML solution. Multiplanar reformatted images are provided for review. Automated exposure control, iterative reconstruction, and/or weight-based adjustment of the mA/kV was utilized to reduce the radiation dose to as low as reasonably achievable. COMPARISON: 08/11/2024 CLINICAL HISTORY: Abdominal pain, acute, nonlocalized; Bowel obstruction suspected. FINDINGS: LOWER CHEST: Unchanged chronic left  pleural thickening and calcification. Mild interstitial edema identified within the visualized lung bases. LIVER: Advanced changes of liver cirrhosis. GALLBLADDER AND BILE DUCTS: Partially decompressed gallbladder. No biliary ductal dilatation. SPLEEN: The spleen is enlarged, measuring 16.3 cm. No focal splenic lesion identified. Perisplenic varicosities are identified. PANCREAS: No acute abnormality. ADRENAL GLANDS: Unchanged right adrenal nodule measuring 1.3 cm most consistent with a benign adenoma. No follow-up imaging recommended. KIDNEYS, URETERS AND BLADDER: End-stage kidneys with multiple cysts are again noted, unchanged in appearance from the previous exam. Decompressed bladder containing 5 mm stone and circumferential wall thickening, image 75/2. No stones in the ureters. No hydronephrosis. No perinephric or periureteral stranding. GI AND BOWEL: Stomach appears normal. No pathologic dilatation of the bowel loops to suggest bowel obstruction. The appendix is visualized and appears normal. Large hemorrhoid along the right lateral aspect of the rectum noted. PERITONEUM AND RETROPERITONEUM: Large volume of abdominal and pelvic ascites. No free air. No focal fluid collections. VASCULATURE: Aorta is normal in caliber. Aortic atherosclerosis. Extensive vascular calcifications identified. Large esophageal and retrocrural varicosities are identified. Perisplenic varicosities are also identified. Large pelvic varicosities  including large hemorrhoid along the right lateral aspect of the rectum noted. LYMPH NODES: No lymphadenopathy. REPRODUCTIVE ORGANS: Prostate gland is unremarkable. [name] AND SOFT TISSUES: No acute osseous findings. Bony stigmata of renal osteodystrophy. No focal soft tissue abnormality. IMPRESSION: 1. No evidence of bowel obstruction. 2. Large volume abdominal and pelvic ascites, most likely related to portal hypertension from cirrhosis; consider diagnostic paracentesis if not previously  established. 3. Cirrhosis with portal hypertension manifested by extensive esophageal/retrocrural/perisplenic/pelvic varices and splenomegaly. 4. Decompressed bladder with 5 mm calculus and circumferential wall thickening, which can be seen with cystitis or chronic outlet obstruction; urologic evaluation as indicated. Electronically signed by: Waddell Calk MD 08/29/2024 06:05 AM EST RP Workstation: [id number]   DG Chest Portable 1 View Result Date: 08/29/2024 EXAM: 1 VIEW XRAY OF THE CHEST 08/29/2024 05:13:00 AM COMPARISON: Chest radiographs 10/13/2023 and earlier. CLINICAL HISTORY: 54 year old male with shortness of breath after missed dialysis. FINDINGS: LUNGS AND PLEURA: Diffuse interstitial prominence with perihilar airspace opacities. No focal pulmonary opacity. No pleural effusion. No pneumothorax. HEART AND MEDIASTINUM: Cardiomegaly. Calcified aorta. [name] AND SOFT TISSUES: Cervical spine fusion hardware. No acute osseous abnormality. ABDOMEN: Visible bowel gas pattern is within normal limits. IMPRESSION: 1. Pulmonary interstitial edema. Electronically signed by: Helayne Hurst MD 08/29/2024 05:26 AM EST RP Workstation: [id number]      Medications:      amLODipine   5 mg Oral Daily   aspirin  EC  81 mg Oral Daily   atorvastatin   10 mg Oral Daily   Chlorhexidine  Gluconate Cloth  6 each Topical Q0600   clopidogrel   75 mg Oral Daily   [START ON 08/30/2024] gabapentin   200 mg Oral Q T,Th,Sat-1800   heparin   5,000 Units Subcutaneous Q12H   polyethylene glycol  17 g Oral Daily   sevelamer  carbonate  800 mg Oral TID WC   lactulose   Assessment/ Plan:  Mario [name] is a 55 y.o.  male with past medical history including hypertension, neuropathy, chronic liver cirrhosis, portal hypertension, and end-stage renal disease on hemodialysis. Patient seen in ED for abdominal pain, fever, and nausea. He has been admitted under observation for Hyperkalemia [E87.5] CHF (congestive heart  failure) (HCC) [I50.9] Acute pulmonary edema (HCC) [J81.0] Other ascites [R18.8]  [facility]/TTS/Lt AVF  Hyperkalemia with end stage renal disease on hemodialysis.  Last treatment on Thursday.  Missed Saturday due to inclement weather.  Potassium 6.9 on ED arrival.  Will provide dialysis today on 1K bath for management.  Next treatment scheduled for Tuesday.  Will defer discharge plan to primary team.  2.  Acute pulmonary edema, seen on chest x-ray.  Missed last session of dialysis.  Will perform dialysis with UF.  Will assess after treatment.  3. Anemia of chronic kidney disease Lab Results  Component Value Date   HGB 11.6 (L) 08/29/2024    Hgb 11.6, within desired range no indication for dialysis.  4. Secondary Hyperparathyroidism: with outpatient labs: None  Lab Results  Component Value Date   PTH 266 (H) 08/14/2024   PTH Comment 08/14/2024   CALCIUM  9.1 08/29/2024   PHOS 4.7 (H) 08/29/2024    Will continue to monitor bone minerals during this admission.   LOS: 0 Mario [name] 1/26/202610:09 AM   "

## 2024-08-29 NOTE — ED Notes (Signed)
 Repeat troponin sent to lab at this time.

## 2024-08-29 NOTE — ED Notes (Signed)
 Fall bundle in place, bed alarm activated

## 2024-08-29 NOTE — ED Notes (Signed)
 To CT

## 2024-08-29 NOTE — ED Notes (Addendum)
 Pt states that he does not make any urine at baseline. MD SHAUNNA Mana messaged to inform.

## 2024-08-29 NOTE — ED Triage Notes (Signed)
 Patient presents to the ED via EMS from home for complaints of abdominal pain, fever, and nausea. Patient is a dialysis patient who was last dialysized on Thursday (08/25/24). GCS of 15, VSS.

## 2024-08-29 NOTE — ED Provider Notes (Signed)
 "  [facility] Provider Note    Event Date/Time   First MD Initiated Contact with Patient 08/29/24 0502     (approximate)   History   Abdominal Pain   HPI  Mario [name] is a 54 y.o. male with history of CHF, end-stage renal disease on hemodialysis Tuesday, Thursday and Saturday for the past 14 years, hypertension, cirrhosis who presents to the emergency department with complaints of shortness of breath.  He states that he was last dialyzed on Thursday while he was here in the hospital for left lower extremity claudication and foot infection.  Did not get dialyzed on Saturday due to dialysis center being closed because of inclement weather.  He denies having any chest pain but states his chest feels weak.  No cough but reports subjective fever.  He also reports abdominal discomfort, abdominal distention.  States he has not had a normal bowel movement.  He is unable to tell me when his last normal bowel movement was.  States he was able to pass a small amount of stool yesterday but none since it is no longer passing gas.  Reports he has had nausea and vomiting.  Denies previous abdominal surgery.  Denies prior history of bowel obstruction.  Patient did undergo paracentesis removing 4.4 L on 08/15/2024.  Patient also has a wound to the left dorsal hand and left lateral foot.  Patient has been referred to local hand surgeon for concerns for osteomyelitis of the third metacarpal.  He also has osteomyelitis of the fifth metatarsal.  Patient was discharged from the hospital for the same on 08/18/2024.  He has not been on antibiotics.  Patient underwent angiogram on 08/12/2024 by Dr. Marea and underwent balloon angioplasty of the left TP trunk, proximal posterior tibial artery and infra malleoli or portion of the anterior tibial artery/dorsalis pedis artery.   History provided by patient using Spanish interpreter, EMS.    Past Medical History:  Diagnosis Date    Abdominal pain 08/28/2016   Acute hyperkalemia 10/28/2016   Chest pain 04/11/2016   Chronic combined systolic and diastolic CHF (congestive heart failure) (HCC)    Cirrhosis (HCC)    Dialysis patient    Edentulous    ESRD (end stage renal disease) on dialysis (HCC) 01/25/2015   Fluid overload 04/12/2016   HTN (hypertension) 10/13/2016   Hyperkalemia 11/27/2016   Hypertension    Mitral regurgitation    Pulmonary edema 07/03/2016   Renal disorder    Renal insufficiency    Scrotal infection 01/25/2015    Past Surgical History:  Procedure Laterality Date   ANTERIOR CERVICAL CORPECTOMY N/A 04/05/2019   Procedure: ANTERIOR CERVICAL CORPECTOMY Cervical six and Cervical seven with strut graft fusion from Cervical five-Thoracic one   ;  Surgeon: Louis Shove, MD;  Location: [facility] OR;  Service: Neurosurgery;  Laterality: N/A;   AV FISTULA PLACEMENT     LOWER EXTREMITY ANGIOGRAPHY Left 08/12/2024   Procedure: Lower Extremity Angiography;  Surgeon: Marea Selinda RAMAN, MD;  Location: [facility] INVASIVE CV LAB;  Service: Cardiovascular;  Laterality: Left;   POSTERIOR CERVICAL FUSION/FORAMINOTOMY N/A 04/05/2019   Procedure: Posterior cervical fusion utilizing segmental instrumentation from Cervical five-Thoracic one ;  Surgeon: Louis Shove, MD;  Location: [facility] OR;  Service: Neurosurgery;  Laterality: N/A;    MEDICATIONS:  Prior to Admission medications  Medication Sig Start Date End Date Taking? Authorizing Provider  acetaminophen  (TYLENOL ) 325 MG tablet Take 2 tablets (650 mg total) by mouth every 6 (six) hours  as needed for mild pain (pain score 1-3) or fever (or Fever >/= 101). 08/18/24   Dorinda Drue DASEN, MD  amLODipine  (NORVASC ) 5 MG tablet Take 1 tablet (5 mg total) by mouth daily. 08/18/24 09/17/24  Dorinda Drue DASEN, MD  ascorbic acid  (VITAMIN C ) 500 MG tablet Take 1 tablet (500 mg total) by mouth 2 (two) times daily. 08/18/24   Dorinda Drue DASEN, MD  aspirin  EC 81 MG tablet Take 1 tablet (81 mg total) by mouth daily.  Swallow whole. 08/18/24   Dorinda Drue DASEN, MD  atorvastatin  (LIPITOR) 10 MG tablet Take 1 tablet (10 mg total) by mouth daily. 08/18/24   Dorinda Drue DASEN, MD  clopidogrel  (PLAVIX ) 75 MG tablet Take 1 tablet (75 mg total) by mouth daily. 08/18/24   Dorinda Drue DASEN, MD  gabapentin  (NEURONTIN ) 100 MG capsule Take 2 capsules (200 mg total) by mouth every Tuesday, Thursday, and Saturday at 6 PM. 08/18/24   Dorinda Drue DASEN, MD  multivitamin (RENA-VIT) TABS tablet Take 1 tablet by mouth at bedtime. 08/18/24   Dorinda Drue DASEN, MD  polyethylene glycol (MIRALAX  / GLYCOLAX ) 17 g packet Take 17 g by mouth daily. 08/18/24   Dorinda Drue DASEN, MD  prednisoLONE  acetate (PRED FORTE ) 1 % ophthalmic suspension Place 1-2 drops into the right eye every 4 (four) hours as needed. Patient not taking: Reported on 08/11/2024 01/16/22   [provider]  sevelamer  carbonate (RENVELA ) 800 MG tablet Take 1 tablet (800 mg total) by mouth 3 (three) times daily with meals. 08/18/24   Dorinda Drue DASEN, MD  Vitamin D , Ergocalciferol , (DRISDOL ) 1.25 MG (50000 UNIT) CAPS capsule Take 1 capsule (50,000 Units total) by mouth every 7 (seven) days. 08/23/24   Dorinda Drue DASEN, MD    Physical Exam   Triage Vital Signs: ED Triage Vitals  Encounter Vitals Group     BP 08/29/24 0502 (!) 146/76     Girls Systolic BP Percentile --      Girls Diastolic BP Percentile --      Boys Systolic BP Percentile --      Boys Diastolic BP Percentile --      Pulse Rate 08/29/24 0502 72     Resp 08/29/24 0502 18     Temp 08/29/24 0502 98.1 F (36.7 C)     Temp Source 08/29/24 0502 Oral     SpO2 08/29/24 0501 99 %     Weight 08/29/24 0503 168 lb 5 oz (76.3 kg)     Height --      Head Circumference --      Peak Flow --      Pain Score --      Pain Loc --      Pain Education --      Exclude from Growth Chart --     Most recent vital signs: Vitals:   08/29/24 0502 08/29/24 0533  BP: (!) 146/76   Pulse: 72   Resp: 18   Temp: 98.1 F (36.7 C)   SpO2:  99% 99%    CONSTITUTIONAL: Alert, responds appropriately to questions.  Chronically ill-appearing, appears much older than stated age, appears uncomfortable HEAD: Normocephalic, atraumatic EYES: Conjunctivae clear, pupils appear equal, sclera nonicteric ENT: normal nose; moist mucous membranes NECK: Supple, normal ROM CARD: RRR; S1 and S2 appreciated RESP: Patient has bibasilar rales.  No rhonchi or wheezing.  Tachypnea but no hypoxia.  Speaking full sentences. ABD/GI: Distended without tympany.  Diffusely tender without guarding or rebound. BACK:  The back appears normal EXT: Normal ROM in all joints; no deformity noted, superficial ulcerated lesion noted to the dorsal left hand without increased redness or warmth.  Fistula has normal thrill and bruit in the left upper extremity.  2+ left radial pulse.  I am able to Doppler biphasic DP and PT pulses in bilateral lower extremities.  Extremities feel warm and well-perfused.  He has a 1.5 x 1.5 cm open wound to the lateral left foot at the fifth metatarsal with minimal purulent drainage but no surrounding redness, increased warmth, crepitus. SKIN: Normal color for age and race; warm; no rash on exposed skin NEURO: Moves all extremities equally, normal speech PSYCH: The patient's mood and manner are appropriate.     LEFT foot   Patient gave verbal permission to utilize photo for medical documentation only. The image was not stored on any personal device.   ED Results / Procedures / Treatments   LABS: (all labs ordered are listed, but only abnormal results are displayed) Labs Reviewed  CBC WITH DIFFERENTIAL/PLATELET - Abnormal; Notable for the following components:      Result Value   RBC 3.56 (*)    Hemoglobin 11.6 (*)    HCT 37.5 (*)    MCV 105.3 (*)    Platelets 113 (*)    All other components within normal limits  COMPREHENSIVE METABOLIC PANEL WITH GFR - Abnormal; Notable for the following components:   Potassium 6.9 (*)     Chloride 97 (*)    BUN 46 (*)    Creatinine, Ser 6.16 (*)    AST 57 (*)    Alkaline Phosphatase 211 (*)    GFR, Estimated 10 (*)    All other components within normal limits  LIPASE, BLOOD - Abnormal; Notable for the following components:   Lipase 73 (*)    All other components within normal limits  MAGNESIUM - Abnormal; Notable for the following components:   Magnesium 3.0 (*)    All other components within normal limits  PHOSPHORUS - Abnormal; Notable for the following components:   Phosphorus 4.7 (*)    All other components within normal limits  PRO BRAIN NATRIURETIC PEPTIDE - Abnormal; Notable for the following components:   Pro Brain Natriuretic Peptide 27,545.0 (*)    All other components within normal limits  TROPONIN T, HIGH SENSITIVITY - Abnormal; Notable for the following components:   Troponin T High Sensitivity 98 (*)    All other components within normal limits  PROTIME-INR     EKG:  EKG Interpretation Date/Time:  Monday August 29 2024 05:02:17 EST Ventricular Rate:  75 PR Interval:  198 QRS Duration:  166 QT Interval:  433 QTC Calculation: 484 R Axis:   -68  Text Interpretation: Sinus rhythm Right bundle branch block LVH with IVCD and secondary repol abnrm Borderline prolonged QT interval Baseline wander in lead(s) V3 Confirmed by Neomi Neptune [phone]) on 08/29/2024 5:05:48 AM         RADIOLOGY: My personal review and interpretation of imaging: Chest x-ray shows pulmonary edema.  CT abdomen pelvis shows ascites.  I have personally reviewed all radiology reports.   CT ABDOMEN PELVIS W CONTRAST Result Date: 08/29/2024 EXAM: CT ABDOMEN AND PELVIS WITH CONTRAST 08/29/2024 05:54:01 AM TECHNIQUE: CT of the abdomen and pelvis was performed with the administration of 100 mL of iohexol  (OMNIPAQUE ) 300 MG/ML solution. Multiplanar reformatted images are provided for review. Automated exposure control, iterative reconstruction, and/or weight-based adjustment of the  mA/kV was utilized to  reduce the radiation dose to as low as reasonably achievable. COMPARISON: 08/11/2024 CLINICAL HISTORY: Abdominal pain, acute, nonlocalized; Bowel obstruction suspected. FINDINGS: LOWER CHEST: Unchanged chronic left pleural thickening and calcification. Mild interstitial edema identified within the visualized lung bases. LIVER: Advanced changes of liver cirrhosis. GALLBLADDER AND BILE DUCTS: Partially decompressed gallbladder. No biliary ductal dilatation. SPLEEN: The spleen is enlarged, measuring 16.3 cm. No focal splenic lesion identified. Perisplenic varicosities are identified. PANCREAS: No acute abnormality. ADRENAL GLANDS: Unchanged right adrenal nodule measuring 1.3 cm most consistent with a benign adenoma. No follow-up imaging recommended. KIDNEYS, URETERS AND BLADDER: End-stage kidneys with multiple cysts are again noted, unchanged in appearance from the previous exam. Decompressed bladder containing 5 mm stone and circumferential wall thickening, image 75/2. No stones in the ureters. No hydronephrosis. No perinephric or periureteral stranding. GI AND BOWEL: Stomach appears normal. No pathologic dilatation of the bowel loops to suggest bowel obstruction. The appendix is visualized and appears normal. Large hemorrhoid along the right lateral aspect of the rectum noted. PERITONEUM AND RETROPERITONEUM: Large volume of abdominal and pelvic ascites. No free air. No focal fluid collections. VASCULATURE: Aorta is normal in caliber. Aortic atherosclerosis. Extensive vascular calcifications identified. Large esophageal and retrocrural varicosities are identified. Perisplenic varicosities are also identified. Large pelvic varicosities including large hemorrhoid along the right lateral aspect of the rectum noted. LYMPH NODES: No lymphadenopathy. REPRODUCTIVE ORGANS: Prostate gland is unremarkable. BONES AND SOFT TISSUES: No acute osseous findings. Bony stigmata of renal osteodystrophy. No focal  soft tissue abnormality. IMPRESSION: 1. No evidence of bowel obstruction. 2. Large volume abdominal and pelvic ascites, most likely related to portal hypertension from cirrhosis; consider diagnostic paracentesis if not previously established. 3. Cirrhosis with portal hypertension manifested by extensive esophageal/retrocrural/perisplenic/pelvic varices and splenomegaly. 4. Decompressed bladder with 5 mm calculus and circumferential wall thickening, which can be seen with cystitis or chronic outlet obstruction; urologic evaluation as indicated. Electronically signed by: Waddell Calk MD 08/29/2024 06:05 AM EST RP Workstation: [id number]   DG Chest Portable 1 View Result Date: 08/29/2024 EXAM: 1 VIEW XRAY OF THE CHEST 08/29/2024 05:13:00 AM COMPARISON: Chest radiographs 10/13/2023 and earlier. CLINICAL HISTORY: 54 year old male with shortness of breath after missed dialysis. FINDINGS: LUNGS AND PLEURA: Diffuse interstitial prominence with perihilar airspace opacities. No focal pulmonary opacity. No pleural effusion. No pneumothorax. HEART AND MEDIASTINUM: Cardiomegaly. Calcified aorta. BONES AND SOFT TISSUES: Cervical spine fusion hardware. No acute osseous abnormality. ABDOMEN: Visible bowel gas pattern is within normal limits. IMPRESSION: 1. Pulmonary interstitial edema. Electronically signed by: Helayne Hurst MD 08/29/2024 05:26 AM EST RP Workstation: [id number]     PROCEDURES:  Critical Care performed: Yes, see critical care procedure note(s)   CRITICAL CARE Performed by: Josette Sink   Total critical care time: 30 minutes  Critical care time was exclusive of separately billable procedures and treating other patients.  Critical care was necessary to treat or prevent imminent or life-threatening deterioration.  Critical care was time spent personally by me on the following activities: development of treatment plan with patient and/or surrogate as well as nursing, discussions with consultants,  evaluation of patient's response to treatment, examination of patient, obtaining history from patient or surrogate, ordering and performing treatments and interventions, ordering and review of laboratory studies, ordering and review of radiographic studies, pulse oximetry and re-evaluation of patient's condition.   SABRA1-3 Lead EKG Interpretation  Performed by: Fate Galanti, Josette SAILOR, DO Authorized by: Mylen Mangan, Josette SAILOR, DO     Interpretation: normal     ECG rate:  72   ECG rate assessment: normal     Rhythm: sinus rhythm     Ectopy: none     Conduction: normal       IMPRESSION / MDM / ASSESSMENT AND PLAN / ED COURSE  I reviewed the triage vital signs and the nursing notes.    Patient here with multiple complaints.  Complaining of shortness of breath due to missed dialysis.  Also reports subjective fevers.  Also having abdominal pain, distention, not passing stool or gas, vomiting.  The patient is on the cardiac monitor to evaluate for evidence of arrhythmia and/or significant heart rate changes.   DIFFERENTIAL DIAGNOSIS (includes but not limited to):   Volume overload due to missed dialysis, CHF exacerbation, PE given recent hospitalization, pneumonia, viral URI, pneumothorax, ACS  Bowel obstruction, ileus, constipation, ascites due to cirrhosis, less likely SBP, diverticulitis, colitis, appendicitis   Patient's presentation is most consistent with acute presentation with potential threat to life or bodily function.   PLAN: Will obtain labs, chest x-ray, CT of the abdomen pelvis.  Patient no longer makes urine.  Will give pain and nausea medicine.  Sats 98% on room air but does have increased work of breathing and is requesting oxygen.  Does not need BiPAP currently but will closely monitor.  I suspect fentanyl  will help with his feelings of air hunger as well.   MEDICATIONS GIVEN IN ED: Medications  sodium zirconium cyclosilicate  (LOKELMA ) packet 10 g (has no administration in time  range)  calcium  gluconate 1 g/ 50 mL sodium chloride  IVPB (has no administration in time range)  albuterol  (PROVENTIL ) (2.5 MG/3ML) 0.083% nebulizer solution 10 mg (has no administration in time range)  morphine  (PF) 2 MG/ML injection 2 mg (has no administration in time range)  fentaNYL  (SUBLIMAZE ) injection 50 mcg (50 mcg Intravenous Given 08/29/24 0511)  ondansetron  (ZOFRAN ) injection 4 mg (4 mg Intravenous Given 08/29/24 0510)  iohexol  (OMNIPAQUE ) 300 MG/ML solution 100 mL (100 mLs Intravenous Contrast Given 08/29/24 0545)     ED COURSE: Chest x-ray reviewed and interpreted by myself and the radiologist and shows diffuse pulmonary edema.  Not a candidate for IV diuresis given patient no longer urinates.  He will need dialysis this morning.  6:11 AM  Pt's potassium levels come back at 6.9.  No EKG changes.  Will give albuterol , Lokelma , calcium  to temporize.  I have reached out to nephrology and spoke with Dr. Marcelino.  They will get him on the schedule for dialysis this morning.  Normal bicarb.  Minimally elevated AST and alkaline phosphatase.  Troponin minimally elevated which may be from demand ischemia versus decreased clearance from chronic kidney disease.  CT abdomen pelvis shows diffuse ascites.  No bowel obstruction.  He does have mild tenderness diffusely throughout the abdomen but is not peritoneal and has no fever.  I have low suspicion that this is SBP.  He will likely need nonemergent paracentesis while in the hospital.  Will discuss with hospitalist for admission.   CONSULTS:  Consulted and discussed patient's case with hospitalist, Dr. Lawence.  I have recommended admission and consulting physician agrees and will place admission orders.  Patient (and family if present) agree with this plan.   I reviewed all nursing notes, vitals, pertinent previous records.  All labs, EKGs, imaging ordered have been independently reviewed and interpreted by myself.    OUTSIDE RECORDS  REVIEWED: Reviewed recent admission notes.       FINAL CLINICAL IMPRESSION(S) / ED DIAGNOSES   Final  diagnoses:  Acute pulmonary edema (HCC)  Other ascites  Hyperkalemia     Rx / DC Orders   ED Discharge Orders     None        Note:  This document was prepared using Dragon voice recognition software and may include unintentional dictation errors.   Claris Guymon, Josette SAILOR, DO 08/29/24 [phone]  "

## 2024-08-29 NOTE — H&P (Signed)
 " History and Physical    Mario [name] FMW:[id number] DOB: September 09, 1970 DOA: 08/29/2024  PCP: Lateef, Munsoor, MD (Confirm with patient/family/NH records and if not entered, this has to be entered at [facility] point of entry) Patient coming from: Home  I have personally briefly reviewed patient's old medical records in [facility]  Chief Complaint: SOB, chest pain, belly hurts, left foot hurts  HPI: Mario [name] is a 54 y.o. male with medical history significant of ESRD on HD TTS, liver cirrhosis, HTN, chronic HFpEF, HLD, chronic left third finger infection, mild nonobstructive PVD, presented with multiple complaints including chest pain, increasing shortness of breath, abdominal pain and left foot pain.  Due to snowstorm on weekend, patient missed his Saturday dialysis.  Yesterday evening he started to feel increasing shortness of breath and could not lie flat on the bed, with sharp-like chest pain worsening with deep breath.  He also complained about  episodes of cramping-like abdominal pain but denies any nauseous vomiting or diarrhea.  In addition he also complained about worsening of left foot pain acute on chronic. ED Course: Afebrile, nontachycardic blood pressure 150/80 O2 saturation 99% on room air.  Chest x-ray showed cardiomegaly and pulmonary congestion, CT abdomen pelvis showed large ascites.  Blood work showed WBC 5.5 hemoglobin 11.6 BUN 46 creatinine 10.1 K6.9 bicarb 25.  EKG showed tented T waves on multiple leads.  Patient was given hyperkalemia cocktail including calcium  gluconate, albuterol  treatment, Lokelma .  Nephrology consulted.  Review of Systems: As per HPI otherwise 14 point review of systems negative.    Past Medical History:  Diagnosis Date   Abdominal pain 08/28/2016   Acute hyperkalemia 10/28/2016   Chest pain 04/11/2016   Chronic combined systolic and diastolic CHF (congestive heart failure) (HCC)    Cirrhosis (HCC)    Dialysis patient     Edentulous    ESRD (end stage renal disease) on dialysis (HCC) 01/25/2015   Fluid overload 04/12/2016   HTN (hypertension) 10/13/2016   Hyperkalemia 11/27/2016   Hypertension    Mitral regurgitation    Pulmonary edema 07/03/2016   Renal disorder    Renal insufficiency    Scrotal infection 01/25/2015    Past Surgical History:  Procedure Laterality Date   ANTERIOR CERVICAL CORPECTOMY N/A 04/05/2019   Procedure: ANTERIOR CERVICAL CORPECTOMY Cervical six and Cervical seven with strut graft fusion from Cervical five-Thoracic one   ;  Surgeon: Louis Shove, MD;  Location: [facility] OR;  Service: Neurosurgery;  Laterality: N/A;   AV FISTULA PLACEMENT     LOWER EXTREMITY ANGIOGRAPHY Left 08/12/2024   Procedure: Lower Extremity Angiography;  Surgeon: Marea Selinda RAMAN, MD;  Location: [facility] INVASIVE CV LAB;  Service: Cardiovascular;  Laterality: Left;   POSTERIOR CERVICAL FUSION/FORAMINOTOMY N/A 04/05/2019   Procedure: Posterior cervical fusion utilizing segmental instrumentation from Cervical five-Thoracic one ;  Surgeon: Louis Shove, MD;  Location: [facility] OR;  Service: Neurosurgery;  Laterality: N/A;     reports that he has been smoking cigarettes. He has a 6.3 pack-year smoking history. He has never used smokeless tobacco. He reports that he does not drink alcohol and does not use drugs.  Allergies[1]  Family History  Problem Relation Age of Onset   Kidney failure Father    Diabetes Mother      Prior to Admission medications  Medication Sig Start Date End Date Taking? Authorizing Provider  acetaminophen  (TYLENOL ) 325 MG tablet Take 2 tablets (650 mg total) by mouth every 6 (six) hours as needed for  mild pain (pain score 1-3) or fever (or Fever >/= 101). 08/18/24  Yes Dorinda Drue DASEN, MD  amLODipine  (NORVASC ) 5 MG tablet Take 1 tablet (5 mg total) by mouth daily. 08/18/24 09/17/24 Yes Djan, Drue DASEN, MD  ascorbic acid  (VITAMIN C ) 500 MG tablet Take 1 tablet (500 mg total) by mouth 2 (two) times daily. 08/18/24   Yes Dorinda Drue DASEN, MD  aspirin  EC 81 MG tablet Take 1 tablet (81 mg total) by mouth daily. Swallow whole. 08/18/24  Yes Dorinda Drue DASEN, MD  atorvastatin  (LIPITOR) 10 MG tablet Take 1 tablet (10 mg total) by mouth daily. 08/18/24  Yes Dorinda Drue DASEN, MD  clopidogrel  (PLAVIX ) 75 MG tablet Take 1 tablet (75 mg total) by mouth daily. 08/18/24  Yes Dorinda Drue DASEN, MD  gabapentin  (NEURONTIN ) 100 MG capsule Take 2 capsules (200 mg total) by mouth every Tuesday, Thursday, and Saturday at 6 PM. 08/18/24  Yes Djan, Drue DASEN, MD  multivitamin (RENA-VIT) TABS tablet Take 1 tablet by mouth at bedtime. 08/18/24  Yes Dorinda Drue DASEN, MD  polyethylene glycol (MIRALAX  / GLYCOLAX ) 17 g packet Take 17 g by mouth daily. 08/18/24  Yes Dorinda Drue DASEN, MD  sevelamer  carbonate (RENVELA ) 800 MG tablet Take 1 tablet (800 mg total) by mouth 3 (three) times daily with meals. 08/18/24  Yes Djan, Drue DASEN, MD  Vitamin D , Ergocalciferol , (DRISDOL ) 1.25 MG (50000 UNIT) CAPS capsule Take 1 capsule (50,000 Units total) by mouth every 7 (seven) days. 08/23/24  Yes Dorinda Drue DASEN, MD  prednisoLONE  acetate (PRED FORTE ) 1 % ophthalmic suspension Place 1-2 drops into the right eye every 4 (four) hours as needed. Patient not taking: Reported on 08/11/2024 01/16/22   [provider]    Physical Exam: Vitals:   08/29/24 0630 08/29/24 0634 08/29/24 0700 08/29/24 0733  BP: (!) 147/82  (!) 146/79   Pulse: 71 69 69 73  Resp: 16 18 16 11   Temp:   98.1 F (36.7 C)   TempSrc:   Oral   SpO2: 96% 99% 96% 90%  Weight:        Constitutional: NAD, calm, comfortable Vitals:   08/29/24 0630 08/29/24 0634 08/29/24 0700 08/29/24 0733  BP: (!) 147/82  (!) 146/79   Pulse: 71 69 69 73  Resp: 16 18 16 11   Temp:   98.1 F (36.7 C)   TempSrc:   Oral   SpO2: 96% 99% 96% 90%  Weight:       Eyes: PERRL, lids and conjunctivae normal ENMT: Mucous membranes are moist. Posterior pharynx clear of any exudate or lesions.Normal dentition.  Neck:  normal, supple, no masses, no thyromegaly Respiratory: clear to auscultation bilaterally, no wheezing, fine crackles on bilateral lower fields, increasing respiratory effort. No accessory muscle use.  Cardiovascular: Regular rate and rhythm, no murmurs / rubs / gallops. No extremity edema. 2+ pedal pulses. No carotid bruits.  Abdomen: Mild tenderness on periumbilical area, no rebound or guarding, positive ascites sign, no masses palpated. No hepatosplenomegaly. Bowel sounds positive.  Musculoskeletal: no clubbing / cyanosis. No joint deformity upper and lower extremities. Good ROM, no contractures. Normal muscle tone.  Skin: no rashes, lesions, ulcers. No induration Neurologic: CN 2-12 grossly intact. Sensation intact, DTR normal. Strength 5/5 in all 4.  Psychiatric: Normal judgment and insight. Alert and oriented x 3. Normal mood.     Labs on Admission: I have personally reviewed following labs and imaging studies  CBC: Recent Labs  Lab 08/29/24 0516  WBC 5.5  NEUTROABS 3.7  HGB 11.6*  HCT 37.5*  MCV 105.3*  PLT 113*   Basic Metabolic Panel: Recent Labs  Lab 08/29/24 0516  NA 135  K 6.9*  CL 97*  CO2 25  GLUCOSE 88  BUN 46*  CREATININE 6.16*  CALCIUM  9.1  MG 3.0*  PHOS 4.7*   GFR: Estimated Creatinine Clearance: 12.5 mL/min (A) (by C-G formula based on SCr of 6.16 mg/dL (H)). Liver Function Tests: Recent Labs  Lab 08/29/24 0516  AST 57*  ALT 23  ALKPHOS 211*  BILITOT 0.7  PROT 7.3  ALBUMIN 3.8   Recent Labs  Lab 08/29/24 0516  LIPASE 73*   No results for input(s): AMMONIA in the last 168 hours. Coagulation Profile: Recent Labs  Lab 08/29/24 0528  INR 1.2   Cardiac Enzymes: No results for input(s): CKTOTAL, CKMB, CKMBINDEX, TROPONINI in the last 168 hours. BNP (last 3 results) Recent Labs    08/29/24 0516  PROBNP 27,545.0*   HbA1C: No results for input(s): HGBA1C in the last 72 hours. CBG: No results for input(s): GLUCAP in the  last 168 hours. Lipid Profile: No results for input(s): CHOL, HDL, LDLCALC, TRIG, CHOLHDL, LDLDIRECT in the last 72 hours. Thyroid Function Tests: No results for input(s): TSH, T4TOTAL, FREET4, T3FREE, THYROIDAB in the last 72 hours. Anemia Panel: No results for input(s): VITAMINB12, FOLATE, FERRITIN, TIBC, IRON , RETICCTPCT in the last 72 hours. Urine analysis: No results found for: COLORURINE, APPEARANCEUR, LABSPEC, PHURINE, GLUCOSEU, HGBUR, BILIRUBINUR, KETONESUR, PROTEINUR, UROBILINOGEN, NITRITE, LEUKOCYTESUR  Radiological Exams on Admission: CT ABDOMEN PELVIS W CONTRAST Result Date: 08/29/2024 EXAM: CT ABDOMEN AND PELVIS WITH CONTRAST 08/29/2024 05:54:01 AM TECHNIQUE: CT of the abdomen and pelvis was performed with the administration of 100 mL of iohexol  (OMNIPAQUE ) 300 MG/ML solution. Multiplanar reformatted images are provided for review. Automated exposure control, iterative reconstruction, and/or weight-based adjustment of the mA/kV was utilized to reduce the radiation dose to as low as reasonably achievable. COMPARISON: 08/11/2024 CLINICAL HISTORY: Abdominal pain, acute, nonlocalized; Bowel obstruction suspected. FINDINGS: LOWER CHEST: Unchanged chronic left pleural thickening and calcification. Mild interstitial edema identified within the visualized lung bases. LIVER: Advanced changes of liver cirrhosis. GALLBLADDER AND BILE DUCTS: Partially decompressed gallbladder. No biliary ductal dilatation. SPLEEN: The spleen is enlarged, measuring 16.3 cm. No focal splenic lesion identified. Perisplenic varicosities are identified. PANCREAS: No acute abnormality. ADRENAL GLANDS: Unchanged right adrenal nodule measuring 1.3 cm most consistent with a benign adenoma. No follow-up imaging recommended. KIDNEYS, URETERS AND BLADDER: End-stage kidneys with multiple cysts are again noted, unchanged in appearance from the previous exam. Decompressed  bladder containing 5 mm stone and circumferential wall thickening, image 75/2. No stones in the ureters. No hydronephrosis. No perinephric or periureteral stranding. GI AND BOWEL: Stomach appears normal. No pathologic dilatation of the bowel loops to suggest bowel obstruction. The appendix is visualized and appears normal. Large hemorrhoid along the right lateral aspect of the rectum noted. PERITONEUM AND RETROPERITONEUM: Large volume of abdominal and pelvic ascites. No free air. No focal fluid collections. VASCULATURE: Aorta is normal in caliber. Aortic atherosclerosis. Extensive vascular calcifications identified. Large esophageal and retrocrural varicosities are identified. Perisplenic varicosities are also identified. Large pelvic varicosities including large hemorrhoid along the right lateral aspect of the rectum noted. LYMPH NODES: No lymphadenopathy. REPRODUCTIVE ORGANS: Prostate gland is unremarkable. BONES AND SOFT TISSUES: No acute osseous findings. Bony stigmata of renal osteodystrophy. No focal soft tissue abnormality. IMPRESSION: 1. No evidence of bowel obstruction. 2. Large volume abdominal and  pelvic ascites, most likely related to portal hypertension from cirrhosis; consider diagnostic paracentesis if not previously established. 3. Cirrhosis with portal hypertension manifested by extensive esophageal/retrocrural/perisplenic/pelvic varices and splenomegaly. 4. Decompressed bladder with 5 mm calculus and circumferential wall thickening, which can be seen with cystitis or chronic outlet obstruction; urologic evaluation as indicated. Electronically signed by: Waddell Calk MD 08/29/2024 06:05 AM EST RP Workstation: [id number]   DG Chest Portable 1 View Result Date: 08/29/2024 EXAM: 1 VIEW XRAY OF THE CHEST 08/29/2024 05:13:00 AM COMPARISON: Chest radiographs 10/13/2023 and earlier. CLINICAL HISTORY: 54 year old male with shortness of breath after missed dialysis. FINDINGS: LUNGS AND PLEURA: Diffuse  interstitial prominence with perihilar airspace opacities. No focal pulmonary opacity. No pleural effusion. No pneumothorax. HEART AND MEDIASTINUM: Cardiomegaly. Calcified aorta. BONES AND SOFT TISSUES: Cervical spine fusion hardware. No acute osseous abnormality. ABDOMEN: Visible bowel gas pattern is within normal limits. IMPRESSION: 1. Pulmonary interstitial edema. Electronically signed by: Helayne Hurst MD 08/29/2024 05:26 AM EST RP Workstation: [id number]    EKG: Independently reviewed.  Sinus rhythm, chronic RBBB, tented T waves on multiple leads.  Assessment/Plan Principal Problem:   Acute pulmonary edema (HCC) Active Problems:   CHF (congestive heart failure) (HCC)  (please populate well all problems here in Problem List. (For example, if patient is on BP meds at home and you resume or decide to hold them, it is a problem that needs to be her. Same for CAD, COPD, HLD and so on)  Acute on chronic HFpEF with decompensation - Secondary to missed HD - Emergency HD today  Hyperkalemia - Secondary to missed HD - Received hyperkalemia cocktail - Nephrology consulted, patient is to have emergency HD today - Repeat BMP tomorrow  Acute decompensated cirrhosis Increasing ascites - IR paracentesis - Ascites culture, cell count and diffusion showed to rule out SBP.  Currently patient does not have any systemic inflammatory signs, no leukocytosis no tachycardia, no fever, we will hold off antibiotics. -Add as needed lactulose   Chronic left foot pain Nonobstructive PVD - Was worked up on last admission, PVD study with angiogram showed no obstructive but otherwise heavily calcified arteries in lower extremity. - Continue aspirin  Plavix  and statin - Continue gabapentin   HTN - Controlled, continue amlodipine    History of left hand ulcer - Previously found left hand ulcer located on the dorsal third MCP joint has closed.  DVT prophylaxis: Heparin  subcu Code Status: Full code Family  Communication: None at bedside Disposition Plan: Expect less than 2 midnight hospital stay Consults called: Nephrology Admission status: Telemetry observation   Cort ONEIDA Mana MD [facility] Pager [phone]  08/29/2024, 7:54 AM       [1]  Allergies Allergen Reactions   Betadine [Povidone Iodine ] Rash   Povidone-Iodine  Dermatitis   "

## 2024-08-29 NOTE — ED Notes (Signed)
 Placed patient on 3 LPM [place] for comfort per request.

## 2024-08-29 NOTE — Procedures (Signed)
 PROCEDURE SUMMARY:  Successful US  guided paracentesis from RLQ.  Yielded 5.8 L of clear yellow fluid.  No immediate complications.  Pt tolerated well.   Specimen sent for labs.  EBL < 2 mL  Warren JONELLE Dais, NP 08/29/2024 3:50 PM

## 2024-08-29 NOTE — ED Notes (Signed)
 Requested med from pharmacy

## 2024-08-30 DIAGNOSIS — E875 Hyperkalemia: Secondary | ICD-10-CM

## 2024-08-30 LAB — RENAL FUNCTION PANEL
Albumin: 3 g/dL — ABNORMAL LOW (ref 3.5–5.0)
Anion gap: 6 (ref 5–15)
BUN: 39 mg/dL — ABNORMAL HIGH (ref 6–20)
CO2: 31 mmol/L (ref 22–32)
Calcium: 8.7 mg/dL — ABNORMAL LOW (ref 8.9–10.3)
Chloride: 95 mmol/L — ABNORMAL LOW (ref 98–111)
Creatinine, Ser: 5.55 mg/dL — ABNORMAL HIGH (ref 0.61–1.24)
GFR, Estimated: 11 mL/min — ABNORMAL LOW
Glucose, Bld: 84 mg/dL (ref 70–99)
Phosphorus: 4.9 mg/dL — ABNORMAL HIGH (ref 2.5–4.6)
Potassium: 6.2 mmol/L — ABNORMAL HIGH (ref 3.5–5.1)
Sodium: 132 mmol/L — ABNORMAL LOW (ref 135–145)

## 2024-08-30 LAB — HEPATITIS B SURFACE ANTIBODY, QUANTITATIVE: Hep B S AB Quant (Post): 23552 m[IU]/mL

## 2024-08-30 LAB — PATHOLOGIST SMEAR REVIEW

## 2024-08-30 MED ORDER — ONDANSETRON HCL 4 MG/2ML IJ SOLN
4.0000 mg | Freq: Once | INTRAMUSCULAR | Status: AC
  Administered 2024-08-30: 4 mg via INTRAVENOUS
  Filled 2024-08-30: qty 2

## 2024-08-30 MED ORDER — SODIUM ZIRCONIUM CYCLOSILICATE 10 G PO PACK
10.0000 g | PACK | Freq: Once | ORAL | Status: AC
  Administered 2024-08-30: 10 g via ORAL
  Filled 2024-08-30: qty 1

## 2024-08-30 NOTE — Progress Notes (Signed)
 Pt receives outpt HD at [facility] on TTS at 6:30a. Navigator following to assist with any HD needs.  Suzen Satchel Dialysis Navigator [phone]

## 2024-08-30 NOTE — Progress Notes (Signed)
 " PROGRESS NOTE   HPI was taken from Dr. Laurita: Mario [name] is a 54 y.o. male with medical history significant of ESRD on HD TTS, liver cirrhosis, HTN, chronic HFpEF, HLD, chronic left third finger infection, mild nonobstructive PVD, presented with multiple complaints including chest pain, increasing shortness of breath, abdominal pain and left foot pain.   Due to snowstorm on weekend, patient missed his Saturday dialysis.  Yesterday evening he started to feel increasing shortness of breath and could not lie flat on the bed, with sharp-like chest pain worsening with deep breath.  He also complained about  episodes of cramping-like abdominal pain but denies any nauseous vomiting or diarrhea.  In addition he also complained about worsening of left foot pain acute on chronic. ED Course: Afebrile, nontachycardic blood pressure 150/80 O2 saturation 99% on room air.  Chest x-ray showed cardiomegaly and pulmonary congestion, CT abdomen pelvis showed large ascites.  Blood work showed WBC 5.5 hemoglobin 11.6 [name] 46 creatinine 10.1 K6.9 bicarb 25.  EKG showed tented T waves on multiple leads.   Patient was given hyperkalemia cocktail including calcium  gluconate, albuterol  treatment, Lokelma .  Nephrology consulted.     Mario [name]  FMW:[id number] DOB: [birth year] DOA: 08/29/2024 PCP: Lateef, Munsoor, MD    Assessment & Plan:   Principal Problem:   Acute pulmonary edema (HCC) Active Problems:   CHF (congestive heart failure) (HCC)  Assessment and Plan: Acute on chronic HFpEF: likely secondary to missing HD. Volume management w/ HD.   ESRD: on HD. Missed HD. Nephro following and recs apprec   Hyperkalemia: secondary to missed HD. Lokelma  ordered. Needs HD again. Nephro following and recs apprec   Acute decompensated cirrhosis: w/ ascites. S/p paracentesis on 08/29/24. Continue on lactulose .   Thrombocytopenia: likely secondary to cirrhosis. No need for a transfusion  currently  Macrocytic anemia: likely secondary to cirrhosis. No need for a transfusion currently    Chronic left foot pain: w/ hx of nonobstructive PVD. Was worked up on last admission, PVD study with angiogram showed no obstructive but otherwise heavily calcified arteries in lower extremity. Continue on aspirin , plavix , statin & gabapentin     HTN: continue on amlodipine     History of left hand ulcer: previously found left hand ulcer located on the dorsal third MCP joint has closed. Continue w/ supportive care       DVT prophylaxis: heparin   Code Status: full  Family Communication: Disposition Plan: depends on PT/OT recs  Level of care: Progressive  Status is: Inpatient Remains inpatient appropriate because: severity of illness, hyperkalemia    Consultants:  Nephro   Procedures:   Antimicrobials:    Subjective: Pt c/o pain   Objective: Vitals:   08/29/24 2049 08/30/24 0028 08/30/24 0355 08/30/24 0829  BP: (!) 142/69 118/61 110/66 116/66  Pulse: 79 85 81 75  Resp: 20 20 20    Temp: 98.4 F (36.9 C) 97.9 F (36.6 C) 98.8 F (37.1 C) 98.4 F (36.9 C)  TempSrc: Oral Oral Oral Oral  SpO2: 95% 100% 100% 100%  Weight:        Intake/Output Summary (Last 24 hours) at 08/30/2024 0930 Last data filed at 08/29/2024 1226 Gross per 24 hour  Intake --  Output 2500 ml  Net -2500 ml   Filed Weights   08/29/24 0830 08/29/24 0835 08/29/24 1226  Weight: 82.1 kg 82.1 kg 79.6 kg    Examination:  General exam: Appears calm and comfortable. Disheveled Respiratory system: decreased breath sounds b/l  Cardiovascular system:  S1 & S2 +. No rubs, gallops or clicks.  Gastrointestinal system: Abdomen is nondistended, soft and nontender. Normal bowel sounds heard. Central nervous system: Alert and oriented. Moves all extremities Psychiatry: Judgement and insight appears at baseline. Flat mood and affect    Data Reviewed: I have personally reviewed following labs and imaging  studies  CBC: Recent Labs  Lab 08/29/24 0516  WBC 5.5  NEUTROABS 3.7  HGB 11.6*  HCT 37.5*  MCV 105.3*  PLT 113*   Basic Metabolic Panel: Recent Labs  Lab 08/29/24 0516 08/30/24 0809  NA 135 132*  K 6.9* 6.2*  CL 97* 95*  CO2 25 31  GLUCOSE 88 84  [name] 46* 39*  CREATININE 6.16* 5.55*  CALCIUM  9.1 8.7*  MG 3.0*  --   PHOS 4.7* 4.9*   GFR: Estimated Creatinine Clearance: 14.2 mL/min (A) (by C-G formula based on SCr of 5.55 mg/dL (H)). Liver Function Tests: Recent Labs  Lab 08/29/24 0516 08/30/24 0809  AST 57*  --   ALT 23  --   ALKPHOS 211*  --   BILITOT 0.7  --   PROT 7.3  --   ALBUMIN 3.8 3.0*   Recent Labs  Lab 08/29/24 0516  LIPASE 73*   No results for input(s): AMMONIA in the last 168 hours. Coagulation Profile: Recent Labs  Lab 08/29/24 0528  INR 1.2   Cardiac Enzymes: No results for input(s): CKTOTAL, CKMB, CKMBINDEX, TROPONINI in the last 168 hours. BNP (last 3 results) Recent Labs    08/29/24 0516  PROBNP 27,545.0*   HbA1C: No results for input(s): HGBA1C in the last 72 hours. CBG: Recent Labs  Lab 08/29/24 1809  GLUCAP 110*   Lipid Profile: No results for input(s): CHOL, HDL, LDLCALC, TRIG, CHOLHDL, LDLDIRECT in the last 72 hours. Thyroid Function Tests: No results for input(s): TSH, T4TOTAL, FREET4, T3FREE, THYROIDAB in the last 72 hours. Anemia Panel: No results for input(s): VITAMINB12, FOLATE, FERRITIN, TIBC, IRON , RETICCTPCT in the last 72 hours. Sepsis Labs: No results for input(s): PROCALCITON, LATICACIDVEN in the last 168 hours.  Recent Results (from the past 240 hours)  Body fluid culture w Gram Stain     Status: None (Preliminary result)   Collection Time: 08/29/24  2:45 PM   Specimen: PATH Cytology Peritoneal fluid  Result Value Ref Range Status   Specimen Description   Final    PERITONEAL Performed at [facility], [address] [place], [place]  [address]    Special Requests   Final    NONE Performed at [facility], [address] [place], [place] [address]    Gram Stain NO WBC SEEN NO ORGANISMS SEEN   Final   Culture   Final    NO GROWTH < 12 HOURS Performed at [facility], [address] [place], [place] [address]    Report Status PENDING  Incomplete         Radiology Studies: US  Paracentesis Result Date: 08/29/2024 INDICATION: Patient with a history of heart failure, end-stage renal disease and cirrhosis with recurrent ascites. Interventional Radiology asked to perform a diagnostic and therapeutic paracentesis. EXAM: ULTRASOUND GUIDED PARACENTESIS MEDICATIONS: 1% lidocaine  10 ml COMPLICATIONS: None immediate. PROCEDURE: Informed written consent was obtained from the patient after a discussion of the risks, benefits and alternatives to treatment. A timeout was performed prior to the initiation of the procedure. Initial ultrasound scanning demonstrates a large amount of ascites within the right lower abdominal quadrant. The right lower abdomen  was prepped and draped in the usual sterile fashion. 1% lidocaine  was used for local anesthesia. Following this, a 19 gauge, 7-cm, Yueh catheter was introduced. An ultrasound image was saved for documentation purposes. The paracentesis was performed. The catheter was removed and a dressing was applied. The patient tolerated the procedure well without immediate post procedural complication. Patient received post-procedure intravenous albumin; see nursing notes for details. FINDINGS: A total of approximately 5.8 L of clear yellow fluid was removed. Samples were sent to the laboratory as requested by the clinical team. IMPRESSION: Successful ultrasound-guided paracentesis yielding 5.8 liters of peritoneal fluid. Procedure performed by: Warren Dais, NP Electronically Signed   By: Wilkie Lent M.D.   On: 08/29/2024 15:52   CT ABDOMEN PELVIS W CONTRAST Result Date:  08/29/2024 EXAM: CT ABDOMEN AND PELVIS WITH CONTRAST 08/29/2024 05:54:01 AM TECHNIQUE: CT of the abdomen and pelvis was performed with the administration of 100 mL of iohexol  (OMNIPAQUE ) 300 MG/ML solution. Multiplanar reformatted images are provided for review. Automated exposure control, iterative reconstruction, and/or weight-based adjustment of the mA/kV was utilized to reduce the radiation dose to as low as reasonably achievable. COMPARISON: 08/11/2024 CLINICAL HISTORY: Abdominal pain, acute, nonlocalized; Bowel obstruction suspected. FINDINGS: LOWER CHEST: Unchanged chronic left pleural thickening and calcification. Mild interstitial edema identified within the visualized lung bases. LIVER: Advanced changes of liver cirrhosis. GALLBLADDER AND BILE DUCTS: Partially decompressed gallbladder. No biliary ductal dilatation. SPLEEN: The spleen is enlarged, measuring 16.3 cm. No focal splenic lesion identified. Perisplenic varicosities are identified. PANCREAS: No acute abnormality. ADRENAL GLANDS: Unchanged right adrenal nodule measuring 1.3 cm most consistent with a benign adenoma. No follow-up imaging recommended. KIDNEYS, URETERS AND BLADDER: End-stage kidneys with multiple cysts are again noted, unchanged in appearance from the previous exam. Decompressed bladder containing 5 mm stone and circumferential wall thickening, image 75/2. No stones in the ureters. No hydronephrosis. No perinephric or periureteral stranding. GI AND BOWEL: Stomach appears normal. No pathologic dilatation of the bowel loops to suggest bowel obstruction. The appendix is visualized and appears normal. Large hemorrhoid along the right lateral aspect of the rectum noted. PERITONEUM AND RETROPERITONEUM: Large volume of abdominal and pelvic ascites. No free air. No focal fluid collections. VASCULATURE: Aorta is normal in caliber. Aortic atherosclerosis. Extensive vascular calcifications identified. Large esophageal and retrocrural varicosities  are identified. Perisplenic varicosities are also identified. Large pelvic varicosities including large hemorrhoid along the right lateral aspect of the rectum noted. LYMPH NODES: No lymphadenopathy. REPRODUCTIVE ORGANS: Prostate gland is unremarkable. BONES AND SOFT TISSUES: No acute osseous findings. Bony stigmata of renal osteodystrophy. No focal soft tissue abnormality. IMPRESSION: 1. No evidence of bowel obstruction. 2. Large volume abdominal and pelvic ascites, most likely related to portal hypertension from cirrhosis; consider diagnostic paracentesis if not previously established. 3. Cirrhosis with portal hypertension manifested by extensive esophageal/retrocrural/perisplenic/pelvic varices and splenomegaly. 4. Decompressed bladder with 5 mm calculus and circumferential wall thickening, which can be seen with cystitis or chronic outlet obstruction; urologic evaluation as indicated. Electronically signed by: Waddell Calk MD 08/29/2024 06:05 AM EST RP Workstation: [id number]   DG Chest Portable 1 View Result Date: 08/29/2024 EXAM: 1 VIEW XRAY OF THE CHEST 08/29/2024 05:13:00 AM COMPARISON: Chest radiographs 10/13/2023 and earlier. CLINICAL HISTORY: 55 year old male with shortness of breath after missed dialysis. FINDINGS: LUNGS AND PLEURA: Diffuse interstitial prominence with perihilar airspace opacities. No focal pulmonary opacity. No pleural effusion. No pneumothorax. HEART AND MEDIASTINUM: Cardiomegaly. Calcified aorta. BONES AND SOFT TISSUES: Cervical spine fusion hardware. No acute osseous abnormality.  ABDOMEN: Visible bowel gas pattern is within normal limits. IMPRESSION: 1. Pulmonary interstitial edema. Electronically signed by: Helayne Hurst MD 08/29/2024 05:26 AM EST RP Workstation: [id number]        Scheduled Meds:  amLODipine   5 mg Oral Daily   aspirin  EC  81 mg Oral Daily   atorvastatin   10 mg Oral Daily   Chlorhexidine  Gluconate Cloth  6 each Topical Q0600   clopidogrel   75 mg Oral  Daily   feeding supplement (NEPRO CARB STEADY)  237 mL Oral BID BM   gabapentin   200 mg Oral Q T,Th,Sat-1800   heparin   5,000 Units Subcutaneous Q12H   polyethylene glycol  17 g Oral Daily   sevelamer  carbonate  800 mg Oral TID WC   Continuous Infusions:   LOS: 0 days      Anthony CHRISTELLA Pouch, MD [facility] Pager [phone]  If 7PM-7AM, please contact night-coverage [email or web address] 08/30/2024, 9:30 AM   "

## 2024-08-30 NOTE — Progress Notes (Signed)
 " [facility]  ROUNDING NOTE   Subjective:   Mario [name] is a 54 y.o. male with past medical history including hypertension, neuropathy, chronic liver cirrhosis, portal hypertension, and end-stage renal disease on hemodialysis. Patient seen in ED for abdominal pain, fever, and nausea. He has been admitted under observation for Hyperkalemia [E87.5] CHF (congestive heart failure) (HCC) [I50.9] Acute pulmonary edema (HCC) [J81.0] Other ascites [R18.8]  Patient is known to our practice and receives outpatient dialysis treatments at [facility] on a TTS schedule, overseen by [facility] physicians.  Update: Patient laying in bed Continues to complain of left heel pain Room air   Objective:  Vital signs in last 24 hours:  Temp:  [97.9 F (36.6 C)-98.8 F (37.1 C)] 98.3 F (36.8 C) (01/27 1304) Pulse Rate:  [71-85] 80 (01/27 1304) Resp:  [20] 20 (01/27 0355) BP: (110-142)/(61-73) 139/73 (01/27 1304) SpO2:  [95 %-100 %] 100 % (01/27 1304)  Weight change: 5.754 kg Filed Weights   08/29/24 0830 08/29/24 0835 08/29/24 1226  Weight: 82.1 kg 82.1 kg 79.6 kg    Intake/Output: I/O last 3 completed shifts: In: -  Out: 2500 [Other:2500]   Intake/Output this shift:  No intake/output data recorded.  Physical Exam: General: NAD, resting comfortably  Head: Normocephalic  Eyes: Anicteric  Lungs:  normal effort, on room air  Heart: Regular rate  Abdomen:  Soft, nontender  Extremities:  No peripheral edema  Neurologic: Awake, alert  Skin: Warm,dry. Left heel wound  Access: Lt AVF    Basic Metabolic Panel: Recent Labs  Lab 08/29/24 0516 08/30/24 0809  NA 135 132*  K 6.9* 6.2*  CL 97* 95*  CO2 25 31  GLUCOSE 88 84  BUN 46* 39*  CREATININE 6.16* 5.55*  CALCIUM  9.1 8.7*  MG 3.0*  --   PHOS 4.7* 4.9*    Liver Function Tests: Recent Labs  Lab 08/29/24 0516 08/30/24 0809  AST 57*  --   ALT 23  --   ALKPHOS 211*  --   BILITOT 0.7  --    PROT 7.3  --   ALBUMIN 3.8 3.0*   Recent Labs  Lab 08/29/24 0516  LIPASE 73*   No results for input(s): AMMONIA in the last 168 hours.  CBC: Recent Labs  Lab 08/29/24 0516  WBC 5.5  NEUTROABS 3.7  HGB 11.6*  HCT 37.5*  MCV 105.3*  PLT 113*    Cardiac Enzymes: No results for input(s): CKTOTAL, CKMB, CKMBINDEX, TROPONINI in the last 168 hours.  BNP: Invalid input(s): POCBNP  CBG: Recent Labs  Lab 08/29/24 1809  GLUCAP 110*    Microbiology: Results for orders placed or performed during the hospital encounter of 08/29/24  Body fluid culture w Gram Stain     Status: None (Preliminary result)   Collection Time: 08/29/24  2:45 PM   Specimen: PATH Cytology Peritoneal fluid  Result Value Ref Range Status   Specimen Description   Final    PERITONEAL Performed at [facility], [address] [place], [place] [address]    Special Requests   Final    NONE Performed at [facility], [address] [place], [place] [address]    Gram Stain NO WBC SEEN NO ORGANISMS SEEN   Final   Culture   Final    NO GROWTH < 12 HOURS Performed at [facility], [address] [place], [place] [address]    Report Status PENDING  Incomplete  Coagulation Studies: Recent Labs    08/29/24 0528  LABPROT 16.0*  INR 1.2     Urinalysis: No results for input(s): COLORURINE, LABSPEC, PHURINE, GLUCOSEU, HGBUR, BILIRUBINUR, KETONESUR, PROTEINUR, UROBILINOGEN, NITRITE, LEUKOCYTESUR in the last 72 hours.  Invalid input(s): APPERANCEUR    Imaging: US  Paracentesis Result Date: 08/29/2024 INDICATION: Patient with a history of heart failure, end-stage renal disease and cirrhosis with recurrent ascites. Interventional Radiology asked to perform a diagnostic and therapeutic paracentesis. EXAM: ULTRASOUND GUIDED PARACENTESIS MEDICATIONS: 1% lidocaine  10 ml COMPLICATIONS: None immediate. PROCEDURE: Informed written consent was  obtained from the patient after a discussion of the risks, benefits and alternatives to treatment. A timeout was performed prior to the initiation of the procedure. Initial ultrasound scanning demonstrates a large amount of ascites within the right lower abdominal quadrant. The right lower abdomen was prepped and draped in the usual sterile fashion. 1% lidocaine  was used for local anesthesia. Following this, a 19 gauge, 7-cm, Yueh catheter was introduced. An ultrasound image was saved for documentation purposes. The paracentesis was performed. The catheter was removed and a dressing was applied. The patient tolerated the procedure well without immediate post procedural complication. Patient received post-procedure intravenous albumin; see nursing notes for details. FINDINGS: A total of approximately 5.8 L of clear yellow fluid was removed. Samples were sent to the laboratory as requested by the clinical team. IMPRESSION: Successful ultrasound-guided paracentesis yielding 5.8 liters of peritoneal fluid. Procedure performed by: Warren Dais, NP Electronically Signed   By: Wilkie Lent M.D.   On: 08/29/2024 15:52   CT ABDOMEN PELVIS W CONTRAST Result Date: 08/29/2024 EXAM: CT ABDOMEN AND PELVIS WITH CONTRAST 08/29/2024 05:54:01 AM TECHNIQUE: CT of the abdomen and pelvis was performed with the administration of 100 mL of iohexol  (OMNIPAQUE ) 300 MG/ML solution. Multiplanar reformatted images are provided for review. Automated exposure control, iterative reconstruction, and/or weight-based adjustment of the mA/kV was utilized to reduce the radiation dose to as low as reasonably achievable. COMPARISON: 08/11/2024 CLINICAL HISTORY: Abdominal pain, acute, nonlocalized; Bowel obstruction suspected. FINDINGS: LOWER CHEST: Unchanged chronic left pleural thickening and calcification. Mild interstitial edema identified within the visualized lung bases. LIVER: Advanced changes of liver cirrhosis. GALLBLADDER AND BILE  DUCTS: Partially decompressed gallbladder. No biliary ductal dilatation. SPLEEN: The spleen is enlarged, measuring 16.3 cm. No focal splenic lesion identified. Perisplenic varicosities are identified. PANCREAS: No acute abnormality. ADRENAL GLANDS: Unchanged right adrenal nodule measuring 1.3 cm most consistent with a benign adenoma. No follow-up imaging recommended. KIDNEYS, URETERS AND BLADDER: End-stage kidneys with multiple cysts are again noted, unchanged in appearance from the previous exam. Decompressed bladder containing 5 mm stone and circumferential wall thickening, image 75/2. No stones in the ureters. No hydronephrosis. No perinephric or periureteral stranding. GI AND BOWEL: Stomach appears normal. No pathologic dilatation of the bowel loops to suggest bowel obstruction. The appendix is visualized and appears normal. Large hemorrhoid along the right lateral aspect of the rectum noted. PERITONEUM AND RETROPERITONEUM: Large volume of abdominal and pelvic ascites. No free air. No focal fluid collections. VASCULATURE: Aorta is normal in caliber. Aortic atherosclerosis. Extensive vascular calcifications identified. Large esophageal and retrocrural varicosities are identified. Perisplenic varicosities are also identified. Large pelvic varicosities including large hemorrhoid along the right lateral aspect of the rectum noted. LYMPH NODES: No lymphadenopathy. REPRODUCTIVE ORGANS: Prostate gland is unremarkable. BONES AND SOFT TISSUES: No acute osseous findings. Bony stigmata of renal osteodystrophy. No focal soft tissue abnormality. IMPRESSION: 1. No evidence of bowel obstruction. 2. Large volume abdominal and pelvic  ascites, most likely related to portal hypertension from cirrhosis; consider diagnostic paracentesis if not previously established. 3. Cirrhosis with portal hypertension manifested by extensive esophageal/retrocrural/perisplenic/pelvic varices and splenomegaly. 4. Decompressed bladder with 5 mm  calculus and circumferential wall thickening, which can be seen with cystitis or chronic outlet obstruction; urologic evaluation as indicated. Electronically signed by: Waddell Calk MD 08/29/2024 06:05 AM EST RP Workstation: [id number]   DG Chest Portable 1 View Result Date: 08/29/2024 EXAM: 1 VIEW XRAY OF THE CHEST 08/29/2024 05:13:00 AM COMPARISON: Chest radiographs 10/13/2023 and earlier. CLINICAL HISTORY: 54 year old male with shortness of breath after missed dialysis. FINDINGS: LUNGS AND PLEURA: Diffuse interstitial prominence with perihilar airspace opacities. No focal pulmonary opacity. No pleural effusion. No pneumothorax. HEART AND MEDIASTINUM: Cardiomegaly. Calcified aorta. BONES AND SOFT TISSUES: Cervical spine fusion hardware. No acute osseous abnormality. ABDOMEN: Visible bowel gas pattern is within normal limits. IMPRESSION: 1. Pulmonary interstitial edema. Electronically signed by: Helayne Hurst MD 08/29/2024 05:26 AM EST RP Workstation: [id number]      Medications:      amLODipine   5 mg Oral Daily   aspirin  EC  81 mg Oral Daily   atorvastatin   10 mg Oral Daily   Chlorhexidine  Gluconate Cloth  6 each Topical Q0600   clopidogrel   75 mg Oral Daily   feeding supplement (NEPRO CARB STEADY)  237 mL Oral BID BM   gabapentin   200 mg Oral Q T,Th,Sat-1800   heparin   5,000 Units Subcutaneous Q12H   polyethylene glycol  17 g Oral Daily   sevelamer  carbonate  800 mg Oral TID WC   HYDROmorphone  (DILAUDID ) injection, lactulose   Assessment/ Plan:  Mario [name] is a 54 y.o.  male with past medical history including hypertension, neuropathy, chronic liver cirrhosis, portal hypertension, and end-stage renal disease on hemodialysis. Patient seen in ED for abdominal pain, fever, and nausea. He has been admitted under observation for Hyperkalemia [E87.5] CHF (congestive heart failure) (HCC) [I50.9] Acute pulmonary edema (HCC) [J81.0] Other ascites [R18.8]  [facility]  [place]/TTS/Lt AVF  Hyperkalemia with end stage renal disease on hemodialysis.  Last treatment on Thursday.  Received dialysis yesterday due to elevated potassium. Potassium remains elevated, 6.2. will order Lokelma  10g today and perform dialysis tomorrow.   2.  Acute pulmonary edema, seen on chest x-ray.  Missed last session of dialysis.  Room air, removed 2.5L of fluid with dialysis.   3. Anemia of chronic kidney disease Lab Results  Component Value Date   HGB 11.6 (L) 08/29/2024    Hgb 11.6. Will continue to monintor  4. Secondary Hyperparathyroidism: with outpatient labs: None  Lab Results  Component Value Date   PTH 266 (H) 08/14/2024   PTH Comment 08/14/2024   CALCIUM  8.7 (L) 08/30/2024   PHOS 4.9 (H) 08/30/2024    Bone minerals within desired range.    LOS: 0 Milany Geck 1/27/20262:54 PM   "

## 2024-08-30 NOTE — Plan of Care (Signed)

## 2024-08-31 LAB — CBC
HCT: 25.2 % — ABNORMAL LOW (ref 39.0–52.0)
Hemoglobin: 8.3 g/dL — ABNORMAL LOW (ref 13.0–17.0)
MCH: 33.3 pg (ref 26.0–34.0)
MCHC: 32.9 g/dL (ref 30.0–36.0)
MCV: 101.2 fL — ABNORMAL HIGH (ref 80.0–100.0)
Platelets: 105 10*3/uL — ABNORMAL LOW (ref 150–400)
RBC: 2.49 MIL/uL — ABNORMAL LOW (ref 4.22–5.81)
RDW: 14.3 % (ref 11.5–15.5)
WBC: 4.8 10*3/uL (ref 4.0–10.5)
nRBC: 0 % (ref 0.0–0.2)

## 2024-08-31 LAB — RENAL FUNCTION PANEL
Albumin: 3.1 g/dL — ABNORMAL LOW (ref 3.5–5.0)
Anion gap: 12 (ref 5–15)
BUN: 53 mg/dL — ABNORMAL HIGH (ref 6–20)
CO2: 26 mmol/L (ref 22–32)
Calcium: 8.3 mg/dL — ABNORMAL LOW (ref 8.9–10.3)
Chloride: 95 mmol/L — ABNORMAL LOW (ref 98–111)
Creatinine, Ser: 6.9 mg/dL — ABNORMAL HIGH (ref 0.61–1.24)
GFR, Estimated: 9 mL/min — ABNORMAL LOW
Glucose, Bld: 133 mg/dL — ABNORMAL HIGH (ref 70–99)
Phosphorus: 5.6 mg/dL — ABNORMAL HIGH (ref 2.5–4.6)
Potassium: 5.9 mmol/L — ABNORMAL HIGH (ref 3.5–5.1)
Sodium: 134 mmol/L — ABNORMAL LOW (ref 135–145)

## 2024-08-31 MED ORDER — DIPHENHYDRAMINE HCL 50 MG/ML IJ SOLN
INTRAMUSCULAR | Status: AC
  Filled 2024-08-31: qty 1

## 2024-08-31 MED ORDER — HYDROMORPHONE HCL 1 MG/ML IJ SOLN
INTRAMUSCULAR | Status: AC
  Filled 2024-08-31: qty 1

## 2024-08-31 MED ORDER — DIPHENHYDRAMINE HCL 50 MG/ML IJ SOLN
25.0000 mg | Freq: Once | INTRAMUSCULAR | Status: AC
  Administered 2024-08-31: 25 mg via INTRAVENOUS

## 2024-08-31 MED ORDER — CALCITRIOL 0.25 MCG PO CAPS
0.5000 ug | ORAL_CAPSULE | Freq: Every day | ORAL | Status: DC
  Administered 2024-08-31 – 2024-09-01 (×2): 0.5 ug via ORAL
  Filled 2024-08-31 (×3): qty 2

## 2024-08-31 NOTE — Plan of Care (Signed)

## 2024-08-31 NOTE — Discharge Instructions (Addendum)
 Food Resources  Agency Name: [facility] Agency Address: [address] [place], [place] [address] Phone: [phone] Website: [email or web address] Service(s) Offered: Housing services, self-sufficiency, congregate meal program, weatherization program, event organiser program, emergency food assistance,  housing counseling, home ownership program, wheels - to work program.  [employer] free for 60 and older at various locations from usaa, Monday-Friday:  [employer], [address] [place], [phone] -[facility], [address] [place] [phone]  -[facility], [address] [place] [phone]  -[address], [address], [place], [phone]  Agency Name: [facility] Address: [phone], [place], [place] [address] Phone: [phone] Website: [email or web address] Service(s) Offered: Home delivered hot, frozen, and emergency  meals. Grocery assistance program which matches  volunteers one-on-one with seniors unable to grocery shop  for themselves. Must be 60 years and older; less than 20  hours of in-home aide service, limited or no driving ability;  live alone or with someone with a disability; live in  [place].  Agency Name: [facility]) Address: [address], [place], [place] [address] Phone: [phone] Service(s) Offered: Food is served to shut-ins, homeless, elderly, and low income people in the community every Saturday (11:30 am-12:30 pm) and Sunday (12:30 pm-1:30pm). Volunteers also offer help and encouragement in seeking employment,  and spiritual guidance.  Agency Name: Department of Social Services Address: [address] [place], [place] [address] Phone: [phone] Service(s) Offered: Child support services; child welfare services; food stamps; Medicaid; work first family assistance; and aid  with fuel,  rent, food and medicine.  Agency Name: Dietitian Address: [address] [place], [place] Phone: [phone] Website: [email or web address] Services Offered: Monday 10:00am-12:00, 8:00pm-9:00pm, and Friday 10:00am-12:00.  Agency Name: [facility] [place] Address: [address] [place], [place] [address] Phone: [phone] Website: www.[email or web address].org Service(s) Offered: Serves weekday meals, open from 11:30 am- 1:00 pm., and 6:30-7:30pm, Monday-Wednesday-Friday distributes food 3:30-6pm, Monday-Wednesday-Friday.  Agency Name: [facility] Address: [address] [place], [place] Phone: [phone] Website: [email or web address] Services Offered: Distributes food the 4th Saturday of the month, starting at 8:00 am  Agency Name: [facility] Address: [phone] [address], [place], [place] [address] Phone: [phone] Website: [email or web address] Service(s) Offered: Bread of life, weekly food pantry. Open Wednesdays from 10:00am-noon.  Agency Name: [facility] [employer] Address: [address] [place], [place], [place] Phone: [phone] Services Offered: Distributes food 9am-1pm, Monday-Thursday. Call for details.  Agency Name: [facility] Address: [address] [place], [place] [address] Phone: [phone] Website: [email or web address] Service(s) Offered: Games Developer. Call for assistance.  Agency Name: [facility] Address: [address], [place], [place] [address] Phone: [phone] Service Offered: Emergency Food Pantry. Call for appointment.  Agency Name: [facility] Address: [address] [place], [place] [address] Phone: [phone] Website: [email or web address] Services Offered: Games Developer. Call for details  Agency Name: [facility] Address: [address] [place], [place] Phone:  [phone] Website: [email or web address] Service(s) Offered: Emergency Food Pantry. Call for details.  Agency Name: [facility] Address: [address] [place], [place] [address] Phone: [phone] or [phone] Website: [email or web address] Service(s) Offered: Distribute food 9am-11:30 am, Tuesday-Friday, and 1-3:30pm, Monday-Friday. Food pantry Monday-Friday 1pm-3pm, fresh items, Mon.-Wed.-Fri.  Agency Name: [facility] Address: [address] [place], [place] [address] Phone: [phone] Website: www.safealamance.org Services Offered: Distribute food Tues and Sats from 9:00am-noon.  Closed 1st Saturday of each month. Call for details  Agency Name: [employer] Address: Fayrene Boatman [facility] [address], [place] [address] Phone: [phone]  Services Offered: Delivers meals every Thursday   Rent/Utility/Housing  Agency Name: [facility] Agency Address: [address] [place], [place] [address] Phone: [phone] Email: [email or web address] Website: [email or web address] Service(s) Offered: Housing services, self-sufficiency, congregate meal program, weatherization program, field seismologist program, emergency food assistance,  housing counseling, home ownership program, wheels -towork program.  Agency Name: [facility] Address: [address] [place], [place] [address] Phone: [phone] (8a-4p) [phone] (8p- 10p) Email: [email or web address] Website: [email or web address] Service(s) Offered: A program for homeless and/or needy men that includes one-on-one counseling, life skills training and job rehabilitation.  Agency Name: [facility] [place] Address: [address] [place], [place] [address] Phone: [phone] Website: www.[email or web address].org Service(s) Offered: Assistance to needy in emergency with  utility bills, heating fuel, and prescriptions. Shelter for homeless 7pm-7am. November 27, 2016 15  Agency Name: [facility] [place] [facility]Development[facility] Disabled) Address: [address], [place], [place] [address] Phone: [phone] Contact Person: Lemond Cart Email: [email or web address] Website: [email or web address] Service(s) Offered: Helps individuals with developmental disabilities move from housing that is more restrictive to homes where they  can achieve greater independence and have more  opportunities.  Agency Name: [facility] Address: [address], [place], [place] [address] Phone: [phone] Email: [email or web address] Website: [email or web address].org Service(s) Offered: Provides affordable housing for low-income families, elderly, and disabled individuals. Offer a wide range of  programs and services, from financial planning to afterschool and summer programs.  Agency Name: Department of Social Services Address: [address] [place], [place] [address] Phone: [phone] Service(s) Offered: Child support services; child welfare services; food stamps; Medicaid; work first family assistance; and aid with fuel,  rent, food and medicine.  Agency Name: [facility] [place] [employer]. Address: [facility] [address], [place], [place]  [address] Phone: [phone] Website: www.familyabuseservices.org Service(s) Offered: 24 hour Crisis Line: [phone]; 24 hour Emergency Shelter; Transitional Housing; Support Groups; Scientist, Physiological; [employer]; Hispanic Outreach: [phone];  Visitation Center: [phone].  Agency Name: [facility] [place]. Address: [address] [place], [place] [address] Phone: [phone] Service(s) Offered: CAP Services; Home and [employer]; Individual or Group Supports; Respite Care Non-Institutional Nursing;  Residential Supports; Respite Care and Personal Care  Services; Transportation; Family and Friends Night; Recreational Activities; Three Nutritious Meals/Snacks; Consultation with Registered Dietician; Twenty-four hour Registered Nurse Access; Daily and [employer]; Camp Green Leaves; [place] for the [employer] (During Summer Months) Bingo Night (Every  Wednesday Night); Special Populations Dance Night  (Every Tuesday Night); Professional Hair Care Services.  Agency Name: [facility] Address: [address], [place], [place] [address] Phone: [phone] Contact Person: Meade High Website: [email or web address].Physicist, Medical) Offered: Residential treatment facility for women; food and  clothing, educational & employment development and  transportation to work; counsellor of financial skills;  parenting and family reunification; emotional and spiritual  support; transitional housing for program graduates.  Agency Name: [facility] Address: [address], [place], [place] [address] Phone: [phone] Email: [email or web address] Website: [email or web address] Service(s) Offered: Public housing units for elderly, disabled, and low income people; housing choice vouchers for income eligible  applicants; shelter plus care vouchers; and Psychologist, Clinical.  Agency Name: [facility] [employer] Address: [address] [place], [place] [address] Phone: [phone] Email: [email or web address] Website: www.habitatalamance.org  Service(s) Offered: Build houses for families in need of decent housing. Each adult in the family must invest 200 hours of labor on  someone elses house, work with volunteers to build their own house, attend classes on budgeting, home maintenance, yard care, and attend homeowner association meetings.  Agency Name: [facility] Address: [address], [place], [place] [address] Phone: [phone] Website:  www.rsli.org Service(s) Offered: Intermediate care facilities for intellectually delayed, Supervised Living in group homes for adults with developmental disabilities, Supervised Living for people who have dual diagnoses (MRMI), Independent Living, Supported Living, respite and a variety of CAP services, pre-vocational services, day supports, and [employer].  Agency Name: [place] [facility] Phone: [phone] Website: www.NCForeclosurePrevention.gov Service(s) Offered: Zero-interest, deferred loans to homeowners struggling to pay their mortgage. Call for more information.   Agency Name: [facility] Agency Address: [address], [place], [place] [address] Phone: [phone] Website: www.[email or web address].org Service(s) Offered: Housing services, self-sufficiency, congregate meal program, and individual development account program.  Agency Name: [facility] [place] Address: [address] [place], [place] [address] Phone: [phone] Email: [email or web address] Website: www.alliedchurches.org Service(s) Offered: Housing the homeless, feeding the hungry, company secretary, job and education related services.  Agency Name: [facility] Address: [address] [place], [place] [address] Phone: [phone] Email: [email or web address] Service(s) Offered: Counseling, problem pregnancy, advocacy for Hispanics, limited emergency financial assistance.  Agency Name: Department of Social Services Address: [address] [place], [place] [address] Phone: [phone] Website: [email or web address] Service(s) Offered: Child support services; child welfare services; SNAP; Medicaid; work first family assistance; and aid with fuel,  rent, food and medicine.  Agency Name: [facility] Address: [address], [place], [place] [address] Phone: [phone] or [phone] Email:  [email or web address] Service(s) Offered: Family services and transient assistance; emergency food, fuel, clothing, limited furniture, utilities; budget counseling, general counseling; give a kid a coat; thrift store; Christmas food and toys. Utility assistance, food pantry, rental  assistance, life sustaining medicine     Transportation Resources for [employer]  Agency Name: [facility] Agency Address: [address] [place], [place] [address] Phone: [phone] Email: [email or web address] Website: [email or web address] Service(s) Offered: Housing services, self-sufficiency, congregate meal program, weatherization program, field seismologist program, emergency food assistance,  housing counseling, home ownership program, wheels-towork program.  Agency Name: [facility] [employer] [phone]) Address: [address] [place], [place] [address] Phone: [phone] Website: www.acta-.com Service(s) Offered: Transportation for bluelinx, subscription and demand response; Dial-a-Ride for citizens 34 years of age or older.  Agency Name: Department of Social Services Address: [address] [place], [place] [address] Phone: [phone] Service(s) Offered: Child support services; child welfare services; food stamps; Medicaid; work first family assistance; and aid with fuel,  rent, food and medicine, transportation assistance.  Agency Name: Disabled [employer] (DAV) Transportation  Network Phone: [phone] Service(s) Offered: Transports veterans to the [facility] medical center. Call  forty-eight hours in advance and leave the name, telephone  number, date, and time of appointment. Veteran will be  contacted by the driver the day before the appointment to  arrange a pick up point    [employer] ACTA currently provides door to door  services. ACTA connects with PART daily for services to [facility]. ACTA also performs contract services to [employer] operates 27 vehicles, all but 3 mini-vans are equipped with lifts for special needs as well as the general public. ACTA drivers are  each CDL certified and trained in First Aid and CPR. ACTA was established in 2002 by [employer]. An independent Industrial/product Designer. ACTA operates via cytogeneticist with required local 10% match funding from [place]. ACTA provides over 80,000 passenger trips each year, including [facility] and [employer] sites.  Call at least by 11 AM one business day prior to needing transportation  [employer].                      [place], [place] [address]     Office Hours: Monday-Friday  8 AM - 5 PM   Rent/Utility/Housing  Agency Name: [facility] Agency Address: [address] [place], [place] [address] Phone: [phone] Email: [email or web address] Website: www.[email or web address].org Service(s) Offered: Housing services, self-sufficiency, congregate meal program, weatherization program, field seismologist program, emergency food assistance,  housing counseling, home ownership program, wheels -towork program.  Agency Name: [facility] Address: [address] [place], [place] [address] Phone: [phone] (8a-4p) [phone] (8p- 10p) Email: [email or web address] Website: [email or web address] Service(s) Offered: A program for homeless and/or needy men that includes one-on-one counseling, life skills training and job rehabilitation.  Agency Name: [facility] [place] Address: [address] [place], [place] [address] Phone: [phone] Website: www.alliedchurches.org Service(s) Offered: Assistance to needy in emergency with utility bills, heating fuel,  and prescriptions. Shelter for homeless 7pm-7am. November 27, 2016 15  Agency Name: [facility] [place] (Developmentally Disabled) Address: [address], [place], [place] [address] Phone: [phone] Contact Person: Lemond Cart Email: [email or web address] Website: [email or web address] Service(s) Offered: Helps individuals with developmental disabilities move from housing that is more restrictive to homes where they  can achieve greater independence and have more  opportunities.  Agency Name: [facility] Address: [address], [place], [place] [address] Phone: [phone] Email: [email or web address] Website: www.[email or web address].org Service(s) Offered: Provides affordable housing for low-income families, elderly, and disabled individuals. Offer a wide range of  programs and services, from financial planning to afterschool and summer programs.  Agency Name: Department of Social Services Address: [address] [place], [place] [address] Phone: [phone] Service(s) Offered: Child support services; child welfare services; food stamps; Medicaid; work first family assistance; and aid with fuel,  rent, food and medicine.  Agency Name: [facility] [place] [employer]. Address: [facility] [address], [place], [place]  [address] Phone: [phone] Website: www.familyabuseservices.org Service(s) Offered: 24 hour Crisis Line: [phone]; 24 hour Emergency Shelter; Transitional Housing; Support Groups; Scientist, Physiological; [employer]; Hispanic Outreach: [phone];  Visitation Center: [phone].  Agency Name: [facility] [place]. Address: [address] [place], [place] [address] Phone: [phone] Service(s) Offered: CAP Services; Home and [employer]; Individual or Group Supports; Respite Care Non-Institutional Nursing;  Residential Supports; Respite Care and Personal Care Services; Transportation; Family  and Friends Night; Recreational Activities; Three Nutritious Meals/Snacks; Consultation with Registered Dietician; Twenty-four hour Registered Nurse Access; Daily and [employer]; Camp Green Leaves; [place] for the [employer] (During Summer Months) Bingo Night (Every  Wednesday Night); Special Populations Dance Night  (Every Tuesday Night); Professional Hair Care Services.  Agency Name: [facility] Address: [address], [place], [place] [address] Phone: [phone] Contact Person: Meade High Website: [email or web address].Physicist, Medical) Offered: Residential treatment facility for women; food and  clothing, educational & employment development and  transportation to work; corporate investment banker;  parenting  and family reunification; emotional and spiritual  support; transitional housing for program graduates.  Agency Name: [facility] Address: [address] [place], [place] [address] Phone: [phone] Email: [email or web address] Website: [email or web address] Service(s) Offered: Public housing units for elderly, disabled, and low income people; housing choice vouchers for income eligible  applicants; shelter plus care vouchers; and Psychologist, Clinical.  Agency Name: [facility] [employer] Address: [address] [place], [place] [address] Phone: [phone] Email: [email or web address] Website: www.habitatalamance.org Service(s) Offered: Build houses for families in need of decent housing. Each adult in the family must invest 200 hours of labor on  someone elses house, work with volunteers to build their own house, attend classes on budgeting, home maintenance, yard care, and attend homeowner association meetings.  Agency Name: [facility] Address: [address], [place], [place] [address] Phone: [phone] Website: www.rsli.org Service(s) Offered:  Intermediate care facilities for intellectually delayed, Supervised Living in group homes for adults with developmental disabilities, Supervised Living for people who have dual diagnoses (MRMI), Independent Living, Supported Living, respite and a variety of CAP services, pre-vocational services, day supports, and [employer].  Agency Name: [place] [facility] [facility] Phone: [phone] Website: www.NCForeclosurePrevention.gov Service(s) Offered: Zero-interest, deferred loans to homeowners struggling to pay their mortgage. Call for more information.   Agency Name: [facility] Agency Address: [address] [place], [place] [address] Phone: [phone] Website: www.alamanceservices.org Service(s) Offered: Housing services, self-sufficiency, congregate meal program, and individual development account program.  Agency Name: [facility] [place] Address: [address] [place], [place] [address] Phone: [phone] Email: [email or web address] Website: www.alliedchurches.org Service(s) Offered: Housing the homeless, feeding the hungry, company secretary, job and education related services.  Agency Name: [facility] Address: [address], [place], [place] [address] Phone: [phone] Email: [email or web address] Service(s) Offered: Counseling, problem pregnancy, advocacy for Hispanics, limited emergency financial assistance.  Agency Name: Department of Social Services Address: [address] [place], [place] [address] Phone: [phone] Website: [email or web address] Service(s) Offered: Child support services; child welfare services; SNAP; Medicaid; work first family assistance; and aid with fuel,  rent, food and medicine.  Agency Name: [facility] Address: [address] [place], [place] [address] Phone: [phone] or [phone] Email: [email or web address] Service(s)  Offered: Family services and transient assistance; emergency food, fuel, clothing, limited furniture, utilities; budget counseling, general counseling; give a kid a coat; thrift store; Christmas food and toys. Utility assistance, food pantry, rental  assistance, life sustaining medicine  Some PCP options in [place] area- not a comprehensive list  [facility]- [phone] [facility]- [phone] [facility]- [phone] [facility]- [phone] [facility]- [phone] Nichole Molly- [phone]  or [facility] Physician Referral Line [phone]

## 2024-08-31 NOTE — Progress Notes (Addendum)
 " PROGRESS NOTE   HPI was taken from Dr. Laurita: Mario [name] is a 54 y.o. male with medical history significant of ESRD on HD TTS, liver cirrhosis, HTN, chronic HFpEF, HLD, chronic left third finger infection, mild nonobstructive PVD, presented with multiple complaints including chest pain, increasing shortness of breath, abdominal pain and left foot pain.   Due to snowstorm on weekend, patient missed his Saturday dialysis.  Yesterday evening he started to feel increasing shortness of breath and could not lie flat on the bed, with sharp-like chest pain worsening with deep breath.  He also complained about  episodes of cramping-like abdominal pain but denies any nauseous vomiting or diarrhea.  In addition he also complained about worsening of left foot pain acute on chronic. ED Course: Afebrile, nontachycardic blood pressure 150/80 O2 saturation 99% on room air.  Chest x-ray showed cardiomegaly and pulmonary congestion, CT abdomen pelvis showed large ascites.  Blood work showed WBC 5.5 hemoglobin 11.6 BUN 46 creatinine 10.1 K6.9 bicarb 25.  EKG showed tented T waves on multiple leads.   Patient was given hyperkalemia cocktail including calcium  gluconate, albuterol  treatment, Lokelma .  Nephrology consulted.     Tan Clopper Chilo  FMW:[id number] DOB: 05-Oct-1970 DOA: 08/29/2024 PCP: Lateef, Munsoor, MD    Assessment & Plan:   Principal Problem:   Acute pulmonary edema (HCC) Active Problems:   CHF (congestive heart failure) (HCC)  Assessment and Plan:  # Acute on chronic HFpEF: likely secondary to missing HD. Volume management w/ HD.   # ESRD: on HD. Missed HD. Nephro following and recs apprec   Hyperkalemia: secondary to missed HD. Lokelma  ordered. Needs HD again. Nephro following and recs apprec   Acute decompensated cirrhosis: w/ ascites. S/p paracentesis on 08/29/24. Continue on lactulose .   Thrombocytopenia: likely secondary to cirrhosis. No need for a transfusion  currently  Macrocytic anemia: likely secondary to cirrhosis. No need for a transfusion currently    Chronic left foot pain: w/ hx of nonobstructive PVD. Was worked up on last admission, PVD study with angiogram showed no obstructive but otherwise heavily calcified arteries in lower extremity. Continue on aspirin , plavix , statin & gabapentin     HTN: continue on amlodipine     History of left hand ulcer: previously found left hand ulcer located on the dorsal third MCP joint has closed. Continue w/ supportive care  Vitamin D  deficiency: Vit D level 18.7, started Calcitrol 0.5 mcg p.o. daily follow with PCP to repeat vitamin D  level after 3 to 6 months.   DVT prophylaxis: heparin   Code Status: full code Family Communication: Disposition Plan: depends on PT/OT recs  Level of care: Progressive  Status is: Inpatient Remains inpatient appropriate because: severity of illness, hyperkalemia Most likely discharge tomorrow am    Consultants:  Nephro   Procedures:  Hemodialysis  Antimicrobials:    Subjective: Pt c/o pain in his left foot, denies any shortness of breath.  Seen after hemodialysis, tolerated procedure well.  Patient was angry and he was asking for food.  Objective: Vitals:   08/31/24 1230 08/31/24 1300 08/31/24 1415 08/31/24 1453  BP: 135/67 127/66 121/61 129/69  Pulse: 75 77 73 71  Resp: 15 14 15    Temp:   98.2 F (36.8 C) 98.1 F (36.7 C)  TempSrc:   Oral Oral  SpO2: 100% 100% 100% 100%  Weight:   77.2 kg     Intake/Output Summary (Last 24 hours) at 08/31/2024 1717 Last data filed at 08/31/2024 1415 Gross per 24 hour  Intake 120 ml  Output 1900 ml  Net -1780 ml   Filed Weights   08/29/24 1226 08/31/24 1104 08/31/24 1415  Weight: 79.6 kg 79.1 kg 77.2 kg    Examination:  General exam: Appears calm and comfortable. Disheveled Respiratory system: decreased breath sounds b/l  Cardiovascular system: S1 & S2 +. No rubs, gallops or clicks.   Gastrointestinal system: Abdomen is nondistended, soft and nontender. Normal bowel sounds heard. Central nervous system: Alert and oriented. Moves all extremities Psychiatry: Judgement and insight appears at baseline. Flat mood and affect    Data Reviewed: I have personally reviewed following labs and imaging studies  CBC: Recent Labs  Lab 08/29/24 0516 08/31/24 1113  WBC 5.5 4.8  NEUTROABS 3.7  --   HGB 11.6* 8.3*  HCT 37.5* 25.2*  MCV 105.3* 101.2*  PLT 113* 105*   Basic Metabolic Panel: Recent Labs  Lab 08/29/24 0516 08/30/24 0809 08/31/24 1113  NA 135 132* 134*  K 6.9* 6.2* 5.9*  CL 97* 95* 95*  CO2 25 31 26   GLUCOSE 88 84 133*  BUN 46* 39* 53*  CREATININE 6.16* 5.55* 6.90*  CALCIUM  9.1 8.7* 8.3*  MG 3.0*  --   --   PHOS 4.7* 4.9* 5.6*   GFR: Estimated Creatinine Clearance: 11.3 mL/min (A) (by C-G formula based on SCr of 6.9 mg/dL (H)). Liver Function Tests: Recent Labs  Lab 08/29/24 0516 08/30/24 0809 08/31/24 1113  AST 57*  --   --   ALT 23  --   --   ALKPHOS 211*  --   --   BILITOT 0.7  --   --   PROT 7.3  --   --   ALBUMIN 3.8 3.0* 3.1*   Recent Labs  Lab 08/29/24 0516  LIPASE 73*   No results for input(s): AMMONIA in the last 168 hours. Coagulation Profile: Recent Labs  Lab 08/29/24 0528  INR 1.2   Cardiac Enzymes: No results for input(s): CKTOTAL, CKMB, CKMBINDEX, TROPONINI in the last 168 hours. BNP (last 3 results) Recent Labs    08/29/24 0516  PROBNP 27,545.0*   HbA1C: No results for input(s): HGBA1C in the last 72 hours. CBG: Recent Labs  Lab 08/29/24 1809  GLUCAP 110*   Lipid Profile: No results for input(s): CHOL, HDL, LDLCALC, TRIG, CHOLHDL, LDLDIRECT in the last 72 hours. Thyroid Function Tests: No results for input(s): TSH, T4TOTAL, FREET4, T3FREE, THYROIDAB in the last 72 hours. Anemia Panel: No results for input(s): VITAMINB12, FOLATE, FERRITIN, TIBC, IRON ,  RETICCTPCT in the last 72 hours. Sepsis Labs: No results for input(s): PROCALCITON, LATICACIDVEN in the last 168 hours.  Recent Results (from the past 240 hours)  Body fluid culture w Gram Stain     Status: None (Preliminary result)   Collection Time: 08/29/24  2:45 PM   Specimen: PATH Cytology Peritoneal fluid  Result Value Ref Range Status   Specimen Description   Final    PERITONEAL Performed at [facility], [address] [place], [place] [address]    Special Requests   Final    NONE Performed at [facility], [address] [place], [place] [address]    Gram Stain NO WBC SEEN NO ORGANISMS SEEN   Final   Culture   Final    NO GROWTH 2 DAYS Performed at [facility], [address], [place], [place] [address]    Report Status PENDING  Incomplete  Anaerobic culture w Gram Stain  Status: None (Preliminary result)   Collection Time: 08/29/24  2:45 PM   Specimen: Peritoneal Washings  Result Value Ref Range Status   Specimen Description PERITONEAL  Final   Special Requests   Final    NONE Performed at [facility], [address], [place], [place] [address]    Culture   Final    NO ANAEROBES ISOLATED; CULTURE IN PROGRESS FOR 5 DAYS   Report Status PENDING  Incomplete         Radiology Studies: No results found.       Scheduled Meds:  amLODipine   5 mg Oral Daily   aspirin  EC  81 mg Oral Daily   atorvastatin   10 mg Oral Daily   calcitRIOL   0.5 mcg Oral Daily   Chlorhexidine  Gluconate Cloth  6 each Topical Q0600   clopidogrel   75 mg Oral Daily   feeding supplement (NEPRO CARB STEADY)  237 mL Oral BID BM   gabapentin   200 mg Oral Q T,Th,Sat-1800   heparin   5,000 Units Subcutaneous Q12H   polyethylene glycol  17 g Oral Daily   sevelamer  carbonate  800 mg Oral TID WC   Continuous Infusions:   LOS: 1 day      Elvan Sor, MD [facility] Pager [phone]  If 7PM-7AM, please contact  night-coverage [email or web address] 08/31/2024, 5:17 PM   "

## 2024-08-31 NOTE — Progress Notes (Signed)
 Hemodialysis Note:  Received patient in bed to unit. Alert and oriented. Informed consent singed and in chart.  Treatment initiated: 1120 Treatment completed: 1415  Access used: Left AVF Access issues: None  Patient tolerated well. Transported back to room, alert without acute distress. Report given to patient's RN.  Total UF removed: 1.9 liters Medications given: benadryl  0.25 mg IV, Dilaudid  0.5 mg IV  Post HD weight: 77.2 Kg  [facility]

## 2024-08-31 NOTE — Progress Notes (Signed)
 " [facility]  ROUNDING NOTE   Subjective:   Mario [name] is a 54 y.o. male with past medical history including hypertension, neuropathy, chronic liver cirrhosis, portal hypertension, and end-stage renal disease on hemodialysis. Patient seen in ED for abdominal pain, fever, and nausea. He has been admitted under observation for Hyperkalemia [E87.5] CHF (congestive heart failure) (HCC) [I50.9] Acute pulmonary edema (HCC) [J81.0] Other ascites [R18.8]  Patient is known to our practice and receives outpatient dialysis treatments at [facility] on a TTS schedule, overseen by [facility] physicians.  Update: Patient seen and evaluated during dialysis   HEMODIALYSIS FLOWSHEET:  Blood Flow Rate (mL/min): 399 mL/min Arterial Pressure (mmHg): -184.43 mmHg Venous Pressure (mmHg): 163.63 mmHg TMP (mmHg): 10.7 mmHg Ultrafiltration Rate (mL/min): 934 mL/min Dialysate Flow Rate (mL/min): 300 ml/min  Complains of left heel pain, recently received pain medications.    Objective:  Vital signs in last 24 hours:  Temp:  [98 F (36.7 C)-99 F (37.2 C)] 98.4 F (36.9 C) (01/28 1104) Pulse Rate:  [66-80] 77 (01/28 1300) Resp:  [12-20] 14 (01/28 1300) BP: (99-153)/(61-78) 127/66 (01/28 1300) SpO2:  [97 %-100 %] 100 % (01/28 1300)  Weight change:  Filed Weights   08/29/24 0830 08/29/24 0835 08/29/24 1226  Weight: 82.1 kg 82.1 kg 79.6 kg    Intake/Output: No intake/output data recorded.   Intake/Output this shift:  Total I/O In: 120 [P.O.:120] Out: -   Physical Exam: General: NAD, resting comfortably  Head: Normocephalic  Eyes: Anicteric  Lungs:  normal effort, on room air  Heart: Regular rate  Abdomen:  Soft, nontender  Extremities:  No peripheral edema  Neurologic: Awake, alert  Skin: Warm,dry. Left heel wound  Access: Lt AVF    Basic Metabolic Panel: Recent Labs  Lab 08/29/24 0516 08/30/24 0809 08/31/24 1113  NA 135 132* 134*  K 6.9* 6.2*  5.9*  CL 97* 95* 95*  CO2 25 31 26   GLUCOSE 88 84 133*  BUN 46* 39* 53*  CREATININE 6.16* 5.55* 6.90*  CALCIUM  9.1 8.7* 8.3*  MG 3.0*  --   --   PHOS 4.7* 4.9* 5.6*    Liver Function Tests: Recent Labs  Lab 08/29/24 0516 08/30/24 0809 08/31/24 1113  AST 57*  --   --   ALT 23  --   --   ALKPHOS 211*  --   --   BILITOT 0.7  --   --   PROT 7.3  --   --   ALBUMIN 3.8 3.0* 3.1*   Recent Labs  Lab 08/29/24 0516  LIPASE 73*   No results for input(s): AMMONIA in the last 168 hours.  CBC: Recent Labs  Lab 08/29/24 0516 08/31/24 1113  WBC 5.5 4.8  NEUTROABS 3.7  --   HGB 11.6* 8.3*  HCT 37.5* 25.2*  MCV 105.3* 101.2*  PLT 113* 105*    Cardiac Enzymes: No results for input(s): CKTOTAL, CKMB, CKMBINDEX, TROPONINI in the last 168 hours.  BNP: Invalid input(s): POCBNP  CBG: Recent Labs  Lab 08/29/24 1809  GLUCAP 110*    Microbiology: Results for orders placed or performed during the hospital encounter of 08/29/24  Body fluid culture w Gram Stain     Status: None (Preliminary result)   Collection Time: 08/29/24  2:45 PM   Specimen: PATH Cytology Peritoneal fluid  Result Value Ref Range Status   Specimen Description   Final    PERITONEAL Performed at [facility], [address] [place],  [place] [address]    Special Requests   Final    NONE Performed at [facility], [address] [place], [place] [address]    Gram Stain NO WBC SEEN NO ORGANISMS SEEN   Final   Culture   Final    NO GROWTH 2 DAYS Performed at [facility], [address], [place], [place] [address]    Report Status PENDING  Incomplete    Coagulation Studies: Recent Labs    08/29/24 0528  LABPROT 16.0*  INR 1.2     Urinalysis: No results for input(s): COLORURINE, LABSPEC, PHURINE, GLUCOSEU, HGBUR, BILIRUBINUR, KETONESUR, PROTEINUR, UROBILINOGEN, NITRITE, LEUKOCYTESUR in the last 72 hours.  Invalid input(s):  APPERANCEUR    Imaging: US  Paracentesis Result Date: 08/29/2024 INDICATION: Patient with a history of heart failure, end-stage renal disease and cirrhosis with recurrent ascites. Interventional Radiology asked to perform a diagnostic and therapeutic paracentesis. EXAM: ULTRASOUND GUIDED PARACENTESIS MEDICATIONS: 1% lidocaine  10 ml COMPLICATIONS: None immediate. PROCEDURE: Informed written consent was obtained from the patient after a discussion of the risks, benefits and alternatives to treatment. A timeout was performed prior to the initiation of the procedure. Initial ultrasound scanning demonstrates a large amount of ascites within the right lower abdominal quadrant. The right lower abdomen was prepped and draped in the usual sterile fashion. 1% lidocaine  was used for local anesthesia. Following this, a 19 gauge, 7-cm, Yueh catheter was introduced. An ultrasound image was saved for documentation purposes. The paracentesis was performed. The catheter was removed and a dressing was applied. The patient tolerated the procedure well without immediate post procedural complication. Patient received post-procedure intravenous albumin; see nursing notes for details. FINDINGS: A total of approximately 5.8 L of clear yellow fluid was removed. Samples were sent to the laboratory as requested by the clinical team. IMPRESSION: Successful ultrasound-guided paracentesis yielding 5.8 liters of peritoneal fluid. Procedure performed by: Warren Dais, NP Electronically Signed   By: Wilkie Lent M.D.   On: 08/29/2024 15:52      Medications:      amLODipine   5 mg Oral Daily   aspirin  EC  81 mg Oral Daily   atorvastatin   10 mg Oral Daily   calcitRIOL   0.5 mcg Oral Daily   Chlorhexidine  Gluconate Cloth  6 each Topical Q0600   clopidogrel   75 mg Oral Daily   feeding supplement (NEPRO CARB STEADY)  237 mL Oral BID BM   gabapentin   200 mg Oral Q T,Th,Sat-1800   heparin   5,000 Units Subcutaneous Q12H    polyethylene glycol  17 g Oral Daily   sevelamer  carbonate  800 mg Oral TID WC   HYDROmorphone  (DILAUDID ) injection, lactulose   Assessment/ Plan:  Mario [name] is a 54 y.o.  male with past medical history including hypertension, neuropathy, chronic liver cirrhosis, portal hypertension, and end-stage renal disease on hemodialysis. Patient seen in ED for abdominal pain, fever, and nausea. He has been admitted under observation for Hyperkalemia [E87.5] CHF (congestive heart failure) (HCC) [I50.9] Acute pulmonary edema (HCC) [J81.0] Other ascites [R18.8]  [facility]/TTS/Lt AVF  Hyperkalemia with end stage renal disease on hemodialysis.  Potassium has been elevated during this admission. 5.9 today. Will correct with dialysis.   2.  Acute pulmonary edema, seen on chest x-ray.  Missed last session of dialysis.  Remains on room air, denies shortness of breath  3. Anemia of chronic kidney disease Lab Results  Component Value Date   HGB 8.3 (L) 08/31/2024  Hgb 8.3, will recheck in am and determine need for ESA  4. Secondary Hyperparathyroidism: with outpatient labs: None  Lab Results  Component Value Date   PTH 266 (H) 08/14/2024   PTH Comment 08/14/2024   CALCIUM  8.3 (L) 08/31/2024   PHOS 5.6 (H) 08/31/2024    Calcium  stable but phos elevated.  Will continue to monitor with dialysis   LOS: 1 Lorenza Winkleman 1/28/20262:19 PM   "

## 2024-09-01 ENCOUNTER — Inpatient Hospital Stay: Payer: MEDICAID

## 2024-09-01 DIAGNOSIS — Z992 Dependence on renal dialysis: Secondary | ICD-10-CM

## 2024-09-01 DIAGNOSIS — J9601 Acute respiratory failure with hypoxia: Secondary | ICD-10-CM

## 2024-09-01 DIAGNOSIS — M20002 Unspecified deformity of left finger(s): Secondary | ICD-10-CM

## 2024-09-01 DIAGNOSIS — S61203A Unspecified open wound of left middle finger without damage to nail, initial encounter: Secondary | ICD-10-CM

## 2024-09-01 DIAGNOSIS — F17210 Nicotine dependence, cigarettes, uncomplicated: Secondary | ICD-10-CM

## 2024-09-01 DIAGNOSIS — N186 End stage renal disease: Secondary | ICD-10-CM

## 2024-09-01 DIAGNOSIS — D631 Anemia in chronic kidney disease: Secondary | ICD-10-CM

## 2024-09-01 LAB — LD, BODY FLUID (OTHER): LD, Body Fluid: 144 [IU]/L

## 2024-09-01 LAB — CBC
HCT: 27.8 % — ABNORMAL LOW (ref 39.0–52.0)
Hemoglobin: 8.9 g/dL — ABNORMAL LOW (ref 13.0–17.0)
MCH: 33.3 pg (ref 26.0–34.0)
MCHC: 32 g/dL (ref 30.0–36.0)
MCV: 104.1 fL — ABNORMAL HIGH (ref 80.0–100.0)
Platelets: 124 10*3/uL — ABNORMAL LOW (ref 150–400)
RBC: 2.67 MIL/uL — ABNORMAL LOW (ref 4.22–5.81)
RDW: 14.2 % (ref 11.5–15.5)
WBC: 4.7 10*3/uL (ref 4.0–10.5)
nRBC: 0 % (ref 0.0–0.2)

## 2024-09-01 LAB — C-REACTIVE PROTEIN: CRP: 6 mg/dL — ABNORMAL HIGH

## 2024-09-01 LAB — BASIC METABOLIC PANEL WITH GFR
Anion gap: 9 (ref 5–15)
BUN: 33 mg/dL — ABNORMAL HIGH (ref 6–20)
CO2: 31 mmol/L (ref 22–32)
Calcium: 8.6 mg/dL — ABNORMAL LOW (ref 8.9–10.3)
Chloride: 97 mmol/L — ABNORMAL LOW (ref 98–111)
Creatinine, Ser: 4.83 mg/dL — ABNORMAL HIGH (ref 0.61–1.24)
GFR, Estimated: 14 mL/min — ABNORMAL LOW
Glucose, Bld: 89 mg/dL (ref 70–99)
Potassium: 4.9 mmol/L (ref 3.5–5.1)
Sodium: 137 mmol/L (ref 135–145)

## 2024-09-01 LAB — ALBUMIN, FLUID (OTHER): Albumin, Body Fluid Other: 1.9 g/dL

## 2024-09-01 LAB — MAGNESIUM: Magnesium: 2.6 mg/dL — ABNORMAL HIGH (ref 1.7–2.4)

## 2024-09-01 LAB — SEDIMENTATION RATE: Sed Rate: 49 mm/h — ABNORMAL HIGH (ref 0–20)

## 2024-09-01 MED ORDER — HYDROCERIN EX CREA
TOPICAL_CREAM | Freq: Two times a day (BID) | CUTANEOUS | Status: DC
  Filled 2024-09-01: qty 113

## 2024-09-01 MED ORDER — ACETAMINOPHEN 500 MG PO TABS
500.0000 mg | ORAL_TABLET | Freq: Three times a day (TID) | ORAL | Status: DC
  Filled 2024-09-01: qty 1

## 2024-09-01 MED ORDER — COLLAGENASE 250 UNIT/GM EX OINT
TOPICAL_OINTMENT | Freq: Every day | CUTANEOUS | Status: DC
  Filled 2024-09-01: qty 30

## 2024-09-01 MED ORDER — HYDROMORPHONE HCL 2 MG PO TABS
2.0000 mg | ORAL_TABLET | Freq: Four times a day (QID) | ORAL | Status: DC | PRN
  Administered 2024-09-01 – 2024-09-02 (×5): 2 mg via ORAL
  Filled 2024-09-01 (×6): qty 1

## 2024-09-01 NOTE — Consult Note (Signed)
 PODIATRY / FOOT AND ANKLE SURGERY CONSULTATION NOTE  Requesting Physician: Dr. Von  Reason for consult: L 5th MTPJ ulcer   HPI: Mario [name] is a 54 y.o. 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  He was admitted for other issues but podiatry was consulted for another eval of the area.  At his last admission about 3 weeks ago, MRI showed possible osteo of the 5th MTPJ, x-ray negative, lab work negative including ESR/CRP/WBC.  He had CTA with intervention at last admission as well.  He continues to have chronic pain to the area.  He has ESRD on dialysis and hx of PVD.  PMHx:  Past Medical History:  Diagnosis Date   Abdominal pain 08/28/2016   Acute hyperkalemia 10/28/2016   Chest pain 04/11/2016   Chronic combined systolic and diastolic CHF (congestive heart failure) (HCC)    Cirrhosis (HCC)    Dialysis patient    Edentulous    ESRD (end stage renal disease) on dialysis (HCC) 01/25/2015   Fluid overload 04/12/2016   HTN (hypertension) 10/13/2016   Hyperkalemia 11/27/2016   Hypertension    Mitral regurgitation    Pulmonary edema 07/03/2016   Renal disorder    Renal insufficiency    Scrotal infection 01/25/2015    Surgical Hx:  Past Surgical History:  Procedure Laterality Date   ANTERIOR CERVICAL CORPECTOMY N/A 04/05/2019   Procedure: ANTERIOR CERVICAL CORPECTOMY Cervical six and Cervical seven with strut graft fusion from Cervical five-Thoracic one   ;  Surgeon: Louis Shove, MD;  Location: [facility] OR;  Service: Neurosurgery;  Laterality: N/A;   AV FISTULA PLACEMENT     LOWER EXTREMITY ANGIOGRAPHY Left 08/12/2024   Procedure: Lower Extremity Angiography;  Surgeon: Marea Selinda RAMAN, MD;  Location: [facility] INVASIVE CV LAB;  Service: Cardiovascular;  Laterality: Left;   POSTERIOR CERVICAL FUSION/FORAMINOTOMY N/A 04/05/2019   Procedure: Posterior cervical fusion utilizing segmental instrumentation from Cervical five-Thoracic one ;  Surgeon: Louis Shove, MD;  Location: [facility]  OR;  Service: Neurosurgery;  Laterality: N/A;    FHx:  Family History  Problem Relation Age of Onset   Kidney failure Father    Diabetes Mother     Social History:  reports that he has been smoking cigarettes. He has a 6.3 pack-year smoking history. He has never used smokeless tobacco. He reports that he does not drink alcohol and does not use drugs.  Allergies: Allergies[1]   Medications Prior to Admission  Medication Sig Dispense Refill   acetaminophen  (TYLENOL ) 325 MG tablet Take 2 tablets (650 mg total) by mouth every 6 (six) hours as needed for mild pain (pain score 1-3) or fever (or Fever >/= 101). 20 tablet 0   amLODipine  (NORVASC ) 5 MG tablet Take 1 tablet (5 mg total) by mouth daily. 30 tablet 2   ascorbic acid  (VITAMIN C ) 500 MG tablet Take 1 tablet (500 mg total) by mouth 2 (two) times daily. 60 tablet 0   aspirin  EC 81 MG tablet Take 1 tablet (81 mg total) by mouth daily. Swallow whole. 30 tablet 12   atorvastatin  (LIPITOR) 10 MG tablet Take 1 tablet (10 mg total) by mouth daily. 30 tablet 6   clopidogrel  (PLAVIX ) 75 MG tablet Take 1 tablet (75 mg total) by mouth daily. 30 tablet 6   gabapentin  (NEURONTIN ) 100 MG capsule Take 2 capsules (200 mg total) by mouth every Tuesday, Thursday, and Saturday at 6 PM. 30 capsule 1   multivitamin (RENA-VIT) TABS tablet Take  1 tablet by mouth at bedtime. 30 tablet 0   polyethylene glycol (MIRALAX  / GLYCOLAX ) 17 g packet Take 17 g by mouth daily. 14 each 0   sevelamer  carbonate (RENVELA ) 800 MG tablet Take 1 tablet (800 mg total) by mouth 3 (three) times daily with meals. 90 tablet 3   Vitamin D , Ergocalciferol , (DRISDOL ) 1.25 MG (50000 UNIT) CAPS capsule Take 1 capsule (50,000 Units total) by mouth every 7 (seven) days. 5 capsule 3   prednisoLONE  acetate (PRED FORTE ) 1 % ophthalmic suspension Place 1-2 drops into the right eye every 4 (four) hours as needed. (Patient not taking: Reported on 08/11/2024)      Physical Exam: General: Alert  and oriented.  No apparent distress.  Vascular: DP/PT pulses nonpalpable, CFT intact to digits, no hair growth to BLE, mild BLE nonpitting edema  Neuro: Light touch sensation intact to both feet.  Derm:L 5th MTPJ ulceration appears as stable eschar, no associated erythema or edema present, wound measures 1 x 1.  No active drainage but does have POP  MSK: POP L 5th MTPJ   Results for orders placed or performed during the hospital encounter of 08/29/24 (from the past 48 hours)  CBC     Status: Abnormal   Collection Time: 08/31/24 11:13 AM  Result Value Ref Range   WBC 4.8 4.0 - 10.5 K/uL   RBC 2.49 (L) 4.22 - 5.81 MIL/uL   Hemoglobin 8.3 (L) 13.0 - 17.0 g/dL   HCT 74.7 (L) 60.9 - 47.9 %   MCV 101.2 (H) 80.0 - 100.0 fL   MCH 33.3 26.0 - 34.0 pg   MCHC 32.9 30.0 - 36.0 g/dL   RDW 85.6 88.4 - 84.4 %   Platelets 105 (L) 150 - 400 K/uL   nRBC 0.0 0.0 - 0.2 %    Comment: Performed at [facility], [address] [place], [place] [address]  Renal function panel     Status: Abnormal   Collection Time: 08/31/24 11:13 AM  Result Value Ref Range   Sodium 134 (L) 135 - 145 mmol/L   Potassium 5.9 (H) 3.5 - 5.1 mmol/L   Chloride 95 (L) 98 - 111 mmol/L   CO2 26 22 - 32 mmol/L   Glucose, Bld 133 (H) 70 - 99 mg/dL    Comment: Glucose reference range applies only to samples taken after fasting for at least 8 hours.   BUN 53 (H) 6 - 20 mg/dL   Creatinine, Ser 3.09 (H) 0.61 - 1.24 mg/dL   Calcium  8.3 (L) 8.9 - 10.3 mg/dL   Phosphorus 5.6 (H) 2.5 - 4.6 mg/dL   Albumin 3.1 (L) 3.5 - 5.0 g/dL   GFR, Estimated 9 (L) >60 mL/min    Comment: (NOTE) Calculated using the CKD-EPI Creatinine Equation (2021)    Anion gap 12 5 - 15    Comment: Performed at [facility], [address] [place], [place] [address]  Basic metabolic panel with GFR     Status: Abnormal   Collection Time: 09/01/24  3:46 AM  Result Value Ref Range   Sodium 137 135 - 145 mmol/L   Potassium 4.9 3.5 - 5.1  mmol/L   Chloride 97 (L) 98 - 111 mmol/L   CO2 31 22 - 32 mmol/L   Glucose, Bld 89 70 - 99 mg/dL    Comment: Glucose reference range applies only to samples taken after fasting for at least 8 hours.   BUN 33 (H) 6 - 20 mg/dL  Creatinine, Ser 4.83 (H) 0.61 - 1.24 mg/dL   Calcium  8.6 (L) 8.9 - 10.3 mg/dL   GFR, Estimated 14 (L) >60 mL/min    Comment: (NOTE) Calculated using the CKD-EPI Creatinine Equation (2021)    Anion gap 9 5 - 15    Comment: Performed at [facility], [address] [place], [place] [address]  CBC     Status: Abnormal   Collection Time: 09/01/24  3:46 AM  Result Value Ref Range   WBC 4.7 4.0 - 10.5 K/uL   RBC 2.67 (L) 4.22 - 5.81 MIL/uL   Hemoglobin 8.9 (L) 13.0 - 17.0 g/dL   HCT 72.1 (L) 60.9 - 47.9 %   MCV 104.1 (H) 80.0 - 100.0 fL   MCH 33.3 26.0 - 34.0 pg   MCHC 32.0 30.0 - 36.0 g/dL   RDW 85.7 88.4 - 84.4 %   Platelets 124 (L) 150 - 400 K/uL   nRBC 0.0 0.0 - 0.2 %    Comment: Performed at [facility], [address], [place], [place] [address]  Magnesium     Status: Abnormal   Collection Time: 09/01/24  3:46 AM  Result Value Ref Range   Magnesium 2.6 (H) 1.7 - 2.4 mg/dL    Comment: Performed at [facility], [address] [place], [place] [address]  Sedimentation rate     Status: Abnormal   Collection Time: 09/01/24  9:31 AM  Result Value Ref Range   Sed Rate 49 (H) 0 - 20 mm/hr    Comment: Performed at [facility], [address] [place], [place] [address]  C-reactive protein     Status: Abnormal   Collection Time: 09/01/24  9:31 AM  Result Value Ref Range   CRP 6.0 (H) <1.0 mg/dL    Comment: Performed at [facility], [address], [place], [place] [address]   DG Foot Complete Left Result Date: 09/01/2024 EXAM: 3 OR MORE VIEW(S) XRAY OF THE LEFT FOOT 09/01/2024 08:37:00 AM COMPARISON: 08/11/2024 CLINICAL HISTORY: Osteomyelitis. ICD10: 142991 Osteomyelitis (HCC). FINDINGS: BONES AND JOINTS:  Remote healed fracture deformity of the fifth metatarsal neck. No erosive changes are noted. No malalignment. SOFT TISSUES: Mild soft tissue swelling of the lateral foot. Extensive vascular calcifications. IMPRESSION: 1. No acute findings of osteomyelitis. 2. Mild soft tissue swelling of the lateral foot. Electronically signed by: Oneil Devonshire MD 09/01/2024 06:28 PM EST RP Workstation: [id number]   DG Hand 2 View Left Result Date: 09/01/2024 EXAM: 2 VIEW(S) XRAY OF THE LEFT HAND 09/01/2024 12:55:00 PM COMPARISON: 08/11/2024 CLINICAL HISTORY: Infection. FINDINGS: BONES AND JOINTS: Erosive changes at the third metacarpal head with partial healing stable from previous exam. Persistent subluxation at the third metacarpophalangeal joint. SOFT TISSUES: Soft tissue swelling of the third digit, with overall improvement. Vascular calcifications. IMPRESSION: 1. Erosive changes at the third metacarpal head with partial healing, unchanged. 2. Persistent subluxation at the third metacarpophalangeal joint. 3. Soft tissue swelling of the third digit, improved. Electronically signed by: Waddell Calk MD 09/01/2024 01:58 PM EST RP Workstation: [id number]    Blood pressure 130/76, pulse 77, temperature 98.2 F (36.8 C), temperature source Oral, resp. rate 18, weight 52.5 kg, SpO2 100%.  Assessment Chronic stable eschar L 5th MTPJ Moderate PVD with calcific vessels ESRD on dialysis  Plan -Patient seen and examined -Previous MRI reviewed, new x-rays reviewed and compared to previous, no changes.  MRI with some mild marrow edema to the lateral 5th met head, does have bony  exostosis to the area with some cystic changes but no obvious evidence of osteomyelitis.  WBC WNL, ESR elevated some since 3 weeks ago, CRP WNL. -Wound debridement performed as described below.  No bone exposed.  Eschar is very sensitive so believe it would be best for enzymatic debridement with santyl . -Applied betadine wet to dry dressing. -Dressing  orders placed - santyl  (if not available then medihoney) to wound, 4x4, abd, kerlix, ace with mild compression.  Change daily or PRN saturation/loosening -Appreciate infectious disease recs for Abx management -No surgery indicated at this time.  100% excisional subcutaneous debridement of ulcer: Location:  L 5th MTPJ lateral Pre-debridement measurement: 1 x 1 cm Post-debridement measurement: 1 x 1 x 0.3 cm Tissue removed:   Eschar, HPK tissue, biofilm, fibrous tissue Ulcer was debrided sharply with combination of tissue nippers and scalpel blade into the subcutaneous tissue.   Podiatry team to sign off, f/u in outpatient clinic in 1-2 weeks.  Patient no showed to his last visit since discharge from last admission.  Prentice Lee, DPM 09/01/2024, 9:11 PM         [1]  Allergies Allergen Reactions   Betadine [Povidone Iodine ] Rash   Povidone-Iodine  Dermatitis

## 2024-09-01 NOTE — TOC Initial Note (Signed)
 Transition of Care [facility]) - Initial/Assessment Note    Patient Details  Name: Mario [name] MRN: [id number] Date of Birth: 1970-08-21  [facility]) CM/SW Contact:    Shasta DELENA Daring, RN Phone Number: 09/01/2024, 1:03 PM  Clinical Narrative:                 RNCM met with patient. He was alone in room, lying in bed. Used [employer] [place], #[id number].  Patient states he lives alone in a mobile home. He says he does not drive but has friends he can call for a ride somewhere if he needs it. Says he cannot remember the name of his PCP or where the office is, but says, its a Chinese woman.  Uses [facility] pharmacy on [address].  Says he cannot always afford his medicaition.  Unable to saw which medication, specifically, is expensive. Patient said he will need a cab voucher to get home at discharge.  Chart review indicated a PCP visit in 2021. No visits since then.  Will add PCP resources to AVS.   Will add transportation, housing, and food resources to the chart. . Expected Discharge Plan: Home/Self Care     Patient Goals and CMS Choice            Expected Discharge Plan and Services       Living arrangements for the past 2 months: Single Family Home (mobile home)                                      Prior Living Arrangements/Services Living arrangements for the past 2 months: Single Family Home (mobile home) Lives with:: Self Patient language and need for interpreter reviewed:: Yes Do you feel safe going back to the place where you live?: Yes      Need for Family Participation in Patient Care: Yes (Comment) Care giver support system in place?: Yes (comment)   Criminal Activity/Legal Involvement Pertinent to Current Situation/Hospitalization: No - Comment as needed  Activities of Daily Living   ADL Screening (condition at time of admission) Independently performs ADLs?: Yes (appropriate for developmental age) Is the  patient deaf or have difficulty hearing?: No Does the patient have difficulty seeing, even when wearing glasses/contacts?: No Does the patient have difficulty concentrating, remembering, or making decisions?: No  Permission Sought/Granted                  Emotional Assessment Appearance:: Appears stated age Attitude/Demeanor/Rapport: Avoidant, Lethargic Affect (typically observed): Agitated Orientation: : Oriented to Self, Oriented to Place, Oriented to  Time, Oriented to Situation Alcohol / Substance Use: Not Applicable Psych Involvement: No (comment)  Admission diagnosis:  Hyperkalemia [E87.5] CHF (congestive heart failure) (HCC) [I50.9] Acute pulmonary edema (HCC) [J81.0] Other ascites [R18.8] Patient Active Problem List   Diagnosis Date Noted   Acute pulmonary edema (HCC) 08/29/2024   CHF (congestive heart failure) (HCC) 08/29/2024   Left foot pain 08/17/2024   Wound infection 08/16/2024   Chronic osteomyelitis of left foot (HCC) 08/11/2024   Osteomyelitis (HCC) 08/11/2024   Nausea and vomiting 10/16/2021   Ascites 10/16/2021   Generalized weakness 10/16/2021   Sepsis (HCC) 10/15/2021   Encounter to establish care 05/03/2020   Near syncope 04/15/2020   Soft tissue mass in left temple area    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   Tobacco abuse 04/16/2019   Anemia in ESRD (end-stage renal  disease) (HCC) 04/16/2019   Neck pain, bilateral posterior 04/16/2019   Cervical spinal stenosis 04/13/2019   Cervical vertebral fracture (HCC) 04/13/2019   Neck pain 04/03/2019   Cervical radiculopathy 04/03/2019   Hypertensive urgency 04/01/2019   Intractable abdominal pain 05/01/2018   Gastroenteritis 10/02/2017   Abdominal pain, acute 08/21/2017   Malnutrition of moderate degree 08/21/2017   SBP (spontaneous bacterial peritonitis) (HCC) 06/09/2017   Erroneous encounter - disregard 03/04/2017   Hyperkalemia 11/27/2016   Acute hyperkalemia 10/28/2016   Chronic diastolic  (congestive) heart failure (HCC) 10/13/2016   Cirrhosis (HCC) 10/13/2016   HTN (hypertension), malignant 10/13/2016   Abdominal pain 08/28/2016   Pulmonary edema 07/03/2016   Fluid overload 04/12/2016   Chest pain 04/11/2016   Scrotal infection 01/25/2015   ESRD on dialysis (HCC) 01/25/2015   PCP:  Lateef, Munsoor, MD Pharmacy:   [facility] [address]), [place] - [address] [place] ([place]) [place] [address] Phone: [phone] Fax: [phone]  [facility] [address] [place] [address] Phone: [phone] Fax: [phone]  [facility] [address] [place] [address] Phone: [phone] Fax: [phone]  [facility] [address] [place] - [address] [place] [address] Phone: [phone] Fax: [phone]     Social Drivers of Health (SDOH) Social History: SDOH Screenings   Food Insecurity: Food Insecurity Present (08/29/2024)  Housing: High Risk (08/29/2024)  Transportation Needs: Unmet Transportation Needs (08/29/2024)  Utilities: At Risk (08/29/2024)  Social Connections: Moderately Isolated (08/11/2024)  Tobacco Use: High Risk (08/11/2024)   SDOH Interventions: Food Insecurity Interventions: Inpatient TOC, Other (Comment) (resources added to avs) Housing Interventions: Inpatient TOC, Other (Comment) (resources added to AVS) Transportation Interventions: Inpatient TOC, Other (Comment) (Resources added to AVS) Utilities Interventions: Inpatient TOC, Other (Comment) (Resources added to AVS)   Readmission Risk Interventions    09/01/2024   12:57 PM  Readmission Risk Prevention Plan  Transportation Screening Complete  PCP or Specialist Appt within 3-5 Days --  HRI or Home Care Consult Complete  Social Work Consult for Recovery Care Planning/Counseling Complete  Palliative Care Screening Complete   Medication Review Oceanographer) Complete

## 2024-09-01 NOTE — Progress Notes (Signed)
 " PROGRESS NOTE   HPI was taken from Dr. Laurita: Mario [name] is a 54 y.o. male with medical history significant of ESRD on HD TTS, liver cirrhosis, HTN, chronic HFpEF, HLD, chronic left third finger infection, mild nonobstructive PVD, presented with multiple complaints including chest pain, increasing shortness of breath, abdominal pain and left foot pain.   Due to snowstorm on weekend, patient missed his Saturday dialysis.  Yesterday evening he started to feel increasing shortness of breath and could not lie flat on the bed, with sharp-like chest pain worsening with deep breath.  He also complained about  episodes of cramping-like abdominal pain but denies any nauseous vomiting or diarrhea.  In addition he also complained about worsening of left foot pain acute on chronic. ED Course: Afebrile, nontachycardic blood pressure 150/80 O2 saturation 99% on room air.  Chest x-ray showed cardiomegaly and pulmonary congestion, CT abdomen pelvis showed large ascites.  Blood work showed WBC 5.5 hemoglobin 11.6 BUN 46 creatinine 10.1 K6.9 bicarb 25.  EKG showed tented T waves on multiple leads.   Patient was given hyperkalemia cocktail including calcium  gluconate, albuterol  treatment, Lokelma .  Nephrology consulted.     Reg Bircher Champion  FMW:[id number] DOB: [birth year] DOA: 08/29/2024 PCP: Lateef, Munsoor, MD    Assessment & Plan:   Principal Problem:   Acute pulmonary edema (HCC) Active Problems:   CHF (congestive heart failure) (HCC)  Assessment and Plan:  # Acute on chronic HFpEF: likely secondary to missing HD. Volume management w/ HD.   # ESRD: on HD. Missed HD. Nephro following and recs apprec   Hyperkalemia: secondary to missed HD. Lokelma  ordered. Needs HD again. Nephro following and recs apprec   Acute decompensated cirrhosis: w/ ascites. S/p paracentesis on 08/29/24. Continue on lactulose .   Thrombocytopenia: likely secondary to cirrhosis. No need for a transfusion  currently  Macrocytic anemia: likely secondary to cirrhosis. No need for a transfusion currently    # Chronic left foot pain: w/ hx of nonobstructive PVD. Was worked up on last admission, PVD study with angiogram showed no obstructive but otherwise heavily calcified arteries in lower extremity. Continue on aspirin , plavix , statin & gabapentin   08/11/2024 MRI left foot: 1. Small lateral erosion of the head of the 5th metatarsal with adjacent marrow edema and overlying soft tissue thinning/possible ulceration, raising concern for early osteomyelitis. 2. Trace subcutaneous edema along the dorsum of the forefoot, which can be seen with cellulitis. 3. Mild degenerative arthropathy at the 1st metatarsophalangeal joint. 1/29 x-ray left foot: 1. No acute findings of osteomyelitis. 2. Mild soft tissue swelling of the lateral foot. Podiatry consulted ID consulted for possible antibiotics   HTN: continue on amlodipine     History of left hand ulcer: previously found left hand ulcer located on the dorsal third MCP joint has closed. Continue w/ supportive care 1/29 x-ray left hand: 1. Erosive changes at the third metacarpal head with partial healing, unchanged. 2. Persistent subluxation at the third metacarpophalangeal joint. 3. Soft tissue swelling of the third digit, improved. - Patient was recommended to follow-up with hand surgery as an outpatient.  Patient missed appointment due to being in the hospital.  Patient was advised to reschedule appointment.   Vitamin D  deficiency: Vit D level 18.7, started Calcitrol 0.5 mcg p.o. daily follow with PCP to repeat vitamin D  level after 3 to 6 months.   DVT prophylaxis: heparin   Code Status: full code Family Communication: Disposition Plan: depends on PT/OT recs  Level of care: Progressive  Status  is: Inpatient Remains inpatient appropriate because: severity of illness, hyperkalemia Most likely discharge tomorrow am    Consultants:  Nephro   Podiatry ID  Procedures:  Hemodialysis  Antimicrobials:    Subjective: No significant events overnight.  Patient's shortness of breath is stable.  Patient is mainly complaining of pain in the left foot and some drainage from the left hand chronic ulcer.  Patient missed appointment with the hand surgery and he is asking for antibiotics for left foot.  Patient was frustrated as he has no money and does not drive, having difficulty keeping with appointments. Counseling was done and consulted podiatry and ID to see if we can help him what ever he needs.  X-rays ordered as above.   Objective: Vitals:   09/01/24 0500 09/01/24 0732 09/01/24 1141 09/01/24 1519  BP:  121/62 133/71 130/76  Pulse:  72 71 77  Resp:      Temp:  98.4 F (36.9 C) 98.6 F (37 C) 98.2 F (36.8 C)  TempSrc:  Oral Oral Oral  SpO2:  98% 100% 100%  Weight: 52.5 kg       Intake/Output Summary (Last 24 hours) at 09/01/2024 1855 Last data filed at 09/01/2024 1300 Gross per 24 hour  Intake 440 ml  Output --  Net 440 ml   Filed Weights   08/31/24 1104 08/31/24 1415 09/01/24 0500  Weight: 79.1 kg 77.2 kg 52.5 kg    Examination:  General exam: Appears calm and comfortable. Disheveled Respiratory system: decreased breath sounds b/l  Cardiovascular system: S1 & S2 +. No rubs, gallops or clicks.  Gastrointestinal system: Abdomen is nondistended, soft and nontender. Normal bowel sounds heard. Central nervous system: Alert and oriented. Moves all extremities Extremities: Left foot, left lateral ulcer with erythema and tenderness.  Heel ulcer covered with ABD.  Left hand third metacarpophalangeal joint tenderness, no erythema or discharge noticed, dry skin, healing ulcer. Psychiatry: Anxious and irritable.  Poor judgment and insight about his condition.     Data Reviewed: I have personally reviewed following labs and imaging studies  CBC: Recent Labs  Lab 08/29/24 0516 08/31/24 1113 09/01/24 0346  WBC 5.5  4.8 4.7  NEUTROABS 3.7  --   --   HGB 11.6* 8.3* 8.9*  HCT 37.5* 25.2* 27.8*  MCV 105.3* 101.2* 104.1*  PLT 113* 105* 124*   Basic Metabolic Panel: Recent Labs  Lab 08/29/24 0516 08/30/24 0809 08/31/24 1113 09/01/24 0346  NA 135 132* 134* 137  K 6.9* 6.2* 5.9* 4.9  CL 97* 95* 95* 97*  CO2 25 31 26 31   GLUCOSE 88 84 133* 89  BUN 46* 39* 53* 33*  CREATININE 6.16* 5.55* 6.90* 4.83*  CALCIUM  9.1 8.7* 8.3* 8.6*  MG 3.0*  --   --  2.6*  PHOS 4.7* 4.9* 5.6*  --    GFR: Estimated Creatinine Clearance: 13 mL/min (A) (by C-G formula based on SCr of 4.83 mg/dL (H)). Liver Function Tests: Recent Labs  Lab 08/29/24 0516 08/30/24 0809 08/31/24 1113  AST 57*  --   --   ALT 23  --   --   ALKPHOS 211*  --   --   BILITOT 0.7  --   --   PROT 7.3  --   --   ALBUMIN 3.8 3.0* 3.1*   Recent Labs  Lab 08/29/24 0516  LIPASE 73*   No results for input(s): AMMONIA in the last 168 hours. Coagulation Profile: Recent Labs  Lab 08/29/24 0528  INR 1.2  Cardiac Enzymes: No results for input(s): CKTOTAL, CKMB, CKMBINDEX, TROPONINI in the last 168 hours. BNP (last 3 results) Recent Labs    08/29/24 0516  PROBNP 27,545.0*   HbA1C: No results for input(s): HGBA1C in the last 72 hours. CBG: Recent Labs  Lab 08/29/24 1809  GLUCAP 110*   Lipid Profile: No results for input(s): CHOL, HDL, LDLCALC, TRIG, CHOLHDL, LDLDIRECT in the last 72 hours. Thyroid Function Tests: No results for input(s): TSH, T4TOTAL, FREET4, T3FREE, THYROIDAB in the last 72 hours. Anemia Panel: No results for input(s): VITAMINB12, FOLATE, FERRITIN, TIBC, IRON , RETICCTPCT in the last 72 hours. Sepsis Labs: No results for input(s): PROCALCITON, LATICACIDVEN in the last 168 hours.  Recent Results (from the past 240 hours)  Body fluid culture w Gram Stain     Status: None (Preliminary result)   Collection Time: 08/29/24  2:45 PM   Specimen: PATH Cytology  Peritoneal fluid  Result Value Ref Range Status   Specimen Description   Final    PERITONEAL Performed at [facility], [address], [place], [place] [address]    Special Requests   Final    NONE Performed at [facility], [address] [place], [place] [address]    Gram Stain NO WBC SEEN NO ORGANISMS SEEN   Final   Culture   Final    NO GROWTH 3 DAYS Performed at [facility], [address] [place], [place] [address]    Report Status PENDING  Incomplete  Anaerobic culture w Gram Stain     Status: None (Preliminary result)   Collection Time: 08/29/24  2:45 PM   Specimen: Peritoneal Washings  Result Value Ref Range Status   Specimen Description PERITONEAL  Final   Special Requests   Final    NONE Performed at [facility], [address] [place], [place] [address]    Culture   Final    NO ANAEROBES ISOLATED; CULTURE IN PROGRESS FOR 5 DAYS   Report Status PENDING  Incomplete         Radiology Studies: DG Foot Complete Left Result Date: 09/01/2024 EXAM: 3 OR MORE VIEW(S) XRAY OF THE LEFT FOOT 09/01/2024 08:37:00 AM COMPARISON: 08/11/2024 CLINICAL HISTORY: Osteomyelitis. ICD10: 142991 Osteomyelitis (HCC). FINDINGS: BONES AND JOINTS: Remote healed fracture deformity of the fifth metatarsal neck. No erosive changes are noted. No malalignment. SOFT TISSUES: Mild soft tissue swelling of the lateral foot. Extensive vascular calcifications. IMPRESSION: 1. No acute findings of osteomyelitis. 2. Mild soft tissue swelling of the lateral foot. Electronically signed by: Oneil Devonshire MD 09/01/2024 06:28 PM EST RP Workstation: [id number]   DG Hand 2 View Left Result Date: 09/01/2024 EXAM: 2 VIEW(S) XRAY OF THE LEFT HAND 09/01/2024 12:55:00 PM COMPARISON: 08/11/2024 CLINICAL HISTORY: Infection. FINDINGS: BONES AND JOINTS: Erosive changes at the third metacarpal head with partial healing stable from previous exam. Persistent subluxation at the third  metacarpophalangeal joint. SOFT TISSUES: Soft tissue swelling of the third digit, with overall improvement. Vascular calcifications. IMPRESSION: 1. Erosive changes at the third metacarpal head with partial healing, unchanged. 2. Persistent subluxation at the third metacarpophalangeal joint. 3. Soft tissue swelling of the third digit, improved. Electronically signed by: Waddell Calk MD 09/01/2024 01:58 PM EST RP Workstation: [id number]         Scheduled Meds:  acetaminophen   500 mg Oral TID   amLODipine   5 mg Oral Daily   aspirin  EC  81 mg Oral Daily   atorvastatin   10 mg Oral Daily  calcitRIOL   0.5 mcg Oral Daily   Chlorhexidine  Gluconate Cloth  6 each Topical Q0600   clopidogrel   75 mg Oral Daily   collagenase    Topical Daily   feeding supplement (NEPRO CARB STEADY)  237 mL Oral BID BM   gabapentin   200 mg Oral Q T,Th,Sat-1800   heparin   5,000 Units Subcutaneous Q12H   polyethylene glycol  17 g Oral Daily   sevelamer  carbonate  800 mg Oral TID WC   Continuous Infusions:   LOS: 2 days   Total time spent: 55 minutes   Elvan Sor, MD [facility] Pager [phone]  If 7PM-7AM, please contact night-coverage [email or web address] 09/01/2024, 6:55 PM   "

## 2024-09-01 NOTE — Progress Notes (Signed)
 " [facility]  ROUNDING NOTE   Subjective:   Mario [name] is a 54 y.o. male with past medical history including hypertension, neuropathy, chronic liver cirrhosis, portal hypertension, and end-stage renal disease on hemodialysis. Patient seen in ED for abdominal pain, fever, and nausea. He has been admitted under observation for Hyperkalemia [E87.5] CHF (congestive heart failure) (HCC) [I50.9] Acute pulmonary edema (HCC) [J81.0] Other ascites [R18.8]  Patient is known to our practice and receives outpatient dialysis treatments at [facility] on a TTS schedule, overseen by [facility] physicians.  Update: Patient seen resting in bed Completed breakfast tray at bedside Room air Pain from left foot   Objective:  Vital signs in last 24 hours:  Temp:  [98.1 F (36.7 C)-99.6 F (37.6 C)] 98.6 F (37 C) (01/29 1141) Pulse Rate:  [71-84] 71 (01/29 1141) Resp:  [18] 18 (01/29 0445) BP: (121-133)/(62-73) 133/71 (01/29 1141) SpO2:  [98 %-100 %] 100 % (01/29 1141) Weight:  [52.5 kg] 52.5 kg (01/29 0500)  Weight change:  Filed Weights   08/31/24 1104 08/31/24 1415 09/01/24 0500  Weight: 79.1 kg 77.2 kg 52.5 kg    Intake/Output: I/O last 3 completed shifts: In: 320 [P.O.:320] Out: 1900 [Other:1900]   Intake/Output this shift:  Total I/O In: 240 [P.O.:240] Out: -   Physical Exam: General: NAD, resting comfortably  Head: Normocephalic  Eyes: Anicteric  Lungs:  normal effort, on room air  Heart: Regular rate  Abdomen:  Soft, nontender  Extremities:  No peripheral edema  Neurologic: Awake, alert  Skin: Warm,dry. Left heel wound  Access: Lt AVF    Basic Metabolic Panel: Recent Labs  Lab 08/29/24 0516 08/30/24 0809 08/31/24 1113 09/01/24 0346  NA 135 132* 134* 137  K 6.9* 6.2* 5.9* 4.9  CL 97* 95* 95* 97*  CO2 25 31 26 31   GLUCOSE 88 84 133* 89  BUN 46* 39* 53* 33*  CREATININE 6.16* 5.55* 6.90* 4.83*  CALCIUM  9.1 8.7* 8.3* 8.6*  MG  3.0*  --   --  2.6*  PHOS 4.7* 4.9* 5.6*  --     Liver Function Tests: Recent Labs  Lab 08/29/24 0516 08/30/24 0809 08/31/24 1113  AST 57*  --   --   ALT 23  --   --   ALKPHOS 211*  --   --   BILITOT 0.7  --   --   PROT 7.3  --   --   ALBUMIN 3.8 3.0* 3.1*   Recent Labs  Lab 08/29/24 0516  LIPASE 73*   No results for input(s): AMMONIA in the last 168 hours.  CBC: Recent Labs  Lab 08/29/24 0516 08/31/24 1113 09/01/24 0346  WBC 5.5 4.8 4.7  NEUTROABS 3.7  --   --   HGB 11.6* 8.3* 8.9*  HCT 37.5* 25.2* 27.8*  MCV 105.3* 101.2* 104.1*  PLT 113* 105* 124*    Cardiac Enzymes: No results for input(s): CKTOTAL, CKMB, CKMBINDEX, TROPONINI in the last 168 hours.  BNP: Invalid input(s): POCBNP  CBG: Recent Labs  Lab 08/29/24 1809  GLUCAP 110*    Microbiology: Results for orders placed or performed during the hospital encounter of 08/29/24  Body fluid culture w Gram Stain     Status: None (Preliminary result)   Collection Time: 08/29/24  2:45 PM   Specimen: PATH Cytology Peritoneal fluid  Result Value Ref Range Status   Specimen Description   Final    PERITONEAL Performed at [facility], [address] [place], [place] [address]    Special Requests   Final    NONE Performed at [facility], [address] [place], [place] [address]    Gram Stain NO WBC SEEN NO ORGANISMS SEEN   Final   Culture   Final    NO GROWTH 3 DAYS Performed at [facility], [address], [place], [place] [address]    Report Status PENDING  Incomplete  Anaerobic culture w Gram Stain     Status: None (Preliminary result)   Collection Time: 08/29/24  2:45 PM   Specimen: Peritoneal Washings  Result Value Ref Range Status   Specimen Description PERITONEAL  Final   Special Requests   Final    NONE Performed at [facility], [address] [place], [place] [address]    Culture   Final    NO ANAEROBES ISOLATED; CULTURE IN PROGRESS  FOR 5 DAYS   Report Status PENDING  Incomplete    Coagulation Studies: No results for input(s): LABPROT, INR in the last 72 hours.    Urinalysis: No results for input(s): COLORURINE, LABSPEC, PHURINE, GLUCOSEU, HGBUR, BILIRUBINUR, KETONESUR, PROTEINUR, UROBILINOGEN, NITRITE, LEUKOCYTESUR in the last 72 hours.  Invalid input(s): APPERANCEUR    Imaging: DG Hand 2 View Left Result Date: 09/01/2024 EXAM: 2 VIEW(S) XRAY OF THE LEFT HAND 09/01/2024 12:55:00 PM COMPARISON: 08/11/2024 CLINICAL HISTORY: Infection. FINDINGS: BONES AND JOINTS: Erosive changes at the third metacarpal head with partial healing stable from previous exam. Persistent subluxation at the third metacarpophalangeal joint. SOFT TISSUES: Soft tissue swelling of the third digit, with overall improvement. Vascular calcifications. IMPRESSION: 1. Erosive changes at the third metacarpal head with partial healing, unchanged. 2. Persistent subluxation at the third metacarpophalangeal joint. 3. Soft tissue swelling of the third digit, improved. Electronically signed by: Taylor Stroud MD 09/01/2024 01:58 PM EST RP Workstation: [id number]      Medications:      acetaminophen   500 mg Oral TID   amLODipine   5 mg Oral Daily   aspirin  EC  81 mg Oral Daily   atorvastatin   10 mg Oral Daily   calcitRIOL   0.5 mcg Oral Daily   Chlorhexidine  Gluconate Cloth  6 each Topical Q0600   clopidogrel   75 mg Oral Daily   feeding supplement (NEPRO CARB STEADY)  237 mL Oral BID BM   gabapentin   200 mg Oral Q T,Th,Sat-1800   heparin   5,000 Units Subcutaneous Q12H   polyethylene glycol  17 g Oral Daily   sevelamer  carbonate  800 mg Oral TID WC   HYDROmorphone , lactulose   Assessment/ Plan:  [name] is a 54 y.o.  male with past medical history including hypertension, neuropathy, chronic liver cirrhosis, portal hypertension, and end-stage renal disease on hemodialysis. Patient seen in ED for  abdominal pain, fever, and nausea. He has been admitted under observation for Hyperkalemia [E87.5] CHF (congestive heart failure) (HCC) [I50.9] Acute pulmonary edema (HCC) [J81.0] Other ascites [R18.8]  [facility]/TTS/Lt AVF  Hyperkalemia with end stage renal disease on hemodialysis.  Potassium improved with dialysis, 4.9. Will perform short treatment tomorrow and get patient back on schedule Saturday.   2.  Acute pulmonary edema, seen on chest x-ray.  Missed last session of dialysis.  Remains on room air  3. Anemia of chronic kidney disease Lab Results  Component Value Date   HGB 8.9 (L) 09/01/2024    Hgb 8.9.  4. Secondary Hyperparathyroidism: with outpatient labs: None  Lab Results  Component  Value Date   PTH 266 (H) 08/14/2024   PTH Comment 08/14/2024   CALCIUM  8.6 (L) 09/01/2024   PHOS 5.6 (H) 08/31/2024    Phosphorus has improved.    LOS: 2 Abrie Egloff 1/29/20262:39 PM   "

## 2024-09-01 NOTE — Consult Note (Addendum)
 NAME: Mario [name]  DOB: January 30, 1971  MRN: [id number]  Date/Time: 09/01/2024 1:56 PM  REQUESTING PROVIDER: Dr.[email or web address] Subjective:  REASON FOR CONSULT: wounds ?spoke to patient with spanish interpreter by [facility] video Mario [name] is a 54 y.o. with a history of ESRD on dialysis, HTN alcohol induced cirrhosis, HTN,PAD, arteritic ulcer left foot who was recently in the hospital between 1/8-1/15 presented to the hospital on 08/29/24 with increasing SOB, abdominal pain, fever , nausea and missing Hemodialysis- HE was last dialyzed on 1/22.  08/29/24 05:02  BP 146/76 !  Temp 98.1 F (36.7 C)  Pulse Rate 72  Resp 18  SpO2 99 %    Latest Reference Range & Units 08/29/24 05:16  WBC 4.0 - 10.5 K/uL 5.5  Hemoglobin 13.0 - 17.0 g/dL 88.3 (L)  HCT 60.9 - 47.9 % 37.5 (L)  Platelets 150 - 400 K/uL 113 (L) [1]  Creatinine 0.61 - 1.24 mg/dL 3.83 (H)    Latest Reference Range & Units 08/29/24 14:45  Color, Fluid  YELLOW  Total Nucleated Cell Count, Fluid 0 - 1,000 cu mm 294  Fluid Type-FCT  CYTO PERI  Lymphs, Fluid % 40  Eos, Fluid % 0  Appearance, Fluid CLEAR  HAZY !  Neutrophil Count, Fluid 0 - 25 % 5   Cr was 10 and K was 6.9  Was given Hyperkalemia cocktail with calcium  gluconate. Lokelma  and albuterol  Rx  I am asked to see the patient for the chronic wounds- one on te left foot and left hand He was to see a hand surgeon as OP for it He has h/o PAD and during his last hospitalization underwent angioplasty of Anterior/tibial dorsalis pedis and also the PTA  Past Medical History:  Diagnosis Date   Abdominal pain 08/28/2016   Acute hyperkalemia 10/28/2016   Chest pain 04/11/2016   Chronic combined systolic and diastolic CHF (congestive heart failure) (HCC)    Cirrhosis (HCC)    Dialysis patient    Edentulous    ESRD (end stage renal disease) on dialysis (HCC) 01/25/2015   Fluid overload 04/12/2016   HTN (hypertension) 10/13/2016   Hyperkalemia 11/27/2016    Hypertension    Mitral regurgitation    Pulmonary edema 07/03/2016   Renal disorder    Renal insufficiency    Scrotal infection 01/25/2015    Past Surgical History:  Procedure Laterality Date   ANTERIOR CERVICAL CORPECTOMY N/A 04/05/2019   Procedure: ANTERIOR CERVICAL CORPECTOMY Cervical six and Cervical seven with strut graft fusion from Cervical five-Thoracic one   ;  Surgeon: Louis Shove, MD;  Location: [facility] OR;  Service: Neurosurgery;  Laterality: N/A;   AV FISTULA PLACEMENT     LOWER EXTREMITY ANGIOGRAPHY Left 08/12/2024   Procedure: Lower Extremity Angiography;  Surgeon: Marea Selinda RAMAN, MD;  Location: [facility] INVASIVE CV LAB;  Service: Cardiovascular;  Laterality: Left;   POSTERIOR CERVICAL FUSION/FORAMINOTOMY N/A 04/05/2019   Procedure: Posterior cervical fusion utilizing segmental instrumentation from Cervical five-Thoracic one ;  Surgeon: Louis Shove, MD;  Location: [facility] OR;  Service: Neurosurgery;  Laterality: N/A;    Social History   Socioeconomic History   Marital status: Single    Spouse name: Not on file   Number of children: Not on file   Years of education: Not on file   Highest education level: Not on file  Occupational History   Not on file  Tobacco Use   Smoking status: Every Day    Current packs/day: 0.25    Average packs/day: 0.3 packs/day  for 25.0 years (6.3 ttl pk-yrs)    Types: Cigarettes   Smokeless tobacco: Never  Substance and Sexual Activity   Alcohol use: No    Comment: pt denies   Drug use: No   Sexual activity: Not Currently  Other Topics Concern   Not on file  Social History Narrative   Not on file   Social Drivers of Health   Tobacco Use: High Risk (08/11/2024)   Patient History    Smoking Tobacco Use: Every Day    Smokeless Tobacco Use: Never    Passive Exposure: Not on file  Financial Resource Strain: Not on file  Food Insecurity: Food Insecurity Present (08/29/2024)   Epic    Worried About Programme Researcher, Broadcasting/film/video in the Last Year: Often true    [employer] of Food in the Last Year: Often true  Transportation Needs: Unmet Transportation Needs (08/29/2024)   Epic    Lack of Transportation (Medical): Yes    Lack of Transportation (Non-Medical): Yes  Physical Activity: Not on file  Stress: Not on file  Social Connections: Moderately Isolated (08/11/2024)   Social Connection and Isolation Panel    Frequency of Communication with Friends and Family: Never    Frequency of Social Gatherings with Friends and Family: Never    Attends Religious Services: 1 to 4 times per year    Active Member of [employer] or Organizations: Yes    Attends Banker Meetings: More than 4 times per year    Marital Status: Never married  Intimate Partner Violence: Not At Risk (08/29/2024)   Epic    Fear of Current or Ex-Partner: No    Emotionally Abused: No    Physically Abused: No    Sexually Abused: No  Depression (PHQ2-9): Not on file  Alcohol Screen: Not on file  Housing: High Risk (08/29/2024)   Epic    Unable to Pay for Housing in the Last Year: Yes    Number of Times Moved in the Last Year: 0    Homeless in the Last Year: No  Utilities: At Risk (08/29/2024)   Epic    Threatened with loss of utilities: Yes  Health Literacy: Not on file    Family History  Problem Relation Age of Onset   Kidney failure Father    Diabetes Mother    Allergies[1] I? Current Facility-Administered Medications  Medication Dose Route Frequency Provider Last Rate Last Admin   acetaminophen  (TYLENOL ) tablet 500 mg  500 mg Oral TID Von Bellis, MD       amLODipine  (NORVASC ) tablet 5 mg  5 mg Oral Daily Zhang, Ping T, MD   5 mg at 09/01/24 9173   aspirin  EC tablet 81 mg  81 mg Oral Daily Zhang, Ping T, MD   81 mg at 09/01/24 9173   atorvastatin  (LIPITOR) tablet 10 mg  10 mg Oral Daily Zhang, Ping T, MD   10 mg at 09/01/24 9173   calcitRIOL  (ROCALTROL ) capsule 0.5 mcg  0.5 mcg Oral Daily Von Bellis, MD   0.5 mcg at 09/01/24 0826   Chlorhexidine  Gluconate Cloth 2 %  PADS 6 each  6 each Topical Q0600 Lateef, Munsoor, MD   6 each at 09/01/24 0625   clopidogrel  (PLAVIX ) tablet 75 mg  75 mg Oral Daily Laurita Manor T, MD   75 mg at 09/01/24 0826   feeding supplement (NEPRO CARB STEADY) liquid 237 mL  237 mL Oral BID BM Laurita Manor DASEN, MD  gabapentin  (NEURONTIN ) capsule 200 mg  200 mg Oral Q T,Th,Sat-1800 Laurita Manor T, MD   200 mg at 08/30/24 1813   heparin  injection 5,000 Units  5,000 Units Subcutaneous Q12H Laurita Manor DASEN, MD   5,000 Units at 09/01/24 9171   HYDROmorphone  (DILAUDID ) tablet 2 mg  2 mg Oral Q6H PRN Von Bellis, MD   2 mg at 09/01/24 9173   lactulose  (CHRONULAC ) 10 GM/15ML solution 30 g  30 g Oral BID PRN Laurita Manor DASEN, MD       polyethylene glycol (MIRALAX  / GLYCOLAX ) packet 17 g  17 g Oral Daily Laurita Manor T, MD       sevelamer  carbonate (RENVELA ) tablet 800 mg  800 mg Oral TID WC Laurita Manor T, MD   800 mg at 09/01/24 1345     Abtx:  Anti-infectives (From admission, onward)    None       REVIEW OF SYSTEMS:  Const: negative fever, negative chills, negative weight loss Eyes: negative diplopia or visual changes, negative eye pain ENT: negative coryza, negative sore throat Resp: negative cough, hemoptysis, has  dyspnea Cards: negative for chest pain, palpitations, lower extremity edema GU: negative for frequency, dysuria and hematuria GI: Negative for abdominal pain, diarrhea, bleeding, constipation Skin: negative for rash and pruritus Heme: negative for easy bruising and gum/nose bleeding MS: pain left foot Neurolo:negative for headaches, dizziness, vertigo, memory problems  Psych: negative for feelings of anxiety, depression  Endocrine: negative for thyroid, diabetes Allergy/Immunology- iodine  ?  Objective:  VITALS:  BP 133/71 (BP Location: Right Arm)   Pulse 71   Temp 98.6 F (37 C) (Oral)   Resp 18   Wt 52.5 kg   SpO2 100%   BMI 20.51 kg/m   PHYSICAL EXAM:  General: Alert, cooperative, no distress, appears  stated age.  Head: Normocephalic, without obvious abnormality, atraumatic. Eyes: Conjunctivae clear, anicteric sclerae. Pupils are equal ENT Nares normal. No drainage or sinus tenderness. Lips, mucosa, and tongue normal. No Thrush Neck: Supple, symmetrical, no adenopathy, thyroid: non tender no carotid bruit and no JVD. Back: No CVA tenderness. Lungs: b/l air entry Heart: Regular rate and rhythm, no murmur, rub or gallop. Abdomen: Soft, non-tender,not distended. Bowel sounds normal. No masses Extremities:   Skin:    No rashes or lesions. Or bruising Lymph: Cervical, supraclavicular normal. Neurologic: Grossly non-focal Pertinent Labs Lab Results CBC    Component Value Date/Time   WBC 4.7 09/01/2024 0346   RBC 2.67 (L) 09/01/2024 0346   HGB 8.9 (L) 09/01/2024 0346   HCT 27.8 (L) 09/01/2024 0346   PLT 124 (L) 09/01/2024 0346   MCV 104.1 (H) 09/01/2024 0346   MCH 33.3 09/01/2024 0346   MCHC 32.0 09/01/2024 0346   RDW 14.2 09/01/2024 0346   LYMPHSABS 0.7 08/29/2024 0516   MONOABS 0.8 08/29/2024 0516   EOSABS 0.3 08/29/2024 0516   BASOSABS 0.1 08/29/2024 0516       Latest Ref Rng & Units 09/01/2024    3:46 AM 08/31/2024   11:13 AM 08/30/2024    8:09 AM  CMP  Glucose 70 - 99 mg/dL 89  866  84   BUN 6 - 20 mg/dL 33  53  39   Creatinine 0.61 - 1.24 mg/dL 5.16  3.09  4.44   Sodium 135 - 145 mmol/L 137  134  132   Potassium 3.5 - 5.1 mmol/L 4.9  5.9  6.2   Chloride 98 - 111 mmol/L 97  95  95  CO2 22 - 32 mmol/L 31  26  31    Calcium  8.9 - 10.3 mg/dL 8.6  8.3  8.7       Microbiology: Recent Results (from the past 240 hours)  Body fluid culture w Gram Stain     Status: None (Preliminary result)   Collection Time: 08/29/24  2:45 PM   Specimen: PATH Cytology Peritoneal fluid  Result Value Ref Range Status   Specimen Description   Final    PERITONEAL Performed at [facility], [address], [place], [place] [address]    Special Requests   Final     NONE Performed at [facility], [address] [place], [place] [address]    Gram Stain NO WBC SEEN NO ORGANISMS SEEN   Final   Culture   Final    NO GROWTH 3 DAYS Performed at [facility], [address] [place], [place] [address]    Report Status PENDING  Incomplete  Anaerobic culture w Gram Stain     Status: None (Preliminary result)   Collection Time: 08/29/24  2:45 PM   Specimen: Peritoneal Washings  Result Value Ref Range Status   Specimen Description PERITONEAL  Final   Special Requests   Final    NONE Performed at [facility], [address], , [place] [address]    Culture   Final    NO ANAEROBES ISOLATED; CULTURE IN PROGRESS FOR 5 DAYS   Report Status PENDING  Incomplete    Lines and Device Date on insertion # of days DC  Central line     Foley     ETT      Patient has: []  acute illness w/systemic sxs  [mod] []  illness posing risk to life or function  [high]  I reviewed:  (3+) []  primary team note []  consultant note(s) []  procedure/op note(s) []  micro result(s)   []  CBC results []  chemistry results []  radiology report(s) []  nursing note(s)  I independently visualized:  (any)   []  cxs/plates in lab []  plain film images []  CT images []  PET images   []  path slide(s) []  ECG tracing []  MRI images []  nuclear scan  I discussed: (any) []  micro and/or path w/lab personnel []  drug options and/or interactions w/ID pharmD   []  procedure/OR findings w/other MD(s) []  echo and/or imaging w/other MD(s)   []  mgm't w/attending(s) involved in case []  setting up home abx w/OPAT team  Mgm't requires: []  prescription drug(s)  [mod] []  intensive toxicity monitoring  [high]   IMAGING RESULTS: Cxr cardiomegaly and pulmonary congestion  I have personally reviewed the films ?  Xray hand- reviewed personally   Erosive changes at the third metacarpal head with partial healing, unchanged.  Persistent subluxation at the third metacarpophalangeal  joint.   Impression/Recommendation ? Pulmonary congestion with acute hypoxic resp failure due to missing dialysis - s/p HD and feeling better  Hyperkalemia due to missing HD- corrected  Chronic left foot ulcer- lateral margin- vascular ulcer Seen by podiatrist- pretty superficial not overtly infected Xray no osteomyelitis Culture done Will not start antibiotic currently  PAD s/p angioplasty of left At/PTA  Left hand - middle finger MCP joint chronic wound Chronic deformity of the finger Inflammatory arthropathy VS infection Though not overtly infected Will need debridement VS amputation  No antibiotics now  ESRD on HD  Anemia secondary to ESRD  Cirrhosis liver Ascites underwent paracentesis - traansudate  Past h/o ETOH use? ? _ Discussed the management  with the patient and hospitalist and podiatrist    ________________________________________________ Note:  This document was prepared using Dragon voice recognition software and may include unintentional dictation errors.     [1]  Allergies Allergen Reactions   Betadine [Povidone Iodine ] Rash   Povidone-Iodine  Dermatitis

## 2024-09-01 NOTE — Plan of Care (Signed)

## 2024-09-02 ENCOUNTER — Other Ambulatory Visit: Payer: Self-pay

## 2024-09-02 DIAGNOSIS — T07XXXA Unspecified multiple injuries, initial encounter: Secondary | ICD-10-CM

## 2024-09-02 LAB — RENAL FUNCTION PANEL
Albumin: 2.9 g/dL — ABNORMAL LOW (ref 3.5–5.0)
Anion gap: 12 (ref 5–15)
BUN: 48 mg/dL — ABNORMAL HIGH (ref 6–20)
CO2: 26 mmol/L (ref 22–32)
Calcium: 8.7 mg/dL — ABNORMAL LOW (ref 8.9–10.3)
Chloride: 94 mmol/L — ABNORMAL LOW (ref 98–111)
Creatinine, Ser: 6.25 mg/dL — ABNORMAL HIGH (ref 0.61–1.24)
GFR, Estimated: 10 mL/min — ABNORMAL LOW
Glucose, Bld: 94 mg/dL (ref 70–99)
Phosphorus: 5.9 mg/dL — ABNORMAL HIGH (ref 2.5–4.6)
Potassium: 5.4 mmol/L — ABNORMAL HIGH (ref 3.5–5.1)
Sodium: 131 mmol/L — ABNORMAL LOW (ref 135–145)

## 2024-09-02 LAB — CBC WITH DIFFERENTIAL/PLATELET
Abs Immature Granulocytes: 0.02 10*3/uL (ref 0.00–0.07)
Basophils Absolute: 0.1 10*3/uL (ref 0.0–0.1)
Basophils Relative: 1 %
Eosinophils Absolute: 0.3 10*3/uL (ref 0.0–0.5)
Eosinophils Relative: 5 %
HCT: 27.6 % — ABNORMAL LOW (ref 39.0–52.0)
Hemoglobin: 9 g/dL — ABNORMAL LOW (ref 13.0–17.0)
Immature Granulocytes: 0 %
Lymphocytes Relative: 12 %
Lymphs Abs: 0.6 10*3/uL — ABNORMAL LOW (ref 0.7–4.0)
MCH: 33.2 pg (ref 26.0–34.0)
MCHC: 32.6 g/dL (ref 30.0–36.0)
MCV: 101.8 fL — ABNORMAL HIGH (ref 80.0–100.0)
Monocytes Absolute: 0.6 10*3/uL (ref 0.1–1.0)
Monocytes Relative: 11 %
Neutro Abs: 3.9 10*3/uL (ref 1.7–7.7)
Neutrophils Relative %: 71 %
Platelets: 160 10*3/uL (ref 150–400)
RBC: 2.71 MIL/uL — ABNORMAL LOW (ref 4.22–5.81)
RDW: 14.4 % (ref 11.5–15.5)
WBC: 5.5 10*3/uL (ref 4.0–10.5)
nRBC: 0 % (ref 0.0–0.2)

## 2024-09-02 LAB — BODY FLUID CULTURE W GRAM STAIN
Culture: NO GROWTH
Gram Stain: NONE SEEN

## 2024-09-02 MED ORDER — VITAMIN D (ERGOCALCIFEROL) 1.25 MG (50000 UNIT) PO CAPS
50000.0000 [IU] | ORAL_CAPSULE | ORAL | 3 refills | Status: AC
  Filled 2024-09-02: qty 5, 35d supply, fill #0

## 2024-09-02 MED ORDER — ACETAMINOPHEN 500 MG PO TABS
500.0000 mg | ORAL_TABLET | Freq: Three times a day (TID) | ORAL | 0 refills | Status: AC | PRN
  Filled 2024-09-02: qty 30, 10d supply, fill #0

## 2024-09-02 MED ORDER — AMOXICILLIN-POT CLAVULANATE 500-125 MG PO TABS
1.0000 | ORAL_TABLET | Freq: Two times a day (BID) | ORAL | Status: DC
  Filled 2024-09-02: qty 1

## 2024-09-02 MED ORDER — POLYETHYLENE GLYCOL 3350 17 GM/SCOOP PO POWD
17.0000 g | Freq: Every day | ORAL | 0 refills | Status: AC
  Filled 2024-09-02: qty 238, 14d supply, fill #0

## 2024-09-02 MED ORDER — ASPIRIN 81 MG PO TBEC
81.0000 mg | DELAYED_RELEASE_TABLET | Freq: Every day | ORAL | 12 refills | Status: AC
  Filled 2024-09-02: qty 30, 30d supply, fill #0

## 2024-09-02 MED ORDER — CLOPIDOGREL BISULFATE 75 MG PO TABS
75.0000 mg | ORAL_TABLET | Freq: Every day | ORAL | 6 refills | Status: AC
  Filled 2024-09-02: qty 30, 30d supply, fill #0

## 2024-09-02 MED ORDER — TRAZODONE HCL 50 MG PO TABS
50.0000 mg | ORAL_TABLET | Freq: Every evening | ORAL | 0 refills | Status: AC | PRN
  Filled 2024-09-02: qty 30, 30d supply, fill #0

## 2024-09-02 MED ORDER — ASCORBIC ACID 500 MG PO TABS
500.0000 mg | ORAL_TABLET | Freq: Two times a day (BID) | ORAL | 0 refills | Status: AC
  Filled 2024-09-02: qty 60, 30d supply, fill #0

## 2024-09-02 MED ORDER — ATORVASTATIN CALCIUM 10 MG PO TABS
10.0000 mg | ORAL_TABLET | Freq: Every day | ORAL | 6 refills | Status: AC
  Filled 2024-09-02: qty 30, 30d supply, fill #0

## 2024-09-02 MED ORDER — AMLODIPINE BESYLATE 5 MG PO TABS
5.0000 mg | ORAL_TABLET | Freq: Every day | ORAL | 2 refills | Status: AC
  Filled 2024-09-02: qty 30, 30d supply, fill #0

## 2024-09-02 MED ORDER — SEVELAMER CARBONATE 800 MG PO TABS
800.0000 mg | ORAL_TABLET | Freq: Three times a day (TID) | ORAL | 3 refills | Status: AC
  Filled 2024-09-02: qty 90, 30d supply, fill #0

## 2024-09-02 MED ORDER — LACTULOSE 10 GM/15ML PO SOLN
30.0000 g | Freq: Two times a day (BID) | ORAL | 0 refills | Status: AC | PRN
  Filled 2024-09-02: qty 236, 3d supply, fill #0

## 2024-09-02 MED ORDER — GABAPENTIN 100 MG PO CAPS
200.0000 mg | ORAL_CAPSULE | ORAL | 1 refills | Status: AC
  Filled 2024-09-02: qty 30, 35d supply, fill #0

## 2024-09-02 NOTE — Plan of Care (Signed)

## 2024-09-02 NOTE — Discharge Summary (Signed)
 [facility] Discharge Summary   Patient: Mario [name] FMW:[id number]  PCP: Lateef, Mario [name]  Date of admission: 08/29/2024   Date of discharge:  09/02/2024     Discharge Diagnoses:  Principal Problem:   Acute pulmonary edema (HCC) Active Problems:   CHF (congestive heart failure) (HCC)   Wounds, multiple   Admitted From: Home Disposition:  Home   Recommendations for Outpatient Follow-up:  Follow-up with PCP in 1 week Follow-up with nephrology and continue hemodialysis as per schedule Follow-up with hand surgery for left hand infection. Follow with podiatry in 1 week Follow up LABS/TEST:     Follow-up Information     Lateef, Mario [name] Follow up in 1 week(s).   Specialty: Nephrology Contact information: [address] Mario [name]                Diet recommendation: Renal diet  Activity: The patient is advised to gradually reintroduce usual activities, as tolerated  Discharge Condition: stable  Code Status: Full code   History of present illness: As per the H and P dictated on admission  Hospital Course: HPI was taken from Dr. Laurita: Mario [name] is a 54 y.o. male with medical history significant of ESRD on HD TTS, liver cirrhosis, HTN, chronic HFpEF, HLD, chronic left third finger infection, mild nonobstructive PVD, presented with multiple complaints including chest pain, increasing shortness of breath, abdominal pain and left foot pain.   Due to snowstorm on weekend, patient missed his Saturday dialysis.  Yesterday evening he started to feel increasing shortness of breath and could not lie flat on the bed, with sharp-like chest pain worsening with deep breath.  He also complained about  episodes of cramping-like abdominal pain but denies any nauseous vomiting or diarrhea.  In addition he also complained about worsening of left foot pain acute on chronic. ED Course: Afebrile, nontachycardic blood  pressure 150/80 O2 saturation 99% on room air.  Chest x-ray showed cardiomegaly and pulmonary congestion, CT abdomen pelvis showed large ascites.  Blood work showed WBC 5.5 hemoglobin 11.6 BUN 46 creatinine 10.1 K6.9 bicarb 25.  EKG showed tented T waves on multiple leads.   Patient was given hyperkalemia cocktail including calcium  gluconate, albuterol  treatment, Lokelma .  Nephrology consulted.     Assessment and Plan:   # Acute on chronic HFpEF: likely secondary to missing HD. Volume management w/ HD.  Improved after hemodialysis.   # ESRD: on HD. Missed HD. Nephro following and recs apprec   # Hyperkalemia: secondary to missed HD. Lokelma  ordered. Needs HD again. Nephro following and recs apprec   # Acute decompensated cirrhosis: w/ ascites. S/p paracentesis on 08/29/24. Continue on lactulose .    # Thrombocytopenia: likely secondary to cirrhosis. No need for a transfusion currently   # Macrocytic anemia: likely secondary to cirrhosis. No need for a transfusion currently    # Chronic left foot pain: w/ hx of nonobstructive PVD. Was worked up on last admission, PVD study with angiogram showed no obstructive but otherwise heavily calcified arteries in lower extremity. Continue on aspirin , plavix , statin & gabapentin   08/11/2024 MRI left foot: 1. Small lateral erosion of the head of the 5th metatarsal with adjacent marrow edema and overlying soft tissue thinning/possible ulceration, raising concern for early osteomyelitis. 2. Trace subcutaneous edema along the dorsum of the forefoot, which can be seen with cellulitis. 3. Mild degenerative arthropathy at the 1st metatarsophalangeal joint. 1/29 x-ray left foot: 1. No acute findings of osteomyelitis.  2. Mild soft tissue swelling of the lateral foot. Podiatry consulted, Wound debridement performed,  Eschar is very sensitive so believe it would be best for enzymatic debridement with santyl . -Applied betadine wet to dry dressing. -Dressing orders placed  - santyl  (if not available then medihoney) to wound, 4x4, abd, kerlix, ace with mild compression.  Change daily or PRN saturation/loosening ID consulted, recommended no antibiotics     # HTN: continue on amlodipine     # History of left hand ulcer: previously found left hand ulcer located on the dorsal third MCP joint has closed. Continue w/ supportive care 1/29 x-ray left hand: 1. Erosive changes at the third metacarpal head with partial healing, unchanged. 2. Persistent subluxation at the third metacarpophalangeal joint. 3. Soft tissue swelling of the third digit, improved. - Patient was recommended to follow-up with hand surgery as an outpatient.  Patient missed appointment due to being in the hospital.  Patient was advised to reschedule appointment.     # Vitamin D  deficiency: Vit D level 18.7, started Calcitrol 0.5 mcg p.o. daily. follow with PCP to repeat vitamin D  level after 3 to 6 months.  # Insomnia, prescribed trazodone  50 mg p.o. nightly as needed  Body mass index is 23.98 kg/m.  Nutrition Interventions:  - Patient was instructed, not to drive, operate heavy machinery, perform activities at heights, swimming or participation in water activities or provide baby sitting services while on Pain, Sleep and Anxiety Medications; until his outpatient Physician has advised to do so again.  - Also recommended to not to take more than prescribed Pain, Sleep and Anxiety Medications.  Patient was ambulatory without any assistance.  On the day of the discharge the patient's vitals were stable, and no other acute medical condition were reported by patient. the patient was felt safe to be discharge at Home.  Consultants: Nephrology, podiatry, ID Procedures: s/p left foot wound debridement S/p Hemodialysis  Discharge Exam: General: Appear in no distress, no Rash; Oral Mucosa Clear, moist. Cardiovascular: S1 and S2 Present, no Murmur, Respiratory: normal respiratory effort, Bilateral Air  entry present and no Crackles, no wheezes Abdomen: Bowel Sound present, Soft and no tenderness, no hernia Extremities: no Pedal edema, no calf tenderness Neurology: alert and oriented to time, place, and person affect appropriate.  Filed Weights   09/02/24 0528 09/02/24 0730 09/02/24 1054  Weight: 62.9 kg 62.9 kg 61.4 kg   Vitals:   09/02/24 1049 09/02/24 1124  BP: (!) 142/61 (!) 158/71  Pulse: 69 71  Resp: 13 14  Temp: 97.6 F (36.4 C) 99.1 F (37.3 C)  SpO2: 100% 100%    DISCHARGE MEDICATION: Allergies as of 09/02/2024       Reactions   Betadine [povidone Iodine ] Rash   Povidone-iodine  Dermatitis        Medication List     STOP taking these medications    prednisoLONE  acetate 1 % ophthalmic suspension Commonly known as: PRED FORTE        TAKE these medications    acetaminophen  500 MG tablet Commonly known as: TYLENOL  Take 1 tablet (500 mg total) by mouth 3 (three) times daily as needed for up to 14 days for mild pain (pain score 1-3), fever or headache (or Fever >/= 101). What changed:  medication strength how much to take when to take this reasons to take this   amLODipine  5 MG tablet Commonly known as: NORVASC  Take 1 tablet (5 mg total) by mouth daily.   ascorbic acid  500 MG tablet Commonly  known as: VITAMIN C  Take 1 tablet (500 mg total) by mouth 2 (two) times daily.   aspirin  EC 81 MG tablet Take 1 tablet (81 mg total) by mouth daily. Swallow whole.   atorvastatin  10 MG tablet Commonly known as: LIPITOR Take 1 tablet (10 mg total) by mouth daily.   clopidogrel  75 MG tablet Commonly known as: PLAVIX  Take 1 tablet (75 mg total) by mouth daily.   gabapentin  100 MG capsule Commonly known as: NEURONTIN  Take 2 capsules (200 mg total) by mouth every Tuesday, Thursday, and Saturday at 6 PM. Start taking on: September 03, 2024   lactulose  10 GM/15ML solution Commonly known as: CHRONULAC  Take 45 mLs (30 g total) by mouth 2 (two) times daily as  needed for mild constipation.   multivitamin Tabs tablet Take 1 tablet by mouth at bedtime.   polyethylene glycol 17 g packet Commonly known as: MIRALAX  / GLYCOLAX  Take 17 g by mouth daily.   sevelamer  carbonate 800 MG tablet Commonly known as: RENVELA  Take 1 tablet (800 mg total) by mouth 3 (three) times daily with meals.   traZODone  50 MG tablet Commonly known as: DESYREL  Take 1 tablet (50 mg total) by mouth at bedtime as needed for sleep.   Vitamin D  (Ergocalciferol ) 1.25 MG (50000 UNIT) Caps capsule Commonly known as: DRISDOL  Take 1 capsule (50,000 Units total) by mouth every 7 (seven) days.               Discharge Care Instructions  (From admission, onward)           Start     Ordered   09/02/24 0000  Discharge wound care:       Comments: As per podiatry   09/02/24 1440           Allergies[1] Discharge Instructions     Call [name] for:  difficulty breathing, headache or visual disturbances   Complete by: As directed    Call [name] for:  extreme fatigue   Complete by: As directed    Call [name] for:  persistant dizziness or light-headedness   Complete by: As directed    Call [name] for:  redness, tenderness, or signs of infection (pain, swelling, redness, odor or green/yellow discharge around incision site)   Complete by: As directed    Call [name] for:  severe uncontrolled pain   Complete by: As directed    Call [name] for:  temperature >100.4   Complete by: As directed    Discharge instructions   Complete by: As directed    Follow-up with PCP in 1 week Follow-up with nephrology and continue hemodialysis as per schedule Follow-up with hand surgery for left hand infection. Follow with podiatry in 1 week   Discharge wound care:   Complete by: As directed    As per podiatry   Increase activity slowly   Complete by: As directed        The results of significant diagnostics from this hospitalization (including imaging, microbiology, ancillary and laboratory) are  listed below for reference.    Significant Diagnostic Studies: DG Foot Complete Left Result Date: 09/01/2024 EXAM: 3 OR MORE VIEW(S) XRAY OF THE LEFT FOOT 09/01/2024 08:37:00 AM COMPARISON: 08/11/2024 CLINICAL HISTORY: Osteomyelitis. ICD10: 142991 Osteomyelitis (HCC). FINDINGS: BONES AND JOINTS: Remote healed fracture deformity of the fifth metatarsal neck. No erosive changes are noted. No malalignment. SOFT TISSUES: Mild soft tissue swelling of the lateral foot. Extensive vascular calcifications. IMPRESSION: 1. No acute findings of osteomyelitis. 2. Mild soft tissue  swelling of the lateral foot. Electronically signed by: Oneil Devonshire [name] 09/01/2024 06:28 PM EST RP Workstation: [id number]   DG Hand 2 View Left Result Date: 09/01/2024 EXAM: 2 VIEW(S) XRAY OF THE LEFT HAND 09/01/2024 12:55:00 PM COMPARISON: 08/11/2024 CLINICAL HISTORY: Infection. FINDINGS: BONES AND JOINTS: Erosive changes at the third metacarpal head with partial healing stable from previous exam. Persistent subluxation at the third metacarpophalangeal joint. SOFT TISSUES: Soft tissue swelling of the third digit, with overall improvement. Vascular calcifications. IMPRESSION: 1. Erosive changes at the third metacarpal head with partial healing, unchanged. 2. Persistent subluxation at the third metacarpophalangeal joint. 3. Soft tissue swelling of the third digit, improved. Electronically signed by: Waddell Calk [name] 09/01/2024 01:58 PM EST RP Workstation: [id number]   US  Paracentesis Result Date: 08/29/2024 INDICATION: Patient with a history of heart failure, end-stage renal disease and cirrhosis with recurrent ascites. Interventional Radiology asked to perform a diagnostic and therapeutic paracentesis. EXAM: ULTRASOUND GUIDED PARACENTESIS MEDICATIONS: 1% lidocaine  10 ml COMPLICATIONS: None immediate. PROCEDURE: Informed written consent was obtained from the patient after a discussion of the risks, benefits and alternatives to treatment. A  timeout was performed prior to the initiation of the procedure. Initial ultrasound scanning demonstrates a large amount of ascites within the right lower abdominal quadrant. The right lower abdomen was prepped and draped in the usual sterile fashion. 1% lidocaine  was used for local anesthesia. Following this, a 19 gauge, 7-cm, Yueh catheter was introduced. An ultrasound image was saved for documentation purposes. The paracentesis was performed. The catheter was removed and a dressing was applied. The patient tolerated the procedure well without immediate post procedural complication. Patient received post-procedure intravenous albumin; see nursing notes for details. FINDINGS: A total of approximately 5.8 L of clear yellow fluid was removed. Samples were sent to the laboratory as requested by the clinical team. IMPRESSION: Successful ultrasound-guided paracentesis yielding 5.8 liters of peritoneal fluid. Procedure performed by: Warren Dais, NP Electronically Signed   By: Wilkie Lent M.D.   On: 08/29/2024 15:52   CT ABDOMEN PELVIS W CONTRAST Result Date: 08/29/2024 EXAM: CT ABDOMEN AND PELVIS WITH CONTRAST 08/29/2024 05:54:01 AM TECHNIQUE: CT of the abdomen and pelvis was performed with the administration of 100 mL of iohexol  (OMNIPAQUE ) 300 MG/ML solution. Multiplanar reformatted images are provided for review. Automated exposure control, iterative reconstruction, and/or weight-based adjustment of the mA/kV was utilized to reduce the radiation dose to as low as reasonably achievable. COMPARISON: 08/11/2024 CLINICAL HISTORY: Abdominal pain, acute, nonlocalized; Bowel obstruction suspected. FINDINGS: LOWER CHEST: Unchanged chronic left pleural thickening and calcification. Mild interstitial edema identified within the visualized lung bases. LIVER: Advanced changes of liver cirrhosis. GALLBLADDER AND BILE DUCTS: Partially decompressed gallbladder. No biliary ductal dilatation. SPLEEN: The spleen is  enlarged, measuring 16.3 cm. No focal splenic lesion identified. Perisplenic varicosities are identified. PANCREAS: No acute abnormality. ADRENAL GLANDS: Unchanged right adrenal nodule measuring 1.3 cm most consistent with a benign adenoma. No follow-up imaging recommended. KIDNEYS, URETERS AND BLADDER: End-stage kidneys with multiple cysts are again noted, unchanged in appearance from the previous exam. Decompressed bladder containing 5 mm stone and circumferential wall thickening, image 75/2. No stones in the ureters. No hydronephrosis. No perinephric or periureteral stranding. GI AND BOWEL: Stomach appears normal. No pathologic dilatation of the bowel loops to suggest bowel obstruction. The appendix is visualized and appears normal. Large hemorrhoid along the right lateral aspect of the rectum noted. PERITONEUM AND RETROPERITONEUM: Large volume of abdominal and pelvic ascites. No free air. No focal fluid collections.  VASCULATURE: Aorta is normal in caliber. Aortic atherosclerosis. Extensive vascular calcifications identified. Large esophageal and retrocrural varicosities are identified. Perisplenic varicosities are also identified. Large pelvic varicosities including large hemorrhoid along the right lateral aspect of the rectum noted. LYMPH NODES: No lymphadenopathy. REPRODUCTIVE ORGANS: Prostate gland is unremarkable. BONES AND SOFT TISSUES: No acute osseous findings. Bony stigmata of renal osteodystrophy. No focal soft tissue abnormality. IMPRESSION: 1. No evidence of bowel obstruction. 2. Large volume abdominal and pelvic ascites, most likely related to portal hypertension from cirrhosis; consider diagnostic paracentesis if not previously established. 3. Cirrhosis with portal hypertension manifested by extensive esophageal/retrocrural/perisplenic/pelvic varices and splenomegaly. 4. Decompressed bladder with 5 mm calculus and circumferential wall thickening, which can be seen with cystitis or chronic outlet  obstruction; urologic evaluation as indicated. Electronically signed by: Waddell Calk [name] 08/29/2024 06:05 AM EST RP Workstation: [id number]   DG Chest Portable 1 View Result Date: 08/29/2024 EXAM: 1 VIEW XRAY OF THE CHEST 08/29/2024 05:13:00 AM COMPARISON: Chest radiographs 10/13/2023 and earlier. CLINICAL HISTORY: 54 year old male with shortness of breath after missed dialysis. FINDINGS: LUNGS AND PLEURA: Diffuse interstitial prominence with perihilar airspace opacities. No focal pulmonary opacity. No pleural effusion. No pneumothorax. HEART AND MEDIASTINUM: Cardiomegaly. Calcified aorta. BONES AND SOFT TISSUES: Cervical spine fusion hardware. No acute osseous abnormality. ABDOMEN: Visible bowel gas pattern is within normal limits. IMPRESSION: 1. Pulmonary interstitial edema. Electronically signed by: Helayne Hurst [name] 08/29/2024 05:26 AM EST RP Workstation: [id number]   US  Paracentesis Result Date: 08/15/2024 INDICATION: 54 year old male. History of CHF, end-stage renal disease and cirrhosis. Found to have ascites. Request is for therapeutic and diagnostic paracentesis. EXAM: ULTRASOUND GUIDED THERAPEUTIC AND DIAGNOSTIC RIGHT-SIDED PARACENTESIS MEDICATIONS: Lidocaine  1% 10 mL COMPLICATIONS: None immediate. PROCEDURE: Informed written consent was obtained from the patient after a discussion of the risks, benefits and alternatives to treatment. A timeout was performed prior to the initiation of the procedure. Initial ultrasound scanning demonstrates a moderate amount of ascites within the right lower abdominal quadrant. The right lower abdomen was prepped and draped in the usual sterile fashion. 1% lidocaine  was used for local anesthesia. Following this, a 19 gauge, 7-cm, Yueh catheter was introduced. An ultrasound image was saved for documentation purposes. The paracentesis was performed. The catheter was removed and a dressing was applied. The patient tolerated the procedure well without immediate post  procedural complication. FINDINGS: A total of approximately 4.4 L of straw color fluid was removed. Samples were sent to the laboratory as requested by the clinical team. IMPRESSION: Successful ultrasound-guided therapeutic and diagnostic right-sided paracentesis yielding 4.4 liters of peritoneal fluid. Performed by Delon Beagle NP PLAN: If the patient eventually requires >/=2 paracenteses in a 30 day period, candidacy for formal evaluation by the [facility] will be assessed. Electronically Signed   By: CHRISTELLA.  Shick M.D.   On: 08/15/2024 11:19   US  ASCITES (ABDOMEN LIMITED) Result Date: 08/13/2024 CLINICAL DATA:  Abdominal distension. EXAM: LIMITED ABDOMEN ULTRASOUND FOR ASCITES TECHNIQUE: Limited ultrasound survey for ascites was performed in all four abdominal quadrants. COMPARISON:  None Available. FINDINGS: Fluid is seen in all 4 quadrants.  Liver margin is irregular. IMPRESSION: 1. Large ascites. 2. Cirrhosis. Electronically Signed   By: Newell Eke M.D.   On: 08/13/2024 14:25   PERIPHERAL VASCULAR CATHETERIZATION Result Date: 08/12/2024 See surgical note for result.  DG Hand Complete Left Result Date: 08/11/2024 EXAM: 3 OR MORE VIEW(S) XRAY OF THE LEFT HAND 08/11/2024 05:18:00 PM COMPARISON: None available. CLINICAL HISTORY: Foreign body (  FB) in soft tissue. FINDINGS: BONES AND JOINTS: No acute fracture. Subluxation of the third metacarpophalangeal joint. Joint space narrowing of the third metacarpophalangeal joint, with erosive changes at the third metacarpal head and base of the third proximal phalanx, consistent with sequela of inflammatory or infectious arthropathy. SOFT TISSUES: Diffuse vascular calcifications. Diffuse soft tissue swelling of the third digit. No definitive radiopaque foreign body identified. IMPRESSION: 1. Diffuse soft tissue swelling of the third digit without a definite radiopaque foreign body. 2. Third  metacarpophalangeal joint subluxation with erosive changes, most consistent with inflammatory or infectious arthropathy. 3. Diffuse vascular calcifications. Electronically signed by: Greig Pique MD [name] 08/11/2024 08:06 PM EST RP Workstation: [id number]   MR HAND LEFT WO CONTRAST Result Date: 08/11/2024 EXAM: MR LEFT HAND WITHOUT INTRAVENOUS CONTRAST 08/11/2024 03:16:04 PM TECHNIQUE: Multiplanar magnetic resonance images of the left hand without intravenous contrast. COMPARISON: None available. CLINICAL HISTORY: Nonhealing wound along the dorsal middle finger MCP joint, reduced range of motion of the middle finger. FINDINGS: LIMITATIONS/ARTIFACTS: Motion artifact is present, reducing diagnostic sensitivity and specificity. SOFT TISSUES: The dorsal soft tissue ulceration along the middle finger MCP joint region is shown on image 25 series 4. Low grade subcutaneous edema in the interosseous muscle adjacent to the distal middle finger metacarpal. JOINTS: Possible erosion of the dorsal base of the proximal phalanx with anterior subluxation of the proximal phalanx with respect to the metacarpal head. BONES: Underlying reported ulceration along the middle finger MCP joint, there is abnormal marrow edema in the base of the proximal phalanx and in the head of the middle finger metacarpal suspicious for active osteomyelitis. IMPRESSION: 1. Abnormal marrow edema in the base of the proximal phalanx and in the head of the middle finger metacarpal, suspicious for active osteomyelitis, with overlying dorsal ulceration. 2. Erosion of the dorsal base of the proximal phalanx middle finger with anterior subluxation of the proximal phalanx with respect to the metacarpal head. 3. Low grade adjacent interosseous muscle and subcutaneous edema. Adjacent to the distal 3rd digit metacarpal. 4. Motion artifact reduces diagnostic sensitivity and specificity. Electronically signed by: Ryan Salvage MD [name] 08/11/2024 03:47 PM EST RP  Workstation: [id number]   MR FOOT LEFT WO CONTRAST Result Date: 08/11/2024 EXAM: MRI of the left Foot without contrast. 08/11/2024 03:00:40 PM TECHNIQUE: Multiplanar multisequence MRI of the left foot was performed without the administration of intravenous contrast. COMPARISON: 08/12/2023. CLINICAL HISTORY: left foot pain for 7 days with clinical suspicion for osteomyelitis. FINDINGS: LIMITATIONS/ARTIFACTS: Motion artifact is present, reducing diagnostic sensitivity and specificity. LISFRANC JOINT: Visualized Lisfranc ligament is intact. No significant Lisfranc interval widening or significant periligamentous edema. BONE MARROW: Small lateral erosion of the head of the 5th metatarsal laterally with adjacent marrow edema for example on image 14 series 8 and image 21 series 5, with thinning of the overlying superficial soft tissues in this vicinity raising the possibility of ulceration. Although the appearance is not classic for osteomyelitis, the presence of the immediately adjacent cutaneous defect in the vicinity of the osseous erosion does raise the possibility of early osteomyelitis in the head of the 5th metatarsal. No acute fracture or aggressive marrow replacing lesion. GREATER AND LESSER MTP JOINTS: Mild degenerative arthropathy at the 1st metatarsophalangeal joint. The 5th metatarsophalangeal joint demonstrates a small lateral erosion of the head of the 5th metatarsal laterally with adjacent marrow edema, as described in the Bone Marrow section. Normal alignment. SOFT TISSUES: Thinning of the overlying superficial soft tissues in the vicinity of the 5th metatarsal head, raising  the possibility of ulceration. Trace subcutaneous edema along the dorsum of the forefoot, cellulitis not excluded. TENDONS: Visualized flexor and extensor tendons are intact. IMPRESSION: 1. Small lateral erosion of the head of the 5th metatarsal with adjacent marrow edema and overlying soft tissue thinning/possible ulceration,  raising concern for early osteomyelitis. 2. Trace subcutaneous edema along the dorsum of the forefoot, which can be seen with cellulitis. 3. Mild degenerative arthropathy at the 1st metatarsophalangeal joint. Electronically signed by: Ryan Salvage MD [name] 08/11/2024 03:40 PM EST RP Workstation: [id number]   CT Angio Aortobifemoral W and/or Wo Contrast Result Date: 08/11/2024 CLINICAL DATA:  Ischemic left foot. End-stage renal disease on hemodialysis. EXAM: CT ANGIOGRAPHY OF ABDOMINAL AORTA WITH ILIOFEMORAL RUNOFF TECHNIQUE: Multidetector CT imaging of the abdomen, pelvis and lower extremities was performed using the standard protocol during bolus administration of intravenous contrast. Multiplanar CT image reconstructions and MIPs were obtained to evaluate the vascular anatomy. RADIATION DOSE REDUCTION: This exam was performed according to the departmental dose-optimization program which includes automated exposure control, adjustment of the mA and/or kV according to patient size and/or use of iterative reconstruction technique. CONTRAST:  OMNIPAQUE  IOHEXOL  350 MG/ML SOLN COMPARISON:  Prior CT abdomen/pelvis 02/03/2022 FINDINGS: VASCULAR Aorta: Normal caliber aorta without aneurysm, dissection, vasculitis or significant stenosis. Celiac: Patent without evidence of aneurysm, dissection, vasculitis or significant stenosis. SMA: Patent without evidence of aneurysm, dissection, vasculitis or significant stenosis. Renals: Hypoplastic renal arteries due to longstanding chronic renal failure and renal atrophy. IMA: Moderate to high-grade stenosis of the proximal IMA. The vessel remains patent. RIGHT Lower Extremity Inflow: Common, internal and external iliac arteries are patent without evidence of aneurysm, dissection, vasculitis or significant stenosis. Outflow: Common, superficial and profunda femoral arteries and the popliteal artery are patent without evidence of aneurysm, dissection, vasculitis or  significant stenosis. Runoff: Limited evaluation of the runoff arteries due to extensive medial arteriosclerosis. The circumferential calcium  in the small vessels limits evaluation of the arterial lumen. LEFT Lower Extremity Inflow: Common, internal and external iliac arteries are patent without evidence of aneurysm, dissection, vasculitis or significant stenosis. Outflow: Common, superficial and profunda femoral arteries and the popliteal artery are patent without evidence of aneurysm, dissection, vasculitis or significant stenosis. Runoff: Limited evaluation of the runoff arteries due to extensive medial arteriosclerosis. The circumferential calcium  in the small vessels limits evaluation of the arterial lumen. Veins: Limited evaluation given arterial phase timing of the exam. There is peripheral calcification of the inferior vena cava and bilateral iliac veins which may represent chronic calcinosis, or potentially sequelae of prior ileo caval DVT. Changes are stable over many years. Review of the MIP images confirms the above findings. NON-VASCULAR Lower chest: No acute abnormality. Mild cardiomegaly. Calcifications along the coronary arteries. Large para esophageal varices. Hepatobiliary: Advanced hepatic cirrhosis. No arterially enhancing lesion to suggest the presence of hepatocellular carcinoma. Gallbladder is unremarkable. No intra or extrahepatic biliary ductal dilatation. Pancreas: Unremarkable. No pancreatic ductal dilatation or surrounding inflammatory changes. Spleen: Mild splenomegaly. Adrenals/Urinary Tract: Unremarkable adrenal glands. Atrophied native kidneys. The bladder is decompressed. Stomach/Bowel: No focal bowel wall thickening or evidence of obstruction. Lymphatic: No suspicious lymphadenopathy. Reproductive: Unremarkable prostate gland. Other: Moderate ascites. Musculoskeletal: No acute fracture or aggressive appearing lytic or blastic osseous lesion. IMPRESSION: VASCULAR 1. No evidence of  hemodynamically significant stenosis or occlusive disease in the inflow (aortoiliac) or outflow (femoropopliteal) segments. Evaluation of the runoff (infrageniculate) arteries is limited by severe medial arteriosclerosis. The circumferential calcification of the medium and small artery walls creates  streak artifact which severely limits evaluation of the internal lumens. Catheter directed angiography could be considered if clinically warranted. 2. Extensive peripheral calcified atherosclerotic plaque. 3. Extensive peripheral calcification of the inferior vena cava and bilateral iliac vessels, similar compared to prior. 4. No evidence of arterial aneurysm or dissection. NON-VASCULAR 1. Severe hepatic cirrhosis with splenomegaly, esophageal and paraesophageal varices and moderate ascites. 2. No acute abnormality within the abdomen or pelvis. 3. Similar appearance of atrophic native kidneys. Electronically Signed   By: Wilkie Lent M.D.   On: 08/11/2024 10:25   US  Venous Img Lower Unilateral Left Result Date: 08/11/2024 EXAM: ULTRASOUND DUPLEX OF THE LEFT LOWER EXTREMITY VEINS TECHNIQUE: Duplex ultrasound using B-mode/gray scaled imaging and Doppler spectral analysis and color flow was obtained of the deep venous structures of the left lower extremity. COMPARISON: None available. CLINICAL HISTORY: 54 year old male with lower extremity pain and color changes. FINDINGS: The common femoral vein, femoral vein, popliteal vein, and visible calf veins demonstrate normal compressibility with normal color flow and spectral analysis. Evidence of calcified peripheral vascular disease. The right contralateral common femoral vein appears patent and unremarkable. IMPRESSION: 1. No evidence of left lower extremity DVT. 2. Suspect  calcified peripheral vascular disease in the left lower extremity. Electronically signed by: Helayne Hurst MD [name] 08/11/2024 09:35 AM EST RP Workstation: [id number]   DG Foot 2 Views Left Result  Date: 08/11/2024 EXAM: 2 VIEW(S) XRAY OF THE LEFT FOOT 08/11/2024 07:32:00 AM COMPARISON: CT left foot 01/22/2018. CLINICAL HISTORY: pain pain FINDINGS: BONES AND JOINTS: Osteopenia. There is a chronic healed fracture deformity of the neck of the 5th metatarsal. No acute fracture. No findings of acute osteomyelitis. No malalignment. Arthritic changes are not seen. SOFT TISSUES: There are extensive vascular calcifications of the distal foreleg and foot. Mild generalized edema. There is mild fusiform thickening in the distal achilles tendon. There is no visible soft tissue gas or foreign body. IMPRESSION: 1. No acute osseous abnormality. 2. Extensive vascular calcifications in the distal foreleg and foot. 3. Soft tissue swelling. 4. Osteopenia. Electronically signed by: Francis Quam [name] 08/11/2024 07:40 AM EST RP Workstation: [id number]    Microbiology: Recent Results (from the past 240 hours)  Body fluid culture w Gram Stain     Status: None   Collection Time: 08/29/24  2:45 PM   Specimen: PATH Cytology Peritoneal fluid  Result Value Ref Range Status   Specimen Description   Final    PERITONEAL Performed at [facility], [address], [place], [place] [address]    Special Requests   Final    NONE Performed at [facility], [address] [place], [place] [address]    Gram Stain NO WBC SEEN NO ORGANISMS SEEN   Final   Culture   Final    NO GROWTH 3 DAYS Performed at [facility], [address] [place], [place] [address]    Report Status 09/02/2024 FINAL  Final  Anaerobic culture w Gram Stain     Status: None (Preliminary result)   Collection Time: 08/29/24  2:45 PM   Specimen: Peritoneal Washings  Result Value Ref Range Status   Specimen Description PERITONEAL  Final   Special Requests   Final    NONE Performed at [facility], [address], [place], [place] [address]    Culture   Final    NO ANAEROBES ISOLATED; CULTURE IN PROGRESS FOR 5 DAYS    Report Status PENDING  Incomplete  Aerobic Culture w  Gram Stain (superficial specimen)     Status: None (Preliminary result)   Collection Time: 09/01/24  6:00 PM   Specimen: Foot  Result Value Ref Range Status   Specimen Description   Final    FOOT Performed at [facility], [address] [place], [place] [address]    Special Requests   Final    NONE Performed at [facility], [address] [place], [place] [address]    Gram Stain   Final    FEW WBC PRESENT, PREDOMINANTLY PMN FEW GRAM POSITIVE COCCI Performed at [facility], [address], [place], [place] [address]    Culture PENDING  Incomplete   Report Status PENDING  Incomplete     Labs: CBC: Recent Labs  Lab 08/29/24 0516 08/31/24 1113 09/01/24 0346 09/02/24 0839  WBC 5.5 4.8 4.7 5.5  NEUTROABS 3.7  --   --  3.9  HGB 11.6* 8.3* 8.9* 9.0*  HCT 37.5* 25.2* 27.8* 27.6*  MCV 105.3* 101.2* 104.1* 101.8*  PLT 113* 105* 124* 160   Basic Metabolic Panel: Recent Labs  Lab 08/29/24 0516 08/30/24 0809 08/31/24 1113 09/01/24 0346 09/02/24 0838  NA 135 132* 134* 137 131*  K 6.9* 6.2* 5.9* 4.9 5.4*  CL 97* 95* 95* 97* 94*  CO2 25 31 26 31 26   GLUCOSE 88 84 133* 89 94  BUN 46* 39* 53* 33* 48*  CREATININE 6.16* 5.55* 6.90* 4.83* 6.25*  CALCIUM  9.1 8.7* 8.3* 8.6* 8.7*  MG 3.0*  --   --  2.6*  --   PHOS 4.7* 4.9* 5.6*  --  5.9*   Liver Function Tests: Recent Labs  Lab 08/29/24 0516 08/30/24 0809 08/31/24 1113 09/02/24 0838  AST 57*  --   --   --   ALT 23  --   --   --   ALKPHOS 211*  --   --   --   BILITOT 0.7  --   --   --   PROT 7.3  --   --   --   ALBUMIN 3.8 3.0* 3.1* 2.9*   Recent Labs  Lab 08/29/24 0516  LIPASE 73*   No results for input(s): AMMONIA in the last 168 hours. Cardiac Enzymes: No results for input(s): CKTOTAL, CKMB, CKMBINDEX, TROPONINI in the last 168 hours. BNP (last 3 results) No results for input(s): BNP in the last 8760  hours. CBG: Recent Labs  Lab 08/29/24 1809  GLUCAP 110*    Time spent: 35 minutes  Signed:  Elvan Sor  [facility] 09/02/2024 2:41 PM      [1]  Allergies Allergen Reactions   Betadine [Povidone Iodine ] Rash   Povidone-Iodine  Dermatitis

## 2024-09-02 NOTE — Progress Notes (Signed)
 " [facility]  ROUNDING NOTE   Subjective:   Mario [name] is a 54 y.o. male with past medical history including hypertension, neuropathy, chronic liver cirrhosis, portal hypertension, and end-stage renal disease on hemodialysis. Patient seen in ED for abdominal pain, fever, and nausea. He has been admitted under observation for Hyperkalemia [E87.5] CHF (congestive heart failure) (HCC) [I50.9] Acute pulmonary edema (HCC) [J81.0] Other ascites [R18.8]  Patient is known to our practice and receives outpatient dialysis treatments at [facility] on a TTS schedule, overseen by [facility] physicians.  Update:  Patient seen and requested during dialysis   HEMODIALYSIS FLOWSHEET:  Blood Flow Rate (mL/min): 0 mL/min Arterial Pressure (mmHg): 17.98 mmHg Venous Pressure (mmHg): 0.4 mmHg TMP (mmHg): 14.75 mmHg Ultrafiltration Rate (mL/min): 879 mL/min Dialysate Flow Rate (mL/min): 300 ml/min  Complaining of foot pain, awaiting medications.    Objective:  Vital signs in last 24 hours:  Temp:  [97.6 F (36.4 C)-98.7 F (37.1 C)] 97.6 F (36.4 C) (01/30 1049) Pulse Rate:  [65-79] 69 (01/30 1049) Resp:  [10-20] 13 (01/30 1049) BP: (119-146)/(61-77) 142/61 (01/30 1049) SpO2:  [100 %] 100 % (01/30 1049) Weight:  [61.4 kg-62.9 kg] 61.4 kg (01/30 1054)  Weight change: -16.2 kg Filed Weights   09/02/24 0528 09/02/24 0730 09/02/24 1054  Weight: 62.9 kg 62.9 kg 61.4 kg    Intake/Output: I/O last 3 completed shifts: In: 560 [P.O.:560] Out: -    Intake/Output this shift:  Total I/O In: -  Out: 1500 [Other:1500]  Physical Exam: General: NAD, resting comfortably  Head: Normocephalic  Eyes: Anicteric  Lungs:  normal effort, on room air  Heart: Regular rate  Abdomen:  Soft, nontender  Extremities:  No peripheral edema  Neurologic: Awake, alert  Skin: Warm,dry. Left heel wound  Access: Lt AVF    Basic Metabolic Panel: Recent Labs  Lab  08/29/24 0516 08/30/24 0809 08/31/24 1113 09/01/24 0346 09/02/24 0838  NA 135 132* 134* 137 131*  K 6.9* 6.2* 5.9* 4.9 5.4*  CL 97* 95* 95* 97* 94*  CO2 25 31 26 31 26   GLUCOSE 88 84 133* 89 94  BUN 46* 39* 53* 33* 48*  CREATININE 6.16* 5.55* 6.90* 4.83* 6.25*  CALCIUM  9.1 8.7* 8.3* 8.6* 8.7*  MG 3.0*  --   --  2.6*  --   PHOS 4.7* 4.9* 5.6*  --  5.9*    Liver Function Tests: Recent Labs  Lab 08/29/24 0516 08/30/24 0809 08/31/24 1113 09/02/24 0838  AST 57*  --   --   --   ALT 23  --   --   --   ALKPHOS 211*  --   --   --   BILITOT 0.7  --   --   --   PROT 7.3  --   --   --   ALBUMIN 3.8 3.0* 3.1* 2.9*   Recent Labs  Lab 08/29/24 0516  LIPASE 73*   No results for input(s): AMMONIA in the last 168 hours.  CBC: Recent Labs  Lab 08/29/24 0516 08/31/24 1113 09/01/24 0346 09/02/24 0839  WBC 5.5 4.8 4.7 5.5  NEUTROABS 3.7  --   --  3.9  HGB 11.6* 8.3* 8.9* 9.0*  HCT 37.5* 25.2* 27.8* 27.6*  MCV 105.3* 101.2* 104.1* 101.8*  PLT 113* 105* 124* 160    Cardiac Enzymes: No results for input(s): CKTOTAL, CKMB, CKMBINDEX, TROPONINI in the last 168 hours.  BNP: Invalid input(s): POCBNP  CBG: Recent Labs  Lab  08/29/24 1809  GLUCAP 110*    Microbiology: Results for orders placed or performed during the hospital encounter of 08/29/24  Body fluid culture w Gram Stain     Status: None   Collection Time: 08/29/24  2:45 PM   Specimen: PATH Cytology Peritoneal fluid  Result Value Ref Range Status   Specimen Description   Final    PERITONEAL Performed at [facility], [address] [place], [place] [address]    Special Requests   Final    NONE Performed at [facility], [address] [place], [place] [address]    Gram Stain NO WBC SEEN NO ORGANISMS SEEN   Final   Culture   Final    NO GROWTH 3 DAYS Performed at [facility], [address], [place], [place] [address]    Report Status 09/02/2024 FINAL  Final   Anaerobic culture w Gram Stain     Status: None (Preliminary result)   Collection Time: 08/29/24  2:45 PM   Specimen: Peritoneal Washings  Result Value Ref Range Status   Specimen Description PERITONEAL  Final   Special Requests   Final    NONE Performed at [facility], [address] [place], [place] [address]    Culture   Final    NO ANAEROBES ISOLATED; CULTURE IN PROGRESS FOR 5 DAYS   Report Status PENDING  Incomplete  Aerobic Culture w Gram Stain (superficial specimen)     Status: None (Preliminary result)   Collection Time: 09/01/24  6:00 PM   Specimen: Foot  Result Value Ref Range Status   Specimen Description   Final    FOOT Performed at [facility], [address], [place], [place] [address]    Special Requests   Final    NONE Performed at [facility], [address], [place], [place] [address]    Gram Stain   Final    FEW WBC PRESENT, PREDOMINANTLY PMN FEW GRAM POSITIVE COCCI Performed at [facility], [address], [place], [place] [address]    Culture PENDING  Incomplete   Report Status PENDING  Incomplete    Coagulation Studies: No results for input(s): LABPROT, INR in the last 72 hours.    Urinalysis: No results for input(s): COLORURINE, LABSPEC, PHURINE, GLUCOSEU, HGBUR, BILIRUBINUR, KETONESUR, PROTEINUR, UROBILINOGEN, NITRITE, LEUKOCYTESUR in the last 72 hours.  Invalid input(s): APPERANCEUR    Imaging: DG Foot Complete Left Result Date: 09/01/2024 EXAM: 3 OR MORE VIEW(S) XRAY OF THE LEFT FOOT 09/01/2024 08:37:00 AM COMPARISON: 08/11/2024 CLINICAL HISTORY: Osteomyelitis. ICD10: 142991 Osteomyelitis (HCC). FINDINGS: BONES AND JOINTS: Remote healed fracture deformity of the fifth metatarsal neck. No erosive changes are noted. No malalignment. SOFT TISSUES: Mild soft tissue swelling of the lateral foot. Extensive vascular calcifications. IMPRESSION: 1. No acute findings of osteomyelitis. 2.  Mild soft tissue swelling of the lateral foot. Electronically signed by: Oneil Devonshire MD 09/01/2024 06:28 PM EST RP Workstation: [id number]   DG Hand 2 View Left Result Date: 09/01/2024 EXAM: 2 VIEW(S) XRAY OF THE LEFT HAND 09/01/2024 12:55:00 PM COMPARISON: 08/11/2024 CLINICAL HISTORY: Infection. FINDINGS: BONES AND JOINTS: Erosive changes at the third metacarpal head with partial healing stable from previous exam. Persistent subluxation at the third metacarpophalangeal joint. SOFT TISSUES: Soft tissue swelling of the third digit, with overall improvement. Vascular calcifications. IMPRESSION: 1. Erosive changes at the third metacarpal head with partial healing, unchanged. 2. Persistent subluxation at the third metacarpophalangeal joint. 3. Soft tissue swelling  of the third digit, improved. Electronically signed by: Taylor Stroud MD 09/01/2024 01:58 PM EST RP Workstation: [id number]      Medications:      acetaminophen   500 mg Oral TID   amLODipine   5 mg Oral Daily   aspirin  EC  81 mg Oral Daily   atorvastatin   10 mg Oral Daily   calcitRIOL   0.5 mcg Oral Daily   Chlorhexidine  Gluconate Cloth  6 each Topical Q0600   clopidogrel   75 mg Oral Daily   collagenase    Topical Daily   feeding supplement (NEPRO CARB STEADY)  237 mL Oral BID BM   gabapentin   200 mg Oral Q T,Th,Sat-1800   heparin   5,000 Units Subcutaneous Q12H   hydrocerin   Topical BID   polyethylene glycol  17 g Oral Daily   sevelamer  carbonate  800 mg Oral TID WC   HYDROmorphone , lactulose   Assessment/ Plan:  [name] is a 54 y.o.  male with past medical history including hypertension, neuropathy, chronic liver cirrhosis, portal hypertension, and end-stage renal disease on hemodialysis. Patient seen in ED for abdominal pain, fever, and nausea. He has been admitted under observation for Hyperkalemia [E87.5] CHF (congestive heart failure) (HCC) [I50.9] Acute pulmonary edema (HCC) [J81.0] Other ascites  [R18.8]  [facility] N /TTS/Lt AVF  Hyperkalemia with end stage renal disease on hemodialysis. Receiving dialysis today, tolerating well. Next treatment scheduled for Saturday to maintain outpatient schedule  2.  Acute pulmonary edema, seen on chest x-ray.  Missed last session of dialysis.  Room air with no signs of shortness of breath  3. Anemia of chronic kidney disease Lab Results  Component Value Date   HGB 9.0 (L) 09/02/2024    Hgb 9.0, will continue to monitor and assess need for ESA.   4. Secondary Hyperparathyroidism: with outpatient labs: None  Lab Results  Component Value Date   PTH 266 (H) 08/14/2024   PTH Comment 08/14/2024   CALCIUM  8.7 (L) 09/02/2024   PHOS 5.9 (H) 09/02/2024    Will continue to monitor bone minerals during this admission.    LOS: 3 Daphne Karrer 1/30/202611:14 AM   "

## 2024-09-02 NOTE — Progress Notes (Signed)
" °   09/02/24 1049  Vitals  Temp 97.6 F (36.4 C)  Temp Source Oral  BP (!) 142/61  MAP (mmHg) 81  BP Location Right Arm  BP Method Automatic  Patient Position (if appropriate) Lying  Pulse Rate 69  Pulse Rate Source Monitor  ECG Heart Rate 68  Resp 13  Oxygen Therapy  SpO2 100 %  O2 Device Room Air  Patient Activity (if Appropriate) In bed  Pulse Oximetry Type Continuous  Oximetry Probe Site Changed No  During Treatment Monitoring  Blood Flow Rate (mL/min) 0 mL/min  Arterial Pressure (mmHg) 21.82 mmHg  Venous Pressure (mmHg) -35.15 mmHg  TMP (mmHg) 14.75 mmHg  Ultrafiltration Rate (mL/min) 879 mL/min  Dialysate Flow Rate (mL/min) 299 ml/min  Duration of HD Treatment -hour(s) 2.5 hour(s)  Cumulative Fluid Removed (mL) per Treatment  1500.13  HD Safety Checks Performed Yes  Intra-Hemodialysis Comments Tx completed  Post Treatment  Dialyzer Clearance Lightly streaked  Hemodialysis Intake (mL) 0 mL  Liters Processed 60  Fluid Removed (mL) 1500 mL  Tolerated HD Treatment Yes  Fistula / Graft Left Upper arm Arteriovenous fistula  No Placement Date or Time found.   Orientation: Left  Access Location: Upper arm  Access Type: Arteriovenous fistula  Site Condition No complications  Fistula / Graft Assessment Present;Thrill;Bruit  Status Deaccessed;Flushed;Patent  Drainage Description None    "

## 2024-09-02 NOTE — Progress Notes (Signed)
 D/C order noted. Contacted [facility]  to be advised of pt and d/c today. Requested documents faxed to clinic for continuation of care.  Suzen Satchel Dialysis Navigator [phone]

## 2024-09-02 NOTE — Progress Notes (Signed)
 Pt refused to his dilaudid  after asking for pain medication.

## 2024-09-02 NOTE — TOC Transition Note (Signed)
 Transition of Care [facility]) - Discharge Note   Patient Details  Name: Mario [name] MRN: [id number] Date of Birth: [birth year]  [facility] CM/SW Contact:  Racheal LITTIE Schimke, RN Phone Number: 09/02/2024, 3:53 PM   Clinical Narrative: Venia voucher given to patient. Wait time one hour per Richelle Ee.      Final next level of care: Home/Self Care Barriers to Discharge: Barriers Resolved   Patient Goals and CMS Choice            Discharge Placement                    Patient and family notified of of transfer: 09/02/24  Discharge Plan and Services Additional resources added to the After Visit Summary for                  DME Arranged: N/A DME Agency: NA       HH Arranged: NA HH Agency: NA        Social Drivers of Health (SDOH) Interventions SDOH Screenings   Food Insecurity: Food Insecurity Present (08/29/2024)  Housing: High Risk (08/29/2024)  Transportation Needs: Unmet Transportation Needs (08/29/2024)  Utilities: At Risk (08/29/2024)  Social Connections: Moderately Isolated (08/11/2024)  Tobacco Use: High Risk (08/11/2024)     Readmission Risk Interventions    09/01/2024   12:57 PM  Readmission Risk Prevention Plan  Transportation Screening Complete  PCP or Specialist Appt within 3-5 Days --  HRI or Home Care Consult Complete  Social Work Consult for Recovery Care Planning/Counseling Complete  Palliative Care Screening Complete  Medication Review Oceanographer) Complete

## 2024-09-02 NOTE — Plan of Care (Signed)
  Problem: Clinical Measurements: Goal: Ability to maintain clinical measurements within normal limits will improve Outcome: Progressing   Problem: Pain Managment: Goal: General experience of comfort will improve and/or be controlled Outcome: Progressing   Problem: Safety: Goal: Ability to remain free from injury will improve Outcome: Progressing

## 2024-09-06 LAB — ANAEROBIC CULTURE W GRAM STAIN

## 2024-09-06 LAB — AEROBIC CULTURE W GRAM STAIN (SUPERFICIAL SPECIMEN)
